# Patient Record
Sex: Male | Born: 1955 | Race: White | Hispanic: No | Marital: Married | Smoking: Never smoker
Health system: Southern US, Community
[De-identification: ages and names within clinical notes are randomized; demographics above are authoritative.]

## PROBLEM LIST (undated history)

## (undated) DIAGNOSIS — I10 Essential (primary) hypertension: Secondary | ICD-10-CM

## (undated) DIAGNOSIS — C9000 Multiple myeloma not having achieved remission: Secondary | ICD-10-CM

## (undated) DIAGNOSIS — E119 Type 2 diabetes mellitus without complications: Secondary | ICD-10-CM

## (undated) DIAGNOSIS — E7800 Pure hypercholesterolemia, unspecified: Secondary | ICD-10-CM

## (undated) DIAGNOSIS — I4891 Unspecified atrial fibrillation: Secondary | ICD-10-CM

## (undated) HISTORY — DX: Unspecified atrial fibrillation: I48.91

## (undated) HISTORY — DX: Multiple myeloma not having achieved remission: C90.00

## (undated) HISTORY — DX: Pure hypercholesterolemia, unspecified: E78.00

---

## 2004-11-07 ENCOUNTER — Ambulatory Visit: Payer: Self-pay | Admitting: Internal Medicine

## 2005-01-20 ENCOUNTER — Ambulatory Visit: Payer: Self-pay | Admitting: Internal Medicine

## 2009-10-03 ENCOUNTER — Emergency Department: Payer: Self-pay

## 2011-05-14 ENCOUNTER — Emergency Department: Payer: Self-pay | Admitting: Emergency Medicine

## 2017-10-27 ENCOUNTER — Emergency Department
Admission: EM | Admit: 2017-10-27 | Discharge: 2017-10-28 | Disposition: A | Discharge status: Home / Self Care | Payer: BLUE CROSS/BLUE SHIELD | Attending: Emergency Medicine | Admitting: Emergency Medicine

## 2017-10-27 ENCOUNTER — Emergency Department: Payer: BLUE CROSS/BLUE SHIELD

## 2017-10-27 ENCOUNTER — Other Ambulatory Visit: Payer: Self-pay

## 2017-10-27 DIAGNOSIS — J101 Influenza due to other identified influenza virus with other respiratory manifestations: Secondary | ICD-10-CM | POA: Insufficient documentation

## 2017-10-27 DIAGNOSIS — R2681 Unsteadiness on feet: Secondary | ICD-10-CM | POA: Diagnosis not present

## 2017-10-27 DIAGNOSIS — E869 Volume depletion, unspecified: Secondary | ICD-10-CM

## 2017-10-27 DIAGNOSIS — R509 Fever, unspecified: Secondary | ICD-10-CM | POA: Diagnosis not present

## 2017-10-27 DIAGNOSIS — I10 Essential (primary) hypertension: Secondary | ICD-10-CM | POA: Insufficient documentation

## 2017-10-27 DIAGNOSIS — R823 Hemoglobinuria: Secondary | ICD-10-CM | POA: Diagnosis not present

## 2017-10-27 DIAGNOSIS — E119 Type 2 diabetes mellitus without complications: Secondary | ICD-10-CM | POA: Diagnosis not present

## 2017-10-27 DIAGNOSIS — Z9181 History of falling: Secondary | ICD-10-CM | POA: Diagnosis not present

## 2017-10-27 DIAGNOSIS — R05 Cough: Secondary | ICD-10-CM | POA: Diagnosis present

## 2017-10-27 DIAGNOSIS — R531 Weakness: Secondary | ICD-10-CM

## 2017-10-27 HISTORY — DX: Essential (primary) hypertension: I10

## 2017-10-27 HISTORY — DX: Type 2 diabetes mellitus without complications: E11.9

## 2017-10-27 LAB — CBC
HEMATOCRIT: 41.7 % (ref 40.0–52.0)
Hemoglobin: 14.2 g/dL (ref 13.0–18.0)
MCH: 30.1 pg (ref 26.0–34.0)
MCHC: 34 g/dL (ref 32.0–36.0)
MCV: 88.6 fL (ref 80.0–100.0)
Platelets: 192 10*3/uL (ref 150–440)
RBC: 4.7 MIL/uL (ref 4.40–5.90)
RDW: 13.2 % (ref 11.5–14.5)
WBC: 5.1 10*3/uL (ref 3.8–10.6)

## 2017-10-27 LAB — URINALYSIS, COMPLETE (UACMP) WITH MICROSCOPIC
BACTERIA UA: NONE SEEN
BILIRUBIN URINE: NEGATIVE
Glucose, UA: NEGATIVE mg/dL
KETONES UR: 20 mg/dL — AB
Leukocytes, UA: NEGATIVE
NITRITE: NEGATIVE
PH: 5 (ref 5.0–8.0)
Protein, ur: 100 mg/dL — AB
Specific Gravity, Urine: 1.018 (ref 1.005–1.030)

## 2017-10-27 LAB — BASIC METABOLIC PANEL
ANION GAP: 12 (ref 5–15)
BUN: 28 mg/dL — ABNORMAL HIGH (ref 6–20)
CHLORIDE: 96 mmol/L — AB (ref 101–111)
CO2: 22 mmol/L (ref 22–32)
Calcium: 9.1 mg/dL (ref 8.9–10.3)
Creatinine, Ser: 1.24 mg/dL (ref 0.61–1.24)
GFR calc non Af Amer: >60 mL/min (ref >60)
Glucose, Bld: 181 mg/dL — ABNORMAL HIGH (ref 65–99)
POTASSIUM: 4.5 mmol/L (ref 3.5–5.1)
Sodium: 130 mmol/L — ABNORMAL LOW (ref 135–145)

## 2017-10-27 LAB — GLUCOSE, CAPILLARY: Glucose-Capillary: 166 mg/dL — ABNORMAL HIGH (ref 65–99)

## 2017-10-27 LAB — TROPONIN I

## 2017-10-27 NOTE — ED Triage Notes (Signed)
Pt states he fell 3 times today and that it was because his blood pressure and blood sugar were high - he states he has also had a cough with congestion that his PCP called in an ATB for that he is currently taking - the pt had unsteady gait when walking to the triage room and almost fell

## 2017-10-28 ENCOUNTER — Emergency Department: Payer: BLUE CROSS/BLUE SHIELD

## 2017-10-28 LAB — INFLUENZA PANEL BY PCR (TYPE A & B)
INFLAPCR: POSITIVE — AB
Influenza B By PCR: NEGATIVE

## 2017-10-28 MED ORDER — SODIUM CHLORIDE 0.9 % IV BOLUS (SEPSIS)
500.0000 mL | INTRAVENOUS | Status: AC
  Administered 2017-10-28: 500 mL via INTRAVENOUS

## 2017-10-28 NOTE — ED Provider Notes (Signed)
[facility] Emergency Department Provider Note  ____________________________________________   First MD Initiated Contact with Patient 10/28/17 0018     (approximate)  I have reviewed the triage vital signs and the nursing notes.   HISTORY  Chief Complaint Fall; Hypertension; and Hyperglycemia    HPI Victor Phillips is a 62 y.o. male who presents for evaluation of a variety of complaints.  He and his wife state that for the last several days he has had a cough, nasal congestion, runny nose, and general malaise.  He had a fever in triage and states that he has felt some fever and chills at home.  He has felt off balance for the last couple of days and today he states that he fell or lost his balance 3 times but he believes that is because his blood sugar and his blood pressure were high.  His primary care doctor called in a prescription for Augmentin because of the cough and he took 1 dose this afternoon.  He states that he feels better now and is lying in the bed in no acute distress with his hands behind his head, joking and interacting with me appropriately.  He denies chest pain, shortness of breath, abdominal pain, nausea, vomiting, and dysuria.  He has had hiccups since taking his first dose of Augmentin which was approximately 8 hours ago.  He has felt off balance for a couple of days but has had no focal numbness nor weakness in any of his extremities and has not had any difficulty speaking and no confusion according to him and his wife.  Past Medical History:  Diagnosis Date  . Diabetes mellitus without complication (HCC)   . Hypertension     There are no active problems to display for this patient.   History reviewed. No pertinent surgical history.  Prior to Admission medications   Not on File    Allergies Patient has no known allergies.  No family history on file.  Social History Social History   Tobacco Use  . Smoking status: Never  Smoker  . Smokeless tobacco: Never Used  Substance Use Topics  . Alcohol use: No    Frequency: Never  . Drug use: No    Review of Systems Constitutional: +fever/chills Eyes: No visual changes. ENT: No sore throat.  Nasal congestion and runny nose for several days Cardiovascular: Denies chest pain. Respiratory: Denies shortness of breath.  Frequent mild cough Gastrointestinal: No abdominal pain.  No nausea, no vomiting.  No diarrhea.  No constipation. Genitourinary: Negative for dysuria. Musculoskeletal: Negative for neck pain.  Negative for back pain. Integumentary: Negative for rash. Neurological: Patient has felt off balance for a couple of days and had some non-injurous falls.  Negative for headaches, focal weakness or numbness.   ____________________________________________   PHYSICAL EXAM:  VITAL SIGNS: ED Triage Vitals  Enc Vitals Group     BP 10/27/17 1913 (!) 157/87     Pulse Rate 10/27/17 1913 (!) 119     Resp 10/27/17 1913 (!) 22     Temp 10/27/17 1913 (!) 102.6 F (39.2 C)     Temp Source 10/27/17 1913 Oral     SpO2 10/27/17 1913 96 %     Weight 10/27/17 1914 80.7 kg (178 lb)     Height 10/27/17 1914 1.753 m (5\' 9" )     Head Circumference --      Peak Flow --      Pain Score 10/27/17 1913 5  Pain Loc --      Pain Edu? --      Excl. in GC? --     Constitutional: Alert and oriented. Well appearing and in no acute distress. Eyes: Conjunctivae are normal. PERRL. EOMI. Head: Atraumatic. Nose: +congestion/rhinnorhea. Mouth/Throat: Mucous membranes are moist.  Oropharynx non-erythematous. Neck: No stridor.  No meningeal signs.   Cardiovascular: Borderline tachycardia regular rhythm. Good peripheral circulation. Grossly normal heart sounds. Respiratory: Normal respiratory effort.  No retractions. Lungs CTAB. Gastrointestinal: Soft and nontender. No distention.  Musculoskeletal: No lower extremity tenderness nor edema. No gross deformities of  extremities. Neurologic:  Normal speech and language. No gross focal neurologic deficits are appreciated.  Skin:  Skin is warm, dry and intact. No rash noted.  He has a healing wound on his right hand that he states was a spider bite but there is no sign of active infection at this time Psychiatric: Mood and affect are normal. Speech and behavior are normal.  ____________________________________________   LABS (all labs ordered are listed, but only abnormal results are displayed)  Labs Reviewed  GLUCOSE, CAPILLARY - Abnormal; Notable for the following components:      Result Value   Glucose-Capillary 166 (*)    All other components within normal limits  BASIC METABOLIC PANEL - Abnormal; Notable for the following components:   Sodium 130 (*)    Chloride 96 (*)    Glucose, Bld 181 (*)    BUN 28 (*)    All other components within normal limits  URINALYSIS, COMPLETE (UACMP) WITH MICROSCOPIC - Abnormal; Notable for the following components:   Color, Urine YELLOW (*)    APPearance CLEAR (*)    Hgb urine dipstick MODERATE (*)    Ketones, ur 20 (*)    Protein, ur 100 (*)    Squamous Epithelial / LPF 0-5 (*)    All other components within normal limits  INFLUENZA PANEL BY PCR (TYPE A & B) - Abnormal; Notable for the following components:   Influenza A By PCR POSITIVE (*)    All other components within normal limits  CBC  TROPONIN I  CBG MONITORING, ED   ____________________________________________  EKG  ED ECG REPORT I, Loleta Roseory Assunta Pupo, the attending physician, personally viewed and interpreted this ECG.  Date: 10/27/2017 EKG Time: 19:25 Rate: 117 Rhythm: sinus tachycardia QRS Axis: normal Intervals: normal ST/T Wave abnormalities: Non-specific ST segment / T-wave changes, but no evidence of acute ischemia. Narrative Interpretation: no evidence of acute ischemia, some artifact in V3   ____________________________________________  RADIOLOGY I, Loleta Roseory Anvita Hirata, personally  viewed and evaluated these images (plain radiographs) as part of my medical decision making, as well as reviewing the written report by the radiologist.  ED MD interpretation:  No acute infiltrates  Official radiology report(s): Dg Chest 2 View  Result Date: 10/27/2017 CLINICAL DATA:  Fall. EXAM: CHEST  2 VIEW COMPARISON:  10/03/2009 FINDINGS: The lungs are clear without focal pneumonia, edema, pneumothorax or pleural effusion. The cardiopericardial silhouette is within normal limits for size. The visualized bony structures of the thorax are intact. IMPRESSION: No active cardiopulmonary disease. Electronically Signed   By: Kennith CenterEric  Mansell M.D.   On: 10/27/2017 20:24   Ct Head Wo Contrast  Result Date: 10/28/2017 CLINICAL DATA:  Larey SeatFell 3 times today. Unsteady gait. Hypertensive and hyperglycemic. Assess for stroke. History of hypertension and diabetes. EXAM: CT HEAD WITHOUT CONTRAST TECHNIQUE: Contiguous axial images were obtained from the base of the skull through the vertex without intravenous contrast.  COMPARISON:  CT HEAD October 03, 2009 FINDINGS: BRAIN: No intraparenchymal hemorrhage, mass effect nor midline shift. Borderline parenchymal brain volume loss. No hydrocephalus. Minimal supratentorial white matter hypodensities within normal range for patient's age, though non-specific are most compatible with chronic small vessel ischemic disease. No acute large vascular territory infarcts. No abnormal extra-axial fluid collections. Basal cisterns are patent. VASCULAR: Mild calcific atherosclerosis of the carotid siphons. SKULL: No skull fracture. Old nasal bone fractures. No significant scalp soft tissue swelling. SINUSES/ORBITS: The mastoid air-cells and included paranasal sinuses are well-aerated.The included ocular globes and orbital contents are non-suspicious. OTHER: None. IMPRESSION: 1. No acute intracranial process. 2. Borderline parenchymal brain volume loss. Electronically Signed   By: Awilda Metro M.D.   On: 10/28/2017 01:09    ____________________________________________   PROCEDURES  Critical Care performed: No   Procedure(s) performed:   Procedures   ____________________________________________   INITIAL IMPRESSION / ASSESSMENT AND PLAN / ED COURSE  As part of my medical decision making, I reviewed the following data within the electronic MEDICAL RECORD NUMBER Nursing notes reviewed and incorporated, Labs reviewed , EKG interpreted , Radiograph reviewed  and Notes from prior ED visits    Differential diagnosis includes, but is not limited to, CVA, acute infection such as pneumonia, UTI, or viral infection including influenza, ACS, intracranial hemorrhage.  The patient is very well-appearing at this time once his fever came down but is still borderline tachycardic.  He has no acute infiltrate on his chest x-ray and his lungs are clear.  He has the hiccups but this started after taking some Augmentin.  He has no chest pain or shortness of breath.  He could have been ataxic due to some volume depletion and viral illness/fever, but I will check a CT scan of the head; he has no focal neuro deficits at this time, and if he has had symptoms for several days due to a CVA, I would anticipate seeing some abnormality on his CT.  If after a small IV fluid bolus and influenza testing he remains ataxic when we test him, he may benefit from an MRI, but I do not think it is necessary at this time.  The patient and his wife are comfortable with the plan for outpatient follow-up given a generally reassuring workup with a metabolic panel notable for mild hyponatremia and slightly elevated BUN which I think is related to volume depletion and some ketones in his urine.  Again I think that he will benefit from fluids.  He does have some hemoglobin in his urine and I will recommend that he follow-up with urology but he has no abdominal pain or flank pain to suggest renal colic or  ureterolithiasis.  Clinical Course as of Oct 28 136  Fri Oct 28, 2017  0124 Influenza A By PCR: (!) POSITIVE [CF]  0125 No acute intracranial process CT Head Wo Contrast [CF]  0125 Strongly suspect mild volume depletion and +Flu as the cause of the patient's symptoms.  Updated patient and wife, gave usual recommendations/return precautions.  They understand and agree with the plan.  [CF]    Clinical Course User Index [CF] Loleta Rose, MD    ____________________________________________  FINAL CLINICAL IMPRESSION(S) / ED DIAGNOSES  Final diagnoses:  Fever, unspecified fever cause  Hemoglobinuria  Influenza A  Generalized weakness  Volume depletion     MEDICATIONS GIVEN DURING THIS VISIT:  Medications  sodium chloride 0.9 % bolus 500 mL (500 mLs Intravenous New Bag/Given 10/28/17 0044)  ED Discharge Orders    None       Note:  This document was prepared using Dragon voice recognition software and may include unintentional dictation errors.    Loleta Rose, MD 10/28/17 [phone]

## 2017-10-28 NOTE — Discharge Instructions (Signed)
You were diagnosed with the flu (influenza).  You will feel ill for as much as a few weeks.  Please take any prescribed medications as instructed, and you may use over-the-counter Tylenol and/or ibuprofen as needed according to label instructions (unless you have an allergy to either or have been told by your doctor not to take them).  Please make sure to drink plenty of fluids and refer to the included information about rehydration.  Follow up with your physician as instructed above, and return to the Emergency Department (ED) if you are unable to tolerate fluids due to vomiting, have worsening trouble breathing, become extremely tired or difficult to awaken, or if you develop any other symptoms that concern you. 

## 2017-10-28 NOTE — ED Notes (Addendum)
Pt states he was sent here from work because of his blood pressure and blood sugar. Pt had a temp of 102.6 on arrival. He has a treated spider bite on his right hand that is swollen and red. Pt states he was on 2 rounds of abx for that about 1 month ago. Temp now is 99.8  Pt has had hiccups since 4pm 2/14

## 2018-01-25 ENCOUNTER — Emergency Department: Payer: BLUE CROSS/BLUE SHIELD

## 2018-01-25 ENCOUNTER — Other Ambulatory Visit: Payer: Self-pay

## 2018-01-25 ENCOUNTER — Emergency Department
Admission: EM | Admit: 2018-01-25 | Discharge: 2018-01-26 | Disposition: A | Discharge status: Home / Self Care | Payer: BLUE CROSS/BLUE SHIELD | Attending: Emergency Medicine | Admitting: Emergency Medicine

## 2018-01-25 DIAGNOSIS — I10 Essential (primary) hypertension: Secondary | ICD-10-CM | POA: Diagnosis not present

## 2018-01-25 DIAGNOSIS — R1110 Vomiting, unspecified: Secondary | ICD-10-CM | POA: Diagnosis not present

## 2018-01-25 DIAGNOSIS — R51 Headache: Secondary | ICD-10-CM | POA: Insufficient documentation

## 2018-01-25 DIAGNOSIS — E119 Type 2 diabetes mellitus without complications: Secondary | ICD-10-CM | POA: Diagnosis not present

## 2018-01-25 DIAGNOSIS — R2681 Unsteadiness on feet: Secondary | ICD-10-CM | POA: Diagnosis not present

## 2018-01-25 DIAGNOSIS — R4182 Altered mental status, unspecified: Secondary | ICD-10-CM | POA: Diagnosis present

## 2018-01-25 DIAGNOSIS — R410 Disorientation, unspecified: Secondary | ICD-10-CM | POA: Insufficient documentation

## 2018-01-25 LAB — COMPREHENSIVE METABOLIC PANEL
ALBUMIN: 4.1 g/dL (ref 3.5–5.0)
ALK PHOS: 84 U/L (ref 38–126)
ALT: 26 U/L (ref 17–63)
ANION GAP: 8 (ref 5–15)
AST: 21 U/L (ref 15–41)
BILIRUBIN TOTAL: 0.4 mg/dL (ref 0.3–1.2)
BUN: 27 mg/dL — ABNORMAL HIGH (ref 6–20)
CALCIUM: 9.5 mg/dL (ref 8.9–10.3)
CO2: 27 mmol/L (ref 22–32)
Chloride: 99 mmol/L — ABNORMAL LOW (ref 101–111)
Creatinine, Ser: 1.21 mg/dL (ref 0.61–1.24)
GFR calc Af Amer: >60 mL/min (ref >60)
GFR calc non Af Amer: >60 mL/min (ref >60)
GLUCOSE: 256 mg/dL — AB (ref 65–99)
Potassium: 4.1 mmol/L (ref 3.5–5.1)
SODIUM: 134 mmol/L — AB (ref 135–145)
TOTAL PROTEIN: 8 g/dL (ref 6.5–8.1)

## 2018-01-25 LAB — CBC
HCT: 39.6 % — ABNORMAL LOW (ref 40.0–52.0)
HEMOGLOBIN: 13.9 g/dL (ref 13.0–18.0)
MCH: 30.7 pg (ref 26.0–34.0)
MCHC: 35.2 g/dL (ref 32.0–36.0)
MCV: 87.4 fL (ref 80.0–100.0)
Platelets: 224 10*3/uL (ref 150–440)
RBC: 4.53 MIL/uL (ref 4.40–5.90)
RDW: 13.5 % (ref 11.5–14.5)
WBC: 9.2 10*3/uL (ref 3.8–10.6)

## 2018-01-25 NOTE — ED Provider Notes (Signed)
[facility] Emergency Department Provider Note  ____________________________________________   First MD Initiated Contact with Patient 01/25/18 2255     (approximate)  I have reviewed the triage vital signs and the nursing notes.   HISTORY  Chief Complaint Altered Mental Status    HPI Victor Phillips is a 62 y.o. male who is generally healthy who presents for evaluation of a variety of complaints over the last 2 to 3 days.  The patient is minimizing his symptoms, but his wife states that over the last 2 to 3 days the patient has been off his baseline.  He has had multiple episodes of vomiting, mostly yesterday, and over the last 2 days he has had a severe headache although that improved over the course of the day yesterday and was gradual in onset and located mostly in the frontal right side of his head.  For about an hour yesterday afternoon the patient was not able to communicate clearly with his wife.  She said that he would be trying to say a simple sentence to her but extra words or the wrong words were inserted into his sentence.  He was speaking clearly but the words were all jumbled up and inappropriate words for the contacts were being used.  This seemed to improve also after about an hour but his wife was very concerned about the symptoms.  He has been unsteady of gait at times but that has been going on occasionally for about the last month and did not seem worse over the last 2 days.  He has had no shortness of breath, fever/chills, chest pain, abdominal pain, constipation, diarrhea, nor dysuria.  He denies any numbness nor weakness in his extremities.  The most severe, acute in onset, and worrisome symptom for his wife was the jumbled speech that occurred yesterday afternoon.  He has never had anything like it.  He does not take any blood thinners, has no history of CVA/TIA, and has no cardiac history.   Past Medical History:  Diagnosis Date  . Diabetes  mellitus without complication (HCC)   . Hypertension     There are no active problems to display for this patient.   History reviewed. No pertinent surgical history.  Prior to Admission medications   Not on File    Allergies Patient has no known allergies.  No family history on file.  Social History Social History   Tobacco Use  . Smoking status: Never Smoker  . Smokeless tobacco: Never Used  Substance Use Topics  . Alcohol use: No    Frequency: Never  . Drug use: No    Review of Systems Constitutional: No fever/chills Eyes: No visual changes. ENT: No sore throat. Cardiovascular: Denies chest pain. Respiratory: Denies shortness of breath. Gastrointestinal: No abdominal pain.  Multiple episodes of vomiting yesterday.  No diarrhea.  No constipation. Genitourinary: Negative for dysuria. Musculoskeletal: Negative for neck pain.  Negative for back pain. Integumentary: Negative for rash. Neurological: Headache, aphasia, and intermittent ataxia as described above.  No focal numbness nor weakness.   ____________________________________________   PHYSICAL EXAM:  VITAL SIGNS: ED Triage Vitals  Enc Vitals Group     BP 01/25/18 1906 (!) 158/134     Pulse Rate 01/25/18 1906 83     Resp 01/25/18 1906 18     Temp 01/25/18 1906 98.3 F (36.8 C)     Temp Source 01/25/18 1906 Oral     SpO2 01/25/18 1906 97 %  Weight 01/25/18 1907 83.5 kg (184 lb)     Height 01/25/18 1907 1.727 m ( )     Head Circumference --      Peak Flow --      Pain Score 01/25/18 1907 8     Pain Loc --      Pain Edu? --      Excl. in GC? --     Constitutional: Alert and oriented to place, month and year (but with hesitation and required significant amount of thought and consideration), and situation.  Generally well appearing and in no acute distress. Eyes: Conjunctivae are normal. PERRL. EOMI. Head: Atraumatic. Nose: No congestion/rhinnorhea. Mouth/Throat: Mucous membranes are  moist. Neck: No stridor.  No meningeal signs.   Cardiovascular: Normal rate, regular rhythm. Good peripheral circulation. Grossly normal heart sounds. Respiratory: Normal respiratory effort.  No retractions. Lungs CTAB. Gastrointestinal: Soft and nontender. No distention.  Musculoskeletal: No lower extremity tenderness nor edema. No gross deformities of extremities. Neurologic:  Normal speech and language with no word finding difficulties and no dysarthria. No gross focal neurologic deficits are appreciated; the patient has equal and normal grip strength bilaterally, normal major muscle group strength in arms and legs, no facial droop, no subjective sensory deficits, negative pronator drift, negative Romberg, no ataxia at this time. Skin:  Skin is warm, dry and intact. No rash noted. Psychiatric: Mood and affect are normal. Speech and behavior are normal.  ____________________________________________   LABS (all labs ordered are listed, but only abnormal results are displayed)  Labs Reviewed  COMPREHENSIVE METABOLIC PANEL - Abnormal; Notable for the following components:      Result Value   Sodium 134 (*)    Chloride 99 (*)    Glucose, Bld 256 (*)    BUN 27 (*)    All other components within normal limits  CBC - Abnormal; Notable for the following components:   HCT 39.6 (*)    All other components within normal limits  URINALYSIS, ROUTINE W REFLEX MICROSCOPIC - Abnormal; Notable for the following components:   Color, Urine YELLOW (*)    APPearance CLEAR (*)    Glucose, UA 50 (*)    Hgb urine dipstick SMALL (*)    All other components within normal limits  ETHANOL  PROTIME-INR  URINE DRUG SCREEN, QUALITATIVE ([facility] ONLY)  TROPONIN I  CBG MONITORING, ED   ____________________________________________  EKG  ED ECG REPORT I, Loleta Rose, the attending physician, personally viewed and interpreted this ECG.  Date: 01/25/2018 EKG Time: 23: 32 Rate: 74 Rhythm: normal sinus  rhythm QRS Axis: normal Intervals: normal ST/T Wave abnormalities: Non-specific ST segment / T-wave changes, but no evidence of acute ischemia.  Specifically there is some T wave inversions in the lateral leads as well as 3 and aVF.  These nonspecific changes do appear different from his last EKG from 3 months ago. Narrative Interpretation: no evidence of acute ischemia   ____________________________________________  RADIOLOGY   ED MD interpretation:  Normal/non-acute head CT.  Patient could not tolerate MRI.  Official radiology report(s): Ct Head Wo Contrast  Result Date: 01/25/2018 CLINICAL DATA:  Altered level of consciousness, unexplained EXAM: CT HEAD WITHOUT CONTRAST TECHNIQUE: Contiguous axial images were obtained from the base of the skull through the vertex without intravenous contrast. COMPARISON:  10/28/2017 FINDINGS: Brain: No evidence of acute infarction, hemorrhage, hydrocephalus, extra-axial collection or mass lesion/mass effect. Vascular: No hyperdense vessel or unexpected calcification. Skull: Normal. Negative for fracture or focal lesion. Sinuses/Orbits: No acute  finding. IMPRESSION: Negative head CT. Electronically Signed   By: Marnee Spring M.D.   On: 01/25/2018 19:50    ____________________________________________   PROCEDURES  Critical Care performed: No   Procedure(s) performed:   Procedures   ____________________________________________   INITIAL IMPRESSION / ASSESSMENT AND PLAN / ED COURSE  As part of my medical decision making, I reviewed the following data within the electronic MEDICAL RECORD NUMBER Nursing notes reviewed and incorporated, Labs reviewed , EKG interpreted , Old EKG reviewed, Old chart reviewed and Notes from prior ED visits    Differential diagnosis includes, but is not limited to, CVA/TIA, delirium secondary to an infectious process such as a urinary tract infection, much less likely ACS, also possible patient is having some signs of  dementia.  Vital signs are normal except for hypertension which could be in response to a subacute CVA.  CMP is generally reassuring with no acute abnormalities, CBC is normal.  I have added on ethanol, pro time-INR, and urinalysis as well as a urine drug screen.  I have ordered MR brain without contrast to evaluate for subacute CVA.  The patient does not want to be here and wants to go home but I explained to him and his wife my concerns about a CVA and they agreed to the testing.  In theory he could have had a TIA but I will reassess his clinical status once we get the results of the MRI.  If it is normal he may be appropriate for him to follow-up as an outpatient rather than coming in for stroke/TIA screen.  He does not meet criteria for a code stroke given the symptoms been going on for several days and there is no known time of onset and in spite of the report of his symptoms over the last couple of days, he currently has an NIH stroke scale of 0.  He is clearly not a candidate for TPA.  I will reassess after the results of the MRI and the rest of the testing as described above.  I have also asked the nurses to perform a stroke swallow screen.    Clinical Course as of Jan 27 247  Thu Jan 26, 2018  0032 No indication of acute infection in the UA.  Urinalysis, Routine w reflex microscopic(!) [CF]  0050 The patient adamantly refuses to get the MRI.  He was not able to tolerate due to claustrophobia.  The nurse talked to him about it and then I went back and discussed again the need for it, my strong recommendation for it, and my willingness to work with him by giving him any 1 of a number of medications to help him relax.  He has absolutely adamant that he will not have the test done under any circumstances, and given that he has the capacity to make his own decisions, I cannot make him do so.  I made it clear to him and his wife that he is refusing this AGAINST MEDICAL ADVICE and that I cannot tell for  sure if he is having a stroke which can lead to long-term health consequences including disability or even death.  They understand and agree.  Fortunately he is asymptomatic at this time and should be appropriate for outpatient follow-up.  Once his troponin and ethanol results come back I will discharge him to follow-up as an outpatient but I gave strict return precautions that he should come back if he develops any new or worsening symptoms.  He and his  wife understand and agree.   [CF]  0059 Alcohol, Ethyl (B): <10 [CF]  0059 Negative UDS  Urine Drug Screen, Qualitative [CF]  0104 All the rest of his results are normal.  Will discharge as described above with return precautions.  Also ordered full dose ASA for administration prior to discharge.  Troponin I: <0.03 [CF]    Clinical Course User Index [CF] Loleta Rose, MD    ____________________________________________  FINAL CLINICAL IMPRESSION(S) / ED DIAGNOSES  Final diagnoses:  Confusion     MEDICATIONS GIVEN DURING THIS VISIT:  Medications  aspirin chewable tablet 324 mg (324 mg Oral Given 01/26/18 0115)     ED Discharge Orders    None       Note:  This document was prepared using Dragon voice recognition software and may include unintentional dictation errors.    Loleta Rose, MD 01/26/18 [phone]

## 2018-01-25 NOTE — ED Triage Notes (Signed)
Pt arrives to ED via POV from home with c/o AMS since 130pm this afternoon. Pt's wife states the pt had a period of time where "he was talking but not saying the right words". Pt also reports 4-5 episodes of vomiting that began around the same time, and HA x2 days. Pt also reports "a month or two" of unsteady gait. Pt denies any c/o numbness or weakness in the extremities, no trouble swallowing or breathing, no facial droop noted. Pt MAEW, grips are strong and equal bilaterally.

## 2018-01-26 ENCOUNTER — Emergency Department: Payer: BLUE CROSS/BLUE SHIELD

## 2018-01-26 LAB — URINE DRUG SCREEN, QUALITATIVE (ARMC ONLY)
Amphetamines, Ur Screen: NOT DETECTED
BARBITURATES, UR SCREEN: NOT DETECTED
Benzodiazepine, Ur Scrn: NOT DETECTED
CANNABINOID 50 NG, UR ~~LOC~~: NOT DETECTED
COCAINE METABOLITE, UR ~~LOC~~: NOT DETECTED
MDMA (ECSTASY) UR SCREEN: NOT DETECTED
Methadone Scn, Ur: NOT DETECTED
OPIATE, UR SCREEN: NOT DETECTED
PHENCYCLIDINE (PCP) UR S: NOT DETECTED
TRICYCLIC, UR SCREEN: NOT DETECTED

## 2018-01-26 LAB — URINALYSIS, ROUTINE W REFLEX MICROSCOPIC
Bacteria, UA: NONE SEEN
Bilirubin Urine: NEGATIVE
Glucose, UA: 50 mg/dL — AB
Ketones, ur: NEGATIVE mg/dL
Leukocytes, UA: NEGATIVE
Nitrite: NEGATIVE
Protein, ur: NEGATIVE mg/dL
Specific Gravity, Urine: 1.019 (ref 1.005–1.030)
pH: 5 (ref 5.0–8.0)

## 2018-01-26 LAB — PROTIME-INR
INR: 0.99
Prothrombin Time: 13 seconds (ref 11.4–15.2)

## 2018-01-26 LAB — TROPONIN I

## 2018-01-26 LAB — ETHANOL

## 2018-01-26 MED ORDER — ASPIRIN 81 MG PO CHEW
324.0000 mg | CHEWABLE_TABLET | Freq: Once | ORAL | Status: AC
  Administered 2018-01-26: 324 mg via ORAL
  Filled 2018-01-26: qty 4

## 2018-01-26 NOTE — ED Notes (Signed)
EDP notified pt was not able to tolerate MRI, EDP in rm at this time.

## 2018-01-26 NOTE — ED Notes (Signed)
Patient transported to MRI 

## 2018-01-26 NOTE — ED Notes (Signed)

## 2018-01-26 NOTE — Discharge Instructions (Signed)
As we discussed, your work-up today was reassuring, but your symptoms are little bit concerning that you may have had a minor stroke even though the symptoms have completely resolved.  We ordered an MRI to check your brain for any signs of a stroke, but you were unable to tolerate going into the scanner and did not want to try again even with sedation medication.  We strongly encourage you to follow-up with your regular doctor at the next available opportunity to discuss your symptoms with him.  We provided a full dose aspirin tonight and encourage you to take a baby aspirin every day at least until you can follow-up with your doctor.  Return to the emergency department if you develop new or worsening symptoms that concern you.

## 2018-07-26 IMAGING — CT CT HEAD W/O CM
3 series · 16 of 47 positions shown, 19 images · non-contrast
Comparison: 10/28/2017

CLINICAL DATA: Altered level of consciousness, unexplained

EXAM:
CT HEAD WITHOUT CONTRAST
TECHNIQUE: Contiguous axial images were obtained from the base of the skull
through the vertex without intravenous contrast.

[Series 2: head wo · axial · 0.42mm/px · z∈[+512,+642]mm · 10 of 32 slices shown, 13 images]
[im 3/32  brain]
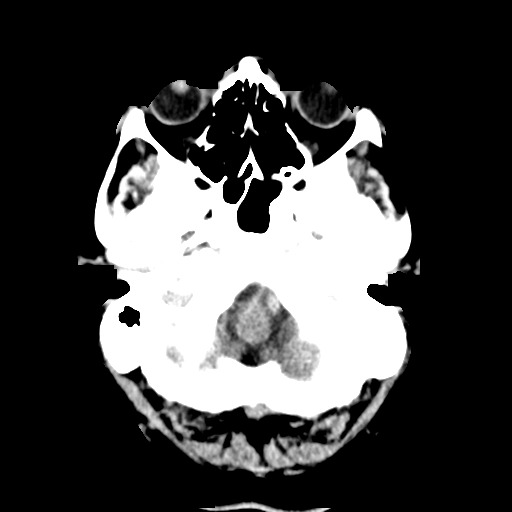
[im 3/32  bone]
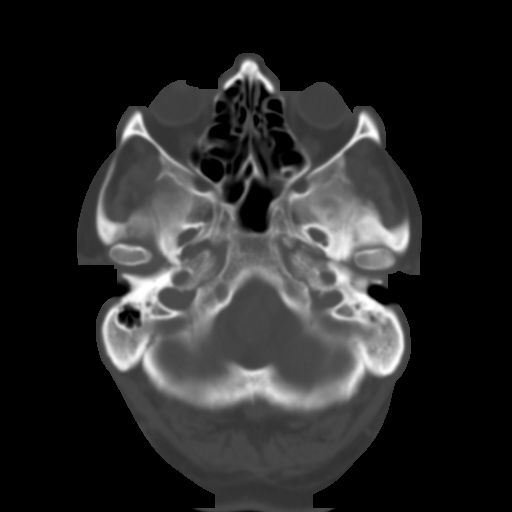
[im 6/32  brain]
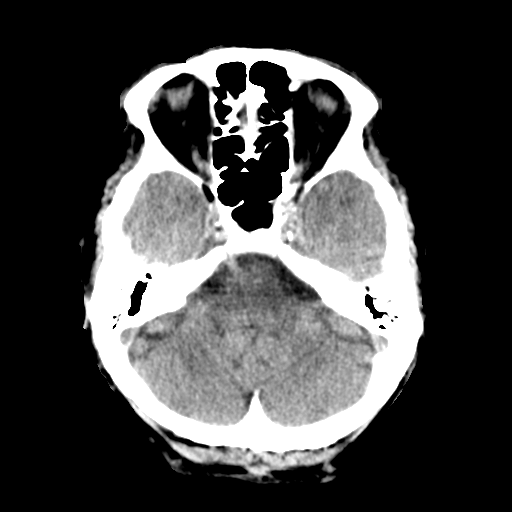
[im 9/32  brain]
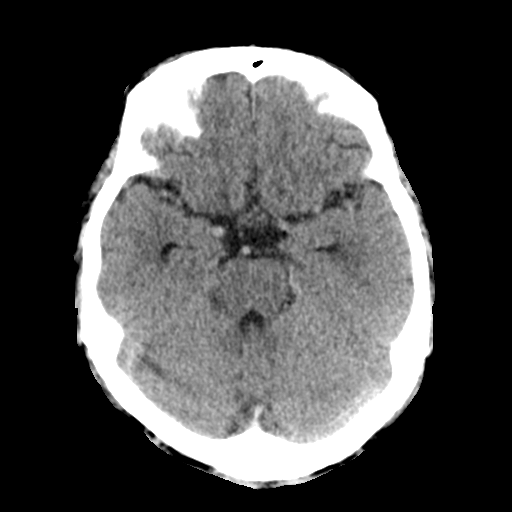
[im 11/32  brain]
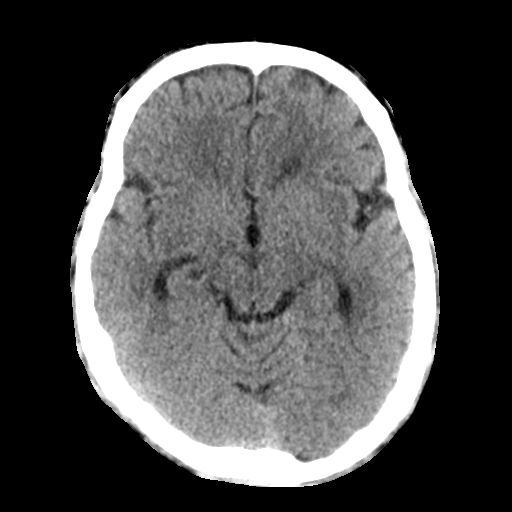
[im 14/32  brain]
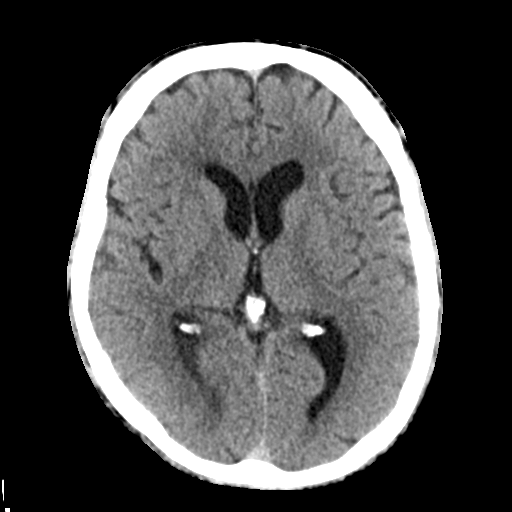
[im 14/32  bone]
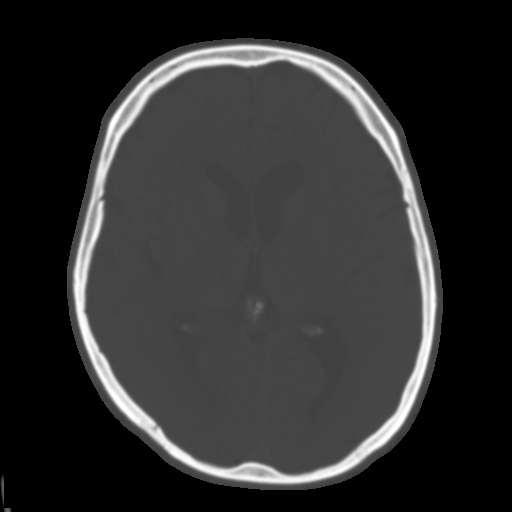
[im 18/32  brain]
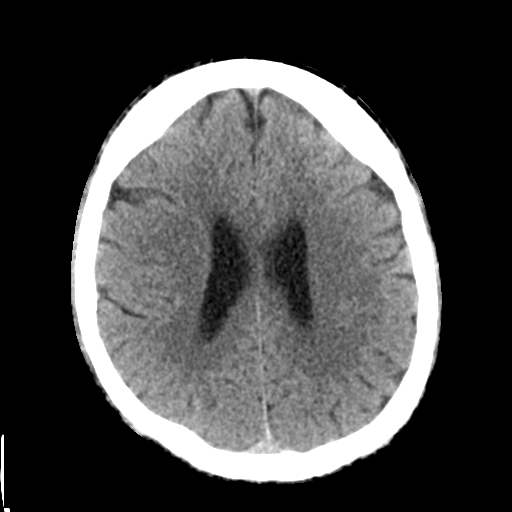
[im 21/32  brain]
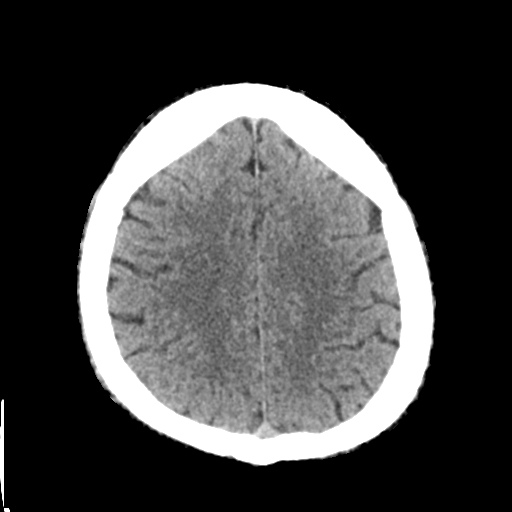
[im 24/32  brain]
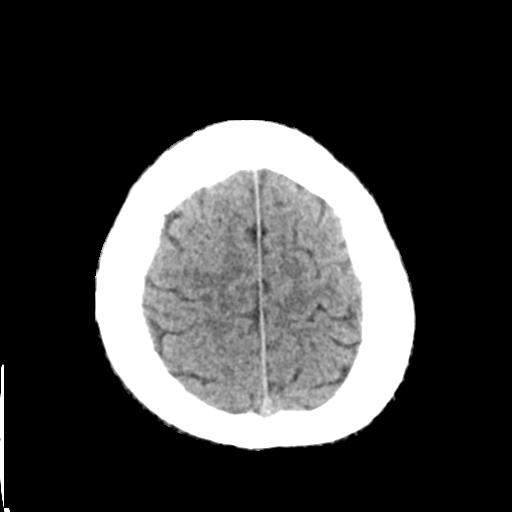
[im 26/32  brain]
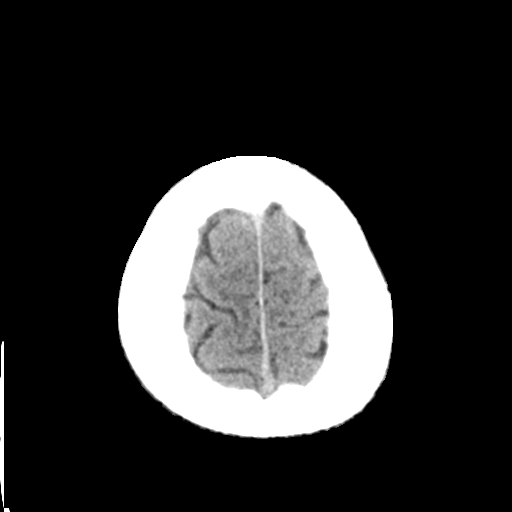
[im 26/32  bone]
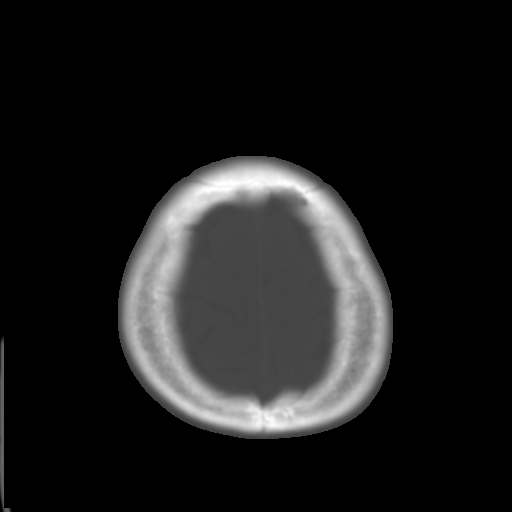
[im 29/32  brain]
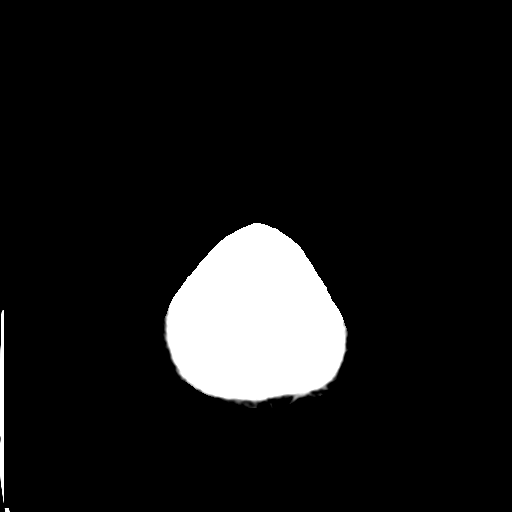

[Series 4: coronal soft tissue · coronal · 0.32mm/px · 3 of 61 slices shown]
[im 21/61  brain]
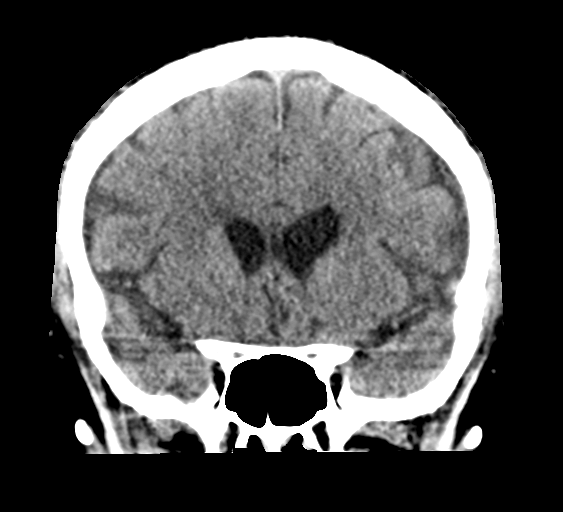
[im 27/61  brain]
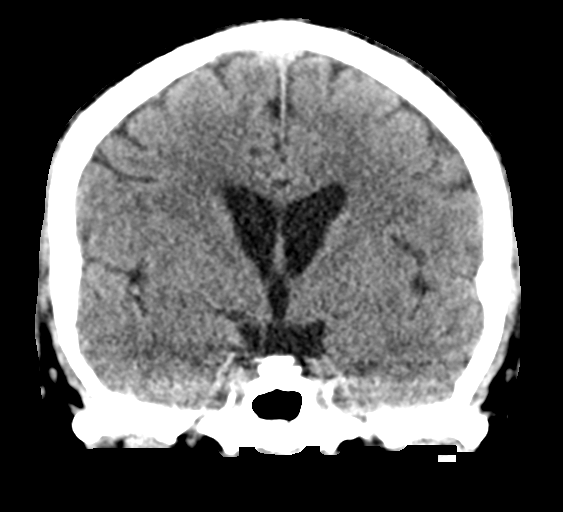
[im 34/61  brain]
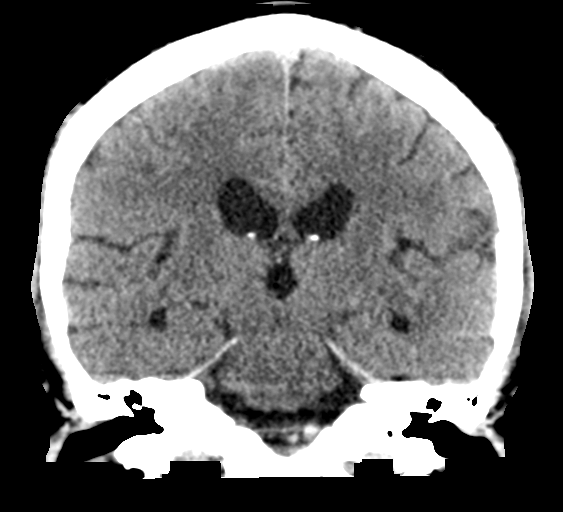

[Series 5: sagittal soft tissue · sagittal · 0.32mm/px · 3 of 53 slices shown]
[im 18/53  brain]
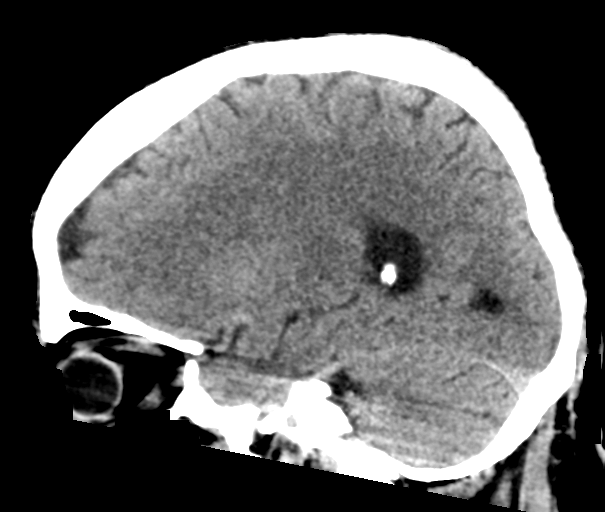
[im 27/53  brain]
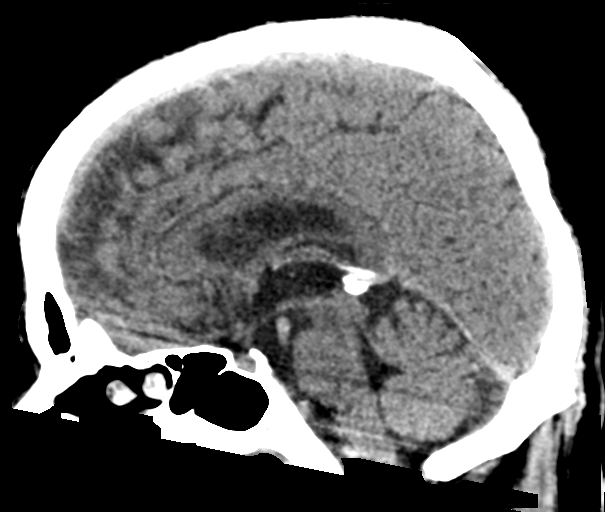
[im 35/53  brain]
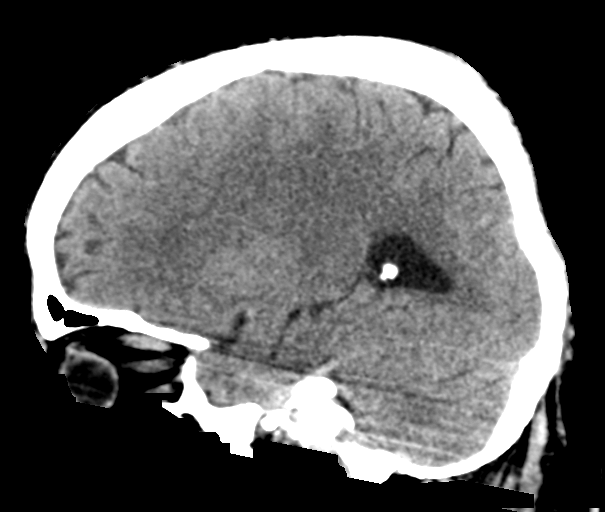

[16 of 47 positions shown; findings below may reference images not displayed]

FINDINGS: Brain: No evidence of acute infarction, hemorrhage, hydrocephalus,
extra-axial collection or mass lesion/mass effect.

Vascular: No hyperdense vessel or unexpected calcification.

Skull: Normal. Negative for fracture or focal lesion.

Sinuses/Orbits: No acute finding.
IMPRESSION: Negative head CT.

## 2023-02-09 ENCOUNTER — Other Ambulatory Visit: Payer: Self-pay

## 2023-02-09 DIAGNOSIS — R1084 Generalized abdominal pain: Secondary | ICD-10-CM

## 2023-02-09 DIAGNOSIS — D500 Iron deficiency anemia secondary to blood loss (chronic): Secondary | ICD-10-CM

## 2023-02-09 DIAGNOSIS — Z0000 Encounter for general adult medical examination without abnormal findings: Secondary | ICD-10-CM

## 2023-02-09 DIAGNOSIS — R634 Abnormal weight loss: Secondary | ICD-10-CM

## 2023-02-11 ENCOUNTER — Emergency Department: Payer: Medicare HMO

## 2023-02-11 ENCOUNTER — Inpatient Hospital Stay: Payer: Medicare HMO

## 2023-02-11 ENCOUNTER — Other Ambulatory Visit: Payer: Self-pay

## 2023-02-11 ENCOUNTER — Inpatient Hospital Stay
Admission: EM | Admit: 2023-02-11 | Discharge: 2023-02-14 | DRG: 193 | Disposition: A | Discharge status: Home / Self Care | Payer: Medicare HMO | Attending: Internal Medicine | Admitting: Internal Medicine

## 2023-02-11 DIAGNOSIS — R195 Other fecal abnormalities: Secondary | ICD-10-CM | POA: Diagnosis present

## 2023-02-11 DIAGNOSIS — I10 Essential (primary) hypertension: Secondary | ICD-10-CM | POA: Diagnosis not present

## 2023-02-11 DIAGNOSIS — D638 Anemia in other chronic diseases classified elsewhere: Secondary | ICD-10-CM | POA: Diagnosis present

## 2023-02-11 DIAGNOSIS — D631 Anemia in chronic kidney disease: Secondary | ICD-10-CM | POA: Diagnosis present

## 2023-02-11 DIAGNOSIS — I2510 Atherosclerotic heart disease of native coronary artery without angina pectoris: Secondary | ICD-10-CM | POA: Diagnosis present

## 2023-02-11 DIAGNOSIS — E1122 Type 2 diabetes mellitus with diabetic chronic kidney disease: Secondary | ICD-10-CM | POA: Diagnosis present

## 2023-02-11 DIAGNOSIS — I2489 Other forms of acute ischemic heart disease: Secondary | ICD-10-CM | POA: Diagnosis present

## 2023-02-11 DIAGNOSIS — R531 Weakness: Secondary | ICD-10-CM | POA: Diagnosis present

## 2023-02-11 DIAGNOSIS — I700 Atherosclerosis of aorta: Secondary | ICD-10-CM | POA: Diagnosis present

## 2023-02-11 DIAGNOSIS — E871 Hypo-osmolality and hyponatremia: Secondary | ICD-10-CM | POA: Diagnosis present

## 2023-02-11 DIAGNOSIS — D649 Anemia, unspecified: Secondary | ICD-10-CM

## 2023-02-11 DIAGNOSIS — Z1152 Encounter for screening for COVID-19: Secondary | ICD-10-CM

## 2023-02-11 DIAGNOSIS — D509 Iron deficiency anemia, unspecified: Secondary | ICD-10-CM | POA: Diagnosis present

## 2023-02-11 DIAGNOSIS — G4733 Obstructive sleep apnea (adult) (pediatric): Secondary | ICD-10-CM | POA: Diagnosis present

## 2023-02-11 DIAGNOSIS — K2970 Gastritis, unspecified, without bleeding: Secondary | ICD-10-CM | POA: Diagnosis present

## 2023-02-11 DIAGNOSIS — R634 Abnormal weight loss: Secondary | ICD-10-CM | POA: Diagnosis present

## 2023-02-11 DIAGNOSIS — R7989 Other specified abnormal findings of blood chemistry: Secondary | ICD-10-CM | POA: Diagnosis not present

## 2023-02-11 DIAGNOSIS — N183 Chronic kidney disease, stage 3 unspecified: Secondary | ICD-10-CM | POA: Diagnosis present

## 2023-02-11 DIAGNOSIS — J9601 Acute respiratory failure with hypoxia: Secondary | ICD-10-CM | POA: Diagnosis present

## 2023-02-11 DIAGNOSIS — I519 Heart disease, unspecified: Secondary | ICD-10-CM | POA: Diagnosis not present

## 2023-02-11 DIAGNOSIS — I129 Hypertensive chronic kidney disease with stage 1 through stage 4 chronic kidney disease, or unspecified chronic kidney disease: Secondary | ICD-10-CM | POA: Diagnosis present

## 2023-02-11 DIAGNOSIS — J189 Pneumonia, unspecified organism: Principal | ICD-10-CM

## 2023-02-11 DIAGNOSIS — Z7984 Long term (current) use of oral hypoglycemic drugs: Secondary | ICD-10-CM | POA: Diagnosis not present

## 2023-02-11 DIAGNOSIS — N179 Acute kidney failure, unspecified: Secondary | ICD-10-CM | POA: Diagnosis present

## 2023-02-11 DIAGNOSIS — E7800 Pure hypercholesterolemia, unspecified: Secondary | ICD-10-CM | POA: Diagnosis present

## 2023-02-11 DIAGNOSIS — Z01810 Encounter for preprocedural cardiovascular examination: Secondary | ICD-10-CM | POA: Diagnosis not present

## 2023-02-11 DIAGNOSIS — E119 Type 2 diabetes mellitus without complications: Secondary | ICD-10-CM | POA: Diagnosis not present

## 2023-02-11 DIAGNOSIS — Z79899 Other long term (current) drug therapy: Secondary | ICD-10-CM

## 2023-02-11 DIAGNOSIS — D7589 Other specified diseases of blood and blood-forming organs: Secondary | ICD-10-CM | POA: Diagnosis present

## 2023-02-11 DIAGNOSIS — R079 Chest pain, unspecified: Secondary | ICD-10-CM | POA: Diagnosis not present

## 2023-02-11 DIAGNOSIS — D500 Iron deficiency anemia secondary to blood loss (chronic): Secondary | ICD-10-CM | POA: Diagnosis present

## 2023-02-11 DIAGNOSIS — M1990 Unspecified osteoarthritis, unspecified site: Secondary | ICD-10-CM | POA: Diagnosis present

## 2023-02-11 DIAGNOSIS — Z7982 Long term (current) use of aspirin: Secondary | ICD-10-CM

## 2023-02-11 DIAGNOSIS — Z5309 Procedure and treatment not carried out because of other contraindication: Secondary | ICD-10-CM | POA: Diagnosis not present

## 2023-02-11 DIAGNOSIS — I5032 Chronic diastolic (congestive) heart failure: Secondary | ICD-10-CM | POA: Diagnosis not present

## 2023-02-11 LAB — COMPREHENSIVE METABOLIC PANEL
ALT: 23 U/L (ref 0–44)
AST: 22 U/L (ref 15–41)
Albumin: 2.5 g/dL — ABNORMAL LOW (ref 3.5–5.0)
Alkaline Phosphatase: 70 U/L (ref 38–126)
Anion gap: 7 (ref 5–15)
BUN: 35 mg/dL — ABNORMAL HIGH (ref 8–23)
CO2: 22 mmol/L (ref 22–32)
Calcium: 8.2 mg/dL — ABNORMAL LOW (ref 8.9–10.3)
Chloride: 104 mmol/L (ref 98–111)
Creatinine, Ser: 1.43 mg/dL — ABNORMAL HIGH (ref 0.61–1.24)
GFR, Estimated: 54 mL/min — ABNORMAL LOW (ref >60)
Glucose, Bld: 181 mg/dL — ABNORMAL HIGH (ref 70–99)
Potassium: 4.6 mmol/L (ref 3.5–5.1)
Sodium: 133 mmol/L — ABNORMAL LOW (ref 135–145)
Total Bilirubin: 0.5 mg/dL (ref 0.3–1.2)
Total Protein: 9.5 g/dL — ABNORMAL HIGH (ref 6.5–8.1)

## 2023-02-11 LAB — TYPE AND SCREEN: Unit division: 0

## 2023-02-11 LAB — CBC WITH DIFFERENTIAL/PLATELET
Abs Immature Granulocytes: 0.09 10*3/uL — ABNORMAL HIGH (ref 0.00–0.07)
Basophils Absolute: 0 10*3/uL (ref 0.0–0.1)
Basophils Relative: 0 %
Eosinophils Absolute: 0 10*3/uL (ref 0.0–0.5)
Eosinophils Relative: 1 %
HCT: 21.2 % — ABNORMAL LOW (ref 39.0–52.0)
Hemoglobin: 6.8 g/dL — ABNORMAL LOW (ref 13.0–17.0)
Immature Granulocytes: 2 %
Lymphocytes Relative: 14 %
Lymphs Abs: 0.8 10*3/uL (ref 0.7–4.0)
MCH: 34 pg (ref 26.0–34.0)
MCHC: 32.1 g/dL (ref 30.0–36.0)
MCV: 106 fL — ABNORMAL HIGH (ref 80.0–100.0)
Monocytes Absolute: 0.5 10*3/uL (ref 0.1–1.0)
Monocytes Relative: 8 %
Neutro Abs: 4.3 10*3/uL (ref 1.7–7.7)
Neutrophils Relative %: 75 %
Platelets: 165 10*3/uL (ref 150–400)
RBC: 2 MIL/uL — ABNORMAL LOW (ref 4.22–5.81)
RDW: 14.7 % (ref 11.5–15.5)
WBC: 5.8 10*3/uL (ref 4.0–10.5)
nRBC: 0 % (ref 0.0–0.2)

## 2023-02-11 LAB — IRON AND TIBC
Iron: 35 ug/dL — ABNORMAL LOW (ref 45–182)
Saturation Ratios: 16 % — ABNORMAL LOW (ref 17.9–39.5)
TIBC: 221 ug/dL — ABNORMAL LOW (ref 250–450)
UIBC: 186 ug/dL

## 2023-02-11 LAB — APTT: aPTT: 31 seconds (ref 24–36)

## 2023-02-11 LAB — LACTIC ACID, PLASMA: Lactic Acid, Venous: 1 mmol/L (ref 0.5–1.9)

## 2023-02-11 LAB — RETICULOCYTES
Immature Retic Fract: 26.3 % — ABNORMAL HIGH (ref 2.3–15.9)
RBC.: 1.83 MIL/uL — ABNORMAL LOW (ref 4.22–5.81)
Retic Count, Absolute: 73.2 10*3/uL (ref 19.0–186.0)
Retic Ct Pct: 4 % — ABNORMAL HIGH (ref 0.4–3.1)

## 2023-02-11 LAB — PROTIME-INR
INR: 1.3 — ABNORMAL HIGH (ref 0.8–1.2)
Prothrombin Time: 16.1 seconds — ABNORMAL HIGH (ref 11.4–15.2)

## 2023-02-11 LAB — RESP PANEL BY RT-PCR (RSV, FLU A&B, COVID)  RVPGX2
Influenza A by PCR: NEGATIVE
Influenza B by PCR: NEGATIVE
Resp Syncytial Virus by PCR: NEGATIVE
SARS Coronavirus 2 by RT PCR: NEGATIVE

## 2023-02-11 LAB — BRAIN NATRIURETIC PEPTIDE: B Natriuretic Peptide: 267.2 pg/mL — ABNORMAL HIGH (ref 0.0–100.0)

## 2023-02-11 LAB — ABO/RH: ABO/RH(D): B POS

## 2023-02-11 LAB — PROCALCITONIN: Procalcitonin: <0.1 ng/mL

## 2023-02-11 LAB — GLUCOSE, CAPILLARY
Glucose-Capillary: 193 mg/dL — ABNORMAL HIGH (ref 70–99)
Glucose-Capillary: 84 mg/dL (ref 70–99)

## 2023-02-11 LAB — FOLATE: Folate: 16.4 ng/mL (ref >5.9)

## 2023-02-11 LAB — CBG MONITORING, ED: Glucose-Capillary: 126 mg/dL — ABNORMAL HIGH (ref 70–99)

## 2023-02-11 LAB — TROPONIN I (HIGH SENSITIVITY)
Troponin I (High Sensitivity): 25 ng/L — ABNORMAL HIGH (ref <18)
Troponin I (High Sensitivity): 30 ng/L — ABNORMAL HIGH (ref <18)
Troponin I (High Sensitivity): 27 ng/L — ABNORMAL HIGH (ref <18)
Troponin I (High Sensitivity): 28 ng/L — ABNORMAL HIGH (ref <18)

## 2023-02-11 LAB — VITAMIN B12: Vitamin B-12: 270 pg/mL (ref 180–914)

## 2023-02-11 LAB — HIV ANTIBODY (ROUTINE TESTING W REFLEX): HIV Screen 4th Generation wRfx: NONREACTIVE

## 2023-02-11 LAB — PREPARE RBC (CROSSMATCH)

## 2023-02-11 LAB — FERRITIN: Ferritin: 107 ng/mL (ref 24–336)

## 2023-02-11 MED ORDER — ATORVASTATIN CALCIUM 10 MG PO TABS
10.0000 mg | ORAL_TABLET | Freq: Every day | ORAL | Status: DC
  Administered 2023-02-11 – 2023-02-14 (×4): 10 mg via ORAL
  Filled 2023-02-11 (×4): qty 1

## 2023-02-11 MED ORDER — SODIUM CHLORIDE 0.9 % IV SOLN
2.0000 g | INTRAVENOUS | Status: DC
  Administered 2023-02-12 – 2023-02-14 (×3): 2 g via INTRAVENOUS
  Filled 2023-02-11 (×3): qty 20

## 2023-02-11 MED ORDER — SODIUM CHLORIDE 0.9 % IV SOLN
1.0000 g | Freq: Once | INTRAVENOUS | Status: AC
  Administered 2023-02-11: 1 g via INTRAVENOUS
  Filled 2023-02-11: qty 10

## 2023-02-11 MED ORDER — OLMESARTAN MEDOXOMIL-HCTZ 20-12.5 MG PO TABS: 1.0000 | ORAL_TABLET | Freq: Every day | ORAL | Status: DC

## 2023-02-11 MED ORDER — ACETAMINOPHEN 325 MG PO TABS: 650.0000 mg | ORAL_TABLET | Freq: Four times a day (QID) | ORAL | Status: DC | PRN

## 2023-02-11 MED ORDER — IRBESARTAN 150 MG PO TABS
150.0000 mg | ORAL_TABLET | Freq: Every day | ORAL | Status: DC
  Administered 2023-02-11: 150 mg via ORAL
  Filled 2023-02-11: qty 1

## 2023-02-11 MED ORDER — SODIUM CHLORIDE 0.9 % IV SOLN
500.0000 mg | Freq: Once | INTRAVENOUS | Status: AC
  Administered 2023-02-11: 500 mg via INTRAVENOUS
  Filled 2023-02-11: qty 5

## 2023-02-11 MED ORDER — PANTOPRAZOLE 80MG IVPB - SIMPLE MED
80.0000 mg | Freq: Once | INTRAVENOUS | Status: AC
  Administered 2023-02-11: 80 mg via INTRAVENOUS
  Filled 2023-02-11: qty 100

## 2023-02-11 MED ORDER — LACTATED RINGERS IV SOLN: INTRAVENOUS | Status: DC

## 2023-02-11 MED ORDER — HYDROCHLOROTHIAZIDE 12.5 MG PO TABS
12.5000 mg | ORAL_TABLET | Freq: Every day | ORAL | Status: DC
  Administered 2023-02-11: 12.5 mg via ORAL
  Filled 2023-02-11: qty 1

## 2023-02-11 MED ORDER — INSULIN ASPART 100 UNIT/ML IJ SOLN
0.0000 [IU] | Freq: Three times a day (TID) | INTRAMUSCULAR | Status: DC
  Administered 2023-02-11: 2 [IU] via SUBCUTANEOUS
  Administered 2023-02-11: 3 [IU] via SUBCUTANEOUS
  Administered 2023-02-13: 2 [IU] via SUBCUTANEOUS
  Filled 2023-02-11 (×3): qty 1

## 2023-02-11 MED ORDER — PANTOPRAZOLE SODIUM 40 MG IV SOLR
40.0000 mg | Freq: Two times a day (BID) | INTRAVENOUS | Status: DC
  Administered 2023-02-11 – 2023-02-14 (×7): 40 mg via INTRAVENOUS
  Filled 2023-02-11 (×7): qty 10

## 2023-02-11 MED ORDER — ACETAMINOPHEN 650 MG RE SUPP: 650.0000 mg | Freq: Four times a day (QID) | RECTAL | Status: DC | PRN

## 2023-02-11 MED ORDER — SODIUM CHLORIDE 0.9 % IV SOLN
500.0000 mg | INTRAVENOUS | Status: DC
  Administered 2023-02-12 – 2023-02-14 (×3): 500 mg via INTRAVENOUS
  Filled 2023-02-11 (×3): qty 5

## 2023-02-11 MED ORDER — INSULIN ASPART 100 UNIT/ML IJ SOLN: 0.0000 [IU] | Freq: Every day | INTRAMUSCULAR | Status: DC

## 2023-02-11 MED ORDER — HYDRALAZINE HCL 25 MG PO TABS: 25.0000 mg | ORAL_TABLET | Freq: Three times a day (TID) | ORAL | Status: DC | PRN

## 2023-02-11 MED ORDER — CYANOCOBALAMIN 1000 MCG/ML IJ SOLN
1000.0000 ug | Freq: Once | INTRAMUSCULAR | Status: AC
  Administered 2023-02-11: 1000 ug via INTRAMUSCULAR
  Filled 2023-02-11: qty 1

## 2023-02-11 MED ORDER — IOHEXOL 350 MG/ML SOLN
80.0000 mL | Freq: Once | INTRAVENOUS | Status: AC | PRN
  Administered 2023-02-11: 80 mL via INTRAVENOUS

## 2023-02-11 MED ORDER — SODIUM CHLORIDE 0.9 % IV SOLN
10.0000 mL/h | Freq: Once | INTRAVENOUS | Status: AC
  Administered 2023-02-11: 10 mL/h via INTRAVENOUS

## 2023-02-11 MED ORDER — FE FUM-VIT C-VIT B12-FA 460-60-0.01-1 MG PO CAPS
1.0000 | ORAL_CAPSULE | Freq: Every day | ORAL | Status: DC
  Administered 2023-02-11 – 2023-02-14 (×4): 1 via ORAL
  Filled 2023-02-11 (×4): qty 1

## 2023-02-11 MED ORDER — POLYETHYLENE GLYCOL 3350 17 G PO PACK
17.0000 g | PACK | Freq: Every day | ORAL | Status: DC
  Administered 2023-02-11 – 2023-02-12 (×2): 17 g via ORAL
  Filled 2023-02-11 (×2): qty 1

## 2023-02-11 MED ORDER — ADULT MULTIVITAMIN W/MINERALS CH
1.0000 | ORAL_TABLET | Freq: Every day | ORAL | Status: DC
  Administered 2023-02-11 – 2023-02-14 (×4): 1 via ORAL
  Filled 2023-02-11 (×4): qty 1

## 2023-02-11 MED ORDER — POLYETHYLENE GLYCOL 3350 17 G PO PACK: 17.0000 g | PACK | Freq: Every day | ORAL | Status: DC | PRN

## 2023-02-11 NOTE — Assessment & Plan Note (Addendum)
Patient with worsening fatigue for the past few week.  Saw PCP and found to have hemoglobin of 8.2 on 5/16.  No obvious bleeding but do use ibuprofen on a daily basis.  Hemoglobin decreased to 6.8 today.  Anemia panel with some iron deficiency and anemia of chronic disease.  Recent check of B12 at PCP office on 5/16 at 236. Patient also has macrocytosis.  Folate looks normal. Also has an history of unintentional weight loss over the past few month. -1 unit of PRBC ordered. -GI was consulted-going for EGD and colonoscopy tomorrow -Avoid NSAIDs -IV protonix

## 2023-02-11 NOTE — ED Triage Notes (Addendum)
Pt presents to ED with c/o of CP/SOB for a few weeks and states "both his knees knuckles." NAD noted in triage. Pt speaking in full sentences with no issues.

## 2023-02-11 NOTE — Assessment & Plan Note (Addendum)
CT chest concerning for a large right lower lobe infiltrate.  Patient to have some cough for the past few days.  No fever or chills.  No leukocytosis.  COVID, influenza and RSV negative.  CRP and respiratory viral panel was ordered by pulmonary.  Procalcitonin negative. Pulmonary was consulted as he might need more diagnostic procedure -Started him on ceftriaxone and Zithromax -Check strep pneumo and Legionella antigen -Patient will need a repeat CT chest in 4 to 6 weeks to see the resolution of these findings.

## 2023-02-11 NOTE — Assessment & Plan Note (Signed)
Creatinine at 1.43>>1.31 with baseline appears to be around 1.2.  Mild AKI with decrease of GFR to 54.  Did receive contrast with CTA -Gentle IV fluid -Renal ultrasound-with mild bilateral renal atrophy.  No collecting system dilatation -Monitor renal function -Avoid nephrotoxins -Holding home HCTZ and irbesartan

## 2023-02-11 NOTE — Assessment & Plan Note (Signed)
Patient uses olmesartan and HCTZ at home. -Holding home antihypertensives due to AKI -As needed hydralazine

## 2023-02-11 NOTE — Assessment & Plan Note (Signed)
Patient was on metformin and Amaryl at home.  A1c of 5.7 -SSI

## 2023-02-11 NOTE — Assessment & Plan Note (Signed)
Patient was hypoxic up to 87% on room air, requiring up to 2 L of oxygen with no prior oxygen use.  No history of smoking or COPD.  Currently on room air at rest Concern of right lower lobe infiltrate and some surrounding opacities on CT chest.  CTA negative for PE, procalcitonin negative -Continue supplemental oxygen-wean as tolerated -Consulted pulmonology

## 2023-02-11 NOTE — Assessment & Plan Note (Signed)
-   Continue home statin °

## 2023-02-11 NOTE — H&P (Signed)
History and Physical    Patient: Victor Phillips:[id number] DOB: 03-12-1956 DOA: 02/11/2023 DOS: the patient was seen and examined on 02/11/2023 PCP: Danella Penton, MD  Patient coming from: Home  Chief Complaint:  Chief Complaint  Patient presents with   Chest Pain   Weakness   HPI: Victor Phillips is a 67 y.o. male with medical history significant of type 2 diabetes and hypertension came to ED with complaint of intermittent chest pain and worsening fatigue and weakness for the past few week.  Patient saw his PCP for worsening fatigue and weakness for the past few weeks, found to have hemoglobin of 8.2.  He was started on iron supplement and CT chest, abdomen and pelvis was ordered.  Labs done during that visit was mostly normal which include A1c of 5.7, normal TSH and low B12 at 286.  Due to worsening weakness and fatigue he presented to ED.  Patient was also experiencing some intermittent chest pain, there was some exertional component but he was not sure.  Having some dyspnea on exertion and excessive fatigue.  No associated shortness of breath, palpitation or diaphoresis.  No nausea, vomiting or diarrhea.  Having some cough and upper respiratory congestion for the past few days, no fever or chills.  No sick contacts.  No urinary symptoms. He also endorsed some and unintentional weight loss over the past few months.  Appetite is normal.  No obvious bleeding but uses ibuprofen on most of the days for arthritis.  Patient denies any smoking or drinking alcohol.  ED course and data reviewed.  Vital stable except mild hypoxia at 87% requiring 2 L of oxygen.  Labs pertinent for hemoglobin of 6.8, MCV 106, BUN 35, creatinine 1.43, albumin 2.5, GFR 54, BNP 267, COVID, flu and RSV negative, troponin 25>30, LA 1.0. Chest x-ray was negative for any acute abnormality. CTA chest was negative for PE but did show small right and tiny left effusions with consolidative opacity along the right  lower lobe, possible pneumonia.  Also noted to have separate areas of patchy groundglass and interstitial septal thickening which can be due to component of her edema. CT abdomen and pelvis was motion degraded but only shows large amount of colonic stool.  EKG.  Personally reviewed.  Normal sinus rhythm, right axis and T wave inversion in lead III and aVF.  No other significant abnormality.  Patient was started on Zithromax and ceftriaxone in ED.  Also ordered 1 unit of PRBC.  Review of Systems: As mentioned in the history of present illness. All other systems reviewed and are negative. Past Medical History:  Diagnosis Date   Diabetes mellitus without complication (HCC)    Hypertension    History reviewed. No pertinent surgical history.  Social History:  reports that he has never smoked. He has never used smokeless tobacco. He reports that he does not drink alcohol and does not use drugs.  No Known Allergies  History reviewed. No pertinent family history.  Prior to Admission medications   Medication Sig Start Date End Date Taking? Authorizing Provider  aspirin 325 MG tablet Take 325 mg by mouth daily.   Yes [provider]  atorvastatin (LIPITOR) 10 MG tablet Take 1 tablet by mouth daily. 11/23/18  Yes [provider]  ferrous gluconate (FERGON) 324 MG tablet Take 324 mg by mouth daily with breakfast. 02/03/23 02/03/24 Yes [provider]  glimepiride (AMARYL) 2 MG tablet Take 2 mg by mouth daily with breakfast. 02/03/23 02/03/24  Yes [provider]  ibuprofen (ADVIL) 800 MG tablet Take 800 mg by mouth. Take 1 tablet (800 mg total) by mouth daily with breakfast 11/23/18 02/03/24 Yes [provider]  metFORMIN (GLUCOPHAGE) 500 MG tablet Take 1,000 mg by mouth. Take 2 tablets (1,000 mg total) by mouth every evening 07/29/22 07/29/23 Yes [provider]  olmesartan-hydrochlorothiazide (BENICAR HCT) 20-12.5 MG tablet Take 1 tablet by mouth  daily. 02/03/23 02/03/24 Yes [provider]  pantoprazole (PROTONIX) 40 MG tablet Take 40 mg by mouth daily. 02/03/23 02/03/24 Yes [provider]    Physical Exam: Vitals:   02/11/23 1225 02/11/23 1226 02/11/23 1227 02/11/23 1231  BP:    131/79  Pulse: 80 80  83  Resp: (!) 21 20 (!) 22 20  Temp:    97.7 F (36.5 C)  TempSrc:    Oral  SpO2: 100% 99%  99%  Height:    5\' 8"  (1.727 m)    General: Vital signs reviewed.  Patient is well-developed and well-nourished, in no acute distress and cooperative with exam.  Head: Normocephalic and atraumatic. Eyes: EOMI, conjunctivae normal, no scleral icterus.  Neck: Supple, trachea midline, normal ROM, no JVD, masses, thyromegaly, or carotid bruit present.  Cardiovascular: RRR, S1 normal, S2 normal, no murmurs, gallops, or rubs. Pulmonary/Chest: Clear to auscultation bilaterally, no wheezes, rales, or rhonchi. Abdominal: Soft, non-tender, non-distended, BS positive Extremities: No lower extremity edema bilaterally,  pulses symmetric and intact bilaterally.  Neurological: A&O x3, Strength is normal and symmetric bilaterally, cranial nerve II-XII are grossly intact, no focal motor deficit, sensory intact to light touch bilaterally.  Psychiatric: Normal mood and affect.   Data Reviewed: Prior data reviewed as mentioned above in ED course.  Assessment and Plan: * Acute hypoxic respiratory failure (HCC) Patient was hypoxic up to 87% on room air, requiring up to 2 L of oxygen with no prior oxygen use.  No history of smoking or COPD Concern of right lower lobe infiltrate and some surrounding opacities on CT chest.  CTA negative for PE -Continue supplemental oxygen-wean as tolerated  CAP (community acquired pneumonia) CT chest concerning for a large right lower lobe infiltrate.  Patient to have some cough for the past few days.  No fever or chills.  No leukocytosis.  COVID, influenza and RSV negative. -Started him on ceftriaxone and  Zithromax -Check strep pneumo and Legionella antigen -Check procalcitonin -Patient will need a repeat CT chest in 4 to 6 weeks to see the resolution of these findings.  Symptomatic anemia Patient with worsening fatigue for the past few week.  Saw PCP and found to have hemoglobin of 8.2 on 5/16.  No obvious bleeding but do use ibuprofen on a daily basis.  Hemoglobin decreased to 6.8 today.  Anemia panel with some iron deficiency and anemia of chronic disease.  Recent check of B12 at PCP office on 5/16 at 236. Patient also has macrocytosis.  Folate looks normal. Also has an history of unintentional weight loss over the past few month. -1 unit of PRBC ordered. -GI was consulted-as patient need EGD and colonoscopy which can be either done during current hospitalization or soon after discharge. -Avoid NSAIDs -IV protonix  Elevated troponin Intermittent chest pain. Patient has some intermittent atypical chest pain, there is some exertional component, nonradiating and no associated factors. Troponin barely positive with a flat curve. CT chest with concerning of bilateral small pleural effusions and groundglass opacities which can be due to pulmonary edema. BNP at 267.  No peripheral edema.  No prior cardiac history -Echocardiogram -Monitor volume status  AKI (acute kidney injury) (HCC) Creatinine at 1.43 with baseline appears to be around 1.2.  Mild AKI with decrease of GFR to 54.  Did receive contrast with CTA -Gentle IV fluid -Renal ultrasound -Monitor renal function -Avoid nephrotoxins -Holding home HCTZ and irbesartan  Essential hypertension Patient uses olmesartan and HCTZ at home. -Holding home antihypertensives due to AKI -As needed hydralazine  Hypercholesteremia -Continue home statin  Type 2 diabetes mellitus (HCC) Patient was on metformin and Amaryl at home.  A1c of 5.7 -SSI    Advance Care Planning:   Code Status: Full Code discussed with patient and wife at  bedside  Consults: Gastro nephrology  Family Communication: Discussed with wife at bedside  Severity of Illness: The appropriate patient status for this patient is INPATIENT. Inpatient status is judged to be reasonable and necessary in order to provide the required intensity of service to ensure the patient's safety. The patient's presenting symptoms, physical exam findings, and initial radiographic and laboratory data in the context of their chronic comorbidities is felt to place them at high risk for further clinical deterioration. Furthermore, it is not anticipated that the patient will be medically stable for discharge from the hospital within 2 midnights of admission.   * I certify that at the point of admission it is my clinical judgment that the patient will require inpatient hospital care spanning beyond 2 midnights from the point of admission due to high intensity of service, high risk for further deterioration and high frequency of surveillance required.*  This record has been created using Conservation officer, historic buildings. Errors have been sought and corrected,but may not always be located. Such creation errors do not reflect on the standard of care.   Author: Arnetha Courser, MD 02/11/2023 1:21 PM  For on call review [email or web address].

## 2023-02-11 NOTE — ED Notes (Signed)
Patient transported to CT 

## 2023-02-11 NOTE — Consult Note (Signed)
GI Inpatient Consult Note  Reason for Consult: Symptomatic anemia    Attending Requesting Consult: Dr. Arnetha Courser, MD  History of Present Illness: Victor Phillips is a 67 y.o. male seen for evaluation of symptomatic anemia at the request of admitting hospitalist - Dr. Arnetha Courser. Patient has a PMH of HTN, HLD, OSA, OA, and T2DM presented to the [name] ED 5/31 this AM for chief complaint of generalized weakness, fatigue, and chest pain. Upon presentation to the ED, he was hypertensive (156/76), HR 94, RR 17, and O2 sats 97% on room air. Labs were significant for hemoglobin 6.8, MCV 106, sodium 133, potassium 4.6, BUN 35, serum creatinine 1.43, albumin 2.5, hs-troponin 25--30, INR 1.3, and BNP 267.2. Per ED physician, DRE showed no gross blood or melena. He was given dose of IV Protonix 80 mg. CTA chest showed pneumonia in RLL. CT abd/pelvis with contrast commented on large amount of colonic stool with no obstruction or other acute findings. He was started on broad-spectrum antibiotics. Blood cultures obtained and in process. He was hypoxic in room to 87% in ED and placed on 2L O2 via [place]. He was admitted to hospital for acute hypoxic respiratory failure 2/2 pneumonia and severe symptomatic anemia. GI consulted for further evaluation and management of anemia.   Patient seen and examined this afternoon resting comfortably in ED stretcher. No family in room. He is actively getting 1 unit pRBCs at time of our encounter. He reports he is feeling a little better since this morning. He was trying to get out of bed this morning and felt very weak and fell on the floor just beside his bed because his left leg gave out. There was no loss of consciousness. He was constipated yesterday and took some OTC laxative. He denies any gross hematochezia or melena. He denies any abdominal pain. He denies any nausea, vomiting, esophageal dysphagia, odynophagia, early satiety, hoarseness, or epigastric abdominal pain. He  has never had an endoscopy or colonoscopy. He was seen last week by his PCP for routine exam where blood work showed new onset anemia with hemoglobin 8.2 and MCV 102 compared to hemoglobin 13.0 six months ago. He was started on oral supplement and referred to GI to discuss luminal evaluation, but wasn't able to get an appointment until later in the year. He reports he has lost 5-lbs over the last 26-months and this has been unintentional. Appetite has been at baseline. He just doesn't get as hungry as he is used to. He does take ibuprofen 800 mg daily for OA/minor aches and pains 4-5 days out of the week. He denies any tobacco or EtOH use.   Past Medical History:  Past Medical History:  Diagnosis Date   Diabetes mellitus without complication (HCC)    Hypertension     Problem List: Patient Active Problem List   Diagnosis Date Noted   Acute hypoxic respiratory failure (HCC) 02/11/2023   CAP (community acquired pneumonia) 02/11/2023   Type 2 diabetes mellitus (HCC) 02/11/2023   Essential hypertension 02/11/2023   Anemia 02/11/2023   Hypercholesteremia 02/11/2023   Elevated troponin 02/11/2023   AKI (acute kidney injury) (HCC) 02/11/2023   Weakness 02/11/2023    Past Surgical History: History reviewed. No pertinent surgical history.  Allergies: No Known Allergies  Home Medications: (Not in a hospital admission)  Home medication reconciliation was completed with the patient.   Scheduled Inpatient Medications:    atorvastatin  10 mg Oral Daily   cyanocobalamin  1,000 mcg  Intramuscular Once   Fe Fum-Vit C-Vit B12-FA  1 capsule Oral QPC breakfast   insulin aspart  0-15 Units Subcutaneous TID WC   insulin aspart  0-5 Units Subcutaneous QHS   multivitamin with minerals  1 tablet Oral Daily   pantoprazole (PROTONIX) IV  40 mg Intravenous Q12H   polyethylene glycol  17 g Oral Daily    Continuous Inpatient Infusions:    [START ON 02/12/2023] azithromycin     [START ON 02/12/2023]  cefTRIAXone (ROCEPHIN)  IV     lactated ringers      PRN Inpatient Medications:  acetaminophen **OR** acetaminophen, hydrALAZINE  Family History: family history is not on file.  The patient's family history is negative for inflammatory bowel disorders, GI malignancy, or solid organ transplantation.  Social History:   reports that he has never smoked. He has never used smokeless tobacco. He reports that he does not drink alcohol and does not use drugs. The patient denies ETOH, tobacco, or drug use.   Review of Systems: Constitutional: + weight loss Eyes: No changes in vision. ENT: No oral lesions, sore throat.  GI: see HPI.  Heme/Lymph: No easy bruising.  CV: No chest pain.  GU: No hematuria.  Integumentary: No rashes.  Neuro: No headaches.  Psych: No depression/anxiety.  Endocrine: No heat/cold intolerance.  Allergic/Immunologic: No urticaria.  Resp: No cough, SOB.  Musculoskeletal: No joint swelling.    Physical Examination: BP 131/79   Pulse 83   Temp 97.7 F (36.5 C) (Oral)   Resp 20   Ht 5\' 8"  (1.727 m)   SpO2 99%   BMI 27.98 kg/m  Gen: NAD, alert and oriented x 4 HEENT: PEERLA, EOMI, Neck: supple, no JVD or thyromegaly Chest: CTA bilaterally, no wheezes, crackles, or other adventitious sounds CV: RRR, no m/g/c/r Abd: soft, ND, +BS in all four quadrants; nontender to palpation in all four quadrants, no HSM, guarding, ridigity, or rebound tenderness, abdominal asymmetry present  Ext: no edema, well perfused with 2+ pulses, Skin: no rash or lesions noted Lymph: no LAD  Data: Lab Results  Component Value Date   WBC 5.8 02/11/2023   HGB 6.8 (L) 02/11/2023   HCT 21.2 (L) 02/11/2023   MCV 106.0 (H) 02/11/2023   PLT 165 02/11/2023   Recent Labs  Lab 02/11/23 0905  HGB 6.8*   Lab Results  Component Value Date   NA 133 (L) 02/11/2023   K 4.6 02/11/2023   CL 104 02/11/2023   CO2 22 02/11/2023   BUN 35 (H) 02/11/2023   CREATININE 1.43 (H) 02/11/2023    Lab Results  Component Value Date   ALT 23 02/11/2023   AST 22 02/11/2023   ALKPHOS 70 02/11/2023   BILITOT 0.5 02/11/2023   Recent Labs  Lab 02/11/23 0906  APTT 31  INR 1.3*   CTA chest 02/11/2023: IMPRESSION: Evaluation limited by motion particularly of the abdomen and pelvis.   No segmental or larger pulmonary embolism.   Small right and tiny left effusion with consolidative opacity along the right lower lobe. Possible pneumonia. Recommend follow-up.   Separate areas of patchy ground-glass interstitial septal thickening. Please correlate for any other etiology including a component of edema.   Large amount of colonic stool. No bowel obstruction, free air or free fluid.  Assessment/Plan:  67 y/o Caucasian male with a PMH of HTN, HLD, OSA, OA, and T2DM presented to the [facility] ED 5/31 this AM for chief complaint of generalized weakness, fatigue, and chest pain c/w symptomatic  anemia and found to have RLL pneumonia.   Symptomatic anemia/IDA - hemoglobin 6.8 with macrocytic indices (MCV 106) with no signs of overt gastrointestinal bleeding. Baseline hemoglobin >13 six months ago. Hemoglobin 8.2 last week. Differential diagnosis includes chronic GI blood loss from PUD, gastritis, occult GI neoplasm, polyp, erosive esophagitis, AVMs, diverticular, colitis, Celiac disease, or non-GI causes such as nutritional deficiencies, malabsorption, or hemolysis.   Acute hypoxic respiratory failure 2/2 RLL pneumonia  Chest pain/Elevated troponin   AKI  Overuse of NSAIDs  T2DM  Recommendations:  - Transfuse 1 unit pRBCs as ordered - Continue to monitor serial H&H. Transfuse for hemoglobin <7.0.  - Continue Protonix IV BID for gastric protection  - No signs of overt gastrointestinal bleeding - Continue to monitor for signs of bleeding - Avoid NSAIDs - Continue supportive care per primary team and treatment of RLL PNA - He will benefit from EGD and colonoscopy after optimization of  respiratory status and cardiac work-up for elevated hs-troponin. Echo ordered.  - EGD and colonoscopy when clinically feasible  - GI following along with you - Heart healthy diet OK for now. Start clears tomorrow.   I reviewed the risks (including bleeding, perforation, infection, anesthesia complications, cardiac/respiratory complications), benefits and alternatives of EGD and colonoscopy. Patient consents to proceed when clinically feasible.    Thank you for the consult. Please call with questions or concerns.  Gilda Crease, PA-C [facility] [phone]

## 2023-02-11 NOTE — Assessment & Plan Note (Addendum)
Intermittent chest pain. Patient has some intermittent atypical chest pain, there is some exertional component, nonradiating and no associated factors. Troponin barely positive with a flat curve. CT chest with concerning of bilateral small pleural effusions and groundglass opacities which can be due to pulmonary edema. BNP at 267.  No peripheral edema.  No prior cardiac history -Echocardiogram -Monitor volume status

## 2023-02-11 NOTE — ED Provider Notes (Signed)
[facility] Provider Note    Event Date/Time   First MD Initiated Contact with Patient 02/11/23 [phone]     (approximate)   History   Chest Pain and Weakness   HPI  Victor Phillips is a 67 y.o. male past medical history significant for hypertension, hyperlipidemia, diabetes, presents to the emergency department with chest pain, left leg pain and generalized weakness and fatigue.  States that over the past couple of weeks he has been experiencing generalized weakness, some mild weight loss and abdominal pain.  Recently evaluated this past Thursday by primary care physician office and was told that he was anemic.  Complaints of left knee pain at that time and was told it was likely osteoarthritis.  Did note a hemoglobin drop and had a scheduled CT scan as an outpatient but states that no one called to schedule him.  Denies any blood in his stool or melena.  Denies any nausea, vomiting or diarrhea.  Not attempting to lose weight with no change in diet.  Does endorse early satiety.  No fever or chills.  States that he was told that he had a blood clot in his neck and that is why he was started on aspirin in the past.  Does not take any other anticoagulation.  Started on Protonix with primary care provider.  No history of endoscopy or colonoscopy.     Physical Exam   Triage Vital Signs: ED Triage Vitals [02/11/23 0840]  Enc Vitals Group     BP (!) 156/76     Pulse Rate 94     Resp 17     Temp 98.4 F (36.9 C)     Temp Source Oral     SpO2 97 %     Weight      Height      Head Circumference      Peak Flow      Pain Score 4     Pain Loc      Pain Edu?      Excl. in GC?     Most recent vital signs: Vitals:   02/11/23 0840  BP: (!) 156/76  Pulse: 94  Resp: 17  Temp: 98.4 F (36.9 C)  SpO2: 97%    Physical Exam Constitutional:      Appearance: He is well-developed.  HENT:     Head: Atraumatic.  Eyes:     Conjunctiva/sclera: Conjunctivae normal.   Cardiovascular:     Rate and Rhythm: Regular rhythm.     Heart sounds: Murmur heard.  Pulmonary:     Effort: No respiratory distress.  Abdominal:     Tenderness: There is abdominal tenderness (Epigastric and left upper abdominal tenderness to palpation with no rebound or guarding.).  Genitourinary:    Comments: Rectal exam with no gross blood or melena Musculoskeletal:     Cervical back: Normal range of motion.     Right lower leg: No edema.     Left lower leg: No edema.     Comments: Full range of motion to bilateral lower extremities.  Skin:    General: Skin is warm.     Capillary Refill: Capillary refill takes less than 2 seconds.  Neurological:     General: No focal deficit present.     Mental Status: He is alert. Mental status is at baseline.     IMPRESSION / MDM / ASSESSMENT AND PLAN / ED COURSE  I reviewed the triage vital signs and the nursing  notes.  Differential diagnosis including anemia, ACS, dysrhythmia, malignancy, gastritis/GERD, pulmonary embolism.  On chart review patient was recently evaluated her primary care provider office and found to be anemic with a hemoglobin that had dropped to 8.  Put in for an emergent referral to gastroenterology for endoscopy and colonoscopy.  Ordered a CT scan abdomen and pelvis.   EKG  I, Corena Herter, the attending physician, personally viewed and interpreted this ECG.   Rate: Normal  Rhythm: Normal sinus  Axis: Normal  Intervals: Normal  ST&T Change: Isolated T wave that is inverted to lead III.  Mild ST depression to the lateral leads with no ST elevation.  No tachycardic or bradycardic dysrhythmias while on cardiac telemetry.  RADIOLOGY I independently reviewed imaging, my interpretation of imaging: Chest x-ray with no focal findings consistent with pneumonia.  Read as no acute findings.  CTA concerning for pneumonia to the right lower lung.  Significant movement on CT abdomen and pelvis but no obvious signs of  malignancy.   LABS (all labs ordered are listed, but only abnormal results are displayed) Labs interpreted as -    Labs Reviewed  CBC WITH DIFFERENTIAL/PLATELET - Abnormal; Notable for the following components:      Result Value   RBC 2.00 (*)    Hemoglobin 6.8 (*)    HCT 21.2 (*)    MCV 106.0 (*)    Abs Immature Granulocytes 0.09 (*)    All other components within normal limits  COMPREHENSIVE METABOLIC PANEL - Abnormal; Notable for the following components:   Sodium 133 (*)    Glucose, Bld 181 (*)    BUN 35 (*)    Creatinine, Ser 1.43 (*)    Calcium 8.2 (*)    Total Protein 9.5 (*)    Albumin 2.5 (*)    GFR, Estimated 54 (*)    All other components within normal limits  TROPONIN I (HIGH SENSITIVITY) - Abnormal; Notable for the following components:   Troponin I (High Sensitivity) 25 (*)    All other components within normal limits  CULTURE, BLOOD (ROUTINE X 2)  CULTURE, BLOOD (ROUTINE X 2)  BRAIN NATRIURETIC PEPTIDE  PROTIME-INR  APTT  LACTIC ACID, PLASMA  LACTIC ACID, PLASMA  TYPE AND SCREEN  PREPARE RBC (CROSSMATCH)  ABO/RH  TROPONIN I (HIGH SENSITIVITY)     MDM  Lab work was significant drop of hemoglobin to 6.8.  Appears macrocytic.  Denies any alcohol use.  Rectal exam with no gross blood or melena.  Given his epigastric pain did give a dose of IV Protonix 80 mg.  Creatinine mildly elevated 1.4, BUN 35.  Mild hyponatremia.    CT scan concerning for possible pneumonia.  Patient does endorse a mild cough of the past couple of days.  Does state that he feels wheezy especially when laying down.  Added on a BNP.  Blood cultures and a lactic acid obtained.  Will start on antibiotics to cover for community-acquired pneumonia.  Added on coag studies.  Does endorse recent nosebleed.  On my evaluation in the room patient hypoxic to 87% with a good waveform, placed on 2 L nasal cannula.  Consulted hospitalist for admission for acute hypoxic respiratory failure,  pneumonia, anemia requiring blood transfusion   PROCEDURES:  Critical Care performed: yes  .Critical Care  Performed by: Corena Herter, MD Authorized by: Corena Herter, MD   Critical care provider statement:    Critical care time (minutes):  30   Critical care time was exclusive  of:  Separately billable procedures and treating other patients   Critical care was time spent personally by me on the following activities:  Development of treatment plan with patient or surrogate, discussions with consultants, evaluation of patient's response to treatment, examination of patient, ordering and review of laboratory studies, ordering and review of radiographic studies, ordering and performing treatments and interventions, pulse oximetry, re-evaluation of patient's condition and review of old charts   Patient's presentation is most consistent with acute presentation with potential threat to life or bodily function.   MEDICATIONS ORDERED IN ED: Medications  cefTRIAXone (ROCEPHIN) 1 g in sodium chloride 0.9 % 100 mL IVPB (has no administration in time range)  azithromycin (ZITHROMAX) 500 mg in sodium chloride 0.9 % 250 mL IVPB (has no administration in time range)  pantoprazole (PROTONIX) 80 mg /NS 100 mL IVPB (0 mg Intravenous Stopped 02/11/23 1049)  0.9 %  sodium chloride infusion (10 mL/hr Intravenous Other (enter comment in med admin window) 02/11/23 1004)  iohexol (OMNIPAQUE) 350 MG/ML injection 80 mL (80 mLs Intravenous Contrast Given 02/11/23 1006)    FINAL CLINICAL IMPRESSION(S) / ED DIAGNOSES   Final diagnoses:  Anemia, unspecified type  Weakness  Community acquired pneumonia of right lower lobe of lung     Rx / DC Orders   ED Discharge Orders     None        Note:  This document was prepared using Dragon voice recognition software and may include unintentional dictation errors.   Corena Herter, MD 02/11/23 1055

## 2023-02-12 DIAGNOSIS — J9601 Acute respiratory failure with hypoxia: Secondary | ICD-10-CM | POA: Diagnosis not present

## 2023-02-12 DIAGNOSIS — E119 Type 2 diabetes mellitus without complications: Secondary | ICD-10-CM

## 2023-02-12 DIAGNOSIS — D649 Anemia, unspecified: Secondary | ICD-10-CM | POA: Diagnosis not present

## 2023-02-12 DIAGNOSIS — J189 Pneumonia, unspecified organism: Secondary | ICD-10-CM | POA: Diagnosis not present

## 2023-02-12 LAB — URINALYSIS, COMPLETE (UACMP) WITH MICROSCOPIC
Bilirubin Urine: NEGATIVE
Glucose, UA: NEGATIVE mg/dL
Ketones, ur: NEGATIVE mg/dL
Leukocytes,Ua: NEGATIVE
Nitrite: NEGATIVE
Protein, ur: 100 mg/dL — AB
Specific Gravity, Urine: 1.023 (ref 1.005–1.030)
Squamous Epithelial / HPF: NONE SEEN /HPF (ref 0–5)
pH: 5 (ref 5.0–8.0)

## 2023-02-12 LAB — STREP PNEUMONIAE URINARY ANTIGEN: Strep Pneumo Urinary Antigen: NEGATIVE

## 2023-02-12 LAB — BASIC METABOLIC PANEL
Anion gap: 6 (ref 5–15)
BUN: 34 mg/dL — ABNORMAL HIGH (ref 8–23)
CO2: 25 mmol/L (ref 22–32)
Calcium: 8.6 mg/dL — ABNORMAL LOW (ref 8.9–10.3)
Chloride: 104 mmol/L (ref 98–111)
Creatinine, Ser: 1.31 mg/dL — ABNORMAL HIGH (ref 0.61–1.24)
GFR, Estimated: >60 mL/min (ref >60)
Glucose, Bld: 71 mg/dL (ref 70–99)
Potassium: 4.4 mmol/L (ref 3.5–5.1)
Sodium: 135 mmol/L (ref 135–145)

## 2023-02-12 LAB — TYPE AND SCREEN
ABO/RH(D): B POS
Antibody Screen: NEGATIVE

## 2023-02-12 LAB — BPAM RBC
Blood Product Expiration Date: 202406222359
ISSUE DATE / TIME: 202405311201
Unit Type and Rh: 7300

## 2023-02-12 LAB — GLUCOSE, CAPILLARY
Glucose-Capillary: 99 mg/dL (ref 70–99)
Glucose-Capillary: 109 mg/dL — ABNORMAL HIGH (ref 70–99)
Glucose-Capillary: 135 mg/dL — ABNORMAL HIGH (ref 70–99)
Glucose-Capillary: 162 mg/dL — ABNORMAL HIGH (ref 70–99)

## 2023-02-12 LAB — CBC
HCT: 23.2 % — ABNORMAL LOW (ref 39.0–52.0)
Hemoglobin: 7.6 g/dL — ABNORMAL LOW (ref 13.0–17.0)
MCH: 32.8 pg (ref 26.0–34.0)
MCHC: 32.8 g/dL (ref 30.0–36.0)
MCV: 100 fL (ref 80.0–100.0)
Platelets: 157 10*3/uL (ref 150–400)
RBC: 2.32 MIL/uL — ABNORMAL LOW (ref 4.22–5.81)
RDW: 17 % — ABNORMAL HIGH (ref 11.5–15.5)
WBC: 6 10*3/uL (ref 4.0–10.5)
nRBC: 0 % (ref 0.0–0.2)

## 2023-02-12 LAB — HEMOGLOBIN A1C
Hgb A1c MFr Bld: 5.9 % — ABNORMAL HIGH (ref 4.8–5.6)
Mean Plasma Glucose: 123 mg/dL

## 2023-02-12 MED ORDER — SODIUM CHLORIDE 0.9 % IV SOLN: INTRAVENOUS | Status: DC

## 2023-02-12 MED ORDER — CYANOCOBALAMIN 1000 MCG/ML IJ SOLN
1000.0000 ug | Freq: Once | INTRAMUSCULAR | Status: AC
  Administered 2023-02-12: 1000 ug via INTRAMUSCULAR
  Filled 2023-02-12: qty 1

## 2023-02-12 MED ORDER — POLYETHYLENE GLYCOL 3350 17 GM/SCOOP PO POWD
1.0000 | Freq: Once | ORAL | Status: AC
  Administered 2023-02-12: 255 g via ORAL
  Filled 2023-02-12: qty 255

## 2023-02-12 MED ORDER — FUROSEMIDE 10 MG/ML IJ SOLN
60.0000 mg | Freq: Every day | INTRAMUSCULAR | Status: DC
  Administered 2023-02-12 – 2023-02-14 (×3): 60 mg via INTRAVENOUS
  Filled 2023-02-12 (×3): qty 8

## 2023-02-12 MED ORDER — BISACODYL 5 MG PO TBEC
10.0000 mg | DELAYED_RELEASE_TABLET | Freq: Once | ORAL | Status: AC
  Administered 2023-02-12: 10 mg via ORAL
  Filled 2023-02-12: qty 2

## 2023-02-12 NOTE — Progress Notes (Signed)
[facility] Inpatient Progress Note    Subjective: Patient seen for f/u symptomatic anemia.  Patient says he feels overall slightly better with rest.  Hemoglobin 7.6, up from 6.8 yesterday.  Objective: Vital signs in last 24 hours: Temp:  [97.8 F (36.6 C)-98.5 F (36.9 C)] 98.5 F (36.9 C) (06/01 0945) Pulse Rate:  [81-86] 82 (06/01 0945) Resp:  [18-19] 18 (06/01 0945) BP: (139-151)/(74-85) 151/85 (06/01 0945) SpO2:  [92 %-99 %] 92 % (06/01 0945) Weight:  [77.6 kg] 77.6 kg (06/01 1418) Blood pressure (!) 151/85, pulse 82, temperature 98.5 F (36.9 C), resp. rate 18, height 5\' 8"  (1.727 m), weight 77.6 kg, SpO2 92 %.    Intake/Output from previous day: 05/31 0701 - 06/01 0700 In: 1225 [I.V.:1125; IV Piggyback:100] Out: -   Intake/Output this shift: No intake/output data recorded.   Gen: NAD. Appears comfortable.  HEENT: [place]/AT. PERRLA. Normal external ear exam.  Chest: CTA, no wheezes.  CV: RR nl S1, S2. No gallops.  Abd: soft, nt, nd. BS+  Ext: no edema. Pulses 2+  Neuro: Alert and oriented. Judgement appears normal. Nonfocal.   Lab Results: Results for orders placed or performed during the hospital encounter of 02/11/23 (from the past 24 hour(s))  Glucose, capillary     Status: Abnormal   Collection Time: 02/11/23  5:11 PM  Result Value Ref Range   Glucose-Capillary 193 (H) 70 - 99 mg/dL  Troponin I (High Sensitivity)     Status: Abnormal   Collection Time: 02/11/23  5:42 PM  Result Value Ref Range   Troponin I (High Sensitivity) 27 (H) <18 ng/L  Troponin I (High Sensitivity)     Status: Abnormal   Collection Time: 02/11/23  7:37 PM  Result Value Ref Range   Troponin I (High Sensitivity) 28 (H) <18 ng/L  Glucose, capillary     Status: None   Collection Time: 02/11/23  9:14 PM  Result Value Ref Range   Glucose-Capillary 84 70 - 99 mg/dL  CBC     Status: Abnormal   Collection Time: 02/12/23  3:26 AM  Result Value Ref Range   WBC  6.0 4.0 - 10.5 K/uL   RBC 2.32 (L) 4.22 - 5.81 MIL/uL   Hemoglobin 7.6 (L) 13.0 - 17.0 g/dL   HCT 40.9 (L) 81.1 - 91.4 %   MCV 100.0 80.0 - 100.0 fL   MCH 32.8 26.0 - 34.0 pg   MCHC 32.8 30.0 - 36.0 g/dL   RDW 78.2 (H) 95.6 - 21.3 %   Platelets 157 150 - 400 K/uL   nRBC 0.0 0.0 - 0.2 %  Basic metabolic panel     Status: Abnormal   Collection Time: 02/12/23  3:26 AM  Result Value Ref Range   Sodium 135 135 - 145 mmol/L   Potassium 4.4 3.5 - 5.1 mmol/L   Chloride 104 98 - 111 mmol/L   CO2 25 22 - 32 mmol/L   Glucose, Bld 71 70 - 99 mg/dL   BUN 34 (H) 8 - 23 mg/dL   Creatinine, Ser 0.86 (H) 0.61 - 1.24 mg/dL   Calcium 8.6 (L) 8.9 - 10.3 mg/dL   GFR, Estimated >57 >84 mL/min   Anion gap 6 5 - 15  Glucose, capillary     Status: None   Collection Time: 02/12/23 10:25 AM  Result Value Ref Range   Glucose-Capillary 99 70 - 99 mg/dL  Glucose, capillary     Status: Abnormal   Collection Time: 02/12/23  1:33  PM  Result Value Ref Range   Glucose-Capillary 109 (H) 70 - 99 mg/dL     Recent Labs    40/98/11 0905 02/12/23 0326  WBC 5.8 6.0  HGB 6.8* 7.6*  HCT 21.2* 23.2*  PLT 165 157   BMET Recent Labs    02/11/23 0905 02/12/23 0326  NA 133* 135  K 4.6 4.4  CL 104 104  CO2 22 25  GLUCOSE 181* 71  BUN 35* 34*  CREATININE 1.43* 1.31*  CALCIUM 8.2* 8.6*   LFT Recent Labs    02/11/23 0905  PROT 9.5*  ALBUMIN 2.5*  AST 22  ALT 23  ALKPHOS 70  BILITOT 0.5   PT/INR Recent Labs    02/11/23 0906  LABPROT 16.1*  INR 1.3*   Hepatitis Panel No results for input(s): "HEPBSAG", "HCVAB", "HEPAIGM", "HEPBIGM" in the last 72 hours. C-Diff No results for input(s): "CDIFFTOX" in the last 72 hours. No results for input(s): "CDIFFPCR" in the last 72 hours.   Studies/Results: US RENAL  Result Date: 02/11/2023 CLINICAL DATA:  Acute kidney insufficiency EXAM: RENAL / URINARY TRACT ULTRASOUND COMPLETE COMPARISON:  CT earlier 02/11/2019 FINDINGS: Right Kidney: Renal  measurements: 11.7 x 6.8 x 6.3 cm = volume: 262.4 mL. Mild parenchymal atrophy prominent renal sinus fat. No perinephric fluid. Left Kidney: Renal measurements: 12.0 x 7.1 x 5.6 cm = volume: 250 mL. Mild parenchymal atrophy with prominent renal sinus fat. No collecting system dilatation or perinephric fluid. Bladder: Distended with fluid.  Preserved contour.  Ureteral jets are seen. Other: None. IMPRESSION: Mild bilateral renal atrophy. No collecting system dilatation. Please correlate with separate contrast CT from same day Electronically Signed   By: Karen Kays M.D.   On: 02/11/2023 15:20   CT ABDOMEN PELVIS W CONTRAST  Result Date: 02/11/2023 CLINICAL DATA:  Chest pain and shortness of breath for a few weeks. Nonlocalized pain, anemia and weight loss. EXAM: CT ANGIOGRAPHY CHEST CT ABDOMEN AND PELVIS WITH CONTRAST TECHNIQUE: Multidetector CT imaging of the chest was performed using the standard protocol during bolus administration of intravenous contrast. Multiplanar CT image reconstructions and MIPs were obtained to evaluate the vascular anatomy. Multidetector CT imaging of the abdomen and pelvis was performed using the standard protocol during bolus administration of intravenous contrast. RADIATION DOSE REDUCTION: This exam was performed according to the departmental dose-optimization program which includes automated exposure control, adjustment of the mA and/or kV according to patient size and/or use of iterative reconstruction technique. CONTRAST:  80mL OMNIPAQUE IOHEXOL 350 MG/ML SOLN COMPARISON:  Chest x-ray earlier 02/11/2023 and older FINDINGS: CTA CHEST FINDINGS Cardiovascular: No segmental or larger pulmonary embolism identified. Heart is mildly enlarged. Small pericardial effusion. The thoracic aorta has a normal course and caliber with mild atherosclerotic calcified plaque. Mediastinum/Nodes: No specific abnormal lymph node enlargement identified in the axillary regions. There are several  prominent small mediastinal and hilar lymph nodes. More numerous than usually seen but predominantly less than 1 cm in size in short axis and not pathologic by size criteria. Small hiatal hernia. Preserved thyroid gland. Lungs/Pleura: Small right and tiny left pleural effusion. Diffuse patchy ground-glass opacity seen in the lungs with some areas of interstitial thickening. Please correlate for a component of edema. There is a consolidative opacity as well in the right lower lobe. Possible separate infiltrate or pneumonia. Associated areas of bronchial wall thickening bilaterally. Greatest in the right lower lobe. Musculoskeletal: Mild degenerative changes along the spine. Review of the MIP images confirms the above findings. CT  ABDOMEN and PELVIS FINDINGS Hepatobiliary: No focal liver abnormality is seen. No gallstones, gallbladder wall thickening, or biliary dilatation. Pancreas: Unremarkable. No pancreatic ductal dilatation or surrounding inflammatory changes. Spleen: Normal in size without focal abnormality. Adrenals/Urinary Tract: Adrenal glands are unremarkable. Kidneys are normal, without renal calculi, focal lesion, or hydronephrosis. Bladder is unremarkable. Stomach/Bowel: Large amount of colonic stool. The bowel is nondilated. No oral contrast. Vascular/Lymphatic: Aortic atherosclerosis. No enlarged abdominal or pelvic lymph nodes. Reproductive: Prostate is unremarkable. Other: Evaluation limited by motion throughout the examination. No free air or free fluid. Musculoskeletal: Scattered degenerative changes of the spine and pelvis. Review of the MIP images confirms the above findings. IMPRESSION: Evaluation limited by motion particularly of the abdomen and pelvis. No segmental or larger pulmonary embolism. Small right and tiny left effusion with consolidative opacity along the right lower lobe. Possible pneumonia. Recommend follow-up. Separate areas of patchy ground-glass interstitial septal thickening.  Please correlate for any other etiology including a component of edema. Large amount of colonic stool. No bowel obstruction, free air or free fluid. Electronically Signed   By: Karen Kays M.D.   On: 02/11/2023 10:39   CT Angio Chest PE W/Cm &/Or Wo Cm  Result Date: 02/11/2023 CLINICAL DATA:  Chest pain and shortness of breath for a few weeks. Nonlocalized pain, anemia and weight loss. EXAM: CT ANGIOGRAPHY CHEST CT ABDOMEN AND PELVIS WITH CONTRAST TECHNIQUE: Multidetector CT imaging of the chest was performed using the standard protocol during bolus administration of intravenous contrast. Multiplanar CT image reconstructions and MIPs were obtained to evaluate the vascular anatomy. Multidetector CT imaging of the abdomen and pelvis was performed using the standard protocol during bolus administration of intravenous contrast. RADIATION DOSE REDUCTION: This exam was performed according to the departmental dose-optimization program which includes automated exposure control, adjustment of the mA and/or kV according to patient size and/or use of iterative reconstruction technique. CONTRAST:  80mL OMNIPAQUE IOHEXOL 350 MG/ML SOLN COMPARISON:  Chest x-ray earlier 02/11/2023 and older FINDINGS: CTA CHEST FINDINGS Cardiovascular: No segmental or larger pulmonary embolism identified. Heart is mildly enlarged. Small pericardial effusion. The thoracic aorta has a normal course and caliber with mild atherosclerotic calcified plaque. Mediastinum/Nodes: No specific abnormal lymph node enlargement identified in the axillary regions. There are several prominent small mediastinal and hilar lymph nodes. More numerous than usually seen but predominantly less than 1 cm in size in short axis and not pathologic by size criteria. Small hiatal hernia. Preserved thyroid gland. Lungs/Pleura: Small right and tiny left pleural effusion. Diffuse patchy ground-glass opacity seen in the lungs with some areas of interstitial thickening. Please  correlate for a component of edema. There is a consolidative opacity as well in the right lower lobe. Possible separate infiltrate or pneumonia. Associated areas of bronchial wall thickening bilaterally. Greatest in the right lower lobe. Musculoskeletal: Mild degenerative changes along the spine. Review of the MIP images confirms the above findings. CT ABDOMEN and PELVIS FINDINGS Hepatobiliary: No focal liver abnormality is seen. No gallstones, gallbladder wall thickening, or biliary dilatation. Pancreas: Unremarkable. No pancreatic ductal dilatation or surrounding inflammatory changes. Spleen: Normal in size without focal abnormality. Adrenals/Urinary Tract: Adrenal glands are unremarkable. Kidneys are normal, without renal calculi, focal lesion, or hydronephrosis. Bladder is unremarkable. Stomach/Bowel: Large amount of colonic stool. The bowel is nondilated. No oral contrast. Vascular/Lymphatic: Aortic atherosclerosis. No enlarged abdominal or pelvic lymph nodes. Reproductive: Prostate is unremarkable. Other: Evaluation limited by motion throughout the examination. No free air or free fluid. Musculoskeletal: Scattered degenerative  changes of the spine and pelvis. Review of the MIP images confirms the above findings. IMPRESSION: Evaluation limited by motion particularly of the abdomen and pelvis. No segmental or larger pulmonary embolism. Small right and tiny left effusion with consolidative opacity along the right lower lobe. Possible pneumonia. Recommend follow-up. Separate areas of patchy ground-glass interstitial septal thickening. Please correlate for any other etiology including a component of edema. Large amount of colonic stool. No bowel obstruction, free air or free fluid. Electronically Signed   By: Karen Kays M.D.   On: 02/11/2023 10:39   DG Chest 2 View  Result Date: 02/11/2023 CLINICAL DATA:  Chest pain and shortness of breath. EXAM: CHEST - 2 VIEW COMPARISON:  10/27/2017. FINDINGS: Low lung  volumes accentuate the pulmonary vasculature and cardiomediastinal silhouette. No consolidation or pulmonary edema. No pleural effusion or pneumothorax. Visualized bones and upper abdomen are unremarkable. IMPRESSION: No evidence of acute cardiopulmonary disease. Electronically Signed   By: Orvan Falconer M.D.   On: 02/11/2023 08:57    Scheduled Inpatient Medications:    atorvastatin  10 mg Oral Daily   bisacodyl  10 mg Oral Once   Fe Fum-Vit C-Vit B12-FA  1 capsule Oral QPC breakfast   insulin aspart  0-15 Units Subcutaneous TID WC   insulin aspart  0-5 Units Subcutaneous QHS   multivitamin with minerals  1 tablet Oral Daily   pantoprazole (PROTONIX) IV  40 mg Intravenous Q12H   polyethylene glycol  17 g Oral Daily   polyethylene glycol powder  1 Container Oral Once    Continuous Inpatient Infusions:    sodium chloride     azithromycin 500 mg (02/12/23 1116)   cefTRIAXone (ROCEPHIN)  IV 2 g (02/12/23 1331)    PRN Inpatient Medications:  acetaminophen **OR** acetaminophen, hydrALAZINE  Miscellaneous: Not applicable  Assessment:  #1.  Symptomatic anemia-possible occult GI bleeding.  Given lack of GI symptoms, cannot differentiate upper versus lower GI source.  Baseline hemoglobin was 13, now 7.6. 2.  Right lower lobe pneumonia with mild hypoxia, stable. 3.  Elevated troponin. No chest pain currently. Cleared by DR. Amin of medicine who does not believe patient has an ischemic cardiac event.  Plan:  Discuss with Attending physician. Done. Tentatively plan EGD and colonoscopy for tomorrow. The patient understands the nature of the planned procedure, indications, risks, alternatives and potential complications including but not limited to bleeding, infection, perforation, damage to internal organs and possible oversedation/side effects from anesthesia. The patient agrees and gives consent to proceed.  Please refer to procedure notes for findings, recommendations and patient  disposition/instructions.  Caspar Favila K. Norma Fredrickson, M.D. 02/12/2023, 2:25 PM

## 2023-02-12 NOTE — Hospital Course (Addendum)
Victor Phillips is a 67 y.o. male with medical history significant of type 2 diabetes and hypertension came to ED with complaint of intermittent chest pain and worsening fatigue and weakness for the past few week.   Patient saw his PCP for worsening fatigue and weakness for the past few weeks, found to have hemoglobin of 8.2.  He was started on iron supplement and CT chest, abdomen and pelvis was ordered.  Labs done during that visit was mostly normal which include A1c of 5.7, normal TSH and low B12 at 286.   Due to worsening weakness and fatigue he presented to ED.  ED course and data reviewed.  Vital stable except mild hypoxia at 87% requiring 2 L of oxygen.  Labs pertinent for hemoglobin of 6.8, MCV 106, BUN 35, creatinine 1.43, albumin 2.5, GFR 54, BNP 267, COVID, flu and RSV negative, troponin 25>30, LA 1.0. Chest x-ray was negative for any acute abnormality. CTA chest was negative for PE but did show small right and tiny left effusions with consolidative opacity along the right lower lobe, possible pneumonia.  Also noted to have separate areas of patchy groundglass and interstitial septal thickening which can be due to component of her edema. CT abdomen and pelvis was motion degraded but only shows large amount of colonic stool.   EKG.  Personally reviewed.  Normal sinus rhythm, right axis and T wave inversion in lead III and aVF.  No other significant abnormality.   Patient was started on Zithromax and ceftriaxone in ED.  Also ordered 1 unit of PRBC.  6/1: Hemodynamically stable.  Procalcitonin negative.  Hemoglobin improved to 7.6 after 1 unit of PRBC.  GI planning EGD and colonoscopy tomorrow.  Pulmonary was also consulted for significant right lower lobe opacity/infiltrate.  Might need further diagnostic procedure.  6/2: Remains stable.  Initially anesthesia refused due to incomplete cardiac workup and the patient with no prior cardiac history.  Cardiology was consulted for clearance and  urgent echo was done by cardiology which was essentially normal.  Now going for EGD and colonoscopy later today.  No upper respiratory symptoms.  Respiratory viral panel negative.  Lipid panel normal with LDL of 22 and HDL of 24.  6/3: Vitals stable.  Hemoglobin at 8.  Patient had a repeat colonoscopy today due to insufficient prep yesterday.  It was negative for any significant abnormality.  EGD yesterday shows mild gastritis.  Patient was advised to stop using NSAID and he will follow-up with GI as outpatient for capsule endoscopy.  CRP which was done for the past 3 days is still pending, apparently it was sent to [facility] due to machine being down at [facility].  A1c at 5.7.  Improving cough.  Patient is being discharged on 3 more days of Augmentin and need to have a close follow-up with outpatient pulmonary for that concern of infiltrate.  If he continues to drop hemoglobin without any obvious bleeding, will need to be evaluated by a hematologist.  Patient is being discharged at his request, he will continue on current medications and need to have a close follow-up with his providers for further recommendations.

## 2023-02-12 NOTE — Assessment & Plan Note (Signed)
Most likely secondary to worsening anemia. -PT evaluation

## 2023-02-12 NOTE — Plan of Care (Signed)

## 2023-02-12 NOTE — Consult Note (Signed)
PULMONOLOGY         Date: 02/12/2023,   MRN# [id number] Victor Phillips 10/10/1955     Admission                  Current   Referring provider: Dr. Nelson Chimes   CHIEF COMPLAINT:   Acute on chronic hypoxemic respiratory failure   HISTORY OF PRESENT ILLNESS   This is a pleasant 67 year old male with a history of diabetes and essential hypertension who came in with complaints of chest pain and fatigue, his fatigue progressively gotten worse he saw his primary care doctor was noted to have blood work with significant anemia previously was around 13 and now was around 7 and actually less than 7 at 6.8 on admission.  He does have an AKI mild BNP elevation troponin was flat fluid lactate was flat, chest x-ray did show a right sided consolidated infiltrate negative for PE.  Initially was hypoxemic and 87% improved with supplemental oxygen does not have a history of COPD or smoking.  He was treated for community-acquired pneumonia with Zithromax Rocephin.  So far workup is negative, COVID, RSV and flu is negative.  Legionella and sputum cultures are in process.   PAST MEDICAL HISTORY   Past Medical History:  Diagnosis Date   Diabetes mellitus without complication (HCC)    Hypertension      SURGICAL HISTORY   History reviewed. No pertinent surgical history.   FAMILY HISTORY   History reviewed. No pertinent family history.   SOCIAL HISTORY   Social History   Tobacco Use   Smoking status: Never   Smokeless tobacco: Never  Substance Use Topics   Alcohol use: No   Drug use: No     MEDICATIONS    Home Medication:    Current Medication:  Current Facility-Administered Medications:    acetaminophen (TYLENOL) tablet 650 mg, 650 mg, Oral, Q6H PRN **OR** acetaminophen (TYLENOL) suppository 650 mg, 650 mg, Rectal, Q6H PRN, Arnetha Courser, MD   atorvastatin (LIPITOR) tablet 10 mg, 10 mg, Oral, Daily, Amin, Tilman Neat, MD, 10 mg at 02/12/23 1058   azithromycin (ZITHROMAX)  500 mg in sodium chloride 0.9 % 250 mL IVPB, 500 mg, Intravenous, Q24H, Amin, Tilman Neat, MD, Last Rate: 250 mL/hr at 02/12/23 1116, 500 mg at 02/12/23 1116   cefTRIAXone (ROCEPHIN) 2 g in sodium chloride 0.9 % 100 mL IVPB, 2 g, Intravenous, Q24H, Amin, Tilman Neat, MD   Fe Fum-Vit C-Vit B12-FA (TRIGELS-F FORTE) capsule 1 capsule, 1 capsule, Oral, QPC breakfast, Amin, Tilman Neat, MD, 1 capsule at 02/12/23 1058   hydrALAZINE (APRESOLINE) tablet 25 mg, 25 mg, Oral, Q8H PRN, Arnetha Courser, MD   insulin aspart (novoLOG) injection 0-15 Units, 0-15 Units, Subcutaneous, TID WC, Arnetha Courser, MD, 3 Units at 02/11/23 1722   insulin aspart (novoLOG) injection 0-5 Units, 0-5 Units, Subcutaneous, QHS, Amin, Tilman Neat, MD   multivitamin with minerals tablet 1 tablet, 1 tablet, Oral, Daily, Arnetha Courser, MD, 1 tablet at 02/12/23 1058   pantoprazole (PROTONIX) injection 40 mg, 40 mg, Intravenous, Q12H, Amin, Sumayya, MD, 40 mg at 02/12/23 1057   polyethylene glycol (MIRALAX / GLYCOLAX) packet 17 g, 17 g, Oral, Daily, Arnetha Courser, MD, 17 g at 02/12/23 1057    ALLERGIES   Patient has no known allergies.     REVIEW OF SYSTEMS    Review of Systems:  Gen:  Denies  fever, sweats, chills weigh loss  HEENT: Denies blurred vision, double vision, ear pain, eye pain, hearing loss,  nose bleeds, sore throat Cardiac:  No dizziness, chest pain or heaviness, chest tightness,edema Resp:   reports dyspnea chronically  Gi: Denies swallowing difficulty, stomach pain, nausea or vomiting, diarrhea, constipation, bowel incontinence Gu:  Denies bladder incontinence, burning urine Ext:   Denies Joint pain, stiffness or swelling Skin: Denies  skin rash, easy bruising or bleeding or hives Endoc:  Denies polyuria, polydipsia , polyphagia or weight change Psych:   Denies depression, insomnia or hallucinations   Other:  All other systems negative   VS: BP (!) 151/85 (BP Location: Left Arm)   Pulse 82   Temp 98.5 F (36.9 C)    Resp 18   Ht 5\' 8"  (1.727 m)   SpO2 92%   BMI 27.98 kg/m      PHYSICAL EXAM    GENERAL:NAD, no fevers, chills, no weakness no fatigue HEAD: Normocephalic, atraumatic.  EYES: Pupils equal, round, reactive to light. Extraocular muscles intact. No scleral icterus.  MOUTH: Moist mucosal membrane. Dentition intact. No abscess noted.  EAR, NOSE, THROAT: Clear without exudates. No external lesions.  NECK: Supple. No thyromegaly. No nodules. No JVD.  PULMONARY: decreased breath sounds with mild rhonchi worse at bases bilaterally.  CARDIOVASCULAR: S1 and S2. Regular rate and rhythm. No murmurs, rubs, or gallops. No edema. Pedal pulses 2+ bilaterally.  GASTROINTESTINAL: Soft, nontender, nondistended. No masses. Positive bowel sounds. No hepatosplenomegaly.  MUSCULOSKELETAL: No swelling, clubbing, or edema. Range of motion full in all extremities.  NEUROLOGIC: Cranial nerves II through XII are intact. No gross focal neurological deficits. Sensation intact. Reflexes intact.  SKIN: No ulceration, lesions, rashes, or cyanosis. Skin warm and dry. Turgor intact.  PSYCHIATRIC: Mood, affect within normal limits. The patient is awake, alert and oriented x 3. Insight, judgment intact.       IMAGING     ASSESSMENT/PLAN   Acute hypoxemic respiratory failure - present on admission  - COVID19 negative   - supplemental O2 during my evaluation 2L/min  -patient may have had alveolar hemorrhage due to reduced Hb from appx 13 to 7 unclear chronicity -will attempt trial of lasix today due to GGO bilaterally and pleural effusion on right suggestive of fluid overload in and around lungs -Respiratory viral panel -serum fungitell -legionella ab -strep pneumoniae ur AG -Histoplasma Ur Ag -sputum resp cultures -AFB sputum expectorated specimen -sputum cytology  -reviewed pertinent imaging with patient today - ESR/CRP -PT/OT for d/c planning  -please encourage patient to use incentive spirometer few  times each hour while hospitalized.                Thank you for allowing me to participate in the care of this patient.   Patient/Family are satisfied with care plan and all questions have been answered.    Provider disclosure: Patient with at least one acute or chronic illness or injury that poses a threat to life or bodily function and is being managed actively during this encounter.  All of the below services have been performed independently by signing provider:  review of prior documentation from internal and or external health records.  Review of previous and current lab results.  Interview and comprehensive assessment during patient visit today. Review of current and previous chest radiographs/CT scans. Discussion of management and test interpretation with health care team and patient/family.   This document was prepared using Dragon voice recognition software and may include unintentional dictation errors.     Vida Rigger, M.D.  Division of Pulmonary & Critical Care Medicine

## 2023-02-12 NOTE — Progress Notes (Signed)
Progress Note   Patient: Victor Phillips:[id number] DOB: 1956/06/16 DOA: 02/11/2023     1 DOS: the patient was seen and examined on 02/12/2023   Brief hospital course:  Victor Phillips is a 67 y.o. male with medical history significant of type 2 diabetes and hypertension came to ED with complaint of intermittent chest pain and worsening fatigue and weakness for the past few week.   Patient saw his PCP for worsening fatigue and weakness for the past few weeks, found to have hemoglobin of 8.2.  He was started on iron supplement and CT chest, abdomen and pelvis was ordered.  Labs done during that visit was mostly normal which include A1c of 5.7, normal TSH and low B12 at 286.   Due to worsening weakness and fatigue he presented to ED.  ED course and data reviewed.  Vital stable except mild hypoxia at 87% requiring 2 L of oxygen.  Labs pertinent for hemoglobin of 6.8, MCV 106, BUN 35, creatinine 1.43, albumin 2.5, GFR 54, BNP 267, COVID, flu and RSV negative, troponin 25>30, LA 1.0. Chest x-ray was negative for any acute abnormality. CTA chest was negative for PE but did show small right and tiny left effusions with consolidative opacity along the right lower lobe, possible pneumonia.  Also noted to have separate areas of patchy groundglass and interstitial septal thickening which can be due to component of her edema. CT abdomen and pelvis was motion degraded but only shows large amount of colonic stool.   EKG.  Personally reviewed.  Normal sinus rhythm, right axis and T wave inversion in lead III and aVF.  No other significant abnormality.   Patient was started on Zithromax and ceftriaxone in ED.  Also ordered 1 unit of PRBC.  6/1: Hemodynamically stable.  Procalcitonin negative.  Hemoglobin improved to 7.6 after 1 unit of PRBC.  GI planning EGD and colonoscopy tomorrow.  Pulmonary was also consulted for significant right lower lobe opacity/infiltrate.  Might need further diagnostic  procedure.  Assessment and Plan: * Acute hypoxic respiratory failure (HCC) Patient was hypoxic up to 87% on room air, requiring up to 2 L of oxygen with no prior oxygen use.  No history of smoking or COPD.  Currently on room air at rest Concern of right lower lobe infiltrate and some surrounding opacities on CT chest.  CTA negative for PE, procalcitonin negative -Continue supplemental oxygen-wean as tolerated -Consulted pulmonology  CAP (community acquired pneumonia) CT chest concerning for a large right lower lobe infiltrate.  Patient to have some cough for the past few days.  No fever or chills.  No leukocytosis.  COVID, influenza and RSV negative.  CRP and respiratory viral panel was ordered by pulmonary.  Procalcitonin negative. Pulmonary was consulted as he might need more diagnostic procedure -Started him on ceftriaxone and Zithromax -Check strep pneumo and Legionella antigen -Patient will need a repeat CT chest in 4 to 6 weeks to see the resolution of these findings.  Anemia Patient with worsening fatigue for the past few week.  Saw PCP and found to have hemoglobin of 8.2 on 5/16.  No obvious bleeding but do use ibuprofen on a daily basis.  Hemoglobin decreased to 6.8 today.  Anemia panel with some iron deficiency and anemia of chronic disease.  Recent check of B12 at PCP office on 5/16 at 236. Patient also has macrocytosis.  Folate looks normal. Also has an history of unintentional weight loss over the past few month. -1 unit of PRBC  ordered. -GI was consulted-going for EGD and colonoscopy tomorrow -Avoid NSAIDs -IV protonix  Elevated troponin Intermittent chest pain. Patient has some intermittent atypical chest pain, there is some exertional component, nonradiating and no associated factors. Troponin barely positive with a flat curve. CT chest with concerning of bilateral small pleural effusions and groundglass opacities which can be due to pulmonary edema. BNP at 267.  No  peripheral edema.  No prior cardiac history -Echocardiogram -Monitor volume status  AKI (acute kidney injury) (HCC) Creatinine at 1.43>>1.31 with baseline appears to be around 1.2.  Mild AKI with decrease of GFR to 54.  Did receive contrast with CTA -Gentle IV fluid -Renal ultrasound-with mild bilateral renal atrophy.  No collecting system dilatation -Monitor renal function -Avoid nephrotoxins -Holding home HCTZ and irbesartan  Essential hypertension Patient uses olmesartan and HCTZ at home. -Holding home antihypertensives due to AKI -As needed hydralazine  Weakness Most likely secondary to worsening anemia. -PT evaluation  Hypercholesteremia -Continue home statin  Type 2 diabetes mellitus (HCC) Patient was on metformin and Amaryl at home.  A1c of 5.7 -SSI   Subjective: Patient was seen and examined today.  On room air.  No new concern.  Physical Exam: Vitals:   02/11/23 1605 02/11/23 1703 02/12/23 0021 02/12/23 0945  BP: 139/83 (!) 147/74 (!) 149/84 (!) 151/85  Pulse: 82 86 81 82  Resp: 18 19 18 18   Temp: 97.8 F (36.6 C) 97.8 F (36.6 C) 97.8 F (36.6 C) 98.5 F (36.9 C)  TempSrc:      SpO2: 99% 99% 98% 92%  Height:       General.  Well-developed gentleman, in no acute distress. Pulmonary.  Lungs clear bilaterally, normal respiratory effort. CV.  Regular rate and rhythm, no JVD, rub or murmur. Abdomen.  Soft, nontender, nondistended, BS positive. CNS.  Alert and oriented .  No focal neurologic deficit. Extremities.  No edema, no cyanosis, pulses intact and symmetrical. Psychiatry.  Judgment and insight appears normal.   Data Reviewed: Prior data reviewed  Family Communication: Discussed with patient  Disposition: Status is: Inpatient Remains inpatient appropriate because: Severity of illness  Planned Discharge Destination: Home  Time spent: 45 minutes  This record has been created using Conservation officer, historic buildings. Errors have been sought and  corrected,but may not always be located. Such creation errors do not reflect on the standard of care.   Author: Arnetha Courser, MD 02/12/2023 2:09 PM  For on call review [email or web address].

## 2023-02-13 ENCOUNTER — Inpatient Hospital Stay: Payer: Medicare HMO

## 2023-02-13 ENCOUNTER — Encounter
Admission: EM | Disposition: A | Discharge status: Home / Self Care | Payer: Self-pay | Source: Home / Self Care | Attending: Internal Medicine

## 2023-02-13 ENCOUNTER — Inpatient Hospital Stay: Payer: Medicare HMO | Admitting: Anesthesiology

## 2023-02-13 ENCOUNTER — Inpatient Hospital Stay (HOSPITAL_COMMUNITY)
Admit: 2023-02-13 | Discharge: 2023-02-13 | Disposition: A | Discharge status: Home / Self Care | Payer: Medicare HMO | Attending: Medical | Admitting: Medical

## 2023-02-13 DIAGNOSIS — I700 Atherosclerosis of aorta: Secondary | ICD-10-CM | POA: Diagnosis not present

## 2023-02-13 DIAGNOSIS — D649 Anemia, unspecified: Secondary | ICD-10-CM | POA: Diagnosis not present

## 2023-02-13 DIAGNOSIS — I5032 Chronic diastolic (congestive) heart failure: Secondary | ICD-10-CM

## 2023-02-13 DIAGNOSIS — I519 Heart disease, unspecified: Secondary | ICD-10-CM

## 2023-02-13 DIAGNOSIS — Z01810 Encounter for preprocedural cardiovascular examination: Secondary | ICD-10-CM | POA: Diagnosis not present

## 2023-02-13 DIAGNOSIS — R079 Chest pain, unspecified: Secondary | ICD-10-CM

## 2023-02-13 DIAGNOSIS — I10 Essential (primary) hypertension: Secondary | ICD-10-CM | POA: Diagnosis not present

## 2023-02-13 DIAGNOSIS — J9601 Acute respiratory failure with hypoxia: Secondary | ICD-10-CM | POA: Diagnosis not present

## 2023-02-13 DIAGNOSIS — E119 Type 2 diabetes mellitus without complications: Secondary | ICD-10-CM | POA: Diagnosis not present

## 2023-02-13 DIAGNOSIS — J189 Pneumonia, unspecified organism: Secondary | ICD-10-CM | POA: Diagnosis not present

## 2023-02-13 HISTORY — PX: ESOPHAGOGASTRODUODENOSCOPY: SHX5428

## 2023-02-13 HISTORY — PX: COLONOSCOPY: SHX5424

## 2023-02-13 LAB — CBC
HCT: 23.9 % — ABNORMAL LOW (ref 39.0–52.0)
Hemoglobin: 8.1 g/dL — ABNORMAL LOW (ref 13.0–17.0)
MCH: 33.2 pg (ref 26.0–34.0)
MCHC: 33.9 g/dL (ref 30.0–36.0)
MCV: 98 fL (ref 80.0–100.0)
Platelets: 177 10*3/uL (ref 150–400)
RBC: 2.44 MIL/uL — ABNORMAL LOW (ref 4.22–5.81)
RDW: 15.6 % — ABNORMAL HIGH (ref 11.5–15.5)
WBC: 5.2 10*3/uL (ref 4.0–10.5)
nRBC: 0 % (ref 0.0–0.2)

## 2023-02-13 LAB — RESPIRATORY PANEL BY PCR

## 2023-02-13 LAB — GLUCOSE, CAPILLARY
Glucose-Capillary: 122 mg/dL — ABNORMAL HIGH (ref 70–99)
Glucose-Capillary: 94 mg/dL (ref 70–99)
Glucose-Capillary: 70 mg/dL (ref 70–99)

## 2023-02-13 LAB — LIPID PANEL
Cholesterol: 61 mg/dL (ref 0–200)
HDL: 24 mg/dL — ABNORMAL LOW (ref >40)
LDL Cholesterol: 22 mg/dL (ref 0–99)
Total CHOL/HDL Ratio: 2.5 RATIO
Triglycerides: 74 mg/dL (ref <150)
VLDL: 15 mg/dL (ref 0–40)

## 2023-02-13 LAB — BASIC METABOLIC PANEL
Anion gap: 9 (ref 5–15)
BUN: 33 mg/dL — ABNORMAL HIGH (ref 8–23)
CO2: 25 mmol/L (ref 22–32)
Calcium: 8.7 mg/dL — ABNORMAL LOW (ref 8.9–10.3)
Chloride: 100 mmol/L (ref 98–111)
Creatinine, Ser: 1.41 mg/dL — ABNORMAL HIGH (ref 0.61–1.24)
GFR, Estimated: 55 mL/min — ABNORMAL LOW (ref >60)
Glucose, Bld: 90 mg/dL (ref 70–99)
Potassium: 4.5 mmol/L (ref 3.5–5.1)
Sodium: 134 mmol/L — ABNORMAL LOW (ref 135–145)

## 2023-02-13 LAB — ECHOCARDIOGRAM COMPLETE
AR max vel: 2.64 cm2
AV Area VTI: 2.58 cm2
AV Area mean vel: 2.58 cm2
AV Mean grad: 5 mmHg
AV Peak grad: 9.6 mmHg
Ao pk vel: 1.55 m/s
Area-P 1/2: 3.85 cm2
Height: 68 in
S' Lateral: 3.4 cm
Weight: 2736 oz

## 2023-02-13 LAB — CULTURE, BLOOD (ROUTINE X 2)

## 2023-02-13 SURGERY — EGD (ESOPHAGOGASTRODUODENOSCOPY)
Anesthesia: General

## 2023-02-13 MED ORDER — PROPOFOL 10 MG/ML IV BOLUS
INTRAVENOUS | Status: DC | PRN
  Administered 2023-02-13: 60 mg via INTRAVENOUS

## 2023-02-13 MED ORDER — LIDOCAINE HCL (CARDIAC) PF 100 MG/5ML IV SOSY
PREFILLED_SYRINGE | INTRAVENOUS | Status: DC | PRN
  Administered 2023-02-13: 100 mg via INTRAVENOUS

## 2023-02-13 MED ORDER — PROPOFOL 500 MG/50ML IV EMUL
INTRAVENOUS | Status: DC | PRN
  Administered 2023-02-13: 140 ug/kg/min via INTRAVENOUS

## 2023-02-13 MED ORDER — LACTATED RINGERS IV SOLN: INTRAVENOUS | Status: AC

## 2023-02-13 MED ORDER — PEG 3350-KCL-NA BICARB-NACL 420 G PO SOLR
4000.0000 mL | Freq: Once | ORAL | Status: AC
  Administered 2023-02-13: 4000 mL via ORAL
  Filled 2023-02-13: qty 4000

## 2023-02-13 NOTE — H&P (View-Only) (Signed)
[facility] Inpatient Progress Note    Subjective: Patient seen for pre-op visit for EGD and colonoscopy today. Due to increased troponin and dyspnea, anesthesia service did not feel comfortable proceeding with sedating due to concerns of no echo being performed. Patient denies recurrent bleeding and would like to have the procedures today. Dr [place] of Cardiology was consulted and kindly saw patient and had urgent echo performed (was not routinely offered by hospital  over the weekend) which showed no significant abnormalities. Patient is clear  Objective: Vital signs in last 24 hours: Temp:  [97.3 F (36.3 C)-98.2 F (36.8 C)] 97.6 F (36.4 C) (06/02 0833) Pulse Rate:  [76-85] 85 (06/02 0833) Resp:  [15-18] 18 (06/02 0833) BP: (137-166)/(73-88) 166/88 (06/02 0833) SpO2:  [93 %-97 %] 93 % (06/02 0833) Weight:  [77.6 kg] 77.6 kg (06/01 1418) Blood pressure (!) 166/88, pulse 85, temperature 97.6 F (36.4 C), resp. rate 18, height 5' 8" (1.727 m), weight 77.6 kg, SpO2 93 %.    Intake/Output from previous day: 06/01 0701 - 06/02 0700 In: 535.8 [I.V.:185.8; IV Piggyback:350] Out: 950 [Urine:950]  Intake/Output this shift: No intake/output data recorded.   Gen: NAD. Appears comfortable.  HEENT: Victor Phillips. PERRLA. Normal external ear exam.  Chest: CTA, no wheezes.  CV: RR nl S1, S2. No gallops.  Abd: soft, nt, nd. BS+  Ext: no edema. Pulses 2+  Neuro: Alert and oriented. Judgement appears normal. Nonfocal.   Lab Results: Results for orders placed or performed during the hospital encounter of 02/11/23 (from the past 24 hour(s))  Glucose, capillary     Status: Abnormal   Collection Time: 02/12/23  1:33 PM  Result Value Ref Range   Glucose-Capillary 109 (H) 70 - 99 mg/dL  Respiratory (~20 pathogens) panel by PCR     Status: None   Collection Time: 02/12/23  4:19 PM   Specimen: Nasopharyngeal Swab; Respiratory  Result Value Ref Range   Adenovirus NOT  DETECTED NOT DETECTED   Coronavirus 229E NOT DETECTED NOT DETECTED   Coronavirus HKU1 NOT DETECTED NOT DETECTED   Coronavirus NL63 NOT DETECTED NOT DETECTED   Coronavirus OC43 NOT DETECTED NOT DETECTED   Metapneumovirus NOT DETECTED NOT DETECTED   Rhinovirus / Enterovirus NOT DETECTED NOT DETECTED   Influenza A NOT DETECTED NOT DETECTED   Influenza B NOT DETECTED NOT DETECTED   Parainfluenza Virus 1 NOT DETECTED NOT DETECTED   Parainfluenza Virus 2 NOT DETECTED NOT DETECTED   Parainfluenza Virus 3 NOT DETECTED NOT DETECTED   Parainfluenza Virus 4 NOT DETECTED NOT DETECTED   Respiratory Syncytial Virus NOT DETECTED NOT DETECTED   Bordetella pertussis NOT DETECTED NOT DETECTED   Bordetella Parapertussis NOT DETECTED NOT DETECTED   Chlamydophila pneumoniae NOT DETECTED NOT DETECTED   Mycoplasma pneumoniae NOT DETECTED NOT DETECTED  Glucose, capillary     Status: Abnormal   Collection Time: 02/12/23  4:41 PM  Result Value Ref Range   Glucose-Capillary 135 (H) 70 - 99 mg/dL  Glucose, capillary     Status: Abnormal   Collection Time: 02/12/23  9:03 PM  Result Value Ref Range   Glucose-Capillary 162 (H) 70 - 99 mg/dL  CBC     Status: Abnormal   Collection Time: 02/13/23  3:29 AM  Result Value Ref Range   WBC 5.2 4.0 - 10.5 K/uL   RBC 2.44 (L) 4.22 - 5.81 MIL/uL   Hemoglobin 8.1 (L) 13.0 - 17.0 g/dL   HCT 23.9 (L) 39.0 - 52.0 %     MCV 98.0 80.0 - 100.0 fL   MCH 33.2 26.0 - 34.0 pg   MCHC 33.9 30.0 - 36.0 g/dL   RDW 15.6 (H) 11.5 - 15.5 %   Platelets 177 150 - 400 K/uL   nRBC 0.0 0.0 - 0.2 %  Basic metabolic panel     Status: Abnormal   Collection Time: 02/13/23  3:29 AM  Result Value Ref Range   Sodium 134 (L) 135 - 145 mmol/L   Potassium 4.5 3.5 - 5.1 mmol/L   Chloride 100 98 - 111 mmol/L   CO2 25 22 - 32 mmol/L   Glucose, Bld 90 70 - 99 mg/dL   BUN 33 (H) 8 - 23 mg/dL   Creatinine, Ser 1.41 (H) 0.61 - 1.24 mg/dL   Calcium 8.7 (L) 8.9 - 10.3 mg/dL   GFR, Estimated 55 (L) >60  mL/min   Anion gap 9 5 - 15  Lipid panel     Status: Abnormal   Collection Time: 02/13/23  3:29 AM  Result Value Ref Range   Cholesterol 61 0 - 200 mg/dL   Triglycerides 74 <150 mg/dL   HDL 24 (L) >40 mg/dL   Total CHOL/HDL Ratio 2.5 RATIO   VLDL 15 0 - 40 mg/dL   LDL Cholesterol 22 0 - 99 mg/dL  Glucose, capillary     Status: Abnormal   Collection Time: 02/13/23  8:37 AM  Result Value Ref Range   Glucose-Capillary 122 (H) 70 - 99 mg/dL  Glucose, capillary     Status: None   Collection Time: 02/13/23 11:49 AM  Result Value Ref Range   Glucose-Capillary 94 70 - 99 mg/dL     Recent Labs    02/11/23 0905 02/12/23 0326 02/13/23 0329  WBC 5.8 6.0 5.2  HGB 6.8* 7.6* 8.1*  HCT 21.2* 23.2* 23.9*  PLT 165 157 177   BMET Recent Labs    02/11/23 0905 02/12/23 0326 02/13/23 0329  NA 133* 135 134*  K 4.6 4.4 4.5  CL 104 104 100  CO2 22 25 25  GLUCOSE 181* 71 90  BUN 35* 34* 33*  CREATININE 1.43* 1.31* 1.41*  CALCIUM 8.2* 8.6* 8.7*   LFT Recent Labs    02/11/23 0905  PROT 9.5*  ALBUMIN 2.5*  AST 22  ALT 23  ALKPHOS 70  BILITOT 0.5   PT/INR Recent Labs    02/11/23 0906  LABPROT 16.1*  INR 1.3*   Hepatitis Panel No results for input(s): "HEPBSAG", "HCVAB", "HEPAIGM", "HEPBIGM" in the last 72 hours. C-Diff No results for input(s): "CDIFFTOX" in the last 72 hours. No results for input(s): "CDIFFPCR" in the last 72 hours.   Studies/Results: ECHOCARDIOGRAM COMPLETE  Result Date: 02/13/2023    ECHOCARDIOGRAM REPORT   Patient Name:   Victor Phillips Date of Exam: 02/13/2023 Medical Rec #:  [id number]        Height:       68.0 in Accession #:    [id number]       Weight:       171.0 lb Date of Birth:  10/15/1955       BSA:          1.912 m Patient Age:    66 years         BP:           166/88 mmHg Patient Gender: M                HR:             86 bpm. Exam Location:  [facility] Procedure: 2D Echo, Cardiac Doppler, Color Doppler and Strain Analysis Indications:    Chest Pain  R07.9  History:        Patient has no prior history of Echocardiogram examinations.                 Signs/Symptoms:Chest Pain; Risk Factors:Hypertension, Diabetes,                 Dyslipidemia and Non-Smoker.  Sonographer:    Gina Gealow RVT RCS Referring Phys: [id number] CADENCE H FURTH IMPRESSIONS  1. Left ventricular ejection fraction, by estimation, is 60 to 65%. The left ventricle has normal function. The left ventricle has no regional wall motion abnormalities. Left ventricular diastolic parameters are consistent with Grade II diastolic dysfunction (pseudonormalization). Elevated left ventricular end-diastolic pressure. The average left ventricular global longitudinal strain is -19.2 %. The global longitudinal strain is normal.  2. Right ventricular systolic function is normal. The right ventricular size is normal. There is normal pulmonary artery systolic pressure.  3. The mitral valve is normal in structure. Trivial mitral valve regurgitation. No evidence of mitral stenosis.  4. The aortic valve is tricuspid. Aortic valve regurgitation is not visualized. No aortic stenosis is present.  5. There is dilatation of the ascending aorta, measuring 38 mm.  6. The inferior vena cava is dilated in size with >50% respiratory variability, suggesting right atrial pressure of 8 mmHg. FINDINGS  Left Ventricle: Left ventricular ejection fraction, by estimation, is 60 to 65%. The left ventricle has normal function. The left ventricle has no regional wall motion abnormalities. The average left ventricular global longitudinal strain is -19.2 %. The global longitudinal strain is normal. The left ventricular internal cavity size was normal in size. There is no left ventricular hypertrophy. Left ventricular diastolic parameters are consistent with Grade II diastolic dysfunction (pseudonormalization). Elevated left ventricular end-diastolic pressure. Right Ventricle: The right ventricular size is normal. No increase in right  ventricular wall thickness. Right ventricular systolic function is normal. There is normal pulmonary artery systolic pressure. The tricuspid regurgitant velocity is 2.23 m/s, and  with an assumed right atrial pressure of 8 mmHg, the estimated right ventricular systolic pressure is 27.9 mmHg. Left Atrium: Left atrial size was normal in size. Right Atrium: Right atrial size was normal in size. Pericardium: There is no evidence of pericardial effusion. Mitral Valve: The mitral valve is normal in structure. Trivial mitral valve regurgitation. No evidence of mitral valve stenosis. Tricuspid Valve: The tricuspid valve is normal in structure. Tricuspid valve regurgitation is trivial. No evidence of tricuspid stenosis. Aortic Valve: The aortic valve is tricuspid. Aortic valve regurgitation is not visualized. No aortic stenosis is present. Aortic valve mean gradient measures 5.0 mmHg. Aortic valve peak gradient measures 9.6 mmHg. Aortic valve area, by VTI measures 2.58 cm. Pulmonic Valve: The pulmonic valve was normal in structure. Pulmonic valve regurgitation is trivial. No evidence of pulmonic stenosis. Aorta: The aortic root is normal in size and structure. There is dilatation of the ascending aorta, measuring 38 mm. Venous: The inferior vena cava is dilated in size with greater than 50% respiratory variability, suggesting right atrial pressure of 8 mmHg. IAS/Shunts: No atrial level shunt detected by color flow Doppler.  LEFT VENTRICLE PLAX 2D LVIDd:         5.10 cm   Diastology LVIDs:         3.40 cm   LV e' medial:    6.12 cm/s LV PW:           1.10 cm   LV E/e' medial:  19.0 LV IVS:        1.00 cm   LV e' lateral:   9.28 cm/s LVOT diam:     2.10 cm   LV E/e' lateral: 12.5 LV SV:         84 LV SV Index:   44        2D Longitudinal Strain LVOT Area:     3.46 cm  2D Strain GLS (A2C):   -16.9 %                          2D Strain GLS (A3C):   -18.2 %                          2D Strain GLS (A4C):   -22.6 %                           2D Strain GLS Avg:     -19.2 % RIGHT VENTRICLE RV Basal diam:  3.30 cm RV S prime:     10.80 cm/s TAPSE (M-mode): 2.0 cm LEFT ATRIUM             Index        RIGHT ATRIUM           Index LA diam:        4.20 cm 2.20 cm/m   RA Area:     12.80 cm LA Vol (A2C):   47.8 ml 25.00 ml/m  RA Volume:   26.60 ml  13.91 ml/m LA Vol (A4C):   40.2 ml 21.05 ml/m LA Biplane Vol: 54.0 ml 28.24 ml/m  AORTIC VALVE                     PULMONIC VALVE AV Area (Vmax):    2.64 cm      PV Vmax:          1.41 m/s AV Area (Vmean):   2.58 cm      PV Peak grad:     8.0 mmHg AV Area (VTI):     2.58 cm      PR End Diast Vel: 5.11 msec AV Vmax:           155.00 cm/s AV Vmean:          107.000 cm/s AV VTI:            0.326 m AV Peak Grad:      9.6 mmHg AV Mean Grad:      5.0 mmHg LVOT Vmax:         118.00 cm/s LVOT Vmean:        79.700 cm/s LVOT VTI:          0.243 m LVOT/AV VTI ratio: 0.75  AORTA Ao Root diam: 3.20 cm Ao Asc diam:  3.80 cm MITRAL VALVE                TRICUSPID VALVE MV Area (PHT): 3.85 cm     TR Peak grad:   19.9 mmHg MV Decel Time: 197 msec     TR Vmax:        223.00 cm/s MV E velocity: 116.00 cm/s MV A velocity: 71.10 cm/s   SHUNTS MV E/A ratio:  1.63         Systemic VTI:  0.24 m                               Systemic Diam: 2.10 cm Tiffany [place] MD Electronically signed by Tiffany Duson MD Signature Date/Time: 02/13/2023/10:42:45 AM    Final    US RENAL  Result Date: 02/11/2023 CLINICAL DATA:  Acute kidney insufficiency EXAM: RENAL / URINARY TRACT ULTRASOUND COMPLETE COMPARISON:  CT earlier 02/11/2019 FINDINGS: Right Kidney: Renal measurements: 11.7 x 6.8 x 6.3 cm = volume: 262.4 mL. Mild parenchymal atrophy prominent renal sinus fat. No perinephric fluid. Left Kidney: Renal measurements: 12.0 x 7.1 x 5.6 cm = volume: 250 mL. Mild parenchymal atrophy with prominent renal sinus fat. No collecting system dilatation or perinephric fluid. Bladder: Distended with fluid.  Preserved contour.  Ureteral jets are  seen. Other: None. IMPRESSION: Mild bilateral renal atrophy. No collecting system dilatation. Please correlate with separate contrast CT from same day Electronically Signed   By: Ashok  Gupta M.D.   On: 02/11/2023 15:20    Scheduled Inpatient Medications:    atorvastatin  10 mg Oral Daily   Fe Fum-Vit C-Vit B12-FA  1 capsule Oral QPC breakfast   furosemide  60 mg Intravenous Daily   insulin aspart  0-15 Units Subcutaneous TID WC   insulin aspart  0-5 Units Subcutaneous QHS   multivitamin with minerals  1 tablet Oral Daily   pantoprazole (PROTONIX) IV  40 mg Intravenous Q12H    Continuous Inpatient Infusions:    sodium chloride 20 mL/hr at 02/12/23 1632   azithromycin Stopped (02/12/23 1216)   cefTRIAXone (ROCEPHIN)  IV 2 g (02/13/23 1125)    PRN Inpatient Medications:  acetaminophen **OR** acetaminophen, hydrALAZINE  Miscellaneous: N/A  Assessment:  Symptomatic anemia. Hemoccult positive stool. Elevated troponin, asymptomatic without chest pain. Dyspnea, mild, likely secondary to anemia, pneumonia. Sats stable. AKI - per medicine, stable.  Plan:  After a delay cardiac workup has been completed so we will re-post procedures for today. Care team notified. Appreciate Dr. [place] of cardiology and Dr. Amin of Medicine helping to expedite the care of this patient. Further recommendations per procedure documentation later today.  Cylah Fannin K. Jakyron Fabro, M.D. 02/13/2023, 11:52 AM   

## 2023-02-13 NOTE — Progress Notes (Signed)
PULMONOLOGY         Date: 02/13/2023,   MRN# [id number] Victor Phillips 1955/11/05     AdmissionWeight: 77.6 kg                 CurrentWeight: 77.6 kg  Referring provider: Dr. Nelson Chimes   CHIEF COMPLAINT:   Acute on chronic hypoxemic respiratory failure   HISTORY OF PRESENT ILLNESS   This is a pleasant 67 year old male with a history of diabetes and essential hypertension who came in with complaints of chest pain and fatigue, his fatigue progressively gotten worse he saw his primary care doctor was noted to have blood work with significant anemia previously was around 13 and now was around 7 and actually less than 7 at 6.8 on admission.  He does have an AKI mild BNP elevation troponin was flat fluid lactate was flat, chest x-ray did show a right sided consolidated infiltrate negative for PE.  Initially was hypoxemic and 87% improved with supplemental oxygen does not have a history of COPD or smoking.  He was treated for community-acquired pneumonia with Zithromax Rocephin.  So far workup is negative, COVID, RSV and flu is negative.  Legionella and sputum cultures are in process.  02/13/23- patient had improvement in hb overnight. Had lasix challenge overnight with good output, CXR repeating this am. Reports less dyspnea. S/p EGD and colonoscopy today  PAST MEDICAL HISTORY   Past Medical History:  Diagnosis Date   Diabetes mellitus without complication (HCC)    Hypertension      SURGICAL HISTORY   History reviewed. No pertinent surgical history.   FAMILY HISTORY   History reviewed. No pertinent family history.   SOCIAL HISTORY   Social History   Tobacco Use   Smoking status: Never   Smokeless tobacco: Never  Substance Use Topics   Alcohol use: No   Drug use: No     MEDICATIONS    Home Medication:    Current Medication:  Current Facility-Administered Medications:    0.9 %  sodium chloride infusion, , Intravenous, Continuous, [place], Boykin Nearing, MD,  Last Rate: 20 mL/hr at 02/12/23 1632, New Bag at 02/12/23 1632   acetaminophen (TYLENOL) tablet 650 mg, 650 mg, Oral, Q6H PRN **OR** acetaminophen (TYLENOL) suppository 650 mg, 650 mg, Rectal, Q6H PRN, Arnetha Courser, MD   atorvastatin (LIPITOR) tablet 10 mg, 10 mg, Oral, Daily, Amin, Tilman Neat, MD, 10 mg at 02/12/23 1058   azithromycin (ZITHROMAX) 500 mg in sodium chloride 0.9 % 250 mL IVPB, 500 mg, Intravenous, Q24H, Arnetha Courser, MD, Stopped at 02/12/23 1216   cefTRIAXone (ROCEPHIN) 2 g in sodium chloride 0.9 % 100 mL IVPB, 2 g, Intravenous, Q24H, Arnetha Courser, MD, Stopped at 02/12/23 1401   Fe Fum-Vit C-Vit B12-FA (TRIGELS-F FORTE) capsule 1 capsule, 1 capsule, Oral, QPC breakfast, Amin, Tilman Neat, MD, 1 capsule at 02/12/23 1058   furosemide (LASIX) injection 60 mg, 60 mg, Intravenous, Daily, Karna Christmas, Sharai Overbay, MD, 60 mg at 02/12/23 1810   hydrALAZINE (APRESOLINE) tablet 25 mg, 25 mg, Oral, Q8H PRN, Arnetha Courser, MD   insulin aspart (novoLOG) injection 0-15 Units, 0-15 Units, Subcutaneous, TID WC, Arnetha Courser, MD, 3 Units at 02/11/23 1722   insulin aspart (novoLOG) injection 0-5 Units, 0-5 Units, Subcutaneous, QHS, Amin, Tilman Neat, MD   multivitamin with minerals tablet 1 tablet, 1 tablet, Oral, Daily, Arnetha Courser, MD, 1 tablet at 02/12/23 1058   pantoprazole (PROTONIX) injection 40 mg, 40 mg, Intravenous, Q12H, Arnetha Courser, MD, 40 mg at 02/12/23  2208    ALLERGIES   Patient has no known allergies.     REVIEW OF SYSTEMS    Review of Systems:  Gen:  Denies  fever, sweats, chills weigh loss  HEENT: Denies blurred vision, double vision, ear pain, eye pain, hearing loss, nose bleeds, sore throat Cardiac:  No dizziness, chest pain or heaviness, chest tightness,edema Resp:   reports dyspnea chronically  Gi: Denies swallowing difficulty, stomach pain, nausea or vomiting, diarrhea, constipation, bowel incontinence Gu:  Denies bladder incontinence, burning urine Ext:   Denies Joint pain,  stiffness or swelling Skin: Denies  skin rash, easy bruising or bleeding or hives Endoc:  Denies polyuria, polydipsia , polyphagia or weight change Psych:   Denies depression, insomnia or hallucinations   Other:  All other systems negative   VS: BP (!) 166/88   Pulse 85   Temp 97.6 F (36.4 C)   Resp 18   Ht 5\' 8"  (1.727 m)   Wt 77.6 kg   SpO2 93%   BMI 26.00 kg/m      PHYSICAL EXAM    GENERAL:NAD, no fevers, chills, no weakness no fatigue HEAD: Normocephalic, atraumatic.  EYES: Pupils equal, round, reactive to light. Extraocular muscles intact. No scleral icterus.  MOUTH: Moist mucosal membrane. Dentition intact. No abscess noted.  EAR, NOSE, THROAT: Clear without exudates. No external lesions.  NECK: Supple. No thyromegaly. No nodules. No JVD.  PULMONARY: decreased breath sounds with mild rhonchi worse at bases bilaterally.  CARDIOVASCULAR: S1 and S2. Regular rate and rhythm. No murmurs, rubs, or gallops. No edema. Pedal pulses 2+ bilaterally.  GASTROINTESTINAL: Soft, nontender, nondistended. No masses. Positive bowel sounds. No hepatosplenomegaly.  MUSCULOSKELETAL: No swelling, clubbing, or edema. Range of motion full in all extremities.  NEUROLOGIC: Cranial nerves II through XII are intact. No gross focal neurological deficits. Sensation intact. Reflexes intact.  SKIN: No ulceration, lesions, rashes, or cyanosis. Skin warm and dry. Turgor intact.  PSYCHIATRIC: Mood, affect within normal limits. The patient is awake, alert and oriented x 3. Insight, judgment intact.       IMAGING     ASSESSMENT/PLAN   Acute hypoxemic respiratory failure - present on admission  - COVID19 negative   - supplemental O2 during my evaluation 2L/min  -patient may have had alveolar hemorrhage due to reduced Hb from appx 13 to 7 unclear chronicity -will attempt trial of lasix today due to GGO bilaterally and pleural effusion on right suggestive of fluid overload in and around  lungs -Respiratory viral panel -serum fungitell -legionella ab -strep pneumoniae ur AG -Histoplasma Ur Ag -sputum resp cultures -AFB sputum expectorated specimen -sputum cytology  -reviewed pertinent imaging with patient today - ESR/CRP -PT/OT for d/c planning  -please encourage patient to use incentive spirometer few times each hour while hospitalized.                Thank you for allowing me to participate in the care of this patient.   Patient/Family are satisfied with care plan and all questions have been answered.    Provider disclosure: Patient with at least one acute or chronic illness or injury that poses a threat to life or bodily function and is being managed actively during this encounter.  All of the below services have been performed independently by signing provider:  review of prior documentation from internal and or external health records.  Review of previous and current lab results.  Interview and comprehensive assessment during patient visit today. Review of current and previous  chest radiographs/CT scans. Discussion of management and test interpretation with health care team and patient/family.   This document was prepared using Dragon voice recognition software and may include unintentional dictation errors.     Vida Rigger, M.D.  Division of Pulmonary & Critical Care Medicine

## 2023-02-13 NOTE — Progress Notes (Signed)
Progress Note   Patient: Victor Phillips ZOX:[id number] DOB: 04/26/1956 DOA: 02/11/2023     2 DOS: the patient was seen and examined on 02/13/2023   Brief hospital course:  Victor Phillips is a 67 y.o. male with medical history significant of type 2 diabetes and hypertension came to ED with complaint of intermittent chest pain and worsening fatigue and weakness for the past few week.   Patient saw his PCP for worsening fatigue and weakness for the past few weeks, found to have hemoglobin of 8.2.  He was started on iron supplement and CT chest, abdomen and pelvis was ordered.  Labs done during that visit was mostly normal which include A1c of 5.7, normal TSH and low B12 at 286.   Due to worsening weakness and fatigue he presented to ED.  ED course and data reviewed.  Vital stable except mild hypoxia at 87% requiring 2 L of oxygen.  Labs pertinent for hemoglobin of 6.8, MCV 106, BUN 35, creatinine 1.43, albumin 2.5, GFR 54, BNP 267, COVID, flu and RSV negative, troponin 25>30, LA 1.0. Chest x-ray was negative for any acute abnormality. CTA chest was negative for PE but did show small right and tiny left effusions with consolidative opacity along the right lower lobe, possible pneumonia.  Also noted to have separate areas of patchy groundglass and interstitial septal thickening which can be due to component of her edema. CT abdomen and pelvis was motion degraded but only shows large amount of colonic stool.   EKG.  Personally reviewed.  Normal sinus rhythm, right axis and T wave inversion in lead III and aVF.  No other significant abnormality.   Patient was started on Zithromax and ceftriaxone in ED.  Also ordered 1 unit of PRBC.  6/1: Hemodynamically stable.  Procalcitonin negative.  Hemoglobin improved to 7.6 after 1 unit of PRBC.  GI planning EGD and colonoscopy tomorrow.  Pulmonary was also consulted for significant right lower lobe opacity/infiltrate.  Might need further diagnostic  procedure.  6/2: Remains stable.  Initially anesthesia refused due to incomplete cardiac workup and the patient with no prior cardiac history.  Cardiology was consulted for clearance and urgent echo was done by cardiology which was essentially normal.  Now going for EGD and colonoscopy later today.  No upper respiratory symptoms.  Respiratory viral panel negative.  Lipid panel normal with LDL of 22 and HDL of 24.  Assessment and Plan: * Acute hypoxic respiratory failure (HCC) Patient was hypoxic up to 87% on room air, requiring up to 2 L of oxygen with no prior oxygen use.  No history of smoking or COPD.  Currently on room air at rest Concern of right lower lobe infiltrate and some surrounding opacities on CT chest.  CTA negative for PE, procalcitonin negative.  RVP negative.  Pending CRP -Consulted pulmonology -Continue with supportive care  CAP (community acquired pneumonia) CT chest concerning for a large right lower lobe infiltrate.  Patient to have some cough for the past few days.  No fever or chills.  No leukocytosis.  COVID, influenza and RSV negative.  CRP and respiratory viral panel was ordered by pulmonary.  Procalcitonin negative. Pulmonary was consulted as he might need more diagnostic procedure -Continue ceftriaxone and Zithromax-will complete a 5-day course -Check strep pneumo and Legionella antigen-pending -Patient will need a repeat CT chest in 4 to 6 weeks to see the resolution of these findings.  Anemia Patient with worsening fatigue for the past few week.  Saw PCP  and found to have hemoglobin of 8.2 on 5/16.  No obvious bleeding but do use ibuprofen on a daily basis.  Hemoglobin at 8.1 today, s/p 1 unit of PRBC anemia panel with some iron deficiency and anemia of chronic disease.  Recent check of B12 at PCP office on 5/16 at 236. Patient also has macrocytosis.  Folate looks normal. Also has an history of unintentional weight loss over the past few month. -GI was  consulted-going for EGD and colonoscopy today -Avoid NSAIDs -IV protonix  Elevated troponin Intermittent chest pain. Patient has some intermittent atypical chest pain, there is some exertional component, nonradiating and no associated factors. Troponin barely positive with a flat curve. CT chest with concerning of bilateral small pleural effusions and groundglass opacities which can be due to pulmonary edema. BNP at 267.  No peripheral edema.  No prior cardiac history -Echocardiogram-done urgently by cardiology and it was negative for any significant abnormality.  Cardiology cleared him for anesthesia -Monitor volume status  AKI (acute kidney injury) (HCC) Creatinine at 1.43>>1.31>>1.41 with baseline appears to be around 1.2.  Mild AKI with decrease of GFR to 55.  Did receive contrast with CTA -Gentle IV fluid -Renal ultrasound-with mild bilateral renal atrophy.  No collecting system dilatation -Monitor renal function -Avoid nephrotoxins -Holding home HCTZ and irbesartan  Essential hypertension Blood pressure mildly elevated. Patient uses olmesartan and HCTZ at home. -Holding home antihypertensives due to AKI -As needed hydralazine  Weakness Most likely secondary to worsening anemia. -PT evaluation  Hypercholesteremia -Continue home statin  Type 2 diabetes mellitus (HCC) Patient was on metformin and Amaryl at home.  Recent A1c of 5.7 -SSI   Subjective: Patient with no new complaints.  He was initially quite upset that procedure is being postponed.  Wants to go home as soon as possible  Physical Exam: Vitals:   02/12/23 1418 02/12/23 1845 02/13/23 0105 02/13/23 0833  BP:  (!) 144/86 137/73 (!) 166/88  Pulse:  78 76 85  Resp:  15 18 18   Temp:  98.2 F (36.8 C) (!) 97.3 F (36.3 C) 97.6 F (36.4 C)  TempSrc:      SpO2:  93% 97% 93%  Weight: 77.6 kg     Height:       General.  Well-developed gentleman, in no acute distress. Pulmonary.  Lungs clear bilaterally,  normal respiratory effort. CV.  Regular rate and rhythm, no JVD, rub or murmur. Abdomen.  Soft, nontender, nondistended, BS positive. CNS.  Alert and oriented .  No focal neurologic deficit. Extremities.  No edema, no cyanosis, pulses intact and symmetrical. Psychiatry.  Judgment and insight appears normal.   Data Reviewed: Prior data reviewed  Family Communication: Discussed with wife at bedside  Disposition: Status is: Inpatient Remains inpatient appropriate because: Severity of illness  Planned Discharge Destination: Home  Time spent: 46 minutes  This record has been created using Conservation officer, historic buildings. Errors have been sought and corrected,but may not always be located. Such creation errors do not reflect on the standard of care.   Author: Arnetha Courser, MD 02/13/2023 2:31 PM  For on call review [email or web address].

## 2023-02-13 NOTE — Assessment & Plan Note (Signed)
CT chest concerning for a large right lower lobe infiltrate.  Patient to have some cough for the past few days.  No fever or chills.  No leukocytosis.  COVID, influenza and RSV negative.  CRP and respiratory viral panel was ordered by pulmonary.  Procalcitonin negative. Pulmonary was consulted as he might need more diagnostic procedure -Continue ceftriaxone and Zithromax-will complete a 5-day course -Check strep pneumo and Legionella antigen-pending -Patient will need a repeat CT chest in 4 to 6 weeks to see the resolution of these findings.

## 2023-02-13 NOTE — Progress Notes (Signed)
[facility] Gastroenterology Inpatient Progress Note    Subjective: Patient seen for pre-op visit for EGD and colonoscopy today. Due to increased troponin and dyspnea, anesthesia service did not feel comfortable proceeding with sedating due to concerns of no echo being performed. Patient denies recurrent bleeding and would like to have the procedures today. Dr Duke Salvia of Cardiology was consulted and kindly saw patient and had urgent echo performed (was not routinely offered by hospital  over the weekend) which showed no significant abnormalities. Patient is clear  Objective: Vital signs in last 24 hours: Temp:  [97.3 F (36.3 C)-98.2 F (36.8 C)] 97.6 F (36.4 C) (06/02 0833) Pulse Rate:  [76-85] 85 (06/02 0833) Resp:  [15-18] 18 (06/02 0833) BP: (137-166)/(73-88) 166/88 (06/02 0833) SpO2:  [93 %-97 %] 93 % (06/02 0833) Weight:  [77.6 kg] 77.6 kg (06/01 1418) Blood pressure (!) 166/88, pulse 85, temperature 97.6 F (36.4 C), resp. rate 18, height 5\' 8"  (1.727 m), weight 77.6 kg, SpO2 93 %.    Intake/Output from previous day: 06/01 0701 - 06/02 0700 In: 535.8 [I.V.:185.8; IV Piggyback:350] Out: 950 [Urine:950]  Intake/Output this shift: No intake/output data recorded.   Gen: NAD. Appears comfortable.  HEENT: [place]/AT. PERRLA. Normal external ear exam.  Chest: CTA, no wheezes.  CV: RR nl S1, S2. No gallops.  Abd: soft, nt, nd. BS+  Ext: no edema. Pulses 2+  Neuro: Alert and oriented. Judgement appears normal. Nonfocal.   Lab Results: Results for orders placed or performed during the hospital encounter of 02/11/23 (from the past 24 hour(s))  Glucose, capillary     Status: Abnormal   Collection Time: 02/12/23  1:33 PM  Result Value Ref Range   Glucose-Capillary 109 (H) 70 - 99 mg/dL  Respiratory (~16 pathogens) panel by PCR     Status: None   Collection Time: 02/12/23  4:19 PM   Specimen: Nasopharyngeal Swab; Respiratory  Result Value Ref Range   Adenovirus NOT  DETECTED NOT DETECTED   Coronavirus 229E NOT DETECTED NOT DETECTED   Coronavirus HKU1 NOT DETECTED NOT DETECTED   Coronavirus NL63 NOT DETECTED NOT DETECTED   Coronavirus OC43 NOT DETECTED NOT DETECTED   Metapneumovirus NOT DETECTED NOT DETECTED   Rhinovirus / Enterovirus NOT DETECTED NOT DETECTED   Influenza A NOT DETECTED NOT DETECTED   Influenza B NOT DETECTED NOT DETECTED   Parainfluenza Virus 1 NOT DETECTED NOT DETECTED   Parainfluenza Virus 2 NOT DETECTED NOT DETECTED   Parainfluenza Virus 3 NOT DETECTED NOT DETECTED   Parainfluenza Virus 4 NOT DETECTED NOT DETECTED   Respiratory Syncytial Virus NOT DETECTED NOT DETECTED   Bordetella pertussis NOT DETECTED NOT DETECTED   Bordetella Parapertussis NOT DETECTED NOT DETECTED   Chlamydophila pneumoniae NOT DETECTED NOT DETECTED   Mycoplasma pneumoniae NOT DETECTED NOT DETECTED  Glucose, capillary     Status: Abnormal   Collection Time: 02/12/23  4:41 PM  Result Value Ref Range   Glucose-Capillary 135 (H) 70 - 99 mg/dL  Glucose, capillary     Status: Abnormal   Collection Time: 02/12/23  9:03 PM  Result Value Ref Range   Glucose-Capillary 162 (H) 70 - 99 mg/dL  CBC     Status: Abnormal   Collection Time: 02/13/23  3:29 AM  Result Value Ref Range   WBC 5.2 4.0 - 10.5 K/uL   RBC 2.44 (L) 4.22 - 5.81 MIL/uL   Hemoglobin 8.1 (L) 13.0 - 17.0 g/dL   HCT 10.9 (L) 60.4 - 54.0 %  MCV 98.0 80.0 - 100.0 fL   MCH 33.2 26.0 - 34.0 pg   MCHC 33.9 30.0 - 36.0 g/dL   RDW 16.1 (H) 09.6 - 04.5 %   Platelets 177 150 - 400 K/uL   nRBC 0.0 0.0 - 0.2 %  Basic metabolic panel     Status: Abnormal   Collection Time: 02/13/23  3:29 AM  Result Value Ref Range   Sodium 134 (L) 135 - 145 mmol/L   Potassium 4.5 3.5 - 5.1 mmol/L   Chloride 100 98 - 111 mmol/L   CO2 25 22 - 32 mmol/L   Glucose, Bld 90 70 - 99 mg/dL   BUN 33 (H) 8 - 23 mg/dL   Creatinine, Ser 4.09 (H) 0.61 - 1.24 mg/dL   Calcium 8.7 (L) 8.9 - 10.3 mg/dL   GFR, Estimated 55 (L) >60  mL/min   Anion gap 9 5 - 15  Lipid panel     Status: Abnormal   Collection Time: 02/13/23  3:29 AM  Result Value Ref Range   Cholesterol 61 0 - 200 mg/dL   Triglycerides 74 <811 mg/dL   HDL 24 (L) >91 mg/dL   Total CHOL/HDL Ratio 2.5 RATIO   VLDL 15 0 - 40 mg/dL   LDL Cholesterol 22 0 - 99 mg/dL  Glucose, capillary     Status: Abnormal   Collection Time: 02/13/23  8:37 AM  Result Value Ref Range   Glucose-Capillary 122 (H) 70 - 99 mg/dL  Glucose, capillary     Status: None   Collection Time: 02/13/23 11:49 AM  Result Value Ref Range   Glucose-Capillary 94 70 - 99 mg/dL     Recent Labs    47/82/95 0905 02/12/23 0326 02/13/23 0329  WBC 5.8 6.0 5.2  HGB 6.8* 7.6* 8.1*  HCT 21.2* 23.2* 23.9*  PLT 165 157 177   BMET Recent Labs    02/11/23 0905 02/12/23 0326 02/13/23 0329  NA 133* 135 134*  K 4.6 4.4 4.5  CL 104 104 100  CO2 22 25 25   GLUCOSE 181* 71 90  BUN 35* 34* 33*  CREATININE 1.43* 1.31* 1.41*  CALCIUM 8.2* 8.6* 8.7*   LFT Recent Labs    02/11/23 0905  PROT 9.5*  ALBUMIN 2.5*  AST 22  ALT 23  ALKPHOS 70  BILITOT 0.5   PT/INR Recent Labs    02/11/23 0906  LABPROT 16.1*  INR 1.3*   Hepatitis Panel No results for input(s): "HEPBSAG", "HCVAB", "HEPAIGM", "HEPBIGM" in the last 72 hours. C-Diff No results for input(s): "CDIFFTOX" in the last 72 hours. No results for input(s): "CDIFFPCR" in the last 72 hours.   Studies/Results: ECHOCARDIOGRAM COMPLETE  Result Date: 02/13/2023    ECHOCARDIOGRAM REPORT   Patient Name:   Victor Phillips Boman Date of Exam: 02/13/2023 Medical Rec #:  [id number]        Height:       68.0 in Accession #:    [id number]       Weight:       171.0 lb Date of Birth:  November 10, 1955       BSA:          1.912 m Patient Age:    66 years         BP:           166/88 mmHg Patient Gender: M                HR:  86 bpm. Exam Location:  [facility] Procedure: 2D Echo, Cardiac Doppler, Color Doppler and Strain Analysis Indications:    Chest Pain  R07.9  History:        Patient has no prior history of Echocardiogram examinations.                 Signs/Symptoms:Chest Pain; Risk Factors:Hypertension, Diabetes,                 Dyslipidemia and Non-Smoker.  Sonographer:    Dondra Prader RVT RCS Referring Phys: [id number] CADENCE H FURTH IMPRESSIONS  1. Left ventricular ejection fraction, by estimation, is 60 to 65%. The left ventricle has normal function. The left ventricle has no regional wall motion abnormalities. Left ventricular diastolic parameters are consistent with Grade II diastolic dysfunction (pseudonormalization). Elevated left ventricular end-diastolic pressure. The average left ventricular global longitudinal strain is -19.2 %. The global longitudinal strain is normal.  2. Right ventricular systolic function is normal. The right ventricular size is normal. There is normal pulmonary artery systolic pressure.  3. The mitral valve is normal in structure. Trivial mitral valve regurgitation. No evidence of mitral stenosis.  4. The aortic valve is tricuspid. Aortic valve regurgitation is not visualized. No aortic stenosis is present.  5. There is dilatation of the ascending aorta, measuring 38 mm.  6. The inferior vena cava is dilated in size with >50% respiratory variability, suggesting right atrial pressure of 8 mmHg. FINDINGS  Left Ventricle: Left ventricular ejection fraction, by estimation, is 60 to 65%. The left ventricle has normal function. The left ventricle has no regional wall motion abnormalities. The average left ventricular global longitudinal strain is -19.2 %. The global longitudinal strain is normal. The left ventricular internal cavity size was normal in size. There is no left ventricular hypertrophy. Left ventricular diastolic parameters are consistent with Grade II diastolic dysfunction (pseudonormalization). Elevated left ventricular end-diastolic pressure. Right Ventricle: The right ventricular size is normal. No increase in right  ventricular wall thickness. Right ventricular systolic function is normal. There is normal pulmonary artery systolic pressure. The tricuspid regurgitant velocity is 2.23 m/s, and  with an assumed right atrial pressure of 8 mmHg, the estimated right ventricular systolic pressure is 27.9 mmHg. Left Atrium: Left atrial size was normal in size. Right Atrium: Right atrial size was normal in size. Pericardium: There is no evidence of pericardial effusion. Mitral Valve: The mitral valve is normal in structure. Trivial mitral valve regurgitation. No evidence of mitral valve stenosis. Tricuspid Valve: The tricuspid valve is normal in structure. Tricuspid valve regurgitation is trivial. No evidence of tricuspid stenosis. Aortic Valve: The aortic valve is tricuspid. Aortic valve regurgitation is not visualized. No aortic stenosis is present. Aortic valve mean gradient measures 5.0 mmHg. Aortic valve peak gradient measures 9.6 mmHg. Aortic valve area, by VTI measures 2.58 cm. Pulmonic Valve: The pulmonic valve was normal in structure. Pulmonic valve regurgitation is trivial. No evidence of pulmonic stenosis. Aorta: The aortic root is normal in size and structure. There is dilatation of the ascending aorta, measuring 38 mm. Venous: The inferior vena cava is dilated in size with greater than 50% respiratory variability, suggesting right atrial pressure of 8 mmHg. IAS/Shunts: No atrial level shunt detected by color flow Doppler.  LEFT VENTRICLE PLAX 2D LVIDd:         5.10 cm   Diastology LVIDs:         3.40 cm   LV e' medial:    6.12 cm/s LV PW:  1.10 cm   LV E/e' medial:  19.0 LV IVS:        1.00 cm   LV e' lateral:   9.28 cm/s LVOT diam:     2.10 cm   LV E/e' lateral: 12.5 LV SV:         84 LV SV Index:   44        2D Longitudinal Strain LVOT Area:     3.46 cm  2D Strain GLS (A2C):   -16.9 %                          2D Strain GLS (A3C):   -18.2 %                          2D Strain GLS (A4C):   -22.6 %                           2D Strain GLS Avg:     -19.2 % RIGHT VENTRICLE RV Basal diam:  3.30 cm RV S prime:     10.80 cm/s TAPSE (M-mode): 2.0 cm LEFT ATRIUM             Index        RIGHT ATRIUM           Index LA diam:        4.20 cm 2.20 cm/m   RA Area:     12.80 cm LA Vol (A2C):   47.8 ml 25.00 ml/m  RA Volume:   26.60 ml  13.91 ml/m LA Vol (A4C):   40.2 ml 21.05 ml/m LA Biplane Vol: 54.0 ml 28.24 ml/m  AORTIC VALVE                     PULMONIC VALVE AV Area (Vmax):    2.64 cm      PV Vmax:          1.41 m/s AV Area (Vmean):   2.58 cm      PV Peak grad:     8.0 mmHg AV Area (VTI):     2.58 cm      PR End Diast Vel: 5.11 msec AV Vmax:           155.00 cm/s AV Vmean:          107.000 cm/s AV VTI:            0.326 m AV Peak Grad:      9.6 mmHg AV Mean Grad:      5.0 mmHg LVOT Vmax:         118.00 cm/s LVOT Vmean:        79.700 cm/s LVOT VTI:          0.243 m LVOT/AV VTI ratio: 0.75  AORTA Ao Root diam: 3.20 cm Ao Asc diam:  3.80 cm MITRAL VALVE                TRICUSPID VALVE MV Area (PHT): 3.85 cm     TR Peak grad:   19.9 mmHg MV Decel Time: 197 msec     TR Vmax:        223.00 cm/s MV E velocity: 116.00 cm/s MV A velocity: 71.10 cm/s   SHUNTS MV E/A ratio:  1.63         Systemic VTI:  0.24 m  Systemic Diam: 2.10 cm Chilton Si MD Electronically signed by Chilton Si MD Signature Date/Time: 02/13/2023/10:42:45 AM    Final    US RENAL  Result Date: 02/11/2023 CLINICAL DATA:  Acute kidney insufficiency EXAM: RENAL / URINARY TRACT ULTRASOUND COMPLETE COMPARISON:  CT earlier 02/11/2019 FINDINGS: Right Kidney: Renal measurements: 11.7 x 6.8 x 6.3 cm = volume: 262.4 mL. Mild parenchymal atrophy prominent renal sinus fat. No perinephric fluid. Left Kidney: Renal measurements: 12.0 x 7.1 x 5.6 cm = volume: 250 mL. Mild parenchymal atrophy with prominent renal sinus fat. No collecting system dilatation or perinephric fluid. Bladder: Distended with fluid.  Preserved contour.  Ureteral jets are  seen. Other: None. IMPRESSION: Mild bilateral renal atrophy. No collecting system dilatation. Please correlate with separate contrast CT from same day Electronically Signed   By: Karen Kays M.D.   On: 02/11/2023 15:20    Scheduled Inpatient Medications:    atorvastatin  10 mg Oral Daily   Fe Fum-Vit C-Vit B12-FA  1 capsule Oral QPC breakfast   furosemide  60 mg Intravenous Daily   insulin aspart  0-15 Units Subcutaneous TID WC   insulin aspart  0-5 Units Subcutaneous QHS   multivitamin with minerals  1 tablet Oral Daily   pantoprazole (PROTONIX) IV  40 mg Intravenous Q12H    Continuous Inpatient Infusions:    sodium chloride 20 mL/hr at 02/12/23 1632   azithromycin Stopped (02/12/23 1216)   cefTRIAXone (ROCEPHIN)  IV 2 g (02/13/23 1125)    PRN Inpatient Medications:  acetaminophen **OR** acetaminophen, hydrALAZINE  Miscellaneous: N/A  Assessment:  Symptomatic anemia. Hemoccult positive stool. Elevated troponin, asymptomatic without chest pain. Dyspnea, mild, likely secondary to anemia, pneumonia. Sats stable. AKI - per medicine, stable.  Plan:  After a delay cardiac workup has been completed so we will re-post procedures for today. Care team notified. Appreciate Dr. Duke Salvia of cardiology and Dr. Nelson Chimes of Medicine helping to expedite the care of this patient. Further recommendations per procedure documentation later today.  Brandi Tomlinson K. Norma Fredrickson, M.D. 02/13/2023, 11:52 AM

## 2023-02-13 NOTE — Assessment & Plan Note (Signed)
Patient with worsening fatigue for the past few week.  Saw PCP and found to have hemoglobin of 8.2 on 5/16.  No obvious bleeding but do use ibuprofen on a daily basis.  Hemoglobin at 8.1 today, s/p 1 unit of PRBC anemia panel with some iron deficiency and anemia of chronic disease.  Recent check of B12 at PCP office on 5/16 at 236. Patient also has macrocytosis.  Folate looks normal. Also has an history of unintentional weight loss over the past few month. -GI was consulted-going for EGD and colonoscopy today -Avoid NSAIDs -IV protonix

## 2023-02-13 NOTE — Anesthesia Preprocedure Evaluation (Signed)
Anesthesia Evaluation  Patient identified by MRN, date of birth, ID band Patient awake    Reviewed: Allergy & Precautions, NPO status , Patient's Chart, lab work & pertinent test results  History of Anesthesia Complications Negative for: history of anesthetic complications  Airway Mallampati: III  TM Distance: >3 FB Neck ROM: full    Dental no notable dental hx.    Pulmonary pneumonia   Pulmonary exam normal        Cardiovascular hypertension, On Medications negative cardio ROS Normal cardiovascular exam  IMPRESSIONS     1. Left ventricular ejection fraction, by estimation, is 60 to 65%. The  left ventricle has normal function. The left ventricle has no regional  wall motion abnormalities. Left ventricular diastolic parameters are  consistent with Grade II diastolic  dysfunction (pseudonormalization). Elevated left ventricular end-diastolic  pressure. The average left ventricular global longitudinal strain is -19.2  %. The global longitudinal strain is normal.   2. Right ventricular systolic function is normal. The right ventricular  size is normal. There is normal pulmonary artery systolic pressure.   3. The mitral valve is normal in structure. Trivial mitral valve  regurgitation. No evidence of mitral stenosis.   4. The aortic valve is tricuspid. Aortic valve regurgitation is not  visualized. No aortic stenosis is present.   5. There is dilatation of the ascending aorta, measuring 38 mm.   6. The inferior vena cava is dilated in size with >50% respiratory  variability, suggesting right atrial pressure of 8 mmHg.     Neuro/Psych negative neurological ROS  negative psych ROS   GI/Hepatic negative GI ROS, Neg liver ROS,,,  Endo/Other  negative endocrine ROSdiabetes    Renal/GU Renal disease (AKI)  negative genitourinary   Musculoskeletal   Abdominal   Peds  Hematology  (+) Blood dyscrasia, anemia    Anesthesia Other Findings Past Medical History: No date: Diabetes mellitus without complication (HCC) No date: Hypertension  History reviewed. No pertinent surgical history.  BMI    Body Mass Index: 26.00 kg/m      Reproductive/Obstetrics negative OB ROS                              Anesthesia Physical Anesthesia Plan  ASA: 3  Anesthesia Plan: General   Post-op Pain Management:    Induction: Intravenous  PONV Risk Score and Plan: Propofol infusion and TIVA  Airway Management Planned: Natural Airway and Nasal Cannula  Additional Equipment:   Intra-op Plan:   Post-operative Plan:   Informed Consent: I have reviewed the patients History and Physical, chart, labs and discussed the procedure including the risks, benefits and alternatives for the proposed anesthesia with the patient or authorized representative who has indicated his/her understanding and acceptance.     Dental Advisory Given  Plan Discussed with: Anesthesiologist, CRNA and Surgeon  Anesthesia Plan Comments: (Patient consented for risks of anesthesia including but not limited to:  - adverse reactions to medications - risk of airway placement if required - damage to eyes, teeth, lips or other oral mucosa - nerve damage due to positioning  - sore throat or hoarseness - Damage to heart, brain, nerves, lungs, other parts of body or loss of life  Patient voiced understanding.)         Anesthesia Quick Evaluation

## 2023-02-13 NOTE — Assessment & Plan Note (Signed)
Intermittent chest pain. Patient has some intermittent atypical chest pain, there is some exertional component, nonradiating and no associated factors. Troponin barely positive with a flat curve. CT chest with concerning of bilateral small pleural effusions and groundglass opacities which can be due to pulmonary edema. BNP at 267.  No peripheral edema.  No prior cardiac history -Echocardiogram-done urgently by cardiology and it was negative for any significant abnormality.  Cardiology cleared him for anesthesia -Monitor volume status

## 2023-02-13 NOTE — Assessment & Plan Note (Signed)
Patient was hypoxic up to 87% on room air, requiring up to 2 L of oxygen with no prior oxygen use.  No history of smoking or COPD.  Currently on room air at rest Concern of right lower lobe infiltrate and some surrounding opacities on CT chest.  CTA negative for PE, procalcitonin negative.  RVP negative.  Pending CRP -Consulted pulmonology -Continue with supportive care

## 2023-02-13 NOTE — Op Note (Signed)
[name] Gastroenterology Patient Name: Victor Phillips Procedure Date: 02/13/2023 2:42 PM MRN: [id number] Account #: [id number] Date of Birth: Apr 25, 1956 Admit Type: Outpatient Age: 67 Room: [facility] ENDO ROOM 4 Gender: Male Note Status: Finalized Instrument Name: Upper Endoscope [id number] Procedure:             Upper GI endoscopy Indications:           Iron deficiency anemia secondary to chronic blood                         loss, Heme positive stool Providers:             Boykin Nearing. Ladasia Sircy MD, MD Medicines:             Propofol per Anesthesia Complications:         No immediate complications. Procedure:             Pre-Anesthesia Assessment:                        - The risks and benefits of the procedure and the                         sedation options and risks were discussed with the                         patient. All questions were answered and informed                         consent was obtained.                        - Patient identification and proposed procedure were                         verified prior to the procedure by the nurse. The                         procedure was verified in the procedure room.                        - ASA Grade Assessment: III - A patient with severe                         systemic disease.                        - After reviewing the risks and benefits, the patient                         was deemed in satisfactory condition to undergo the                         procedure.                        After obtaining informed consent, the endoscope was                         passed under direct vision. Throughout the procedure,  the patient's blood pressure, pulse, and oxygen                         saturations were monitored continuously. The Endoscope                         was introduced through the mouth, and advanced to the                         third part of duodenum. The upper GI endoscopy  was                         accomplished without difficulty. The patient tolerated                         the procedure well. Findings:      The esophagus was normal.      Scattered mild inflammation characterized by congestion (edema) and       erythema was found in the gastric body and in the gastric antrum.      The examined duodenum was normal.      The exam was otherwise without abnormality. Impression:            - Normal esophagus.                        - Gastritis.                        - Normal examined duodenum.                        - The examination was otherwise normal.                        - No specimens collected. Recommendation:        - Proceed with colonoscopy Procedure Code(s):     --- Professional ---                        [phone], Esophagogastroduodenoscopy, flexible,                         transoral; diagnostic, including collection of                         specimen(s) by brushing or washing, when performed                         (separate procedure) Diagnosis Code(s):     --- Professional ---                        R19.5, Other fecal abnormalities                        D50.0, Iron deficiency anemia secondary to blood loss                         (chronic)                        K29.70, Gastritis, unspecified, without bleeding CPT  copyright 2022 American Medical Association. All rights reserved. The codes documented in this report are preliminary and upon coder review may  be revised to meet current compliance requirements. Stanton Kidney MD, MD 02/13/2023 3:34:12 PM This report has been signed electronically. Number of Addenda: 0 Note Initiated On: 02/13/2023 2:42 PM Estimated Blood Loss:  Estimated blood loss: none.      [facility]

## 2023-02-13 NOTE — Anesthesia Postprocedure Evaluation (Signed)
Anesthesia Post Note  Patient: Victor Phillips  Procedure(s) Performed: ESOPHAGOGASTRODUODENOSCOPY (EGD) COLONOSCOPY  Patient location during evaluation: PACU Anesthesia Type: General Level of consciousness: awake and alert Pain management: pain level controlled Vital Signs Assessment: post-procedure vital signs reviewed and stable Respiratory status: spontaneous breathing, nonlabored ventilation, respiratory function stable and patient connected to nasal cannula oxygen Cardiovascular status: blood pressure returned to baseline and stable Postop Assessment: no apparent nausea or vomiting Anesthetic complications: no   No notable events documented.   Last Vitals:  Vitals:   02/13/23 1615 02/13/23 1641  BP: 124/77 136/82  Pulse: 67 75  Resp: 20 18  Temp:  36.4 C  SpO2: 96% 97%    Last Pain:  Vitals:   02/13/23 1615  TempSrc:   PainSc: 0-No pain                 Louie Boston

## 2023-02-13 NOTE — TOC CM/SW Note (Signed)
PT recommends HH. Patient currently off the floor. TOC will follow up later.  Alfonso Ramus, [place] [phone]

## 2023-02-13 NOTE — Assessment & Plan Note (Signed)
Patient was on metformin and Amaryl at home.  Recent A1c of 5.7 -SSI

## 2023-02-13 NOTE — Evaluation (Signed)
Physical Therapy Evaluation Patient Details Name: Victor Phillips MRN: [id number] DOB: 31-Jan-1956 Today's Date: 02/13/2023  History of Present Illness  Victor Phillips is a 66yoM who comes [facility] 02/[facility]23 c CP and fatigue. Pt admitted with CAP, also found to be anemic. PMH: DM2, HTN.  Clinical Impression  Pt in bed on entry, wife at bedside. Pt reports feel much improved since admission, however he is still NPO awaiting procedures. He moves to EOB then up to standing with good power production, but immediately his balance is off which is new and concerning to him. He reports giddiness upon standing that only mildly improves while up for 2-3 minutes. Pt eventually willing to attempt some marching in place, but his balance is not great. We ultimately decide to defer any additional mobility to another visit once he has resumed a diet. Pt is happy to sit at EOB at end of session. Will continue to follow.      Recommendations for follow up therapy are one component of a multi-disciplinary discharge planning process, led by the attending physician.  Recommendations may be updated based on patient status, additional functional criteria and insurance authorization.  Follow Up Recommendations       Assistance Recommended at Discharge Set up Supervision/Assistance  Patient can return home with the following  A little help with walking and/or transfers;A little help with bathing/dressing/bathroom;Assist for transportation    Equipment Recommendations None recommended by PT  Recommendations for Other Services       Functional Status Assessment Patient has had a recent decline in their functional status and demonstrates the ability to make significant improvements in function in a reasonable and predictable amount of time.     Precautions / Restrictions Precautions Precautions: Fall Restrictions Weight Bearing Restrictions: No      Mobility  Bed Mobility Overal bed mobility: Modified  Independent                  Transfers Overall transfer level: Modified independent Equipment used: None               General transfer comment: immediately off balance, giddy, does not improve much over several minutes; able to attempt some cautious marching in place.    Ambulation/Gait                  Stairs            Wheelchair Mobility    Modified Rankin (Stroke Patients Only)       Balance                                             Pertinent Vitals/Pain Pain Assessment Pain Assessment: No/denies pain    Home Living Family/patient expects to be discharged to:: Private residence Living Arrangements: Spouse/significant other Available Help at Discharge: Family Type of Home: House           Home Equipment: Agricultural consultant (2 wheels)      Prior Function                       Hand Dominance        Extremity/Trunk Assessment                Communication      Cognition Arousal/Alertness: Awake/alert Behavior During Therapy: WFL for tasks assessed/performed Overall Cognitive Status:  Within Functional Limits for tasks assessed                                          General Comments      Exercises     Assessment/Plan    PT Assessment Patient needs continued PT services  PT Problem List Decreased strength;Decreased activity tolerance;Decreased mobility       PT Treatment Interventions DME instruction;Gait training;Functional mobility training;Therapeutic activities;Therapeutic exercise;Balance training;Patient/family education    PT Goals (Current goals can be found in the Care Plan section)  Acute Rehab PT Goals Patient Stated Goal: not be dizzy while up PT Goal Formulation: With patient Time For Goal Achievement: 02/27/23 Potential to Achieve Goals: Good    Frequency Min 3X/week     Co-evaluation               AM-PAC PT "6 Clicks" Mobility  Outcome  Measure Help needed turning from your back to your side while in a flat bed without using bedrails?: None Help needed moving from lying on your back to sitting on the side of a flat bed without using bedrails?: None Help needed moving to and from a bed to a chair (including a wheelchair)?: A Little   Help needed to walk in hospital room?: A Lot Help needed climbing 3-5 steps with a railing? : A Lot 6 Click Score: 15    End of Session Equipment Utilized During Treatment: Gait belt Activity Tolerance: Treatment limited secondary to medical complications (Comment) Patient left: in bed;with family/visitor present;with call bell/phone within reach Nurse Communication: Mobility status PT Visit Diagnosis: Difficulty in walking, not elsewhere classified (R26.2);Unsteadiness on feet (R26.81);Other abnormalities of gait and mobility (R26.89);Dizziness and giddiness (R42)    Time: 1610-9604 PT Time Calculation (min) (ACUTE ONLY): 13 min   Charges:   PT Evaluation $PT Eval Moderate Complexity: 1 Mod         11:13 AM, 02/13/23 Rosamaria Lints, PT, DPT Physical Therapist - [facility]  [phone] (ASCOM)    Waino Mounsey C 02/13/2023, 11:08 AM

## 2023-02-13 NOTE — Progress Notes (Signed)
SLP Cancellation Note  Patient Details Name: Victor Phillips MRN: [id number] DOB: 03-11-1956   Cancelled treatment:       Reason Eval/Treat Not Completed: Medical issues which prohibited therapy (Pt NPO for possible EGD. Will continue efforts as appropriate.)  Clyde Canterbury, M.S., CCC-SLP Speech-Language Pathologist [facility] [phone] [name])  [name] 02/13/2023, 8:03 AM

## 2023-02-13 NOTE — Assessment & Plan Note (Signed)
Creatinine at 1.43>>1.31>>1.41 with baseline appears to be around 1.2.  Mild AKI with decrease of GFR to 55.  Did receive contrast with CTA -Gentle IV fluid -Renal ultrasound-with mild bilateral renal atrophy.  No collecting system dilatation -Monitor renal function -Avoid nephrotoxins -Holding home HCTZ and irbesartan

## 2023-02-13 NOTE — Transfer of Care (Signed)
Immediate Anesthesia Transfer of Care Note  Patient: Victor Phillips  Procedure(s) Performed: ESOPHAGOGASTRODUODENOSCOPY (EGD) COLONOSCOPY  Patient Location: PACU  Anesthesia Type:General  Level of Consciousness: awake, alert , and oriented  Airway & Oxygen Therapy: Patient Spontanous Breathing  Post-op Assessment: Report given to RN and Post -op Vital signs reviewed and stable  Post vital signs: Reviewed and stable  Last Vitals:  Vitals Value Taken Time  BP 95/66 1543 02/13/2023  Temp    Pulse 66   Resp 12   SpO2 98     Last Pain:  Vitals:   02/13/23 1433  TempSrc: Temporal  PainSc:          Complications: No notable events documented.

## 2023-02-13 NOTE — Op Note (Signed)
[facility] Gastroenterology Patient Name: Victor Phillips Procedure Date: 02/13/2023 2:42 PM MRN: [id number] Account #: [id number] Date of Birth: 1955/10/24 Admit Type: Outpatient Age: 67 Room: [facility] ENDO ROOM 4 Gender: Male Note Status: Finalized Instrument Name: Colonoscope [id number] Procedure:             Colonoscopy Indications:           Heme positive stool, Iron deficiency anemia secondary                         to chronic blood loss Providers:             Boykin Nearing. Karion Cudd MD, MD Medicines:             Propofol per Anesthesia Complications:         No immediate complications. Procedure:             Pre-Anesthesia Assessment:                        - The risks and benefits of the procedure and the                         sedation options and risks were discussed with the                         patient. All questions were answered and informed                         consent was obtained.                        - Patient identification and proposed procedure were                         verified prior to the procedure by the nurse. The                         procedure was verified in the procedure room.                        - ASA Grade Assessment: III - A patient with severe                         systemic disease.                        - After reviewing the risks and benefits, the patient                         was deemed in satisfactory condition to undergo the                         procedure.                        After obtaining informed consent, the colonoscope was                         passed under direct vision. Throughout the procedure,  the patient's blood pressure, pulse, and oxygen                         saturations were monitored continuously. The                         Colonoscope was introduced through the anus with the                         intention of advancing to the cecum. The scope was                          advanced to the sigmoid colon before the procedure was                         aborted. Medications were given. The colonoscopy was                         aborted due to inadequate bowel prep. The colonoscopy                         was unusually difficult due to poor bowel prep. The                         patient tolerated the procedure well. The quality of                         the bowel preparation was poor. The colonoscopy was                         aborted. Findings:      The perianal and digital rectal examinations were normal. Pertinent       negatives include normal sphincter tone.      Copious quantities of semi-solid stool was found in the rectum and in       the distal sigmoid colon, precluding visualization. Due to *** , the       procedure was aborted at this point and the scope was withdrawn.       Estimated blood loss: none.      No evidence of blood was noted, only stool. I could not rule out polyps       or mass lesions in the colon. Impression:            - The procedure was aborted due to inadequate bowel                         prep.                        - Preparation of the colon was poor.                        - The procedure was aborted.                        - Stool in the rectum and in the distal sigmoid colon.                        -  No specimens collected. Recommendation:        - Repeat colonoscopy in 1 day because the bowel                         preparation was suboptimal. Procedure Code(s):     --- Professional ---                        [phone], 53, Colonoscopy, flexible; diagnostic,                         including collection of specimen(s) by brushing or                         washing, when performed (separate procedure) Diagnosis Code(s):     --- Professional ---                        D50.0, Iron deficiency anemia secondary to blood loss                         (chronic)                        R19.5, Other fecal abnormalities CPT  copyright 2022 American Medical Association. All rights reserved. The codes documented in this report are preliminary and upon coder review may  be revised to meet current compliance requirements. Stanton Kidney MD, MD 02/13/2023 3:48:45 PM This report has been signed electronically. Number of Addenda: 0 Note Initiated On: 02/13/2023 2:42 PM Total Procedure Duration: 0 hours 1 minute 28 seconds  Estimated Blood Loss:  Estimated blood loss: none.      [facility]

## 2023-02-13 NOTE — Consult Note (Signed)
Cardiology Consultation   Patient ID: Victor Phillips MRN: [id number]; DOB: [birth year]  Admit date: 02/11/2023 Date of Consult: 02/13/2023  PCP:  Danella Penton, MD   [facility] Providers Cardiologist:  New   Patient Profile:   Victor Phillips is a 68 y.o. male with a hx of hypertension, hyperlipidemia, diabetes who is being seen 02/13/2023 for the evaluation of preoperative cardiac evaluation at the request of Dr. Nelson Chimes.  History of Present Illness:   Victor Phillips has not been seen by cardiology in the past. No tobacco or drug history. Remote alcohol use. He works full-time but does not formal activity. NO history of chest pain, SOB, LLE, orthopnea or pnd.   The patient ended to the ER on 02/11/2023 with weakness and chest pain.  Patient reported generalized weakness, mild weight loss and abdominal pain over the last few weeks.  He was seen by his PCP, who told him he was anemic.  Hemoglobin was noted to drop t\o 8.2 and CT was scheduled as an outpatient, but no one called to schedule him.  Patient denied any blood in his stools.  No nausea, vomiting, diarrhea.  Fever or chills.  In the ER blood pressure 156/76, pulse rate 94, respiratory rate 17, afebrile, 97% O2.  Labs showed hemoglobin of 6.8.  Other labs showed sodium 133, serum creatinine 1.43, BUN 35, albumin 2.5.  High-sensitivity troponin at 45.  Chest x-ray nonacute.  CT of the chest, abdomen and pelvis showed no PE, possible pneumonia, possible edema.  Patient was given 1 unit PRBC, started on antibiotics and admitted for further workup.  Care was consulted, who plans for EGD colonoscopy tomorrow.  Echo was ordered, however this was not done over the weekend.  Cardiology was asked to see.  PTA Aspirin 325mg  daily, Lipitor 10mg  daily and Olmesartan 20-12.5mg  daily.   Past Medical History:  Diagnosis Date   Diabetes mellitus without complication (HCC)    Hypertension     History reviewed. No pertinent surgical  history.   Home Medications:  Prior to Admission medications   Medication Sig Start Date End Date Taking? Authorizing Provider  aspirin 325 MG tablet Take 325 mg by mouth daily.   Yes [provider]  atorvastatin (LIPITOR) 10 MG tablet Take 1 tablet by mouth daily. 11/23/18  Yes [provider]  ferrous gluconate (FERGON) 324 MG tablet Take 324 mg by mouth daily with breakfast. 02/03/23 02/03/24 Yes [provider]  glimepiride (AMARYL) 2 MG tablet Take 2 mg by mouth daily with breakfast. 02/03/23 02/03/24 Yes [provider]  ibuprofen (ADVIL) 800 MG tablet Take 800 mg by mouth. Take 1 tablet (800 mg total) by mouth daily with breakfast 11/23/18 02/03/24 Yes [provider]  metFORMIN (GLUCOPHAGE) 500 MG tablet Take 1,000 mg by mouth. Take 2 tablets (1,000 mg total) by mouth every evening 07/29/22 07/29/23 Yes [provider]  olmesartan-hydrochlorothiazide (BENICAR HCT) 20-12.5 MG tablet Take 1 tablet by mouth daily. 02/03/23 02/03/24 Yes [provider]  pantoprazole (PROTONIX) 40 MG tablet Take 40 mg by mouth daily. 02/03/23 02/03/24 Yes [provider]    Inpatient Medications: Scheduled Meds:  atorvastatin  10 mg Oral Daily   Fe Fum-Vit C-Vit B12-FA  1 capsule Oral QPC breakfast   furosemide  60 mg Intravenous Daily   insulin aspart  0-15 Units Subcutaneous TID WC   insulin aspart  0-5 Units Subcutaneous QHS   multivitamin with minerals  1 tablet Oral Daily  pantoprazole (PROTONIX) IV  40 mg Intravenous Q12H   Continuous Infusions:  sodium chloride 20 mL/hr at 02/12/23 1632   azithromycin Stopped (02/12/23 1216)   cefTRIAXone (ROCEPHIN)  IV Stopped (02/12/23 1401)   PRN Meds: acetaminophen **OR** acetaminophen, hydrALAZINE  Allergies:   No Known Allergies  Social History:   Social History   Socioeconomic History   Marital status: Married    Spouse name: Not on file   Number of children: Not on file   Years  of education: Not on file   Highest education level: Not on file  Occupational History   Not on file  Tobacco Use   Smoking status: Never   Smokeless tobacco: Never  Substance and Sexual Activity   Alcohol use: No   Drug use: No   Sexual activity: Not on file  Other Topics Concern   Not on file  Social History Narrative   Not on file   Social Determinants of Health   Financial Resource Strain: Not on file  Food Insecurity: No Food Insecurity (02/11/2023)   Hunger Vital Sign    Worried About Running Out of Food in the Last Year: Never true    Ran Out of Food in the Last Year: Never true  Transportation Needs: No Transportation Needs (02/11/2023)   PRAPARE - Administrator, Civil Service (Medical): No    Lack of Transportation (Non-Medical): No  Physical Activity: Not on file  Stress: Not on file  Social Connections: Not on file  Intimate Partner Violence: Not At Risk (02/11/2023)   Humiliation, Afraid, Rape, and Kick questionnaire    Fear of Current or Ex-Partner: No    Emotionally Abused: No    Physically Abused: No    Sexually Abused: No    Family History:   History reviewed. No pertinent family history.   ROS:  Please see the history of present illness.   All other ROS reviewed and negative.     Physical Exam/Data:   Vitals:   02/12/23 1418 02/12/23 1845 02/13/23 0105 02/13/23 0833  BP:  (!) 144/86 137/73 (!) 166/88  Pulse:  78 76 85  Resp:  15 18 18   Temp:  98.2 F (36.8 C) (!) 97.3 F (36.3 C) 97.6 F (36.4 C)  TempSrc:      SpO2:  93% 97% 93%  Weight: 77.6 kg     Height:        Intake/Output Summary (Last 24 hours) at 02/13/2023 0924 Last data filed at 02/13/2023 0336 Gross per 24 hour  Intake 535.8 ml  Output 950 ml  Net -414.2 ml      02/12/2023    2:18 PM 01/25/2018    7:07 PM 10/27/2017    7:14 PM  Last 3 Weights  Weight (lbs) 171 lb 184 lb 178 lb  Weight (kg) 77.565 kg 83.462 kg 80.74 kg     Body mass index is 26 kg/m.   General:  Well nourished, well developed, in no acute distress HEENT: normal Neck: no JVD Vascular: No carotid bruits; Distal pulses 2+ bilaterally Cardiac:  normal S1, S2; RRR; no murmur  Lungs:  clear to auscultation bilaterally, no wheezing, rhonchi or rales  Abd: soft, nontender, no hepatomegaly  Ext: no edema Musculoskeletal:  No deformities, BUE and BLE strength normal and equal Skin: warm and dry  Neuro:  CNs 2-12 intact, no focal abnormalities noted Psych:  Normal affect   EKG:  The EKG was personally reviewed and demonstrates: Normal sinus rhythm,  92 bpm, RAD, T wave inversion lead III, minimal ST depression V3 to V6, old changes Telemetry:  Telemetry was personally reviewed and demonstrates:  N/a  Relevant CV Studies:  Echo ordered  Laboratory Data:  High Sensitivity Troponin:   Recent Labs  Lab 2023-03-08 0905 03-08-23 1102 2023-03-08 1742 Mar 08, 2023 1937  TROPONINIHS 25* 30* 27* 28*     Chemistry Recent Labs  Lab Mar 08, 2023 0905 02/12/23 0326 02/13/23 0329  NA 133* 135 134*  K 4.6 4.4 4.5  CL 104 104 100  CO2 22 25 25   GLUCOSE 181* 71 90  BUN 35* 34* 33*  CREATININE 1.43* 1.31* 1.41*  CALCIUM 8.2* 8.6* 8.7*  GFRNONAA 54* >60 55*  ANIONGAP 7 6 9     Recent Labs  Lab 03/08/23 0905  PROT 9.5*  ALBUMIN 2.5*  AST 22  ALT 23  ALKPHOS 70  BILITOT 0.5   Lipids No results for input(s): "CHOL", "TRIG", "HDL", "LABVLDL", "LDLCALC", "CHOLHDL" in the last 168 hours.  Hematology Recent Labs  Lab Mar 08, 2023 0905 03-08-2023 1102 02/12/23 0326 02/13/23 0329  WBC 5.8  --  6.0 5.2  RBC 2.00* 1.83* 2.32* 2.44*  HGB 6.8*  --  7.6* 8.1*  HCT 21.2*  --  23.2* 23.9*  MCV 106.0*  --  100.0 98.0  MCH 34.0  --  32.8 33.2  MCHC 32.1  --  32.8 33.9  RDW 14.7  --  17.0* 15.6*  PLT 165  --  157 177   Thyroid No results for input(s): "TSH", "FREET4" in the last 168 hours.  BNP Recent Labs  Lab 03-08-2023 0905  BNP 267.2*    DDimer No results for input(s): "DDIMER" in  the last 168 hours.   Radiology/Studies:  US RENAL  Result Date: March 08, 2023 CLINICAL DATA:  Acute kidney insufficiency EXAM: RENAL / URINARY TRACT ULTRASOUND COMPLETE COMPARISON:  CT earlier 03/08/19 FINDINGS: Right Kidney: Renal measurements: 11.7 x 6.8 x 6.3 cm = volume: 262.4 mL. Mild parenchymal atrophy prominent renal sinus fat. No perinephric fluid. Left Kidney: Renal measurements: 12.0 x 7.1 x 5.6 cm = volume: 250 mL. Mild parenchymal atrophy with prominent renal sinus fat. No collecting system dilatation or perinephric fluid. Bladder: Distended with fluid.  Preserved contour.  Ureteral jets are seen. Other: None. IMPRESSION: Mild bilateral renal atrophy. No collecting system dilatation. Please correlate with separate contrast CT from same day Electronically Signed   By: Karen Kays M.D.   On: 03-08-23 15:20   CT ABDOMEN PELVIS W CONTRAST  Result Date: March 08, 2023 CLINICAL DATA:  Chest pain and shortness of breath for a few weeks. Nonlocalized pain, anemia and weight loss. EXAM: CT ANGIOGRAPHY CHEST CT ABDOMEN AND PELVIS WITH CONTRAST TECHNIQUE: Multidetector CT imaging of the chest was performed using the standard protocol during bolus administration of intravenous contrast. Multiplanar CT image reconstructions and MIPs were obtained to evaluate the vascular anatomy. Multidetector CT imaging of the abdomen and pelvis was performed using the standard protocol during bolus administration of intravenous contrast. RADIATION DOSE REDUCTION: This exam was performed according to the departmental dose-optimization program which includes automated exposure control, adjustment of the mA and/or kV according to patient size and/or use of iterative reconstruction technique. CONTRAST:  80mL OMNIPAQUE IOHEXOL 350 MG/ML SOLN COMPARISON:  Chest x-ray earlier Mar 08, 2023 and older FINDINGS: CTA CHEST FINDINGS Cardiovascular: No segmental or larger pulmonary embolism identified. Heart is mildly enlarged. Small  pericardial effusion. The thoracic aorta has a normal course and caliber with mild atherosclerotic calcified plaque. Mediastinum/Nodes: No  specific abnormal lymph node enlargement identified in the axillary regions. There are several prominent small mediastinal and hilar lymph nodes. More numerous than usually seen but predominantly less than 1 cm in size in short axis and not pathologic by size criteria. Small hiatal hernia. Preserved thyroid gland. Lungs/Pleura: Small right and tiny left pleural effusion. Diffuse patchy ground-glass opacity seen in the lungs with some areas of interstitial thickening. Please correlate for a component of edema. There is a consolidative opacity as well in the right lower lobe. Possible separate infiltrate or pneumonia. Associated areas of bronchial wall thickening bilaterally. Greatest in the right lower lobe. Musculoskeletal: Mild degenerative changes along the spine. Review of the MIP images confirms the above findings. CT ABDOMEN and PELVIS FINDINGS Hepatobiliary: No focal liver abnormality is seen. No gallstones, gallbladder wall thickening, or biliary dilatation. Pancreas: Unremarkable. No pancreatic ductal dilatation or surrounding inflammatory changes. Spleen: Normal in size without focal abnormality. Adrenals/Urinary Tract: Adrenal glands are unremarkable. Kidneys are normal, without renal calculi, focal lesion, or hydronephrosis. Bladder is unremarkable. Stomach/Bowel: Large amount of colonic stool. The bowel is nondilated. No oral contrast. Vascular/Lymphatic: Aortic atherosclerosis. No enlarged abdominal or pelvic lymph nodes. Reproductive: Prostate is unremarkable. Other: Evaluation limited by motion throughout the examination. No free air or free fluid. Musculoskeletal: Scattered degenerative changes of the spine and pelvis. Review of the MIP images confirms the above findings. IMPRESSION: Evaluation limited by motion particularly of the abdomen and pelvis. No segmental  or larger pulmonary embolism. Small right and tiny left effusion with consolidative opacity along the right lower lobe. Possible pneumonia. Recommend follow-up. Separate areas of patchy ground-glass interstitial septal thickening. Please correlate for any other etiology including a component of edema. Large amount of colonic stool. No bowel obstruction, free air or free fluid. Electronically Signed   By: Karen Kays M.D.   On: 02/11/2023 10:39   CT Angio Chest PE W/Cm &/Or Wo Cm  Result Date: 02/11/2023 CLINICAL DATA:  Chest pain and shortness of breath for a few weeks. Nonlocalized pain, anemia and weight loss. EXAM: CT ANGIOGRAPHY CHEST CT ABDOMEN AND PELVIS WITH CONTRAST TECHNIQUE: Multidetector CT imaging of the chest was performed using the standard protocol during bolus administration of intravenous contrast. Multiplanar CT image reconstructions and MIPs were obtained to evaluate the vascular anatomy. Multidetector CT imaging of the abdomen and pelvis was performed using the standard protocol during bolus administration of intravenous contrast. RADIATION DOSE REDUCTION: This exam was performed according to the departmental dose-optimization program which includes automated exposure control, adjustment of the mA and/or kV according to patient size and/or use of iterative reconstruction technique. CONTRAST:  80mL OMNIPAQUE IOHEXOL 350 MG/ML SOLN COMPARISON:  Chest x-ray earlier 02/11/2023 and older FINDINGS: CTA CHEST FINDINGS Cardiovascular: No segmental or larger pulmonary embolism identified. Heart is mildly enlarged. Small pericardial effusion. The thoracic aorta has a normal course and caliber with mild atherosclerotic calcified plaque. Mediastinum/Nodes: No specific abnormal lymph node enlargement identified in the axillary regions. There are several prominent small mediastinal and hilar lymph nodes. More numerous than usually seen but predominantly less than 1 cm in size in short axis and not  pathologic by size criteria. Small hiatal hernia. Preserved thyroid gland. Lungs/Pleura: Small right and tiny left pleural effusion. Diffuse patchy ground-glass opacity seen in the lungs with some areas of interstitial thickening. Please correlate for a component of edema. There is a consolidative opacity as well in the right lower lobe. Possible separate infiltrate or pneumonia. Associated areas of bronchial wall  thickening bilaterally. Greatest in the right lower lobe. Musculoskeletal: Mild degenerative changes along the spine. Review of the MIP images confirms the above findings. CT ABDOMEN and PELVIS FINDINGS Hepatobiliary: No focal liver abnormality is seen. No gallstones, gallbladder wall thickening, or biliary dilatation. Pancreas: Unremarkable. No pancreatic ductal dilatation or surrounding inflammatory changes. Spleen: Normal in size without focal abnormality. Adrenals/Urinary Tract: Adrenal glands are unremarkable. Kidneys are normal, without renal calculi, focal lesion, or hydronephrosis. Bladder is unremarkable. Stomach/Bowel: Large amount of colonic stool. The bowel is nondilated. No oral contrast. Vascular/Lymphatic: Aortic atherosclerosis. No enlarged abdominal or pelvic lymph nodes. Reproductive: Prostate is unremarkable. Other: Evaluation limited by motion throughout the examination. No free air or free fluid. Musculoskeletal: Scattered degenerative changes of the spine and pelvis. Review of the MIP images confirms the above findings. IMPRESSION: Evaluation limited by motion particularly of the abdomen and pelvis. No segmental or larger pulmonary embolism. Small right and tiny left effusion with consolidative opacity along the right lower lobe. Possible pneumonia. Recommend follow-up. Separate areas of patchy ground-glass interstitial septal thickening. Please correlate for any other etiology including a component of edema. Large amount of colonic stool. No bowel obstruction, free air or free fluid.  Electronically Signed   By: Karen Kays M.D.   On: 02/11/2023 10:39   DG Chest 2 View  Result Date: 02/11/2023 CLINICAL DATA:  Chest pain and shortness of breath. EXAM: CHEST - 2 VIEW COMPARISON:  10/27/2017. FINDINGS: Low lung volumes accentuate the pulmonary vasculature and cardiomediastinal silhouette. No consolidation or pulmonary edema. No pleural effusion or pneumothorax. Visualized bones and upper abdomen are unremarkable. IMPRESSION: No evidence of acute cardiopulmonary disease. Electronically Signed   By: Orvan Falconer M.D.   On: 02/11/2023 08:57     Assessment and Plan:   Preoperative cardiac evaluation for EGD and colonoscopy Acute anemia Elevated troponin Elevated BNP Chest pain Patient found to have acute anemia with a hemoglobin of 6.8 s/p 1 unit PRBC.  GI planning colonoscopy and endoscopy tomorrow.  BNP was mildly elevated to 262.  High-sensitivity troponin was minimally elevated at 5, 30, 27, 28.  He reports left sided dull chest pain in the setting of acute anemia and possible PNA. He has no prior cardiac history. RF for CAD include HTN, HLD and DM2.  No h/o exertional chest pain. Echo has been ordered, result is pending   No IV heparin or aspirin given acute anemia.  Elevated trop may be supply demand mismatch. Recent LDL 17 and A1c 5.7. Patient has no active chest pain. He appears euvolemic on exam. EKG shows normal sinus rhythm with with T wave inversion in lead III, minimal ST depression V3 to V6, old changes.  Patient may ultimately benefit from further workup after EGD and colonoscopy however can be as an outpatient. METS>4. According to the RCRI patient is class II risk, 6% of 30-day risk of death, MI, or cardiac arrest. If echo is normal, OK to pursue procedure. MD to see.  For questions or updates, please contact [facility] Please consult [email or web address] for contact info under    Signed, Graycen Degan David Stall, PA-C  02/13/2023 9:24 AM

## 2023-02-13 NOTE — Plan of Care (Signed)

## 2023-02-13 NOTE — Assessment & Plan Note (Signed)
Blood pressure mildly elevated. Patient uses olmesartan and HCTZ at home. -Holding home antihypertensives due to AKI -As needed hydralazine

## 2023-02-14 ENCOUNTER — Inpatient Hospital Stay: Payer: Medicare HMO | Admitting: Anesthesiology

## 2023-02-14 ENCOUNTER — Encounter: Payer: Self-pay | Admitting: Internal Medicine

## 2023-02-14 ENCOUNTER — Encounter
Admission: EM | Disposition: A | Discharge status: Home / Self Care | Payer: Self-pay | Source: Home / Self Care | Attending: Internal Medicine

## 2023-02-14 DIAGNOSIS — I2489 Other forms of acute ischemic heart disease: Secondary | ICD-10-CM | POA: Diagnosis not present

## 2023-02-14 DIAGNOSIS — R531 Weakness: Secondary | ICD-10-CM | POA: Diagnosis not present

## 2023-02-14 DIAGNOSIS — D649 Anemia, unspecified: Secondary | ICD-10-CM | POA: Diagnosis not present

## 2023-02-14 DIAGNOSIS — J189 Pneumonia, unspecified organism: Secondary | ICD-10-CM | POA: Diagnosis not present

## 2023-02-14 DIAGNOSIS — J9601 Acute respiratory failure with hypoxia: Secondary | ICD-10-CM | POA: Diagnosis not present

## 2023-02-14 HISTORY — PX: COLONOSCOPY: SHX5424

## 2023-02-14 LAB — CBC
HCT: 23.4 % — ABNORMAL LOW (ref 39.0–52.0)
Hemoglobin: 8 g/dL — ABNORMAL LOW (ref 13.0–17.0)
MCH: 33.8 pg (ref 26.0–34.0)
MCHC: 34.2 g/dL (ref 30.0–36.0)
MCV: 98.7 fL (ref 80.0–100.0)
Platelets: 172 10*3/uL (ref 150–400)
RBC: 2.37 MIL/uL — ABNORMAL LOW (ref 4.22–5.81)
RDW: 14.7 % (ref 11.5–15.5)
WBC: 4.3 10*3/uL (ref 4.0–10.5)
nRBC: 0 % (ref 0.0–0.2)

## 2023-02-14 LAB — HEMOGLOBIN A1C
Hgb A1c MFr Bld: 5.7 % — ABNORMAL HIGH (ref 4.8–5.6)
Mean Plasma Glucose: 117 mg/dL

## 2023-02-14 LAB — GLUCOSE, CAPILLARY
Glucose-Capillary: 74 mg/dL (ref 70–99)
Glucose-Capillary: 46 mg/dL — ABNORMAL LOW (ref 70–99)
Glucose-Capillary: 97 mg/dL (ref 70–99)
Glucose-Capillary: 69 mg/dL — ABNORMAL LOW (ref 70–99)
Glucose-Capillary: 62 mg/dL — ABNORMAL LOW (ref 70–99)

## 2023-02-14 LAB — C-REACTIVE PROTEIN: CRP: 0.6 mg/dL (ref <1.0)

## 2023-02-14 LAB — CULTURE, BLOOD (ROUTINE X 2): Culture: NO GROWTH

## 2023-02-14 LAB — LEGIONELLA PNEUMOPHILA SEROGP 1 UR AG: L. pneumophila Serogp 1 Ur Ag: NEGATIVE

## 2023-02-14 SURGERY — COLONOSCOPY
Anesthesia: General

## 2023-02-14 MED ORDER — AZITHROMYCIN 250 MG PO TABS: ORAL_TABLET | ORAL | 0 refills | Status: DC

## 2023-02-14 MED ORDER — ACETAMINOPHEN 325 MG PO TABS: 650.0000 mg | ORAL_TABLET | Freq: Four times a day (QID) | ORAL | 1 refills | Status: AC | PRN

## 2023-02-14 MED ORDER — LIDOCAINE HCL (CARDIAC) PF 100 MG/5ML IV SOSY
PREFILLED_SYRINGE | INTRAVENOUS | Status: DC | PRN
  Administered 2023-02-14: 100 mg via INTRAVENOUS

## 2023-02-14 MED ORDER — PROPOFOL 500 MG/50ML IV EMUL
INTRAVENOUS | Status: DC | PRN
  Administered 2023-02-14: 140 ug/kg/min via INTRAVENOUS

## 2023-02-14 MED ORDER — AMOXICILLIN-POT CLAVULANATE 875-125 MG PO TABS: 1.0000 | ORAL_TABLET | Freq: Two times a day (BID) | ORAL | 0 refills | Status: AC

## 2023-02-14 MED ORDER — DEXTROSE 50 % IV SOLN
INTRAVENOUS | Status: AC
  Administered 2023-02-14: 50 mL
  Filled 2023-02-14: qty 50

## 2023-02-14 MED ORDER — FE FUM-VIT C-VIT B12-FA 460-60-0.01-1 MG PO CAPS: 1.0000 | ORAL_CAPSULE | Freq: Two times a day (BID) | ORAL | 3 refills | Status: DC

## 2023-02-14 MED ORDER — PHENYLEPHRINE 80 MCG/ML (10ML) SYRINGE FOR IV PUSH (FOR BLOOD PRESSURE SUPPORT)
PREFILLED_SYRINGE | INTRAVENOUS | Status: AC
  Filled 2023-02-14: qty 10

## 2023-02-14 MED ORDER — LIDOCAINE HCL (PF) 2 % IJ SOLN
INTRAMUSCULAR | Status: AC
  Filled 2023-02-14: qty 40

## 2023-02-14 MED ORDER — ADULT MULTIVITAMIN W/MINERALS CH: 1.0000 | ORAL_TABLET | Freq: Every day | ORAL | 1 refills | Status: AC

## 2023-02-14 MED ORDER — PHENYLEPHRINE 80 MCG/ML (10ML) SYRINGE FOR IV PUSH (FOR BLOOD PRESSURE SUPPORT)
PREFILLED_SYRINGE | INTRAVENOUS | Status: DC | PRN
  Administered 2023-02-14: 80 ug via INTRAVENOUS

## 2023-02-14 MED ORDER — DEXTROSE 50 % IV SOLN
25.0000 mL | Freq: Once | INTRAVENOUS | Status: AC
  Administered 2023-02-14: 25 mL via INTRAVENOUS

## 2023-02-14 MED ORDER — STERILE WATER FOR IRRIGATION IR SOLN
Status: DC | PRN
  Administered 2023-02-14: 50 mL

## 2023-02-14 MED ORDER — PROPOFOL 10 MG/ML IV BOLUS
INTRAVENOUS | Status: DC | PRN
  Administered 2023-02-14: 60 mg via INTRAVENOUS

## 2023-02-14 MED ORDER — SODIUM CHLORIDE 0.9 % IV SOLN: INTRAVENOUS | Status: DC

## 2023-02-14 NOTE — Plan of Care (Signed)

## 2023-02-14 NOTE — Progress Notes (Signed)
Cardiology Progress Note   Patient Name: Victor Phillips Date of Encounter: 02/14/2023  Primary Cardiologist: None - new  Subjective   No chest pain or sob this AM.  Awaiting EGD.  Denies melena/brbpr.  Inpatient Medications    Scheduled Meds:  atorvastatin  10 mg Oral Daily   Fe Fum-Vit C-Vit B12-FA  1 capsule Oral QPC breakfast   furosemide  60 mg Intravenous Daily   insulin aspart  0-15 Units Subcutaneous TID WC   insulin aspart  0-5 Units Subcutaneous QHS   multivitamin with minerals  1 tablet Oral Daily   pantoprazole (PROTONIX) IV  40 mg Intravenous Q12H   Continuous Infusions:  azithromycin 500 mg (02/14/23 1232)   cefTRIAXone (ROCEPHIN)  IV 2 g (02/14/23 1227)   PRN Meds: acetaminophen **OR** acetaminophen, hydrALAZINE   Vital Signs    Vitals:   02/13/23 1615 02/13/23 1641 02/13/23 2128 02/14/23 0744  BP: 124/77 136/82 (!) 157/79 (!) 159/77  Pulse: 67 75 85 76  Resp: 20 18 20 14   Temp:  97.6 F (36.4 C) 98.1 F (36.7 C) 97.9 F (36.6 C)  TempSrc:      SpO2: 96% 97%  97%  Weight:      Height:        Intake/Output Summary (Last 24 hours) at 02/14/2023 1249 Last data filed at 02/14/2023 0351 Gross per 24 hour  Intake 1090.12 ml  Output --  Net 1090.12 ml   Filed Weights   02/12/23 1418  Weight: 77.6 kg    Physical Exam   GEN: Well nourished, well developed, in no acute distress.  HEENT: Grossly normal.  Neck: Supple, no JVD, carotid bruits, or masses. Cardiac: RRR, no murmurs, rubs, or gallops. No clubbing, cyanosis, edema.  Radials 2+, DP/PT 2+ and equal bilaterally.  Respiratory:  Respirations regular and unlabored, clear to auscultation bilaterally. GI: Soft, nontender, nondistended, BS + x 4. MS: no deformity or atrophy. Skin: warm and dry, no rash. Neuro:  Strength and sensation are intact. Psych: AAOx3.  Normal affect.  Labs    Chemistry Recent Labs  Lab 02/11/23 0905 02/12/23 0326 02/13/23 0329  NA 133* 135 134*  K 4.6 4.4 4.5   CL 104 104 100  CO2 22 25 25   GLUCOSE 181* 71 90  BUN 35* 34* 33*  CREATININE 1.43* 1.31* 1.41*  CALCIUM 8.2* 8.6* 8.7*  PROT 9.5*  --   --   ALBUMIN 2.5*  --   --   AST 22  --   --   ALT 23  --   --   ALKPHOS 70  --   --   BILITOT 0.5  --   --   GFRNONAA 54* >60 55*  ANIONGAP 7 6 9      Hematology Recent Labs  Lab 02/12/23 0326 02/13/23 0329 02/14/23 0359  WBC 6.0 5.2 4.3  RBC 2.32* 2.44* 2.37*  HGB 7.6* 8.1* 8.0*  HCT 23.2* 23.9* 23.4*  MCV 100.0 98.0 98.7  MCH 32.8 33.2 33.8  MCHC 32.8 33.9 34.2  RDW 17.0* 15.6* 14.7  PLT 157 177 172    Cardiac Enzymes  Recent Labs  Lab 02/11/23 0905 02/11/23 1102 02/11/23 1742 02/11/23 1937  TROPONINIHS 25* 30* 27* 28*      BNP    Component Value Date/Time   BNP 267.2 (H) 02/11/2023 0905    Lipids  Lab Results  Component Value Date   CHOL 61 02/13/2023   HDL 24 (L) 02/13/2023   LDLCALC  22 02/13/2023   TRIG 74 02/13/2023   CHOLHDL 2.5 02/13/2023    HbA1c  Lab Results  Component Value Date   HGBA1C 5.7 (H) 02/13/2023    Radiology    DG Chest Port 1 View  Result Date: 02/13/2023 CLINICAL DATA:  Interstitial edema. EXAM: PORTABLE CHEST 1 VIEW COMPARISON:  Feb 11, 2023 FINDINGS: The cardiomediastinal silhouette is stable. No pneumothorax. Increasing bilateral diffuse pulmonary opacities, largely interstitial, suggesting pulmonary edema. No other acute abnormalities. IMPRESSION: Pulmonary edema. Electronically Signed   By: Gerome Sam III M.D.   On: 02/13/2023 13:07   US RENAL  Result Date: 02/11/2023 CLINICAL DATA:  Acute kidney insufficiency EXAM: RENAL / URINARY TRACT ULTRASOUND COMPLETE COMPARISON:  CT earlier 02/11/2019 FINDINGS: Right Kidney: Renal measurements: 11.7 x 6.8 x 6.3 cm = volume: 262.4 mL. Mild parenchymal atrophy prominent renal sinus fat. No perinephric fluid. Left Kidney: Renal measurements: 12.0 x 7.1 x 5.6 cm = volume: 250 mL. Mild parenchymal atrophy with prominent renal sinus fat. No  collecting system dilatation or perinephric fluid. Bladder: Distended with fluid.  Preserved contour.  Ureteral jets are seen. Other: None. IMPRESSION: Mild bilateral renal atrophy. No collecting system dilatation. Please correlate with separate contrast CT from same day Electronically Signed   By: Karen Kays M.D.   On: 02/11/2023 15:20   CT ABDOMEN PELVIS W CONTRAST  Result Date: 02/11/2023 CLINICAL DATA:  Chest pain and shortness of breath for a few weeks. Nonlocalized pain, anemia and weight loss. EXAM: CT ANGIOGRAPHY CHEST CT ABDOMEN AND PELVIS WITH CONTRAST TECHNIQUE: Multidetector CT imaging of the chest was performed using the standard protocol during bolus administration of intravenous contrast. Multiplanar CT image reconstructions and MIPs were obtained to evaluate the vascular anatomy. Multidetector CT imaging of the abdomen and pelvis was performed using the standard protocol during bolus administration of intravenous contrast. RADIATION DOSE REDUCTION: This exam was performed according to the departmental dose-optimization program which includes automated exposure control, adjustment of the mA and/or kV according to patient size and/or use of iterative reconstruction technique. CONTRAST:  80mL OMNIPAQUE IOHEXOL 350 MG/ML SOLN COMPARISON:  Chest x-ray earlier 02/11/2023 and older FINDINGS: CTA CHEST FINDINGS Cardiovascular: No segmental or larger pulmonary embolism identified. Heart is mildly enlarged. Small pericardial effusion. The thoracic aorta has a normal course and caliber with mild atherosclerotic calcified plaque. Mediastinum/Nodes: No specific abnormal lymph node enlargement identified in the axillary regions. There are several prominent small mediastinal and hilar lymph nodes. More numerous than usually seen but predominantly less than 1 cm in size in short axis and not pathologic by size criteria. Small hiatal hernia. Preserved thyroid gland. Lungs/Pleura: Small right and tiny left  pleural effusion. Diffuse patchy ground-glass opacity seen in the lungs with some areas of interstitial thickening. Please correlate for a component of edema. There is a consolidative opacity as well in the right lower lobe. Possible separate infiltrate or pneumonia. Associated areas of bronchial wall thickening bilaterally. Greatest in the right lower lobe. Musculoskeletal: Mild degenerative changes along the spine. Review of the MIP images confirms the above findings. CT ABDOMEN and PELVIS FINDINGS Hepatobiliary: No focal liver abnormality is seen. No gallstones, gallbladder wall thickening, or biliary dilatation. Pancreas: Unremarkable. No pancreatic ductal dilatation or surrounding inflammatory changes. Spleen: Normal in size without focal abnormality. Adrenals/Urinary Tract: Adrenal glands are unremarkable. Kidneys are normal, without renal calculi, focal lesion, or hydronephrosis. Bladder is unremarkable. Stomach/Bowel: Large amount of colonic stool. The bowel is nondilated. No oral contrast. Vascular/Lymphatic: Aortic atherosclerosis.  No enlarged abdominal or pelvic lymph nodes. Reproductive: Prostate is unremarkable. Other: Evaluation limited by motion throughout the examination. No free air or free fluid. Musculoskeletal: Scattered degenerative changes of the spine and pelvis. Review of the MIP images confirms the above findings. IMPRESSION: Evaluation limited by motion particularly of the abdomen and pelvis. No segmental or larger pulmonary embolism. Small right and tiny left effusion with consolidative opacity along the right lower lobe. Possible pneumonia. Recommend follow-up. Separate areas of patchy ground-glass interstitial septal thickening. Please correlate for any other etiology including a component of edema. Large amount of colonic stool. No bowel obstruction, free air or free fluid. Electronically Signed   By: Karen Kays M.D.   On: 02/11/2023 10:39   CT Angio Chest PE W/Cm &/Or Wo  Cm  Result Date: 02/11/2023 CLINICAL DATA:  Chest pain and shortness of breath for a few weeks. Nonlocalized pain, anemia and weight loss. EXAM: CT ANGIOGRAPHY CHEST CT ABDOMEN AND PELVIS WITH CONTRAST TECHNIQUE: Multidetector CT imaging of the chest was performed using the standard protocol during bolus administration of intravenous contrast. Multiplanar CT image reconstructions and MIPs were obtained to evaluate the vascular anatomy. Multidetector CT imaging of the abdomen and pelvis was performed using the standard protocol during bolus administration of intravenous contrast. RADIATION DOSE REDUCTION: This exam was performed according to the departmental dose-optimization program which includes automated exposure control, adjustment of the mA and/or kV according to patient size and/or use of iterative reconstruction technique. CONTRAST:  80mL OMNIPAQUE IOHEXOL 350 MG/ML SOLN COMPARISON:  Chest x-ray earlier 02/11/2023 and older FINDINGS: CTA CHEST FINDINGS Cardiovascular: No segmental or larger pulmonary embolism identified. Heart is mildly enlarged. Small pericardial effusion. The thoracic aorta has a normal course and caliber with mild atherosclerotic calcified plaque. Mediastinum/Nodes: No specific abnormal lymph node enlargement identified in the axillary regions. There are several prominent small mediastinal and hilar lymph nodes. More numerous than usually seen but predominantly less than 1 cm in size in short axis and not pathologic by size criteria. Small hiatal hernia. Preserved thyroid gland. Lungs/Pleura: Small right and tiny left pleural effusion. Diffuse patchy ground-glass opacity seen in the lungs with some areas of interstitial thickening. Please correlate for a component of edema. There is a consolidative opacity as well in the right lower lobe. Possible separate infiltrate or pneumonia. Associated areas of bronchial wall thickening bilaterally. Greatest in the right lower lobe.  Musculoskeletal: Mild degenerative changes along the spine. Review of the MIP images confirms the above findings. CT ABDOMEN and PELVIS FINDINGS Hepatobiliary: No focal liver abnormality is seen. No gallstones, gallbladder wall thickening, or biliary dilatation. Pancreas: Unremarkable. No pancreatic ductal dilatation or surrounding inflammatory changes. Spleen: Normal in size without focal abnormality. Adrenals/Urinary Tract: Adrenal glands are unremarkable. Kidneys are normal, without renal calculi, focal lesion, or hydronephrosis. Bladder is unremarkable. Stomach/Bowel: Large amount of colonic stool. The bowel is nondilated. No oral contrast. Vascular/Lymphatic: Aortic atherosclerosis. No enlarged abdominal or pelvic lymph nodes. Reproductive: Prostate is unremarkable. Other: Evaluation limited by motion throughout the examination. No free air or free fluid. Musculoskeletal: Scattered degenerative changes of the spine and pelvis. Review of the MIP images confirms the above findings. IMPRESSION: Evaluation limited by motion particularly of the abdomen and pelvis. No segmental or larger pulmonary embolism. Small right and tiny left effusion with consolidative opacity along the right lower lobe. Possible pneumonia. Recommend follow-up. Separate areas of patchy ground-glass interstitial septal thickening. Please correlate for any other etiology including a component of edema. Large amount  of colonic stool. No bowel obstruction, free air or free fluid. Electronically Signed   By: Karen Kays M.D.   On: 02/11/2023 10:39   DG Chest 2 View  Result Date: 02/11/2023 CLINICAL DATA:  Chest pain and shortness of breath. EXAM: CHEST - 2 VIEW COMPARISON:  10/27/2017. FINDINGS: Low lung volumes accentuate the pulmonary vasculature and cardiomediastinal silhouette. No consolidation or pulmonary edema. No pleural effusion or pneumothorax. Visualized bones and upper abdomen are unremarkable. IMPRESSION: No evidence of acute  cardiopulmonary disease. Electronically Signed   By: Orvan Falconer M.D.   On: 02/11/2023 08:57    Telemetry    Non-tele   Cardiac Studies   2D Echocardiogram 6.2.2024  1. Left ventricular ejection fraction, by estimation, is 60 to 65%. The  left ventricle has normal function. The left ventricle has no regional  wall motion abnormalities. Left ventricular diastolic parameters are  consistent with Grade II diastolic  dysfunction (pseudonormalization). Elevated left ventricular end-diastolic  pressure. The average left ventricular global longitudinal strain is -19.2  %. The global longitudinal strain is normal.   2. Right ventricular systolic function is normal. The right ventricular  size is normal. There is normal pulmonary artery systolic pressure.   3. The mitral valve is normal in structure. Trivial mitral valve  regurgitation. No evidence of mitral stenosis.   4. The aortic valve is tricuspid. Aortic valve regurgitation is not  visualized. No aortic stenosis is present.   5. There is dilatation of the ascending aorta, measuring 38 mm.   6. The inferior vena cava is dilated in size with >50% respiratory  variability, suggesting right atrial pressure of 8 mmHg.   Patient Profile     67 y.o. male with a hx of hypertension, hyperlipidemia, CKD III, and diabetes, who was admitted 5/31 w/ weakness, chest pain, and demand ischemia in the setting of anemia (H/H 6.8/21.5).  Assessment & Plan    1.  Demand Ischemia:  pt presented 5/31 w/ weakness, chest discomfort, and normocytic anemia (6.8/21.5).  Pending colonoscopy today.  No further c/p.  Echo w/ EF 60-65%, GrII DD, nl RV fxn, triv MR.  Suspect demand ischemia.  No plan for inpatient cardiac eval but will consider outpt eval pending anemia w/u and recovery.  Cont statin rx.  Plan to resume antiplatelet rx when cleared by GI.  2.  Normocytic anemia:  stable this AM.  colonoscopy today.  3.  HTN:  Home ARB/diuretic on hold in  setting of presumed AKI, though I note that creat trending 1.3-1.5 as outpt over past several years (care everywhere).  BP higher this AM.  Look to resume ARB therapy.  4.  Acute hypoxic resp failure/PNA:  Seen by pulm.  On abx.  Denies dyspnea, lying flat.  5.  CKD III:  review of outpt labs shows that admission values in-line/stable.  Plan to resume ARB when felt to be appropriate.  6.  HL:  Cont statin rx.  7. DMII: per IM.  Signed, Nicolasa Ducking, NP  02/14/2023, 12:49 PM    For questions or updates, please contact   Please consult [email or web address] for contact info under Cardiology/STEMI.

## 2023-02-14 NOTE — Progress Notes (Signed)
Discharge instructions reviewed with patient. He verbalized understanding of instructions. Pt instructed to call to make his appointments.

## 2023-02-14 NOTE — Op Note (Signed)
[facility] Gastroenterology Patient Name: Victor Phillips Procedure Date: 02/14/2023 2:34 PM MRN: [id number] Account #: [id number] Date of Birth: 03-22-1956 Admit Type: Inpatient Age: 67 Room: [facility] ENDO ROOM 2 Gender: Male Note Status: Finalized Instrument Name: Prentice Docker [id number] Procedure:             Colonoscopy Indications:           Heme positive stool, Iron deficiency anemia secondary                         to chronic blood loss Providers:             Boykin Nearing. Norma Fredrickson MD, MD Referring MD:          Danella Penton, MD (Referring MD) Medicines:             Propofol per Anesthesia Complications:         No immediate complications. Procedure:             Pre-Anesthesia Assessment:                        - The risks and benefits of the procedure and the                         sedation options and risks were discussed with the                         patient. All questions were answered and informed                         consent was obtained.                        - Patient identification and proposed procedure were                         verified prior to the procedure by the nurse. The                         procedure was verified in the procedure room.                        - ASA Grade Assessment: III - A patient with severe                         systemic disease.                        - After reviewing the risks and benefits, the patient                         was deemed in satisfactory condition to undergo the                         procedure.                        After obtaining informed consent, the colonoscope was                         passed  under direct vision. Throughout the procedure,                         the patient's blood pressure, pulse, and oxygen                         saturations were monitored continuously. The                         Colonoscope was introduced through the anus and                         advanced to the  the cecum, identified by appendiceal                         orifice and ileocecal valve. The colonoscopy was                         performed without difficulty. The patient tolerated                         the procedure well. The quality of the bowel                         preparation was fair. The ileocecal valve, appendiceal                         orifice, and rectum were photographed. Findings:      The perianal and digital rectal examinations were normal. Pertinent       negatives include normal sphincter tone and no palpable rectal lesions.      A moderate amount of stool was found in the entire colon, making       visualization difficult. Lavage of the area was performed using a       moderate amount, resulting in clearance with fair visualization.       Estimated blood loss: none.      The exam was otherwise without abnormality on direct and retroflexion       views. Impression:            - Preparation of the colon was fair.                        - Stool in the entire examined colon.                        - The examination was otherwise normal on direct and                         retroflexion views.                        - No specimens collected. Recommendation:        - Return patient to hospital ward for possible                         discharge same day.                        - To visualize the small bowel, perform  video capsule                         endoscopy at appointment to be scheduled.                        - Return to my office in 3 weeks.                        - The findings and recommendations were discussed with                         the patient. Procedure Code(s):     --- Professional ---                        [phone], Colonoscopy, flexible; diagnostic, including                         collection of specimen(s) by brushing or washing, when                         performed (separate procedure) Diagnosis Code(s):     --- Professional ---                         D50.0, Iron deficiency anemia secondary to blood loss                         (chronic)                        R19.5, Other fecal abnormalities CPT copyright 2022 [employer]. All rights reserved. The codes documented in this report are preliminary and upon coder review may  be revised to meet current compliance requirements. Stanton Kidney MD, MD 02/14/2023 4:11:51 PM This report has been signed electronically. Number of Addenda: 0 Note Initiated On: 02/14/2023 2:34 PM Scope Withdrawal Time: 0 hours 7 minutes 8 seconds  Total Procedure Duration: 0 hours 13 minutes 51 seconds  Estimated Blood Loss:  Estimated blood loss: none.      [facility]

## 2023-02-14 NOTE — Plan of Care (Signed)

## 2023-02-14 NOTE — Care Management Important Message (Signed)
Important Message  Patient Details  Name: KYRO THOROUGHMAN MRN: [id number] Date of Birth: [birth year]   Medicare Important Message Given:  N/A - LOS <3 / Initial given by admissions     Olegario Messier A Carlie Solorzano 02/14/2023, 11:11 AM

## 2023-02-14 NOTE — Discharge Summary (Signed)
Physician Discharge Summary   Patient: Victor Phillips MRN: [id number] DOB: April 10, 1956  Admit date:     02/11/2023  Discharge date: 02/14/23  Discharge Physician: Arnetha Courser   PCP: Danella Penton, MD   Recommendations at discharge:  Please obtain CBC and BMP in 1 week Patient will need a repeat CT chest in 3 to 4 weeks to see the resolution of infiltrate. Follow-up with gastroenterology in 2 to 3 weeks for capsule endoscopy Patient might need a referral to see an hematologist if anemia persist. Follow-up with primary care provider within a week  Discharge Diagnoses: Principal Problem:   Acute hypoxic respiratory failure (HCC) Active Problems:   CAP (community acquired pneumonia)   Anemia   Elevated troponin   AKI (acute kidney injury) (HCC)   Essential hypertension   Type 2 diabetes mellitus (HCC)   Hypercholesteremia   Weakness   Hospital Course:  Victor Phillips is a 67 y.o. male with medical history significant of type 2 diabetes and hypertension came to ED with complaint of intermittent chest pain and worsening fatigue and weakness for the past few week.   Patient saw his PCP for worsening fatigue and weakness for the past few weeks, found to have hemoglobin of 8.2.  He was started on iron supplement and CT chest, abdomen and pelvis was ordered.  Labs done during that visit was mostly normal which include A1c of 5.7, normal TSH and low B12 at 286.   Due to worsening weakness and fatigue he presented to ED.  ED course and data reviewed.  Vital stable except mild hypoxia at 87% requiring 2 L of oxygen.  Labs pertinent for hemoglobin of 6.8, MCV 106, BUN 35, creatinine 1.43, albumin 2.5, GFR 54, BNP 267, COVID, flu and RSV negative, troponin 25>30, LA 1.0. Chest x-ray was negative for any acute abnormality. CTA chest was negative for PE but did show small right and tiny left effusions with consolidative opacity along the right lower lobe, possible pneumonia.  Also noted to  have separate areas of patchy groundglass and interstitial septal thickening which can be due to component of her edema. CT abdomen and pelvis was motion degraded but only shows large amount of colonic stool.   EKG.  Personally reviewed.  Normal sinus rhythm, right axis and T wave inversion in lead III and aVF.  No other significant abnormality.   Patient was started on Zithromax and ceftriaxone in ED.  Also ordered 1 unit of PRBC.  6/1: Hemodynamically stable.  Procalcitonin negative.  Hemoglobin improved to 7.6 after 1 unit of PRBC.  GI planning EGD and colonoscopy tomorrow.  Pulmonary was also consulted for significant right lower lobe opacity/infiltrate.  Might need further diagnostic procedure.  6/2: Remains stable.  Initially anesthesia refused due to incomplete cardiac workup and the patient with no prior cardiac history.  Cardiology was consulted for clearance and urgent echo was done by cardiology which was essentially normal.  Now going for EGD and colonoscopy later today.  No upper respiratory symptoms.  Respiratory viral panel negative.  Lipid panel normal with LDL of 22 and HDL of 24.  6/3: Vitals stable.  Hemoglobin at 8.  Patient had a repeat colonoscopy today due to insufficient prep yesterday.  It was negative for any significant abnormality.  EGD yesterday shows mild gastritis.  Patient was advised to stop using NSAID and he will follow-up with GI as outpatient for capsule endoscopy.  CRP which was done for the past 3 days is  still pending, apparently it was sent to [facility] due to machine being down at [facility].  A1c at 5.7.  Improving cough.  Patient is being discharged on 3 more days of Augmentin and need to have a close follow-up with outpatient pulmonary for that concern of infiltrate.  If he continues to drop hemoglobin without any obvious bleeding, will need to be evaluated by a hematologist.  Patient is being discharged at his request, he will continue on current  medications and need to have a close follow-up with his providers for further recommendations.  Assessment and Plan: * Acute hypoxic respiratory failure (HCC) Patient was hypoxic up to 87% on room air, requiring up to 2 L of oxygen with no prior oxygen use.  No history of smoking or COPD.  Currently on room air at rest Concern of right lower lobe infiltrate and some surrounding opacities on CT chest.  CTA negative for PE, procalcitonin negative.  RVP negative.  Pending CRP -Consulted pulmonology -Continue with supportive care  CAP (community acquired pneumonia) CT chest concerning for a large right lower lobe infiltrate.  Patient to have some cough for the past few days.  No fever or chills.  No leukocytosis.  COVID, influenza and RSV negative.  CRP and respiratory viral panel was ordered by pulmonary.  Procalcitonin negative. Pulmonary was consulted as he might need more diagnostic procedure -Continue ceftriaxone and Zithromax-will complete a 5-day course -Check strep pneumo and Legionella antigen-pending -Patient will need a repeat CT chest in 4 to 6 weeks to see the resolution of these findings.  Anemia Patient with worsening fatigue for the past few week.  Saw PCP and found to have hemoglobin of 8.2 on 5/16.  No obvious bleeding but do use ibuprofen on a daily basis.  Hemoglobin at 8.1 today, s/p 1 unit of PRBC anemia panel with some iron deficiency and anemia of chronic disease.  Recent check of B12 at PCP office on 5/16 at 236. Patient also has macrocytosis.  Folate looks normal. Also has an history of unintentional weight loss over the past few month. -GI was consulted-going for EGD and colonoscopy today -Avoid NSAIDs -IV protonix  Elevated troponin Intermittent chest pain. Patient has some intermittent atypical chest pain, there is some exertional component, nonradiating and no associated factors. Troponin barely positive with a flat curve. CT chest with concerning of bilateral  small pleural effusions and groundglass opacities which can be due to pulmonary edema. BNP at 267.  No peripheral edema.  No prior cardiac history -Echocardiogram-done urgently by cardiology and it was negative for any significant abnormality.  Cardiology cleared him for anesthesia -Monitor volume status  AKI (acute kidney injury) (HCC) Creatinine at 1.43>>1.31>>1.41 with baseline appears to be around 1.2.  Mild AKI with decrease of GFR to 55.  Did receive contrast with CTA -Gentle IV fluid -Renal ultrasound-with mild bilateral renal atrophy.  No collecting system dilatation -Monitor renal function -Avoid nephrotoxins -Holding home HCTZ and irbesartan  Essential hypertension Blood pressure mildly elevated. Patient uses olmesartan and HCTZ at home. -Holding home antihypertensives due to AKI -As needed hydralazine  Weakness Most likely secondary to worsening anemia. -PT evaluation  Hypercholesteremia -Continue home statin  Type 2 diabetes mellitus (HCC) Patient was on metformin and Amaryl at home.  Recent A1c of 5.7 -SSI   Consultants: Pulmonology.  Gastroenterology Procedures performed: EGD and colonoscopy Disposition: Home Diet recommendation:  Discharge Diet Orders (From admission, onward)     Start     Ordered  02/14/23 0000  Diet - low sodium heart healthy        02/14/23 1634           Cardiac and Carb modified diet DISCHARGE MEDICATION: Allergies as of 02/14/2023   No Known Allergies      Medication List     STOP taking these medications    aspirin 325 MG tablet   ferrous gluconate 324 MG tablet Commonly known as: FERGON   ibuprofen 800 MG tablet Commonly known as: ADVIL       TAKE these medications    acetaminophen 325 MG tablet Commonly known as: TYLENOL Take 2 tablets (650 mg total) by mouth every 6 (six) hours as needed for mild pain (or Fever >/= 101).   amoxicillin-clavulanate 875-125 MG tablet Commonly known as: AUGMENTIN Take 1  tablet by mouth 2 (two) times daily for 3 days.   atorvastatin 10 MG tablet Commonly known as: LIPITOR Take 1 tablet by mouth daily.   azithromycin 250 MG tablet Commonly known as: ZITHROMAX Take 1 tablet daily starting from tomorrow for next 3 days   Fe Fum-Vit C-Vit B12-FA Caps capsule Commonly known as: TRIGELS-F FORTE Take 1 capsule by mouth 2 (two) times daily.   glimepiride 2 MG tablet Commonly known as: AMARYL Take 2 mg by mouth daily with breakfast.   metFORMIN 500 MG tablet Commonly known as: GLUCOPHAGE Take 1,000 mg by mouth. Take 2 tablets (1,000 mg total) by mouth every evening   multivitamin with minerals Tabs tablet Take 1 tablet by mouth daily. Start taking on: February 15, 2023   olmesartan-hydrochlorothiazide 20-12.5 MG tablet Commonly known as: BENICAR HCT Take 1 tablet by mouth daily.   pantoprazole 40 MG tablet Commonly known as: PROTONIX Take 40 mg by mouth daily.        Follow-up Information     Danella Penton, MD. Schedule an appointment as soon as possible for a visit in 1 week(s).   Specialty: Internal Medicine Contact information: [phone] [facility] [address] [facility] [place] [address] [phone]         Stanton Kidney, MD. Schedule an appointment as soon as possible for a visit in 3 week(s).   Specialty: Gastroenterology Contact information: [address] [place] [address] [phone]         Vida Rigger, MD. Schedule an appointment as soon as possible for a visit in 2 week(s).   Specialty: Pulmonary Disease Contact information: [address] [place] [address] [phone]                Discharge Exam: [name] Weights   02/12/23 1418  Weight: 77.6 kg   General.  Well-developed gentleman, in no acute distress. Pulmonary.  Lungs clear bilaterally, normal respiratory effort. CV.  Regular rate and rhythm, no JVD, rub or murmur. Abdomen.  Soft, nontender,  nondistended, BS positive. CNS.  Alert and oriented .  No focal neurologic deficit. Extremities.  No edema, no cyanosis, pulses intact and symmetrical. Psychiatry.  Judgment and insight appears normal.   Condition at discharge: stable  The results of significant diagnostics from this hospitalization (including imaging, microbiology, ancillary and laboratory) are listed below for reference.   Imaging Studies: DG Chest Port 1 View  Result Date: 02/13/2023 CLINICAL DATA:  Interstitial edema. EXAM: PORTABLE CHEST 1 VIEW COMPARISON:  Feb 11, 2023 FINDINGS: The cardiomediastinal silhouette is stable. No pneumothorax. Increasing bilateral diffuse pulmonary opacities, largely interstitial, suggesting pulmonary edema. No other acute abnormalities.  IMPRESSION: Pulmonary edema. Electronically Signed   By: Gerome Sam III M.D.   On: 02/13/2023 13:07   ECHOCARDIOGRAM COMPLETE  Result Date: 02/13/2023    ECHOCARDIOGRAM REPORT   Patient Name:   Victor Phillips Date of Exam: 02/13/2023 Medical Rec #:  [id number]        Height:       68.0 in Accession #:    [id number]       Weight:       171.0 lb Date of Birth:  18-Nov-1955       BSA:          1.912 m Patient Age:    66 years         BP:           166/88 mmHg Patient Gender: M                HR:           86 bpm. Exam Location:  [facility] Procedure: 2D Echo, Cardiac Doppler, Color Doppler and Strain Analysis Indications:    Chest Pain R07.9  History:        Patient has no prior history of Echocardiogram examinations.                 Signs/Symptoms:Chest Pain; Risk Factors:Hypertension, Diabetes,                 Dyslipidemia and Non-Smoker.  Sonographer:    Dondra Prader RVT RCS Referring Phys: [id number] CADENCE H FURTH IMPRESSIONS  1. Left ventricular ejection fraction, by estimation, is 60 to 65%. The left ventricle has normal function. The left ventricle has no regional wall motion abnormalities. Left ventricular diastolic parameters are consistent with Grade II diastolic  dysfunction (pseudonormalization). Elevated left ventricular end-diastolic pressure. The average left ventricular global longitudinal strain is -19.2 %. The global longitudinal strain is normal.  2. Right ventricular systolic function is normal. The right ventricular size is normal. There is normal pulmonary artery systolic pressure.  3. The mitral valve is normal in structure. Trivial mitral valve regurgitation. No evidence of mitral stenosis.  4. The aortic valve is tricuspid. Aortic valve regurgitation is not visualized. No aortic stenosis is present.  5. There is dilatation of the ascending aorta, measuring 38 mm.  6. The inferior vena cava is dilated in size with >50% respiratory variability, suggesting right atrial pressure of 8 mmHg. FINDINGS  Left Ventricle: Left ventricular ejection fraction, by estimation, is 60 to 65%. The left ventricle has normal function. The left ventricle has no regional wall motion abnormalities. The average left ventricular global longitudinal strain is -19.2 %. The global longitudinal strain is normal. The left ventricular internal cavity size was normal in size. There is no left ventricular hypertrophy. Left ventricular diastolic parameters are consistent with Grade II diastolic dysfunction (pseudonormalization). Elevated left ventricular end-diastolic pressure. Right Ventricle: The right ventricular size is normal. No increase in right ventricular wall thickness. Right ventricular systolic function is normal. There is normal pulmonary artery systolic pressure. The tricuspid regurgitant velocity is 2.23 m/s, and  with an assumed right atrial pressure of 8 mmHg, the estimated right ventricular systolic pressure is 27.9 mmHg. Left Atrium: Left atrial size was normal in size. Right Atrium: Right atrial size was normal in size. Pericardium: There is no evidence of pericardial effusion. Mitral Valve: The mitral valve is normal in structure. Trivial mitral valve regurgitation. No  evidence of mitral valve stenosis. Tricuspid Valve: The tricuspid valve is normal in  structure. Tricuspid valve regurgitation is trivial. No evidence of tricuspid stenosis. Aortic Valve: The aortic valve is tricuspid. Aortic valve regurgitation is not visualized. No aortic stenosis is present. Aortic valve mean gradient measures 5.0 mmHg. Aortic valve peak gradient measures 9.6 mmHg. Aortic valve area, by VTI measures 2.58 cm. Pulmonic Valve: The pulmonic valve was normal in structure. Pulmonic valve regurgitation is trivial. No evidence of pulmonic stenosis. Aorta: The aortic root is normal in size and structure. There is dilatation of the ascending aorta, measuring 38 mm. Venous: The inferior vena cava is dilated in size with greater than 50% respiratory variability, suggesting right atrial pressure of 8 mmHg. IAS/Shunts: No atrial level shunt detected by color flow Doppler.  LEFT VENTRICLE PLAX 2D LVIDd:         5.10 cm   Diastology LVIDs:         3.40 cm   LV e' medial:    6.12 cm/s LV PW:         1.10 cm   LV E/e' medial:  19.0 LV IVS:        1.00 cm   LV e' lateral:   9.28 cm/s LVOT diam:     2.10 cm   LV E/e' lateral: 12.5 LV SV:         84 LV SV Index:   44        2D Longitudinal Strain LVOT Area:     3.46 cm  2D Strain GLS (A2C):   -16.9 %                          2D Strain GLS (A3C):   -18.2 %                          2D Strain GLS (A4C):   -22.6 %                          2D Strain GLS Avg:     -19.2 % RIGHT VENTRICLE RV Basal diam:  3.30 cm RV S prime:     10.80 cm/s TAPSE (M-mode): 2.0 cm LEFT ATRIUM             Index        RIGHT ATRIUM           Index LA diam:        4.20 cm 2.20 cm/m   RA Area:     12.80 cm LA Vol (A2C):   47.8 ml 25.00 ml/m  RA Volume:   26.60 ml  13.91 ml/m LA Vol (A4C):   40.2 ml 21.05 ml/m LA Biplane Vol: 54.0 ml 28.24 ml/m  AORTIC VALVE                     PULMONIC VALVE AV Area (Vmax):    2.64 cm      PV Vmax:          1.41 m/s AV Area (Vmean):   2.58 cm      PV Peak  grad:     8.0 mmHg AV Area (VTI):     2.58 cm      PR End Diast Vel: 5.11 msec AV Vmax:           155.00 cm/s AV Vmean:          107.000 cm/s AV VTI:  0.326 m AV Peak Grad:      9.6 mmHg AV Mean Grad:      5.0 mmHg LVOT Vmax:         118.00 cm/s LVOT Vmean:        79.700 cm/s LVOT VTI:          0.243 m LVOT/AV VTI ratio: 0.75  AORTA Ao Root diam: 3.20 cm Ao Asc diam:  3.80 cm MITRAL VALVE                TRICUSPID VALVE MV Area (PHT): 3.85 cm     TR Peak grad:   19.9 mmHg MV Decel Time: 197 msec     TR Vmax:        223.00 cm/s MV E velocity: 116.00 cm/s MV A velocity: 71.10 cm/s   SHUNTS MV E/A ratio:  1.63         Systemic VTI:  0.24 m                             Systemic Diam: 2.10 cm Chilton Si MD Electronically signed by Chilton Si MD Signature Date/Time: 02/13/2023/10:42:45 AM    Final    US RENAL  Result Date: 02/11/2023 CLINICAL DATA:  Acute kidney insufficiency EXAM: RENAL / URINARY TRACT ULTRASOUND COMPLETE COMPARISON:  CT earlier 02/11/2019 FINDINGS: Right Kidney: Renal measurements: 11.7 x 6.8 x 6.3 cm = volume: 262.4 mL. Mild parenchymal atrophy prominent renal sinus fat. No perinephric fluid. Left Kidney: Renal measurements: 12.0 x 7.1 x 5.6 cm = volume: 250 mL. Mild parenchymal atrophy with prominent renal sinus fat. No collecting system dilatation or perinephric fluid. Bladder: Distended with fluid.  Preserved contour.  Ureteral jets are seen. Other: None. IMPRESSION: Mild bilateral renal atrophy. No collecting system dilatation. Please correlate with separate contrast CT from same day Electronically Signed   By: Karen Kays M.D.   On: 02/11/2023 15:20   CT ABDOMEN PELVIS W CONTRAST  Result Date: 02/11/2023 CLINICAL DATA:  Chest pain and shortness of breath for a few weeks. Nonlocalized pain, anemia and weight loss. EXAM: CT ANGIOGRAPHY CHEST CT ABDOMEN AND PELVIS WITH CONTRAST TECHNIQUE: Multidetector CT imaging of the chest was performed using the standard protocol  during bolus administration of intravenous contrast. Multiplanar CT image reconstructions and MIPs were obtained to evaluate the vascular anatomy. Multidetector CT imaging of the abdomen and pelvis was performed using the standard protocol during bolus administration of intravenous contrast. RADIATION DOSE REDUCTION: This exam was performed according to the departmental dose-optimization program which includes automated exposure control, adjustment of the mA and/or kV according to patient size and/or use of iterative reconstruction technique. CONTRAST:  80mL OMNIPAQUE IOHEXOL 350 MG/ML SOLN COMPARISON:  Chest x-ray earlier 02/11/2023 and older FINDINGS: CTA CHEST FINDINGS Cardiovascular: No segmental or larger pulmonary embolism identified. Heart is mildly enlarged. Small pericardial effusion. The thoracic aorta has a normal course and caliber with mild atherosclerotic calcified plaque. Mediastinum/Nodes: No specific abnormal lymph node enlargement identified in the axillary regions. There are several prominent small mediastinal and hilar lymph nodes. More numerous than usually seen but predominantly less than 1 cm in size in short axis and not pathologic by size criteria. Small hiatal hernia. Preserved thyroid gland. Lungs/Pleura: Small right and tiny left pleural effusion. Diffuse patchy ground-glass opacity seen in the lungs with some areas of interstitial thickening. Please correlate for a component of edema. There is a consolidative opacity as well in  the right lower lobe. Possible separate infiltrate or pneumonia. Associated areas of bronchial wall thickening bilaterally. Greatest in the right lower lobe. Musculoskeletal: Mild degenerative changes along the spine. Review of the MIP images confirms the above findings. CT ABDOMEN and PELVIS FINDINGS Hepatobiliary: No focal liver abnormality is seen. No gallstones, gallbladder wall thickening, or biliary dilatation. Pancreas: Unremarkable. No pancreatic ductal  dilatation or surrounding inflammatory changes. Spleen: Normal in size without focal abnormality. Adrenals/Urinary Tract: Adrenal glands are unremarkable. Kidneys are normal, without renal calculi, focal lesion, or hydronephrosis. Bladder is unremarkable. Stomach/Bowel: Large amount of colonic stool. The bowel is nondilated. No oral contrast. Vascular/Lymphatic: Aortic atherosclerosis. No enlarged abdominal or pelvic lymph nodes. Reproductive: Prostate is unremarkable. Other: Evaluation limited by motion throughout the examination. No free air or free fluid. Musculoskeletal: Scattered degenerative changes of the spine and pelvis. Review of the MIP images confirms the above findings. IMPRESSION: Evaluation limited by motion particularly of the abdomen and pelvis. No segmental or larger pulmonary embolism. Small right and tiny left effusion with consolidative opacity along the right lower lobe. Possible pneumonia. Recommend follow-up. Separate areas of patchy ground-glass interstitial septal thickening. Please correlate for any other etiology including a component of edema. Large amount of colonic stool. No bowel obstruction, free air or free fluid. Electronically Signed   By: Karen Kays M.D.   On: 02/11/2023 10:39   CT Angio Chest PE W/Cm &/Or Wo Cm  Result Date: 02/11/2023 CLINICAL DATA:  Chest pain and shortness of breath for a few weeks. Nonlocalized pain, anemia and weight loss. EXAM: CT ANGIOGRAPHY CHEST CT ABDOMEN AND PELVIS WITH CONTRAST TECHNIQUE: Multidetector CT imaging of the chest was performed using the standard protocol during bolus administration of intravenous contrast. Multiplanar CT image reconstructions and MIPs were obtained to evaluate the vascular anatomy. Multidetector CT imaging of the abdomen and pelvis was performed using the standard protocol during bolus administration of intravenous contrast. RADIATION DOSE REDUCTION: This exam was performed according to the departmental  dose-optimization program which includes automated exposure control, adjustment of the mA and/or kV according to patient size and/or use of iterative reconstruction technique. CONTRAST:  80mL OMNIPAQUE IOHEXOL 350 MG/ML SOLN COMPARISON:  Chest x-ray earlier 02/11/2023 and older FINDINGS: CTA CHEST FINDINGS Cardiovascular: No segmental or larger pulmonary embolism identified. Heart is mildly enlarged. Small pericardial effusion. The thoracic aorta has a normal course and caliber with mild atherosclerotic calcified plaque. Mediastinum/Nodes: No specific abnormal lymph node enlargement identified in the axillary regions. There are several prominent small mediastinal and hilar lymph nodes. More numerous than usually seen but predominantly less than 1 cm in size in short axis and not pathologic by size criteria. Small hiatal hernia. Preserved thyroid gland. Lungs/Pleura: Small right and tiny left pleural effusion. Diffuse patchy ground-glass opacity seen in the lungs with some areas of interstitial thickening. Please correlate for a component of edema. There is a consolidative opacity as well in the right lower lobe. Possible separate infiltrate or pneumonia. Associated areas of bronchial wall thickening bilaterally. Greatest in the right lower lobe. Musculoskeletal: Mild degenerative changes along the spine. Review of the MIP images confirms the above findings. CT ABDOMEN and PELVIS FINDINGS Hepatobiliary: No focal liver abnormality is seen. No gallstones, gallbladder wall thickening, or biliary dilatation. Pancreas: Unremarkable. No pancreatic ductal dilatation or surrounding inflammatory changes. Spleen: Normal in size without focal abnormality. Adrenals/Urinary Tract: Adrenal glands are unremarkable. Kidneys are normal, without renal calculi, focal lesion, or hydronephrosis. Bladder is unremarkable. Stomach/Bowel: Large amount of colonic  stool. The bowel is nondilated. No oral contrast. Vascular/Lymphatic: Aortic  atherosclerosis. No enlarged abdominal or pelvic lymph nodes. Reproductive: Prostate is unremarkable. Other: Evaluation limited by motion throughout the examination. No free air or free fluid. Musculoskeletal: Scattered degenerative changes of the spine and pelvis. Review of the MIP images confirms the above findings. IMPRESSION: Evaluation limited by motion particularly of the abdomen and pelvis. No segmental or larger pulmonary embolism. Small right and tiny left effusion with consolidative opacity along the right lower lobe. Possible pneumonia. Recommend follow-up. Separate areas of patchy ground-glass interstitial septal thickening. Please correlate for any other etiology including a component of edema. Large amount of colonic stool. No bowel obstruction, free air or free fluid. Electronically Signed   By: Karen Kays M.D.   On: 02/11/2023 10:39   DG Chest 2 View  Result Date: 02/11/2023 CLINICAL DATA:  Chest pain and shortness of breath. EXAM: CHEST - 2 VIEW COMPARISON:  10/27/2017. FINDINGS: Low lung volumes accentuate the pulmonary vasculature and cardiomediastinal silhouette. No consolidation or pulmonary edema. No pleural effusion or pneumothorax. Visualized bones and upper abdomen are unremarkable. IMPRESSION: No evidence of acute cardiopulmonary disease. Electronically Signed   By: Orvan Falconer M.D.   On: 02/11/2023 08:57    Microbiology: Results for orders placed or performed during the hospital encounter of 02/11/23  Blood culture (routine x 2)     Status: None (Preliminary result)   Collection Time: 02/11/23 10:53 AM   Specimen: BLOOD  Result Value Ref Range Status   Specimen Description BLOOD RIGHT FOREARM  Final   Special Requests   Final    BOTTLES DRAWN AEROBIC AND ANAEROBIC Blood Culture results may not be optimal due to an excessive volume of blood received in culture bottles   Culture   Final    NO GROWTH 3 DAYS Performed at [facility], [address],  [place], [place] [address]    Report Status PENDING  Incomplete  Blood culture (routine x 2)     Status: None (Preliminary result)   Collection Time: 02/11/23 11:03 AM   Specimen: BLOOD  Result Value Ref Range Status   Specimen Description BLOOD RIGHT ARM  Final   Special Requests   Final    BOTTLES DRAWN AEROBIC AND ANAEROBIC Blood Culture results may not be optimal due to an excessive volume of blood received in culture bottles   Culture   Final    NO GROWTH 3 DAYS Performed at [facility], [address] [place], [place] [address]    Report Status PENDING  Incomplete  Resp panel by RT-PCR (RSV, Flu A&B, Covid) Anterior Nasal Swab     Status: None   Collection Time: 02/11/23 11:54 AM   Specimen: Anterior Nasal Swab  Result Value Ref Range Status   SARS Coronavirus 2 by RT PCR NEGATIVE NEGATIVE Final    Comment: (NOTE) SARS-CoV-2 target nucleic acids are NOT DETECTED.  The SARS-CoV-2 RNA is generally detectable in upper respiratory specimens during the acute phase of infection. The lowest concentration of SARS-CoV-2 viral copies this assay can detect is 138 copies/mL. A negative result does not preclude SARS-Cov-2 infection and should not be used as the sole basis for treatment or other patient management decisions. A negative result may occur with  improper specimen collection/handling, submission of specimen other than nasopharyngeal swab, presence of viral mutation(s) within the areas targeted by this assay, and inadequate number of viral copies(<138 copies/mL). A negative result must be combined with clinical observations,  patient history, and epidemiological information. The expected result is Negative.  Fact Sheet for Patients:  BloggerCourse.com  Fact Sheet for Healthcare Providers:  SeriousBroker.it  This test is no t yet approved or cleared by the [place] FDA and  has been authorized for detection and/or  diagnosis of SARS-CoV-2 by FDA under an Emergency Use Authorization (EUA). This EUA will remain  in effect (meaning this test can be used) for the duration of the COVID-19 declaration under Section 564(b)(1) of the Act, 21 U.S.C.section 360bbb-3(b)(1), unless the authorization is terminated  or revoked sooner.       Influenza A by PCR NEGATIVE NEGATIVE Final   Influenza B by PCR NEGATIVE NEGATIVE Final    Comment: (NOTE) The Xpert Xpress SARS-CoV-2/FLU/RSV plus assay is intended as an aid in the diagnosis of influenza from Nasopharyngeal swab specimens and should not be used as a sole basis for treatment. Nasal washings and aspirates are unacceptable for Xpert Xpress SARS-CoV-2/FLU/RSV testing.  Fact Sheet for Patients: BloggerCourse.com  Fact Sheet for Healthcare Providers: SeriousBroker.it  This test is not yet approved or cleared by the [place] FDA and has been authorized for detection and/or diagnosis of SARS-CoV-2 by FDA under an Emergency Use Authorization (EUA). This EUA will remain in effect (meaning this test can be used) for the duration of the COVID-19 declaration under Section 564(b)(1) of the Act, 21 U.S.C. section 360bbb-3(b)(1), unless the authorization is terminated or revoked.     Resp Syncytial Virus by PCR NEGATIVE NEGATIVE Final    Comment: (NOTE) Fact Sheet for Patients: BloggerCourse.com  Fact Sheet for Healthcare Providers: SeriousBroker.it  This test is not yet approved or cleared by the [place] FDA and has been authorized for detection and/or diagnosis of SARS-CoV-2 by FDA under an Emergency Use Authorization (EUA). This EUA will remain in effect (meaning this test can be used) for the duration of the COVID-19 declaration under Section 564(b)(1) of the Act, 21 U.S.C. section 360bbb-3(b)(1), unless the authorization is terminated  or revoked.  Performed at [facility], [address] [place], [place] [address]   Respiratory (~20 pathogens) panel by PCR     Status: None   Collection Time: 02/12/23  4:19 PM   Specimen: Nasopharyngeal Swab; Respiratory  Result Value Ref Range Status   Adenovirus NOT DETECTED NOT DETECTED Final   Coronavirus 229E NOT DETECTED NOT DETECTED Final    Comment: (NOTE) The Coronavirus on the Respiratory Panel, DOES NOT test for the novel  Coronavirus (2019 nCoV)    Coronavirus HKU1 NOT DETECTED NOT DETECTED Final   Coronavirus NL63 NOT DETECTED NOT DETECTED Final   Coronavirus OC43 NOT DETECTED NOT DETECTED Final   Metapneumovirus NOT DETECTED NOT DETECTED Final   Rhinovirus / Enterovirus NOT DETECTED NOT DETECTED Final   Influenza A NOT DETECTED NOT DETECTED Final   Influenza B NOT DETECTED NOT DETECTED Final   Parainfluenza Virus 1 NOT DETECTED NOT DETECTED Final   Parainfluenza Virus 2 NOT DETECTED NOT DETECTED Final   Parainfluenza Virus 3 NOT DETECTED NOT DETECTED Final   Parainfluenza Virus 4 NOT DETECTED NOT DETECTED Final   Respiratory Syncytial Virus NOT DETECTED NOT DETECTED Final   Bordetella pertussis NOT DETECTED NOT DETECTED Final   Bordetella Parapertussis NOT DETECTED NOT DETECTED Final   Chlamydophila pneumoniae NOT DETECTED NOT DETECTED Final   Mycoplasma pneumoniae NOT DETECTED NOT DETECTED Final    Comment: Performed at [facility], [address], [place], [place] [address]  Labs: CBC: Recent Labs  Lab 02/11/23 0905 02/12/23 0326 02/13/23 0329 02/14/23 0359  WBC 5.8 6.0 5.2 4.3  NEUTROABS 4.3  --   --   --   HGB 6.8* 7.6* 8.1* 8.0*  HCT 21.2* 23.2* 23.9* 23.4*  MCV 106.0* 100.0 98.0 98.7  PLT 165 157 177 172   Basic Metabolic Panel: Recent Labs  Lab 02/11/23 0905 02/12/23 0326 02/13/23 0329  NA 133* 135 134*  K 4.6 4.4 4.5  CL 104 104 100  CO2 22 25 25   GLUCOSE 181* 71 90  BUN 35* 34* 33*  CREATININE 1.43* 1.31*  1.41*  CALCIUM 8.2* 8.6* 8.7*   Liver Function Tests: Recent Labs  Lab 02/11/23 0905  AST 22  ALT 23  ALKPHOS 70  BILITOT 0.5  PROT 9.5*  ALBUMIN 2.5*   CBG: Recent Labs  Lab 02/13/23 2201 02/14/23 0737 02/14/23 1202 02/14/23 1238 02/14/23 1456  GLUCAP 70 74 46* 97 69*    Discharge time spent: greater than 30 minutes.  This record has been created using Conservation officer, historic buildings. Errors have been sought and corrected,but may not always be located. Such creation errors do not reflect on the standard of care.   Signed: Arnetha Courser, MD [facility] 02/14/2023

## 2023-02-14 NOTE — Transfer of Care (Signed)
Immediate Anesthesia Transfer of Care Note  Patient: Victor Phillips  Procedure(s) Performed: COLONOSCOPY  Patient Location: Endoscopy Unit  Anesthesia Type:General  Level of Consciousness: drowsy  Airway & Oxygen Therapy: Patient Spontanous Breathing  Post-op Assessment: Report given to RN and Post -op Vital signs reviewed and stable  Post vital signs: Reviewed and stable  Last Vitals:  Vitals Value Taken Time  BP 111/61 02/14/23 1610  Temp    Pulse 70 02/14/23 1610  Resp 15 02/14/23 1609  SpO2 91 % 02/14/23 1610  Vitals shown include unvalidated device data.  Last Pain:  Vitals:   02/14/23 1501  TempSrc: Temporal  PainSc:          Complications: No notable events documented.

## 2023-02-14 NOTE — Progress Notes (Signed)
PULMONOLOGY         Date: 02/14/2023,   MRN# [id number] MORGAN MCLANE 1956-01-20     AdmissionWeight: 77.6 kg                 CurrentWeight: 77.6 kg  Referring provider: Dr. Nelson Chimes   CHIEF COMPLAINT:   Acute on chronic hypoxemic respiratory failure   HISTORY OF PRESENT ILLNESS   This is a pleasant 67 year old male with a history of diabetes and essential hypertension who came in with complaints of chest pain and fatigue, his fatigue progressively gotten worse he saw his primary care doctor was noted to have blood work with significant anemia previously was around 13 and now was around 7 and actually less than 7 at 6.8 on admission.  He does have an AKI mild BNP elevation troponin was flat fluid lactate was flat, chest x-ray did show a right sided consolidated infiltrate negative for PE.  Initially was hypoxemic and 87% improved with supplemental oxygen does not have a history of COPD or smoking.  He was treated for community-acquired pneumonia with Zithromax Rocephin.  So far workup is negative, COVID, RSV and flu is negative.  Legionella and sputum cultures are in process.  02/13/23- patient had improvement in hb overnight. Had lasix challenge overnight with good output, CXR repeating this am. Reports less dyspnea. S/p EGD and colonoscopy today  02/14/23- patient now on room air.  PCCM will sign off at this time and am available if needed on outpatient.   PAST MEDICAL HISTORY   Past Medical History:  Diagnosis Date   Diabetes mellitus without complication (HCC)    Hypertension      SURGICAL HISTORY   Past Surgical History:  Procedure Laterality Date   COLONOSCOPY N/A 02/13/2023   Procedure: COLONOSCOPY;  Surgeon: Toledo, Boykin Nearing, MD;  Location: [facility] ENDOSCOPY;  Service: Gastroenterology;  Laterality: N/A;   ESOPHAGOGASTRODUODENOSCOPY N/A 02/13/2023   Procedure: ESOPHAGOGASTRODUODENOSCOPY (EGD);  Surgeon: Toledo, Boykin Nearing, MD;  Location: [facility] ENDOSCOPY;  Service:  Gastroenterology;  Laterality: N/A;     FAMILY HISTORY   History reviewed. No pertinent family history.   SOCIAL HISTORY   Social History   Tobacco Use   Smoking status: Never   Smokeless tobacco: Never  Substance Use Topics   Alcohol use: No   Drug use: No     MEDICATIONS    Home Medication:    Current Medication:  Current Facility-Administered Medications:    acetaminophen (TYLENOL) tablet 650 mg, 650 mg, Oral, Q6H PRN **OR** acetaminophen (TYLENOL) suppository 650 mg, 650 mg, Rectal, Q6H PRN, Arnetha Courser, MD   atorvastatin (LIPITOR) tablet 10 mg, 10 mg, Oral, Daily, Amin, Tilman Neat, MD, 10 mg at 02/14/23 0925   azithromycin (ZITHROMAX) 500 mg in sodium chloride 0.9 % 250 mL IVPB, 500 mg, Intravenous, Q24H, Amin, Sumayya, MD, Last Rate: 250 mL/hr at 02/13/23 1200, 500 mg at 02/13/23 1200   cefTRIAXone (ROCEPHIN) 2 g in sodium chloride 0.9 % 100 mL IVPB, 2 g, Intravenous, Q24H, Amin, Sumayya, MD, Last Rate: 200 mL/hr at 02/13/23 1125, 2 g at 02/13/23 1125   Fe Fum-Vit C-Vit B12-FA (TRIGELS-F FORTE) capsule 1 capsule, 1 capsule, Oral, QPC breakfast, Arnetha Courser, MD, 1 capsule at 02/14/23 0925   furosemide (LASIX) injection 60 mg, 60 mg, Intravenous, Daily, Karna Christmas, Zyere Jiminez, MD, 60 mg at 02/14/23 0925   hydrALAZINE (APRESOLINE) tablet 25 mg, 25 mg, Oral, Q8H PRN, Arnetha Courser, MD   insulin aspart (novoLOG) injection 0-15 Units,  0-15 Units, Subcutaneous, TID WC, Arnetha Courser, MD, 2 Units at 02/13/23 1102   insulin aspart (novoLOG) injection 0-5 Units, 0-5 Units, Subcutaneous, QHS, Arnetha Courser, MD   lactated ringers infusion, , Intravenous, Continuous, Amin, Tilman Neat, MD, Last Rate: 100 mL/hr at 02/14/23 0615, New Bag at 02/14/23 0615   multivitamin with minerals tablet 1 tablet, 1 tablet, Oral, Daily, Arnetha Courser, MD, 1 tablet at 02/14/23 0925   pantoprazole (PROTONIX) injection 40 mg, 40 mg, Intravenous, Q12H, Arnetha Courser, MD, 40 mg at 02/14/23 1610    ALLERGIES    Patient has no known allergies.     REVIEW OF SYSTEMS    Review of Systems:  Gen:  Denies  fever, sweats, chills weigh loss  HEENT: Denies blurred vision, double vision, ear pain, eye pain, hearing loss, nose bleeds, sore throat Cardiac:  No dizziness, chest pain or heaviness, chest tightness,edema Resp:   reports dyspnea chronically  Gi: Denies swallowing difficulty, stomach pain, nausea or vomiting, diarrhea, constipation, bowel incontinence Gu:  Denies bladder incontinence, burning urine Ext:   Denies Joint pain, stiffness or swelling Skin: Denies  skin rash, easy bruising or bleeding or hives Endoc:  Denies polyuria, polydipsia , polyphagia or weight change Psych:   Denies depression, insomnia or hallucinations   Other:  All other systems negative   VS: BP (!) 159/77   Pulse 76   Temp 97.9 F (36.6 C)   Resp 14   Ht 5\' 8"  (1.727 m)   Wt 77.6 kg   SpO2 97%   BMI 26.00 kg/m      PHYSICAL EXAM    GENERAL:NAD, no fevers, chills, no weakness no fatigue HEAD: Normocephalic, atraumatic.  EYES: Pupils equal, round, reactive to light. Extraocular muscles intact. No scleral icterus.  MOUTH: Moist mucosal membrane. Dentition intact. No abscess noted.  EAR, NOSE, THROAT: Clear without exudates. No external lesions.  NECK: Supple. No thyromegaly. No nodules. No JVD.  PULMONARY: decreased breath sounds with mild rhonchi worse at bases bilaterally.  CARDIOVASCULAR: S1 and S2. Regular rate and rhythm. No murmurs, rubs, or gallops. No edema. Pedal pulses 2+ bilaterally.  GASTROINTESTINAL: Soft, nontender, nondistended. No masses. Positive bowel sounds. No hepatosplenomegaly.  MUSCULOSKELETAL: No swelling, clubbing, or edema. Range of motion full in all extremities.  NEUROLOGIC: Cranial nerves II through XII are intact. No gross focal neurological deficits. Sensation intact. Reflexes intact.  SKIN: No ulceration, lesions, rashes, or cyanosis. Skin warm and dry. Turgor intact.   PSYCHIATRIC: Mood, affect within normal limits. The patient is awake, alert and oriented x 3. Insight, judgment intact.       IMAGING     ASSESSMENT/PLAN   Acute hypoxemic respiratory failure - present on admission  - COVID19 negative   - supplemental O2 during my evaluation 2L/min  -patient may have had alveolar hemorrhage due to reduced Hb from appx 13 to 7 unclear chronicity -will attempt trial of lasix today due to GGO bilaterally and pleural effusion on right suggestive of fluid overload in and around lungs -Respiratory viral panel -serum fungitell -legionella ab -strep pneumoniae ur AG -Histoplasma Ur Ag -sputum resp cultures -AFB sputum expectorated specimen -sputum cytology  -reviewed pertinent imaging with patient today - ESR/CRP -PT/OT for d/c planning  -please encourage patient to use incentive spirometer few times each hour while hospitalized.                Thank you for allowing me to participate in the care of this patient.   Patient/Family are satisfied  with care plan and all questions have been answered.    Provider disclosure: Patient with at least one acute or chronic illness or injury that poses a threat to life or bodily function and is being managed actively during this encounter.  All of the below services have been performed independently by signing provider:  review of prior documentation from internal and or external health records.  Review of previous and current lab results.  Interview and comprehensive assessment during patient visit today. Review of current and previous chest radiographs/CT scans. Discussion of management and test interpretation with health care team and patient/family.   This document was prepared using Dragon voice recognition software and may include unintentional dictation errors.     Vida Rigger, M.D.  Division of Pulmonary & Critical Care Medicine

## 2023-02-14 NOTE — TOC Progression Note (Signed)
Transition of Care [facility]) - Progression Note    Patient Details  Name: Victor Phillips MRN: [id number] Date of Birth: Sep 01, 1956  Transition of Care [facility]) CM/SW Contact  Victor Reddish, RN Phone Number: 02/14/2023, 11:30 AM  Clinical Narrative:   Chart reviewed.  I have spoken with [name].  He reports that prior to admission he lived at home with his wife.  He reports that he did not have to use any assistive devices prior to admission.  He reports that his PCP is Victor Phillips at the [facility]  [name] reports that medications are affordable.  He use [facility] for prescription medications.  Patient reports that he has 2 wheeled rolling walker.    I have spoken with [name] about the need for Home Health PT.  He reports that he is walking ok and does not feel like he needs home health PT.  He is planning on returning back to work by the end of the week if he is able to.    Pt. Denies any other TOC needs.  TOC will continue to follow.           Expected Discharge Plan and Services                                               Social Determinants of Health (SDOH) Interventions SDOH Screenings   Food Insecurity: No Food Insecurity (02/11/2023)  Housing: Low Risk  (02/11/2023)  Transportation Needs: No Transportation Needs (02/11/2023)  Utilities: Not At Risk (02/11/2023)  Tobacco Use: Low Risk  (02/14/2023)    Readmission Risk Interventions     No data to display

## 2023-02-14 NOTE — Progress Notes (Signed)
SLP Cancellation Note  Patient Details Name: Victor Phillips MRN: [id number] DOB: [birth year]   Cancelled treatment:       Reason Eval/Treat Not Completed: Medical issues which prohibited therapy. Pt NPO for colonoscopy. Will continue efforts as appropriate.  Victor Phillips, M.S., CCC-SLP Speech-Language Pathologist [facility] [phone] [name])  [name] 02/14/2023, 8:50 AM

## 2023-02-14 NOTE — Progress Notes (Signed)
  Hypoglycemic Event  CBG: 46  Treatment: D50 25 mL (12.5 gm)  Symptoms: None  Follow-up CBG: Time 1238 CBG Result:97  Possible Reasons for Event: Inadequate meal intake  Comments/MD notified:Dr Amin   no new orders    [employer]

## 2023-02-14 NOTE — Anesthesia Preprocedure Evaluation (Signed)
Anesthesia Evaluation  Patient identified by MRN, date of birth, ID band Patient awake    Reviewed: Allergy & Precautions, NPO status , Patient's Chart, lab work & pertinent test results  History of Anesthesia Complications Negative for: history of anesthetic complications  Airway Mallampati: III  TM Distance: >3 FB Neck ROM: full    Dental  (+) Dental Advidsory Given, Poor Dentition, Chipped   Pulmonary neg shortness of breath, pneumonia, neg COPD, neg recent URI   Pulmonary exam normal        Cardiovascular hypertension, On Medications (-) angina (-) CAD, (-) Past MI and (-) Cardiac Stents negative cardio ROS Normal cardiovascular exam(-) dysrhythmias (-) Valvular Problems/Murmurs  IMPRESSIONS     1. Left ventricular ejection fraction, by estimation, is 60 to 65%. The  left ventricle has normal function. The left ventricle has no regional  wall motion abnormalities. Left ventricular diastolic parameters are  consistent with Grade II diastolic  dysfunction (pseudonormalization). Elevated left ventricular end-diastolic  pressure. The average left ventricular global longitudinal strain is -19.2  %. The global longitudinal strain is normal.   2. Right ventricular systolic function is normal. The right ventricular  size is normal. There is normal pulmonary artery systolic pressure.   3. The mitral valve is normal in structure. Trivial mitral valve  regurgitation. No evidence of mitral stenosis.   4. The aortic valve is tricuspid. Aortic valve regurgitation is not  visualized. No aortic stenosis is present.   5. There is dilatation of the ascending aorta, measuring 38 mm.   6. The inferior vena cava is dilated in size with >50% respiratory  variability, suggesting right atrial pressure of 8 mmHg.     Neuro/Psych negative neurological ROS  negative psych ROS   GI/Hepatic negative GI ROS, Neg liver ROS,,,  Endo/Other   diabetes    Renal/GU Renal disease (AKI)  negative genitourinary   Musculoskeletal   Abdominal   Peds  Hematology  (+) Blood dyscrasia, anemia   Anesthesia Other Findings Past Medical History: No date: Diabetes mellitus without complication (HCC) No date: Hypertension  History reviewed. No pertinent surgical history.  BMI    Body Mass Index: 26.00 kg/m      Reproductive/Obstetrics negative OB ROS                             Anesthesia Physical Anesthesia Plan  ASA: 3  Anesthesia Plan: General   Post-op Pain Management:    Induction: Intravenous  PONV Risk Score and Plan: Propofol infusion and TIVA  Airway Management Planned: Natural Airway and Nasal Cannula  Additional Equipment:   Intra-op Plan:   Post-operative Plan:   Informed Consent: I have reviewed the patients History and Physical, chart, labs and discussed the procedure including the risks, benefits and alternatives for the proposed anesthesia with the patient or authorized representative who has indicated his/her understanding and acceptance.     Dental Advisory Given  Plan Discussed with: Anesthesiologist, CRNA and Surgeon  Anesthesia Plan Comments: (Patient consented for risks of anesthesia including but not limited to:  - adverse reactions to medications - risk of airway placement if required - damage to eyes, teeth, lips or other oral mucosa - nerve damage due to positioning  - sore throat or hoarseness - Damage to heart, brain, nerves, lungs, other parts of body or loss of life  Patient voiced understanding.)        Anesthesia Quick Evaluation

## 2023-02-14 NOTE — Interval H&P Note (Signed)
History and Physical Interval Note:  02/14/2023 3:44 PM  Victor Phillips  has presented today for surgery, with the diagnosis of anemia secondary to blood loss, hemoccult positive stool.  The various methods of treatment have been discussed with the patient and family. After consideration of risks, benefits and other options for treatment, the patient has consented to  Procedure(s): COLONOSCOPY (N/A) as a surgical intervention.  The patient's history has been reviewed, patient examined, no change in status, stable for surgery.  I have reviewed the patient's chart and labs.  Questions were answered to the patient's satisfaction.     [place], [place]

## 2023-02-15 ENCOUNTER — Encounter: Payer: Self-pay | Admitting: Internal Medicine

## 2023-02-15 LAB — CULTURE, BLOOD (ROUTINE X 2)

## 2023-02-15 LAB — C-REACTIVE PROTEIN

## 2023-02-16 LAB — CULTURE, BLOOD (ROUTINE X 2): Culture: NO GROWTH

## 2023-03-07 NOTE — Anesthesia Postprocedure Evaluation (Signed)
Anesthesia Post Note  Patient: Victor Phillips  Procedure(s) Performed: COLONOSCOPY  Patient location during evaluation: Endoscopy Anesthesia Type: General Level of consciousness: awake and alert Pain management: pain level controlled Vital Signs Assessment: post-procedure vital signs reviewed and stable Respiratory status: spontaneous breathing, nonlabored ventilation, respiratory function stable and patient connected to nasal cannula oxygen Cardiovascular status: blood pressure returned to baseline and stable Postop Assessment: no apparent nausea or vomiting Anesthetic complications: no   No notable events documented.   Last Vitals:  Vitals:   02/14/23 1628 02/14/23 1634  BP: (!) 98/52 (!) 113/59  Pulse: 72 73  Resp: 16 17  Temp:    SpO2: 92% 97%    Last Pain:  Vitals:   02/14/23 1634  TempSrc:   PainSc: 0-No pain                 Lenard Simmer

## 2023-03-10 ENCOUNTER — Other Ambulatory Visit: Payer: Self-pay | Admitting: Internal Medicine

## 2023-03-10 DIAGNOSIS — G4452 New daily persistent headache (NDPH): Secondary | ICD-10-CM

## 2023-03-10 DIAGNOSIS — D500 Iron deficiency anemia secondary to blood loss (chronic): Secondary | ICD-10-CM

## 2023-03-11 ENCOUNTER — Ambulatory Visit
Admission: RE | Admit: 2023-03-11 | Discharge: 2023-03-11 | Disposition: A | Discharge status: Home / Self Care | Payer: Medicare HMO | Source: Ambulatory Visit | Attending: Internal Medicine | Admitting: Internal Medicine

## 2023-03-11 DIAGNOSIS — G4452 New daily persistent headache (NDPH): Secondary | ICD-10-CM | POA: Diagnosis present

## 2023-03-11 DIAGNOSIS — D500 Iron deficiency anemia secondary to blood loss (chronic): Secondary | ICD-10-CM | POA: Insufficient documentation

## 2023-03-14 ENCOUNTER — Inpatient Hospital Stay: Payer: Medicare HMO | Attending: Oncology | Admitting: Oncology

## 2023-03-14 ENCOUNTER — Inpatient Hospital Stay: Payer: Medicare HMO

## 2023-03-14 ENCOUNTER — Encounter: Payer: Self-pay | Admitting: Oncology

## 2023-03-14 VITALS — BP 157/75 | HR 75 | Temp 97.2°F | Resp 18 | Wt 168.6 lb

## 2023-03-14 DIAGNOSIS — Z8701 Personal history of pneumonia (recurrent): Secondary | ICD-10-CM | POA: Insufficient documentation

## 2023-03-14 DIAGNOSIS — D539 Nutritional anemia, unspecified: Secondary | ICD-10-CM | POA: Diagnosis present

## 2023-03-14 DIAGNOSIS — T451X5A Adverse effect of antineoplastic and immunosuppressive drugs, initial encounter: Secondary | ICD-10-CM | POA: Insufficient documentation

## 2023-03-14 DIAGNOSIS — K2970 Gastritis, unspecified, without bleeding: Secondary | ICD-10-CM | POA: Insufficient documentation

## 2023-03-14 DIAGNOSIS — D649 Anemia, unspecified: Secondary | ICD-10-CM | POA: Insufficient documentation

## 2023-03-14 DIAGNOSIS — D6481 Anemia due to antineoplastic chemotherapy: Secondary | ICD-10-CM | POA: Insufficient documentation

## 2023-03-14 LAB — RETIC PANEL
Immature Retic Fract: 19 % — ABNORMAL HIGH (ref 2.3–15.9)
RBC.: 2.16 MIL/uL — ABNORMAL LOW (ref 4.22–5.81)
Retic Count, Absolute: 112.8 10*3/uL (ref 19.0–186.0)
Retic Ct Pct: 5.2 % — ABNORMAL HIGH (ref 0.4–3.1)
Reticulocyte Hemoglobin: 36.3 pg (ref >27.9)

## 2023-03-14 LAB — CBC WITH DIFFERENTIAL/PLATELET
Abs Immature Granulocytes: 0.11 10*3/uL — ABNORMAL HIGH (ref 0.00–0.07)
Basophils Absolute: 0 10*3/uL (ref 0.0–0.1)
Basophils Relative: 0 %
Eosinophils Absolute: 0.1 10*3/uL (ref 0.0–0.5)
Eosinophils Relative: 2 %
HCT: 22.9 % — ABNORMAL LOW (ref 39.0–52.0)
Hemoglobin: 7.4 g/dL — ABNORMAL LOW (ref 13.0–17.0)
Immature Granulocytes: 2 %
Lymphocytes Relative: 23 %
Lymphs Abs: 1.3 10*3/uL (ref 0.7–4.0)
MCH: 33.9 pg (ref 26.0–34.0)
MCHC: 32.3 g/dL (ref 30.0–36.0)
MCV: 105 fL — ABNORMAL HIGH (ref 80.0–100.0)
Monocytes Absolute: 0.6 10*3/uL (ref 0.1–1.0)
Monocytes Relative: 11 %
Neutro Abs: 3.4 10*3/uL (ref 1.7–7.7)
Neutrophils Relative %: 62 %
Platelets: 172 10*3/uL (ref 150–400)
RBC: 2.18 MIL/uL — ABNORMAL LOW (ref 4.22–5.81)
RDW: 16.6 % — ABNORMAL HIGH (ref 11.5–15.5)
WBC: 5.5 10*3/uL (ref 4.0–10.5)
nRBC: 0 % (ref 0.0–0.2)

## 2023-03-14 LAB — LACTATE DEHYDROGENASE: LDH: 112 U/L (ref 98–192)

## 2023-03-14 LAB — IRON AND TIBC
Iron: 100 ug/dL (ref 45–182)
Saturation Ratios: 37 % (ref 17.9–39.5)
TIBC: 272 ug/dL (ref 250–450)
UIBC: 172 ug/dL

## 2023-03-14 LAB — FOLATE: Folate: >40 ng/mL (ref >5.9)

## 2023-03-14 LAB — FERRITIN: Ferritin: 249 ng/mL (ref 24–336)

## 2023-03-14 NOTE — Progress Notes (Signed)
Hematology/Oncology Consult note [phone] [phone]      Patient Care Team: Danella Penton, MD as PCP - General (Internal Medicine)   REFERRING PROVIDER: Danella Penton, MD  CHIEF COMPLAINTS/REASON FOR VISIT:  Anemia  ASSESSMENT & PLAN:   Macrocytic anemia Previous labs were reviewed and discussed with patient. He has had iron panel done during his recent admission which is not typical for iron deficiency. I recommend to check CBC, smear, B12, folate, reticulocyte panel, multiple myeloma panel, light chain ratio, LDH, haptoglobin.  If above workup is negative, recommend bone marrow biopsy.  Orders Placed This Encounter  Procedures   Ferritin    Standing Status:   Future    Number of Occurrences:   1    Standing Expiration Date:   09/14/2023   Iron and TIBC    Standing Status:   Future    Number of Occurrences:   1    Standing Expiration Date:   03/13/2024   Vitamin B12    Standing Status:   Future    Number of Occurrences:   1    Standing Expiration Date:   03/13/2024   Folate    Standing Status:   Future    Number of Occurrences:   1    Standing Expiration Date:   03/13/2024   CBC with Differential/Platelet    Standing Status:   Future    Number of Occurrences:   1    Standing Expiration Date:   03/13/2024   Retic Panel    Standing Status:   Future    Number of Occurrences:   1    Standing Expiration Date:   03/13/2024   Multiple Myeloma Panel (SPEP&IFE w/QIG)    Standing Status:   Future    Number of Occurrences:   1    Standing Expiration Date:   03/13/2024   Kappa/lambda light chains    Standing Status:   Future    Number of Occurrences:   1    Standing Expiration Date:   03/13/2024   Lactate dehydrogenase    Standing Status:   Future    Number of Occurrences:   1    Standing Expiration Date:   03/13/2024   Haptoglobin    Standing Status:   Future    Number of Occurrences:   1    Standing Expiration Date:   03/13/2024   Follow-up to be  determined. All questions were answered. The patient knows to call the clinic with any problems, questions or concerns.  [name], MD, PhD [facility] Hematology Oncology 03/14/2023     HISTORY OF PRESENTING ILLNESS:  Victor Phillips is a  67 y.o.  male with PMH listed below who was referred to me for anemia  02/11/2023 - 02/14/2023 recent hospitalization due to pneumonia, respiratory failure.  He was found to have a hemoglobin decreased at 6.8, status post PRBC transfusion during admission.  EGD showed gastritis.  Colonoscopy was not remarkable. 02/11/2023 TIBC 221 ferritin 107, iron saturation 16. Patient is currently taking fusion plus Vitamin B12 level in the 300s. His echo showed grade 2 diastolic CHF.  He denies recent chest pain on exertion, shortness of breath on minimal exertion, pre-syncopal episodes, or palpitations He had not noticed any recent bleeding such as epistaxis, hematuria or hematochezia.  He denies over the counter NSAID ingestion. He is not  on antiplatelets agents. He denies any pica and eats a variety of diet.   MEDICAL HISTORY:  Past Medical History:  Diagnosis Date   Diabetes mellitus without complication (HCC)    Hypertension     SURGICAL HISTORY: Past Surgical History:  Procedure Laterality Date   COLONOSCOPY N/A 02/13/2023   Procedure: COLONOSCOPY;  Surgeon: Toledo, Boykin Nearing, MD;  Location: [facility] ENDOSCOPY;  Service: Gastroenterology;  Laterality: N/A;   COLONOSCOPY N/A 02/14/2023   Procedure: COLONOSCOPY;  Surgeon: Toledo, Boykin Nearing, MD;  Location: [facility] ENDOSCOPY;  Service: Gastroenterology;  Laterality: N/A;   ESOPHAGOGASTRODUODENOSCOPY N/A 02/13/2023   Procedure: ESOPHAGOGASTRODUODENOSCOPY (EGD);  Surgeon: Toledo, Boykin Nearing, MD;  Location: [facility] ENDOSCOPY;  Service: Gastroenterology;  Laterality: N/A;    SOCIAL HISTORY: Social History   Socioeconomic History   Marital status: Married    Spouse name: Not on file   Number of children: Not on file    Years of education: Not on file   Highest education level: Not on file  Occupational History   Not on file  Tobacco Use   Smoking status: Never   Smokeless tobacco: Never  Vaping Use   Vaping Use: Never used  Substance and Sexual Activity   Alcohol use: Not Currently    Comment: quit approx 2001   Drug use: No   Sexual activity: Not on file  Other Topics Concern   Not on file  Social History Narrative   Not on file   Social Determinants of Health   Financial Resource Strain: Not on file  Food Insecurity: No Food Insecurity (03/14/2023)   Hunger Vital Sign    Worried About Running Out of Food in the Last Year: Never true    Ran Out of Food in the Last Year: Never true  Transportation Needs: No Transportation Needs (03/14/2023)   PRAPARE - Administrator, Civil Service (Medical): No    Lack of Transportation (Non-Medical): No  Physical Activity: Not on file  Stress: Not on file  Social Connections: Not on file  Intimate Partner Violence: Not At Risk (03/14/2023)   Humiliation, Afraid, Rape, and Kick questionnaire    Fear of Current or Ex-Partner: No    Emotionally Abused: No    Physically Abused: No    Sexually Abused: No    FAMILY HISTORY: Family History  Problem Relation Age of Onset   Diabetes Mother    Heart attack Father     ALLERGIES:  has No Known Allergies.  MEDICATIONS:  Current Outpatient Medications  Medication Sig Dispense Refill   acetaminophen (TYLENOL) 325 MG tablet Take 2 tablets (650 mg total) by mouth every 6 (six) hours as needed for mild pain (or Fever >/= 101). 90 tablet 1   atorvastatin (LIPITOR) 10 MG tablet Take 1 tablet by mouth daily.     Fe Fum-Vit C-Vit B12-FA (TRIGELS-F FORTE) CAPS capsule Take 1 capsule by mouth 2 (two) times daily. 60 capsule 3   Multiple Vitamin (MULTIVITAMIN WITH MINERALS) TABS tablet Take 1 tablet by mouth daily. 90 tablet 1   olmesartan-hydrochlorothiazide (BENICAR HCT) 20-12.5 MG tablet Take 1 tablet  by mouth daily.     No current facility-administered medications for this visit.    Review of Systems - Oncology  PHYSICAL EXAMINATION: Vitals:   03/14/23 1456  BP: (!) 157/75  Pulse: 75  Resp: 18  Temp: (!) 97.2 F (36.2 C)   Filed Weights   03/14/23 1456  Weight: 168 lb 9.6 oz (76.5 kg)    Physical Exam   LABORATORY DATA:  I have reviewed the data as listed  Latest Ref Rng & Units 03/14/2023    3:57 PM 02/14/2023    3:59 AM 02/13/2023    3:29 AM  CBC  WBC 4.0 - 10.5 K/uL 5.5  4.3  5.2   Hemoglobin 13.0 - 17.0 g/dL 7.4  8.0  8.1   Hematocrit 39.0 - 52.0 % 22.9  23.4  23.9   Platelets 150 - 400 K/uL 172  172  177       Latest Ref Rng & Units 02/13/2023    3:29 AM 02/12/2023    3:26 AM 02/11/2023    9:05 AM  CMP  Glucose 70 - 99 mg/dL 90  71  409   BUN 8 - 23 mg/dL 33  34  35   Creatinine 0.61 - 1.24 mg/dL 8.11  9.14  7.82   Sodium 135 - 145 mmol/L 134  135  133   Potassium 3.5 - 5.1 mmol/L 4.5  4.4  4.6   Chloride 98 - 111 mmol/L 100  104  104   CO2 22 - 32 mmol/L 25  25  22    Calcium 8.9 - 10.3 mg/dL 8.7  8.6  8.2   Total Protein 6.5 - 8.1 g/dL   9.5   Total Bilirubin 0.3 - 1.2 mg/dL   0.5   Alkaline Phos 38 - 126 U/L   70   AST 15 - 41 U/L   22   ALT 0 - 44 U/L   23    Lab Results  Component Value Date   IRON 100 03/14/2023   TIBC 272 03/14/2023   IRONPCTSAT 37 03/14/2023   FERRITIN 249 03/14/2023     RADIOGRAPHIC STUDIES: I have personally reviewed the radiological images as listed and agreed with the findings in the report. CT HEAD WO CONTRAST ( )  Result Date: 03/11/2023 CLINICAL DATA:  Headache EXAM: CT HEAD WITHOUT CONTRAST TECHNIQUE: Contiguous axial images were obtained from the base of the skull through the vertex without intravenous contrast. RADIATION DOSE REDUCTION: This exam was performed according to the departmental dose-optimization program which includes automated exposure control, adjustment of the mA and/or kV according to patient size  and/or use of iterative reconstruction technique. COMPARISON:  CT Head 01/25/18 FINDINGS: Brain: No evidence of acute infarction, hemorrhage, hydrocephalus, extra-axial collection or mass lesion/mass effect. Vascular: No hyperdense vessel or unexpected calcification. Skull: Normal. Negative for fracture or focal lesion. Sinuses/Orbits: No middle ear or mastoid effusion. Paranasal sinuses clear. Orbits are. Other: None. IMPRESSION: No acute intracranial abnormality. No specific etiology headaches identified. Electronically Signed   By: Lorenza Cambridge M.D.   On: 03/11/2023 15:24   DG Chest Port 1 View  Result Date: 02/13/2023 CLINICAL DATA:  Interstitial edema. EXAM: PORTABLE CHEST 1 VIEW COMPARISON:  Feb 11, 2023 FINDINGS: The cardiomediastinal silhouette is stable. No pneumothorax. Increasing bilateral diffuse pulmonary opacities, largely interstitial, suggesting pulmonary edema. No other acute abnormalities. IMPRESSION: Pulmonary edema. Electronically Signed   By: Gerome Sam III M.D.   On: 02/13/2023 13:07   ECHOCARDIOGRAM COMPLETE  Result Date: 02/13/2023    ECHOCARDIOGRAM REPORT   Patient Name:   JEMAR BUFFORD Strahm Date of Exam: 02/13/2023 Medical Rec #:  [id number]        Height:       68.0 in Accession #:    [id number]       Weight:       171.0 lb Date of Birth:  1956-04-09       BSA:  1.912 m Patient Age:    66 years         BP:           166/88 mmHg Patient Gender: M                HR:           86 bpm. Exam Location:  [facility] Procedure: 2D Echo, Cardiac Doppler, Color Doppler and Strain Analysis Indications:    Chest Pain R07.9  History:        Patient has no prior history of Echocardiogram examinations.                 Signs/Symptoms:Chest Pain; Risk Factors:Hypertension, Diabetes,                 Dyslipidemia and Non-Smoker.  Sonographer:    Dondra Prader RVT RCS Referring Phys: [id number] CADENCE H FURTH IMPRESSIONS  1. Left ventricular ejection fraction, by estimation, is 60 to 65%. The left  ventricle has normal function. The left ventricle has no regional wall motion abnormalities. Left ventricular diastolic parameters are consistent with Grade II diastolic dysfunction (pseudonormalization). Elevated left ventricular end-diastolic pressure. The average left ventricular global longitudinal strain is -19.2 %. The global longitudinal strain is normal.  2. Right ventricular systolic function is normal. The right ventricular size is normal. There is normal pulmonary artery systolic pressure.  3. The mitral valve is normal in structure. Trivial mitral valve regurgitation. No evidence of mitral stenosis.  4. The aortic valve is tricuspid. Aortic valve regurgitation is not visualized. No aortic stenosis is present.  5. There is dilatation of the ascending aorta, measuring 38 mm.  6. The inferior vena cava is dilated in size with >50% respiratory variability, suggesting right atrial pressure of 8 mmHg. FINDINGS  Left Ventricle: Left ventricular ejection fraction, by estimation, is 60 to 65%. The left ventricle has normal function. The left ventricle has no regional wall motion abnormalities. The average left ventricular global longitudinal strain is -19.2 %. The global longitudinal strain is normal. The left ventricular internal cavity size was normal in size. There is no left ventricular hypertrophy. Left ventricular diastolic parameters are consistent with Grade II diastolic dysfunction (pseudonormalization). Elevated left ventricular end-diastolic pressure. Right Ventricle: The right ventricular size is normal. No increase in right ventricular wall thickness. Right ventricular systolic function is normal. There is normal pulmonary artery systolic pressure. The tricuspid regurgitant velocity is 2.23 m/s, and  with an assumed right atrial pressure of 8 mmHg, the estimated right ventricular systolic pressure is 27.9 mmHg. Left Atrium: Left atrial size was normal in size. Right Atrium: Right atrial size was  normal in size. Pericardium: There is no evidence of pericardial effusion. Mitral Valve: The mitral valve is normal in structure. Trivial mitral valve regurgitation. No evidence of mitral valve stenosis. Tricuspid Valve: The tricuspid valve is normal in structure. Tricuspid valve regurgitation is trivial. No evidence of tricuspid stenosis. Aortic Valve: The aortic valve is tricuspid. Aortic valve regurgitation is not visualized. No aortic stenosis is present. Aortic valve mean gradient measures 5.0 mmHg. Aortic valve peak gradient measures 9.6 mmHg. Aortic valve area, by VTI measures 2.58 cm. Pulmonic Valve: The pulmonic valve was normal in structure. Pulmonic valve regurgitation is trivial. No evidence of pulmonic stenosis. Aorta: The aortic root is normal in size and structure. There is dilatation of the ascending aorta, measuring 38 mm. Venous: The inferior vena cava is dilated in size with greater than 50% respiratory variability, suggesting right  atrial pressure of 8 mmHg. IAS/Shunts: No atrial level shunt detected by color flow Doppler.  LEFT VENTRICLE PLAX 2D LVIDd:         5.10 cm   Diastology LVIDs:         3.40 cm   LV e' medial:    6.12 cm/s LV PW:         1.10 cm   LV E/e' medial:  19.0 LV IVS:        1.00 cm   LV e' lateral:   9.28 cm/s LVOT diam:     2.10 cm   LV E/e' lateral: 12.5 LV SV:         84 LV SV Index:   44        2D Longitudinal Strain LVOT Area:     3.46 cm  2D Strain GLS (A2C):   -16.9 %                          2D Strain GLS (A3C):   -18.2 %                          2D Strain GLS (A4C):   -22.6 %                          2D Strain GLS Avg:     -19.2 % RIGHT VENTRICLE RV Basal diam:  3.30 cm RV S prime:     10.80 cm/s TAPSE (M-mode): 2.0 cm LEFT ATRIUM             Index        RIGHT ATRIUM           Index LA diam:        4.20 cm 2.20 cm/m   RA Area:     12.80 cm LA Vol (A2C):   47.8 ml 25.00 ml/m  RA Volume:   26.60 ml  13.91 ml/m LA Vol (A4C):   40.2 ml 21.05 ml/m LA Biplane Vol:  54.0 ml 28.24 ml/m  AORTIC VALVE                     PULMONIC VALVE AV Area (Vmax):    2.64 cm      PV Vmax:          1.41 m/s AV Area (Vmean):   2.58 cm      PV Peak grad:     8.0 mmHg AV Area (VTI):     2.58 cm      PR End Diast Vel: 5.11 msec AV Vmax:           155.00 cm/s AV Vmean:          107.000 cm/s AV VTI:            0.326 m AV Peak Grad:      9.6 mmHg AV Mean Grad:      5.0 mmHg LVOT Vmax:         118.00 cm/s LVOT Vmean:        79.700 cm/s LVOT VTI:          0.243 m LVOT/AV VTI ratio: 0.75  AORTA Ao Root diam: 3.20 cm Ao Asc diam:  3.80 cm MITRAL VALVE                TRICUSPID VALVE MV Area (PHT): 3.85 cm  TR Peak grad:   19.9 mmHg MV Decel Time: 197 msec     TR Vmax:        223.00 cm/s MV E velocity: 116.00 cm/s MV A velocity: 71.10 cm/s   SHUNTS MV E/A ratio:  1.63         Systemic VTI:  0.24 m                             Systemic Diam: 2.10 cm Chilton Si MD Electronically signed by Chilton Si MD Signature Date/Time: 02/13/2023/10:42:45 AM    Final

## 2023-03-14 NOTE — Assessment & Plan Note (Signed)
Previous labs were reviewed and discussed with patient. He has had iron panel done during his recent admission which is not typical for iron deficiency. I recommend to check CBC, smear, B12, folate, reticulocyte panel, multiple myeloma panel, light chain ratio, LDH, haptoglobin.  If above workup is negative, recommend bone marrow biopsy.

## 2023-03-14 NOTE — Progress Notes (Signed)
Pt here to establish care.

## 2023-03-15 LAB — VITAMIN B12: Vitamin B-12: 449 pg/mL (ref 180–914)

## 2023-03-15 LAB — KAPPA/LAMBDA LIGHT CHAINS
Kappa free light chain: 9.5 mg/L (ref 3.3–19.4)
Kappa, lambda light chain ratio: 0.07 — ABNORMAL LOW (ref 0.26–1.65)
Lambda free light chains: 142.7 mg/L — ABNORMAL HIGH (ref 5.7–26.3)

## 2023-03-15 LAB — HAPTOGLOBIN: Haptoglobin: 61 mg/dL (ref 32–363)

## 2023-03-19 LAB — MULTIPLE MYELOMA PANEL, SERUM
Albumin SerPl Elph-Mcnc: 3.7 g/dL (ref 2.9–4.4)
Albumin/Glob SerPl: 0.6 — ABNORMAL LOW (ref 0.7–1.7)
Alpha 1: 0.2 g/dL (ref 0.0–0.4)
Alpha2 Glob SerPl Elph-Mcnc: 0.6 g/dL (ref 0.4–1.0)
B-Globulin SerPl Elph-Mcnc: 6.1 g/dL — ABNORMAL HIGH (ref 0.7–1.3)
Gamma Glob SerPl Elph-Mcnc: 0.2 g/dL — ABNORMAL LOW (ref 0.4–1.8)
Globulin, Total: 7.1 g/dL — ABNORMAL HIGH (ref 2.2–3.9)
IgA: >6400 mg/dL — ABNORMAL HIGH (ref 61–437)
IgG (Immunoglobin G), Serum: 276 mg/dL — ABNORMAL LOW (ref 603–1613)
IgM (Immunoglobulin M), Srm: 7 mg/dL — ABNORMAL LOW (ref 20–172)
M Protein SerPl Elph-Mcnc: 4.3 g/dL — ABNORMAL HIGH
Total Protein ELP: 10.8 g/dL — ABNORMAL HIGH (ref 6.0–8.5)

## 2023-03-24 ENCOUNTER — Other Ambulatory Visit: Payer: Self-pay

## 2023-03-24 ENCOUNTER — Telehealth: Payer: Self-pay

## 2023-03-24 ENCOUNTER — Other Ambulatory Visit: Payer: Self-pay | Admitting: Oncology

## 2023-03-24 ENCOUNTER — Inpatient Hospital Stay: Payer: Medicare HMO

## 2023-03-24 DIAGNOSIS — D649 Anemia, unspecified: Secondary | ICD-10-CM

## 2023-03-24 DIAGNOSIS — D539 Nutritional anemia, unspecified: Secondary | ICD-10-CM

## 2023-03-24 LAB — PREPARE RBC (CROSSMATCH)

## 2023-03-24 LAB — CBC WITH DIFFERENTIAL (CANCER CENTER ONLY)
Abs Immature Granulocytes: 0.08 10*3/uL — ABNORMAL HIGH (ref 0.00–0.07)
Basophils Absolute: 0 10*3/uL (ref 0.0–0.1)
Basophils Relative: 0 %
Eosinophils Absolute: 0.1 10*3/uL (ref 0.0–0.5)
Eosinophils Relative: 1 %
HCT: 22.8 % — ABNORMAL LOW (ref 39.0–52.0)
Hemoglobin: 7.5 g/dL — ABNORMAL LOW (ref 13.0–17.0)
Immature Granulocytes: 1 %
Lymphocytes Relative: 17 %
Lymphs Abs: 1.3 10*3/uL (ref 0.7–4.0)
MCH: 35.9 pg — ABNORMAL HIGH (ref 26.0–34.0)
MCHC: 32.9 g/dL (ref 30.0–36.0)
MCV: 109.1 fL — ABNORMAL HIGH (ref 80.0–100.0)
Monocytes Absolute: 0.4 10*3/uL (ref 0.1–1.0)
Monocytes Relative: 5 %
Neutro Abs: 5.9 10*3/uL (ref 1.7–7.7)
Neutrophils Relative %: 76 %
Platelet Count: 204 10*3/uL (ref 150–400)
RBC: 2.09 MIL/uL — ABNORMAL LOW (ref 4.22–5.81)
RDW: 17 % — ABNORMAL HIGH (ref 11.5–15.5)
WBC Count: 7.8 10*3/uL (ref 4.0–10.5)
nRBC: 0 % (ref 0.0–0.2)

## 2023-03-24 LAB — SAMPLE TO BLOOD BANK

## 2023-03-24 NOTE — Telephone Encounter (Signed)
Pt aware of all appts.... °

## 2023-03-24 NOTE — Telephone Encounter (Signed)
Called pt to follow up on symptoms, pt states that he's been having headaches and feeling a little weak, but denies any shortness of breath. Informed him of MD recommendation for blood transfusion, but he will  need to come in for cbc and hold tube. Pt declined coming in for labs/ blood transfusion as he was just here for labs at [facility] clinic.   Also informed him of skeletal survey, which is a walk in, and Bone marrow biopsy. He is agreeable to both.   Request for STAT Bmbx faxed to IR

## 2023-03-24 NOTE — Telephone Encounter (Signed)
-----   Message from [name] sent at 03/23/2023 10:00 PM EDT ----- His hemoglobin is low, please check if he is symptomatic, if yes please arrange him to get 1 unit of PRBC transfusion.  His work up showed possible plasma cell disorders. I recommend bone marrow biopsy urgent/ASAP, a skeletal survey, please arrange him to see me 1 week after BM biopsy.

## 2023-03-24 NOTE — Telephone Encounter (Signed)
Called pt back to let him know about biospy and he has decided to come in for labs this afternoon. He has been scheduled for blood tomorrow.   Pt scheduled for biopsy on 7/23 @ 9:30a and arrive 8:30a. He is aware of appt.   Morrie Sheldon, Please schedule pt on 8/1 for results lab (10:30)/ MD(11), poss blood on 8/2. Pt will pick up AVS when he is here for bloodwork.

## 2023-03-25 ENCOUNTER — Inpatient Hospital Stay: Payer: Medicare HMO

## 2023-03-25 DIAGNOSIS — D539 Nutritional anemia, unspecified: Secondary | ICD-10-CM | POA: Diagnosis not present

## 2023-03-25 DIAGNOSIS — D649 Anemia, unspecified: Secondary | ICD-10-CM

## 2023-03-25 LAB — BPAM RBC: Unit Type and Rh: 1700

## 2023-03-25 LAB — TYPE AND SCREEN
Antibody Screen: NEGATIVE
Unit division: 0

## 2023-03-25 MED ORDER — DIPHENHYDRAMINE HCL 25 MG PO CAPS
25.0000 mg | ORAL_CAPSULE | Freq: Once | ORAL | Status: AC
  Administered 2023-03-25: 25 mg via ORAL
  Filled 2023-03-25: qty 1

## 2023-03-25 MED ORDER — ACETAMINOPHEN 325 MG PO TABS
650.0000 mg | ORAL_TABLET | Freq: Once | ORAL | Status: AC
  Administered 2023-03-25: 650 mg via ORAL
  Filled 2023-03-25: qty 2

## 2023-03-25 MED ORDER — SODIUM CHLORIDE 0.9% IV SOLUTION
250.0000 mL | Freq: Once | INTRAVENOUS | Status: AC
  Administered 2023-03-25: 250 mL via INTRAVENOUS
  Filled 2023-03-25: qty 250

## 2023-03-26 LAB — TYPE AND SCREEN: ABO/RH(D): B POS

## 2023-03-26 LAB — BPAM RBC: ISSUE DATE / TIME: 202407120840

## 2023-03-27 LAB — BPAM RBC: Blood Product Expiration Date: 202408182359

## 2023-03-27 LAB — TYPE AND SCREEN

## 2023-03-31 ENCOUNTER — Other Ambulatory Visit: Payer: Self-pay | Admitting: Specialist

## 2023-03-31 DIAGNOSIS — R918 Other nonspecific abnormal finding of lung field: Secondary | ICD-10-CM

## 2023-03-31 DIAGNOSIS — R0602 Shortness of breath: Secondary | ICD-10-CM

## 2023-04-04 ENCOUNTER — Other Ambulatory Visit: Payer: Self-pay | Admitting: Radiology

## 2023-04-04 DIAGNOSIS — Z01812 Encounter for preprocedural laboratory examination: Secondary | ICD-10-CM

## 2023-04-05 ENCOUNTER — Other Ambulatory Visit: Payer: Self-pay

## 2023-04-05 ENCOUNTER — Ambulatory Visit
Admission: RE | Admit: 2023-04-05 | Discharge: 2023-04-05 | Disposition: A | Discharge status: Home / Self Care | Payer: Medicare HMO | Source: Ambulatory Visit | Attending: Oncology | Admitting: Oncology

## 2023-04-05 DIAGNOSIS — C9000 Multiple myeloma not having achieved remission: Secondary | ICD-10-CM | POA: Insufficient documentation

## 2023-04-05 DIAGNOSIS — D539 Nutritional anemia, unspecified: Secondary | ICD-10-CM | POA: Insufficient documentation

## 2023-04-05 DIAGNOSIS — Z01812 Encounter for preprocedural laboratory examination: Secondary | ICD-10-CM

## 2023-04-05 DIAGNOSIS — Z1379 Encounter for other screening for genetic and chromosomal anomalies: Secondary | ICD-10-CM | POA: Diagnosis not present

## 2023-04-05 DIAGNOSIS — D649 Anemia, unspecified: Secondary | ICD-10-CM

## 2023-04-05 LAB — CBC WITH DIFFERENTIAL/PLATELET
Abs Immature Granulocytes: 0.12 10*3/uL — ABNORMAL HIGH (ref 0.00–0.07)
Basophils Absolute: 0 10*3/uL (ref 0.0–0.1)
Basophils Relative: 0 %
Eosinophils Absolute: 0.1 10*3/uL (ref 0.0–0.5)
Eosinophils Relative: 3 %
HCT: 26.5 % — ABNORMAL LOW (ref 39.0–52.0)
Hemoglobin: 8.9 g/dL — ABNORMAL LOW (ref 13.0–17.0)
Immature Granulocytes: 2 %
Lymphocytes Relative: 23 %
Lymphs Abs: 1.3 10*3/uL (ref 0.7–4.0)
MCH: 33.6 pg (ref 26.0–34.0)
MCHC: 33.6 g/dL (ref 30.0–36.0)
MCV: 100 fL (ref 80.0–100.0)
Monocytes Absolute: 0.4 10*3/uL (ref 0.1–1.0)
Monocytes Relative: 8 %
Neutro Abs: 3.7 10*3/uL (ref 1.7–7.7)
Neutrophils Relative %: 64 %
Platelets: 161 10*3/uL (ref 150–400)
RBC: 2.65 MIL/uL — ABNORMAL LOW (ref 4.22–5.81)
RDW: 17.2 % — ABNORMAL HIGH (ref 11.5–15.5)
WBC: 5.7 10*3/uL (ref 4.0–10.5)
nRBC: 0 % (ref 0.0–0.2)

## 2023-04-05 LAB — GLUCOSE, CAPILLARY: Glucose-Capillary: 146 mg/dL — ABNORMAL HIGH (ref 70–99)

## 2023-04-05 MED ORDER — HEPARIN SOD (PORK) LOCK FLUSH 100 UNIT/ML IV SOLN: 500.0000 [IU] | Freq: Every day | INTRAVENOUS | Status: DC | PRN

## 2023-04-05 MED ORDER — HEPARIN SOD (PORK) LOCK FLUSH 100 UNIT/ML IV SOLN
250.0000 [IU] | INTRAVENOUS | Status: DC | PRN
  Filled 2023-04-05: qty 3

## 2023-04-05 MED ORDER — LIDOCAINE 1 % OPTIME INJ - NO CHARGE
10.0000 mL | Freq: Once | INTRAMUSCULAR | Status: AC
  Administered 2023-04-05: 10 mL
  Filled 2023-04-05: qty 10

## 2023-04-05 MED ORDER — HEPARIN SOD (PORK) LOCK FLUSH 100 UNIT/ML IV SOLN
INTRAVENOUS | Status: AC
  Filled 2023-04-05: qty 5

## 2023-04-05 MED ORDER — FENTANYL CITRATE (PF) 100 MCG/2ML IJ SOLN
INTRAMUSCULAR | Status: AC | PRN
  Administered 2023-04-05: 50 ug via INTRAVENOUS

## 2023-04-05 MED ORDER — SODIUM CHLORIDE 0.9% FLUSH: 10.0000 mL | INTRAVENOUS | Status: DC | PRN

## 2023-04-05 MED ORDER — SODIUM CHLORIDE 0.9% FLUSH: 3.0000 mL | INTRAVENOUS | Status: DC | PRN

## 2023-04-05 MED ORDER — FENTANYL CITRATE (PF) 100 MCG/2ML IJ SOLN
INTRAMUSCULAR | Status: AC
  Filled 2023-04-05: qty 2

## 2023-04-05 MED ORDER — SODIUM CHLORIDE 0.9 % IV SOLN
INTRAVENOUS | Status: DC
  Administered 2023-04-05: 10 mL/h via INTRAVENOUS

## 2023-04-05 MED ORDER — MIDAZOLAM HCL 2 MG/2ML IJ SOLN
INTRAMUSCULAR | Status: AC
  Filled 2023-04-05: qty 4

## 2023-04-05 MED ORDER — MIDAZOLAM HCL 2 MG/2ML IJ SOLN
INTRAMUSCULAR | Status: AC | PRN
  Administered 2023-04-05: 1 mg via INTRAVENOUS

## 2023-04-05 NOTE — Procedures (Signed)
Interventional Radiology Procedure Note  Procedure: CT guided bone marrow aspiration and biopsy  Complications: None  EBL: < 10 mL  Findings: Aspirate and core biopsy performed of bone marrow in right iliac bone.  Plan: Bedrest supine x 1 hrs  Glenn T. Yamagata, M.D Pager:  [phone]   

## 2023-04-05 NOTE — H&P (Signed)
Chief Complaint: Patient was seen in consultation today for bone marrow biopsy at the request of Yu,Zhou  Referring Physician(s): Yu,Zhou  Patient Status: [facility] - Out-pt  History of Present Illness: Victor Phillips is a 67 y.o. male with a history of macrocytic anemia and elevated lambda free light chains requiring bone marrow biopsy for further hematologic work up. He has chronic fatigue. Denies pain, dyspnea.  Past Medical History:  Diagnosis Date   Diabetes mellitus without complication (HCC)    Hypertension     Past Surgical History:  Procedure Laterality Date   COLONOSCOPY N/A 02/13/2023   Procedure: COLONOSCOPY;  Surgeon: Toledo, Boykin Nearing, MD;  Location: [facility] ENDOSCOPY;  Service: Gastroenterology;  Laterality: N/A;   COLONOSCOPY N/A 02/14/2023   Procedure: COLONOSCOPY;  Surgeon: Toledo, Boykin Nearing, MD;  Location: [facility] ENDOSCOPY;  Service: Gastroenterology;  Laterality: N/A;   ESOPHAGOGASTRODUODENOSCOPY N/A 02/13/2023   Procedure: ESOPHAGOGASTRODUODENOSCOPY (EGD);  Surgeon: Toledo, Boykin Nearing, MD;  Location: [facility] ENDOSCOPY;  Service: Gastroenterology;  Laterality: N/A;    Allergies: Patient has no known allergies.  Medications: Prior to Admission medications   Medication Sig Start Date End Date Taking? Authorizing Provider  acetaminophen (TYLENOL) 325 MG tablet Take 2 tablets (650 mg total) by mouth every 6 (six) hours as needed for mild pain (or Fever >/= 101). 02/14/23  Yes Arnetha Courser, MD  atorvastatin (LIPITOR) 10 MG tablet Take 1 tablet by mouth daily. 11/23/18  Yes [provider]  Multiple Vitamin (MULTIVITAMIN WITH MINERALS) TABS tablet Take 1 tablet by mouth daily. 02/15/23  Yes Arnetha Courser, MD  olmesartan-hydrochlorothiazide (BENICAR HCT) 20-12.5 MG tablet Take 1 tablet by mouth daily. 02/03/23 02/03/24 Yes [provider]  Fe Fum-Vit C-Vit B12-FA (TRIGELS-F FORTE) CAPS capsule Take 1 capsule by mouth 2 (two) times daily. Patient not taking:  Reported on 04/05/2023 02/14/23   Arnetha Courser, MD     Family History  Problem Relation Age of Onset   Diabetes Mother    Heart attack Father     Social History   Socioeconomic History   Marital status: Married    Spouse name: Not on file   Number of children: Not on file   Years of education: Not on file   Highest education level: Not on file  Occupational History   Not on file  Tobacco Use   Smoking status: Never   Smokeless tobacco: Never  Vaping Use   Vaping status: Never Used  Substance and Sexual Activity   Alcohol use: Not Currently    Comment: quit approx 2001   Drug use: No   Sexual activity: Not on file  Other Topics Concern   Not on file  Social History Narrative   Not on file   Social Determinants of Health   Financial Resource Strain: Low Risk  (02/03/2023)   Received from [facility], [facility]   Overall Financial Resource Strain (CARDIA)    Difficulty of Paying Living Expenses: Not very hard  Food Insecurity: No Food Insecurity (03/14/2023)   Hunger Vital Sign    Worried About Running Out of Food in the Last Year: Never true    Ran Out of Food in the Last Year: Never true  Transportation Needs: No Transportation Needs (03/14/2023)   PRAPARE - Administrator, Civil Service (Medical): No    Lack of Transportation (Non-Medical): No  Physical Activity: Sufficiently Active (09/24/2020)   Received from [facility], [facility]  Exercise Vital Sign    Days of Exercise per Week: 6 days    Minutes of Exercise per Session: 30 min  Stress: No Stress Concern Present (09/24/2020)   Received from [facility], [facility]   [employer] of Occupational Health - Occupational Stress Questionnaire    Feeling of Stress : Not at all  Social Connections: Socially Integrated (09/24/2020)   Received from [facility], [facility]   Social Connection and Isolation Panel [NHANES]    Frequency of Communication with Friends and Family: More than three times a week    Frequency of Social Gatherings with Friends and Family: Once a week    Attends Religious Services: More than 4 times per year    Active Member of Clubs or Organizations: Yes    Attends Engineer, structural: More than 4 times per year    Marital Status: Married    ECOG Status: 1 - Symptomatic but completely ambulatory  Review of Systems: A 12 point ROS discussed and pertinent positives are indicated in the HPI above.  All other systems are negative.  Review of Systems  Constitutional:  Positive for fatigue. Negative for chills and fever.  Respiratory: Negative.    Cardiovascular: Negative.   Gastrointestinal: Negative.   Genitourinary: Negative.   Musculoskeletal: Negative.   Neurological: Negative.     Vital Signs: BP (!) 190/92   Pulse 66   Temp 97.9 F (36.6 C) (Oral)   Resp 20   Ht 5\' 7"  (1.702 m)   Wt 74.8 kg   SpO2 99%   BMI 25.84 kg/m    Physical Exam Vitals reviewed.  Constitutional:      General: He is not in acute distress.    Appearance: He is not toxic-appearing.  Cardiovascular:     Rate and Rhythm: Normal rate and regular rhythm.     Heart sounds: Normal heart sounds. No murmur heard.    No friction rub. No gallop.  Pulmonary:     Effort: Pulmonary effort is normal. No respiratory distress.     Breath sounds: Normal breath sounds. No stridor. No wheezing, rhonchi or rales.  Abdominal:     General: Bowel sounds are normal. There is no distension.     Palpations: Abdomen is soft. There is no mass.     Tenderness: There is no abdominal tenderness. There is no guarding or rebound.  Musculoskeletal:        General: No swelling.     Cervical back: Neck supple. No tenderness.  Lymphadenopathy:     Cervical: No cervical adenopathy.  Skin:    General: Skin is warm and dry.  Neurological:      General: No focal deficit present.     Mental Status: He is alert and oriented to person, place, and time.     Imaging: CT HEAD WO CONTRAST ( )  Result Date: 03/11/2023 CLINICAL DATA:  Headache EXAM: CT HEAD WITHOUT CONTRAST TECHNIQUE: Contiguous axial images were obtained from the base of the skull through the vertex without intravenous contrast. RADIATION DOSE REDUCTION: This exam was performed according to the departmental dose-optimization program which includes automated exposure control, adjustment of the mA and/or kV according to patient size and/or use of iterative reconstruction technique. COMPARISON:  CT Head 01/25/18 FINDINGS: Brain: No evidence of acute infarction, hemorrhage, hydrocephalus, extra-axial collection or mass lesion/mass effect. Vascular: No hyperdense vessel or unexpected calcification. Skull: Normal. Negative for fracture or focal lesion. Sinuses/Orbits:  No middle ear or mastoid effusion. Paranasal sinuses clear. Orbits are. Other: None. IMPRESSION: No acute intracranial abnormality. No specific etiology headaches identified. Electronically Signed   By: Lorenza Cambridge M.D.   On: 03/11/2023 15:24    Labs:  CBC: Recent Labs    02/14/23 0359 03/14/23 1557 03/24/23 1435 04/05/23 0833  WBC 4.3 5.5 7.8 5.7  HGB 8.0* 7.4* 7.5* 8.9*  HCT 23.4* 22.9* 22.8* 26.5*  PLT 172 172 204 161    COAGS: Recent Labs    02/11/23 0906  INR 1.3*  APTT 31    BMP: Recent Labs    02/11/23 0905 02/12/23 0326 02/13/23 0329  NA 133* 135 134*  K 4.6 4.4 4.5  CL 104 104 100  CO2 22 25 25   GLUCOSE 181* 71 90  BUN 35* 34* 33*  CALCIUM 8.2* 8.6* 8.7*  CREATININE 1.43* 1.31* 1.41*  GFRNONAA 54* >60 55*    LIVER FUNCTION TESTS: Recent Labs    02/11/23 0905  BILITOT 0.5  AST 22  ALT 23  ALKPHOS 70  PROT 9.5*  ALBUMIN 2.5*    Assessment and Plan:  For CT guided bone marrow aspirate and core biopsy today. Consent obtained.  Thank you for this interesting consult.   I greatly enjoyed meeting Victor Phillips and look forward to participating in their care.  A copy of this report was sent to the requesting provider on this date.  Electronically Signed: Reola Calkins, MD 04/05/2023, 9:42 AM    I spent a total of  15 Minutes  in face to face in clinical consultation, greater than 50% of which was counseling/coordinating care for bone marrow biopsy.

## 2023-04-07 LAB — SURGICAL PATHOLOGY

## 2023-04-12 ENCOUNTER — Encounter (HOSPITAL_COMMUNITY): Payer: Self-pay | Admitting: Oncology

## 2023-04-13 ENCOUNTER — Emergency Department
Admission: EM | Admit: 2023-04-13 | Discharge: 2023-04-13 | Discharge status: Against Medical Advice | Payer: Medicare HMO | Attending: Emergency Medicine | Admitting: Emergency Medicine

## 2023-04-13 ENCOUNTER — Other Ambulatory Visit: Payer: Self-pay

## 2023-04-13 DIAGNOSIS — Z5321 Procedure and treatment not carried out due to patient leaving prior to being seen by health care provider: Secondary | ICD-10-CM | POA: Insufficient documentation

## 2023-04-13 DIAGNOSIS — R109 Unspecified abdominal pain: Secondary | ICD-10-CM | POA: Diagnosis present

## 2023-04-13 LAB — COMPREHENSIVE METABOLIC PANEL
ALT: 58 U/L — ABNORMAL HIGH (ref 0–44)
AST: 38 U/L (ref 15–41)
Albumin: 2.6 g/dL — ABNORMAL LOW (ref 3.5–5.0)
Alkaline Phosphatase: 94 U/L (ref 38–126)
Anion gap: 5 (ref 5–15)
BUN: 43 mg/dL — ABNORMAL HIGH (ref 8–23)
CO2: 26 mmol/L (ref 22–32)
Calcium: 8.8 mg/dL — ABNORMAL LOW (ref 8.9–10.3)
Chloride: 100 mmol/L (ref 98–111)
Creatinine, Ser: 1.75 mg/dL — ABNORMAL HIGH (ref 0.61–1.24)
GFR, Estimated: 43 mL/min — ABNORMAL LOW (ref >60)
Glucose, Bld: 313 mg/dL — ABNORMAL HIGH (ref 70–99)
Potassium: 5.2 mmol/L — ABNORMAL HIGH (ref 3.5–5.1)
Sodium: 131 mmol/L — ABNORMAL LOW (ref 135–145)
Total Bilirubin: 0.4 mg/dL (ref 0.3–1.2)
Total Protein: 11.7 g/dL — ABNORMAL HIGH (ref 6.5–8.1)

## 2023-04-13 LAB — CBC
HCT: 24.6 % — ABNORMAL LOW (ref 39.0–52.0)
Hemoglobin: 8.1 g/dL — ABNORMAL LOW (ref 13.0–17.0)
MCH: 34 pg (ref 26.0–34.0)
MCHC: 32.9 g/dL (ref 30.0–36.0)
MCV: 103.4 fL — ABNORMAL HIGH (ref 80.0–100.0)
Platelets: 151 10*3/uL (ref 150–400)
RBC: 2.38 MIL/uL — ABNORMAL LOW (ref 4.22–5.81)
RDW: 17.5 % — ABNORMAL HIGH (ref 11.5–15.5)
WBC: 5.9 10*3/uL (ref 4.0–10.5)
nRBC: 0 % (ref 0.0–0.2)

## 2023-04-13 LAB — LIPASE, BLOOD: Lipase: 53 U/L — ABNORMAL HIGH (ref 11–51)

## 2023-04-13 NOTE — ED Notes (Signed)
Pt presents to first nurse desk stating he had just had a BM and "is no longer impacted so I am leaving". Pt encouraged to wait for eval by MD but states "my problem is finished I just had a huge BM so I am going to leave". Pt ambulatory with steady gait on D/C.

## 2023-04-13 NOTE — ED Triage Notes (Signed)
Pt states that he hasn't been able to have a bm for 4 days, states that he has tried, miralax, milk of magnesia, ex-lax without success, states that he is having left sided abd pain that radiates to his left flank

## 2023-04-14 ENCOUNTER — Other Ambulatory Visit: Payer: Self-pay

## 2023-04-14 ENCOUNTER — Ambulatory Visit
Admission: RE | Admit: 2023-04-14 | Discharge: 2023-04-14 | Disposition: A | Discharge status: Home / Self Care | Payer: Medicare HMO | Attending: Oncology | Admitting: Oncology

## 2023-04-14 ENCOUNTER — Encounter: Payer: Self-pay | Admitting: Oncology

## 2023-04-14 ENCOUNTER — Other Ambulatory Visit: Payer: Self-pay | Admitting: Oncology

## 2023-04-14 ENCOUNTER — Encounter (HOSPITAL_COMMUNITY): Payer: Self-pay | Admitting: Oncology

## 2023-04-14 ENCOUNTER — Inpatient Hospital Stay (HOSPITAL_BASED_OUTPATIENT_CLINIC_OR_DEPARTMENT_OTHER): Payer: Medicare HMO | Admitting: Oncology

## 2023-04-14 ENCOUNTER — Ambulatory Visit
Admission: RE | Admit: 2023-04-14 | Discharge: 2023-04-14 | Disposition: A | Discharge status: Home / Self Care | Payer: Medicare HMO | Source: Ambulatory Visit | Attending: Oncology | Admitting: Oncology

## 2023-04-14 ENCOUNTER — Inpatient Hospital Stay: Payer: Medicare HMO | Attending: Oncology

## 2023-04-14 ENCOUNTER — Telehealth: Payer: Self-pay

## 2023-04-14 VITALS — BP 165/96 | HR 83 | Temp 97.5°F | Resp 18 | Wt 170.9 lb

## 2023-04-14 DIAGNOSIS — D649 Anemia, unspecified: Secondary | ICD-10-CM

## 2023-04-14 DIAGNOSIS — C9000 Multiple myeloma not having achieved remission: Secondary | ICD-10-CM | POA: Diagnosis present

## 2023-04-14 DIAGNOSIS — Z7189 Other specified counseling: Secondary | ICD-10-CM | POA: Insufficient documentation

## 2023-04-14 DIAGNOSIS — D539 Nutritional anemia, unspecified: Secondary | ICD-10-CM | POA: Insufficient documentation

## 2023-04-14 DIAGNOSIS — N183 Chronic kidney disease, stage 3 unspecified: Secondary | ICD-10-CM | POA: Diagnosis not present

## 2023-04-14 DIAGNOSIS — N1832 Chronic kidney disease, stage 3b: Secondary | ICD-10-CM

## 2023-04-14 DIAGNOSIS — Z5112 Encounter for antineoplastic immunotherapy: Secondary | ICD-10-CM | POA: Diagnosis present

## 2023-04-14 DIAGNOSIS — E1122 Type 2 diabetes mellitus with diabetic chronic kidney disease: Secondary | ICD-10-CM | POA: Diagnosis not present

## 2023-04-14 LAB — CBC WITH DIFFERENTIAL (CANCER CENTER ONLY)
Abs Immature Granulocytes: 0.29 10*3/uL — ABNORMAL HIGH (ref 0.00–0.07)
Basophils Absolute: 0 10*3/uL (ref 0.0–0.1)
Basophils Relative: 1 %
Eosinophils Absolute: 0.1 10*3/uL (ref 0.0–0.5)
Eosinophils Relative: 1 %
HCT: 23.3 % — ABNORMAL LOW (ref 39.0–52.0)
Hemoglobin: 7.6 g/dL — ABNORMAL LOW (ref 13.0–17.0)
Immature Granulocytes: 5 %
Lymphocytes Relative: 19 %
Lymphs Abs: 1.2 10*3/uL (ref 0.7–4.0)
MCH: 33.6 pg (ref 26.0–34.0)
MCHC: 32.6 g/dL (ref 30.0–36.0)
MCV: 103.1 fL — ABNORMAL HIGH (ref 80.0–100.0)
Monocytes Absolute: 0.5 10*3/uL (ref 0.1–1.0)
Monocytes Relative: 9 %
Neutro Abs: 4 10*3/uL (ref 1.7–7.7)
Neutrophils Relative %: 65 %
Platelet Count: 145 10*3/uL — ABNORMAL LOW (ref 150–400)
RBC: 2.26 MIL/uL — ABNORMAL LOW (ref 4.22–5.81)
RDW: 17.4 % — ABNORMAL HIGH (ref 11.5–15.5)
WBC Count: 6.1 10*3/uL (ref 4.0–10.5)
nRBC: 0 % (ref 0.0–0.2)

## 2023-04-14 LAB — LACTATE DEHYDROGENASE: LDH: 110 U/L (ref 98–192)

## 2023-04-14 LAB — TYPE AND SCREEN
ABO/RH(D): B POS
Antibody Screen: NEGATIVE
Unit division: 0

## 2023-04-14 LAB — BPAM RBC
Blood Product Expiration Date: 202408312359
Unit Type and Rh: 7300

## 2023-04-14 LAB — SAMPLE TO BLOOD BANK

## 2023-04-14 LAB — PREPARE RBC (CROSSMATCH)

## 2023-04-14 MED ORDER — PROCHLORPERAZINE MALEATE 10 MG PO TABS: 10.0000 mg | ORAL_TABLET | Freq: Four times a day (QID) | ORAL | 1 refills | Status: DC | PRN

## 2023-04-14 MED ORDER — DEXAMETHASONE 4 MG PO TABS
ORAL_TABLET | ORAL | 5 refills | Status: DC
Start: 2023-04-14 — End: 2023-04-22

## 2023-04-14 MED ORDER — ACYCLOVIR 400 MG PO TABS
400.0000 mg | ORAL_TABLET | Freq: Two times a day (BID) | ORAL | 5 refills | Status: DC
Start: 2023-04-14 — End: 2024-01-09

## 2023-04-14 MED ORDER — ONDANSETRON HCL 8 MG PO TABS: 8.0000 mg | ORAL_TABLET | Freq: Three times a day (TID) | ORAL | 1 refills | Status: DC | PRN

## 2023-04-14 MED ORDER — LENALIDOMIDE 10 MG PO CAPS: 10.0000 mg | ORAL_CAPSULE | Freq: Every day | ORAL | 0 refills | Status: DC

## 2023-04-14 MED ORDER — ASPIRIN 81 MG PO TBEC
81.0000 mg | DELAYED_RELEASE_TABLET | Freq: Every day | ORAL | 3 refills | Status: DC
Start: 2023-04-14 — End: 2023-06-03

## 2023-04-14 MED ORDER — ASPIRIN 325 MG PO TABS
325.0000 mg | ORAL_TABLET | Freq: Every day | ORAL | 3 refills | Status: DC
Start: 2023-04-14 — End: 2023-04-14

## 2023-04-14 NOTE — Assessment & Plan Note (Addendum)
Bone marrow biopsy was reviewed. 65% plasma cell involvement, IgA lamda, M protein 4.3,  Diagnosis of multiple myeloma was discussed with patient.  Check beta 2 microglobulin, LDH.  Check skeletal survey, PET scan.  Chemotherapy education was provided.  We had discussed the composition of chemotherapy regimen, length of chemo cycle, duration of treatment and the time to assess response to treatment.  I explained to the patient the risks and benefits of chemotherapy Dara RVd including all but not limited to allergic reaction, blood clot, hair loss, mouth sore, nausea, vomiting, diarrhea, low blood counts, bleeding, neuropathy and risk of life threatening infection and even death, secondary malignancy etc.  Patient voices understanding and willing to proceed chemotherapy.   He has renal insufficiency, will start with dose reduced Revlimid 10mg  D1-14  Could consider one dose of Dexamethasone 20mg  x 1 to bridge him to start treatment- he has DM, recently come off metformin, will discuss with pcp for DM regimen.  # Chemotherapy education; Antiemetics-Zofran and Compazine; Acyclovir and Aspirin Rx  sent to pharmacy  Supportive care measures are necessary for patient well-being and will be provided as necessary. We spent sufficient time to discuss many aspect of care, questions were answered to patient's satisfaction.

## 2023-04-14 NOTE — Assessment & Plan Note (Signed)
Possibly due to myeloma.  Encourage oral hydration and avoid nephrotoxins.

## 2023-04-14 NOTE — Assessment & Plan Note (Signed)
Recommend 1 unit of PRBC transfusion.

## 2023-04-14 NOTE — Assessment & Plan Note (Signed)
He is off metformin.  I have sent message to his PCP, he will need to be started on DM regimen. Myeloma treatment involves steroid use, he will benefit from tightening glycemic control.

## 2023-04-14 NOTE — Telephone Encounter (Signed)
Spoke to [place] at [facility] bone marrow path to addon congo Red staining to bm Bx specimen.

## 2023-04-14 NOTE — Assessment & Plan Note (Signed)
Discussed with patient. He understands that his condition is not curable, but treatment.

## 2023-04-14 NOTE — Progress Notes (Signed)
Pt here for biopsy results

## 2023-04-14 NOTE — Patient Instructions (Signed)
Skeletal survey/ bone survey- this scan is a walk-in and no appointment is needed. You may go to the medical mall at your own convenience to have this scan done.

## 2023-04-14 NOTE — Progress Notes (Signed)
START ON PATHWAY REGIMEN - Multiple Myeloma and Other Plasma Cell Dyscrasias   DaraVRd (Daratumumab SUBQ + Bortezomib SUBQ D1,4,8,11 + Lenalidomide PO D1-14 + Dexamethasone 20 mg IV/PO D1,2,8,9,15,16) q21 Days (Induction Schema):   A cycle is every 21 days:     Lenalidomide      Dexamethasone      Bortezomib      Daratumumab and hyaluronidase-fihj    DaraVRd (Daratumumab SUBQ + Bortezomib SUBQ D1,4,8,11 + Lenalidomide PO D1-14 + Dexamethasone 20 mg IV/PO D1,2,8,9,15,16) q21 Days (Consolidation Schema):   A cycle is every 21 days:     Lenalidomide      Dexamethasone      Bortezomib      Daratumumab and hyaluronidase-fihj   **Always confirm dose/schedule in your pharmacy ordering system**  Patient Characteristics: Multiple Myeloma, Newly Diagnosed, Transplant Eligible, Unknown or Awaiting Test Results Disease Classification: Multiple Myeloma Therapeutic Status: Newly Diagnosed R2-ISS Staging: Awaiting Test Results Is Patient Eligible for Transplant<= Transplant Eligible Risk Status: Awaiting Test Results Intent of Therapy: Non-Curative / Palliative Intent, Discussed with Patient

## 2023-04-14 NOTE — Progress Notes (Signed)
Hematology/Oncology Progress note [phone] [phone]         Patient Care Team: Danella Penton, MD as PCP - General (Internal Medicine) [name], MD as Consulting Physician (Oncology)   REFERRING PROVIDER: Danella Penton, MD  CHIEF COMPLAINTS/REASON FOR VISIT:  Anemia  ASSESSMENT & PLAN:   Multiple myeloma (HCC) Bone marrow biopsy was reviewed. 65% plasma cell involvement, IgA lamda, M protein 4.3,  Diagnosis of multiple myeloma was discussed with patient.  Check beta 2 microglobulin, LDH.  Check skeletal survey, PET scan.  Chemotherapy education was provided.  We had discussed the composition of chemotherapy regimen, length of chemo cycle, duration of treatment and the time to assess response to treatment.  I explained to the patient the risks and benefits of chemotherapy Dara RVd including all but not limited to allergic reaction, blood clot, hair loss, mouth sore, nausea, vomiting, diarrhea, low blood counts, bleeding, neuropathy and risk of life threatening infection and even death, secondary malignancy etc.  Patient voices understanding and willing to proceed chemotherapy.   He has renal insufficiency, will start with dose reduced Revlimid 66mg  D1-14  Could consider one dose of Dexamethasone 20mg  x 1 to bridge him to start treatment- he has DM, recently come off metformin, will discuss with pcp for DM regimen.  # Chemotherapy education; Antiemetics-Zofran and Compazine; Acyclovir and Aspirin Rx  sent to pharmacy  Supportive care measures are necessary for patient well-being and will be provided as necessary. We spent sufficient time to discuss many aspect of care, questions were answered to patient's satisfaction.    Symptomatic anemia Recommend 1 unit of PRBC transfusion.   Goals of care, counseling/discussion Discussed with patient. He understands that his condition is not curable, but treatment.   CKD (chronic kidney disease) stage 3, GFR  30-59 ml/min (HCC) Possibly due to myeloma.  Encourage oral hydration and avoid nephrotoxins.    Type 2 diabetes mellitus (HCC) He is off metformin.  I have sent message to his PCP, he will need to be started on DM regimen. Myeloma treatment involves steroid use, he will benefit from tightening glycemic control.   Orders Placed This Encounter  Procedures   Consent Attestation for Oncology Treatment    Order Specific Question:   The patient is informed of risks, benefits, side-effects of the prescribed oncology treatment. Potential short term and long term side effects and response rates discussed. After a long discussion, the patient made informed decision to proceed.    Answer:   Yes   DG Bone Survey Met    Standing Status:   Future    Standing Expiration Date:   66/09/2023    Order Specific Question:   Reason for Exam (SYMPTOM  OR DIAGNOSIS REQUIRED)    Answer:   multiple myeloma    Order Specific Question:   Preferred imaging location?    Answer:   [facility]   NM PET Image Initial (PI) Whole Body    Standing Status:   Future    Standing Expiration Date:   66/09/2023    Order Specific Question:   If indicated for the ordered procedure, I authorize the administration of a radiopharmaceutical per Radiology protocol    Answer:   Yes    Order Specific Question:   Preferred imaging location?    Answer:   [facility]   Lactate dehydrogenase    Standing Status:   Future    Number of Occurrences:   1    Standing Expiration Date:  04/13/2024   IFE+PROTEIN ELECTRO, 24-HR UR    Standing Status:   Future    Standing Expiration Date:   66/09/2023   Beta 2 microglobulin, serum    Standing Status:   Future    Number of Occurrences:   1    Standing Expiration Date:   66/09/2023   Pretreatment RBC phenotype    Obtain prior to daratumumab treatment.    Standing Status:   Standing    Number of Occurrences:   1    Standing Expiration Date:   66/09/2023    Order Specific Question:    Medication to be given:    Answer:   Daratumumab   CBC with Differential (Cancer Center Only)    Standing Status:   Future    Standing Expiration Date:   04/21/2024   CMP (Cancer Center only)    Standing Status:   Future    Standing Expiration Date:   04/21/2024   CBC with Differential (Cancer Center Only)    Standing Status:   Future    Standing Expiration Date:   04/28/2024   CMP (Cancer Center only)    Standing Status:   Future    Standing Expiration Date:   04/28/2024   CBC with Differential (Cancer Center Only)    Standing Status:   Future    Standing Expiration Date:   05/05/2024   PHYSICIAN COMMUNICATION ORDER    Dexamethasone 20 mg PO to be given in infusion prior to Darzalex Faspro. Remainder is given at home on the day after Darzalex Faspro. Dexamethasone could be reduced to 20 mg per week, given prior to daratumumab, after induction cycle 4.  In this trial, all patients received 4 cycles of induction with DaraVRd prior to stem cell harvest, 2 cycles of consolidation, given 60-100 days post-autologous stem cell transplant, and up to two years of maintenance therapy with Daratumumab + Lenalidomide. Maintenance began within 14 days after a post-consolidation disease evaluation. The pathways committee acknowledges that additional cycles of therapy may be needed prior to transplant in clinical practice based on response and transplant logistics. If continuing beyond four cycles prior to transplant, the committee recommends following the GRIFFIN consolidation daratumumab dosing schema. See Pathway [id number] for SQ maintenance.   ONCBCN PHYSICIAN COMMUNICATION 8    Order aspirin 81-325mg  OR warfarin for thromboembolic prophylaxis. Target warfarin INR = 2-3.   Type and screen    Obtain prior to daratumumab treatment.    Standing Status:   Standing    Number of Occurrences:   1    Standing Expiration Date:   66/09/2023   Follow-up to be determined. All questions were answered. The patient knows  to call the clinic with any problems, questions or concerns.  [name], MD, PhD [facility] Hematology Oncology 04/14/2023     HISTORY OF PRESENTING ILLNESS:  Victor Phillips is a  67 y.o.  male with PMH listed below who was referred to me for anemia  02/11/2023 - 02/14/2023 recent hospitalization due to pneumonia, respiratory failure.  He was found to have a hemoglobin decreased at 6.8, status post PRBC transfusion during admission.  EGD showed gastritis.  Colonoscopy was not remarkable. 02/11/2023 TIBC 221 ferritin 107, iron saturation 16. Patient is currently taking fusion plus Vitamin B12 level in the 300s. His echo showed grade 2 diastolic CHF.  He denies recent chest pain on exertion, shortness of breath on minimal exertion, pre-syncopal episodes, or palpitations He had not noticed any recent bleeding such as  epistaxis, hematuria or hematochezia.  He denies over the counter NSAID ingestion. He is not  on antiplatelets agents. He denies any pica and eats a variety of diet.  Oncology History  Multiple myeloma (HCC)  04/14/2023 Initial Diagnosis   Multiple myeloma   03/13/2020 multiple myeloma panel showed M protein 4.3, IgA lambda Free lamda Level 142,-light chain ratio 0.07 04/05/2023 bone marrow biopsy showed hypercellular bone marrow with plasma cell neoplasm.The bone marrow is hypercellular for age with prominent increase in plasma cells representing 65% of all cells in the aspirate associated with interstitial infiltrates and numerous variably sized aggregates in  the clot and biopsy sections.  The plasma cells display lambda light chain restriction consistent with plasma cell neoplasm.  Normal cytogenetics.  FISH studies pending.    04/14/2023 Cancer Staging   Staging form: Plasma Cell Myeloma and Plasma Cell Disorders, AJCC 8th Edition - Clinical: Albumin (g/dL): 2.6, ISS: Stage II, High-risk cytogenetics: Unknown, LDH: Normal - Signed by [name], MD on 04/14/2023 Stage prefix:  Initial diagnosis Albumin range (g/dL): Less than 3.5 Cytogenetics: Unknown   04/22/2023 -  Chemotherapy   Patient is on Treatment Plan : MYELOMA NEWLY DIAGNOSED TRANSPLANT CANDIDATE DaraVRd (Daratumumab SQ) q21d x 6 Cycles (Induction/Consolidation)        MEDICAL HISTORY:  Past Medical History:  Diagnosis Date   Diabetes mellitus without complication (HCC)    Hypertension     SURGICAL HISTORY: Past Surgical History:  Procedure Laterality Date   COLONOSCOPY N/A 02/13/2023   Procedure: COLONOSCOPY;  Surgeon: Toledo, Boykin Nearing, MD;  Location: [facility] ENDOSCOPY;  Service: Gastroenterology;  Laterality: N/A;   COLONOSCOPY N/A 02/14/2023   Procedure: COLONOSCOPY;  Surgeon: Toledo, Boykin Nearing, MD;  Location: [facility] ENDOSCOPY;  Service: Gastroenterology;  Laterality: N/A;   ESOPHAGOGASTRODUODENOSCOPY N/A 02/13/2023   Procedure: ESOPHAGOGASTRODUODENOSCOPY (EGD);  Surgeon: Toledo, Boykin Nearing, MD;  Location: [facility] ENDOSCOPY;  Service: Gastroenterology;  Laterality: N/A;    SOCIAL HISTORY: Social History   Socioeconomic History   Marital status: Married    Spouse name: Not on file   Number of children: Not on file   Years of education: Not on file   Highest education level: Not on file  Occupational History   Not on file  Tobacco Use   Smoking status: Never   Smokeless tobacco: Never  Vaping Use   Vaping status: Never Used  Substance and Sexual Activity   Alcohol use: Not Currently    Comment: quit approx 2001   Drug use: No   Sexual activity: Not on file  Other Topics Concern   Not on file  Social History Narrative   Not on file   Social Determinants of Health   Financial Resource Strain: Low Risk  (02/03/2023)   Received from [facility], [facility]   Overall Financial Resource Strain (CARDIA)    Difficulty of Paying Living Expenses: Not very hard  Food Insecurity: No Food Insecurity (03/14/2023)   Hunger Vital Sign    Worried About Running Out  of Food in the Last Year: Never true    Ran Out of Food in the Last Year: Never true  Transportation Needs: No Transportation Needs (03/14/2023)   PRAPARE - Administrator, Civil Service (Medical): No    Lack of Transportation (Non-Medical): No  Physical Activity: Sufficiently Active (09/24/2020)   Received from [facility], [facility]   Exercise Vital Sign    Days of Exercise per Week:  6 days    Minutes of Exercise per Session: 30 min  Stress: No Stress Concern Present (09/24/2020)   Received from [facility], [facility]   [employer] of Occupational Health - Occupational Stress Questionnaire    Feeling of Stress : Not at all  Social Connections: Socially Integrated (09/24/2020)   Received from [facility], [facility]   Social Connection and Isolation Panel [NHANES]    Frequency of Communication with Friends and Family: More than three times a week    Frequency of Social Gatherings with Friends and Family: Once a week    Attends Religious Services: More than 4 times per year    Active Member of [employer] or Organizations: Yes    Attends Engineer, structural: More than 4 times per year    Marital Status: Married  Catering manager Violence: Not At Risk (03/14/2023)   Humiliation, Afraid, Rape, and Kick questionnaire    Fear of Current or Ex-Partner: No    Emotionally Abused: No    Physically Abused: No    Sexually Abused: No    FAMILY HISTORY: Family History  Problem Relation Age of Onset   Diabetes Mother    Heart attack Father     ALLERGIES:  has No Known Allergies.  MEDICATIONS:  Current Outpatient Medications  Medication Sig Dispense Refill   acetaminophen (TYLENOL) 325 MG tablet Take 2 tablets (650 mg total) by mouth every 6 (six) hours as needed for mild pain (or Fever >/= 101). 90 tablet 1   atorvastatin (LIPITOR) 10 MG tablet Take 1 tablet  by mouth daily.     Multiple Vitamin (MULTIVITAMIN WITH MINERALS) TABS tablet Take 1 tablet by mouth daily. 90 tablet 1   olmesartan-hydrochlorothiazide (BENICAR HCT) 20-12.5 MG tablet Take 1 tablet by mouth daily.     acyclovir (ZOVIRAX) 400 MG tablet Take 1 tablet (400 mg total) by mouth 2 (two) times daily. 60 tablet 5   aspirin EC 81 MG tablet Take 1 tablet (81 mg total) by mouth daily. 100 tablet 3   dexamethasone (DECADRON) 4 MG tablet Take 5 tabs (20 mg) weekly the day after daratumumab for 12 weeks. Take with breakfast. 20 tablet 5   Fe Fum-Vit C-Vit B12-FA (TRIGELS-F FORTE) CAPS capsule Take 1 capsule by mouth 2 (two) times daily. (Patient not taking: Reported on 04/05/2023) 60 capsule 3   lenalidomide (REVLIMID) 10 MG capsule Take 1 capsule (10 mg total) by mouth daily. Take for 14 days on, 7 days off, repeat every 21 days x 6. [employer] Auth #    Date Obtained 14 capsule 0   ondansetron (ZOFRAN) 8 MG tablet Take 1 tablet (8 mg total) by mouth every 8 (eight) hours as needed for nausea or vomiting. 30 tablet 1   prochlorperazine (COMPAZINE) 10 MG tablet Take 1 tablet (10 mg total) by mouth every 6 (six) hours as needed for nausea or vomiting. 30 tablet 1   No current facility-administered medications for this visit.    Review of Systems - Oncology  PHYSICAL EXAMINATION: Vitals:   04/14/23 1044  BP: (!) 165/96  Pulse: 83  Resp: 18  Temp: (!) 97.5 F (36.4 C)  SpO2: (!) 83%   Filed Weights   04/14/23 1044  Weight: 170 lb 14.4 oz (77.5 kg)    Physical Exam   LABORATORY DATA:  I have reviewed the data as listed    Latest Ref Rng & Units 04/14/2023  10:28 AM 04/13/2023   12:34 PM 04/05/2023    8:33 AM  CBC  WBC 4.0 - 10.5 K/uL 6.1  5.9  5.7   Hemoglobin 13.0 - 17.0 g/dL 7.6  8.1  8.9   Hematocrit 39.0 - 52.0 % 23.3  24.6  26.5   Platelets 150 - 400 K/uL 145  151  161       Latest Ref Rng & Units 04/13/2023   12:34 PM 02/13/2023    3:29 AM 02/12/2023    3:26 AM  CMP   Glucose 70 - 99 mg/dL 098  90  71   BUN 8 - 23 mg/dL 43  33  34   Creatinine 0.61 - 1.24 mg/dL 1.19  1.47  8.29   Sodium 135 - 145 mmol/L 131  134  135   Potassium 3.5 - 5.1 mmol/L 5.2  4.5  4.4   Chloride 98 - 111 mmol/L 100  100  104   CO2 22 - 32 mmol/L 26  25  25    Calcium 8.9 - 10.3 mg/dL 8.8  8.7  8.6   Total Protein 6.5 - 8.1 g/dL 56.2     Total Bilirubin 0.3 - 1.2 mg/dL 0.4     Alkaline Phos 38 - 126 U/L 94     AST 15 - 41 U/L 38     ALT 0 - 44 U/L 58      Lab Results  Component Value Date   IRON 100 03/14/2023   TIBC 272 03/14/2023   IRONPCTSAT 37 03/14/2023   FERRITIN 249 03/14/2023     RADIOGRAPHIC STUDIES: I have personally reviewed the radiological images as listed and agreed with the findings in the report. CT BONE MARROW BIOPSY & ASPIRATION  Result Date: 04/05/2023 CLINICAL DATA:  Macrocytic anemia and elevated lambda free light chains. Bone marrow biopsy required for further workup. EXAM: CT GUIDED BONE MARROW ASPIRATION AND BIOPSY ANESTHESIA/SEDATION: Moderate (conscious) sedation was employed during this procedure. A total of Versed 1.0 mg and Fentanyl 50 mcg was administered intravenously by radiology nursing. Moderate Sedation Time: 18 minutes. The patient's level of consciousness and vital signs were monitored continuously by radiology nursing throughout the procedure under my direct supervision. PROCEDURE: The procedure risks, benefits, and alternatives were explained to the patient. Questions regarding the procedure were encouraged and answered. The patient understands and consents to the procedure. A time out was performed prior to initiating the procedure. The right gluteal region was prepped with chlorhexidine. Sterile gown and sterile gloves were used for the procedure. Local anesthesia was provided with 1% Lidocaine. Under CT guidance, an 11 gauge On Control bone cutting needle was advanced from a posterior approach into the right iliac bone. Needle positioning  was confirmed with CT. Initial non heparinized and heparinized aspirate samples were obtained of bone marrow. Core biopsy was performed via the On Control drill needle. COMPLICATIONS: None FINDINGS: Inspection of initial aspirate did reveal visible particles. Intact core biopsy sample was obtained. IMPRESSION: CT guided bone marrow biopsy of right posterior iliac bone with both aspirate and core samples obtained. Electronically Signed   By: Irish Lack M.D.   On: 04/05/2023 11:31

## 2023-04-15 ENCOUNTER — Inpatient Hospital Stay: Payer: Medicare HMO

## 2023-04-15 ENCOUNTER — Encounter: Payer: Self-pay | Admitting: Oncology

## 2023-04-15 DIAGNOSIS — D649 Anemia, unspecified: Secondary | ICD-10-CM

## 2023-04-15 DIAGNOSIS — Z5112 Encounter for antineoplastic immunotherapy: Secondary | ICD-10-CM | POA: Diagnosis not present

## 2023-04-15 MED ORDER — DIPHENHYDRAMINE HCL 25 MG PO CAPS
25.0000 mg | ORAL_CAPSULE | Freq: Once | ORAL | Status: AC
  Administered 2023-04-15: 25 mg via ORAL
  Filled 2023-04-15: qty 1

## 2023-04-15 MED ORDER — ACETAMINOPHEN 325 MG PO TABS
650.0000 mg | ORAL_TABLET | Freq: Once | ORAL | Status: AC
  Administered 2023-04-15: 650 mg via ORAL
  Filled 2023-04-15: qty 2

## 2023-04-15 MED ORDER — SODIUM CHLORIDE 0.9% IV SOLUTION
250.0000 mL | Freq: Once | INTRAVENOUS | Status: AC
  Administered 2023-04-15: 250 mL via INTRAVENOUS
  Filled 2023-04-15: qty 250

## 2023-04-15 NOTE — Progress Notes (Signed)
Pharmacist Chemotherapy Monitoring - Initial Assessment    Anticipated start date: 04/22/23   The following has been reviewed per standard work regarding the patient's treatment regimen: The patient's diagnosis, treatment plan and drug doses, and organ/hematologic function Lab orders and baseline tests specific to treatment regimen  The treatment plan start date, drug sequencing, and pre-medications Prior authorization status  Patient's documented medication list, including drug-drug interaction screen and prescriptions for anti-emetics and supportive care specific to the treatment regimen The drug concentrations, fluid compatibility, administration routes, and timing of the medications to be used The patient's access for treatment and lifetime cumulative dose history, if applicable  The patient's medication allergies and previous infusion related reactions, if applicable   Changes made to treatment plan:  N/A  Follow up needed:  F/U pre-tx RBC & T&S drawn prior to starting Daratumumab.   Ebony Hail, Pharm.D., CPP 04/15/2023@4 :45 PM

## 2023-04-15 NOTE — Patient Instructions (Signed)

## 2023-04-16 ENCOUNTER — Other Ambulatory Visit: Payer: Self-pay

## 2023-04-19 ENCOUNTER — Encounter: Payer: Self-pay | Admitting: Oncology

## 2023-04-21 ENCOUNTER — Inpatient Hospital Stay: Payer: Medicare HMO

## 2023-04-22 ENCOUNTER — Other Ambulatory Visit (HOSPITAL_COMMUNITY): Payer: Self-pay

## 2023-04-22 ENCOUNTER — Ambulatory Visit: Payer: Medicare HMO

## 2023-04-22 ENCOUNTER — Encounter: Payer: Self-pay | Admitting: Oncology

## 2023-04-22 ENCOUNTER — Telehealth: Payer: Self-pay

## 2023-04-22 ENCOUNTER — Inpatient Hospital Stay: Payer: Medicare HMO

## 2023-04-22 ENCOUNTER — Inpatient Hospital Stay: Payer: Medicare HMO | Admitting: Oncology

## 2023-04-22 ENCOUNTER — Other Ambulatory Visit: Payer: Self-pay | Admitting: Oncology

## 2023-04-22 ENCOUNTER — Telehealth: Payer: Self-pay | Admitting: Pharmacist

## 2023-04-22 ENCOUNTER — Telehealth: Payer: Self-pay | Admitting: *Deleted

## 2023-04-22 ENCOUNTER — Other Ambulatory Visit: Payer: Self-pay

## 2023-04-22 VITALS — BP 150/90 | HR 79 | Temp 96.9°F

## 2023-04-22 VITALS — BP 174/87 | HR 63 | Temp 96.1°F | Resp 18 | Wt 173.1 lb

## 2023-04-22 DIAGNOSIS — C9000 Multiple myeloma not having achieved remission: Secondary | ICD-10-CM

## 2023-04-22 DIAGNOSIS — E119 Type 2 diabetes mellitus without complications: Secondary | ICD-10-CM

## 2023-04-22 DIAGNOSIS — D649 Anemia, unspecified: Secondary | ICD-10-CM

## 2023-04-22 DIAGNOSIS — R630 Anorexia: Secondary | ICD-10-CM

## 2023-04-22 DIAGNOSIS — Z5112 Encounter for antineoplastic immunotherapy: Secondary | ICD-10-CM | POA: Diagnosis not present

## 2023-04-22 DIAGNOSIS — Z5111 Encounter for antineoplastic chemotherapy: Secondary | ICD-10-CM

## 2023-04-22 DIAGNOSIS — N1832 Chronic kidney disease, stage 3b: Secondary | ICD-10-CM | POA: Diagnosis not present

## 2023-04-22 DIAGNOSIS — E875 Hyperkalemia: Secondary | ICD-10-CM

## 2023-04-22 LAB — TYPE AND SCREEN
ABO/RH(D): B POS
Antibody Screen: POSITIVE
Unit division: 0

## 2023-04-22 LAB — BPAM RBC
Blood Product Expiration Date: 202409082359
Unit Type and Rh: 7300

## 2023-04-22 LAB — CMP (CANCER CENTER ONLY)
ALT: 62 U/L — ABNORMAL HIGH (ref 0–44)
AST: 35 U/L (ref 15–41)
Albumin: 2.6 g/dL — ABNORMAL LOW (ref 3.5–5.0)
Alkaline Phosphatase: 91 U/L (ref 38–126)
Anion gap: 7 (ref 5–15)
BUN: 48 mg/dL — ABNORMAL HIGH (ref 8–23)
CO2: 20 mmol/L — ABNORMAL LOW (ref 22–32)
Calcium: 8.9 mg/dL (ref 8.9–10.3)
Chloride: 106 mmol/L (ref 98–111)
Creatinine: 1.66 mg/dL — ABNORMAL HIGH (ref 0.61–1.24)
GFR, Estimated: 45 mL/min — ABNORMAL LOW (ref >60)
Glucose, Bld: 195 mg/dL — ABNORMAL HIGH (ref 70–99)
Potassium: 5 mmol/L (ref 3.5–5.1)
Sodium: 133 mmol/L — ABNORMAL LOW (ref 135–145)
Total Bilirubin: 0.4 mg/dL (ref 0.3–1.2)
Total Protein: 11 g/dL — ABNORMAL HIGH (ref 6.5–8.1)

## 2023-04-22 LAB — CBC WITH DIFFERENTIAL (CANCER CENTER ONLY)
Abs Immature Granulocytes: 0.07 10*3/uL (ref 0.00–0.07)
Basophils Absolute: 0 10*3/uL (ref 0.0–0.1)
Basophils Relative: 1 %
Eosinophils Absolute: 0.1 10*3/uL (ref 0.0–0.5)
Eosinophils Relative: 2 %
HCT: 24.4 % — ABNORMAL LOW (ref 39.0–52.0)
Hemoglobin: 7.8 g/dL — ABNORMAL LOW (ref 13.0–17.0)
Immature Granulocytes: 2 %
Lymphocytes Relative: 21 %
Lymphs Abs: 1 10*3/uL (ref 0.7–4.0)
MCH: 33.5 pg (ref 26.0–34.0)
MCHC: 32 g/dL (ref 30.0–36.0)
MCV: 104.7 fL — ABNORMAL HIGH (ref 80.0–100.0)
Monocytes Absolute: 0.4 10*3/uL (ref 0.1–1.0)
Monocytes Relative: 10 %
Neutro Abs: 2.9 10*3/uL (ref 1.7–7.7)
Neutrophils Relative %: 64 %
Platelet Count: 133 10*3/uL — ABNORMAL LOW (ref 150–400)
RBC: 2.33 MIL/uL — ABNORMAL LOW (ref 4.22–5.81)
RDW: 17.4 % — ABNORMAL HIGH (ref 11.5–15.5)
WBC Count: 4.5 10*3/uL (ref 4.0–10.5)
nRBC: 0 % (ref 0.0–0.2)

## 2023-04-22 LAB — PREPARE RBC (CROSSMATCH)

## 2023-04-22 LAB — SAMPLE TO BLOOD BANK

## 2023-04-22 LAB — PRETREATMENT RBC PHENOTYPE

## 2023-04-22 MED ORDER — LENALIDOMIDE 10 MG PO CAPS: 10.0000 mg | ORAL_CAPSULE | Freq: Every day | ORAL | 0 refills | Status: DC

## 2023-04-22 MED ORDER — MONTELUKAST SODIUM 10 MG PO TABS
10.0000 mg | ORAL_TABLET | Freq: Once | ORAL | Status: AC
  Administered 2023-04-22: 10 mg via ORAL
  Filled 2023-04-22: qty 1

## 2023-04-22 MED ORDER — DEXAMETHASONE 4 MG PO TABS
20.0000 mg | ORAL_TABLET | Freq: Once | ORAL | Status: AC
  Administered 2023-04-22: 20 mg via ORAL
  Filled 2023-04-22: qty 5

## 2023-04-22 MED ORDER — DARATUMUMAB-HYALURONIDASE-FIHJ 1800-30000 MG-UT/15ML ~~LOC~~ SOLN
1800.0000 mg | Freq: Once | SUBCUTANEOUS | Status: AC
  Administered 2023-04-22: 1800 mg via SUBCUTANEOUS
  Filled 2023-04-22: qty 15

## 2023-04-22 MED ORDER — ACETAMINOPHEN 325 MG PO TABS
650.0000 mg | ORAL_TABLET | Freq: Once | ORAL | Status: AC
  Administered 2023-04-22: 650 mg via ORAL
  Filled 2023-04-22: qty 2

## 2023-04-22 MED ORDER — DIPHENHYDRAMINE HCL 25 MG PO CAPS
50.0000 mg | ORAL_CAPSULE | Freq: Once | ORAL | Status: AC
  Administered 2023-04-22: 50 mg via ORAL
  Filled 2023-04-22: qty 2

## 2023-04-22 MED ORDER — BORTEZOMIB CHEMO SQ INJECTION 3.5 MG (2.5MG/ML)
1.3000 mg/m2 | Freq: Once | INTRAMUSCULAR | Status: AC
  Administered 2023-04-22: 2.5 mg via SUBCUTANEOUS
  Filled 2023-04-22: qty 1

## 2023-04-22 NOTE — Assessment & Plan Note (Signed)
Recommend 1 unit of PRBC transfusion next week.

## 2023-04-22 NOTE — Telephone Encounter (Signed)
[facility] is unable to fill patient prescription for Revlimid. There is note from [facility] that script was fill today with [facility]

## 2023-04-22 NOTE — Assessment & Plan Note (Addendum)
Chemotherapy plan as listed above.   Discussed about antiemetics instruction.

## 2023-04-22 NOTE — Telephone Encounter (Signed)
Clinical Pharmacist Practitioner Encounter   Received new prescription for Revlimid (lenalidomide) for the treatment of IgA lambda multiple myeloma in conjunction with daratumumab, bortezomib, and dexamethasone, planned duration until disease control or unacceptable drug toxicity.  CMP from 04/22/23 assessed, SCr elevated at 1.66 and CrCl ~42 ml/min. Prescription dose and frequency assessed. Dose has been adjusted for renal impairment  Current medication list in Epic reviewed, no DDIs with lenalidomide identified.  Evaluated chart and no patient barriers to medication adherence identified.   Prescription has been e-scribed to the [facility] for benefits analysis and approval.  [facility] will continue to follow for insurance authorization, copayment issues, initial counseling and start date.   Remi Haggard, PharmD, BCPS, BCOP, CPP Hematology/Oncology Clinical Pharmacist Practitioner [facility] [phone]  04/22/2023 9:30 AM

## 2023-04-22 NOTE — Progress Notes (Signed)
Hematology/Oncology Progress note [phone] [phone]         Patient Care Team: Danella Penton, MD as PCP - General (Internal Medicine) [name], MD as Consulting Physician (Oncology)   REFERRING PROVIDER: [name], MD  CHIEF COMPLAINTS/REASON FOR VISIT:  Anemia  ASSESSMENT & PLAN:   Cancer Staging  Multiple myeloma [facility]) Staging form: Plasma Cell Myeloma and Plasma Cell Disorders, AJCC 8th Edition - Clinical stage from 04/14/2023: RISS Stage II (Beta-2-microglobulin (mg/L): 8.4, Albumin (g/dL): 2.6, ISS: Stage III, High-risk cytogenetics: Absent, LDH: Normal) - Signed by [name], MD on 04/22/2023   Multiple myeloma (HCC) Bone marrow biopsy was reviewed. 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 Proceed with C1 D1 Daratumumab and Velcade today. He gets Dexamethasone 20mg  weekly prior to Daratumumab treatments.  We discussed about Acyclovir, Aspirin 81mg  recommendation. He has renal insufficiency, will start with dose reduced Revlimid 10mg  till Day 14 when he gets supply.   Type 2 diabetes mellitus (HCC) He is off metformin. Started on glipizide  Recommend diabetic diet.    CKD (chronic kidney disease) stage 3, GFR 30-59 ml/min (HCC) Possibly due to myeloma.  Encourage oral hydration and avoid nephrotoxins.    Hyperkalemia Improved.  Recommend patient to avoid potassium rich food.  Patient would like to have dietary advise  Refer to nutritionist   Symptomatic anemia Recommend 1 unit of PRBC transfusion next week.    Encounter for antineoplastic chemotherapy Chemotherapy plan as listed above.   Discussed about antiemetics instruction.   Orders Placed This Encounter  Procedures   CMP (Cancer Center only)    Standing Status:   Future    Standing Expiration Date:   05/05/2024   Ambulatory Referral to [facility] Nutrition    Referral  Priority:   Routine    Referral Type:   Consultation    Referral Reason:   Specialty Services Required    Number of Visits Requested:   1   Sample to Blood Bank    Standing Status:   Future    Standing Expiration Date:   04/21/2024   Follow-up  per LOS  We spent sufficient time to discuss many aspect of care, questions were answered to patient's satisfaction. A total of 40 minutes was spent on this visit.  With 5 minutes spent reviewing image findings, pathology reports, 30 minutes counseling the patient on the diagnosis, goal of care, chemotherapy treatments, medication instructions, side effects of the treatment, management of symptoms.  Additional 5 minutes was spent on answering patient's questions.  All questions were answered. The patient knows to call the clinic with any problems, questions or concerns.  [name], MD, PhD [facility] Hematology Oncology 04/22/2023     HISTORY OF PRESENTING ILLNESS:  Victor Phillips is a  67 y.o.  male with PMH listed below who was referred to me for anemia  02/11/2023 - 02/14/2023 recent hospitalization due to pneumonia, respiratory failure.  He was found to have a hemoglobin decreased at 6.8, status post PRBC transfusion during admission.  EGD showed gastritis.  Colonoscopy was not remarkable. 02/11/2023 TIBC 221 ferritin 107, iron saturation 16. Patient is currently taking fusion plus Vitamin B12 level in the 300s. His echo showed grade 2 diastolic CHF.  He denies recent chest pain on exertion, shortness of breath on minimal exertion, pre-syncopal episodes, or palpitations He had not noticed any recent bleeding such as  epistaxis, hematuria or hematochezia.  He denies over the counter NSAID ingestion.  Oncology History  Multiple myeloma (HCC)  04/14/2023 Initial Diagnosis   Multiple myeloma   03/13/2020 multiple myeloma panel showed M protein 4.3, IgA lambda Free lamda Level 142,-light chain ratio 0.07 04/05/2023 bone marrow biopsy showed  hypercellular bone marrow with plasma cell neoplasm.The bone marrow is hypercellular for age with prominent increase in plasma cells representing 65% of all cells in the aspirate associated with interstitial infiltrates and numerous variably sized aggregates in  the clot and biopsy sections.  The plasma cells display lambda light chain restriction consistent with plasma cell neoplasm.  Normal cytogenetics.  FISH studies pending.    04/14/2023 Cancer Staging   Staging form: Plasma Cell Myeloma and Plasma Cell Disorders, AJCC 8th Edition - Clinical stage from 04/14/2023: RISS Stage II (Beta-2-microglobulin (mg/L): 8.4, Albumin (g/dL): 2.6, ISS: Stage III, High-risk cytogenetics: Absent, LDH: Normal) - Signed by [name], MD on 04/22/2023 Stage prefix: Initial diagnosis Beta 2 microglobulin range (mg/L): Greater than or equal to 5.5 Albumin range (g/dL): Less than 3.5 Cytogenetics: 1q addition, Other mutation   04/22/2023 -  Chemotherapy   Patient is on Treatment Plan : MYELOMA NEWLY DIAGNOSED TRANSPLANT CANDIDATE DaraVRd (Daratumumab SQ) q21d x 6 Cycles (Induction/Consolidation)      He has not picked up antiemetics, Aspirin, Acyclovir Rx. Started on Glipizide for DM.  No new complaints. + fatigue.  S/p PRBC transfusion.   MEDICAL HISTORY:  Past Medical History:  Diagnosis Date   Diabetes mellitus without complication (HCC)    Hypertension     SURGICAL HISTORY: Past Surgical History:  Procedure Laterality Date   COLONOSCOPY N/A 02/13/2023   Procedure: COLONOSCOPY;  Surgeon: Toledo, Boykin Nearing, MD;  Location: [facility] ENDOSCOPY;  Service: Gastroenterology;  Laterality: N/A;   COLONOSCOPY N/A 02/14/2023   Procedure: COLONOSCOPY;  Surgeon: Toledo, Boykin Nearing, MD;  Location: [facility] ENDOSCOPY;  Service: Gastroenterology;  Laterality: N/A;   ESOPHAGOGASTRODUODENOSCOPY N/A 02/13/2023   Procedure: ESOPHAGOGASTRODUODENOSCOPY (EGD);  Surgeon: Toledo, Boykin Nearing, MD;  Location: [facility] ENDOSCOPY;  Service:  Gastroenterology;  Laterality: N/A;    SOCIAL HISTORY: Social History   Socioeconomic History   Marital status: Married    Spouse name: Not on file   Number of children: Not on file   Years of education: Not on file   Highest education level: Not on file  Occupational History   Not on file  Tobacco Use   Smoking status: Never   Smokeless tobacco: Never  Vaping Use   Vaping status: Never Used  Substance and Sexual Activity   Alcohol use: Not Currently    Comment: quit approx 2001   Drug use: No   Sexual activity: Not on file  Other Topics Concern   Not on file  Social History Narrative   Not on file   Social Determinants of Health   Financial Resource Strain: Low Risk  (02/03/2023)   Received from [facility], [facility]   Overall Financial Resource Strain (CARDIA)    Difficulty of Paying Living Expenses: Not very hard  Food Insecurity: No Food Insecurity (03/14/2023)   Hunger Vital Sign    Worried About Running Out of Food in the Last Year: Never true    Ran Out of Food in the Last Year: Never true  Transportation Needs: No Transportation Needs (03/14/2023)   PRAPARE - Administrator, Civil Service (Medical): No    Lack of Transportation (Non-Medical): No  Physical Activity: Sufficiently Active (09/24/2020)   Received [facility], [facility]   Exercise Vital Sign    Days of Exercise per Week: 6 days    Minutes of Exercise per Session: 30 min  Stress: No Stress Concern Present (09/24/2020)   Received from [facility], [facility]   [employer] of Occupational Health - Occupational Stress Questionnaire    Feeling of Stress : Not at all  Social Connections: Socially Integrated (09/24/2020)   Received from [facility], [facility]   Social Connection and Isolation Panel [NHANES]    Frequency of Communication  with Friends and Family: More than three times a week    Frequency of Social Gatherings with Friends and Family: Once a week    Attends Religious Services: More than 4 times per year    Active Member of Clubs or Organizations: Yes    Attends Engineer, structural: More than 4 times per year    Marital Status: Married  Catering manager Violence: Not At Risk (03/14/2023)   Humiliation, Afraid, Rape, and Kick questionnaire    Fear of Current or Ex-Partner: No    Emotionally Abused: No    Physically Abused: No    Sexually Abused: No    FAMILY HISTORY: Family History  Problem Relation Age of Onset   Diabetes Mother    Heart attack Father     ALLERGIES:  has No Known Allergies.  MEDICATIONS:  Current Outpatient Medications  Medication Sig Dispense Refill   acetaminophen (TYLENOL) 325 MG tablet Take 2 tablets (650 mg total) by mouth every 6 (six) hours as needed for mild pain (or Fever >/= 101). 90 tablet 1   acyclovir (ZOVIRAX) 400 MG tablet Take 1 tablet (400 mg total) by mouth 2 (two) times daily. 60 tablet 5   aspirin EC 81 MG tablet Take 1 tablet (81 mg total) by mouth daily. 100 tablet 3   atorvastatin (LIPITOR) 10 MG tablet Take 1 tablet by mouth daily.     glipiZIDE (GLUCOTROL XL) 2.5 MG 24 hr tablet Take by mouth.     lenalidomide (REVLIMID) 10 MG capsule Take 1 capsule (10 mg total) by mouth daily. Take for 14 days on, 7 days off, repeat every 21 days x 6. [employer] Auth #    Date Obtained 14 capsule 0   Multiple Vitamin (MULTIVITAMIN WITH MINERALS) TABS tablet Take 1 tablet by mouth daily. 90 tablet 1   olmesartan-hydrochlorothiazide (BENICAR HCT) 20-12.5 MG tablet Take 1 tablet by mouth daily.     ondansetron (ZOFRAN) 8 MG tablet Take 1 tablet (8 mg total) by mouth every 8 (eight) hours as needed for nausea or vomiting. 30 tablet 1   prochlorperazine (COMPAZINE) 10 MG tablet Take 1 tablet (10 mg total) by mouth every 6 (six) hours as needed for nausea or vomiting. 30  tablet 1   Fe Fum-Vit C-Vit B12-FA (TRIGELS-F FORTE) CAPS capsule Take 1 capsule by mouth 2 (two) times daily. (Patient not taking: Reported on 04/05/2023) 60 capsule 3   No current facility-administered medications for this visit.    Review of Systems  Constitutional:  Positive for fatigue. Negative for appetite change, chills, fever and unexpected weight change.  HENT:   Negative for hearing loss and voice change.   Eyes:  Negative for eye problems and icterus.  Respiratory:  Negative for chest tightness, cough and shortness of breath.   Cardiovascular:  Negative for  chest pain and leg swelling.  Gastrointestinal:  Negative for abdominal distention and abdominal pain.  Endocrine: Negative for hot flashes.  Genitourinary:  Negative for difficulty urinating, dysuria and frequency.   Musculoskeletal:  Negative for arthralgias.  Skin:  Negative for itching and rash.  Neurological:  Negative for light-headedness and numbness.  Hematological:  Negative for adenopathy. Does not bruise/bleed easily.  Psychiatric/Behavioral:  Negative for confusion.     PHYSICAL EXAMINATION: Vitals:   04/22/23 0850  BP: (!) 174/87  Pulse: 63  Resp: 18  Temp: (!) 96.1 F (35.6 C)  SpO2: 100%   Filed Weights   04/22/23 0850  Weight: 173 lb 1.6 oz (78.5 kg)    Physical Exam Constitutional:      General: He is not in acute distress. HENT:     Head: Normocephalic and atraumatic.  Eyes:     General: No scleral icterus. Cardiovascular:     Rate and Rhythm: Normal rate and regular rhythm.     Heart sounds: Normal heart sounds.  Pulmonary:     Effort: Pulmonary effort is normal. No respiratory distress.     Breath sounds: No wheezing.  Abdominal:     General: Bowel sounds are normal. There is no distension.     Palpations: Abdomen is soft.  Musculoskeletal:        General: No deformity. Normal range of motion.     Cervical back: Normal range of motion and neck supple.  Skin:    General: Skin  is warm and dry.     Findings: No erythema or rash.  Neurological:     Mental Status: He is alert and oriented to person, place, and time. Mental status is at baseline.     Cranial Nerves: No cranial nerve deficit.     Coordination: Coordination normal.  Psychiatric:        Mood and Affect: Mood normal.      LABORATORY DATA:  I have reviewed the data as listed    Latest Ref Rng & Units 04/22/2023    8:35 AM 04/14/2023   10:28 AM 04/13/2023   12:34 PM  CBC  WBC 4.0 - 10.5 K/uL 4.5  6.1  5.9   Hemoglobin 13.0 - 17.0 g/dL 7.8  7.6  8.1   Hematocrit 39.0 - 52.0 % 24.4  23.3  24.6   Platelets 150 - 400 K/uL 133  145  151       Latest Ref Rng & Units 04/22/2023    8:35 AM 04/13/2023   12:34 PM 02/13/2023    3:29 AM  CMP  Glucose 70 - 99 mg/dL 161  096  90   BUN 8 - 23 mg/dL 48  43  33   Creatinine 0.61 - 1.24 mg/dL 0.45  4.09  8.11   Sodium 135 - 145 mmol/L 133  131  134   Potassium 3.5 - 5.1 mmol/L 5.0  5.2  4.5   Chloride 98 - 111 mmol/L 106  100  100   CO2 22 - 32 mmol/L 20  26  25    Calcium 8.9 - 10.3 mg/dL 8.9  8.8  8.7   Total Protein 6.5 - 8.1 g/dL 91.4  78.2    Total Bilirubin 0.3 - 1.2 mg/dL 0.4  0.4    Alkaline Phos 38 - 126 U/L 91  94    AST 15 - 41 U/L 35  38    ALT 0 - 44 U/L 62  58     Lab  Results  Component Value Date   IRON 100 03/14/2023   TIBC 272 03/14/2023   IRONPCTSAT 37 03/14/2023   FERRITIN 249 03/14/2023     RADIOGRAPHIC STUDIES: I have personally reviewed the radiological images as listed and agreed with the findings in the report. DG Bone Survey Met  Result Date: 04/21/2023 CLINICAL DATA:  Macrocytic anemia. EXAM: METASTATIC BONE SURVEY COMPARISON:  Chest radiograph dated 02/13/2023. Chest CTA and abdomen and pelvis CT dated 02/11/2023. FINDINGS: Borderline enlarged cardiac silhouette with an interval decrease in size. Cervical, thoracic and lumbar spine degenerative changes. Right greater than left neck calcifications. Old, healed left clavicle  fracture. Moderate right and mild left acromioclavicular joint degenerative changes. Diffuse atheromatous arterial calcifications. No lytic lesions or other intrinsic bony abnormality. IMPRESSION: 1. No lytic lesions or other intrinsic bony abnormality. 2. Borderline cardiomegaly with an interval decrease in size. 3. Diffuse atheromatous arterial calcifications including bilateral carotid artery calcifications, right greater than left. Electronically Signed   By: Beckie Salts M.D.   On: 04/21/2023 11:49   CT BONE MARROW BIOPSY & ASPIRATION  Result Date: 04/05/2023 CLINICAL DATA:  Macrocytic anemia and elevated lambda free light chains. Bone marrow biopsy required for further workup. EXAM: CT GUIDED BONE MARROW ASPIRATION AND BIOPSY ANESTHESIA/SEDATION: Moderate (conscious) sedation was employed during this procedure. A total of Versed 1.0 mg and Fentanyl 50 mcg was administered intravenously by radiology nursing. Moderate Sedation Time: 18 minutes. The patient's level of consciousness and vital signs were monitored continuously by radiology nursing throughout the procedure under my direct supervision. PROCEDURE: The procedure risks, benefits, and alternatives were explained to the patient. Questions regarding the procedure were encouraged and answered. The patient understands and consents to the procedure. A time out was performed prior to initiating the procedure. The right gluteal region was prepped with chlorhexidine. Sterile gown and sterile gloves were used for the procedure. Local anesthesia was provided with 1% Lidocaine. Under CT guidance, an 11 gauge On Control bone cutting needle was advanced from a posterior approach into the right iliac bone. Needle positioning was confirmed with CT. Initial non heparinized and heparinized aspirate samples were obtained of bone marrow. Core biopsy was performed via the On Control drill needle. COMPLICATIONS: None FINDINGS: Inspection of initial aspirate did reveal  visible particles. Intact core biopsy sample was obtained. IMPRESSION: CT guided bone marrow biopsy of right posterior iliac bone with both aspirate and core samples obtained. Electronically Signed   By: Irish Lack M.D.   On: 04/05/2023 11:31

## 2023-04-22 NOTE — Patient Instructions (Signed)
[place] [facility]  Discharge Instructions: Thank you for choosing [facility] to provide your oncology and hematology care.  If you have a lab appointment with the Cancer Center, please go directly to the Cancer Center and check in at the registration area.  Wear comfortable clothing and clothing appropriate for easy access to any Portacath or PICC line.   We strive to give you quality time with your provider. You may need to reschedule your appointment if you arrive late (15 or more minutes).  Arriving late affects you and other patients whose appointments are after yours.  Also, if you miss three or more appointments without notifying the office, you may be dismissed from the clinic at the provider's discretion.      For prescription refill requests, have your pharmacy contact our office and allow 72 hours for refills to be completed.    Today you received the following chemotherapy and/or immunotherapy agents- Velcade, Daratumumab      To help prevent nausea and vomiting after your treatment, we encourage you to take your nausea medication as directed.  BELOW ARE SYMPTOMS THAT SHOULD BE REPORTED IMMEDIATELY: *FEVER GREATER THAN 100.4 F (38 C) OR HIGHER *CHILLS OR SWEATING *NAUSEA AND VOMITING THAT IS NOT CONTROLLED WITH YOUR NAUSEA MEDICATION *UNUSUAL SHORTNESS OF BREATH *UNUSUAL BRUISING OR BLEEDING *URINARY PROBLEMS (pain or burning when urinating, or frequent urination) *BOWEL PROBLEMS (unusual diarrhea, constipation, pain near the anus) TENDERNESS IN MOUTH AND THROAT WITH OR WITHOUT PRESENCE OF ULCERS (sore throat, sores in mouth, or a toothache) UNUSUAL RASH, SWELLING OR PAIN  UNUSUAL VAGINAL DISCHARGE OR ITCHING   Items with * indicate a potential emergency and should be followed up as soon as possible or go to the Emergency Department if any problems should occur.  Please show the CHEMOTHERAPY ALERT CARD or IMMUNOTHERAPY ALERT CARD at  check-in to the Emergency Department and triage nurse.  Should you have questions after your visit or need to cancel or reschedule your appointment, please contact [place] [facility]  [phone] and follow the prompts.  Office hours are 8:00 a.m. to 4:30 p.m. Monday - Friday. Please note that voicemails left after 4:00 p.m. may not be returned until the following business day.  We are closed weekends and major holidays. You have access to a nurse at all times for urgent questions. Please call the main number to the clinic [phone] and follow the prompts.  For any non-urgent questions, you may also contact your provider using MyChart. We now offer e-Visits for anyone 54 and older to request care online for non-urgent symptoms. For details visit [email or web address].   Also download the MyChart app! Go to the app store, search "MyChart", open the app, select [place], and log in with your MyChart username and password.  Bortezomib Injection What is this medication? BORTEZOMIB (bor TEZ oh mib) treats lymphoma. It may also be used to treat multiple myeloma, a type of bone marrow cancer. It works by blocking a protein that causes cancer cells to grow and multiply. This helps to slow or stop the spread of cancer cells. This medicine may be used for other purposes; ask your health care provider or pharmacist if you have questions. COMMON BRAND NAME(S): Velcade What should I tell my care team before I take this medication? They need to know if you have any of these conditions: Dehydration Diabetes Heart disease Liver disease Tingling of the fingers or toes or other  nerve disorder An unusual or allergic reaction to bortezomib, other medications, foods, dyes, or preservatives If you or your partner are pregnant or trying to get pregnant Breastfeeding How should I use this medication? This medication is injected into a vein or under the skin. It is given by your  care team in a hospital or clinic setting. Talk to your care team about the use of this medication in children. Special care may be needed. Overdosage: If you think you have taken too much of this medicine contact a poison control center or emergency room at once. NOTE: This medicine is only for you. Do not share this medicine with others. What if I miss a dose? Keep appointments for follow-up doses. It is important not to miss your dose. Call your care team if you are unable to keep an appointment. What may interact with this medication? Ketoconazole Rifampin This list may not describe all possible interactions. Give your health care provider a list of all the medicines, herbs, non-prescription drugs, or dietary supplements you use. Also tell them if you smoke, drink alcohol, or use illegal drugs. Some items may interact with your medicine. What should I watch for while using this medication? Your condition will be monitored carefully while you are receiving this medication. You may need blood work while taking this medication. This medication may affect your coordination, reaction time, or judgment. Do not drive or operate machinery until you know how this medication affects you. Sit up or stand slowly to reduce the risk of dizzy or fainting spells. Drinking alcohol with this medication can increase the risk of these side effects. This medication may increase your risk of getting an infection. Call your care team for advice if you get a fever, chills, sore throat, or other symptoms of a cold or flu. Do not treat yourself. Try to avoid being around people who are sick. Check with your care team if you have severe diarrhea, nausea, and vomiting, or if you sweat a lot. The loss of too much body fluid may make it dangerous for you to take this medication. Talk to your care team if you may be pregnant. Serious birth defects can occur if you take this medication during pregnancy and for 7 months after the  last dose. You will need a negative pregnancy test before starting this medication. Contraception is recommended while taking this medication and for 7 months after the last dose. Your care team can help you find the option that works for you. If your partner can get pregnant, use a condom during sex while taking this medication and for 4 months after the last dose. Do not breastfeed while taking this medication and for 2 months after the last dose. This medication may cause infertility. Talk to your care team if you are concerned about your fertility. What side effects may I notice from receiving this medication? Side effects that you should report to your care team as soon as possible: Allergic reactions--skin rash, itching, hives, swelling of the face, lips, tongue, or throat Bleeding--bloody or black, tar-like stools, vomiting blood or brown material that looks like coffee grounds, red or dark brown urine, small red or purple spots on skin, unusual bruising or bleeding Bleeding in the brain--severe headache, stiff neck, confusion, dizziness, change in vision, numbness or weakness of the face, arm, or leg, trouble speaking, trouble walking, vomiting Bowel blockage--stomach cramping, unable to have a bowel movement or pass gas, loss of appetite, vomiting Heart failure--shortness of breath, swelling  of the ankles, feet, or hands, sudden weight gain, unusual weakness or fatigue Infection--fever, chills, cough, sore throat, wounds that don't heal, pain or trouble when passing urine, general feeling of discomfort or being unwell Liver injury--right upper belly pain, loss of appetite, nausea, light-colored stool, dark yellow or brown urine, yellowing skin or eyes, unusual weakness or fatigue Low blood pressure--dizziness, feeling faint or lightheaded, blurry vision Lung injury--shortness of breath or trouble breathing, cough, spitting up blood, chest pain, fever Pain, tingling, or numbness in the hands  or feet Severe or prolonged diarrhea Stomach pain, bloody diarrhea, pale skin, unusual weakness or fatigue, decrease in the amount of urine, which may be signs of hemolytic uremic syndrome Sudden and severe headache, confusion, change in vision, seizures, which may be signs of posterior reversible encephalopathy syndrome (PRES) TTP--purple spots on the skin or inside the mouth, pale skin, yellowing skin or eyes, unusual weakness or fatigue, fever, fast or irregular heartbeat, confusion, change in vision, trouble speaking, trouble walking Tumor lysis syndrome (TLS)--nausea, vomiting, diarrhea, decrease in the amount of urine, dark urine, unusual weakness or fatigue, confusion, muscle pain or cramps, fast or irregular heartbeat, joint pain Side effects that usually do not require medical attention (report to your care team if they continue or are bothersome): Constipation Diarrhea Fatigue Loss of appetite Nausea This list may not describe all possible side effects. Call your doctor for medical advice about side effects. You may report side effects to FDA at 1-800-FDA-1088. Where should I keep my medication? This medication is given in a hospital or clinic. It will not be stored at home. NOTE: This sheet is a summary. It may not cover all possible information. If you have questions about this medicine, talk to your doctor, pharmacist, or health care provider.  2024 Elsevier/Gold Standard (2022-02-02 00:00:00)  Daratumumab; Hyaluronidase Injection What is this medication? DARATUMUMAB; HYALURONIDASE (dar a toom ue mab; hye al ur ON i dase) treats multiple myeloma, a type of bone marrow cancer. Daratumumab works by blocking a protein that causes cancer cells to grow and multiply. This helps to slow or stop the spread of cancer cells. Hyaluronidase works by increasing the absorption of other medications in the body to help them work better. This medication may also be used treat amyloidosis, a  condition that causes the buildup of a protein (amyloid) in your body. It works by reducing the buildup of this protein, which decreases symptoms. It is a combination medication that contains a monoclonal antibody. This medicine may be used for other purposes; ask your health care provider or pharmacist if you have questions. COMMON BRAND NAME(S): DARZALEX FASPRO What should I tell my care team before I take this medication? They need to know if you have any of these conditions: Heart disease Infection, such as chickenpox, cold sores, herpes, hepatitis B Lung or breathing disease An unusual or allergic reaction to daratumumab, hyaluronidase, other medications, foods, dyes, or preservatives Pregnant or trying to get pregnant Breast-feeding How should I use this medication? This medication is injected under the skin. It is given by your care team in a hospital or clinic setting. Talk to your care team about the use of this medication in children. Special care may be needed. Overdosage: If you think you have taken too much of this medicine contact a poison control center or emergency room at once. NOTE: This medicine is only for you. Do not share this medicine with others. What if I miss a dose? Keep appointments for follow-up  doses. It is important not to miss your dose. Call your care team if you are unable to keep an appointment. What may interact with this medication? Interactions have not been studied. This list may not describe all possible interactions. Give your health care provider a list of all the medicines, herbs, non-prescription drugs, or dietary supplements you use. Also tell them if you smoke, drink alcohol, or use illegal drugs. Some items may interact with your medicine. What should I watch for while using this medication? Your condition will be monitored carefully while you are receiving this medication. This medication can cause serious allergic reactions. To reduce your risk,  your care team may give you other medication to take before receiving this one. Be sure to follow the directions from your care team. This medication can affect the results of blood tests to match your blood type. These changes can last for up to 6 months after the final dose. Your care team will do blood tests to match your blood type before you start treatment. Tell all of your care team that you are being treated with this medication before receiving a blood transfusion. This medication can affect the results of some tests used to determine treatment response; extra tests may be needed to evaluate response. Talk to your care team if you wish to become pregnant or think you are pregnant. This medication can cause serious birth defects if taken during pregnancy and for 3 months after the last dose. A reliable form of contraception is recommended while taking this medication and for 3 months after the last dose. Talk to your care team about effective forms of contraception. Do not breast-feed while taking this medication. What side effects may I notice from receiving this medication? Side effects that you should report to your care team as soon as possible: Allergic reactions--skin rash, itching, hives, swelling of the face, lips, tongue, or throat Heart rhythm changes--fast or irregular heartbeat, dizziness, feeling faint or lightheaded, chest pain, trouble breathing Infection--fever, chills, cough, sore throat, wounds that don't heal, pain or trouble when passing urine, general feeling of discomfort or being unwell Infusion reactions--chest pain, shortness of breath or trouble breathing, feeling faint or lightheaded Sudden eye pain or change in vision such as blurry vision, seeing halos around lights, vision loss Unusual bruising or bleeding Side effects that usually do not require medical attention (report to your care team if they continue or are  bothersome): Constipation Diarrhea Fatigue Nausea Pain, tingling, or numbness in the hands or feet Swelling of the ankles, hands, or feet This list may not describe all possible side effects. Call your doctor for medical advice about side effects. You may report side effects to FDA at 1-800-FDA-1088. Where should I keep my medication? This medication is given in a hospital or clinic. It will not be stored at home. NOTE: This sheet is a summary. It may not cover all possible information. If you have questions about this medicine, talk to your doctor, pharmacist, or health care provider.  2024 Elsevier/Gold Standard (2022-01-05 00:00:00)

## 2023-04-22 NOTE — Assessment & Plan Note (Signed)
Improved.  Recommend patient to avoid potassium rich food.  Patient would like to have dietary advise  Refer to nutritionist

## 2023-04-22 NOTE — Telephone Encounter (Signed)
Oral Oncology Patient Advocate Encounter  Was successful in securing patient a $12,000.00 grant from [facility] to provide copayment coverage for Lenalidomide.  This will keep the out of pocket expense at $0.     [employer] ID: [id number]   The billing information is as follows and has been shared with [facility].    RxBin: [id number] PCN: [id number] Member ID: [id number] Group ID: [id number] Dates of Eligibility: 03/23/23 through 03/21/24  Fund:  Multiple Myeloma - Medicare Access   Ardeen Fillers, CPhT Oncology Pharmacy Patient Advocate  [facility]  [phone] (phone) [phone] (fax) 04/22/2023 9:59 AM

## 2023-04-22 NOTE — Assessment & Plan Note (Addendum)
He is off metformin. Started on glipizide  Recommend diabetic diet.

## 2023-04-22 NOTE — Assessment & Plan Note (Addendum)
Bone marrow biopsy was reviewed. 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 Proceed with C1 D1 Daratumumab and Velcade today. He gets Dexamethasone 20mg  weekly prior to Daratumumab treatments.  We discussed about Acyclovir, Aspirin 81mg  recommendation. He has renal insufficiency, will start with dose reduced Revlimid 10mg  till Day 14 when he gets supply.

## 2023-04-22 NOTE — Telephone Encounter (Addendum)
Oral Oncology Patient Advocate Encounter  Prior Authorization for Lenalidomide has been approved.    PA# [id number]  Effective dates: 11/12/22 through 09/13/23  Patients co-pay is $3,664.40.   Obtained [employer] to make co-pay $0.00.   Script and all supporting documents sent to [facility] for processing and fulfillment.    Ardeen Fillers, CPhT Oncology Pharmacy Patient Advocate  [facility]  [phone] (phone) [phone] (fax) 04/22/2023 9:48 AM

## 2023-04-22 NOTE — Assessment & Plan Note (Signed)
 Possibly due to myeloma.  Encourage oral hydration and avoid nephrotoxins.

## 2023-04-22 NOTE — Telephone Encounter (Signed)
Oral Oncology Patient Advocate Encounter  New authorization   Received notification that prior authorization for Lenalidomide is required.   PA submitted on 04/22/23  Key [id number]  Status is pending     Ardeen Fillers, CPhT Oncology Pharmacy Patient Advocate  [facility]  [phone] (phone) [phone] (fax) 04/22/2023 9:47 AM

## 2023-04-23 ENCOUNTER — Other Ambulatory Visit: Payer: Self-pay

## 2023-04-25 ENCOUNTER — Encounter: Payer: Self-pay | Admitting: Oncology

## 2023-04-25 ENCOUNTER — Inpatient Hospital Stay: Payer: Medicare HMO | Admitting: Pharmacist

## 2023-04-25 ENCOUNTER — Inpatient Hospital Stay: Payer: Medicare HMO

## 2023-04-25 ENCOUNTER — Ambulatory Visit
Admission: RE | Admit: 2023-04-25 | Discharge: 2023-04-25 | Disposition: A | Discharge status: Home / Self Care | Payer: Medicare HMO | Source: Ambulatory Visit | Attending: Oncology | Admitting: Oncology

## 2023-04-25 VITALS — BP 116/79 | HR 87 | Temp 96.0°F | Resp 18

## 2023-04-25 DIAGNOSIS — C9000 Multiple myeloma not having achieved remission: Secondary | ICD-10-CM | POA: Insufficient documentation

## 2023-04-25 DIAGNOSIS — I700 Atherosclerosis of aorta: Secondary | ICD-10-CM | POA: Insufficient documentation

## 2023-04-25 DIAGNOSIS — Z5112 Encounter for antineoplastic immunotherapy: Secondary | ICD-10-CM | POA: Diagnosis not present

## 2023-04-25 DIAGNOSIS — D649 Anemia, unspecified: Secondary | ICD-10-CM

## 2023-04-25 LAB — GLUCOSE, CAPILLARY: Glucose-Capillary: 101 mg/dL — ABNORMAL HIGH (ref 70–99)

## 2023-04-25 MED ORDER — PROCHLORPERAZINE MALEATE 10 MG PO TABS
10.0000 mg | ORAL_TABLET | Freq: Once | ORAL | Status: AC
  Administered 2023-04-25: 10 mg via ORAL
  Filled 2023-04-25: qty 1

## 2023-04-25 MED ORDER — SODIUM CHLORIDE 0.9% IV SOLUTION
250.0000 mL | Freq: Once | INTRAVENOUS | Status: AC
  Administered 2023-04-25: 250 mL via INTRAVENOUS
  Filled 2023-04-25: qty 250

## 2023-04-25 MED ORDER — FLUDEOXYGLUCOSE F - 18 (FDG) INJECTION
9.7800 | Freq: Once | INTRAVENOUS | Status: AC | PRN
  Administered 2023-04-25: 9.78 via INTRAVENOUS

## 2023-04-25 MED ORDER — BORTEZOMIB CHEMO SQ INJECTION 3.5 MG (2.5MG/ML)
1.3000 mg/m2 | Freq: Once | INTRAMUSCULAR | Status: AC
  Administered 2023-04-25: 2.5 mg via SUBCUTANEOUS
  Filled 2023-04-25: qty 1

## 2023-04-25 MED ORDER — DIPHENHYDRAMINE HCL 25 MG PO CAPS
25.0000 mg | ORAL_CAPSULE | Freq: Once | ORAL | Status: AC
  Administered 2023-04-25: 25 mg via ORAL
  Filled 2023-04-25: qty 1

## 2023-04-25 MED ORDER — ACETAMINOPHEN 325 MG PO TABS
650.0000 mg | ORAL_TABLET | Freq: Once | ORAL | Status: AC
  Administered 2023-04-25: 650 mg via ORAL
  Filled 2023-04-25: qty 2

## 2023-04-25 NOTE — Patient Instructions (Signed)
[place] [facility] [place] [facility]  Discharge Instructions: Thank you for choosing [facility] to provide your oncology and hematology care.  If you have a lab appointment with the Cancer Center, please go directly to the Cancer Center and check in at the registration area.  Wear comfortable clothing and clothing appropriate for easy access to any Portacath or PICC line.   We strive to give you quality time with your provider. You may need to reschedule your appointment if you arrive late (15 or more minutes).  Arriving late affects you and other patients whose appointments are after yours.  Also, if you miss three or more appointments without notifying the office, you may be dismissed from the clinic at the provider's discretion.      For prescription refill requests, have your pharmacy contact our office and allow 72 hours for refills to be completed.    Today you received the following chemotherapy and/or immunotherapy agents Velcade.      To help prevent nausea and vomiting after your treatment, we encourage you to take your nausea medication as directed.  BELOW ARE SYMPTOMS THAT SHOULD BE REPORTED IMMEDIATELY: *FEVER GREATER THAN 100.4 F (38 C) OR HIGHER *CHILLS OR SWEATING *NAUSEA AND VOMITING THAT IS NOT CONTROLLED WITH YOUR NAUSEA MEDICATION *UNUSUAL SHORTNESS OF BREATH *UNUSUAL BRUISING OR BLEEDING *URINARY PROBLEMS (pain or burning when urinating, or frequent urination) *BOWEL PROBLEMS (unusual diarrhea, constipation, pain near the anus) TENDERNESS IN MOUTH AND THROAT WITH OR WITHOUT PRESENCE OF ULCERS (sore throat, sores in mouth, or a toothache) UNUSUAL RASH, SWELLING OR PAIN  UNUSUAL VAGINAL DISCHARGE OR ITCHING   Items with * indicate a potential emergency and should be followed up as soon as possible or go to the Emergency Department if any problems should occur.  Please show the CHEMOTHERAPY ALERT CARD or IMMUNOTHERAPY ALERT CARD at check-in to  the Emergency Department and triage nurse.  Should you have questions after your visit or need to cancel or reschedule your appointment, please contact  [facility] [place] [facility]  [phone] and follow the prompts.  Office hours are 8:00 a.m. to 4:30 p.m. Monday - Friday. Please note that voicemails left after 4:00 p.m. may not be returned until the following business day.  We are closed weekends and major holidays. You have access to a nurse at all times for urgent questions. Please call the main number to the clinic [phone] and follow the prompts.  For any non-urgent questions, you may also contact your provider using MyChart. We now offer e-Visits for anyone 18 and older to request care online for non-urgent symptoms. For details visit [email or web address].   Also download the MyChart app! Go to the app store, search "MyChart", open the app, select , and log in with your MyChart username and password.    

## 2023-04-25 NOTE — Progress Notes (Signed)
Clinical Pharmacist Practitioner Clinic [facility]  [phone] [phone]  Patient Care Team: Danella Penton, MD as PCP - General (Internal Medicine) [name], MD as Consulting Physician (Oncology)   Name of the patient: Victor Phillips  [id number]  03/12/1956   Date of visit: 04/25/23  HPI: Patient is a 67 y.o. male with IgA lambda multiple myeloma. Plan treatment with lenalidomide, daratumumab, bortezomib, and dexamethasone. Patient started C1 of daratumumab, bortezomib, and dexamethasone on 04/22/23. Lenalidomide will be added on to C1 on 04/29/23.   Reason for Consult: Lenalidomide oral chemotherapy education.   PAST MEDICAL HISTORY: Past Medical History:  Diagnosis Date   Diabetes mellitus without complication (HCC)    Hypertension     HEMATOLOGY/ONCOLOGY HISTORY:  Oncology History  Multiple myeloma (HCC)  04/14/2023 Initial Diagnosis   Multiple myeloma   03/13/2020 multiple myeloma panel showed M protein 4.3, IgA lambda Free lamda Level 142,-light chain ratio 0.07 04/05/2023 bone marrow biopsy showed hypercellular bone marrow with plasma cell neoplasm.The bone marrow is hypercellular for age with prominent increase in plasma cells representing 65% of all cells in the aspirate associated with interstitial infiltrates and numerous variably sized aggregates in  the clot and biopsy sections.  The plasma cells display lambda light chain restriction consistent with plasma cell neoplasm.  Normal cytogenetics.  FISH studies pending.    04/14/2023 Cancer Staging   Staging form: Plasma Cell Myeloma and Plasma Cell Disorders, AJCC 8th Edition - Clinical stage from 04/14/2023: RISS Stage II (Beta-2-microglobulin (mg/L): 8.4, Albumin (g/dL): 2.6, ISS: Stage III, High-risk cytogenetics: Absent, LDH: Normal) - Signed by [name], MD on 04/22/2023 Stage prefix: Initial diagnosis Beta 2 microglobulin range (mg/L): Greater than or equal to 5.5 Albumin range  (g/dL): Less than 3.5 Cytogenetics: 1q addition, Other mutation   04/14/2023 Imaging   Skeletal survey 1. No lytic lesions or other intrinsic bony abnormality. 2. Borderline cardiomegaly with an interval decrease in size. 3. Diffuse atheromatous arterial calcifications including bilateral carotid artery calcifications, right greater than left   04/22/2023 -  Chemotherapy   Patient is on Treatment Plan : MYELOMA NEWLY DIAGNOSED TRANSPLANT CANDIDATE DaraVRd (Daratumumab SQ) q21d x 6 Cycles (Induction/Consolidation)       ALLERGIES:  has No Known Allergies.  MEDICATIONS:  Current Outpatient Medications  Medication Sig Dispense Refill   acetaminophen (TYLENOL) 325 MG tablet Take 2 tablets (650 mg total) by mouth every 6 (six) hours as needed for mild pain (or Fever >/= 101). 90 tablet 1   acyclovir (ZOVIRAX) 400 MG tablet Take 1 tablet (400 mg total) by mouth 2 (two) times daily. 60 tablet 5   aspirin EC 81 MG tablet Take 1 tablet (81 mg total) by mouth daily. 100 tablet 3   atorvastatin (LIPITOR) 10 MG tablet Take 1 tablet by mouth daily.     Fe Fum-Vit C-Vit B12-FA (TRIGELS-F FORTE) CAPS capsule Take 1 capsule by mouth 2 (two) times daily. (Patient not taking: Reported on 04/05/2023) 60 capsule 3   glipiZIDE (GLUCOTROL XL) 2.5 MG 24 hr tablet Take by mouth.     lenalidomide (REVLIMID) 10 MG capsule Take 1 capsule (10 mg total) by mouth daily. Take for 7 days, then hold for 7 days off. 7 capsule 0   Multiple Vitamin (MULTIVITAMIN WITH MINERALS) TABS tablet Take 1 tablet by mouth daily. 90 tablet 1   olmesartan-hydrochlorothiazide (BENICAR HCT) 20-12.5 MG tablet Take 1 tablet by mouth daily.     ondansetron (ZOFRAN) 8 MG tablet Take  1 tablet (8 mg total) by mouth every 8 (eight) hours as needed for nausea or vomiting. 30 tablet 1   prochlorperazine (COMPAZINE) 10 MG tablet Take 1 tablet (10 mg total) by mouth every 6 (six) hours as needed for nausea or vomiting. 30 tablet 1   No current  facility-administered medications for this visit.    VITAL SIGNS: There were no vitals taken for this visit. There were no vitals filed for this visit.  Estimated body mass index is 27.11 kg/m as calculated from the following:   Height as of 04/13/23: 5\' 7"  (1.702 m).   Weight as of 04/22/23: 78.5 kg (173 lb 1.6 oz).  LABS: CBC:    Component Value Date/Time   WBC 4.5 04/22/2023 0835   WBC 5.9 04/13/2023 1234   HGB 7.8 (L) 04/22/2023 0835   HCT 24.4 (L) 04/22/2023 0835   PLT 133 (L) 04/22/2023 0835   MCV 104.7 (H) 04/22/2023 0835   NEUTROABS 2.9 04/22/2023 0835   LYMPHSABS 1.0 04/22/2023 0835   MONOABS 0.4 04/22/2023 0835   EOSABS 0.1 04/22/2023 0835   BASOSABS 0.0 04/22/2023 0835   Comprehensive Metabolic Panel:    Component Value Date/Time   NA 133 (L) 04/22/2023 0835   K 5.0 04/22/2023 0835   CL 106 04/22/2023 0835   CO2 20 (L) 04/22/2023 0835   BUN 48 (H) 04/22/2023 0835   CREATININE 1.66 (H) 04/22/2023 0835   GLUCOSE 195 (H) 04/22/2023 0835   CALCIUM 8.9 04/22/2023 0835   AST 35 04/22/2023 0835   ALT 62 (H) 04/22/2023 0835   ALKPHOS 91 04/22/2023 0835   BILITOT 0.4 04/22/2023 0835   PROT 11.0 (H) 04/22/2023 0835   ALBUMIN 2.6 (L) 04/22/2023 0835     Present during today's visit: Patient and his wife, seen in infusion   Start plan: pending medication delivery patient will start lenalidomide on 04/29/23.    Patient Education I spoke with patient for overview of new oral chemotherapy medication: lenalidomide    Administration: Counseled patient on administration, dosing, side effects, monitoring, drug-food interactions, safe handling, storage, and disposal. Patient will take 1 capsule (10 mg total) by mouth daily. Take for 14 days, then hold for 7 days off.   Patient will also take aspirin 81 mg daily.   Side Effects: Side effects include but not limited to: rash/itchy skin, N/V, fatigue, decreased wbc/hgb/plt, constipation or diarrhea.  Rash: Patient knows  to contact the office if he develops a rash.    Drug-drug Interactions (DDI): No current DDIs with lenalidomide.   Adherence: After discussion with patient no patient barriers to medication adherence identified.  Reviewed with patient importance of keeping a medication schedule and plan for any missed doses.  The [facility] voiced understanding and appreciation. All questions answered. Medication handout provided.  Provided patient with Oral Chemotherapy Navigation Clinic phone number. Patient knows to call the office with questions or concerns. Oral Chemotherapy Navigation Clinic will continue to follow.  Patient expressed understanding and was in agreement with this plan. He also understands that He can call clinic at any time with any questions, concerns, or complaints.   Medication Access Issues: Pending delivery from [facility]  Follow-up plan: RTC as scheduled   Thank you for allowing me to participate in the care of this patient.   Time Total: 20 minutes   Visit consisted of counseling and education on dealing with issues of symptom management in the setting of serious and potentially life-threatening illness.Greater than 50%  of  this time was spent counseling and coordinating care related to the above assessment and plan.  Signed by: Remi Haggard, PharmD, BCPS, Nolon Bussing, CPP Hematology/Oncology Clinical Pharmacist Practitioner [facility] [phone]  04/25/2023 9:05 AM

## 2023-04-27 NOTE — Telephone Encounter (Signed)
Per [facility] portal, lenalidomide dispensed by [facility] on 04/27/23

## 2023-04-29 ENCOUNTER — Inpatient Hospital Stay (HOSPITAL_BASED_OUTPATIENT_CLINIC_OR_DEPARTMENT_OTHER): Payer: Medicare HMO | Admitting: Oncology

## 2023-04-29 ENCOUNTER — Other Ambulatory Visit: Payer: Self-pay | Admitting: Oncology

## 2023-04-29 ENCOUNTER — Other Ambulatory Visit: Payer: Self-pay

## 2023-04-29 ENCOUNTER — Inpatient Hospital Stay: Payer: Medicare HMO

## 2023-04-29 ENCOUNTER — Encounter: Payer: Self-pay | Admitting: Oncology

## 2023-04-29 VITALS — BP 149/87 | HR 93 | Temp 97.3°F | Resp 18 | Wt 175.4 lb

## 2023-04-29 DIAGNOSIS — Z5111 Encounter for antineoplastic chemotherapy: Secondary | ICD-10-CM

## 2023-04-29 DIAGNOSIS — C9000 Multiple myeloma not having achieved remission: Secondary | ICD-10-CM

## 2023-04-29 DIAGNOSIS — D649 Anemia, unspecified: Secondary | ICD-10-CM

## 2023-04-29 DIAGNOSIS — N1832 Chronic kidney disease, stage 3b: Secondary | ICD-10-CM | POA: Diagnosis not present

## 2023-04-29 DIAGNOSIS — Z5112 Encounter for antineoplastic immunotherapy: Secondary | ICD-10-CM | POA: Diagnosis not present

## 2023-04-29 DIAGNOSIS — E119 Type 2 diabetes mellitus without complications: Secondary | ICD-10-CM

## 2023-04-29 LAB — CBC WITH DIFFERENTIAL (CANCER CENTER ONLY)
Abs Immature Granulocytes: 0.03 10*3/uL (ref 0.00–0.07)
Basophils Absolute: 0 10*3/uL (ref 0.0–0.1)
Basophils Relative: 0 %
Eosinophils Absolute: 0.1 10*3/uL (ref 0.0–0.5)
Eosinophils Relative: 2 %
HCT: 26 % — ABNORMAL LOW (ref 39.0–52.0)
Hemoglobin: 8.3 g/dL — ABNORMAL LOW (ref 13.0–17.0)
Immature Granulocytes: 1 %
Lymphocytes Relative: 17 %
Lymphs Abs: 0.9 10*3/uL (ref 0.7–4.0)
MCH: 32.9 pg (ref 26.0–34.0)
MCHC: 31.9 g/dL (ref 30.0–36.0)
MCV: 103.2 fL — ABNORMAL HIGH (ref 80.0–100.0)
Monocytes Absolute: 0.4 10*3/uL (ref 0.1–1.0)
Monocytes Relative: 8 %
Neutro Abs: 3.7 10*3/uL (ref 1.7–7.7)
Neutrophils Relative %: 72 %
Platelet Count: 126 10*3/uL — ABNORMAL LOW (ref 150–400)
RBC: 2.52 MIL/uL — ABNORMAL LOW (ref 4.22–5.81)
RDW: 17.2 % — ABNORMAL HIGH (ref 11.5–15.5)
WBC Count: 5.1 10*3/uL (ref 4.0–10.5)
nRBC: 0 % (ref 0.0–0.2)

## 2023-04-29 LAB — CMP (CANCER CENTER ONLY)
ALT: 70 U/L — ABNORMAL HIGH (ref 0–44)
AST: 33 U/L (ref 15–41)
Albumin: 2.6 g/dL — ABNORMAL LOW (ref 3.5–5.0)
Alkaline Phosphatase: 98 U/L (ref 38–126)
Anion gap: 7 (ref 5–15)
BUN: 39 mg/dL — ABNORMAL HIGH (ref 8–23)
CO2: 20 mmol/L — ABNORMAL LOW (ref 22–32)
Calcium: 8.9 mg/dL (ref 8.9–10.3)
Chloride: 107 mmol/L (ref 98–111)
Creatinine: 1.44 mg/dL — ABNORMAL HIGH (ref 0.61–1.24)
GFR, Estimated: 54 mL/min — ABNORMAL LOW (ref >60)
Glucose, Bld: 144 mg/dL — ABNORMAL HIGH (ref 70–99)
Potassium: 4.7 mmol/L (ref 3.5–5.1)
Sodium: 134 mmol/L — ABNORMAL LOW (ref 135–145)
Total Bilirubin: 0.4 mg/dL (ref 0.3–1.2)
Total Protein: 10.3 g/dL — ABNORMAL HIGH (ref 6.5–8.1)

## 2023-04-29 LAB — PREPARE RBC (CROSSMATCH)

## 2023-04-29 LAB — SAMPLE TO BLOOD BANK

## 2023-04-29 MED ORDER — BORTEZOMIB CHEMO SQ INJECTION 3.5 MG (2.5MG/ML)
1.3000 mg/m2 | Freq: Once | INTRAMUSCULAR | Status: AC
  Administered 2023-04-29: 2.5 mg via SUBCUTANEOUS
  Filled 2023-04-29: qty 1

## 2023-04-29 MED ORDER — ACETAMINOPHEN 325 MG PO TABS
650.0000 mg | ORAL_TABLET | Freq: Once | ORAL | Status: AC
  Administered 2023-04-29: 650 mg via ORAL
  Filled 2023-04-29: qty 2

## 2023-04-29 MED ORDER — DEXAMETHASONE 4 MG PO TABS
20.0000 mg | ORAL_TABLET | Freq: Once | ORAL | Status: AC
  Administered 2023-04-29: 20 mg via ORAL
  Filled 2023-04-29: qty 5

## 2023-04-29 MED ORDER — DIPHENHYDRAMINE HCL 25 MG PO CAPS
50.0000 mg | ORAL_CAPSULE | Freq: Once | ORAL | Status: AC
  Administered 2023-04-29: 50 mg via ORAL
  Filled 2023-04-29: qty 2

## 2023-04-29 MED ORDER — SENNA 8.6 MG PO TABS: 2.0000 | ORAL_TABLET | Freq: Every day | ORAL | 0 refills | Status: DC

## 2023-04-29 MED ORDER — MONTELUKAST SODIUM 10 MG PO TABS
10.0000 mg | ORAL_TABLET | Freq: Once | ORAL | Status: AC
  Administered 2023-04-29: 10 mg via ORAL
  Filled 2023-04-29: qty 1

## 2023-04-29 MED ORDER — DARATUMUMAB-HYALURONIDASE-FIHJ 1800-30000 MG-UT/15ML ~~LOC~~ SOLN
1800.0000 mg | Freq: Once | SUBCUTANEOUS | Status: AC
  Administered 2023-04-29: 1800 mg via SUBCUTANEOUS
  Filled 2023-04-29: qty 15

## 2023-04-29 NOTE — Addendum Note (Signed)
Addended by: [name] on: 04/29/2023 11:27 AM   Modules accepted: Orders

## 2023-04-29 NOTE — Assessment & Plan Note (Signed)
Anticipate that his hemoglobin will further decrease with the start of Revlimid.  Plan 1 unit of PRBC transfusion next week.

## 2023-04-29 NOTE — Assessment & Plan Note (Signed)
 Possibly due to myeloma.  Encourage oral hydration and avoid nephrotoxins.

## 2023-04-29 NOTE — Patient Instructions (Signed)

## 2023-04-29 NOTE — Assessment & Plan Note (Addendum)
Bone marrow biopsy was reviewed. 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 Proceed with C1 D8 Daratumumab and Velcade today. He gets Dexamethasone 20mg  weekly prior to Daratumumab treatments.  Continue Acyclovir, Aspirin 81mg  recommendation. He has renal insufficiency, Revlimid 10mg  till Day 42

## 2023-04-29 NOTE — Progress Notes (Signed)
Hematology/Oncology Progress note [phone] [phone]         Patient Care Team: Danella Penton, MD as PCP - General (Internal Medicine) [name], MD as Consulting Physician (Oncology)   REFERRING PROVIDER: [name], MD  CHIEF COMPLAINTS/REASON FOR VISIT:  Anemia  ASSESSMENT & PLAN:   Cancer Staging  Multiple myeloma [facility]) Staging form: Plasma Cell Myeloma and Plasma Cell Disorders, AJCC 8th Edition - Clinical stage from 04/14/2023: RISS Stage II (Beta-2-microglobulin (mg/L): 8.4, Albumin (g/dL): 2.6, ISS: Stage III, High-risk cytogenetics: Absent, LDH: Normal) - Signed by [name], MD on 04/22/2023   Multiple myeloma (HCC) Bone marrow biopsy was reviewed. 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 Proceed with C1 D8 Daratumumab and Velcade today. He gets Dexamethasone 20mg  weekly prior to Daratumumab treatments.  Continue Acyclovir, Aspirin 81mg  recommendation. He has renal insufficiency, Revlimid 10mg  till Day 14   Type 2 diabetes mellitus (HCC) He is off metformin. Started on glipizide  Recommend diabetic diet.    CKD (chronic kidney disease) stage 3, GFR 30-59 ml/min (HCC) Possibly due to myeloma.  Encourage oral hydration and avoid nephrotoxins.    Encounter for antineoplastic chemotherapy Chemotherapy plan as listed above.   Discussed about antiemetics instruction.  Symptomatic anemia Anticipate that his hemoglobin will further decrease with the start of Revlimid.  Plan 1 unit of PRBC transfusion next week.    Orders Placed This Encounter  Procedures   Sample to Blood Bank    Standing Status:   Future    Standing Expiration Date:   04/28/2024   Type and screen    Standing Status:   Future    Number of Occurrences:   1    Standing Expiration Date:   04/28/2024   Follow-up  per LOS  We spent sufficient time to discuss many  aspect of care, questions were answered to patient's satisfaction.    [name], MD, PhD [facility] Hematology Oncology 04/29/2023     HISTORY OF PRESENTING ILLNESS:  Victor Phillips is a  67 y.o.  male with PMH listed below who was referred to me for anemia  02/11/2023 - 02/14/2023 recent hospitalization due to pneumonia, respiratory failure.  He was found to have a hemoglobin decreased at 6.8, status post PRBC transfusion during admission.  EGD showed gastritis.  Colonoscopy was not remarkable. 02/11/2023 TIBC 221 ferritin 107, iron saturation 16. Patient is currently taking fusion plus Vitamin B12 level in the 300s. His echo showed grade 2 diastolic CHF.  He denies recent chest pain on exertion, shortness of breath on minimal exertion, pre-syncopal episodes, or palpitations He had not noticed any recent bleeding such as epistaxis, hematuria or hematochezia.  He denies over the counter NSAID ingestion.  Oncology History  Multiple myeloma (HCC)  04/14/2023 Initial Diagnosis   Multiple myeloma   03/13/2020 multiple myeloma panel showed M protein 4.3, IgA lambda Free lamda Level 142,-light chain ratio 0.07 04/05/2023 bone marrow biopsy showed hypercellular bone marrow with plasma cell neoplasm.The bone marrow is hypercellular for age with prominent increase in plasma cells representing 65% of all cells in the aspirate associated with interstitial infiltrates and numerous variably sized aggregates in  the clot and biopsy sections.  The plasma cells display lambda light chain restriction consistent with plasma cell neoplasm.  Normal cytogenetics.  FISH studies pending.    04/14/2023 Cancer Staging   Staging form: Plasma Cell  Myeloma and Plasma Cell Disorders, AJCC 8th Edition - Clinical stage from 04/14/2023: RISS Stage II (Beta-2-microglobulin (mg/L): 8.4, Albumin (g/dL): 2.6, ISS: Stage III, High-risk cytogenetics: Absent, LDH: Normal) - Signed by [name], MD on 04/22/2023 Stage prefix: Initial  diagnosis Beta 2 microglobulin range (mg/L): Greater than or equal to 5.5 Albumin range (g/dL): Less than 3.5 Cytogenetics: 1q addition, Other mutation   04/14/2023 Imaging   Skeletal survey 1. No lytic lesions or other intrinsic bony abnormality. 2. Borderline cardiomegaly with an interval decrease in size. 3. Diffuse atheromatous arterial calcifications including bilateral carotid artery calcifications, right greater than left   04/22/2023 -  Chemotherapy   Patient is on Treatment Plan : MYELOMA NEWLY DIAGNOSED TRANSPLANT CANDIDATE DaraVRd (Daratumumab SQ) q21d x 6 Cycles (Induction/Consolidation)      He has not picked up antiemetics, on  Aspirin, Acyclovir  He reports to be complaint with his medication. Glipizide for DM.  No new complaints. + fatigue.    MEDICAL HISTORY:  Past Medical History:  Diagnosis Date   Diabetes mellitus without complication (HCC)    Hypertension     SURGICAL HISTORY: Past Surgical History:  Procedure Laterality Date   COLONOSCOPY N/A 02/13/2023   Procedure: COLONOSCOPY;  Surgeon: Toledo, Boykin Nearing, MD;  Location: [facility] ENDOSCOPY;  Service: Gastroenterology;  Laterality: N/A;   COLONOSCOPY N/A 02/14/2023   Procedure: COLONOSCOPY;  Surgeon: Toledo, Boykin Nearing, MD;  Location: [facility] ENDOSCOPY;  Service: Gastroenterology;  Laterality: N/A;   ESOPHAGOGASTRODUODENOSCOPY N/A 02/13/2023   Procedure: ESOPHAGOGASTRODUODENOSCOPY (EGD);  Surgeon: Toledo, Boykin Nearing, MD;  Location: [facility] ENDOSCOPY;  Service: Gastroenterology;  Laterality: N/A;    SOCIAL HISTORY: Social History   Socioeconomic History   Marital status: Married    Spouse name: Not on file   Number of children: Not on file   Years of education: Not on file   Highest education level: Not on file  Occupational History   Not on file  Tobacco Use   Smoking status: Never   Smokeless tobacco: Never  Vaping Use   Vaping status: Never Used  Substance and Sexual Activity   Alcohol use: Not Currently     Comment: quit approx 2001   Drug use: No   Sexual activity: Not on file  Other Topics Concern   Not on file  Social History Narrative   Not on file   Social Determinants of Health   Financial Resource Strain: Low Risk  (02/03/2023)   Received from [facility], [facility]   Overall Financial Resource Strain (CARDIA)    Difficulty of Paying Living Expenses: Not very hard  Food Insecurity: No Food Insecurity (03/14/2023)   Hunger Vital Sign    Worried About Running Out of Food in the Last Year: Never true    Ran Out of Food in the Last Year: Never true  Transportation Needs: No Transportation Needs (03/14/2023)   PRAPARE - Administrator, Civil Service (Medical): No    Lack of Transportation (Non-Medical): No  Physical Activity: Sufficiently Active (09/24/2020)   Received from [facility], [facility]   Exercise Vital Sign    Days of Exercise per Week: 6 days    Minutes of Exercise per Session: 30 min  Stress: No Stress Concern Present (09/24/2020)   Received from [facility], [facility]   [employer] of Occupational Health - Occupational Stress Questionnaire    Feeling of Stress : Not at all  Social Connections: Socially Integrated (09/24/2020)   Received from [facility], [facility]   Social Connection and Isolation Panel [NHANES]    Frequency of Communication with Friends and Family: More than three times a week    Frequency of Social Gatherings with Friends and Family: Once a week    Attends Religious Services: More than 4 times per year    Active Member of [employer] or Organizations: Yes    Attends Engineer, structural: More than 4 times per year    Marital Status: Married  Catering manager Violence: Not At Risk (03/14/2023)   Humiliation, Afraid, Rape, and Kick questionnaire    Fear of Current or Ex-Partner: No     Emotionally Abused: No    Physically Abused: No    Sexually Abused: No    FAMILY HISTORY: Family History  Problem Relation Age of Onset   Diabetes Mother    Heart attack Father     ALLERGIES:  has No Known Allergies.  MEDICATIONS:  Current Outpatient Medications  Medication Sig Dispense Refill   acetaminophen (TYLENOL) 325 MG tablet Take 2 tablets (650 mg total) by mouth every 6 (six) hours as needed for mild pain (or Fever >/= 101). 90 tablet 1   acyclovir (ZOVIRAX) 400 MG tablet Take 1 tablet (400 mg total) by mouth 2 (two) times daily. 60 tablet 5   aspirin EC 81 MG tablet Take 1 tablet (81 mg total) by mouth daily. 100 tablet 3   atorvastatin (LIPITOR) 10 MG tablet Take 1 tablet by mouth daily.     glipiZIDE (GLUCOTROL XL) 2.5 MG 24 hr tablet Take by mouth.     lenalidomide (REVLIMID) 10 MG capsule Take 1 capsule (10 mg total) by mouth daily. Take for 7 days, then hold for 7 days off. 7 capsule 0   Multiple Vitamin (MULTIVITAMIN WITH MINERALS) TABS tablet Take 1 tablet by mouth daily. 90 tablet 1   olmesartan-hydrochlorothiazide (BENICAR HCT) 20-12.5 MG tablet Take 1 tablet by mouth daily.     ondansetron (ZOFRAN) 8 MG tablet Take 1 tablet (8 mg total) by mouth every 8 (eight) hours as needed for nausea or vomiting. 30 tablet 1   prochlorperazine (COMPAZINE) 10 MG tablet Take 1 tablet (10 mg total) by mouth every 6 (six) hours as needed for nausea or vomiting. 30 tablet 1   Fe Fum-Vit C-Vit B12-FA (TRIGELS-F FORTE) CAPS capsule Take 1 capsule by mouth 2 (two) times daily. (Patient not taking: Reported on 04/05/2023) 60 capsule 3   No current facility-administered medications for this visit.   Facility-Administered Medications Ordered in Other Visits  Medication Dose Route Frequency Provider Last Rate Last Admin   acetaminophen (TYLENOL) tablet 650 mg  650 mg Oral Once [name], MD       bortezomib SQ (VELCADE) chemo injection (2.5mg /mL concentration) 2.5 mg  1.3 mg/m2 (Treatment  Plan Recorded) Subcutaneous Once [name], MD       daratumumab-hyaluronidase-fihj [facility] FASPRO) 1800-30000 MG-UT/15ML chemo SQ injection 1,800 mg  1,800 mg Subcutaneous Once [name], MD       dexamethasone (DECADRON) tablet 20 mg  20 mg Oral Once [name], MD       diphenhydrAMINE (BENADRYL) capsule 50 mg  50 mg Oral Once [name], MD       montelukast (SINGULAIR) tablet 10 mg  10 mg Oral Once [name], MD        Review of Systems  Constitutional:  Positive for fatigue.  Negative for appetite change, chills, fever and unexpected weight change.  HENT:   Negative for hearing loss and voice change.   Eyes:  Negative for eye problems and icterus.  Respiratory:  Negative for chest tightness, cough and shortness of breath.   Cardiovascular:  Negative for chest pain and leg swelling.  Gastrointestinal:  Negative for abdominal distention and abdominal pain.  Endocrine: Negative for hot flashes.  Genitourinary:  Negative for difficulty urinating, dysuria and frequency.   Musculoskeletal:  Negative for arthralgias.  Skin:  Negative for itching and rash.  Neurological:  Negative for light-headedness and numbness.  Hematological:  Negative for adenopathy. Does not bruise/bleed easily.  Psychiatric/Behavioral:  Negative for confusion.     PHYSICAL EXAMINATION: Vitals:   04/29/23 0854  BP: (!) 149/87  Pulse: 93  Resp: 18  Temp: (!) 97.3 F (36.3 C)   Filed Weights   04/29/23 0854  Weight: 175 lb 6.4 oz (79.6 kg)    Physical Exam Constitutional:      General: He is not in acute distress. HENT:     Head: Normocephalic and atraumatic.  Eyes:     General: No scleral icterus. Cardiovascular:     Rate and Rhythm: Normal rate and regular rhythm.     Heart sounds: Normal heart sounds.  Pulmonary:     Effort: Pulmonary effort is normal. No respiratory distress.     Breath sounds: No wheezing.  Abdominal:     General: Bowel sounds are normal. There is no distension.     Palpations:  Abdomen is soft.  Musculoskeletal:        General: No deformity. Normal range of motion.     Cervical back: Normal range of motion and neck supple.  Skin:    General: Skin is warm and dry.     Findings: No erythema or rash.  Neurological:     Mental Status: He is alert and oriented to person, place, and time. Mental status is at baseline.     Cranial Nerves: No cranial nerve deficit.     Coordination: Coordination normal.  Psychiatric:        Mood and Affect: Mood normal.      LABORATORY DATA:  I have reviewed the data as listed    Latest Ref Rng & Units 04/29/2023    8:26 AM 04/22/2023    8:35 AM 04/14/2023   10:28 AM  CBC  WBC 4.0 - 10.5 K/uL 5.1  4.5  6.1   Hemoglobin 13.0 - 17.0 g/dL 8.3  7.8  7.6   Hematocrit 39.0 - 52.0 % 26.0  24.4  23.3   Platelets 150 - 400 K/uL 126  133  145       Latest Ref Rng & Units 04/29/2023    8:26 AM 04/22/2023    8:35 AM 04/13/2023   12:34 PM  CMP  Glucose 70 - 99 mg/dL 119  147  829   BUN 8 - 23 mg/dL 39  48  43   Creatinine 0.61 - 1.24 mg/dL 5.62  1.30  8.65   Sodium 135 - 145 mmol/L 134  133  131   Potassium 3.5 - 5.1 mmol/L 4.7  5.0  5.2   Chloride 98 - 111 mmol/L 107  106  100   CO2 22 - 32 mmol/L 20  20  26    Calcium 8.9 - 10.3 mg/dL 8.9  8.9  8.8   Total Protein 6.5 - 8.1 g/dL 78.4  69.6  29.5  Total Bilirubin 0.3 - 1.2 mg/dL 0.4  0.4  0.4   Alkaline Phos 38 - 126 U/L 98  91  94   AST 15 - 41 U/L 33  35  38   ALT 0 - 44 U/L 70  62  58    Lab Results  Component Value Date   IRON 100 03/14/2023   TIBC 272 03/14/2023   IRONPCTSAT 37 03/14/2023   FERRITIN 249 03/14/2023     RADIOGRAPHIC STUDIES: I have personally reviewed the radiological images as listed and agreed with the findings in the report. DG Bone Survey Met  Result Date: 04/21/2023 CLINICAL DATA:  Macrocytic anemia. EXAM: METASTATIC BONE SURVEY COMPARISON:  Chest radiograph dated 02/13/2023. Chest CTA and abdomen and pelvis CT dated 02/11/2023. FINDINGS: Borderline  enlarged cardiac silhouette with an interval decrease in size. Cervical, thoracic and lumbar spine degenerative changes. Right greater than left neck calcifications. Old, healed left clavicle fracture. Moderate right and mild left acromioclavicular joint degenerative changes. Diffuse atheromatous arterial calcifications. No lytic lesions or other intrinsic bony abnormality. IMPRESSION: 1. No lytic lesions or other intrinsic bony abnormality. 2. Borderline cardiomegaly with an interval decrease in size. 3. Diffuse atheromatous arterial calcifications including bilateral carotid artery calcifications, right greater than left. Electronically Signed   By: Beckie Salts M.D.   On: 04/21/2023 11:49   CT BONE MARROW BIOPSY & ASPIRATION  Result Date: 04/05/2023 CLINICAL DATA:  Macrocytic anemia and elevated lambda free light chains. Bone marrow biopsy required for further workup. EXAM: CT GUIDED BONE MARROW ASPIRATION AND BIOPSY ANESTHESIA/SEDATION: Moderate (conscious) sedation was employed during this procedure. A total of Versed 1.0 mg and Fentanyl 50 mcg was administered intravenously by radiology nursing. Moderate Sedation Time: 18 minutes. The patient's level of consciousness and vital signs were monitored continuously by radiology nursing throughout the procedure under my direct supervision. PROCEDURE: The procedure risks, benefits, and alternatives were explained to the patient. Questions regarding the procedure were encouraged and answered. The patient understands and consents to the procedure. A time out was performed prior to initiating the procedure. The right gluteal region was prepped with chlorhexidine. Sterile gown and sterile gloves were used for the procedure. Local anesthesia was provided with 1% Lidocaine. Under CT guidance, an 11 gauge On Control bone cutting needle was advanced from a posterior approach into the right iliac bone. Needle positioning was confirmed with CT. Initial non heparinized and  heparinized aspirate samples were obtained of bone marrow. Core biopsy was performed via the On Control drill needle. COMPLICATIONS: None FINDINGS: Inspection of initial aspirate did reveal visible particles. Intact core biopsy sample was obtained. IMPRESSION: CT guided bone marrow biopsy of right posterior iliac bone with both aspirate and core samples obtained. Electronically Signed   By: Irish Lack M.D.   On: 04/05/2023 11:31

## 2023-04-29 NOTE — Assessment & Plan Note (Signed)
 He is off metformin. Started on glipizide  Recommend diabetic diet.

## 2023-04-29 NOTE — Assessment & Plan Note (Signed)
 Chemotherapy plan as listed above.   Discussed about antiemetics instruction.

## 2023-05-02 ENCOUNTER — Inpatient Hospital Stay: Payer: Medicare HMO

## 2023-05-02 ENCOUNTER — Ambulatory Visit: Payer: Medicare HMO

## 2023-05-02 ENCOUNTER — Encounter: Payer: Self-pay | Admitting: Oncology

## 2023-05-02 VITALS — BP 158/85 | HR 80 | Temp 96.8°F | Resp 18 | Wt 178.1 lb

## 2023-05-02 DIAGNOSIS — C9000 Multiple myeloma not having achieved remission: Secondary | ICD-10-CM

## 2023-05-02 DIAGNOSIS — Z5112 Encounter for antineoplastic immunotherapy: Secondary | ICD-10-CM | POA: Diagnosis not present

## 2023-05-02 DIAGNOSIS — D649 Anemia, unspecified: Secondary | ICD-10-CM

## 2023-05-02 MED ORDER — PROCHLORPERAZINE MALEATE 10 MG PO TABS
10.0000 mg | ORAL_TABLET | Freq: Once | ORAL | Status: AC
  Administered 2023-05-02: 10 mg via ORAL
  Filled 2023-05-02: qty 1

## 2023-05-02 MED ORDER — DIPHENHYDRAMINE HCL 25 MG PO CAPS
25.0000 mg | ORAL_CAPSULE | Freq: Once | ORAL | Status: AC
  Administered 2023-05-02: 25 mg via ORAL
  Filled 2023-05-02: qty 1

## 2023-05-02 MED ORDER — BORTEZOMIB CHEMO SQ INJECTION 3.5 MG (2.5MG/ML)
1.3000 mg/m2 | Freq: Once | INTRAMUSCULAR | Status: AC
  Administered 2023-05-02: 2.5 mg via SUBCUTANEOUS
  Filled 2023-05-02: qty 1

## 2023-05-02 MED ORDER — SODIUM CHLORIDE 0.9% IV SOLUTION
250.0000 mL | Freq: Once | INTRAVENOUS | Status: AC
  Administered 2023-05-02: 250 mL via INTRAVENOUS
  Filled 2023-05-02: qty 250

## 2023-05-02 MED ORDER — ACETAMINOPHEN 325 MG PO TABS
650.0000 mg | ORAL_TABLET | Freq: Once | ORAL | Status: AC
  Administered 2023-05-02: 650 mg via ORAL
  Filled 2023-05-02: qty 2

## 2023-05-02 NOTE — Patient Instructions (Signed)
[facility] [facility]  Discharge Instructions: Thank you for choosing [facility] to provide your oncology and hematology care.  If you have a lab appointment with the Cancer Center, please go directly to the Cancer Center and check in at the registration area.  Wear comfortable clothing and clothing appropriate for easy access to any Portacath or PICC line.   We strive to give you quality time with your provider. You may need to reschedule your appointment if you arrive late (15 or more minutes).  Arriving late affects you and other patients whose appointments are after yours.  Also, if you miss three or more appointments without notifying the office, you may be dismissed from the clinic at the provider's discretion.      For prescription refill requests, have your pharmacy contact our office and allow 72 hours for refills to be completed.    Today you received the following chemotherapy and/or immunotherapy agents velcade      To help prevent nausea and vomiting after your treatment, we encourage you to take your nausea medication as directed.  BELOW ARE SYMPTOMS THAT SHOULD BE REPORTED IMMEDIATELY: *FEVER GREATER THAN 100.4 F (38 C) OR HIGHER *CHILLS OR SWEATING *NAUSEA AND VOMITING THAT IS NOT CONTROLLED WITH YOUR NAUSEA MEDICATION *UNUSUAL SHORTNESS OF BREATH *UNUSUAL BRUISING OR BLEEDING *URINARY PROBLEMS (pain or burning when urinating, or frequent urination) *BOWEL PROBLEMS (unusual diarrhea, constipation, pain near the anus) TENDERNESS IN MOUTH AND THROAT WITH OR WITHOUT PRESENCE OF ULCERS (sore throat, sores in mouth, or a toothache) UNUSUAL RASH, SWELLING OR PAIN  UNUSUAL VAGINAL DISCHARGE OR ITCHING   Items with * indicate a potential emergency and should be followed up as soon as possible or go to the Emergency Department if any problems should occur.  Please show the CHEMOTHERAPY ALERT CARD or IMMUNOTHERAPY ALERT CARD at check-in to  the Emergency Department and triage nurse.  Should you have questions after your visit or need to cancel or reschedule your appointment, please contact [facility] [place] [facility]  [phone] and follow the prompts.  Office hours are 8:00 a.m. to 4:30 p.m. Monday - Friday. Please note that voicemails left after 4:00 p.m. may not be returned until the following business day.  We are closed weekends and major holidays. You have access to a nurse at all times for urgent questions. Please call the main number to the clinic [phone] and follow the prompts.  For any non-urgent questions, you may also contact your provider using MyChart. We now offer e-Visits for anyone 18 and older to request care online for non-urgent symptoms. For details visit [email or web address].   Also download the MyChart app! Go to the app store, search "MyChart", open the app, select [facility], and log in with your MyChart username and password.    

## 2023-05-03 LAB — TYPE AND SCREEN
ABO/RH(D): B POS
Antibody Screen: POSITIVE
Donor AG Type: NEGATIVE
Unit division: 0

## 2023-05-03 LAB — BPAM RBC
Blood Product Expiration Date: 202409092359
ISSUE DATE / TIME: 202408190911
Unit Type and Rh: 5100

## 2023-05-06 ENCOUNTER — Inpatient Hospital Stay: Payer: Medicare HMO

## 2023-05-06 ENCOUNTER — Encounter: Payer: Self-pay | Admitting: Oncology

## 2023-05-06 ENCOUNTER — Telehealth: Payer: Self-pay | Admitting: Oncology

## 2023-05-06 ENCOUNTER — Inpatient Hospital Stay: Payer: Medicare HMO | Admitting: Oncology

## 2023-05-06 ENCOUNTER — Other Ambulatory Visit: Payer: Self-pay | Admitting: Pharmacist

## 2023-05-06 VITALS — BP 135/85 | HR 83

## 2023-05-06 VITALS — BP 160/91 | HR 86 | Temp 96.0°F | Resp 18 | Wt 183.8 lb

## 2023-05-06 DIAGNOSIS — N1832 Chronic kidney disease, stage 3b: Secondary | ICD-10-CM

## 2023-05-06 DIAGNOSIS — Z5112 Encounter for antineoplastic immunotherapy: Secondary | ICD-10-CM | POA: Diagnosis not present

## 2023-05-06 DIAGNOSIS — K5900 Constipation, unspecified: Secondary | ICD-10-CM | POA: Insufficient documentation

## 2023-05-06 DIAGNOSIS — Z5111 Encounter for antineoplastic chemotherapy: Secondary | ICD-10-CM | POA: Diagnosis not present

## 2023-05-06 DIAGNOSIS — C9000 Multiple myeloma not having achieved remission: Secondary | ICD-10-CM

## 2023-05-06 DIAGNOSIS — D649 Anemia, unspecified: Secondary | ICD-10-CM

## 2023-05-06 DIAGNOSIS — E119 Type 2 diabetes mellitus without complications: Secondary | ICD-10-CM

## 2023-05-06 LAB — CBC WITH DIFFERENTIAL (CANCER CENTER ONLY)
Abs Immature Granulocytes: 0.01 10*3/uL (ref 0.00–0.07)
Basophils Absolute: 0 10*3/uL (ref 0.0–0.1)
Basophils Relative: 0 %
Eosinophils Absolute: 0.1 10*3/uL (ref 0.0–0.5)
Eosinophils Relative: 3 %
HCT: 28.1 % — ABNORMAL LOW (ref 39.0–52.0)
Hemoglobin: 9.1 g/dL — ABNORMAL LOW (ref 13.0–17.0)
Immature Granulocytes: 0 %
Lymphocytes Relative: 11 %
Lymphs Abs: 0.4 10*3/uL — ABNORMAL LOW (ref 0.7–4.0)
MCH: 32.6 pg (ref 26.0–34.0)
MCHC: 32.4 g/dL (ref 30.0–36.0)
MCV: 100.7 fL — ABNORMAL HIGH (ref 80.0–100.0)
Monocytes Absolute: 0.2 10*3/uL (ref 0.1–1.0)
Monocytes Relative: 5 %
Neutro Abs: 3.2 10*3/uL (ref 1.7–7.7)
Neutrophils Relative %: 81 %
Platelet Count: 85 10*3/uL — ABNORMAL LOW (ref 150–400)
RBC: 2.79 MIL/uL — ABNORMAL LOW (ref 4.22–5.81)
RDW: 17.8 % — ABNORMAL HIGH (ref 11.5–15.5)
WBC Count: 3.9 10*3/uL — ABNORMAL LOW (ref 4.0–10.5)
nRBC: 0 % (ref 0.0–0.2)

## 2023-05-06 LAB — CMP (CANCER CENTER ONLY)
ALT: 71 U/L — ABNORMAL HIGH (ref 0–44)
AST: 27 U/L (ref 15–41)
Albumin: 2.5 g/dL — ABNORMAL LOW (ref 3.5–5.0)
Alkaline Phosphatase: 120 U/L (ref 38–126)
Anion gap: 7 (ref 5–15)
BUN: 35 mg/dL — ABNORMAL HIGH (ref 8–23)
CO2: 21 mmol/L — ABNORMAL LOW (ref 22–32)
Calcium: 8.7 mg/dL — ABNORMAL LOW (ref 8.9–10.3)
Chloride: 107 mmol/L (ref 98–111)
Creatinine: 1.33 mg/dL — ABNORMAL HIGH (ref 0.61–1.24)
GFR, Estimated: 59 mL/min — ABNORMAL LOW (ref >60)
Glucose, Bld: 190 mg/dL — ABNORMAL HIGH (ref 70–99)
Potassium: 4.3 mmol/L (ref 3.5–5.1)
Sodium: 135 mmol/L (ref 135–145)
Total Bilirubin: 0.3 mg/dL (ref 0.3–1.2)
Total Protein: 8.9 g/dL — ABNORMAL HIGH (ref 6.5–8.1)

## 2023-05-06 MED ORDER — MONTELUKAST SODIUM 10 MG PO TABS
10.0000 mg | ORAL_TABLET | Freq: Once | ORAL | Status: AC
  Administered 2023-05-06: 10 mg via ORAL
  Filled 2023-05-06: qty 1

## 2023-05-06 MED ORDER — DIPHENHYDRAMINE HCL 25 MG PO CAPS
50.0000 mg | ORAL_CAPSULE | Freq: Once | ORAL | Status: AC
  Administered 2023-05-06: 50 mg via ORAL
  Filled 2023-05-06: qty 2

## 2023-05-06 MED ORDER — DARATUMUMAB-HYALURONIDASE-FIHJ 1800-30000 MG-UT/15ML ~~LOC~~ SOLN
1800.0000 mg | Freq: Once | SUBCUTANEOUS | Status: AC
  Administered 2023-05-06: 1800 mg via SUBCUTANEOUS
  Filled 2023-05-06: qty 15

## 2023-05-06 MED ORDER — DEXAMETHASONE 4 MG PO TABS
20.0000 mg | ORAL_TABLET | Freq: Once | ORAL | Status: AC
  Administered 2023-05-06: 20 mg via ORAL
  Filled 2023-05-06: qty 5

## 2023-05-06 MED ORDER — ACETAMINOPHEN 325 MG PO TABS
650.0000 mg | ORAL_TABLET | Freq: Once | ORAL | Status: AC
  Administered 2023-05-06: 650 mg via ORAL
  Filled 2023-05-06: qty 2

## 2023-05-06 MED ORDER — LENALIDOMIDE 10 MG PO CAPS: 10.0000 mg | ORAL_CAPSULE | Freq: Every day | ORAL | 0 refills | Status: DC

## 2023-05-06 NOTE — Assessment & Plan Note (Signed)
 Possibly due to myeloma.  Encourage oral hydration and avoid nephrotoxins.

## 2023-05-06 NOTE — Progress Notes (Signed)
Hematology/Oncology Progress note [phone] [phone]         Patient Care Team: Danella Penton, MD as PCP - General (Internal Medicine) [name], MD as Consulting Physician (Oncology)   REFERRING PROVIDER: [name], MD  CHIEF COMPLAINTS/REASON FOR VISIT:  Multiple myeloma   ASSESSMENT & PLAN:   Cancer Staging  Multiple myeloma [facility]) Staging form: Plasma Cell Myeloma and Plasma Cell Disorders, AJCC 8th Edition - Clinical stage from 04/14/2023: RISS Stage II (Beta-2-microglobulin (mg/L): 8.4, Albumin (g/dL): 2.6, ISS: Stage III, High-risk cytogenetics: Absent, LDH: Normal) - Signed by [name], MD on 04/22/2023   Multiple myeloma (HCC) Bone marrow biopsy was reviewed. 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 Proceed with C1 D15 Daratumumab  today. He gets Dexamethasone 20mg  weekly prior to Daratumumab treatments.  Continue Acyclovir, Aspirin 81mg    Type 2 diabetes mellitus (HCC) He is off metformin. Started on glipizide  Recommend diabetic diet.    CKD (chronic kidney disease) stage 3, GFR 30-59 ml/min (HCC) Possibly due to myeloma.  Encourage oral hydration and avoid nephrotoxins.    Encounter for antineoplastic chemotherapy Chemotherapy plan as listed above.   Discussed about antiemetics instruction.  Constipation Recommend senokot daily and Miralax daily PRN   Orders Placed This Encounter  Procedures   CBC with Differential (Cancer Center Only)    Standing Status:   Future    Standing Expiration Date:   05/12/2024   CMP (Cancer Center only)    Standing Status:   Future    Standing Expiration Date:   05/12/2024   CBC with Differential (Cancer Center Only)    Standing Status:   Future    Standing Expiration Date:   05/19/2024   CMP (Cancer Center only)    Standing Status:   Future    Standing Expiration Date:   05/19/2024   CMP  (Cancer Center only)    Standing Status:   Future    Standing Expiration Date:   05/26/2024   CBC with Differential (Cancer Center Only)    Standing Status:   Future    Standing Expiration Date:   05/26/2024   CBC with Differential (Cancer Center Only)    Standing Status:   Future    Standing Expiration Date:   06/02/2024   CMP (Cancer Center only)    Standing Status:   Future    Standing Expiration Date:   06/02/2024   CBC with Differential (Cancer Center Only)    Standing Status:   Future    Standing Expiration Date:   06/09/2024   CMP (Cancer Center only)    Standing Status:   Future    Standing Expiration Date:   06/09/2024   CMP (Cancer Center only)    Standing Status:   Future    Standing Expiration Date:   06/16/2024   CBC with Differential (Cancer Center Only)    Standing Status:   Future    Standing Expiration Date:   06/16/2024   Follow-up  1 weeks  We spent sufficient time to discuss many aspect of care, questions were answered to patient's satisfaction.    [name], MD, PhD [facility] Hematology Oncology 05/06/2023     HISTORY OF PRESENTING ILLNESS:  Victor Phillips is a  67 y.o.  male with PMH listed below who was referred to me for anemia  02/11/2023 - 02/14/2023 recent hospitalization due to pneumonia,  respiratory failure.  He was found to have a hemoglobin decreased at 6.8, status post PRBC transfusion during admission.  EGD showed gastritis.  Colonoscopy was not remarkable. 02/11/2023 TIBC 221 ferritin 107, iron saturation 16. Patient is currently taking fusion plus Vitamin B12 level in the 300s. His echo showed grade 2 diastolic CHF.  He denies recent chest pain on exertion, shortness of breath on minimal exertion, pre-syncopal episodes, or palpitations He had not noticed any recent bleeding such as epistaxis, hematuria or hematochezia.  He denies over the counter NSAID ingestion.  Oncology History  Multiple myeloma (HCC)  04/14/2023 Initial Diagnosis   Multiple  myeloma   03/13/2020 multiple myeloma panel showed M protein 4.3, IgA lambda Free lamda Level 142,-light chain ratio 0.07 04/05/2023 bone marrow biopsy showed hypercellular bone marrow with plasma cell neoplasm.The bone marrow is hypercellular for age with prominent increase in plasma cells representing 65% of all cells in the aspirate associated with interstitial infiltrates and numerous variably sized aggregates in  the clot and biopsy sections.  The plasma cells display lambda light chain restriction consistent with plasma cell neoplasm.  Normal cytogenetics.  FISH studies pending.    04/14/2023 Cancer Staging   Staging form: Plasma Cell Myeloma and Plasma Cell Disorders, AJCC 8th Edition - Clinical stage from 04/14/2023: RISS Stage II (Beta-2-microglobulin (mg/L): 8.4, Albumin (g/dL): 2.6, ISS: Stage III, High-risk cytogenetics: Absent, LDH: Normal) - Signed by [name], MD on 04/22/2023 Stage prefix: Initial diagnosis Beta 2 microglobulin range (mg/L): Greater than or equal to 5.5 Albumin range (g/dL): Less than 3.5 Cytogenetics: 1q addition, Other mutation   04/14/2023 Imaging   Skeletal survey 1. No lytic lesions or other intrinsic bony abnormality. 2. Borderline cardiomegaly with an interval decrease in size. 3. Diffuse atheromatous arterial calcifications including bilateral carotid artery calcifications, right greater than left   04/22/2023 -  Chemotherapy   Patient is on Treatment Plan : MYELOMA NEWLY DIAGNOSED TRANSPLANT CANDIDATE DaraVRd (Daratumumab SQ) q21d x 6 Cycles (Induction/Consolidation)      He has not picked up antiemetics, on  Aspirin, Acyclovir  He reports to be complaint with his medication. Glipizide for DM.  No new complaints. Gained weight.  + constipation.  No nausea vomiting diarrhea. He has gained the weight   MEDICAL HISTORY:  Past Medical History:  Diagnosis Date   Diabetes mellitus without complication (HCC)    Hypertension     SURGICAL HISTORY: Past  Surgical History:  Procedure Laterality Date   COLONOSCOPY N/A 02/13/2023   Procedure: COLONOSCOPY;  Surgeon: Toledo, Boykin Nearing, MD;  Location: [facility] ENDOSCOPY;  Service: Gastroenterology;  Laterality: N/A;   COLONOSCOPY N/A 02/14/2023   Procedure: COLONOSCOPY;  Surgeon: Toledo, Boykin Nearing, MD;  Location: [facility] ENDOSCOPY;  Service: Gastroenterology;  Laterality: N/A;   ESOPHAGOGASTRODUODENOSCOPY N/A 02/13/2023   Procedure: ESOPHAGOGASTRODUODENOSCOPY (EGD);  Surgeon: Toledo, Boykin Nearing, MD;  Location: [facility] ENDOSCOPY;  Service: Gastroenterology;  Laterality: N/A;    SOCIAL HISTORY: Social History   Socioeconomic History   Marital status: Married    Spouse name: Not on file   Number of children: Not on file   Years of education: Not on file   Highest education level: Not on file  Occupational History   Not on file  Tobacco Use   Smoking status: Never   Smokeless tobacco: Never  Vaping Use   Vaping status: Never Used  Substance and Sexual Activity   Alcohol use: Not Currently    Comment: quit approx 2001   Drug use: No  Sexual activity: Not on file  Other Topics Concern   Not on file  Social History Narrative   Not on file   Social Determinants of Health   Financial Resource Strain: Low Risk  (05/05/2023)   Received from [facility]   Overall Financial Resource Strain (CARDIA)    Difficulty of Paying Living Expenses: Not hard at all  Food Insecurity: No Food Insecurity (05/05/2023)   Received from [facility]   Hunger Vital Sign    Worried About Running Out of Food in the Last Year: Never true    Ran Out of Food in the Last Year: Never true  Transportation Needs: No Transportation Needs (05/05/2023)   Received from [facility] - Transportation    In the past 12 months, has lack of transportation kept you from medical appointments or from getting medications?: No    Lack of Transportation (Non-Medical): No  Physical  Activity: Sufficiently Active (09/24/2020)   Received from [facility], [facility]   Exercise Vital Sign    Days of Exercise per Week: 6 days    Minutes of Exercise per Session: 30 min  Stress: No Stress Concern Present (09/24/2020)   Received from [facility], [facility]   [employer] of Occupational Health - Occupational Stress Questionnaire    Feeling of Stress : Not at all  Social Connections: Socially Integrated (09/24/2020)   Received from [facility], [facility]   Social Connection and Isolation Panel [NHANES]    Frequency of Communication with Friends and Family: More than three times a week    Frequency of Social Gatherings with Friends and Family: Once a week    Attends Religious Services: More than 4 times per year    Active Member of Clubs or Organizations: Yes    Attends Engineer, structural: More than 4 times per year    Marital Status: Married  Catering manager Violence: Not At Risk (03/14/2023)   Humiliation, Afraid, Rape, and Kick questionnaire    Fear of Current or Ex-Partner: No    Emotionally Abused: No    Physically Abused: No    Sexually Abused: No    FAMILY HISTORY: Family History  Problem Relation Age of Onset   Diabetes Mother    Heart attack Father     ALLERGIES:  has No Known Allergies.  MEDICATIONS:  Current Outpatient Medications  Medication Sig Dispense Refill   acetaminophen (TYLENOL) 325 MG tablet Take 2 tablets (650 mg total) by mouth every 6 (six) hours as needed for mild pain (or Fever >/= 101). 90 tablet 1   acyclovir (ZOVIRAX) 400 MG tablet Take 1 tablet (400 mg total) by mouth 2 (two) times daily. 60 tablet 5   aspirin EC 81 MG tablet Take 1 tablet (81 mg total) by mouth daily. 100 tablet 3   atorvastatin (LIPITOR) 10 MG tablet Take 1 tablet by mouth daily.     glipiZIDE (GLUCOTROL XL) 2.5 MG 24 hr tablet Take by  mouth.     Multiple Vitamin (MULTIVITAMIN WITH MINERALS) TABS tablet Take 1 tablet by mouth daily. 90 tablet 1   olmesartan-hydrochlorothiazide (BENICAR HCT) 20-12.5 MG tablet Take 1 tablet by mouth daily.     ondansetron (ZOFRAN) 8 MG tablet Take 1 tablet (8 mg total) by mouth every 8 (eight) hours as needed for nausea or vomiting. 30 tablet 1   prochlorperazine (  COMPAZINE) 10 MG tablet Take 1 tablet (10 mg total) by mouth every 6 (six) hours as needed for nausea or vomiting. 30 tablet 1   senna (SENOKOT) 8.6 MG TABS tablet Take 2 tablets (17.2 mg total) by mouth daily. 120 tablet 0   temazepam (RESTORIL) 15 MG capsule Take by mouth.     [START ON 05/13/2023] lenalidomide (REVLIMID) 10 MG capsule Take 1 capsule (10 mg total) by mouth daily. Take for 14 days, then hold for 7 days off. Repeat every 21 days. 14 capsule 0   No current facility-administered medications for this visit.    Review of Systems  Constitutional:  Positive for fatigue. Negative for appetite change, chills, fever and unexpected weight change.  HENT:   Negative for hearing loss and voice change.   Eyes:  Negative for eye problems and icterus.  Respiratory:  Negative for chest tightness, cough and shortness of breath.   Cardiovascular:  Negative for chest pain and leg swelling.  Gastrointestinal:  Negative for abdominal distention and abdominal pain.  Endocrine: Negative for hot flashes.  Genitourinary:  Negative for difficulty urinating, dysuria and frequency.   Musculoskeletal:  Negative for arthralgias.  Skin:  Negative for itching and rash.  Neurological:  Negative for light-headedness and numbness.  Hematological:  Negative for adenopathy. Does not bruise/bleed easily.  Psychiatric/Behavioral:  Negative for confusion.     PHYSICAL EXAMINATION: Vitals:   05/06/23 0857  BP: (!) 160/91  Pulse: 86  Resp: 18  Temp: (!) 96 F (35.6 C)  SpO2: 100%   Filed Weights   05/06/23 0857  Weight: 183 lb 12.8 oz (83.4  kg)    Physical Exam Constitutional:      General: He is not in acute distress. HENT:     Head: Normocephalic and atraumatic.  Eyes:     General: No scleral icterus. Cardiovascular:     Rate and Rhythm: Normal rate and regular rhythm.     Heart sounds: Normal heart sounds.  Pulmonary:     Effort: Pulmonary effort is normal. No respiratory distress.     Breath sounds: No wheezing.  Abdominal:     General: Bowel sounds are normal. There is no distension.     Palpations: Abdomen is soft.  Musculoskeletal:        General: No deformity. Normal range of motion.     Cervical back: Normal range of motion and neck supple.  Skin:    General: Skin is warm and dry.     Findings: No erythema or rash.  Neurological:     Mental Status: He is alert and oriented to person, place, and time. Mental status is at baseline.     Cranial Nerves: No cranial nerve deficit.     Coordination: Coordination normal.  Psychiatric:        Mood and Affect: Mood normal.      LABORATORY DATA:  I have reviewed the data as listed    Latest Ref Rng & Units 05/06/2023    8:38 AM 04/29/2023    8:26 AM 04/22/2023    8:35 AM  CBC  WBC 4.0 - 10.5 K/uL 3.9  5.1  4.5   Hemoglobin 13.0 - 17.0 g/dL 9.1  8.3  7.8   Hematocrit 39.0 - 52.0 % 28.1  26.0  24.4   Platelets 150 - 400 K/uL 85  126  133       Latest Ref Rng & Units 05/06/2023    8:38 AM 04/29/2023    8:26  AM 04/22/2023    8:35 AM  CMP  Glucose 70 - 99 mg/dL 478  295  621   BUN 8 - 23 mg/dL 35  39  48   Creatinine 0.61 - 1.24 mg/dL 3.08  6.57  8.46   Sodium 135 - 145 mmol/L 135  134  133   Potassium 3.5 - 5.1 mmol/L 4.3  4.7  5.0   Chloride 98 - 111 mmol/L 107  107  106   CO2 22 - 32 mmol/L 21  20  20    Calcium 8.9 - 10.3 mg/dL 8.7  8.9  8.9   Total Protein 6.5 - 8.1 g/dL 8.9  96.2  95.2   Total Bilirubin 0.3 - 1.2 mg/dL 0.3  0.4  0.4   Alkaline Phos 38 - 126 U/L 120  98  91   AST 15 - 41 U/L 27  33  35   ALT 0 - 44 U/L 71  70  62    Lab Results   Component Value Date   IRON 100 03/14/2023   TIBC 272 03/14/2023   IRONPCTSAT 37 03/14/2023   FERRITIN 249 03/14/2023     RADIOGRAPHIC STUDIES: I have personally reviewed the radiological images as listed and agreed with the findings in the report. NM PET Image Initial (PI) Whole Body  Result Date: 04/29/2023 CLINICAL DATA:  Initial treatment strategy for multiple myeloma. EXAM: NUCLEAR MEDICINE PET WHOLE BODY TECHNIQUE: 9.8 mCi F-18 FDG was injected intravenously. Full-ring PET imaging was performed from the head to foot after the radiotracer. CT data was obtained and used for attenuation correction and anatomic localization. Fasting blood glucose: 101 mg/dl COMPARISON:  Bone survey 04/14/2023, CT chest abdomen pelvis 02/11/2023. FINDINGS: Mediastinal blood pool activity: SUV max 1.8 HEAD/NECK: No abnormal hypermetabolism. Incidental CT findings: None. CHEST: No abnormal hypermetabolism. Incidental CT findings: Atherosclerotic calcification of the aorta, aortic valve and coronary arteries. Heart is enlarged. No pericardial or pleural effusion. ABDOMEN/PELVIS: No abnormal hypermetabolism. Incidental CT findings: Liver, gallbladder, adrenal glands, kidneys, spleen, pancreas, stomach and bowel are grossly unremarkable. SKELETON: Tiny hypermetabolic lytic lesions in the posteromedial aspects of the left eighth and ninth ribs (4/86 and 92), SUV max 4.0. No additional abnormal hypermetabolism. Incidental CT findings: Degenerative changes in the spine. EXTREMITIES: No abnormal hypermetabolism. Incidental CT findings: None. IMPRESSION: 1. Two tiny hypermetabolic lucent lesions in the left posteromedial eighth and ninth ribs. No additional evidence of multiple myeloma. 2. Aortic atherosclerosis (ICD10-I70.0). Coronary artery calcification. Electronically Signed   By: Leanna Battles M.D.   On: 04/29/2023 12:05   DG Bone Survey Met  Result Date: 04/21/2023 CLINICAL DATA:  Macrocytic anemia. EXAM: METASTATIC  BONE SURVEY COMPARISON:  Chest radiograph dated 02/13/2023. Chest CTA and abdomen and pelvis CT dated 02/11/2023. FINDINGS: Borderline enlarged cardiac silhouette with an interval decrease in size. Cervical, thoracic and lumbar spine degenerative changes. Right greater than left neck calcifications. Old, healed left clavicle fracture. Moderate right and mild left acromioclavicular joint degenerative changes. Diffuse atheromatous arterial calcifications. No lytic lesions or other intrinsic bony abnormality. IMPRESSION: 1. No lytic lesions or other intrinsic bony abnormality. 2. Borderline cardiomegaly with an interval decrease in size. 3. Diffuse atheromatous arterial calcifications including bilateral carotid artery calcifications, right greater than left. Electronically Signed   By: Beckie Salts M.D.   On: 04/21/2023 11:49

## 2023-05-06 NOTE — Assessment & Plan Note (Signed)
Recommend senokot daily and Miralax daily PRN

## 2023-05-06 NOTE — Assessment & Plan Note (Signed)
 Chemotherapy plan as listed above.   Discussed about antiemetics instruction.

## 2023-05-06 NOTE — Assessment & Plan Note (Signed)
 He is off metformin. Started on glipizide  Recommend diabetic diet.

## 2023-05-06 NOTE — Assessment & Plan Note (Addendum)
Bone marrow biopsy was reviewed. 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 Proceed with C1 D15 Daratumumab  today. He gets Dexamethasone 20mg  weekly prior to Daratumumab treatments.  Continue Acyclovir, Aspirin 81mg 

## 2023-05-06 NOTE — Patient Instructions (Signed)
[facility]  Discharge Instructions: Thank you for choosing [facility] to provide your oncology and hematology care.  If you have a lab appointment with the Cancer Center, please go directly to the Cancer Center and check in at the registration area.  Wear comfortable clothing and clothing appropriate for easy access to any Portacath or PICC line.   We strive to give you quality time with your provider. You may need to reschedule your appointment if you arrive late (15 or more minutes).  Arriving late affects you and other patients whose appointments are after yours.  Also, if you miss three or more appointments without notifying the office, you may be dismissed from the clinic at the provider's discretion.      For prescription refill requests, have your pharmacy contact our office and allow 72 hours for refills to be completed.    Today you received the following chemotherapy and/or immunotherapy agents- daratumumab      To help prevent nausea and vomiting after your treatment, we encourage you to take your nausea medication as directed.  BELOW ARE SYMPTOMS THAT SHOULD BE REPORTED IMMEDIATELY: *FEVER GREATER THAN 100.4 F (38 C) OR HIGHER *CHILLS OR SWEATING *NAUSEA AND VOMITING THAT IS NOT CONTROLLED WITH YOUR NAUSEA MEDICATION *UNUSUAL SHORTNESS OF BREATH *UNUSUAL BRUISING OR BLEEDING *URINARY PROBLEMS (pain or burning when urinating, or frequent urination) *BOWEL PROBLEMS (unusual diarrhea, constipation, pain near the anus) TENDERNESS IN MOUTH AND THROAT WITH OR WITHOUT PRESENCE OF ULCERS (sore throat, sores in mouth, or a toothache) UNUSUAL RASH, SWELLING OR PAIN  UNUSUAL VAGINAL DISCHARGE OR ITCHING   Items with * indicate a potential emergency and should be followed up as soon as possible or go to the Emergency Department if any problems should occur.  Please show the CHEMOTHERAPY ALERT CARD or IMMUNOTHERAPY ALERT CARD at check-in  to the Emergency Department and triage nurse.  Should you have questions after your visit or need to cancel or reschedule your appointment, please contact [facility] [place] [facility]  [phone] and follow the prompts.  Office hours are 8:00 a.m. to 4:30 p.m. Monday - Friday. Please note that voicemails left after 4:00 p.m. may not be returned until the following business day.  We are closed weekends and major holidays. You have access to a nurse at all times for urgent questions. Please call the main number to the clinic [phone] and follow the prompts.  For any non-urgent questions, you may also contact your provider using MyChart. We now offer e-Visits for anyone 18 and older to request care online for non-urgent symptoms. For details visit [email or web address].   Also download the MyChart app! Go to the app store, search "MyChart", open the app, select [facility], and log in with your MyChart username and password.    

## 2023-05-06 NOTE — Telephone Encounter (Signed)
Victor Phillips-Patient scheduled for possible blood on Monday, 8/26. Hgb: 9.1 today. Please advise.   [name], MD- no blood thanks   Per secure chat.  Monday appt canceled and I spoke with pt to let him know.

## 2023-05-07 LAB — SAMPLE TO BLOOD BANK

## 2023-05-08 ENCOUNTER — Other Ambulatory Visit: Payer: Self-pay

## 2023-05-09 ENCOUNTER — Inpatient Hospital Stay: Payer: Medicare HMO

## 2023-05-10 ENCOUNTER — Inpatient Hospital Stay: Payer: Medicare HMO

## 2023-05-10 ENCOUNTER — Encounter: Payer: Self-pay | Admitting: Oncology

## 2023-05-10 NOTE — Progress Notes (Signed)
Nutrition Assessment:  Referral for low potassium diet instruction  67 year old male with multiple myeloma.  Past medical history of DM, CKD.  Patient receiving dartumumab, bortezomib, dexamethasone, lenalidomide.    Met with patient and wife.  Reports that appetite is good. Breakfast is usually bacon, egg, sandwich or eggs, grits and breakfast meat.  Lunch is cheese nabs and dinner is sandwich.  Reports constipation relieved with enema yesterday.  Taking stool softner.      Medications: compazine, zofran, glipzide (restarted), MVI, senna  Labs: K WNL (5.2 on 7/31), glucose 190, BUN 35, creatinine 1.33, calcium 8.7  Anthropometrics:   Height: 67 inches Weight: 184 lb today Weight has been increasing recently BMI: 28   NUTRITION DIAGNOSIS: Food and nutrition related knowledge deficit related to elevated potassium concerns as evidenced by MD referral   INTERVENTION:  Discussed foods high and low in potassium.  Handout provided.  Current K level is normal. Encouraged well balanced diet including protein rich foods.  Contact information provided    MONITORING, EVALUATION, GOAL: weight trends, intake   NEXT VISIT: to be determined with treatment  Kaveri Perras B. Freida Busman, RD, LDN Registered Dietitian [phone]

## 2023-05-12 ENCOUNTER — Other Ambulatory Visit: Payer: Self-pay

## 2023-05-12 DIAGNOSIS — C9000 Multiple myeloma not having achieved remission: Secondary | ICD-10-CM

## 2023-05-13 ENCOUNTER — Inpatient Hospital Stay (HOSPITAL_BASED_OUTPATIENT_CLINIC_OR_DEPARTMENT_OTHER): Payer: Medicare HMO | Admitting: Oncology

## 2023-05-13 ENCOUNTER — Inpatient Hospital Stay: Payer: Medicare HMO

## 2023-05-13 ENCOUNTER — Encounter: Payer: Self-pay | Admitting: Oncology

## 2023-05-13 VITALS — BP 150/90 | HR 69 | Resp 18

## 2023-05-13 VITALS — BP 166/96 | HR 100 | Temp 98.6°F | Resp 19 | Wt 182.6 lb

## 2023-05-13 DIAGNOSIS — Z5111 Encounter for antineoplastic chemotherapy: Secondary | ICD-10-CM

## 2023-05-13 DIAGNOSIS — C9000 Multiple myeloma not having achieved remission: Secondary | ICD-10-CM | POA: Diagnosis not present

## 2023-05-13 DIAGNOSIS — N1832 Chronic kidney disease, stage 3b: Secondary | ICD-10-CM

## 2023-05-13 DIAGNOSIS — E119 Type 2 diabetes mellitus without complications: Secondary | ICD-10-CM | POA: Diagnosis not present

## 2023-05-13 DIAGNOSIS — Z5112 Encounter for antineoplastic immunotherapy: Secondary | ICD-10-CM | POA: Diagnosis not present

## 2023-05-13 LAB — CMP (CANCER CENTER ONLY)
ALT: 56 U/L — ABNORMAL HIGH (ref 0–44)
AST: 21 U/L (ref 15–41)
Albumin: 2.8 g/dL — ABNORMAL LOW (ref 3.5–5.0)
Alkaline Phosphatase: 113 U/L (ref 38–126)
Anion gap: 6 (ref 5–15)
BUN: 29 mg/dL — ABNORMAL HIGH (ref 8–23)
CO2: 22 mmol/L (ref 22–32)
Calcium: 8.5 mg/dL — ABNORMAL LOW (ref 8.9–10.3)
Chloride: 104 mmol/L (ref 98–111)
Creatinine: 1.2 mg/dL (ref 0.61–1.24)
GFR, Estimated: >60 mL/min (ref >60)
Glucose, Bld: 212 mg/dL — ABNORMAL HIGH (ref 70–99)
Potassium: 4.7 mmol/L (ref 3.5–5.1)
Sodium: 132 mmol/L — ABNORMAL LOW (ref 135–145)
Total Bilirubin: 0.4 mg/dL (ref 0.3–1.2)
Total Protein: 8.8 g/dL — ABNORMAL HIGH (ref 6.5–8.1)

## 2023-05-13 LAB — CBC WITH DIFFERENTIAL (CANCER CENTER ONLY)
Abs Immature Granulocytes: 0.01 10*3/uL (ref 0.00–0.07)
Basophils Absolute: 0 10*3/uL (ref 0.0–0.1)
Basophils Relative: 0 %
Eosinophils Absolute: 0.1 10*3/uL (ref 0.0–0.5)
Eosinophils Relative: 1 %
HCT: 28.4 % — ABNORMAL LOW (ref 39.0–52.0)
Hemoglobin: 9.3 g/dL — ABNORMAL LOW (ref 13.0–17.0)
Immature Granulocytes: 0 %
Lymphocytes Relative: 10 %
Lymphs Abs: 0.5 10*3/uL — ABNORMAL LOW (ref 0.7–4.0)
MCH: 32.9 pg (ref 26.0–34.0)
MCHC: 32.7 g/dL (ref 30.0–36.0)
MCV: 100.4 fL — ABNORMAL HIGH (ref 80.0–100.0)
Monocytes Absolute: 0.6 10*3/uL (ref 0.1–1.0)
Monocytes Relative: 12 %
Neutro Abs: 3.9 10*3/uL (ref 1.7–7.7)
Neutrophils Relative %: 77 %
Platelet Count: 116 10*3/uL — ABNORMAL LOW (ref 150–400)
RBC: 2.83 MIL/uL — ABNORMAL LOW (ref 4.22–5.81)
RDW: 17.3 % — ABNORMAL HIGH (ref 11.5–15.5)
WBC Count: 5.1 10*3/uL (ref 4.0–10.5)
nRBC: 0 % (ref 0.0–0.2)

## 2023-05-13 LAB — SAMPLE TO BLOOD BANK

## 2023-05-13 MED ORDER — DARATUMUMAB-HYALURONIDASE-FIHJ 1800-30000 MG-UT/15ML ~~LOC~~ SOLN
1800.0000 mg | Freq: Once | SUBCUTANEOUS | Status: AC
  Administered 2023-05-13: 1800 mg via SUBCUTANEOUS
  Filled 2023-05-13: qty 15

## 2023-05-13 MED ORDER — ACETAMINOPHEN 325 MG PO TABS
650.0000 mg | ORAL_TABLET | Freq: Once | ORAL | Status: AC
  Administered 2023-05-13: 650 mg via ORAL
  Filled 2023-05-13: qty 2

## 2023-05-13 MED ORDER — BORTEZOMIB CHEMO SQ INJECTION 3.5 MG (2.5MG/ML)
1.3000 mg/m2 | Freq: Once | INTRAMUSCULAR | Status: AC
  Administered 2023-05-13: 2.5 mg via SUBCUTANEOUS
  Filled 2023-05-13: qty 1

## 2023-05-13 MED ORDER — DIPHENHYDRAMINE HCL 25 MG PO CAPS
50.0000 mg | ORAL_CAPSULE | Freq: Once | ORAL | Status: AC
  Administered 2023-05-13: 50 mg via ORAL
  Filled 2023-05-13: qty 2

## 2023-05-13 MED ORDER — DEXAMETHASONE 4 MG PO TABS
20.0000 mg | ORAL_TABLET | Freq: Once | ORAL | Status: AC
  Administered 2023-05-13: 20 mg via ORAL
  Filled 2023-05-13: qty 5

## 2023-05-13 NOTE — Progress Notes (Signed)
Hematology/Oncology Progress note [phone] [phone]         Patient Care Team: Danella Penton, MD as PCP - General (Internal Medicine) [name], MD as Consulting Physician (Oncology)   REFERRING PROVIDER: [name], MD  CHIEF COMPLAINTS/REASON FOR VISIT:  Multiple myeloma   ASSESSMENT & PLAN:   Cancer Staging  Multiple myeloma [facility]) Staging form: Plasma Cell Myeloma and Plasma Cell Disorders, AJCC 8th Edition - Clinical stage from 04/14/2023: RISS Stage II (Beta-2-microglobulin (mg/L): 8.4, Albumin (g/dL): 2.6, ISS: Stage III, High-risk cytogenetics: Absent, LDH: Normal) - Signed by [name], MD on 04/22/2023   Multiple myeloma (HCC) Bone marrow biopsy was reviewed. 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 Proceed with Daratumumab and Velcade  today. He gets Dexamethasone 20mg  weekly prior to Daratumumab treatments.  Continue Acyclovir, Aspirin 81mg    Type 2 diabetes mellitus (HCC) He is off metformin. Started on glipizide  Recommend diabetic diet.    CKD (chronic kidney disease) stage 3, GFR 30-59 ml/min (HCC) Cr is improving  Encourage oral hydration and avoid nephrotoxins.    Encounter for antineoplastic chemotherapy Chemotherapy plan as listed above.  Discussed about antiemetics instruction.   Orders Placed This Encounter  Procedures   Multiple Myeloma Panel (SPEP&IFE w/QIG)    Standing Status:   Future    Standing Expiration Date:   05/19/2024   Kappa/lambda light chains    Standing Status:   Future    Standing Expiration Date:   05/19/2024   Follow-up  1 weeks  We spent sufficient time to discuss many aspect of care, questions were answered to patient's satisfaction.    [name], MD, PhD [facility] Hematology Oncology 05/13/2023     HISTORY OF PRESENTING ILLNESS:  Victor Phillips  is a  67 y.o.  male with PMH listed below who was referred to me for anemia  02/11/2023 - 02/14/2023 recent hospitalization due to pneumonia, respiratory failure.  He was found to have a hemoglobin decreased at 6.8, status post PRBC transfusion during admission.  EGD showed gastritis.  Colonoscopy was not remarkable. 02/11/2023 TIBC 221 ferritin 107, iron saturation 16. Patient is currently taking fusion plus Vitamin B12 level in the 300s. His echo showed grade 2 diastolic CHF.  He denies recent chest pain on exertion, shortness of breath on minimal exertion, pre-syncopal episodes, or palpitations He had not noticed any recent bleeding such as epistaxis, hematuria or hematochezia.  He denies over the counter NSAID ingestion.  Oncology History  Multiple myeloma (HCC)  04/14/2023 Initial Diagnosis   Multiple myeloma   03/13/2020 multiple myeloma panel showed M protein 4.3, IgA lambda Free lamda Level 142,-light chain ratio 0.07 04/05/2023 bone marrow biopsy showed hypercellular bone marrow with plasma cell neoplasm.The bone marrow is hypercellular for age with prominent increase in plasma cells representing 65% of all cells in the aspirate associated with interstitial infiltrates and numerous variably sized aggregates in  the clot and biopsy sections.  The plasma cells display lambda light chain restriction consistent with plasma cell neoplasm.  Normal cytogenetics.  FISH studies pending.    04/14/2023 Cancer Staging   Staging form: Plasma Cell Myeloma and Plasma Cell Disorders, AJCC 8th Edition - Clinical stage from 04/14/2023: RISS Stage II (Beta-2-microglobulin (mg/L): 8.4, Albumin (g/dL): 2.6, ISS: Stage III, High-risk cytogenetics: Absent, LDH: Normal) - Signed by [name],  MD on 04/22/2023 Stage prefix: Initial diagnosis Beta 2 microglobulin range (mg/L): Greater than or equal to 5.5 Albumin range (g/dL): Less than 3.5 Cytogenetics: 1q addition, Other mutation   04/14/2023 Imaging   Skeletal  survey 1. No lytic lesions or other intrinsic bony abnormality. 2. Borderline cardiomegaly with an interval decrease in size. 3. Diffuse atheromatous arterial calcifications including bilateral carotid artery calcifications, right greater than left   04/22/2023 -  Chemotherapy   Patient is on Treatment Plan : MYELOMA NEWLY DIAGNOSED TRANSPLANT CANDIDATE DaraVRd (Daratumumab SQ) q21d x 6 Cycles (Induction/Consolidation)      He has not picked up antiemetics, on  Aspirin, Acyclovir  He reports to be complaint with his medication. Glipizide for DM.  No new complaints. Gained weight.  + constipation.  No nausea vomiting diarrhea. He has gained the weight   MEDICAL HISTORY:  Past Medical History:  Diagnosis Date   Diabetes mellitus without complication (HCC)    Hypertension     SURGICAL HISTORY: Past Surgical History:  Procedure Laterality Date   COLONOSCOPY N/A 02/13/2023   Procedure: COLONOSCOPY;  Surgeon: Toledo, Boykin Nearing, MD;  Location: [facility] ENDOSCOPY;  Service: Gastroenterology;  Laterality: N/A;   COLONOSCOPY N/A 02/14/2023   Procedure: COLONOSCOPY;  Surgeon: Toledo, Boykin Nearing, MD;  Location: [facility] ENDOSCOPY;  Service: Gastroenterology;  Laterality: N/A;   ESOPHAGOGASTRODUODENOSCOPY N/A 02/13/2023   Procedure: ESOPHAGOGASTRODUODENOSCOPY (EGD);  Surgeon: Toledo, Boykin Nearing, MD;  Location: [facility] ENDOSCOPY;  Service: Gastroenterology;  Laterality: N/A;    SOCIAL HISTORY: Social History   Socioeconomic History   Marital status: Married    Spouse name: Not on file   Number of children: Not on file   Years of education: Not on file   Highest education level: Not on file  Occupational History   Not on file  Tobacco Use   Smoking status: Never   Smokeless tobacco: Never  Vaping Use   Vaping status: Never Used  Substance and Sexual Activity   Alcohol use: Not Currently    Comment: quit approx 2001   Drug use: No   Sexual activity: Not on file  Other Topics Concern   Not on file   Social History Narrative   Not on file   Social Determinants of Health   Financial Resource Strain: Low Risk  (05/05/2023)   Received from [facility]   Overall Financial Resource Strain (CARDIA)    Difficulty of Paying Living Expenses: Not hard at all  Food Insecurity: No Food Insecurity (05/05/2023)   Received from [facility]   Hunger Vital Sign    Worried About Running Out of Food in the Last Year: Never true    Ran Out of Food in the Last Year: Never true  Transportation Needs: No Transportation Needs (05/05/2023)   Received from [facility] - Transportation    In the past 12 months, has lack of transportation kept you from medical appointments or from getting medications?: No    Lack of Transportation (Non-Medical): No  Physical Activity: Sufficiently Active (09/24/2020)   Received from [facility], [facility]   Exercise Vital Sign    Days of Exercise per Week: 6 days    Minutes of Exercise per Session: 30 min  Stress: No Stress Concern Present (09/24/2020)   Received from [facility], [facility]   [employer] of Occupational Health - Occupational Stress Questionnaire    Feeling of Stress :  Not at all  Social Connections: Socially Integrated (09/24/2020)   Received from [facility], [facility]   Social Connection and Isolation Panel [NHANES]    Frequency of Communication with Friends and Family: More than three times a week    Frequency of Social Gatherings with Friends and Family: Once a week    Attends Religious Services: More than 4 times per year    Active Member of [employer] or Organizations: Yes    Attends Engineer, structural: More than 4 times per year    Marital Status: Married  Catering manager Violence: Not At Risk (03/14/2023)   Humiliation, Afraid, Rape, and Kick questionnaire     Fear of Current or Ex-Partner: No    Emotionally Abused: No    Physically Abused: No    Sexually Abused: No    FAMILY HISTORY: Family History  Problem Relation Age of Onset   Diabetes Mother    Heart attack Father     ALLERGIES:  has No Known Allergies.  MEDICATIONS:  Current Outpatient Medications  Medication Sig Dispense Refill   acetaminophen (TYLENOL) 325 MG tablet Take 2 tablets (650 mg total) by mouth every 6 (six) hours as needed for mild pain (or Fever >/= 101). 90 tablet 1   acyclovir (ZOVIRAX) 400 MG tablet Take 1 tablet (400 mg total) by mouth 2 (two) times daily. 60 tablet 5   aspirin EC 81 MG tablet Take 1 tablet (81 mg total) by mouth daily. 100 tablet 3   atorvastatin (LIPITOR) 10 MG tablet Take 1 tablet by mouth daily.     glipiZIDE (GLUCOTROL XL) 2.5 MG 24 hr tablet Take by mouth.     lenalidomide (REVLIMID) 10 MG capsule Take 1 capsule (10 mg total) by mouth daily. Take for 14 days, then hold for 7 days off. Repeat every 21 days. 14 capsule 0   Multiple Vitamin (MULTIVITAMIN WITH MINERALS) TABS tablet Take 1 tablet by mouth daily. 90 tablet 1   olmesartan-hydrochlorothiazide (BENICAR HCT) 20-12.5 MG tablet Take 1 tablet by mouth daily.     ondansetron (ZOFRAN) 8 MG tablet Take 1 tablet (8 mg total) by mouth every 8 (eight) hours as needed for nausea or vomiting. 30 tablet 1   prochlorperazine (COMPAZINE) 10 MG tablet Take 1 tablet (10 mg total) by mouth every 6 (six) hours as needed for nausea or vomiting. 30 tablet 1   senna (SENOKOT) 8.6 MG TABS tablet Take 2 tablets (17.2 mg total) by mouth daily. 120 tablet 0   temazepam (RESTORIL) 15 MG capsule Take by mouth.     No current facility-administered medications for this visit.   Facility-Administered Medications Ordered in Other Visits  Medication Dose Route Frequency Provider Last Rate Last Admin   bortezomib SQ (VELCADE) chemo injection (2.5mg /mL concentration) 2.5 mg  1.3 mg/m2 (Treatment Plan Recorded)  Subcutaneous Once [name], MD       daratumumab-hyaluronidase-fihj [facility] FASPRO) 1800-30000 MG-UT/15ML chemo SQ injection 1,800 mg  1,800 mg Subcutaneous Once [name], MD        Review of Systems  Constitutional:  Positive for fatigue. Negative for appetite change, chills, fever and unexpected weight change.  HENT:   Negative for hearing loss and voice change.   Eyes:  Negative for eye problems and icterus.  Respiratory:  Negative for chest tightness, cough and shortness of breath.   Cardiovascular:  Negative for chest pain and leg swelling.  Gastrointestinal:  Negative for abdominal distention and abdominal pain.  Endocrine: Negative for hot flashes.  Genitourinary:  Negative for difficulty urinating, dysuria and frequency.   Musculoskeletal:  Negative for arthralgias.  Skin:  Negative for itching and rash.  Neurological:  Negative for light-headedness and numbness.  Hematological:  Negative for adenopathy. Does not bruise/bleed easily.  Psychiatric/Behavioral:  Negative for confusion.     PHYSICAL EXAMINATION: Vitals:   05/13/23 0834  BP: (!) 166/96  Pulse: 100  Resp: 19  Temp: 98.6 F (37 C)  SpO2: 99%   Filed Weights   05/13/23 0834  Weight: 182 lb 9.6 oz (82.8 kg)    Physical Exam Constitutional:      General: He is not in acute distress. HENT:     Head: Normocephalic and atraumatic.  Eyes:     General: No scleral icterus. Cardiovascular:     Rate and Rhythm: Normal rate and regular rhythm.     Heart sounds: Normal heart sounds.  Pulmonary:     Effort: Pulmonary effort is normal. No respiratory distress.     Breath sounds: No wheezing.  Abdominal:     General: Bowel sounds are normal. There is no distension.     Palpations: Abdomen is soft.  Musculoskeletal:        General: No deformity. Normal range of motion.     Cervical back: Normal range of motion and neck supple.  Skin:    General: Skin is warm and dry.     Findings: No erythema or rash.   Neurological:     Mental Status: He is alert and oriented to person, place, and time. Mental status is at baseline.     Cranial Nerves: No cranial nerve deficit.     Coordination: Coordination normal.  Psychiatric:        Mood and Affect: Mood normal.      LABORATORY DATA:  I have reviewed the data as listed    Latest Ref Rng & Units 05/13/2023    8:01 AM 05/06/2023    8:38 AM 04/29/2023    8:26 AM  CBC  WBC 4.0 - 10.5 K/uL 5.1  3.9  5.1   Hemoglobin 13.0 - 17.0 g/dL 9.3  9.1  8.3   Hematocrit 39.0 - 52.0 % 28.4  28.1  26.0   Platelets 150 - 400 K/uL 116  85  126       Latest Ref Rng & Units 05/13/2023    8:01 AM 05/06/2023    8:38 AM 04/29/2023    8:26 AM  CMP  Glucose 70 - 99 mg/dL 098  119  147   BUN 8 - 23 mg/dL 29  35  39   Creatinine 0.61 - 1.24 mg/dL 8.29  5.62  1.30   Sodium 135 - 145 mmol/L 132  135  134   Potassium 3.5 - 5.1 mmol/L 4.7  4.3  4.7   Chloride 98 - 111 mmol/L 104  107  107   CO2 22 - 32 mmol/L 22  21  20    Calcium 8.9 - 10.3 mg/dL 8.5  8.7  8.9   Total Protein 6.5 - 8.1 g/dL 8.8  8.9  86.5   Total Bilirubin 0.3 - 1.2 mg/dL 0.4  0.3  0.4   Alkaline Phos 38 - 126 U/L 113  120  98   AST 15 - 41 U/L 21  27  33   ALT 0 - 44 U/L 56  71  70    Lab Results  Component Value Date   IRON 100  03/14/2023   TIBC 272 03/14/2023   IRONPCTSAT 37 03/14/2023   FERRITIN 249 03/14/2023     RADIOGRAPHIC STUDIES: I have personally reviewed the radiological images as listed and agreed with the findings in the report. NM PET Image Initial (PI) Whole Body  Result Date: 04/29/2023 CLINICAL DATA:  Initial treatment strategy for multiple myeloma. EXAM: NUCLEAR MEDICINE PET WHOLE BODY TECHNIQUE: 9.8 mCi F-18 FDG was injected intravenously. Full-ring PET imaging was performed from the head to foot after the radiotracer. CT data was obtained and used for attenuation correction and anatomic localization. Fasting blood glucose: 101 mg/dl COMPARISON:  Bone survey 04/14/2023, CT  chest abdomen pelvis 02/11/2023. FINDINGS: Mediastinal blood pool activity: SUV max 1.8 HEAD/NECK: No abnormal hypermetabolism. Incidental CT findings: None. CHEST: No abnormal hypermetabolism. Incidental CT findings: Atherosclerotic calcification of the aorta, aortic valve and coronary arteries. Heart is enlarged. No pericardial or pleural effusion. ABDOMEN/PELVIS: No abnormal hypermetabolism. Incidental CT findings: Liver, gallbladder, adrenal glands, kidneys, spleen, pancreas, stomach and bowel are grossly unremarkable. SKELETON: Tiny hypermetabolic lytic lesions in the posteromedial aspects of the left eighth and ninth ribs (4/86 and 92), SUV max 4.0. No additional abnormal hypermetabolism. Incidental CT findings: Degenerative changes in the spine. EXTREMITIES: No abnormal hypermetabolism. Incidental CT findings: None. IMPRESSION: 1. Two tiny hypermetabolic lucent lesions in the left posteromedial eighth and ninth ribs. No additional evidence of multiple myeloma. 2. Aortic atherosclerosis (ICD10-I70.0). Coronary artery calcification. Electronically Signed   By: Leanna Battles M.D.   On: 04/29/2023 12:05   DG Bone Survey Met  Result Date: 04/21/2023 CLINICAL DATA:  Macrocytic anemia. EXAM: METASTATIC BONE SURVEY COMPARISON:  Chest radiograph dated 02/13/2023. Chest CTA and abdomen and pelvis CT dated 02/11/2023. FINDINGS: Borderline enlarged cardiac silhouette with an interval decrease in size. Cervical, thoracic and lumbar spine degenerative changes. Right greater than left neck calcifications. Old, healed left clavicle fracture. Moderate right and mild left acromioclavicular joint degenerative changes. Diffuse atheromatous arterial calcifications. No lytic lesions or other intrinsic bony abnormality. IMPRESSION: 1. No lytic lesions or other intrinsic bony abnormality. 2. Borderline cardiomegaly with an interval decrease in size. 3. Diffuse atheromatous arterial calcifications including bilateral carotid  artery calcifications, right greater than left. Electronically Signed   By: Beckie Salts M.D.   On: 04/21/2023 11:49

## 2023-05-13 NOTE — Patient Instructions (Signed)
[place] [facility]  Discharge Instructions: Thank you for choosing [facility] to provide your oncology and hematology care.  If you have a lab appointment with the Cancer Center, please go directly to the Cancer Center and check in at the registration area.  Wear comfortable clothing and clothing appropriate for easy access to any Portacath or PICC line.   We strive to give you quality time with your provider. You may need to reschedule your appointment if you arrive late (15 or more minutes).  Arriving late affects you and other patients whose appointments are after yours.  Also, if you miss three or more appointments without notifying the office, you may be dismissed from the clinic at the provider's discretion.      For prescription refill requests, have your pharmacy contact our office and allow 72 hours for refills to be completed.    Today you received the following chemotherapy and/or immunotherapy agents Velcade and Darzalex injection.      To help prevent nausea and vomiting after your treatment, we encourage you to take your nausea medication as directed.  BELOW ARE SYMPTOMS THAT SHOULD BE REPORTED IMMEDIATELY: *FEVER GREATER THAN 100.4 F (38 C) OR HIGHER *CHILLS OR SWEATING *NAUSEA AND VOMITING THAT IS NOT CONTROLLED WITH YOUR NAUSEA MEDICATION *UNUSUAL SHORTNESS OF BREATH *UNUSUAL BRUISING OR BLEEDING *URINARY PROBLEMS (pain or burning when urinating, or frequent urination) *BOWEL PROBLEMS (unusual diarrhea, constipation, pain near the anus) TENDERNESS IN MOUTH AND THROAT WITH OR WITHOUT PRESENCE OF ULCERS (sore throat, sores in mouth, or a toothache) UNUSUAL RASH, SWELLING OR PAIN  UNUSUAL VAGINAL DISCHARGE OR ITCHING   Items with * indicate a potential emergency and should be followed up as soon as possible or go to the Emergency Department if any problems should occur.  Please show the CHEMOTHERAPY ALERT CARD or IMMUNOTHERAPY  ALERT CARD at check-in to the Emergency Department and triage nurse.  Should you have questions after your visit or need to cancel or reschedule your appointment, please contact [place] [facility]  [phone] and follow the prompts.  Office hours are 8:00 a.m. to 4:30 p.m. Monday - Friday. Please note that voicemails left after 4:00 p.m. may not be returned until the following business day.  We are closed weekends and major holidays. You have access to a nurse at all times for urgent questions. Please call the main number to the clinic [phone] and follow the prompts.  For any non-urgent questions, you may also contact your provider using MyChart. We now offer e-Visits for anyone 35 and older to request care online for non-urgent symptoms. For details visit [email or web address].   Also download the MyChart app! Go to the app store, search "MyChart", open the app, select [place], and log in with your MyChart username and password.

## 2023-05-13 NOTE — Assessment & Plan Note (Signed)
Cr is improving  Encourage oral hydration and avoid nephrotoxins.

## 2023-05-13 NOTE — Assessment & Plan Note (Signed)
 He is off metformin. Started on glipizide  Recommend diabetic diet.

## 2023-05-13 NOTE — Assessment & Plan Note (Addendum)
Bone marrow biopsy was reviewed. 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 Proceed with Daratumumab and Velcade  today. He gets Dexamethasone 20mg  weekly prior to Daratumumab treatments.  Continue Acyclovir, Aspirin 81mg 

## 2023-05-13 NOTE — Assessment & Plan Note (Signed)
 Chemotherapy plan as listed above.   Discussed about antiemetics instruction.

## 2023-05-14 ENCOUNTER — Other Ambulatory Visit: Payer: Self-pay

## 2023-05-17 ENCOUNTER — Inpatient Hospital Stay: Payer: Medicare HMO | Attending: Oncology

## 2023-05-17 VITALS — BP 123/87 | HR 98 | Temp 98.1°F | Resp 18 | Wt 176.5 lb

## 2023-05-17 DIAGNOSIS — Z79899 Other long term (current) drug therapy: Secondary | ICD-10-CM | POA: Diagnosis not present

## 2023-05-17 DIAGNOSIS — Z23 Encounter for immunization: Secondary | ICD-10-CM | POA: Insufficient documentation

## 2023-05-17 DIAGNOSIS — C9000 Multiple myeloma not having achieved remission: Secondary | ICD-10-CM | POA: Diagnosis present

## 2023-05-17 DIAGNOSIS — Z5112 Encounter for antineoplastic immunotherapy: Secondary | ICD-10-CM | POA: Diagnosis present

## 2023-05-17 MED ORDER — BORTEZOMIB CHEMO SQ INJECTION 3.5 MG (2.5MG/ML)
1.3000 mg/m2 | Freq: Once | INTRAMUSCULAR | Status: AC
  Administered 2023-05-17: 2.5 mg via SUBCUTANEOUS
  Filled 2023-05-17: qty 1

## 2023-05-17 MED ORDER — PROCHLORPERAZINE MALEATE 10 MG PO TABS
10.0000 mg | ORAL_TABLET | Freq: Once | ORAL | Status: AC
  Administered 2023-05-17: 10 mg via ORAL
  Filled 2023-05-17: qty 1

## 2023-05-17 NOTE — Patient Instructions (Signed)
[place] [facility] AT [facility]ION[facility]  Discharge Instructions: Thank you for choosing [facility] to provide your oncology and hematology care.  If you have a lab appointment with the Cancer Center, please go directly to the Cancer Center and check in at the registration area.  Wear comfortable clothing and clothing appropriate for easy access to any Portacath or PICC line.   We strive to give you quality time with your provider. You may need to reschedule your appointment if you arrive late (15 or more minutes).  Arriving late affects you and other patients whose appointments are after yours.  Also, if you miss three or more appointments without notifying the office, you may be dismissed from the clinic at the provider's discretion.      For prescription refill requests, have your pharmacy contact our office and allow 72 hours for refills to be completed.    Today you received the following chemotherapy and/or immunotherapy agents:bortezomib SQ    To help prevent nausea and vomiting after your treatment, we encourage you to take your nausea medication as directed.  BELOW ARE SYMPTOMS THAT SHOULD BE REPORTED IMMEDIATELY: *FEVER GREATER THAN 100.4 F (38 C) OR HIGHER *CHILLS OR SWEATING *NAUSEA AND VOMITING THAT IS NOT CONTROLLED WITH YOUR NAUSEA MEDICATION *UNUSUAL SHORTNESS OF BREATH *UNUSUAL BRUISING OR BLEEDING *URINARY PROBLEMS (pain or burning when urinating, or frequent urination) *BOWEL PROBLEMS (unusual diarrhea, constipation, pain near the anus) TENDERNESS IN MOUTH AND THROAT WITH OR WITHOUT PRESENCE OF ULCERS (sore throat, sores in mouth, or a toothache) UNUSUAL RASH, SWELLING OR PAIN  UNUSUAL VAGINAL DISCHARGE OR ITCHING   Items with * indicate a potential emergency and should be followed up as soon as possible or go to the Emergency Department if any problems should occur.  Please show the CHEMOTHERAPY ALERT CARD or IMMUNOTHERAPY ALERT CARD at check-in  to the Emergency Department and triage nurse.  Should you have questions after your visit or need to cancel or reschedule your appointment, please contact [place] [facility]  [phone] and follow the prompts.  Office hours are 8:00 a.m. to 4:30 p.m. Monday - Friday. Please note that voicemails left after 4:00 p.m. may not be returned until the following business day.  We are closed weekends and major holidays. You have access to a nurse at all times for urgent questions. Please call the main number to the clinic [phone] and follow the prompts.  For any non-urgent questions, you may also contact your provider using MyChart. We now offer e-Visits for anyone 56 and older to request care online for non-urgent symptoms. For details visit [email or web address].   Also download the MyChart app! Go to the app store, search "MyChart", open the app, select [place], and log in with your MyChart username and password.

## 2023-05-19 ENCOUNTER — Encounter: Payer: Self-pay | Admitting: Oncology

## 2023-05-20 ENCOUNTER — Inpatient Hospital Stay: Payer: Medicare HMO

## 2023-05-20 ENCOUNTER — Encounter: Payer: Self-pay | Admitting: Oncology

## 2023-05-20 ENCOUNTER — Inpatient Hospital Stay (HOSPITAL_BASED_OUTPATIENT_CLINIC_OR_DEPARTMENT_OTHER): Payer: Medicare HMO | Admitting: Oncology

## 2023-05-20 ENCOUNTER — Inpatient Hospital Stay: Payer: Medicare HMO | Attending: Oncology

## 2023-05-20 VITALS — BP 152/89 | HR 97 | Temp 97.4°F | Resp 18 | Wt 177.2 lb

## 2023-05-20 DIAGNOSIS — Z5111 Encounter for antineoplastic chemotherapy: Secondary | ICD-10-CM | POA: Diagnosis not present

## 2023-05-20 DIAGNOSIS — C9000 Multiple myeloma not having achieved remission: Secondary | ICD-10-CM

## 2023-05-20 DIAGNOSIS — N1832 Chronic kidney disease, stage 3b: Secondary | ICD-10-CM | POA: Diagnosis not present

## 2023-05-20 DIAGNOSIS — Z5112 Encounter for antineoplastic immunotherapy: Secondary | ICD-10-CM | POA: Diagnosis not present

## 2023-05-20 LAB — CMP (CANCER CENTER ONLY)
ALT: 43 U/L (ref 0–44)
AST: 18 U/L (ref 15–41)
Albumin: 3.2 g/dL — ABNORMAL LOW (ref 3.5–5.0)
Alkaline Phosphatase: 169 U/L — ABNORMAL HIGH (ref 38–126)
Anion gap: 5 (ref 5–15)
BUN: 32 mg/dL — ABNORMAL HIGH (ref 8–23)
CO2: 26 mmol/L (ref 22–32)
Calcium: 9 mg/dL (ref 8.9–10.3)
Chloride: 104 mmol/L (ref 98–111)
Creatinine: 1.47 mg/dL — ABNORMAL HIGH (ref 0.61–1.24)
GFR, Estimated: 53 mL/min — ABNORMAL LOW (ref >60)
Glucose, Bld: 207 mg/dL — ABNORMAL HIGH (ref 70–99)
Potassium: 3.9 mmol/L (ref 3.5–5.1)
Sodium: 135 mmol/L (ref 135–145)
Total Bilirubin: 0.4 mg/dL (ref 0.3–1.2)
Total Protein: 9 g/dL — ABNORMAL HIGH (ref 6.5–8.1)

## 2023-05-20 LAB — CBC WITH DIFFERENTIAL (CANCER CENTER ONLY)
Abs Immature Granulocytes: 0.02 10*3/uL (ref 0.00–0.07)
Basophils Absolute: 0 10*3/uL (ref 0.0–0.1)
Basophils Relative: 1 %
Eosinophils Absolute: 0.1 10*3/uL (ref 0.0–0.5)
Eosinophils Relative: 3 %
HCT: 30.6 % — ABNORMAL LOW (ref 39.0–52.0)
Hemoglobin: 10 g/dL — ABNORMAL LOW (ref 13.0–17.0)
Immature Granulocytes: 1 %
Lymphocytes Relative: 14 %
Lymphs Abs: 0.4 10*3/uL — ABNORMAL LOW (ref 0.7–4.0)
MCH: 32.8 pg (ref 26.0–34.0)
MCHC: 32.7 g/dL (ref 30.0–36.0)
MCV: 100.3 fL — ABNORMAL HIGH (ref 80.0–100.0)
Monocytes Absolute: 0.2 10*3/uL (ref 0.1–1.0)
Monocytes Relative: 7 %
Neutro Abs: 2.2 10*3/uL (ref 1.7–7.7)
Neutrophils Relative %: 74 %
Platelet Count: 121 10*3/uL — ABNORMAL LOW (ref 150–400)
RBC: 3.05 MIL/uL — ABNORMAL LOW (ref 4.22–5.81)
RDW: 17.1 % — ABNORMAL HIGH (ref 11.5–15.5)
WBC Count: 3 10*3/uL — ABNORMAL LOW (ref 4.0–10.5)
nRBC: 0 % (ref 0.0–0.2)

## 2023-05-20 MED ORDER — DARATUMUMAB-HYALURONIDASE-FIHJ 1800-30000 MG-UT/15ML ~~LOC~~ SOLN
1800.0000 mg | Freq: Once | SUBCUTANEOUS | Status: AC
  Administered 2023-05-20: 1800 mg via SUBCUTANEOUS
  Filled 2023-05-20: qty 15

## 2023-05-20 MED ORDER — ACETAMINOPHEN 325 MG PO TABS
650.0000 mg | ORAL_TABLET | Freq: Once | ORAL | Status: AC
  Administered 2023-05-20: 650 mg via ORAL
  Filled 2023-05-20: qty 2

## 2023-05-20 MED ORDER — DIPHENHYDRAMINE HCL 25 MG PO CAPS
50.0000 mg | ORAL_CAPSULE | Freq: Once | ORAL | Status: AC
  Administered 2023-05-20: 50 mg via ORAL
  Filled 2023-05-20: qty 2

## 2023-05-20 MED ORDER — VITAMIN D 25 MCG (1000 UNIT) PO TABS: 1000.0000 [IU] | ORAL_TABLET | Freq: Every day | ORAL | Status: AC

## 2023-05-20 MED ORDER — DEXAMETHASONE 4 MG PO TABS
20.0000 mg | ORAL_TABLET | Freq: Once | ORAL | Status: AC
  Administered 2023-05-20: 20 mg via ORAL
  Filled 2023-05-20: qty 5

## 2023-05-20 MED ORDER — DENOSUMAB 120 MG/1.7ML ~~LOC~~ SOLN
120.0000 mg | Freq: Once | SUBCUTANEOUS | Status: AC
  Administered 2023-05-20: 120 mg via SUBCUTANEOUS
  Filled 2023-05-20: qty 1.7

## 2023-05-20 MED ORDER — CALCIUM CARBONATE 600 MG PO TABS: 1200.0000 mg | ORAL_TABLET | Freq: Every day | ORAL | Status: AC

## 2023-05-20 MED ORDER — BORTEZOMIB CHEMO SQ INJECTION 3.5 MG (2.5MG/ML)
1.3000 mg/m2 | Freq: Once | INTRAMUSCULAR | Status: AC
  Administered 2023-05-20: 2.5 mg via SUBCUTANEOUS
  Filled 2023-05-20: qty 1

## 2023-05-20 MED ORDER — INFLUENZA VAC A&B SURF ANT ADJ 0.5 ML IM SUSY
0.5000 mL | PREFILLED_SYRINGE | Freq: Once | INTRAMUSCULAR | Status: AC
  Administered 2023-05-20: 0.5 mL via INTRAMUSCULAR
  Filled 2023-05-20: qty 0.5

## 2023-05-20 NOTE — Assessment & Plan Note (Signed)
Cr is improving  Encourage oral hydration and avoid nephrotoxins.

## 2023-05-20 NOTE — Assessment & Plan Note (Addendum)
Bone marrow biopsy was reviewed. 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 Proceed with Daratumumab and Velcade  today. He gets Dexamethasone 20mg  weekly prior to Daratumumab treatments.  Daratumumab on 9/13/  Continue Revlimid till 9/13. Continue Acyclovir, Aspirin 81mg   Proceed with Xegeva today. Rationale and side effects were reviewed with patient and he agrees.  Recommend calcium and vitamin D supplementation.

## 2023-05-20 NOTE — Progress Notes (Signed)
Hematology/Oncology Progress note [phone] [phone]         Patient Care Team: Danella Penton, MD as PCP - General (Internal Medicine) [name], MD as Consulting Physician (Oncology)   REFERRING PROVIDER: [name], MD  CHIEF COMPLAINTS/REASON FOR VISIT:  Multiple myeloma   ASSESSMENT & PLAN:   Cancer Staging  Multiple myeloma [facility]) Staging form: Plasma Cell Myeloma and Plasma Cell Disorders, AJCC 8th Edition - Clinical stage from 04/14/2023: RISS Stage II (Beta-2-microglobulin (mg/L): 8.4, Albumin (g/dL): 2.6, ISS: Stage III, High-risk cytogenetics: Absent, LDH: Normal) - Signed by [name], MD on 04/22/2023   Multiple myeloma (HCC) Bone marrow biopsy was reviewed. 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 Proceed with Daratumumab and Velcade  today. He gets Dexamethasone 20mg  weekly prior to Daratumumab treatments.  Daratumumab on 9/13/  Continue Revlimid till 9/13. Continue Acyclovir, Aspirin 81mg   Proceed with Xegeva today. Rationale and side effects were reviewed with patient and he agrees.  Recommend calcium and vitamin D supplementation.   CKD (chronic kidney disease) stage 3, GFR 30-59 ml/min (HCC) Cr is improving  Encourage oral hydration and avoid nephrotoxins.    Encounter for antineoplastic chemotherapy Chemotherapy plan as listed above.  Discussed about antiemetics instruction.   No orders of the defined types were placed in this encounter.  Follow-up  1 weeks  We spent sufficient time to discuss many aspect of care, questions were answered to patient's satisfaction.    [name], MD, PhD [facility] Hematology Oncology 05/20/2023     HISTORY OF PRESENTING ILLNESS:  Victor Phillips is a  67 y.o.  male with PMH listed below who was referred to me for anemia  02/11/2023 - 02/14/2023  recent hospitalization due to pneumonia, respiratory failure.  He was found to have a hemoglobin decreased at 6.8, status post PRBC transfusion during admission.  EGD showed gastritis.  Colonoscopy was not remarkable. 02/11/2023 TIBC 221 ferritin 107, iron saturation 16. Patient is currently taking fusion plus Vitamin B12 level in the 300s. His echo showed grade 2 diastolic CHF.  He denies recent chest pain on exertion, shortness of breath on minimal exertion, pre-syncopal episodes, or palpitations He had not noticed any recent bleeding such as epistaxis, hematuria or hematochezia.  He denies over the counter NSAID ingestion.  Oncology History  Multiple myeloma (HCC)  04/14/2023 Initial Diagnosis   Multiple myeloma   03/13/2020 multiple myeloma panel showed M protein 4.3, IgA lambda Free lamda Level 142,-light chain ratio 0.07 04/05/2023 bone marrow biopsy showed hypercellular bone marrow with plasma cell neoplasm.The bone marrow is hypercellular for age with prominent increase in plasma cells representing 65% of all cells in the aspirate associated with interstitial infiltrates and numerous variably sized aggregates in  the clot and biopsy sections.  The plasma cells display lambda light chain restriction consistent with plasma cell neoplasm.  Normal cytogenetics.  FISH studies pending.    04/14/2023 Cancer Staging   Staging form: Plasma Cell Myeloma and Plasma Cell Disorders, AJCC 8th Edition - Clinical stage from 04/14/2023: RISS Stage II (Beta-2-microglobulin (mg/L): 8.4, Albumin (g/dL): 2.6, ISS: Stage III, High-risk cytogenetics: Absent, LDH: Normal) - Signed by [name], MD on 04/22/2023 Stage prefix: Initial diagnosis Beta 2 microglobulin range (mg/L): Greater than or equal to 5.5 Albumin range (g/dL): Less than 3.5 Cytogenetics: 1q addition, Other mutation  04/14/2023 Imaging   Skeletal survey 1. No lytic lesions or other intrinsic bony abnormality. 2. Borderline cardiomegaly with an  interval decrease in size. 3. Diffuse atheromatous arterial calcifications including bilateral carotid artery calcifications, right greater than left   04/22/2023 -  Chemotherapy   Patient is on Treatment Plan : MYELOMA NEWLY DIAGNOSED TRANSPLANT CANDIDATE DaraVRd (Daratumumab SQ) q21d x 6 Cycles (Induction/Consolidation)      He takes Aspirin and Acyclovir  He reports to be complaint with his medication. Glipizide for DM.  No new complaints. Gained weight.  No nausea vomiting diarrhea.  He feels well.   MEDICAL HISTORY:  Past Medical History:  Diagnosis Date   Diabetes mellitus without complication (HCC)    Hypertension     SURGICAL HISTORY: Past Surgical History:  Procedure Laterality Date   COLONOSCOPY N/A 02/13/2023   Procedure: COLONOSCOPY;  Surgeon: Toledo, Boykin Nearing, MD;  Location: [facility] ENDOSCOPY;  Service: Gastroenterology;  Laterality: N/A;   COLONOSCOPY N/A 02/14/2023   Procedure: COLONOSCOPY;  Surgeon: Toledo, Boykin Nearing, MD;  Location: [facility] ENDOSCOPY;  Service: Gastroenterology;  Laterality: N/A;   ESOPHAGOGASTRODUODENOSCOPY N/A 02/13/2023   Procedure: ESOPHAGOGASTRODUODENOSCOPY (EGD);  Surgeon: Toledo, Boykin Nearing, MD;  Location: [facility] ENDOSCOPY;  Service: Gastroenterology;  Laterality: N/A;    SOCIAL HISTORY: Social History   Socioeconomic History   Marital status: Married    Spouse name: Not on file   Number of children: Not on file   Years of education: Not on file   Highest education level: Not on file  Occupational History   Not on file  Tobacco Use   Smoking status: Never   Smokeless tobacco: Never  Vaping Use   Vaping status: Never Used  Substance and Sexual Activity   Alcohol use: Not Currently    Comment: quit approx 2001   Drug use: No   Sexual activity: Not on file  Other Topics Concern   Not on file  Social History Narrative   Not on file   Social Determinants of Health   Financial Resource Strain: Low Risk  (05/05/2023)   Received from [facility]   Overall Financial Resource Strain (CARDIA)    Difficulty of Paying Living Expenses: Not hard at all  Food Insecurity: No Food Insecurity (05/05/2023)   Received from [facility]   Hunger Vital Sign    Worried About Running Out of Food in the Last Year: Never true    Ran Out of Food in the Last Year: Never true  Transportation Needs: No Transportation Needs (05/05/2023)   Received from [facility] - Transportation    In the past 12 months, has lack of transportation kept you from medical appointments or from getting medications?: No    Lack of Transportation (Non-Medical): No  Physical Activity: Sufficiently Active (09/24/2020)   Received from [facility], [facility]   Exercise Vital Sign    Days of Exercise per Week: 6 days    Minutes of Exercise per Session: 30 min  Stress: No Stress Concern Present (09/24/2020)   Received from [facility], [facility]   [employer] of Occupational Health - Occupational Stress Questionnaire    Feeling of Stress : Not at all  Social Connections: Socially Integrated (09/24/2020)   Received from [facility], [facility]   Social Connection and Isolation Panel [NHANES]    Frequency of Communication with Friends and Family:  More than three times a week    Frequency of Social Gatherings with Friends and Family: Once a week    Attends Religious Services: More than 4 times per year    Active Member of [employer] or Organizations: Yes    Attends Engineer, structural: More than 4 times per year    Marital Status: Married  Catering manager Violence: Not At Risk (03/14/2023)   Humiliation, Afraid, Rape, and Kick questionnaire    Fear of Current or Ex-Partner: No    Emotionally Abused: No    Physically Abused: No    Sexually Abused: No    FAMILY HISTORY: Family History   Problem Relation Age of Onset   Diabetes Mother    Heart attack Father     ALLERGIES:  has No Known Allergies.  MEDICATIONS:  Current Outpatient Medications  Medication Sig Dispense Refill   acetaminophen (TYLENOL) 325 MG tablet Take 2 tablets (650 mg total) by mouth every 6 (six) hours as needed for mild pain (or Fever >/= 101). 90 tablet 1   acyclovir (ZOVIRAX) 400 MG tablet Take 1 tablet (400 mg total) by mouth 2 (two) times daily. 60 tablet 5   aspirin EC 81 MG tablet Take 1 tablet (81 mg total) by mouth daily. 100 tablet 3   atorvastatin (LIPITOR) 10 MG tablet Take 1 tablet by mouth daily.     calcium carbonate (OS-CAL) 600 MG TABS tablet Take 2 tablets (1,200 mg total) by mouth daily.     cholecalciferol (VITAMIN D3) 25 MCG (1000 UNIT) tablet Take 1 tablet (1,000 Units total) by mouth daily.     glipiZIDE (GLUCOTROL XL) 2.5 MG 24 hr tablet Take by mouth.     lenalidomide (REVLIMID) 10 MG capsule Take 1 capsule (10 mg total) by mouth daily. Take for 14 days, then hold for 7 days off. Repeat every 21 days. 14 capsule 0   Multiple Vitamin (MULTIVITAMIN WITH MINERALS) TABS tablet Take 1 tablet by mouth daily. 90 tablet 1   olmesartan-hydrochlorothiazide (BENICAR HCT) 20-12.5 MG tablet Take 1 tablet by mouth daily.     ondansetron (ZOFRAN) 8 MG tablet Take 1 tablet (8 mg total) by mouth every 8 (eight) hours as needed for nausea or vomiting. 30 tablet 1   prochlorperazine (COMPAZINE) 10 MG tablet Take 1 tablet (10 mg total) by mouth every 6 (six) hours as needed for nausea or vomiting. 30 tablet 1   senna (SENOKOT) 8.6 MG TABS tablet Take 2 tablets (17.2 mg total) by mouth daily. 120 tablet 0   temazepam (RESTORIL) 15 MG capsule Take by mouth.     No current facility-administered medications for this visit.    Review of Systems  Constitutional:  Positive for fatigue. Negative for appetite change, chills, fever and unexpected weight change.  HENT:   Negative for hearing loss and voice  change.   Eyes:  Negative for eye problems and icterus.  Respiratory:  Negative for chest tightness, cough and shortness of breath.   Cardiovascular:  Negative for chest pain and leg swelling.  Gastrointestinal:  Negative for abdominal distention and abdominal pain.  Endocrine: Negative for hot flashes.  Genitourinary:  Negative for difficulty urinating, dysuria and frequency.   Musculoskeletal:  Negative for arthralgias.  Skin:  Negative for itching and rash.  Neurological:  Negative for light-headedness and numbness.  Hematological:  Negative for adenopathy. Does not bruise/bleed easily.  Psychiatric/Behavioral:  Negative for confusion.     PHYSICAL EXAMINATION: Vitals:   05/20/23 0981  BP: (!) 152/89  Pulse: 97  Resp: 18  Temp: (!) 97.4 F (36.3 C)   Filed Weights   05/20/23 0843  Weight: 177 lb 3.2 oz (80.4 kg)    Physical Exam Constitutional:      General: He is not in acute distress. HENT:     Head: Normocephalic and atraumatic.  Eyes:     General: No scleral icterus. Cardiovascular:     Rate and Rhythm: Normal rate and regular rhythm.     Heart sounds: Normal heart sounds.  Pulmonary:     Effort: Pulmonary effort is normal. No respiratory distress.     Breath sounds: No wheezing.  Abdominal:     General: Bowel sounds are normal. There is no distension.     Palpations: Abdomen is soft.  Musculoskeletal:        General: No deformity. Normal range of motion.     Cervical back: Normal range of motion and neck supple.  Skin:    General: Skin is warm and dry.     Findings: No erythema or rash.  Neurological:     Mental Status: He is alert and oriented to person, place, and time. Mental status is at baseline.     Cranial Nerves: No cranial nerve deficit.     Coordination: Coordination normal.  Psychiatric:        Mood and Affect: Mood normal.      LABORATORY DATA:  I have reviewed the data as listed    Latest Ref Rng & Units 05/20/2023    8:19 AM  05/13/2023    8:01 AM 05/06/2023    8:38 AM  CBC  WBC 4.0 - 10.5 K/uL 3.0  5.1  3.9   Hemoglobin 13.0 - 17.0 g/dL 09.8  9.3  9.1   Hematocrit 39.0 - 52.0 % 30.6  28.4  28.1   Platelets 150 - 400 K/uL 121  116  85       Latest Ref Rng & Units 05/20/2023    8:19 AM 05/13/2023    8:01 AM 05/06/2023    8:38 AM  CMP  Glucose 70 - 99 mg/dL 119  147  829   BUN 8 - 23 mg/dL 32  29  35   Creatinine 0.61 - 1.24 mg/dL 5.62  1.30  8.65   Sodium 135 - 145 mmol/L 135  132  135   Potassium 3.5 - 5.1 mmol/L 3.9  4.7  4.3   Chloride 98 - 111 mmol/L 104  104  107   CO2 22 - 32 mmol/L 26  22  21    Calcium 8.9 - 10.3 mg/dL 9.0  8.5  8.7   Total Protein 6.5 - 8.1 g/dL 9.0  8.8  8.9   Total Bilirubin 0.3 - 1.2 mg/dL 0.4  0.4  0.3   Alkaline Phos 38 - 126 U/L 169  113  120   AST 15 - 41 U/L 18  21  27    ALT 0 - 44 U/L 43  56  71    Lab Results  Component Value Date   IRON 100 03/14/2023   TIBC 272 03/14/2023   IRONPCTSAT 37 03/14/2023   FERRITIN 249 03/14/2023     RADIOGRAPHIC STUDIES: I have personally reviewed the radiological images as listed and agreed with the findings in the report. NM PET Image Initial (PI) Whole Body  Result Date: 04/29/2023 CLINICAL DATA:  Initial treatment strategy for multiple myeloma. EXAM: NUCLEAR MEDICINE PET WHOLE BODY TECHNIQUE: 9.8 mCi  F-18 FDG was injected intravenously. Full-ring PET imaging was performed from the head to foot after the radiotracer. CT data was obtained and used for attenuation correction and anatomic localization. Fasting blood glucose: 101 mg/dl COMPARISON:  Bone survey 04/14/2023, CT chest abdomen pelvis 02/11/2023. FINDINGS: Mediastinal blood pool activity: SUV max 1.8 HEAD/NECK: No abnormal hypermetabolism. Incidental CT findings: None. CHEST: No abnormal hypermetabolism. Incidental CT findings: Atherosclerotic calcification of the aorta, aortic valve and coronary arteries. Heart is enlarged. No pericardial or pleural effusion. ABDOMEN/PELVIS: No  abnormal hypermetabolism. Incidental CT findings: Liver, gallbladder, adrenal glands, kidneys, spleen, pancreas, stomach and bowel are grossly unremarkable. SKELETON: Tiny hypermetabolic lytic lesions in the posteromedial aspects of the left eighth and ninth ribs (4/86 and 92), SUV max 4.0. No additional abnormal hypermetabolism. Incidental CT findings: Degenerative changes in the spine. EXTREMITIES: No abnormal hypermetabolism. Incidental CT findings: None. IMPRESSION: 1. Two tiny hypermetabolic lucent lesions in the left posteromedial eighth and ninth ribs. No additional evidence of multiple myeloma. 2. Aortic atherosclerosis (ICD10-I70.0). Coronary artery calcification. Electronically Signed   By: Leanna Battles M.D.   On: 04/29/2023 12:05

## 2023-05-20 NOTE — Patient Instructions (Signed)
[facility]  Discharge Instructions: Thank you for choosing [facility] to provide your oncology and hematology care.  If you have a lab appointment with the Cancer Center, please go directly to the Cancer Center and check in at the registration area.  Wear comfortable clothing and clothing appropriate for easy access to any Portacath or PICC line.   We strive to give you quality time with your provider. You may need to reschedule your appointment if you arrive late (15 or more minutes).  Arriving late affects you and other patients whose appointments are after yours.  Also, if you miss three or more appointments without notifying the office, you may be dismissed from the clinic at the provider's discretion.      For prescription refill requests, have your pharmacy contact our office and allow 72 hours for refills to be completed.    Today you received the following chemotherapy and/or immunotherapy agents DARZALEX, VELCADE      To help prevent nausea and vomiting after your treatment, we encourage you to take your nausea medication as directed.  BELOW ARE SYMPTOMS THAT SHOULD BE REPORTED IMMEDIATELY: *FEVER GREATER THAN 100.4 F (38 C) OR HIGHER *CHILLS OR SWEATING *NAUSEA AND VOMITING THAT IS NOT CONTROLLED WITH YOUR NAUSEA MEDICATION *UNUSUAL SHORTNESS OF BREATH *UNUSUAL BRUISING OR BLEEDING *URINARY PROBLEMS (pain or burning when urinating, or frequent urination) *BOWEL PROBLEMS (unusual diarrhea, constipation, pain near the anus) TENDERNESS IN MOUTH AND THROAT WITH OR WITHOUT PRESENCE OF ULCERS (sore throat, sores in mouth, or a toothache) UNUSUAL RASH, SWELLING OR PAIN  UNUSUAL VAGINAL DISCHARGE OR ITCHING   Items with * indicate a potential emergency and should be followed up as soon as possible or go to the Emergency Department if any problems should occur.  Please show the CHEMOTHERAPY ALERT CARD or IMMUNOTHERAPY ALERT CARD at  check-in to the Emergency Department and triage nurse.  Should you have questions after your visit or need to cancel or reschedule your appointment, please contact [facility]  [phone] and follow the prompts.  Office hours are 8:00 a.m. to 4:30 p.m. Monday - Friday. Please note that voicemails left after 4:00 p.m. may not be returned until the following business day.  We are closed weekends and major holidays. You have access to a nurse at all times for urgent questions. Please call the main number to the clinic [phone] and follow the prompts.  For any non-urgent questions, you may also contact your provider using MyChart. We now offer e-Visits for anyone 92 and older to request care online for non-urgent symptoms. For details visit [email or web address].   Also download the MyChart app! Go to the app store, search "MyChart", open the app, select [facility], and log in with your MyChart username and password.  Daratumumab; Hyaluronidase Injection What is this medication? DARATUMUMAB; HYALURONIDASE (dar a toom ue mab; hye al ur ON i dase) treats multiple myeloma, a type of bone marrow cancer. Daratumumab works by blocking a protein that causes cancer cells to grow and multiply. This helps to slow or stop the spread of cancer cells. Hyaluronidase works by increasing the absorption of other medications in the body to help them work better. This medication may also be used treat amyloidosis, a condition that causes the buildup of a protein (amyloid) in your body. It works by reducing the buildup of this protein, which decreases symptoms. It is a combination medication that contains a monoclonal  antibody. This medicine may be used for other purposes; ask your health care provider or pharmacist if you have questions. COMMON BRAND NAME(S): DARZALEX FASPRO What should I tell my care team before I take this medication? They need to know if you have any of these  conditions: Heart disease Infection, such as chickenpox, cold sores, herpes, hepatitis B Lung or breathing disease An unusual or allergic reaction to daratumumab, hyaluronidase, other medications, foods, dyes, or preservatives Pregnant or trying to get pregnant Breast-feeding How should I use this medication? This medication is injected under the skin. It is given by your care team in a hospital or clinic setting. Talk to your care team about the use of this medication in children. Special care may be needed. Overdosage: If you think you have taken too much of this medicine contact a poison control center or emergency room at once. NOTE: This medicine is only for you. Do not share this medicine with others. What if I miss a dose? Keep appointments for follow-up doses. It is important not to miss your dose. Call your care team if you are unable to keep an appointment. What may interact with this medication? Interactions have not been studied. This list may not describe all possible interactions. Give your health care provider a list of all the medicines, herbs, non-prescription drugs, or dietary supplements you use. Also tell them if you smoke, drink alcohol, or use illegal drugs. Some items may interact with your medicine. What should I watch for while using this medication? Your condition will be monitored carefully while you are receiving this medication. This medication can cause serious allergic reactions. To reduce your risk, your care team may give you other medication to take before receiving this one. Be sure to follow the directions from your care team. This medication can affect the results of blood tests to match your blood type. These changes can last for up to 6 months after the final dose. Your care team will do blood tests to match your blood type before you start treatment. Tell all of your care team that you are being treated with this medication before receiving a blood  transfusion. This medication can affect the results of some tests used to determine treatment response; extra tests may be needed to evaluate response. Talk to your care team if you wish to become pregnant or think you are pregnant. This medication can cause serious birth defects if taken during pregnancy and for 3 months after the last dose. A reliable form of contraception is recommended while taking this medication and for 3 months after the last dose. Talk to your care team about effective forms of contraception. Do not breast-feed while taking this medication. What side effects may I notice from receiving this medication? Side effects that you should report to your care team as soon as possible: Allergic reactions--skin rash, itching, hives, swelling of the face, lips, tongue, or throat Heart rhythm changes--fast or irregular heartbeat, dizziness, feeling faint or lightheaded, chest pain, trouble breathing Infection--fever, chills, cough, sore throat, wounds that don't heal, pain or trouble when passing urine, general feeling of discomfort or being unwell Infusion reactions--chest pain, shortness of breath or trouble breathing, feeling faint or lightheaded Sudden eye pain or change in vision such as blurry vision, seeing halos around lights, vision loss Unusual bruising or bleeding Side effects that usually do not require medical attention (report to your care team if they continue or are bothersome): Constipation Diarrhea Fatigue Nausea Pain, tingling, or  numbness in the hands or feet Swelling of the ankles, hands, or feet This list may not describe all possible side effects. Call your doctor for medical advice about side effects. You may report side effects to FDA at 1-800-FDA-1088. Where should I keep my medication? This medication is given in a hospital or clinic. It will not be stored at home. NOTE: This sheet is a summary. It may not cover all possible information. If you have  questions about this medicine, talk to your doctor, pharmacist, or health care provider.  2024 Elsevier/Gold Standard (2022-01-05 00:00:00)  Bortezomib Injection What is this medication? BORTEZOMIB (bor TEZ oh mib) treats lymphoma. It may also be used to treat multiple myeloma, a type of bone marrow cancer. It works by blocking a protein that causes cancer cells to grow and multiply. This helps to slow or stop the spread of cancer cells. This medicine may be used for other purposes; ask your health care provider or pharmacist if you have questions. COMMON BRAND NAME(S): Velcade What should I tell my care team before I take this medication? They need to know if you have any of these conditions: Dehydration Diabetes Heart disease Liver disease Tingling of the fingers or toes or other nerve disorder An unusual or allergic reaction to bortezomib, other medications, foods, dyes, or preservatives If you or your partner are pregnant or trying to get pregnant Breastfeeding How should I use this medication? This medication is injected into a vein or under the skin. It is given by your care team in a hospital or clinic setting. Talk to your care team about the use of this medication in children. Special care may be needed. Overdosage: If you think you have taken too much of this medicine contact a poison control center or emergency room at once. NOTE: This medicine is only for you. Do not share this medicine with others. What if I miss a dose? Keep appointments for follow-up doses. It is important not to miss your dose. Call your care team if you are unable to keep an appointment. What may interact with this medication? Ketoconazole Rifampin This list may not describe all possible interactions. Give your health care provider a list of all the medicines, herbs, non-prescription drugs, or dietary supplements you use. Also tell them if you smoke, drink alcohol, or use illegal drugs. Some items may  interact with your medicine. What should I watch for while using this medication? Your condition will be monitored carefully while you are receiving this medication. You may need blood work while taking this medication. This medication may affect your coordination, reaction time, or judgment. Do not drive or operate machinery until you know how this medication affects you. Sit up or stand slowly to reduce the risk of dizzy or fainting spells. Drinking alcohol with this medication can increase the risk of these side effects. This medication may increase your risk of getting an infection. Call your care team for advice if you get a fever, chills, sore throat, or other symptoms of a cold or flu. Do not treat yourself. Try to avoid being around people who are sick. Check with your care team if you have severe diarrhea, nausea, and vomiting, or if you sweat a lot. The loss of too much body fluid may make it dangerous for you to take this medication. Talk to your care team if you may be pregnant. Serious birth defects can occur if you take this medication during pregnancy and for 7 months after the  last dose. You will need a negative pregnancy test before starting this medication. Contraception is recommended while taking this medication and for 7 months after the last dose. Your care team can help you find the option that works for you. If your partner can get pregnant, use a condom during sex while taking this medication and for 4 months after the last dose. Do not breastfeed while taking this medication and for 2 months after the last dose. This medication may cause infertility. Talk to your care team if you are concerned about your fertility. What side effects may I notice from receiving this medication? Side effects that you should report to your care team as soon as possible: Allergic reactions--skin rash, itching, hives, swelling of the face, lips, tongue, or throat Bleeding--bloody or black, tar-like  stools, vomiting blood or brown material that looks like coffee grounds, red or dark brown urine, small red or purple spots on skin, unusual bruising or bleeding Bleeding in the brain--severe headache, stiff neck, confusion, dizziness, change in vision, numbness or weakness of the face, arm, or leg, trouble speaking, trouble walking, vomiting Bowel blockage--stomach cramping, unable to have a bowel movement or pass gas, loss of appetite, vomiting Heart failure--shortness of breath, swelling of the ankles, feet, or hands, sudden weight gain, unusual weakness or fatigue Infection--fever, chills, cough, sore throat, wounds that don't heal, pain or trouble when passing urine, general feeling of discomfort or being unwell Liver injury--right upper belly pain, loss of appetite, nausea, light-colored stool, dark yellow or brown urine, yellowing skin or eyes, unusual weakness or fatigue Low blood pressure--dizziness, feeling faint or lightheaded, blurry vision Lung injury--shortness of breath or trouble breathing, cough, spitting up blood, chest pain, fever Pain, tingling, or numbness in the hands or feet Severe or prolonged diarrhea Stomach pain, bloody diarrhea, pale skin, unusual weakness or fatigue, decrease in the amount of urine, which may be signs of hemolytic uremic syndrome Sudden and severe headache, confusion, change in vision, seizures, which may be signs of posterior reversible encephalopathy syndrome (PRES) TTP--purple spots on the skin or inside the mouth, pale skin, yellowing skin or eyes, unusual weakness or fatigue, fever, fast or irregular heartbeat, confusion, change in vision, trouble speaking, trouble walking Tumor lysis syndrome (TLS)--nausea, vomiting, diarrhea, decrease in the amount of urine, dark urine, unusual weakness or fatigue, confusion, muscle pain or cramps, fast or irregular heartbeat, joint pain Side effects that usually do not require medical attention (report to your care  team if they continue or are bothersome): Constipation Diarrhea Fatigue Loss of appetite Nausea This list may not describe all possible side effects. Call your doctor for medical advice about side effects. You may report side effects to FDA at 1-800-FDA-1088. Where should I keep my medication? This medication is given in a hospital or clinic. It will not be stored at home. NOTE: This sheet is a summary. It may not cover all possible information. If you have questions about this medicine, talk to your doctor, pharmacist, or health care provider.  2024 Elsevier/Gold Standard (2022-02-02 00:00:00)

## 2023-05-20 NOTE — Assessment & Plan Note (Signed)
 Chemotherapy plan as listed above.   Discussed about antiemetics instruction.

## 2023-05-21 ENCOUNTER — Other Ambulatory Visit: Payer: Self-pay

## 2023-05-23 ENCOUNTER — Encounter: Payer: Self-pay | Admitting: Oncology

## 2023-05-23 ENCOUNTER — Inpatient Hospital Stay: Payer: Medicare HMO

## 2023-05-23 VITALS — BP 159/98 | HR 62 | Temp 96.5°F | Resp 18

## 2023-05-23 DIAGNOSIS — Z5112 Encounter for antineoplastic immunotherapy: Secondary | ICD-10-CM | POA: Diagnosis not present

## 2023-05-23 DIAGNOSIS — C9000 Multiple myeloma not having achieved remission: Secondary | ICD-10-CM

## 2023-05-23 LAB — KAPPA/LAMBDA LIGHT CHAINS
Kappa free light chain: 11.1 mg/L (ref 3.3–19.4)
Kappa, lambda light chain ratio: 0.27 (ref 0.26–1.65)
Lambda free light chains: 41.7 mg/L — ABNORMAL HIGH (ref 5.7–26.3)

## 2023-05-23 MED ORDER — BORTEZOMIB CHEMO SQ INJECTION 3.5 MG (2.5MG/ML)
1.3000 mg/m2 | Freq: Once | INTRAMUSCULAR | Status: AC
  Administered 2023-05-23: 2.5 mg via SUBCUTANEOUS
  Filled 2023-05-23: qty 1

## 2023-05-23 MED ORDER — PROCHLORPERAZINE MALEATE 10 MG PO TABS
10.0000 mg | ORAL_TABLET | Freq: Once | ORAL | Status: AC
  Administered 2023-05-23: 10 mg via ORAL
  Filled 2023-05-23: qty 1

## 2023-05-23 NOTE — Patient Instructions (Signed)
[facility]  Discharge Instructions: Thank you for choosing [facility] to provide your oncology and hematology care.  If you have a lab appointment with the Cancer Center, please go directly to the Cancer Center and check in at the registration area.  Wear comfortable clothing and clothing appropriate for easy access to any Portacath or PICC line.   We strive to give you quality time with your provider. You may need to reschedule your appointment if you arrive late (15 or more minutes).  Arriving late affects you and other patients whose appointments are after yours.  Also, if you miss three or more appointments without notifying the office, you may be dismissed from the clinic at the provider's discretion.      For prescription refill requests, have your pharmacy contact our office and allow 72 hours for refills to be completed.    Today you received the following chemotherapy and/or immunotherapy agents VELCADE      To help prevent nausea and vomiting after your treatment, we encourage you to take your nausea medication as directed.  BELOW ARE SYMPTOMS THAT SHOULD BE REPORTED IMMEDIATELY: *FEVER GREATER THAN 100.4 F (38 C) OR HIGHER *CHILLS OR SWEATING *NAUSEA AND VOMITING THAT IS NOT CONTROLLED WITH YOUR NAUSEA MEDICATION *UNUSUAL SHORTNESS OF BREATH *UNUSUAL BRUISING OR BLEEDING *URINARY PROBLEMS (pain or burning when urinating, or frequent urination) *BOWEL PROBLEMS (unusual diarrhea, constipation, pain near the anus) TENDERNESS IN MOUTH AND THROAT WITH OR WITHOUT PRESENCE OF ULCERS (sore throat, sores in mouth, or a toothache) UNUSUAL RASH, SWELLING OR PAIN  UNUSUAL VAGINAL DISCHARGE OR ITCHING   Items with * indicate a potential emergency and should be followed up as soon as possible or go to the Emergency Department if any problems should occur.  Please show the CHEMOTHERAPY ALERT CARD or IMMUNOTHERAPY ALERT CARD at check-in to  the Emergency Department and triage nurse.  Should you have questions after your visit or need to cancel or reschedule your appointment, please contact [facility]  [phone] and follow the prompts.  Office hours are 8:00 a.m. to 4:30 p.m. Monday - Friday. Please note that voicemails left after 4:00 p.m. may not be returned until the following business day.  We are closed weekends and major holidays. You have access to a nurse at all times for urgent questions. Please call the main number to the clinic [phone] and follow the prompts.  For any non-urgent questions, you may also contact your provider using MyChart. We now offer e-Visits for anyone 8 and older to request care online for non-urgent symptoms. For details visit [email or web address].   Also download the MyChart app! Go to the app store, search "MyChart", open the app, select [facility], and log in with your MyChart username and password.  Bortezomib Injection What is this medication? BORTEZOMIB (bor TEZ oh mib) treats lymphoma. It may also be used to treat multiple myeloma, a type of bone marrow cancer. It works by blocking a protein that causes cancer cells to grow and multiply. This helps to slow or stop the spread of cancer cells. This medicine may be used for other purposes; ask your health care provider or pharmacist if you have questions. COMMON BRAND NAME(S): Velcade What should I tell my care team before I take this medication? They need to know if you have any of these conditions: Dehydration Diabetes Heart disease Liver disease Tingling of the fingers or toes or other nerve  disorder An unusual or allergic reaction to bortezomib, other medications, foods, dyes, or preservatives If you or your partner are pregnant or trying to get pregnant Breastfeeding How should I use this medication? This medication is injected into a vein or under the skin. It is given by your care team in a  hospital or clinic setting. Talk to your care team about the use of this medication in children. Special care may be needed. Overdosage: If you think you have taken too much of this medicine contact a poison control center or emergency room at once. NOTE: This medicine is only for you. Do not share this medicine with others. What if I miss a dose? Keep appointments for follow-up doses. It is important not to miss your dose. Call your care team if you are unable to keep an appointment. What may interact with this medication? Ketoconazole Rifampin This list may not describe all possible interactions. Give your health care provider a list of all the medicines, herbs, non-prescription drugs, or dietary supplements you use. Also tell them if you smoke, drink alcohol, or use illegal drugs. Some items may interact with your medicine. What should I watch for while using this medication? Your condition will be monitored carefully while you are receiving this medication. You may need blood work while taking this medication. This medication may affect your coordination, reaction time, or judgment. Do not drive or operate machinery until you know how this medication affects you. Sit up or stand slowly to reduce the risk of dizzy or fainting spells. Drinking alcohol with this medication can increase the risk of these side effects. This medication may increase your risk of getting an infection. Call your care team for advice if you get a fever, chills, sore throat, or other symptoms of a cold or flu. Do not treat yourself. Try to avoid being around people who are sick. Check with your care team if you have severe diarrhea, nausea, and vomiting, or if you sweat a lot. The loss of too much body fluid may make it dangerous for you to take this medication. Talk to your care team if you may be pregnant. Serious birth defects can occur if you take this medication during pregnancy and for 7 months after the last dose. You  will need a negative pregnancy test before starting this medication. Contraception is recommended while taking this medication and for 7 months after the last dose. Your care team can help you find the option that works for you. If your partner can get pregnant, use a condom during sex while taking this medication and for 4 months after the last dose. Do not breastfeed while taking this medication and for 2 months after the last dose. This medication may cause infertility. Talk to your care team if you are concerned about your fertility. What side effects may I notice from receiving this medication? Side effects that you should report to your care team as soon as possible: Allergic reactions--skin rash, itching, hives, swelling of the face, lips, tongue, or throat Bleeding--bloody or black, tar-like stools, vomiting blood or brown material that looks like coffee grounds, red or dark brown urine, small red or purple spots on skin, unusual bruising or bleeding Bleeding in the brain--severe headache, stiff neck, confusion, dizziness, change in vision, numbness or weakness of the face, arm, or leg, trouble speaking, trouble walking, vomiting Bowel blockage--stomach cramping, unable to have a bowel movement or pass gas, loss of appetite, vomiting Heart failure--shortness of breath, swelling of  the ankles, feet, or hands, sudden weight gain, unusual weakness or fatigue Infection--fever, chills, cough, sore throat, wounds that don't heal, pain or trouble when passing urine, general feeling of discomfort or being unwell Liver injury--right upper belly pain, loss of appetite, nausea, light-colored stool, dark yellow or brown urine, yellowing skin or eyes, unusual weakness or fatigue Low blood pressure--dizziness, feeling faint or lightheaded, blurry vision Lung injury--shortness of breath or trouble breathing, cough, spitting up blood, chest pain, fever Pain, tingling, or numbness in the hands or feet Severe  or prolonged diarrhea Stomach pain, bloody diarrhea, pale skin, unusual weakness or fatigue, decrease in the amount of urine, which may be signs of hemolytic uremic syndrome Sudden and severe headache, confusion, change in vision, seizures, which may be signs of posterior reversible encephalopathy syndrome (PRES) TTP--purple spots on the skin or inside the mouth, pale skin, yellowing skin or eyes, unusual weakness or fatigue, fever, fast or irregular heartbeat, confusion, change in vision, trouble speaking, trouble walking Tumor lysis syndrome (TLS)--nausea, vomiting, diarrhea, decrease in the amount of urine, dark urine, unusual weakness or fatigue, confusion, muscle pain or cramps, fast or irregular heartbeat, joint pain Side effects that usually do not require medical attention (report to your care team if they continue or are bothersome): Constipation Diarrhea Fatigue Loss of appetite Nausea This list may not describe all possible side effects. Call your doctor for medical advice about side effects. You may report side effects to FDA at 1-800-FDA-1088. Where should I keep my medication? This medication is given in a hospital or clinic. It will not be stored at home. NOTE: This sheet is a summary. It may not cover all possible information. If you have questions about this medicine, talk to your doctor, pharmacist, or health care provider.  2024 Elsevier/Gold Standard (2022-02-02 00:00:00)

## 2023-05-25 ENCOUNTER — Other Ambulatory Visit: Payer: Self-pay

## 2023-05-25 ENCOUNTER — Encounter: Payer: Self-pay | Admitting: Oncology

## 2023-05-25 LAB — MULTIPLE MYELOMA PANEL, SERUM
Albumin SerPl Elph-Mcnc: 3.4 g/dL (ref 2.9–4.4)
Albumin/Glob SerPl: 0.7 (ref 0.7–1.7)
Alpha 1: 0.2 g/dL (ref 0.0–0.4)
Alpha2 Glob SerPl Elph-Mcnc: 0.7 g/dL (ref 0.4–1.0)
B-Globulin SerPl Elph-Mcnc: 1.2 g/dL (ref 0.7–1.3)
Gamma Glob SerPl Elph-Mcnc: 3.1 g/dL — ABNORMAL HIGH (ref 0.4–1.8)
Globulin, Total: 5.3 g/dL — ABNORMAL HIGH (ref 2.2–3.9)
IgA: 4341 mg/dL — ABNORMAL HIGH (ref 61–437)
IgG (Immunoglobin G), Serum: 265 mg/dL — ABNORMAL LOW (ref 603–1613)
IgM (Immunoglobulin M), Srm: 6 mg/dL — ABNORMAL LOW (ref 20–172)
M Protein SerPl Elph-Mcnc: 2.7 g/dL — ABNORMAL HIGH
Total Protein ELP: 8.7 g/dL — ABNORMAL HIGH (ref 6.0–8.5)

## 2023-05-26 ENCOUNTER — Other Ambulatory Visit: Payer: Self-pay

## 2023-05-26 DIAGNOSIS — C9000 Multiple myeloma not having achieved remission: Secondary | ICD-10-CM

## 2023-05-26 MED ORDER — LENALIDOMIDE 10 MG PO CAPS: 10.0000 mg | ORAL_CAPSULE | Freq: Every day | ORAL | 0 refills | Status: DC

## 2023-05-27 ENCOUNTER — Ambulatory Visit: Payer: Medicare HMO | Admitting: Oncology

## 2023-05-27 ENCOUNTER — Encounter: Payer: Self-pay | Admitting: Oncology

## 2023-05-27 ENCOUNTER — Inpatient Hospital Stay: Payer: Medicare HMO

## 2023-05-27 ENCOUNTER — Other Ambulatory Visit: Payer: Self-pay | Admitting: Oncology

## 2023-05-27 VITALS — BP 123/68 | HR 91 | Temp 96.3°F | Resp 18 | Ht 68.0 in | Wt 188.5 lb

## 2023-05-27 DIAGNOSIS — C9000 Multiple myeloma not having achieved remission: Secondary | ICD-10-CM

## 2023-05-27 DIAGNOSIS — Z5112 Encounter for antineoplastic immunotherapy: Secondary | ICD-10-CM | POA: Diagnosis not present

## 2023-05-27 LAB — CBC WITH DIFFERENTIAL (CANCER CENTER ONLY)
Abs Immature Granulocytes: 0.02 10*3/uL (ref 0.00–0.07)
Basophils Absolute: 0 10*3/uL (ref 0.0–0.1)
Basophils Relative: 0 %
Eosinophils Absolute: 0.1 10*3/uL (ref 0.0–0.5)
Eosinophils Relative: 4 %
HCT: 28 % — ABNORMAL LOW (ref 39.0–52.0)
Hemoglobin: 9.1 g/dL — ABNORMAL LOW (ref 13.0–17.0)
Immature Granulocytes: 1 %
Lymphocytes Relative: 13 %
Lymphs Abs: 0.4 10*3/uL — ABNORMAL LOW (ref 0.7–4.0)
MCH: 32.7 pg (ref 26.0–34.0)
MCHC: 32.5 g/dL (ref 30.0–36.0)
MCV: 100.7 fL — ABNORMAL HIGH (ref 80.0–100.0)
Monocytes Absolute: 0.5 10*3/uL (ref 0.1–1.0)
Monocytes Relative: 15 %
Neutro Abs: 2.2 10*3/uL (ref 1.7–7.7)
Neutrophils Relative %: 67 %
Platelet Count: 50 10*3/uL — ABNORMAL LOW (ref 150–400)
RBC: 2.78 MIL/uL — ABNORMAL LOW (ref 4.22–5.81)
RDW: 17.2 % — ABNORMAL HIGH (ref 11.5–15.5)
WBC Count: 3.2 10*3/uL — ABNORMAL LOW (ref 4.0–10.5)
nRBC: 0.6 % — ABNORMAL HIGH (ref 0.0–0.2)

## 2023-05-27 LAB — CMP (CANCER CENTER ONLY)
ALT: 54 U/L — ABNORMAL HIGH (ref 0–44)
AST: 19 U/L (ref 15–41)
Albumin: 3 g/dL — ABNORMAL LOW (ref 3.5–5.0)
Alkaline Phosphatase: 131 U/L — ABNORMAL HIGH (ref 38–126)
Anion gap: 5 (ref 5–15)
BUN: 37 mg/dL — ABNORMAL HIGH (ref 8–23)
CO2: 23 mmol/L (ref 22–32)
Calcium: 6.8 mg/dL — ABNORMAL LOW (ref 8.9–10.3)
Chloride: 107 mmol/L (ref 98–111)
Creatinine: 1.25 mg/dL — ABNORMAL HIGH (ref 0.61–1.24)
GFR, Estimated: >60 mL/min (ref >60)
Glucose, Bld: 176 mg/dL — ABNORMAL HIGH (ref 70–99)
Potassium: 4.4 mmol/L (ref 3.5–5.1)
Sodium: 135 mmol/L (ref 135–145)
Total Bilirubin: 0.5 mg/dL (ref 0.3–1.2)
Total Protein: 7.5 g/dL (ref 6.5–8.1)

## 2023-05-27 MED ORDER — DEXAMETHASONE 4 MG PO TABS: 20.0000 mg | ORAL_TABLET | Freq: Once | ORAL | Status: DC

## 2023-05-27 MED ORDER — DARATUMUMAB-HYALURONIDASE-FIHJ 1800-30000 MG-UT/15ML ~~LOC~~ SOLN: 1800.0000 mg | Freq: Once | SUBCUTANEOUS | Status: DC

## 2023-05-27 MED ORDER — DIPHENHYDRAMINE HCL 25 MG PO CAPS: 50.0000 mg | ORAL_CAPSULE | Freq: Once | ORAL | Status: DC

## 2023-05-27 MED ORDER — ACETAMINOPHEN 325 MG PO TABS: 650.0000 mg | ORAL_TABLET | Freq: Once | ORAL | Status: DC

## 2023-05-28 ENCOUNTER — Emergency Department: Payer: Medicare HMO

## 2023-05-28 ENCOUNTER — Other Ambulatory Visit: Payer: Self-pay

## 2023-05-28 ENCOUNTER — Emergency Department
Admission: EM | Admit: 2023-05-28 | Discharge: 2023-05-28 | Disposition: A | Discharge status: Home / Self Care | Payer: Medicare HMO | Attending: Emergency Medicine | Admitting: Emergency Medicine

## 2023-05-28 DIAGNOSIS — E1122 Type 2 diabetes mellitus with diabetic chronic kidney disease: Secondary | ICD-10-CM | POA: Diagnosis not present

## 2023-05-28 DIAGNOSIS — R600 Localized edema: Secondary | ICD-10-CM | POA: Insufficient documentation

## 2023-05-28 DIAGNOSIS — N189 Chronic kidney disease, unspecified: Secondary | ICD-10-CM | POA: Insufficient documentation

## 2023-05-28 DIAGNOSIS — I129 Hypertensive chronic kidney disease with stage 1 through stage 4 chronic kidney disease, or unspecified chronic kidney disease: Secondary | ICD-10-CM | POA: Insufficient documentation

## 2023-05-28 DIAGNOSIS — I4891 Unspecified atrial fibrillation: Secondary | ICD-10-CM | POA: Diagnosis not present

## 2023-05-28 LAB — BASIC METABOLIC PANEL
Anion gap: 9 (ref 5–15)
BUN: 34 mg/dL — ABNORMAL HIGH (ref 8–23)
CO2: 22 mmol/L (ref 22–32)
Calcium: 6.9 mg/dL — ABNORMAL LOW (ref 8.9–10.3)
Chloride: 105 mmol/L (ref 98–111)
Creatinine, Ser: 1.3 mg/dL — ABNORMAL HIGH (ref 0.61–1.24)
GFR, Estimated: >60 mL/min (ref >60)
Glucose, Bld: 104 mg/dL — ABNORMAL HIGH (ref 70–99)
Potassium: 4 mmol/L (ref 3.5–5.1)
Sodium: 136 mmol/L (ref 135–145)

## 2023-05-28 LAB — CBC
HCT: 28 % — ABNORMAL LOW (ref 39.0–52.0)
Hemoglobin: 9.2 g/dL — ABNORMAL LOW (ref 13.0–17.0)
MCH: 32.9 pg (ref 26.0–34.0)
MCHC: 32.9 g/dL (ref 30.0–36.0)
MCV: 100 fL (ref 80.0–100.0)
Platelets: 56 10*3/uL — ABNORMAL LOW (ref 150–400)
RBC: 2.8 MIL/uL — ABNORMAL LOW (ref 4.22–5.81)
RDW: 17.2 % — ABNORMAL HIGH (ref 11.5–15.5)
WBC: 3.4 10*3/uL — ABNORMAL LOW (ref 4.0–10.5)
nRBC: 0 % (ref 0.0–0.2)

## 2023-05-28 LAB — TROPONIN I (HIGH SENSITIVITY): Troponin I (High Sensitivity): 10 ng/L (ref <18)

## 2023-05-28 MED ORDER — DILTIAZEM HCL 60 MG PO TABS
30.0000 mg | ORAL_TABLET | Freq: Once | ORAL | Status: AC
  Administered 2023-05-28: 30 mg via ORAL
  Filled 2023-05-28: qty 1

## 2023-05-28 MED ORDER — DILTIAZEM HCL ER COATED BEADS 120 MG PO CP24: 120.0000 mg | ORAL_CAPSULE | Freq: Every day | ORAL | 0 refills | Status: DC

## 2023-05-28 NOTE — Discharge Instructions (Signed)
You were seen in the emergency department today for your leg swelling.  Your ultrasounds were fortunately negative.  We did notice on your EKG that you did have an abnormal rhythm called atrial fibrillation.  I sent a prescription for a medicine called diltiazem to your pharmacy that can help control your heart rate.  I placed a referral to cardiology for further evaluation of this.  Please keep your scheduled follow-up with your oncologist.  Return to the ER for new or worsening symptoms.

## 2023-05-28 NOTE — ED Provider Notes (Signed)
[facility] Provider Note    Event Date/Time   First MD Initiated Contact with Patient 05/28/23 1736     (approximate)   History   Leg Swelling and Shortness of Breath   HPI  Victor Phillips is a 67 year old male with history of T2DM, HTN, multiple myeloma, CKD presenting to the emergency department for evaluation of leg swelling.  Patient currently undergoing treatment for his multiple myeloma.  Last night he noted swelling in his left leg and this morning noticed swelling in his right leg.  He thinks this could be related to a recent medication started for his cancer.  However, he spoke with his oncology team who was concerned about a possible blood clot in his legs and directed him to the ER.  He reports that since prior to starting treatment for his multiple myeloma, estimates around May he had some shortness of breath with exertion.  He does report this is ongoing, but is adamant that he has not had any change in the degree of his shortness of breath or new breathing problems.  He denies any chest pain.     Physical Exam   Triage Vital Signs: ED Triage Vitals  Encounter Vitals Group     BP 05/28/23 1700 (!) 162/93     Systolic BP Percentile --      Diastolic BP Percentile --      Pulse Rate 05/28/23 1657 100     Resp 05/28/23 1700 20     Temp 05/28/23 1700 (!) 97.5 F (36.4 C)     Temp Source 05/28/23 1700 Oral     SpO2 05/28/23 1700 100 %     Weight --      Height --      Head Circumference --      Peak Flow --      Pain Score 05/28/23 1700 3     Pain Loc --      Pain Education --      Exclude from Growth Chart --     Most recent vital signs: Vitals:   05/28/23 1811 05/28/23 1830  BP: (!) 147/94 110/80  Pulse: (!) 107 (!) 59  Resp: 18 (!) 25  Temp: 97.6 F (36.4 C)   SpO2: 97% 98%     General: Awake, interactive  CV:  Regular rate, good peripheral perfusion.  Resp:  Lungs clear, unlabored respirations.  Abd:  Soft,  nondistended.  Neuro:  Symmetric facial movement, fluid speech MSK:  2+ DP pulses bilaterally, bilateral pitting edema of the lower extremities, worse on the left, sensation intact throughout the extremity   ED Results / Procedures / Treatments   Labs (all labs ordered are listed, but only abnormal results are displayed) Labs Reviewed  BASIC METABOLIC PANEL - Abnormal; Notable for the following components:      Result Value   Glucose, Bld 104 (*)    BUN 34 (*)    Creatinine, Ser 1.30 (*)    Calcium 6.9 (*)    All other components within normal limits  CBC - Abnormal; Notable for the following components:   WBC 3.4 (*)    RBC 2.80 (*)    Hemoglobin 9.2 (*)    HCT 28.0 (*)    RDW 17.2 (*)    Platelets 56 (*)    All other components within normal limits  TROPONIN I (HIGH SENSITIVITY)     EKG EKG independently reviewed interpreted by myself (ER attending) demonstrates:  EKG  demonstrates A-fib at a rate of 107, QRS 88, QTc 467, nonspecific ST abnormality  RADIOLOGY Imaging independently reviewed and interpreted by myself demonstrates:  Lower extremity ultrasound without evidence of DVT Chest x-Atonya Templer without focal consolidation  PROCEDURES:  Critical Care performed: No  Procedures   MEDICATIONS ORDERED IN ED: Medications  diltiazem (CARDIZEM) tablet 30 mg (30 mg Oral Given 05/28/23 1822)     IMPRESSION / MDM / ASSESSMENT AND PLAN / ED COURSE  I reviewed the triage vital signs and the nursing notes.  Differential diagnosis includes, but is not limited to, DVT, medication adverse effect, renal dysfunction, arrhythmia, lower suspicion CHF in the absence of new cardiorespiratory symptoms  Patient's presentation is most consistent with acute presentation with potential threat to life or bodily function.  67 year old male presenting with new lower extremity swelling.  DVT ultrasound ordered from triage without evidence of DVT.  Labs from triage with stable anemia,  thrombocytopenia, renal dysfunction.  Normal potassium at 4, normal troponin.  EKG is notable for A-fib with mildly elevated heart rates.  Patient without known history of A-fib and I do not see any documentation of this in his chart.  He has no cardiorespiratory symptoms.  I did discuss potentially scanning his chest for PE given his new arrhythmia, but patient reports that he has had many CT scans recently and was cautioned against CT scans whenever possible by his oncologist.  I do see that he has had multiple CT scans recently.  In the absence of any new cardiopulmonary symptoms as well as a negative DVT ultrasound, do think it is reasonable to hold off on CTA currently.  Patient was given an oral dose of diltiazem here with heart rates in the upper 90s on reevaluation.  Patient is eager to be discharged home.  Given improvement in his heart rate and his otherwise reassuring workup, do think this is reasonable.  Will plan to DC with a prescription for diltiazem.  Did discuss that in the setting of his thrombocytopenia I would not recommend starting anticoagulation from the ER, but this can be discussed further with his outpatient doctors.  Will place referral to cardiology.  Strict return precautions provided.  Patient discharged in stable condition.      FINAL CLINICAL IMPRESSION(S) / ED DIAGNOSES   Final diagnoses:  Bilateral lower extremity edema  Atrial fibrillation, unspecified type (HCC)     Rx / DC Orders   ED Discharge Orders          Ordered    Ambulatory referral to Cardiology       Comments: If you have not heard from the Cardiology office within the next 72 hours please call [phone].   05/28/23 1941    diltiazem (CARDIZEM CD) 120 MG 24 hr capsule  Daily        05/28/23 1944             Note:  This document was prepared using Dragon voice recognition software and may include unintentional dictation errors.   Trinna Post, MD 05/28/23 [phone]

## 2023-05-28 NOTE — ED Triage Notes (Signed)
Pt to Ed via POV from home. Pt reports leg swelling that started last night in the left leg and reports right leg started swelling this morning. Pt reports SOB with exertion. Pt has chemo card and on chemo pill and shot for MLL. Pt's platelets were low on Friday and could not receive chemo shot. Pt denies hx of afib - EKG showing afib HR 107.

## 2023-05-28 NOTE — ED Notes (Signed)
Pt coming after [place] is complete.

## 2023-05-30 ENCOUNTER — Telehealth: Payer: Self-pay | Admitting: *Deleted

## 2023-05-30 ENCOUNTER — Other Ambulatory Visit: Payer: Self-pay

## 2023-05-30 DIAGNOSIS — M7989 Other specified soft tissue disorders: Secondary | ICD-10-CM

## 2023-05-30 NOTE — Telephone Encounter (Signed)
Spoke to someone in cardiology office who said that appt in November was the soonest, but that he will be put in a cancellation list.

## 2023-05-30 NOTE — Telephone Encounter (Signed)
Called pt and informed him of plan. Called cardiology to see if appt can be moved up sooner but no answer. Will try again later.

## 2023-05-30 NOTE — Telephone Encounter (Signed)
Patient called reporting that he has left leg swelling  for 4 days, he went to ER Saturday and does not have a clot. He states ot goes down over night but comes back when up. He also reports that he has shortness of breath when he walks across the room that has been going on for the past week. And now for the past 2 nights he is having urinary incontinence at night when he goes to sleep ; he denies any foul odor, discoloration of urine, frequency or burning with urination . Please advise.  IMPRESSION / MDM / ASSESSMENT AND PLAN / ED COURSE  I reviewed the triage vital signs and the nursing notes.   Differential diagnosis includes, but is not limited to, DVT, medication adverse effect, renal dysfunction, arrhythmia, lower suspicion CHF in the absence of new cardiorespiratory symptoms   Patient's presentation is most consistent with acute presentation with potential threat to life or bodily function.   67 year old male presenting with new lower extremity swelling.  DVT ultrasound ordered from triage without evidence of DVT.  Labs from triage with stable anemia, thrombocytopenia, renal dysfunction.  Normal potassium at 4, normal troponin.  EKG is notable for A-fib with mildly elevated heart rates.  Patient without known history of A-fib and I do not see any documentation of this in his chart.  He has no cardiorespiratory symptoms.  I did discuss potentially scanning his chest for PE given his new arrhythmia, but patient reports that he has had many CT scans recently and was cautioned against CT scans whenever possible by his oncologist.  I do see that he has had multiple CT scans recently.  In the absence of any new cardiopulmonary symptoms as well as a negative DVT ultrasound, do think it is reasonable to hold off on CTA currently.  Patient was given an oral dose of diltiazem here with heart rates in the upper 90s on reevaluation.  Patient is eager to be discharged home.  Given improvement in his heart rate  and his otherwise reassuring workup, do think this is reasonable.  Will plan to DC with a prescription for diltiazem.  Did discuss that in the setting of his thrombocytopenia I would not recommend starting anticoagulation from the ER, but this can be discussed further with his outpatient doctors.  Will place referral to cardiology.  Strict return precautions provided.  Patient discharged in stable condition.       FINAL CLINICAL IMPRESSION(S) / ED DIAGNOSES    Final diagnoses:  Bilateral lower extremity edema  Atrial fibrillation, unspecified type (HCC)        Rx / DC Orders    ED Discharge Orders             Ordered      Ambulatory referral to Cardiology       Comments: If you have not heard from the Cardiology office within the next 72 hours please call [phone].   05/28/23 1941      diltiazem (CARDIZEM CD) 120 MG 24 hr capsule  Daily        05/28/23 1944

## 2023-06-03 ENCOUNTER — Inpatient Hospital Stay: Payer: Medicare HMO

## 2023-06-03 ENCOUNTER — Inpatient Hospital Stay (HOSPITAL_BASED_OUTPATIENT_CLINIC_OR_DEPARTMENT_OTHER): Payer: Medicare HMO | Admitting: Oncology

## 2023-06-03 ENCOUNTER — Other Ambulatory Visit: Payer: Medicare HMO

## 2023-06-03 ENCOUNTER — Encounter: Payer: Self-pay | Admitting: Oncology

## 2023-06-03 ENCOUNTER — Ambulatory Visit: Payer: Medicare HMO

## 2023-06-03 VITALS — BP 126/87 | HR 61 | Temp 96.2°F | Resp 18 | Wt 190.0 lb

## 2023-06-03 DIAGNOSIS — Z5111 Encounter for antineoplastic chemotherapy: Secondary | ICD-10-CM

## 2023-06-03 DIAGNOSIS — N1832 Chronic kidney disease, stage 3b: Secondary | ICD-10-CM

## 2023-06-03 DIAGNOSIS — R600 Localized edema: Secondary | ICD-10-CM

## 2023-06-03 DIAGNOSIS — I4891 Unspecified atrial fibrillation: Secondary | ICD-10-CM | POA: Diagnosis not present

## 2023-06-03 DIAGNOSIS — C9000 Multiple myeloma not having achieved remission: Secondary | ICD-10-CM | POA: Diagnosis not present

## 2023-06-03 DIAGNOSIS — D696 Thrombocytopenia, unspecified: Secondary | ICD-10-CM

## 2023-06-03 DIAGNOSIS — Z5112 Encounter for antineoplastic immunotherapy: Secondary | ICD-10-CM | POA: Diagnosis not present

## 2023-06-03 LAB — CBC WITH DIFFERENTIAL (CANCER CENTER ONLY)
Abs Immature Granulocytes: 0.01 10*3/uL (ref 0.00–0.07)
Basophils Absolute: 0 10*3/uL (ref 0.0–0.1)
Basophils Relative: 0 %
Eosinophils Absolute: 0.1 10*3/uL (ref 0.0–0.5)
Eosinophils Relative: 4 %
HCT: 28 % — ABNORMAL LOW (ref 39.0–52.0)
Hemoglobin: 9.2 g/dL — ABNORMAL LOW (ref 13.0–17.0)
Immature Granulocytes: 0 %
Lymphocytes Relative: 15 %
Lymphs Abs: 0.4 10*3/uL — ABNORMAL LOW (ref 0.7–4.0)
MCH: 33.2 pg (ref 26.0–34.0)
MCHC: 32.9 g/dL (ref 30.0–36.0)
MCV: 101.1 fL — ABNORMAL HIGH (ref 80.0–100.0)
Monocytes Absolute: 0.3 10*3/uL (ref 0.1–1.0)
Monocytes Relative: 11 %
Neutro Abs: 2 10*3/uL (ref 1.7–7.7)
Neutrophils Relative %: 70 %
Platelet Count: 140 10*3/uL — ABNORMAL LOW (ref 150–400)
RBC: 2.77 MIL/uL — ABNORMAL LOW (ref 4.22–5.81)
RDW: 17.2 % — ABNORMAL HIGH (ref 11.5–15.5)
WBC Count: 2.8 10*3/uL — ABNORMAL LOW (ref 4.0–10.5)
nRBC: 0 % (ref 0.0–0.2)

## 2023-06-03 LAB — CMP (CANCER CENTER ONLY)
ALT: 40 U/L (ref 0–44)
AST: 20 U/L (ref 15–41)
Albumin: 3.2 g/dL — ABNORMAL LOW (ref 3.5–5.0)
Alkaline Phosphatase: 109 U/L (ref 38–126)
Anion gap: 6 (ref 5–15)
BUN: 33 mg/dL — ABNORMAL HIGH (ref 8–23)
CO2: 22 mmol/L (ref 22–32)
Calcium: 7.4 mg/dL — ABNORMAL LOW (ref 8.9–10.3)
Chloride: 107 mmol/L (ref 98–111)
Creatinine: 1.09 mg/dL (ref 0.61–1.24)
GFR, Estimated: >60 mL/min (ref >60)
Glucose, Bld: 197 mg/dL — ABNORMAL HIGH (ref 70–99)
Potassium: 4.7 mmol/L (ref 3.5–5.1)
Sodium: 135 mmol/L (ref 135–145)
Total Bilirubin: 0.7 mg/dL (ref 0.3–1.2)
Total Protein: 7.5 g/dL (ref 6.5–8.1)

## 2023-06-03 MED ORDER — DIPHENHYDRAMINE HCL 25 MG PO CAPS
50.0000 mg | ORAL_CAPSULE | Freq: Once | ORAL | Status: AC
  Administered 2023-06-03: 50 mg via ORAL
  Filled 2023-06-03: qty 2

## 2023-06-03 MED ORDER — FUROSEMIDE 20 MG PO TABS: 20.0000 mg | ORAL_TABLET | Freq: Every day | ORAL | 0 refills | Status: DC | PRN

## 2023-06-03 MED ORDER — BORTEZOMIB CHEMO SQ INJECTION 3.5 MG (2.5MG/ML)
1.3000 mg/m2 | Freq: Once | INTRAMUSCULAR | Status: AC
  Administered 2023-06-03: 2.5 mg via SUBCUTANEOUS
  Filled 2023-06-03: qty 1

## 2023-06-03 MED ORDER — DARATUMUMAB-HYALURONIDASE-FIHJ 1800-30000 MG-UT/15ML ~~LOC~~ SOLN
1800.0000 mg | Freq: Once | SUBCUTANEOUS | Status: AC
  Administered 2023-06-03: 1800 mg via SUBCUTANEOUS
  Filled 2023-06-03: qty 15

## 2023-06-03 MED ORDER — DEXAMETHASONE 4 MG PO TABS
20.0000 mg | ORAL_TABLET | Freq: Once | ORAL | Status: AC
  Administered 2023-06-03: 20 mg via ORAL
  Filled 2023-06-03: qty 5

## 2023-06-03 MED ORDER — APIXABAN 2.5 MG PO TABS: 2.5000 mg | ORAL_TABLET | Freq: Two times a day (BID) | ORAL | 1 refills | Status: DC

## 2023-06-03 MED ORDER — ACETAMINOPHEN 325 MG PO TABS
650.0000 mg | ORAL_TABLET | Freq: Once | ORAL | Status: AC
  Administered 2023-06-03: 650 mg via ORAL
  Filled 2023-06-03: qty 2

## 2023-06-03 NOTE — Assessment & Plan Note (Signed)
Chemotherapy plan as listed above.  Discussed about antiemetics instruction.

## 2023-06-03 NOTE — Assessment & Plan Note (Signed)
No DVT on [place] Check Echo Recommend Lasix 20mg  daily.

## 2023-06-03 NOTE — Assessment & Plan Note (Signed)
Cr is improving  Encourage oral hydration and avoid nephrotoxins.

## 2023-06-03 NOTE — Assessment & Plan Note (Signed)
Chemotherapy induced. Close monitor

## 2023-06-03 NOTE — Progress Notes (Signed)
Hematology/Oncology Progress note [phone] [phone]         Patient Care Team: Danella Penton, MD as PCP - General (Internal Medicine) [name], MD as Consulting Physician (Oncology)   REFERRING PROVIDER: Danella Penton, MD  CHIEF COMPLAINTS/REASON FOR VISIT:  Multiple myeloma   ASSESSMENT & PLAN:   Cancer Staging  Multiple myeloma [facility]) Staging form: Plasma Cell Myeloma and Plasma Cell Disorders, AJCC 8th Edition - Clinical stage from 04/14/2023: RISS Stage II (Beta-2-microglobulin (mg/L): 8.4, Albumin (g/dL): 2.6, ISS: Stage III, High-risk cytogenetics: Absent, LDH: Normal) - Signed by [name], MD on 04/22/2023   Multiple myeloma (HCC) Bone marrow biopsy was reviewed. 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 Proceed with cycle 3 Daratumumab and Velcade with revlimid 10mg  2 weeks on 1 week off He gets Dexamethasone 20mg  weekly prior to Daratumumab treatments.   Continue Acyclovir, At high risk of thrombosis, recommend him to stop Aspirin, switch to Eliquis 2.5mg  BID. Rx sent Proceed with Xegeva today. Rationale and side effects were reviewed with patient and he agrees.  Recommend calcium and vitamin D supplementation.   CKD (chronic kidney disease) stage 3, GFR 30-59 ml/min (HCC) Cr is improving  Encourage oral hydration and avoid nephrotoxins.    Encounter for antineoplastic chemotherapy Chemotherapy plan as listed above.  Discussed about antiemetics instruction.  Atrial fibrillation (HCC) Check Echo -scheduled on Echo 06/14/23  Refer to cardiology for evaluation.  Start on Eliquis 2.5mg  BID.   Thrombocytopenia (HCC) Chemotherapy induced. Close monitor  Bilateral lower extremity edema No DVT on [place] Check Echo Recommend Lasix 20mg  daily.   No orders of the defined types were placed in this  encounter.  Follow-up  1 weeks  We spent sufficient time to discuss many aspect of care, questions were answered to patient's satisfaction.    [name], MD, PhD [facility] Hematology Oncology 06/03/2023     HISTORY OF PRESENTING ILLNESS:  Victor Phillips is a  67 y.o.  male with PMH listed below who was referred to me for anemia  02/11/2023 - 02/14/2023 recent hospitalization due to pneumonia, respiratory failure.  He was found to have a hemoglobin decreased at 6.8, status post PRBC transfusion during admission.  EGD showed gastritis.  Colonoscopy was not remarkable. 02/11/2023 TIBC 221 ferritin 107, iron saturation 16. Patient is currently taking fusion plus Vitamin B12 level in the 300s. His echo showed grade 2 diastolic CHF.  He denies recent chest pain on exertion, shortness of breath on minimal exertion, pre-syncopal episodes, or palpitations He had not noticed any recent bleeding such as epistaxis, hematuria or hematochezia.  He denies over the counter NSAID ingestion.  Oncology History  Multiple myeloma (HCC)  04/14/2023 Initial Diagnosis   Multiple myeloma   03/13/2020 multiple myeloma panel showed M protein 4.3, IgA lambda Free lamda Level 142,-light chain ratio 0.07 04/05/2023 bone marrow biopsy showed hypercellular bone marrow with plasma cell neoplasm.The bone marrow is hypercellular for age with prominent increase in plasma cells representing 65% of all cells in the aspirate associated with interstitial infiltrates and numerous variably sized aggregates in  the clot and biopsy sections.  The plasma cells display lambda light chain restriction consistent with plasma cell neoplasm.  Normal cytogenetics.  FISH studies pending.    04/14/2023 Cancer Staging   Staging form: Plasma Cell Myeloma and Plasma  Cell Disorders, AJCC 8th Edition - Clinical stage from 04/14/2023: RISS Stage II (Beta-2-microglobulin (mg/L): 8.4, Albumin (g/dL): 2.6, ISS: Stage III, High-risk cytogenetics:  Absent, LDH: Normal) - Signed by [name], MD on 04/22/2023 Stage prefix: Initial diagnosis Beta 2 microglobulin range (mg/L): Greater than or equal to 5.5 Albumin range (g/dL): Less than 3.5 Cytogenetics: 1q addition, Other mutation   04/14/2023 Imaging   Skeletal survey 1. No lytic lesions or other intrinsic bony abnormality. 2. Borderline cardiomegaly with an interval decrease in size. 3. Diffuse atheromatous arterial calcifications including bilateral carotid artery calcifications, right greater than left   04/22/2023 -  Chemotherapy   Patient is on Treatment Plan : MYELOMA NEWLY DIAGNOSED TRANSPLANT CANDIDATE DaraVRd (Daratumumab SQ) q21d x 6 Cycles (Induction/Consolidation)      He takes Aspirin and Acyclovir  Recent visit to ER, was found to have A Fib. Lowe extremity edema bilaterally, and SOB with exertion.  Denies chest pain nausea, vomiting, diarrhea.   MEDICAL HISTORY:  Past Medical History:  Diagnosis Date   Diabetes mellitus without complication (HCC)    Hypertension     SURGICAL HISTORY: Past Surgical History:  Procedure Laterality Date   COLONOSCOPY N/A 02/13/2023   Procedure: COLONOSCOPY;  Surgeon: Toledo, Boykin Nearing, MD;  Location: [facility] ENDOSCOPY;  Service: Gastroenterology;  Laterality: N/A;   COLONOSCOPY N/A 02/14/2023   Procedure: COLONOSCOPY;  Surgeon: Toledo, Boykin Nearing, MD;  Location: [facility] ENDOSCOPY;  Service: Gastroenterology;  Laterality: N/A;   ESOPHAGOGASTRODUODENOSCOPY N/A 02/13/2023   Procedure: ESOPHAGOGASTRODUODENOSCOPY (EGD);  Surgeon: Toledo, Boykin Nearing, MD;  Location: [facility] ENDOSCOPY;  Service: Gastroenterology;  Laterality: N/A;    SOCIAL HISTORY: Social History   Socioeconomic History   Marital status: Married    Spouse name: Not on file   Number of children: Not on file   Years of education: Not on file   Highest education level: Not on file  Occupational History   Not on file  Tobacco Use   Smoking status: Never   Smokeless tobacco: Never   Vaping Use   Vaping status: Never Used  Substance and Sexual Activity   Alcohol use: Not Currently    Comment: quit approx 2001   Drug use: No   Sexual activity: Not on file  Other Topics Concern   Not on file  Social History Narrative   Not on file   Social Determinants of Health   Financial Resource Strain: Low Risk  (05/05/2023)   Received from [facility]   Overall Financial Resource Strain (CARDIA)    Difficulty of Paying Living Expenses: Not hard at all  Food Insecurity: No Food Insecurity (05/05/2023)   Received from [facility]   Hunger Vital Sign    Worried About Running Out of Food in the Last Year: Never true    Ran Out of Food in the Last Year: Never true  Transportation Needs: No Transportation Needs (05/05/2023)   Received from [facility] - Transportation    In the past 12 months, has lack of transportation kept you from medical appointments or from getting medications?: No    Lack of Transportation (Non-Medical): No  Physical Activity: Sufficiently Active (09/24/2020)   Received from [facility], [facility]   Exercise Vital Sign    Days of Exercise per Week: 6 days    Minutes of Exercise per Session: 30 min  Stress: No Stress Concern Present (09/24/2020)   Received from [facility], [facility]   [employer] of Occupational Health - Occupational Stress Questionnaire    Feeling of Stress : Not at all  Social Connections: Socially Integrated (09/24/2020)   Received from [facility], [facility]   Social Connection and Isolation Panel [NHANES]    Frequency of Communication with Friends and Family: More than three times a week    Frequency of Social Gatherings with Friends and Family: Once a week    Attends Religious Services: More than 4 times per year    Active Member of [employer] or  Organizations: Yes    Attends Engineer, structural: More than 4 times per year    Marital Status: Married  Catering manager Violence: Not At Risk (03/14/2023)   Humiliation, Afraid, Rape, and Kick questionnaire    Fear of Current or Ex-Partner: No    Emotionally Abused: No    Physically Abused: No    Sexually Abused: No    FAMILY HISTORY: Family History  Problem Relation Age of Onset   Diabetes Mother    Heart attack Father     ALLERGIES:  has No Known Allergies.  MEDICATIONS:  Current Outpatient Medications  Medication Sig Dispense Refill   acetaminophen (TYLENOL) 325 MG tablet Take 2 tablets (650 mg total) by mouth every 6 (six) hours as needed for mild pain (or Fever >/= 101). 90 tablet 1   acyclovir (ZOVIRAX) 400 MG tablet Take 1 tablet (400 mg total) by mouth 2 (two) times daily. 60 tablet 5   apixaban (ELIQUIS) 2.5 MG TABS tablet Take 1 tablet (2.5 mg total) by mouth 2 (two) times daily. 60 tablet 1   atorvastatin (LIPITOR) 10 MG tablet Take 1 tablet by mouth daily.     calcium carbonate (OS-CAL) 600 MG TABS tablet Take 2 tablets (1,200 mg total) by mouth daily.     cholecalciferol (VITAMIN D3) 25 MCG (1000 UNIT) tablet Take 1 tablet (1,000 Units total) by mouth daily.     diltiazem (CARDIZEM CD) 120 MG 24 hr capsule Take 1 capsule (120 mg total) by mouth daily. 30 capsule 0   furosemide (LASIX) 20 MG tablet Take 1 tablet (20 mg total) by mouth daily as needed for edema or fluid. 30 tablet 0   glipiZIDE (GLUCOTROL XL) 2.5 MG 24 hr tablet Take by mouth.     lenalidomide (REVLIMID) 10 MG capsule Take 1 capsule (10 mg total) by mouth daily. Take for 14 days, then hold for 7 days off. Repeat every 21 days. 14 capsule 0   Multiple Vitamin (MULTIVITAMIN WITH MINERALS) TABS tablet Take 1 tablet by mouth daily. 90 tablet 1   olmesartan-hydrochlorothiazide (BENICAR HCT) 20-12.5 MG tablet Take 1 tablet by mouth daily.     ondansetron (ZOFRAN) 8 MG tablet Take 1 tablet (8 mg  total) by mouth every 8 (eight) hours as needed for nausea or vomiting. 30 tablet 1   prochlorperazine (COMPAZINE) 10 MG tablet Take 1 tablet (10 mg total) by mouth every 6 (six) hours as needed for nausea or vomiting. 30 tablet 1   senna (SENOKOT) 8.6 MG TABS tablet Take 2 tablets (17.2 mg total) by mouth daily. 120 tablet 0   temazepam (RESTORIL) 15 MG capsule Take by mouth.     No current facility-administered medications for this visit.    Review of Systems  Constitutional:  Positive for fatigue. Negative for appetite change, chills, fever and unexpected weight change.  HENT:   Negative for hearing  loss and voice change.   Eyes:  Negative for eye problems and icterus.  Respiratory:  Negative for chest tightness, cough and shortness of breath.   Cardiovascular:  Positive for leg swelling. Negative for chest pain.  Gastrointestinal:  Negative for abdominal distention and abdominal pain.  Endocrine: Negative for hot flashes.  Genitourinary:  Negative for difficulty urinating, dysuria and frequency.   Musculoskeletal:  Negative for arthralgias.  Skin:  Negative for itching and rash.  Neurological:  Negative for light-headedness and numbness.  Hematological:  Negative for adenopathy. Does not bruise/bleed easily.  Psychiatric/Behavioral:  Negative for confusion.     PHYSICAL EXAMINATION: Vitals:   06/03/23 0835 06/03/23 0922  BP: (!) 148/92 126/87  Pulse: 61   Resp: 18   Temp: (!) 96.2 F (35.7 C)   SpO2: 97%    Filed Weights   06/03/23 0835  Weight: 190 lb (86.2 kg)    Physical Exam Constitutional:      General: He is not in acute distress. HENT:     Head: Normocephalic and atraumatic.  Eyes:     General: No scleral icterus. Cardiovascular:     Rate and Rhythm: Normal rate and regular rhythm.     Heart sounds: Normal heart sounds.  Pulmonary:     Effort: Pulmonary effort is normal. No respiratory distress.     Comments: Crackles at the base of lung  Abdominal:      General: Bowel sounds are normal. There is no distension.     Palpations: Abdomen is soft.  Musculoskeletal:        General: No deformity. Normal range of motion.     Cervical back: Normal range of motion and neck supple.     Right lower leg: Edema present.     Left lower leg: Edema present.  Skin:    General: Skin is warm and dry.     Findings: No erythema or rash.  Neurological:     Mental Status: He is alert and oriented to person, place, and time. Mental status is at baseline.     Cranial Nerves: No cranial nerve deficit.     Coordination: Coordination normal.  Psychiatric:        Mood and Affect: Mood normal.      LABORATORY DATA:  I have reviewed the data as listed    Latest Ref Rng & Units 06/03/2023    8:06 AM 05/28/2023    5:08 PM 05/27/2023    8:19 AM  CBC  WBC 4.0 - 10.5 K/uL 2.8  3.4  3.2   Hemoglobin 13.0 - 17.0 g/dL 9.2  9.2  9.1   Hematocrit 39.0 - 52.0 % 28.0  28.0  28.0   Platelets 150 - 400 K/uL 140  56  50       Latest Ref Rng & Units 06/03/2023    8:05 AM 05/28/2023    5:08 PM 05/27/2023    8:19 AM  CMP  Glucose 70 - 99 mg/dL 295  621  308   BUN 8 - 23 mg/dL 33  34  37   Creatinine 0.61 - 1.24 mg/dL 6.57  8.46  9.62   Sodium 135 - 145 mmol/L 135  136  135   Potassium 3.5 - 5.1 mmol/L 4.7  4.0  4.4   Chloride 98 - 111 mmol/L 107  105  107   CO2 22 - 32 mmol/L 22  22  23    Calcium 8.9 - 10.3 mg/dL 7.4  6.9  6.8  Total Protein 6.5 - 8.1 g/dL 7.5   7.5   Total Bilirubin 0.3 - 1.2 mg/dL 0.7   0.5   Alkaline Phos 38 - 126 U/L 109   131   AST 15 - 41 U/L 20   19   ALT 0 - 44 U/L 40   54    Lab Results  Component Value Date   IRON 100 03/14/2023   TIBC 272 03/14/2023   IRONPCTSAT 37 03/14/2023   FERRITIN 249 03/14/2023     RADIOGRAPHIC STUDIES: I have personally reviewed the radiological images as listed and agreed with the findings in the report. US Venous Img Lower Bilateral  Result Date: 05/28/2023 CLINICAL DATA:  Swelling. EXAM: BILATERAL  LOWER EXTREMITY VENOUS DOPPLER ULTRASOUND TECHNIQUE: Gray-scale sonography with graded compression, as well as color Doppler and duplex ultrasound were performed to evaluate the lower extremity deep venous systems from the level of the common femoral vein and including the common femoral, femoral, profunda femoral, popliteal and calf veins including the posterior tibial, peroneal and gastrocnemius veins when visible. The superficial great saphenous vein was also interrogated. Spectral Doppler was utilized to evaluate flow at rest and with distal augmentation maneuvers in the common femoral, femoral and popliteal veins. COMPARISON:  None Available. FINDINGS: RIGHT LOWER EXTREMITY Common Femoral Vein: No evidence of thrombus. Normal compressibility, respiratory phasicity and response to augmentation. Saphenofemoral Junction: No evidence of thrombus. Normal compressibility and flow on color Doppler imaging. Profunda Femoral Vein: No evidence of thrombus. Normal compressibility and flow on color Doppler imaging. Femoral Vein: No evidence of thrombus. Normal compressibility, respiratory phasicity and response to augmentation. Popliteal Vein: No evidence of thrombus. Normal compressibility, respiratory phasicity and response to augmentation. Calf Veins: No evidence of thrombus. Normal compressibility and flow on color Doppler imaging. Superficial Great Saphenous Vein: No evidence of thrombus. Normal compressibility. Venous Reflux:  None. Other Findings:  None. LEFT LOWER EXTREMITY Common Femoral Vein: No evidence of thrombus. Normal compressibility, respiratory phasicity and response to augmentation. Saphenofemoral Junction: No evidence of thrombus. Normal compressibility and flow on color Doppler imaging. Profunda Femoral Vein: No evidence of thrombus. Normal compressibility and flow on color Doppler imaging. Femoral Vein: No evidence of thrombus. Normal compressibility, respiratory phasicity and response to  augmentation. Popliteal Vein: No evidence of thrombus. Normal compressibility, respiratory phasicity and response to augmentation. Calf Veins: No evidence of thrombus. Normal compressibility and flow on color Doppler imaging. Superficial Great Saphenous Vein: No evidence of thrombus. Normal compressibility. Venous Reflux:  None. Other Findings:  None. IMPRESSION: No evidence of deep venous thrombosis in either lower extremity. Electronically Signed   By: Layla Maw M.D.   On: 05/28/2023 18:02   DG Chest 2 View  Result Date: 05/28/2023 CLINICAL DATA:  Shortness breath EXAM: CHEST - 2 VIEW COMPARISON:  02/2023 FINDINGS: Lungs are clear.  No pleural effusion or pneumothorax. The heart is top normal in size. Visualized osseous structures are within normal limits. IMPRESSION: Normal chest radiographs. Electronically Signed   By: Charline Bills M.D.   On: 05/28/2023 17:21

## 2023-06-03 NOTE — Assessment & Plan Note (Signed)
Check Echo -scheduled on Echo 06/14/23  Refer to cardiology for evaluation.  Start on Eliquis 2.5mg  BID.

## 2023-06-03 NOTE — Assessment & Plan Note (Addendum)
Bone marrow biopsy was reviewed. 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 Proceed with cycle 3 Daratumumab and Velcade with revlimid 10mg  2 weeks on 1 week off He gets Dexamethasone 20mg  weekly prior to Daratumumab treatments.   Continue Acyclovir, At high risk of thrombosis, recommend him to stop Aspirin, switch to Eliquis 2.5mg  BID. Rx sent Proceed with Xegeva today. Rationale and side effects were reviewed with patient and he agrees.  Recommend calcium and vitamin D supplementation.

## 2023-06-03 NOTE — Progress Notes (Signed)
Nutrition Follow-up:  Patient with multiple myeloma.  Patient receiving dartumumab, bortezomib, dexamethasone, lanalidomide.    Met with patient and wife during infusion.  Reports that appetite is good.  Eating 3 meals a day.  Denies any nutrition impact symptoms.      Medications: lasix added today  Labs: reviewed, K WNL  Anthropometrics:   Weight 190 lb (fluid lower extremities) 184 lb on 8/27    NUTRITION DIAGNOSIS: none at this time    INTERVENTION:  Encouraged low sodium diet with increased swelling.       MONITORING, EVALUATION, GOAL: weight trends, intake   NEXT VISIT: Friday, Oct 25 during infusion  Shoshanna Mcquitty B. Freida Busman, RD, LDN Registered Dietitian [phone]

## 2023-06-06 ENCOUNTER — Inpatient Hospital Stay: Payer: Medicare HMO

## 2023-06-06 ENCOUNTER — Encounter: Payer: Self-pay | Admitting: Oncology

## 2023-06-06 ENCOUNTER — Other Ambulatory Visit: Payer: Self-pay

## 2023-06-06 VITALS — BP 159/83 | HR 103 | Resp 16

## 2023-06-06 DIAGNOSIS — Z5112 Encounter for antineoplastic immunotherapy: Secondary | ICD-10-CM | POA: Diagnosis not present

## 2023-06-06 DIAGNOSIS — C9000 Multiple myeloma not having achieved remission: Secondary | ICD-10-CM

## 2023-06-06 MED ORDER — BORTEZOMIB CHEMO SQ INJECTION 3.5 MG (2.5MG/ML)
1.3000 mg/m2 | Freq: Once | INTRAMUSCULAR | Status: AC
  Administered 2023-06-06: 2.5 mg via SUBCUTANEOUS
  Filled 2023-06-06: qty 1

## 2023-06-06 MED ORDER — PROCHLORPERAZINE MALEATE 10 MG PO TABS
10.0000 mg | ORAL_TABLET | Freq: Once | ORAL | Status: AC
  Administered 2023-06-06: 10 mg via ORAL
  Filled 2023-06-06: qty 1

## 2023-06-08 ENCOUNTER — Encounter: Payer: Self-pay | Admitting: Oncology

## 2023-06-10 ENCOUNTER — Encounter: Payer: Self-pay | Admitting: Oncology

## 2023-06-10 ENCOUNTER — Telehealth: Payer: Self-pay

## 2023-06-10 ENCOUNTER — Inpatient Hospital Stay: Payer: Medicare HMO

## 2023-06-10 ENCOUNTER — Inpatient Hospital Stay (HOSPITAL_BASED_OUTPATIENT_CLINIC_OR_DEPARTMENT_OTHER): Payer: Medicare HMO | Admitting: Oncology

## 2023-06-10 VITALS — BP 140/80 | HR 96 | Temp 97.0°F | Resp 18 | Wt 191.1 lb

## 2023-06-10 DIAGNOSIS — I4891 Unspecified atrial fibrillation: Secondary | ICD-10-CM

## 2023-06-10 DIAGNOSIS — N1832 Chronic kidney disease, stage 3b: Secondary | ICD-10-CM

## 2023-06-10 DIAGNOSIS — R600 Localized edema: Secondary | ICD-10-CM

## 2023-06-10 DIAGNOSIS — C9000 Multiple myeloma not having achieved remission: Secondary | ICD-10-CM

## 2023-06-10 DIAGNOSIS — Z5112 Encounter for antineoplastic immunotherapy: Secondary | ICD-10-CM | POA: Diagnosis not present

## 2023-06-10 DIAGNOSIS — D696 Thrombocytopenia, unspecified: Secondary | ICD-10-CM

## 2023-06-10 DIAGNOSIS — Z5111 Encounter for antineoplastic chemotherapy: Secondary | ICD-10-CM | POA: Diagnosis not present

## 2023-06-10 LAB — CBC WITH DIFFERENTIAL (CANCER CENTER ONLY)
Abs Immature Granulocytes: 0.02 10*3/uL (ref 0.00–0.07)
Basophils Absolute: 0 10*3/uL (ref 0.0–0.1)
Basophils Relative: 0 %
Eosinophils Absolute: 0.2 10*3/uL (ref 0.0–0.5)
Eosinophils Relative: 7 %
HCT: 28.4 % — ABNORMAL LOW (ref 39.0–52.0)
Hemoglobin: 9.3 g/dL — ABNORMAL LOW (ref 13.0–17.0)
Immature Granulocytes: 1 %
Lymphocytes Relative: 15 %
Lymphs Abs: 0.5 10*3/uL — ABNORMAL LOW (ref 0.7–4.0)
MCH: 33.1 pg (ref 26.0–34.0)
MCHC: 32.7 g/dL (ref 30.0–36.0)
MCV: 101.1 fL — ABNORMAL HIGH (ref 80.0–100.0)
Monocytes Absolute: 0.3 10*3/uL (ref 0.1–1.0)
Monocytes Relative: 9 %
Neutro Abs: 2.1 10*3/uL (ref 1.7–7.7)
Neutrophils Relative %: 68 %
Platelet Count: 149 10*3/uL — ABNORMAL LOW (ref 150–400)
RBC: 2.81 MIL/uL — ABNORMAL LOW (ref 4.22–5.81)
RDW: 16.6 % — ABNORMAL HIGH (ref 11.5–15.5)
WBC Count: 3.1 10*3/uL — ABNORMAL LOW (ref 4.0–10.5)
nRBC: 0 % (ref 0.0–0.2)

## 2023-06-10 LAB — CMP (CANCER CENTER ONLY)
ALT: 28 U/L (ref 0–44)
AST: 14 U/L — ABNORMAL LOW (ref 15–41)
Albumin: 3.4 g/dL — ABNORMAL LOW (ref 3.5–5.0)
Alkaline Phosphatase: 117 U/L (ref 38–126)
Anion gap: 7 (ref 5–15)
BUN: 25 mg/dL — ABNORMAL HIGH (ref 8–23)
CO2: 22 mmol/L (ref 22–32)
Calcium: 8.2 mg/dL — ABNORMAL LOW (ref 8.9–10.3)
Chloride: 104 mmol/L (ref 98–111)
Creatinine: 1.08 mg/dL (ref 0.61–1.24)
GFR, Estimated: >60 mL/min (ref >60)
Glucose, Bld: 193 mg/dL — ABNORMAL HIGH (ref 70–99)
Potassium: 5 mmol/L (ref 3.5–5.1)
Sodium: 133 mmol/L — ABNORMAL LOW (ref 135–145)
Total Bilirubin: 0.8 mg/dL (ref 0.3–1.2)
Total Protein: 7.1 g/dL (ref 6.5–8.1)

## 2023-06-10 MED ORDER — ACETAMINOPHEN 325 MG PO TABS
650.0000 mg | ORAL_TABLET | Freq: Once | ORAL | Status: AC
  Administered 2023-06-10: 650 mg via ORAL
  Filled 2023-06-10: qty 2

## 2023-06-10 MED ORDER — DEXAMETHASONE 4 MG PO TABS
20.0000 mg | ORAL_TABLET | Freq: Once | ORAL | Status: AC
  Administered 2023-06-10: 20 mg via ORAL
  Filled 2023-06-10: qty 5

## 2023-06-10 MED ORDER — DARATUMUMAB-HYALURONIDASE-FIHJ 1800-30000 MG-UT/15ML ~~LOC~~ SOLN
1800.0000 mg | Freq: Once | SUBCUTANEOUS | Status: AC
  Administered 2023-06-10: 1800 mg via SUBCUTANEOUS
  Filled 2023-06-10: qty 15

## 2023-06-10 MED ORDER — BORTEZOMIB CHEMO SQ INJECTION 3.5 MG (2.5MG/ML)
1.3000 mg/m2 | Freq: Once | INTRAMUSCULAR | Status: AC
  Administered 2023-06-10: 2.5 mg via SUBCUTANEOUS
  Filled 2023-06-10: qty 1

## 2023-06-10 MED ORDER — DIPHENHYDRAMINE HCL 25 MG PO CAPS
50.0000 mg | ORAL_CAPSULE | Freq: Once | ORAL | Status: AC
  Administered 2023-06-10: 50 mg via ORAL
  Filled 2023-06-10: qty 2

## 2023-06-10 NOTE — Progress Notes (Signed)
Pt here for follow up. Reports increased shortness of breath. Pt states that he has not picked up blood thinner due to cost.

## 2023-06-10 NOTE — Assessment & Plan Note (Signed)
 Chemotherapy plan as listed above.   Discussed about antiemetics instruction.

## 2023-06-10 NOTE — Progress Notes (Signed)
Hematology/Oncology Progress note [phone] [phone]         Patient Care Team: Danella Penton, MD as PCP - General (Internal Medicine) [name], MD as Consulting Physician (Oncology)   REFERRING PROVIDER: [name], MD  CHIEF COMPLAINTS/REASON FOR VISIT:  Multiple myeloma   ASSESSMENT & PLAN:   Cancer Staging  Multiple myeloma [facility]) Staging form: Plasma Cell Myeloma and Plasma Cell Disorders, AJCC 8th Edition - Clinical stage from 04/14/2023: RISS Stage II (Beta-2-microglobulin (mg/L): 8.4, Albumin (g/dL): 2.6, ISS: Stage III, High-risk cytogenetics: Absent, LDH: Normal) - Signed by [name], MD on 04/22/2023   Bilateral lower extremity edema No DVT on [place] Check Echo Recommend Lasix 20mg  daily.  Multiple myeloma (HCC) Bone marrow biopsy was reviewed. 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 Proceed with cycle 3 Daratumumab and Velcade with revlimid 10mg  2 weeks on 1 week off He gets Dexamethasone 20mg  weekly prior to Daratumumab treatments.   Continue Acyclovir, At high risk of thrombosis, recommend him to stop Aspirin, switch to Eliquis 2.5mg  BID. Rx sent He can not afford, will check if he may receive drug assistance.   Xegeva monthly.   Recommend calcium and vitamin D supplementation.   CKD (chronic kidney disease) stage 3, GFR 30-59 ml/min (HCC) Encourage oral hydration and avoid nephrotoxins.    Encounter for antineoplastic chemotherapy Chemotherapy plan as listed above.  Discussed about antiemetics instruction.  Atrial fibrillation (HCC) Check Echo -scheduled on Echo 06/14/23  Refer to cardiology for evaluation.  He is not able to afford copay for Eliquis 2.5mg  BID  Thrombocytopenia (HCC) Chemotherapy induced. Close monitor   No orders of the defined types were placed in this  encounter.  Follow-up per LOS   We spent sufficient time to discuss many aspect of care, questions were answered to patient's satisfaction.    [name], MD, PhD [facility] Hematology Oncology 06/10/2023     HISTORY OF PRESENTING ILLNESS:  Victor Phillips is a  67 y.o.  male with PMH listed below who was referred to me for anemia  02/11/2023 - 02/14/2023 recent hospitalization due to pneumonia, respiratory failure.  He was found to have a hemoglobin decreased at 6.8, status post PRBC transfusion during admission.  EGD showed gastritis.  Colonoscopy was not remarkable. 02/11/2023 TIBC 221 ferritin 107, iron saturation 16. Patient is currently taking fusion plus Vitamin B12 level in the 300s. His echo showed grade 2 diastolic CHF.  He denies recent chest pain on exertion, shortness of breath on minimal exertion, pre-syncopal episodes, or palpitations He had not noticed any recent bleeding such as epistaxis, hematuria or hematochezia.  He denies over the counter NSAID ingestion.  Oncology History  Multiple myeloma (HCC)  04/14/2023 Initial Diagnosis   Multiple myeloma   03/13/2020 multiple myeloma panel showed M protein 4.3, IgA lambda Free lamda Level 142,-light chain ratio 0.07 04/05/2023 bone marrow biopsy showed hypercellular bone marrow with plasma cell neoplasm.The bone marrow is hypercellular for age with prominent increase in plasma cells representing 65% of all cells in the aspirate associated with interstitial infiltrates and numerous variably sized aggregates in  the clot and biopsy sections.  The plasma cells display lambda light chain restriction consistent with plasma cell neoplasm.  Normal cytogenetics.  FISH studies pending.    04/14/2023 Cancer Staging   Staging form: Plasma Cell Myeloma  and Plasma Cell Disorders, AJCC 8th Edition - Clinical stage from 04/14/2023: RISS Stage II (Beta-2-microglobulin (mg/L): 8.4, Albumin (g/dL): 2.6, ISS: Stage III, High-risk cytogenetics:  Absent, LDH: Normal) - Signed by [name], MD on 04/22/2023 Stage prefix: Initial diagnosis Beta 2 microglobulin range (mg/L): Greater than or equal to 5.5 Albumin range (g/dL): Less than 3.5 Cytogenetics: 1q addition, Other mutation   04/14/2023 Imaging   Skeletal survey 1. No lytic lesions or other intrinsic bony abnormality. 2. Borderline cardiomegaly with an interval decrease in size. 3. Diffuse atheromatous arterial calcifications including bilateral carotid artery calcifications, right greater than left   04/22/2023 -  Chemotherapy   Patient is on Treatment Plan : MYELOMA NEWLY DIAGNOSED TRANSPLANT CANDIDATE DaraVRd (Daratumumab SQ) q21d x 6 Cycles (Induction/Consolidation)      He takes Aspirin and Acyclovir  A Fib. Lowe extremity edema bilaterally, and SOB with exertion.  Pt states that he has not picked up blood thinner due to cost.  Echo is scheduled next week.  Denies chest pain nausea, vomiting, diarrhea.  He took lasix daily PRN, leg swelling has improved.    MEDICAL HISTORY:  Past Medical History:  Diagnosis Date   Diabetes mellitus without complication (HCC)    Hypertension     SURGICAL HISTORY: Past Surgical History:  Procedure Laterality Date   COLONOSCOPY N/A 02/13/2023   Procedure: COLONOSCOPY;  Surgeon: Toledo, Boykin Nearing, MD;  Location: [facility] END[facility]COPY;  Service: Gastroenterology;  Laterality: N/A;   COLONOSCOPY N/A 02/14/2023   Procedure: COLONOSCOPY;  Surgeon: Toledo, Boykin Nearing, MD;  Location: [facility] ENDOSCOPY;  Service: Gastroenterology;  Laterality: N/A;   ESOPHAGOGASTRODUODENOSCOPY N/A 02/13/2023   Procedure: ESOPHAGOGASTRODUODENOSCOPY (EGD);  Surgeon: Toledo, Boykin Nearing, MD;  Location: [facility] ENDOSCOPY;  Service: Gastroenterology;  Laterality: N/A;    SOCIAL HISTORY: Social History   Socioeconomic History   Marital status: Married    Spouse name: Not on file   Number of children: Not on file   Years of education: Not on file   Highest education level: Not on  file  Occupational History   Not on file  Tobacco Use   Smoking status: Never   Smokeless tobacco: Never  Vaping Use   Vaping status: Never Used  Substance and Sexual Activity   Alcohol use: Not Currently    Comment: quit approx 2001   Drug use: No   Sexual activity: Not on file  Other Topics Concern   Not on file  Social History Narrative   Not on file   Social Determinants of Health   Financial Resource Strain: Low Risk  (05/05/2023)   Received from [facility]   Overall Financial Resource Strain (CARDIA)    Difficulty of Paying Living Expenses: Not hard at all  Food Insecurity: No Food Insecurity (05/05/2023)   Received from [facility]   Hunger Vital Sign    Worried About Running Out of Food in the Last Year: Never true    Ran Out of Food in the Last Year: Never true  Transportation Needs: No Transportation Needs (05/05/2023)   Received from [facility] - Transportation    In the past 12 months, has lack of transportation kept you from medical appointments or from getting medications?: No    Lack of Transportation (Non-Medical): No  Physical Activity: Sufficiently Active (09/24/2020)   Received from [facility], [facility]   Exercise Vital Sign    Days of Exercise per Week: 6  days    Minutes of Exercise per Session: 30 min  Stress: No Stress Concern Present (09/24/2020)   Received from [facility], [facility]   [employer] of Occupational Health - Occupational Stress Questionnaire    Feeling of Stress : Not at all  Social Connections: Socially Integrated (09/24/2020)   Received from [facility], [facility]   Social Connection and Isolation Panel [NHANES]    Frequency of Communication with Friends and Family: More than three times a week    Frequency of Social Gatherings with Friends and Family:  Once a week    Attends Religious Services: More than 4 times per year    Active Member of [employer] or Organizations: Yes    Attends Engineer, structural: More than 4 times per year    Marital Status: Married  Catering manager Violence: Not At Risk (03/14/2023)   Humiliation, Afraid, Rape, and Kick questionnaire    Fear of Current or Ex-Partner: No    Emotionally Abused: No    Physically Abused: No    Sexually Abused: No    FAMILY HISTORY: Family History  Problem Relation Age of Onset   Diabetes Mother    Heart attack Father     ALLERGIES:  has No Known Allergies.  MEDICATIONS:  Current Outpatient Medications  Medication Sig Dispense Refill   acetaminophen (TYLENOL) 325 MG tablet Take 2 tablets (650 mg total) by mouth every 6 (six) hours as needed for mild pain (or Fever >/= 101). 90 tablet 1   acyclovir (ZOVIRAX) 400 MG tablet Take 1 tablet (400 mg total) by mouth 2 (two) times daily. 60 tablet 5   aspirin EC 81 MG tablet Take 81 mg by mouth daily. Swallow whole.     atorvastatin (LIPITOR) 10 MG tablet Take 1 tablet by mouth daily.     calcium carbonate (OS-CAL) 600 MG TABS tablet Take 2 tablets (1,200 mg total) by mouth daily.     cholecalciferol (VITAMIN D3) 25 MCG (1000 UNIT) tablet Take 1 tablet (1,000 Units total) by mouth daily.     diltiazem (CARDIZEM CD) 120 MG 24 hr capsule Take 1 capsule (120 mg total) by mouth daily. 30 capsule 0   furosemide (LASIX) 20 MG tablet Take 1 tablet (20 mg total) by mouth daily as needed for edema or fluid. 30 tablet 0   glipiZIDE (GLUCOTROL XL) 2.5 MG 24 hr tablet Take by mouth.     lenalidomide (REVLIMID) 10 MG capsule Take 1 capsule (10 mg total) by mouth daily. Take for 14 days, then hold for 7 days off. Repeat every 21 days. 14 capsule 0   Multiple Vitamin (MULTIVITAMIN WITH MINERALS) TABS tablet Take 1 tablet by mouth daily. 90 tablet 1   olmesartan-hydrochlorothiazide (BENICAR HCT) 20-12.5 MG tablet Take 1 tablet by mouth daily.      ondansetron (ZOFRAN) 8 MG tablet Take 1 tablet (8 mg total) by mouth every 8 (eight) hours as needed for nausea or vomiting. 30 tablet 1   prochlorperazine (COMPAZINE) 10 MG tablet Take 1 tablet (10 mg total) by mouth every 6 (six) hours as needed for nausea or vomiting. 30 tablet 1   senna (SENOKOT) 8.6 MG TABS tablet Take 2 tablets (17.2 mg total) by mouth daily. 120 tablet 0   temazepam (RESTORIL) 15 MG capsule Take by mouth.     apixaban (ELIQUIS) 2.5 MG TABS tablet Take 1 tablet (2.5 mg total) by mouth 2 (two) times  daily. (Patient not taking: Reported on 06/10/2023) 60 tablet 1   No current facility-administered medications for this visit.    Review of Systems  Constitutional:  Positive for fatigue. Negative for appetite change, chills, fever and unexpected weight change.  HENT:   Negative for hearing loss and voice change.   Eyes:  Negative for eye problems and icterus.  Respiratory:  Negative for chest tightness, cough and shortness of breath.   Cardiovascular:  Positive for leg swelling. Negative for chest pain.  Gastrointestinal:  Negative for abdominal distention and abdominal pain.  Endocrine: Negative for hot flashes.  Genitourinary:  Negative for difficulty urinating, dysuria and frequency.   Musculoskeletal:  Negative for arthralgias.  Skin:  Negative for itching and rash.  Neurological:  Negative for light-headedness and numbness.  Hematological:  Negative for adenopathy. Does not bruise/bleed easily.  Psychiatric/Behavioral:  Negative for confusion.     PHYSICAL EXAMINATION: Vitals:   06/10/23 0849  BP: (!) 140/80  Pulse: 96  Resp: 18  Temp: (!) 97 F (36.1 C)  SpO2: 98%   Filed Weights   06/10/23 0849  Weight: 191 lb 1.6 oz (86.7 kg)    Physical Exam Constitutional:      General: He is not in acute distress. HENT:     Head: Normocephalic and atraumatic.  Eyes:     General: No scleral icterus. Cardiovascular:     Rate and Rhythm: Normal rate and  regular rhythm.     Heart sounds: Normal heart sounds.  Pulmonary:     Effort: Pulmonary effort is normal. No respiratory distress.     Comments: Crackles at the base of lung  Abdominal:     General: Bowel sounds are normal. There is no distension.     Palpations: Abdomen is soft.  Musculoskeletal:        General: No deformity. Normal range of motion.     Cervical back: Normal range of motion and neck supple.     Right lower leg: Edema present.     Left lower leg: Edema present.  Skin:    General: Skin is warm and dry.     Findings: No erythema or rash.  Neurological:     Mental Status: He is alert and oriented to person, place, and time. Mental status is at baseline.     Cranial Nerves: No cranial nerve deficit.     Coordination: Coordination normal.  Psychiatric:        Mood and Affect: Mood normal.      LABORATORY DATA:  I have reviewed the data as listed    Latest Ref Rng & Units 06/10/2023    8:15 AM 06/03/2023    8:06 AM 05/28/2023    5:08 PM  CBC  WBC 4.0 - 10.5 K/uL 3.1  2.8  3.4   Hemoglobin 13.0 - 17.0 g/dL 9.3  9.2  9.2   Hematocrit 39.0 - 52.0 % 28.4  28.0  28.0   Platelets 150 - 400 K/uL 149  140  56       Latest Ref Rng & Units 06/10/2023    8:14 AM 06/03/2023    8:05 AM 05/28/2023    5:08 PM  CMP  Glucose 70 - 99 mg/dL 161  096  045   BUN 8 - 23 mg/dL 25  33  34   Creatinine 0.61 - 1.24 mg/dL 4.09  8.11  9.14   Sodium 135 - 145 mmol/L 133  135  136   Potassium 3.5 -  5.1 mmol/L 5.0  4.7  4.0   Chloride 98 - 111 mmol/L 104  107  105   CO2 22 - 32 mmol/L 22  22  22    Calcium 8.9 - 10.3 mg/dL 8.2  7.4  6.9   Total Protein 6.5 - 8.1 g/dL 7.1  7.5    Total Bilirubin 0.3 - 1.2 mg/dL 0.8  0.7    Alkaline Phos 38 - 126 U/L 117  109    AST 15 - 41 U/L 14  20    ALT 0 - 44 U/L 28  40     Lab Results  Component Value Date   IRON 100 03/14/2023   TIBC 272 03/14/2023   IRONPCTSAT 37 03/14/2023   FERRITIN 249 03/14/2023     RADIOGRAPHIC STUDIES: I have  personally reviewed the radiological images as listed and agreed with the findings in the report. US Venous Img Lower Bilateral  Result Date: 05/28/2023 CLINICAL DATA:  Swelling. EXAM: BILATERAL LOWER EXTREMITY VENOUS DOPPLER ULTRASOUND TECHNIQUE: Gray-scale sonography with graded compression, as well as color Doppler and duplex ultrasound were performed to evaluate the lower extremity deep venous systems from the level of the common femoral vein and including the common femoral, femoral, profunda femoral, popliteal and calf veins including the posterior tibial, peroneal and gastrocnemius veins when visible. The superficial great saphenous vein was also interrogated. Spectral Doppler was utilized to evaluate flow at rest and with distal augmentation maneuvers in the common femoral, femoral and popliteal veins. COMPARISON:  None Available. FINDINGS: RIGHT LOWER EXTREMITY Common Femoral Vein: No evidence of thrombus. Normal compressibility, respiratory phasicity and response to augmentation. Saphenofemoral Junction: No evidence of thrombus. Normal compressibility and flow on color Doppler imaging. Profunda Femoral Vein: No evidence of thrombus. Normal compressibility and flow on color Doppler imaging. Femoral Vein: No evidence of thrombus. Normal compressibility, respiratory phasicity and response to augmentation. Popliteal Vein: No evidence of thrombus. Normal compressibility, respiratory phasicity and response to augmentation. Calf Veins: No evidence of thrombus. Normal compressibility and flow on color Doppler imaging. Superficial Great Saphenous Vein: No evidence of thrombus. Normal compressibility. Venous Reflux:  None. Other Findings:  None. LEFT LOWER EXTREMITY Common Femoral Vein: No evidence of thrombus. Normal compressibility, respiratory phasicity and response to augmentation. Saphenofemoral Junction: No evidence of thrombus. Normal compressibility and flow on color Doppler imaging. Profunda Femoral  Vein: No evidence of thrombus. Normal compressibility and flow on color Doppler imaging. Femoral Vein: No evidence of thrombus. Normal compressibility, respiratory phasicity and response to augmentation. Popliteal Vein: No evidence of thrombus. Normal compressibility, respiratory phasicity and response to augmentation. Calf Veins: No evidence of thrombus. Normal compressibility and flow on color Doppler imaging. Superficial Great Saphenous Vein: No evidence of thrombus. Normal compressibility. Venous Reflux:  None. Other Findings:  None. IMPRESSION: No evidence of deep venous thrombosis in either lower extremity. Electronically Signed   By: Layla Maw M.D.   On: 05/28/2023 18:02   DG Chest 2 View  Result Date: 05/28/2023 CLINICAL DATA:  Shortness breath EXAM: CHEST - 2 VIEW COMPARISON:  02/2023 FINDINGS: Lungs are clear.  No pleural effusion or pneumothorax. The heart is top normal in size. Visualized osseous structures are within normal limits. IMPRESSION: Normal chest radiographs. Electronically Signed   By: Charline Bills M.D.   On: 05/28/2023 17:21

## 2023-06-10 NOTE — Assessment & Plan Note (Signed)
No DVT on [place] Check Echo Recommend Lasix 20mg  daily.

## 2023-06-10 NOTE — Patient Instructions (Signed)
[place] [facility]  Discharge Instructions: Thank you for choosing [facility] to provide your oncology and hematology care.  If you have a lab appointment with the Cancer Center, please go directly to the Cancer Center and check in at the registration area.  Wear comfortable clothing and clothing appropriate for easy access to any Portacath or PICC line.   We strive to give you quality time with your provider. You may need to reschedule your appointment if you arrive late (15 or more minutes).  Arriving late affects you and other patients whose appointments are after yours.  Also, if you miss three or more appointments without notifying the office, you may be dismissed from the clinic at the provider's discretion.      For prescription refill requests, have your pharmacy contact our office and allow 72 hours for refills to be completed.    Today you received the following chemotherapy and/or immunotherapy agents Daratumumab, Bortezomib.      To help prevent nausea and vomiting after your treatment, we encourage you to take your nausea medication as directed.  BELOW ARE SYMPTOMS THAT SHOULD BE REPORTED IMMEDIATELY: *FEVER GREATER THAN 100.4 F (38 C) OR HIGHER *CHILLS OR SWEATING *NAUSEA AND VOMITING THAT IS NOT CONTROLLED WITH YOUR NAUSEA MEDICATION *UNUSUAL SHORTNESS OF BREATH *UNUSUAL BRUISING OR BLEEDING *URINARY PROBLEMS (pain or burning when urinating, or frequent urination) *BOWEL PROBLEMS (unusual diarrhea, constipation, pain near the anus) TENDERNESS IN MOUTH AND THROAT WITH OR WITHOUT PRESENCE OF ULCERS (sore throat, sores in mouth, or a toothache) UNUSUAL RASH, SWELLING OR PAIN  UNUSUAL VAGINAL DISCHARGE OR ITCHING   Items with * indicate a potential emergency and should be followed up as soon as possible or go to the Emergency Department if any problems should occur.  Please show the CHEMOTHERAPY ALERT CARD or IMMUNOTHERAPY ALERT CARD  at check-in to the Emergency Department and triage nurse.  Should you have questions after your visit or need to cancel or reschedule your appointment, please contact [place] [facility]  [phone] and follow the prompts.  Office hours are 8:00 a.m. to 4:30 p.m. Monday - Friday. Please note that voicemails left after 4:00 p.m. may not be returned until the following business day.  We are closed weekends and major holidays. You have access to a nurse at all times for urgent questions. Please call the main number to the clinic [phone] and follow the prompts.  For any non-urgent questions, you may also contact your provider using MyChart. We now offer e-Visits for anyone 35 and older to request care online for non-urgent symptoms. For details visit [email or web address].   Also download the MyChart app! Go to the app store, search "MyChart", open the app, select [place], and log in with your MyChart username and password.

## 2023-06-10 NOTE — Assessment & Plan Note (Signed)
Encourage oral hydration and avoid nephrotoxins.   

## 2023-06-10 NOTE — Assessment & Plan Note (Signed)
Check Echo -scheduled on Echo 06/14/23  Refer to cardiology for evaluation.  He is not able to afford copay for Eliquis 2.5mg  BID

## 2023-06-10 NOTE — Assessment & Plan Note (Signed)
Bone marrow biopsy was reviewed. 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 Proceed with cycle 3 Daratumumab and Velcade with revlimid 10mg  2 weeks on 1 week off He gets Dexamethasone 20mg  weekly prior to Daratumumab treatments.   Continue Acyclovir, At high risk of thrombosis, recommend him to stop Aspirin, switch to Eliquis 2.5mg  BID. Rx sent He can not afford, will check if he may receive drug assistance.   Xegeva monthly.   Recommend calcium and vitamin D supplementation.

## 2023-06-10 NOTE — Telephone Encounter (Signed)
Free 30-day tiral offer card given to pt for Eliquis. Called pharmacy to provide card information and pharmacist said it was approved and pt can pick up medication today. Pt and wife informed of this.   Reached out to [facility], for further Eliquis pt assisance set up. Pt and wife aware that they will be contacted for further infor.

## 2023-06-10 NOTE — Assessment & Plan Note (Signed)
Chemotherapy induced. Close monitor 

## 2023-06-13 ENCOUNTER — Inpatient Hospital Stay: Payer: Medicare HMO

## 2023-06-13 VITALS — BP 130/76 | HR 87 | Temp 97.9°F

## 2023-06-13 DIAGNOSIS — C9000 Multiple myeloma not having achieved remission: Secondary | ICD-10-CM

## 2023-06-13 DIAGNOSIS — Z5112 Encounter for antineoplastic immunotherapy: Secondary | ICD-10-CM | POA: Diagnosis not present

## 2023-06-13 MED ORDER — BORTEZOMIB CHEMO SQ INJECTION 3.5 MG (2.5MG/ML)
1.3000 mg/m2 | Freq: Once | INTRAMUSCULAR | Status: AC
  Administered 2023-06-13: 2.5 mg via SUBCUTANEOUS
  Filled 2023-06-13: qty 1

## 2023-06-13 MED ORDER — PROCHLORPERAZINE MALEATE 10 MG PO TABS
10.0000 mg | ORAL_TABLET | Freq: Once | ORAL | Status: AC
  Administered 2023-06-13: 10 mg via ORAL
  Filled 2023-06-13: qty 1

## 2023-06-14 ENCOUNTER — Telehealth: Payer: Self-pay

## 2023-06-14 ENCOUNTER — Ambulatory Visit
Admission: RE | Admit: 2023-06-14 | Discharge: 2023-06-14 | Disposition: A | Discharge status: Home / Self Care | Payer: Medicare HMO | Source: Ambulatory Visit | Attending: Oncology | Admitting: Oncology

## 2023-06-14 DIAGNOSIS — R600 Localized edema: Secondary | ICD-10-CM | POA: Diagnosis not present

## 2023-06-14 DIAGNOSIS — M7989 Other specified soft tissue disorders: Secondary | ICD-10-CM | POA: Diagnosis present

## 2023-06-14 DIAGNOSIS — I517 Cardiomegaly: Secondary | ICD-10-CM | POA: Insufficient documentation

## 2023-06-14 LAB — ECHOCARDIOGRAM COMPLETE
AR max vel: 2.05 cm2
AV Area VTI: 2.21 cm2
AV Area mean vel: 2 cm2
AV Mean grad: 6 mm[Hg]
AV Peak grad: 9.4 mm[Hg]
Ao pk vel: 1.53 m/s
Area-P 1/2: 4.99 cm2
Calc EF: 46.7 %
S' Lateral: 3.1 cm
Single Plane A2C EF: 52.2 %
Single Plane A4C EF: 38.2 %

## 2023-06-14 NOTE — Progress Notes (Signed)
*  PRELIMINARY RESULTS* Echocardiogram 2D Echocardiogram has been performed.  Victor Phillips 06/14/2023, 10:32 AM

## 2023-06-14 NOTE — Telephone Encounter (Signed)
Referral to Dr. Pandora Leiter ([facility]) has been faxed to [phone]. Fax confirmation received.  Ph: [phone]

## 2023-06-16 ENCOUNTER — Other Ambulatory Visit: Payer: Self-pay | Admitting: *Deleted

## 2023-06-16 DIAGNOSIS — C9000 Multiple myeloma not having achieved remission: Secondary | ICD-10-CM

## 2023-06-17 ENCOUNTER — Ambulatory Visit: Payer: Medicare HMO

## 2023-06-17 ENCOUNTER — Ambulatory Visit: Payer: Medicare HMO | Admitting: Oncology

## 2023-06-17 ENCOUNTER — Encounter: Payer: Self-pay | Admitting: Oncology

## 2023-06-17 ENCOUNTER — Inpatient Hospital Stay: Payer: Medicare HMO

## 2023-06-17 ENCOUNTER — Inpatient Hospital Stay: Payer: Medicare HMO | Attending: Oncology

## 2023-06-17 ENCOUNTER — Other Ambulatory Visit: Payer: Self-pay | Admitting: Oncology

## 2023-06-17 DIAGNOSIS — C9000 Multiple myeloma not having achieved remission: Secondary | ICD-10-CM

## 2023-06-17 DIAGNOSIS — Z5112 Encounter for antineoplastic immunotherapy: Secondary | ICD-10-CM | POA: Insufficient documentation

## 2023-06-17 DIAGNOSIS — Z79899 Other long term (current) drug therapy: Secondary | ICD-10-CM | POA: Insufficient documentation

## 2023-06-17 LAB — CBC WITH DIFFERENTIAL (CANCER CENTER ONLY)
Abs Immature Granulocytes: 0.03 10*3/uL (ref 0.00–0.07)
Basophils Absolute: 0 10*3/uL (ref 0.0–0.1)
Basophils Relative: 0 %
Eosinophils Absolute: 0.3 10*3/uL (ref 0.0–0.5)
Eosinophils Relative: 7 %
HCT: 28.8 % — ABNORMAL LOW (ref 39.0–52.0)
Hemoglobin: 9.4 g/dL — ABNORMAL LOW (ref 13.0–17.0)
Immature Granulocytes: 1 %
Lymphocytes Relative: 9 %
Lymphs Abs: 0.4 10*3/uL — ABNORMAL LOW (ref 0.7–4.0)
MCH: 33 pg (ref 26.0–34.0)
MCHC: 32.6 g/dL (ref 30.0–36.0)
MCV: 101.1 fL — ABNORMAL HIGH (ref 80.0–100.0)
Monocytes Absolute: 0.6 10*3/uL (ref 0.1–1.0)
Monocytes Relative: 13 %
Neutro Abs: 3 10*3/uL (ref 1.7–7.7)
Neutrophils Relative %: 70 %
Platelet Count: 72 10*3/uL — ABNORMAL LOW (ref 150–400)
RBC: 2.85 MIL/uL — ABNORMAL LOW (ref 4.22–5.81)
RDW: 16.3 % — ABNORMAL HIGH (ref 11.5–15.5)
WBC Count: 4.3 10*3/uL (ref 4.0–10.5)
nRBC: 0 % (ref 0.0–0.2)

## 2023-06-17 LAB — CMP (CANCER CENTER ONLY)
ALT: 33 U/L (ref 0–44)
AST: 15 U/L (ref 15–41)
Albumin: 3.5 g/dL (ref 3.5–5.0)
Alkaline Phosphatase: 97 U/L (ref 38–126)
Anion gap: 6 (ref 5–15)
BUN: 31 mg/dL — ABNORMAL HIGH (ref 8–23)
CO2: 23 mmol/L (ref 22–32)
Calcium: 7.3 mg/dL — ABNORMAL LOW (ref 8.9–10.3)
Chloride: 107 mmol/L (ref 98–111)
Creatinine: 1.16 mg/dL (ref 0.61–1.24)
GFR, Estimated: >60 mL/min (ref >60)
Glucose, Bld: 143 mg/dL — ABNORMAL HIGH (ref 70–99)
Potassium: 4.8 mmol/L (ref 3.5–5.1)
Sodium: 136 mmol/L (ref 135–145)
Total Bilirubin: 0.8 mg/dL (ref 0.3–1.2)
Total Protein: 6.7 g/dL (ref 6.5–8.1)

## 2023-06-17 MED ORDER — LENALIDOMIDE 10 MG PO CAPS
10.0000 mg | ORAL_CAPSULE | Freq: Every day | ORAL | 0 refills | Status: DC
Start: 2023-06-17 — End: 2023-07-18

## 2023-06-22 NOTE — Telephone Encounter (Signed)
Pt is schedule at [facility] on 07/04/23

## 2023-06-24 ENCOUNTER — Ambulatory Visit: Payer: Medicare HMO

## 2023-06-24 ENCOUNTER — Other Ambulatory Visit: Payer: Medicare HMO

## 2023-06-24 ENCOUNTER — Ambulatory Visit: Payer: Medicare HMO | Admitting: Oncology

## 2023-06-27 ENCOUNTER — Ambulatory Visit: Payer: Medicare HMO

## 2023-06-28 ENCOUNTER — Telehealth: Payer: Self-pay | Admitting: Oncology

## 2023-06-28 NOTE — Telephone Encounter (Signed)
Patient can not come on 10/21 for treatment- he has appointment at [facility]. He is available 10/22. Please advise.

## 2023-06-30 ENCOUNTER — Encounter: Payer: Self-pay | Admitting: Oncology

## 2023-07-01 ENCOUNTER — Ambulatory Visit: Payer: Medicare HMO | Admitting: Oncology

## 2023-07-01 ENCOUNTER — Inpatient Hospital Stay: Payer: Medicare HMO

## 2023-07-01 ENCOUNTER — Ambulatory Visit: Payer: Medicare HMO

## 2023-07-01 ENCOUNTER — Other Ambulatory Visit: Payer: Medicare HMO

## 2023-07-01 ENCOUNTER — Encounter: Payer: Self-pay | Admitting: Oncology

## 2023-07-01 ENCOUNTER — Inpatient Hospital Stay (HOSPITAL_BASED_OUTPATIENT_CLINIC_OR_DEPARTMENT_OTHER): Payer: Medicare HMO | Admitting: Oncology

## 2023-07-01 VITALS — BP 153/94 | HR 95 | Temp 97.2°F | Resp 19 | Wt 176.2 lb

## 2023-07-01 VITALS — BP 141/90 | HR 74 | Temp 97.0°F | Resp 18

## 2023-07-01 DIAGNOSIS — I4891 Unspecified atrial fibrillation: Secondary | ICD-10-CM

## 2023-07-01 DIAGNOSIS — C9000 Multiple myeloma not having achieved remission: Secondary | ICD-10-CM | POA: Diagnosis not present

## 2023-07-01 DIAGNOSIS — Z5111 Encounter for antineoplastic chemotherapy: Secondary | ICD-10-CM

## 2023-07-01 DIAGNOSIS — N1832 Chronic kidney disease, stage 3b: Secondary | ICD-10-CM

## 2023-07-01 DIAGNOSIS — R600 Localized edema: Secondary | ICD-10-CM

## 2023-07-01 DIAGNOSIS — E119 Type 2 diabetes mellitus without complications: Secondary | ICD-10-CM

## 2023-07-01 DIAGNOSIS — Z5112 Encounter for antineoplastic immunotherapy: Secondary | ICD-10-CM | POA: Diagnosis not present

## 2023-07-01 DIAGNOSIS — D696 Thrombocytopenia, unspecified: Secondary | ICD-10-CM

## 2023-07-01 LAB — CMP (CANCER CENTER ONLY)
ALT: 16 U/L (ref 0–44)
AST: 13 U/L — ABNORMAL LOW (ref 15–41)
Albumin: 3.7 g/dL (ref 3.5–5.0)
Alkaline Phosphatase: 112 U/L (ref 38–126)
Anion gap: 6 (ref 5–15)
BUN: 24 mg/dL — ABNORMAL HIGH (ref 8–23)
CO2: 24 mmol/L (ref 22–32)
Calcium: 9 mg/dL (ref 8.9–10.3)
Chloride: 104 mmol/L (ref 98–111)
Creatinine: 1.14 mg/dL (ref 0.61–1.24)
GFR, Estimated: >60 mL/min (ref >60)
Glucose, Bld: 294 mg/dL — ABNORMAL HIGH (ref 70–99)
Potassium: 4.4 mmol/L (ref 3.5–5.1)
Sodium: 134 mmol/L — ABNORMAL LOW (ref 135–145)
Total Bilirubin: 0.6 mg/dL (ref 0.3–1.2)
Total Protein: 7.5 g/dL (ref 6.5–8.1)

## 2023-07-01 LAB — CBC WITH DIFFERENTIAL (CANCER CENTER ONLY)
Abs Immature Granulocytes: 0.02 10*3/uL (ref 0.00–0.07)
Basophils Absolute: 0 10*3/uL (ref 0.0–0.1)
Basophils Relative: 1 %
Eosinophils Absolute: 0.1 10*3/uL (ref 0.0–0.5)
Eosinophils Relative: 2 %
HCT: 30.4 % — ABNORMAL LOW (ref 39.0–52.0)
Hemoglobin: 10.4 g/dL — ABNORMAL LOW (ref 13.0–17.0)
Immature Granulocytes: 1 %
Lymphocytes Relative: 13 %
Lymphs Abs: 0.5 10*3/uL — ABNORMAL LOW (ref 0.7–4.0)
MCH: 33.1 pg (ref 26.0–34.0)
MCHC: 34.2 g/dL (ref 30.0–36.0)
MCV: 96.8 fL (ref 80.0–100.0)
Monocytes Absolute: 0.5 10*3/uL (ref 0.1–1.0)
Monocytes Relative: 11 %
Neutro Abs: 2.9 10*3/uL (ref 1.7–7.7)
Neutrophils Relative %: 72 %
Platelet Count: 223 10*3/uL (ref 150–400)
RBC: 3.14 MIL/uL — ABNORMAL LOW (ref 4.22–5.81)
RDW: 15.4 % (ref 11.5–15.5)
WBC Count: 4 10*3/uL (ref 4.0–10.5)
nRBC: 0 % (ref 0.0–0.2)

## 2023-07-01 MED ORDER — DEXAMETHASONE 4 MG PO TABS
20.0000 mg | ORAL_TABLET | Freq: Once | ORAL | Status: AC
  Administered 2023-07-01: 20 mg via ORAL
  Filled 2023-07-01: qty 5

## 2023-07-01 MED ORDER — BORTEZOMIB CHEMO SQ INJECTION 3.5 MG (2.5MG/ML)
1.3000 mg/m2 | Freq: Once | INTRAMUSCULAR | Status: AC
  Administered 2023-07-01: 2.5 mg via SUBCUTANEOUS
  Filled 2023-07-01: qty 1

## 2023-07-01 MED ORDER — ACETAMINOPHEN 325 MG PO TABS
650.0000 mg | ORAL_TABLET | Freq: Once | ORAL | Status: AC
  Administered 2023-07-01: 650 mg via ORAL
  Filled 2023-07-01: qty 2

## 2023-07-01 MED ORDER — DENOSUMAB 120 MG/1.7ML ~~LOC~~ SOLN
120.0000 mg | Freq: Once | SUBCUTANEOUS | Status: AC
  Administered 2023-07-01: 120 mg via SUBCUTANEOUS
  Filled 2023-07-01: qty 1.7

## 2023-07-01 MED ORDER — DIPHENHYDRAMINE HCL 25 MG PO CAPS
50.0000 mg | ORAL_CAPSULE | Freq: Once | ORAL | Status: AC
  Administered 2023-07-01: 50 mg via ORAL
  Filled 2023-07-01: qty 2

## 2023-07-01 MED ORDER — DARATUMUMAB-HYALURONIDASE-FIHJ 1800-30000 MG-UT/15ML ~~LOC~~ SOLN
1800.0000 mg | Freq: Once | SUBCUTANEOUS | Status: AC
  Administered 2023-07-01: 1800 mg via SUBCUTANEOUS
  Filled 2023-07-01: qty 15

## 2023-07-01 NOTE — Assessment & Plan Note (Addendum)
No DVT on US Echo LVEF 50% -55%. He has cardiology appt  Recommend Lasix 20mg  daily PRN for edema

## 2023-07-01 NOTE — Assessment & Plan Note (Addendum)
Bone marrow biopsy was reviewed. 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 Cycle 3 D15 Daratumumab was held due to cytopenia and being on anticoagulation.  Proceed with cycle 4 Daratumumab and Velcade with revlimid 10mg  2 weeks on 1 week off - Revlimid start will be delayed next week as patient declined drug delivery this week due to misunderstanding/miscommunication.  He gets Dexamethasone 20mg  weekly prior to Daratumumab treatments.   Continue Acyclovir, At high risk of thrombosis, recommend  Eliquis 2.5mg  BID. Rx sent He got 1 month free trial, pending additional approval for drug assistance.  Xegeva monthly.   Recommend calcium and vitamin D supplementation.   [facility] evaluation for BM transplant next week.

## 2023-07-01 NOTE — Assessment & Plan Note (Signed)
Check Echo -scheduled on Echo 06/14/23  Refer to cardiology for evaluation.  He is not able to afford copay for Eliquis 2.5mg  BID

## 2023-07-01 NOTE — Progress Notes (Signed)
Hematology/Oncology Progress note [phone] [phone]         Patient Care Team: Danella Penton, MD as PCP - General (Internal Medicine) [name], MD as Consulting Physician (Oncology)   REFERRING PROVIDER: Danella Penton, MD  CHIEF COMPLAINTS/REASON FOR VISIT:  Multiple myeloma   ASSESSMENT & PLAN:   Cancer Staging  Multiple myeloma [facility]) Staging form: Plasma Cell Myeloma and Plasma Cell Disorders, AJCC 8th Edition - Clinical stage from 04/14/2023: RISS Stage II (Beta-2-microglobulin (mg/L): 8.4, Albumin (g/dL): 2.6, ISS: Stage III, High-risk cytogenetics: Absent, LDH: Normal) - Signed by [name], MD on 04/22/2023   Multiple myeloma (HCC) Bone marrow biopsy was reviewed. 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 Cycle 3 D15 Daratumumab was held due to cytopenia and being on anticoagulation.  Proceed with cycle 4 Daratumumab and Velcade with revlimid 10mg  2 weeks on 1 week off - Revlimid start will be delayed next week as patient declined drug delivery this week due to misunderstanding/miscommunication.  He gets Dexamethasone 20mg  weekly prior to Daratumumab treatments.   Continue Acyclovir, At high risk of thrombosis, recommend  Eliquis 2.5mg  BID. Rx sent He got 1 month free trial, pending additional approval for drug assistance.  Xegeva monthly.   Recommend calcium and vitamin D supplementation.   [facility] evaluation for BM transplant next week.   CKD (chronic kidney disease) stage 3, GFR 30-59 ml/min (HCC) Encourage oral hydration and avoid nephrotoxins.    Encounter for antineoplastic chemotherapy Chemotherapy plan as listed above.  Discussed about antiemetics instruction.  Atrial fibrillation (HCC) Check Echo -scheduled on Echo 06/14/23  Refer to cardiology for evaluation.  He is not able to  afford copay for Eliquis 2.5mg  BID  Thrombocytopenia (HCC) Chemotherapy induced. Close monitor  Bilateral lower extremity edema No DVT on US Echo LVEF 50% -55%. He has cardiology appt  Recommend Lasix 20mg  daily PRN for edema  Type 2 diabetes mellitus (HCC) He is off metformin. On glipizide  Recommend diabetic diet-  Today's blood sugar was 294, he appears to be non complaint with diabetic diet. During our discussion, he took a candy.  I urge patient to further discuss his diabetic regimen with PCP. May consider resuming metformin as his renal function is better now.     No orders of the defined types were placed in this encounter.  Follow-up per LOS  We spent sufficient time to discuss many aspect of care, questions were answered to patient's satisfaction.    [name], MD, PhD [facility] Hematology Oncology 07/01/2023     HISTORY OF PRESENTING ILLNESS:  Victor Phillips is a  67 y.o.  male with PMH listed below who was referred to me for anemia  02/11/2023 - 02/14/2023 recent hospitalization due to pneumonia, respiratory failure.  He was found to have a hemoglobin decreased at 6.8, status post PRBC transfusion during admission.  EGD showed gastritis.  Colonoscopy was not remarkable. 02/11/2023 TIBC 221 ferritin 107, iron saturation 16. Patient is currently taking fusion plus Vitamin B12 level in the 300s. His echo showed grade 2 diastolic CHF.  He denies recent chest pain on exertion, shortness of breath on minimal exertion, pre-syncopal episodes, or palpitations He had not noticed any recent bleeding such as epistaxis, hematuria or hematochezia.  He denies over the counter NSAID ingestion.  Oncology History  Multiple myeloma (  HCC)  04/14/2023 Initial Diagnosis   Multiple myeloma   03/13/2020 multiple myeloma panel showed M protein 4.3, IgA lambda Free lamda Level 142,-light chain ratio 0.07 04/05/2023 bone marrow biopsy showed hypercellular bone marrow with plasma cell  neoplasm.The bone marrow is hypercellular for age with prominent increase in plasma cells representing 65% of all cells in the aspirate associated with interstitial infiltrates and numerous variably sized aggregates in  the clot and biopsy sections.  The plasma cells display lambda light chain restriction consistent with plasma cell neoplasm.  Normal cytogenetics.  FISH studies pending.    04/14/2023 Cancer Staging   Staging form: Plasma Cell Myeloma and Plasma Cell Disorders, AJCC 8th Edition - Clinical stage from 04/14/2023: RISS Stage II (Beta-2-microglobulin (mg/L): 8.4, Albumin (g/dL): 2.6, ISS: Stage III, High-risk cytogenetics: Absent, LDH: Normal) - Signed by [name], MD on 04/22/2023 Stage prefix: Initial diagnosis Beta 2 microglobulin range (mg/L): Greater than or equal to 5.5 Albumin range (g/dL): Less than 3.5 Cytogenetics: 1q addition, Other mutation   04/14/2023 Imaging   Skeletal survey 1. No lytic lesions or other intrinsic bony abnormality. 2. Borderline cardiomegaly with an interval decrease in size. 3. Diffuse atheromatous arterial calcifications including bilateral carotid artery calcifications, right greater than left   04/22/2023 -  Chemotherapy   Patient is on Treatment Plan : MYELOMA NEWLY DIAGNOSED TRANSPLANT CANDIDATE DaraVRd (Daratumumab SQ) q21d x 6 Cycles (Induction/Consolidation)      He takes Acyclovir  A Fib. Lowe extremity edema bilaterally, and SOB with exertion.  Now on Eliquis 2.5mg  BID.  Denies chest pain nausea, vomiting, diarrhea.  He took lasix daily PRN, leg swelling has improved.    MEDICAL HISTORY:  Past Medical History:  Diagnosis Date   Diabetes mellitus without complication (HCC)    Hypertension     SURGICAL HISTORY: Past Surgical History:  Procedure Laterality Date   COLONOSCOPY N/A 02/13/2023   Procedure: COLONOSCOPY;  Surgeon: Toledo, Boykin Nearing, MD;  Location: [facility] ENDOSCOPY;  Service: Gastroenterology;  Laterality: N/A;   COLONOSCOPY  N/A 02/14/2023   Procedure: COLONOSCOPY;  Surgeon: Toledo, Boykin Nearing, MD;  Location: [facility] ENDOSCOPY;  Service: Gastroenterology;  Laterality: N/A;   ESOPHAGOGASTRODUODENOSCOPY N/A 02/13/2023   Procedure: ESOPHAGOGASTRODUODENOSCOPY (EGD);  Surgeon: Toledo, Boykin Nearing, MD;  Location: [facility] ENDOSCOPY;  Service: Gastroenterology;  Laterality: N/A;    SOCIAL HISTORY: Social History   Socioeconomic History   Marital status: Married    Spouse name: Not on file   Number of children: Not on file   Years of education: Not on file   Highest education level: Not on file  Occupational History   Not on file  Tobacco Use   Smoking status: Never   Smokeless tobacco: Never  Vaping Use   Vaping status: Never Used  Substance and Sexual Activity   Alcohol use: Not Currently    Comment: quit approx 2001   Drug use: No   Sexual activity: Not on file  Other Topics Concern   Not on file  Social History Narrative   Not on file   Social Determinants of Health   Financial Resource Strain: Low Risk  (05/05/2023)   Received from [facility]   Overall Financial Resource Strain (CARDIA)    Difficulty of Paying Living Expenses: Not hard at all  Food Insecurity: No Food Insecurity (05/05/2023)   Received from [facility]   Hunger Vital Sign    Worried About Running Out of Food in the Last Year: Never true  Ran Out of Food in the Last Year: Never true  Transportation Needs: No Transportation Needs (05/05/2023)   Received from [facility] - Transportation    In the past 12 months, has lack of transportation kept you from medical appointments or from getting medications?: No    Lack of Transportation (Non-Medical): No  Physical Activity: Sufficiently Active (09/24/2020)   Received from [facility], [facility]   Exercise Vital Sign    Days of Exercise per Week: 6 days    Minutes of Exercise per Session: 30 min   Stress: No Stress Concern Present (09/24/2020)   Received from [facility], [facility]   [employer] of Occupational Health - Occupational Stress Questionnaire    Feeling of Stress : Not at all  Social Connections: Socially Integrated (09/24/2020)   Received from [facility], [facility]   Social Connection and Isolation Panel [NHANES]    Frequency of Communication with Friends and Family: More than three times a week    Frequency of Social Gatherings with Friends and Family: Once a week    Attends Religious Services: More than 4 times per year    Active Member of Clubs or Organizations: Yes    Attends Engineer, structural: More than 4 times per year    Marital Status: Married  Catering manager Violence: Not At Risk (03/14/2023)   Humiliation, Afraid, Rape, and Kick questionnaire    Fear of Current or Ex-Partner: No    Emotionally Abused: No    Physically Abused: No    Sexually Abused: No    FAMILY HISTORY: Family History  Problem Relation Age of Onset   Diabetes Mother    Heart attack Father     ALLERGIES:  has No Known Allergies.  MEDICATIONS:  Current Outpatient Medications  Medication Sig Dispense Refill   acetaminophen (TYLENOL) 325 MG tablet Take 2 tablets (650 mg total) by mouth every 6 (six) hours as needed for mild pain (or Fever >/= 101). 90 tablet 1   acyclovir (ZOVIRAX) 400 MG tablet Take 1 tablet (400 mg total) by mouth 2 (two) times daily. 60 tablet 5   apixaban (ELIQUIS) 2.5 MG TABS tablet Take 1 tablet (2.5 mg total) by mouth 2 (two) times daily. 60 tablet 1   atorvastatin (LIPITOR) 10 MG tablet Take 1 tablet by mouth daily.     calcium carbonate (OS-CAL) 600 MG TABS tablet Take 2 tablets (1,200 mg total) by mouth daily.     cholecalciferol (VITAMIN D3) 25 MCG (1000 UNIT) tablet Take 1 tablet (1,000 Units total) by mouth daily.     diltiazem (CARDIZEM CD) 120 MG 24 hr capsule  Take 1 capsule (120 mg total) by mouth daily. 30 capsule 0   furosemide (LASIX) 20 MG tablet Take 1 tablet (20 mg total) by mouth daily as needed for edema or fluid. 30 tablet 0   glipiZIDE (GLUCOTROL XL) 2.5 MG 24 hr tablet Take by mouth.     lenalidomide (REVLIMID) 10 MG capsule Take 1 capsule (10 mg total) by mouth daily. Take for 14 days, then hold for 7 days off. Repeat every 21 days. 14 capsule 0   Multiple Vitamin (MULTIVITAMIN WITH MINERALS) TABS tablet Take 1 tablet by mouth daily. 90 tablet 1   olmesartan-hydrochlorothiazide (BENICAR HCT) 20-12.5 MG tablet Take 1 tablet by mouth daily.     ondansetron (ZOFRAN) 8 MG tablet Take 1  tablet (8 mg total) by mouth every 8 (eight) hours as needed for nausea or vomiting. 30 tablet 1   prochlorperazine (COMPAZINE) 10 MG tablet Take 1 tablet (10 mg total) by mouth every 6 (six) hours as needed for nausea or vomiting. 30 tablet 1   senna (SENOKOT) 8.6 MG TABS tablet Take 2 tablets (17.2 mg total) by mouth daily. 120 tablet 0   temazepam (RESTORIL) 15 MG capsule Take by mouth.     No current facility-administered medications for this visit.   Facility-Administered Medications Ordered in Other Visits  Medication Dose Route Frequency Provider Last Rate Last Admin   bortezomib SQ (VELCADE) chemo injection (2.5mg /mL concentration) 2.5 mg  1.3 mg/m2 (Treatment Plan Recorded) Subcutaneous Once [name], MD       daratumumab-hyaluronidase-fihj [facility] FASPRO) 1800-30000 MG-UT/15ML chemo SQ injection 1,800 mg  1,800 mg Subcutaneous Once [name], MD        Review of Systems  Constitutional:  Positive for fatigue. Negative for appetite change, chills, fever and unexpected weight change.  HENT:   Negative for hearing loss and voice change.   Eyes:  Negative for eye problems and icterus.  Respiratory:  Negative for chest tightness, cough and shortness of breath.   Cardiovascular:  Positive for leg swelling. Negative for chest pain.  Gastrointestinal:   Negative for abdominal distention and abdominal pain.  Endocrine: Negative for hot flashes.  Genitourinary:  Negative for difficulty urinating, dysuria and frequency.   Musculoskeletal:  Negative for arthralgias.  Skin:  Negative for itching and rash.  Neurological:  Negative for light-headedness and numbness.  Hematological:  Negative for adenopathy. Does not bruise/bleed easily.  Psychiatric/Behavioral:  Negative for confusion.     PHYSICAL EXAMINATION: Vitals:   07/01/23 0837  BP: (!) 153/94  Pulse: 95  Resp: 19  Temp: (!) 97.2 F (36.2 C)  SpO2: 98%   Filed Weights   07/01/23 0837  Weight: 176 lb 3.2 oz (79.9 kg)    Physical Exam Constitutional:      General: He is not in acute distress. HENT:     Head: Normocephalic and atraumatic.  Eyes:     General: No scleral icterus. Cardiovascular:     Rate and Rhythm: Normal rate and regular rhythm.     Heart sounds: Normal heart sounds.  Pulmonary:     Effort: Pulmonary effort is normal. No respiratory distress.     Comments: Crackles at the base of lung  Abdominal:     General: Bowel sounds are normal. There is no distension.     Palpations: Abdomen is soft.  Musculoskeletal:        General: No deformity. Normal range of motion.     Cervical back: Normal range of motion and neck supple.     Right lower leg: Edema present.     Left lower leg: Edema present.  Skin:    General: Skin is warm and dry.     Findings: No erythema or rash.  Neurological:     Mental Status: He is alert and oriented to person, place, and time. Mental status is at baseline.     Cranial Nerves: No cranial nerve deficit.     Coordination: Coordination normal.  Psychiatric:        Mood and Affect: Mood normal.      LABORATORY DATA:  I have reviewed the data as listed    Latest Ref Rng & Units 07/01/2023    8:05 AM 06/17/2023    8:29 AM 06/10/2023  8:15 AM  CBC  WBC 4.0 - 10.5 K/uL 4.0  4.3  3.1   Hemoglobin 13.0 - 17.0 g/dL 47.4  9.4   9.3   Hematocrit 39.0 - 52.0 % 30.4  28.8  28.4   Platelets 150 - 400 K/uL 223  72  149       Latest Ref Rng & Units 07/01/2023    8:05 AM 06/17/2023    8:29 AM 06/10/2023    8:14 AM  CMP  Glucose 70 - 99 mg/dL 259  563  875   BUN 8 - 23 mg/dL 24  31  25    Creatinine 0.61 - 1.24 mg/dL 6.43  3.29  5.18   Sodium 135 - 145 mmol/L 134  136  133   Potassium 3.5 - 5.1 mmol/L 4.4  4.8  5.0   Chloride 98 - 111 mmol/L 104  107  104   CO2 22 - 32 mmol/L 24  23  22    Calcium 8.9 - 10.3 mg/dL 9.0  7.3  8.2   Total Protein 6.5 - 8.1 g/dL 7.5  6.7  7.1   Total Bilirubin 0.3 - 1.2 mg/dL 0.6  0.8  0.8   Alkaline Phos 38 - 126 U/L 112  97  117   AST 15 - 41 U/L 13  15  14    ALT 0 - 44 U/L 16  33  28    Lab Results  Component Value Date   IRON 100 03/14/2023   TIBC 272 03/14/2023   IRONPCTSAT 37 03/14/2023   FERRITIN 249 03/14/2023     RADIOGRAPHIC STUDIES: I have personally reviewed the radiological images as listed and agreed with the findings in the report. ECHOCARDIOGRAM COMPLETE  Result Date: 06/14/2023    ECHOCARDIOGRAM REPORT   Patient Name:   Lorain L Weckerly Date of Exam: 06/14/2023 Medical Rec #:  [id number]        Height:       68.0 in Accession #:    [id number]       Weight:       191.1 lb Date of Birth:  [birth year]       BSA:          2.005 m Patient Age:    66 years         BP:           130/76 mmHg Patient Gender: M                HR:           87 bpm. Exam Location:  [facility] Procedure: 2D Echo, Cardiac Doppler and Color Doppler Indications:     Leg swelling M79.89  History:         Patient has prior history of Echocardiogram examinations, most                  recent 02/13/2023.  Sonographer:     Cristela Blue Referring Phys:  [id number] Cerys Winget Diagnosing Phys: Yvonne Kendall MD IMPRESSIONS  1. Left ventricular ejection fraction, by estimation, is 50 to 55%. The left ventricle has low normal function. The left ventricle has no regional wall motion abnormalities. There is mild left ventricular  hypertrophy. Left ventricular diastolic function  could not be evaluated.  2. Right ventricular systolic function is normal. The right ventricular size is normal. There is moderately elevated pulmonary artery systolic pressure.  3. Right atrial size was mildly dilated.  4. The mitral valve is normal in  structure. Mild mitral valve regurgitation.  5. The aortic valve is tricuspid. Aortic valve regurgitation is not visualized. No aortic stenosis is present.  6. The inferior vena cava is normal in size with <50% respiratory variability, suggesting right atrial pressure of 8 mmHg. FINDINGS  Left Ventricle: Left ventricular ejection fraction, by estimation, is 50 to 55%. The left ventricle has low normal function. The left ventricle has no regional wall motion abnormalities. The left ventricular internal cavity size was normal in size. There is mild left ventricular hypertrophy. Left ventricular diastolic function could not be evaluated due to atrial fibrillation. Left ventricular diastolic function could not be evaluated. Right Ventricle: The right ventricular size is normal. No increase in right ventricular wall thickness. Right ventricular systolic function is normal. There is moderately elevated pulmonary artery systolic pressure. The tricuspid regurgitant velocity is 3.03 m/s, and with an assumed right atrial pressure of 8 mmHg, the estimated right ventricular systolic pressure is 44.7 mmHg. Left Atrium: Left atrial size was normal in size. Right Atrium: Right atrial size was mildly dilated. Pericardium: There is no evidence of pericardial effusion. Mitral Valve: The mitral valve is normal in structure. Mild mitral valve regurgitation. Tricuspid Valve: The tricuspid valve is normal in structure. Tricuspid valve regurgitation is trivial. Aortic Valve: The aortic valve is tricuspid. There is mild aortic valve annular calcification. Aortic valve regurgitation is not visualized. No aortic stenosis is present. Aortic  valve mean gradient measures 6.0 mmHg. Aortic valve peak gradient measures 9.4 mmHg. Aortic valve area, by VTI measures 2.21 cm. Pulmonic Valve: The pulmonic valve was normal in structure. Pulmonic valve regurgitation is trivial. No evidence of pulmonic stenosis. Aorta: The aortic root is normal in size and structure. Pulmonary Artery: The pulmonary artery is of normal size. Venous: The inferior vena cava is normal in size with less than 50% respiratory variability, suggesting right atrial pressure of 8 mmHg. IAS/Shunts: No atrial level shunt detected by color flow Doppler.  LEFT VENTRICLE PLAX 2D LVIDd:         4.40 cm LVIDs:         3.10 cm LV PW:         1.30 cm LV IVS:        1.20 cm LVOT diam:     2.00 cm LV SV:         59 LV SV Index:   29 LVOT Area:     3.14 cm  LV Volumes (MOD) LV vol d, MOD A2C: 121.0 ml LV vol d, MOD A4C: 86.7 ml LV vol s, MOD A2C: 57.8 ml LV vol s, MOD A4C: 53.6 ml LV SV MOD A2C:     63.1 ml LV SV MOD A4C:     86.7 ml LV SV MOD BP:      50.7 ml RIGHT VENTRICLE RV Basal diam:  3.50 cm RV Mid diam:    3.80 cm RV S prime:     11.20 cm/s TAPSE (M-mode): 1.8 cm LEFT ATRIUM             Index        RIGHT ATRIUM           Index LA diam:        4.70 cm 2.34 cm/m   RA Area:     19.50 cm LA Vol (A2C):   43.8 ml 21.85 ml/m  RA Volume:   51.60 ml  25.74 ml/m LA Vol (A4C):   26.6 ml 13.27 ml/m LA Biplane Vol: 36.1  ml 18.01 ml/m  AORTIC VALVE AV Area (Vmax):    2.05 cm AV Area (Vmean):   2.00 cm AV Area (VTI):     2.21 cm AV Vmax:           153.00 cm/s AV Vmean:          108.000 cm/s AV VTI:            0.267 m AV Peak Grad:      9.4 mmHg AV Mean Grad:      6.0 mmHg LVOT Vmax:         99.80 cm/s LVOT Vmean:        68.800 cm/s LVOT VTI:          0.188 m LVOT/AV VTI ratio: 0.70  AORTA Ao Root diam: 3.60 cm MITRAL VALVE                TRICUSPID VALVE MV Area (PHT): 4.99 cm     TR Peak grad:   36.7 mmHg MV Decel Time: 152 msec     TR Vmax:        303.00 cm/s MV E velocity: 122.00 cm/s                              SHUNTS                             Systemic VTI:  0.19 m                             Systemic Diam: 2.00 cm Yvonne Kendall MD Electronically signed by Yvonne Kendall MD Signature Date/Time: 06/14/2023/2:06:05 PM    Final

## 2023-07-01 NOTE — Patient Instructions (Signed)
[place] [facility]  Discharge Instructions: Thank you for choosing [facility] to provide your oncology and hematology care.  If you have a lab appointment with the Cancer Center, please go directly to the Cancer Center and check in at the registration area.  Wear comfortable clothing and clothing appropriate for easy access to any Portacath or PICC line.   We strive to give you quality time with your provider. You may need to reschedule your appointment if you arrive late (15 or more minutes).  Arriving late affects you and other patients whose appointments are after yours.  Also, if you miss three or more appointments without notifying the office, you may be dismissed from the clinic at the provider's discretion.      For prescription refill requests, have your pharmacy contact our office and allow 72 hours for refills to be completed.    Today you received the following chemotherapy and/or immunotherapy agents Darzalex, Velcade      To help prevent nausea and vomiting after your treatment, we encourage you to take your nausea medication as directed.  BELOW ARE SYMPTOMS THAT SHOULD BE REPORTED IMMEDIATELY: *FEVER GREATER THAN 100.4 F (38 C) OR HIGHER *CHILLS OR SWEATING *NAUSEA AND VOMITING THAT IS NOT CONTROLLED WITH YOUR NAUSEA MEDICATION *UNUSUAL SHORTNESS OF BREATH *UNUSUAL BRUISING OR BLEEDING *URINARY PROBLEMS (pain or burning when urinating, or frequent urination) *BOWEL PROBLEMS (unusual diarrhea, constipation, pain near the anus) TENDERNESS IN MOUTH AND THROAT WITH OR WITHOUT PRESENCE OF ULCERS (sore throat, sores in mouth, or a toothache) UNUSUAL RASH, SWELLING OR PAIN  UNUSUAL VAGINAL DISCHARGE OR ITCHING   Items with * indicate a potential emergency and should be followed up as soon as possible or go to the Emergency Department if any problems should occur.  Please show the CHEMOTHERAPY ALERT CARD or IMMUNOTHERAPY ALERT CARD at  check-in to the Emergency Department and triage nurse.  Should you have questions after your visit or need to cancel or reschedule your appointment, please contact [place] [facility]  [phone] and follow the prompts.  Office hours are 8:00 a.m. to 4:30 p.m. Monday - Friday. Please note that voicemails left after 4:00 p.m. may not be returned until the following business day.  We are closed weekends and major holidays. You have access to a nurse at all times for urgent questions. Please call the main number to the clinic [phone] and follow the prompts.  For any non-urgent questions, you may also contact your provider using MyChart. We now offer e-Visits for anyone 49 and older to request care online for non-urgent symptoms. For details visit [email or web address].   Also download the MyChart app! Go to the app store, search "MyChart", open the app, select [place], and log in with your MyChart username and password.

## 2023-07-01 NOTE — Progress Notes (Signed)
Patient has no concerns today. 

## 2023-07-01 NOTE — Assessment & Plan Note (Signed)
Chemotherapy induced. Close monitor 

## 2023-07-01 NOTE — Progress Notes (Signed)
Nutrition Follow-up:  Patient with multiple myeloma.  Patient receiving dartumumab, bortezomib, dexamethasone, lanalidomide.    Met with patient during infusion. Reports continued good/normal appetite.  No nutrition impact symptoms reported   Medications: calcium and vit d  Labs: reviewed  Anthropometrics:   Weight 176 lb (fluid weight loss) 190 lb on 9/20 (excess fluid)  184 lb on 8/27   NUTRITION DIAGNOSIS: None at this time   INTERVENTION:  Reviewed low sodium diet to help with fluid.  Patient does not have salt in the house per wife. Reviewed foods with hidden sodium    MONITORING, EVALUATION, GOAL: weight trends, intake   NEXT VISIT: as needed  Rabon Scholle B. Freida Busman, RD, LDN Registered Dietitian [phone]

## 2023-07-01 NOTE — Assessment & Plan Note (Signed)
 Chemotherapy plan as listed above.   Discussed about antiemetics instruction.

## 2023-07-01 NOTE — Addendum Note (Signed)
Addended by: [name] on: 07/01/2023 11:25 AM   Modules accepted: Orders

## 2023-07-01 NOTE — Assessment & Plan Note (Signed)
Encourage oral hydration and avoid nephrotoxins.   

## 2023-07-01 NOTE — Assessment & Plan Note (Signed)
He is off metformin. On glipizide  Recommend diabetic diet-  Today's blood sugar was 294, he appears to be non complaint with diabetic diet. During our discussion, he took a candy.  I urge patient to further discuss his diabetic regimen with PCP. May consider resuming metformin as his renal function is better now.

## 2023-07-04 ENCOUNTER — Encounter: Payer: Self-pay | Admitting: Oncology

## 2023-07-04 ENCOUNTER — Ambulatory Visit: Payer: Medicare HMO

## 2023-07-04 LAB — KAPPA/LAMBDA LIGHT CHAINS
Kappa free light chain: 9.5 mg/L (ref 3.3–19.4)
Kappa, lambda light chain ratio: 0.12 — ABNORMAL LOW (ref 0.26–1.65)
Lambda free light chains: 80.5 mg/L — ABNORMAL HIGH (ref 5.7–26.3)

## 2023-07-05 ENCOUNTER — Inpatient Hospital Stay: Payer: Medicare HMO

## 2023-07-05 VITALS — BP 128/82 | HR 72 | Temp 97.1°F | Resp 16

## 2023-07-05 DIAGNOSIS — Z5112 Encounter for antineoplastic immunotherapy: Secondary | ICD-10-CM | POA: Diagnosis not present

## 2023-07-05 DIAGNOSIS — C9000 Multiple myeloma not having achieved remission: Secondary | ICD-10-CM

## 2023-07-05 MED ORDER — PROCHLORPERAZINE MALEATE 10 MG PO TABS
10.0000 mg | ORAL_TABLET | Freq: Once | ORAL | Status: AC
  Administered 2023-07-05: 10 mg via ORAL
  Filled 2023-07-05: qty 1

## 2023-07-05 MED ORDER — BORTEZOMIB CHEMO SQ INJECTION 3.5 MG (2.5MG/ML)
1.3000 mg/m2 | Freq: Once | INTRAMUSCULAR | Status: AC
Start: 2023-07-05 — End: 2023-07-05
  Administered 2023-07-05: 2.5 mg via SUBCUTANEOUS
  Filled 2023-07-05: qty 1

## 2023-07-05 NOTE — Patient Instructions (Signed)
[place] [facility] [place] [facility]  Discharge Instructions: Thank you for choosing [facility] to provide your oncology and hematology care.  If you have a lab appointment with the Cancer Center, please go directly to the Cancer Center and check in at the registration area.  Wear comfortable clothing and clothing appropriate for easy access to any Portacath or PICC line.   We strive to give you quality time with your provider. You may need to reschedule your appointment if you arrive late (15 or more minutes).  Arriving late affects you and other patients whose appointments are after yours.  Also, if you miss three or more appointments without notifying the office, you may be dismissed from the clinic at the provider's discretion.      For prescription refill requests, have your pharmacy contact our office and allow 72 hours for refills to be completed.    Today you received the following chemotherapy and/or immunotherapy agents Velcade.      To help prevent nausea and vomiting after your treatment, we encourage you to take your nausea medication as directed.  BELOW ARE SYMPTOMS THAT SHOULD BE REPORTED IMMEDIATELY: *FEVER GREATER THAN 100.4 F (38 C) OR HIGHER *CHILLS OR SWEATING *NAUSEA AND VOMITING THAT IS NOT CONTROLLED WITH YOUR NAUSEA MEDICATION *UNUSUAL SHORTNESS OF BREATH *UNUSUAL BRUISING OR BLEEDING *URINARY PROBLEMS (pain or burning when urinating, or frequent urination) *BOWEL PROBLEMS (unusual diarrhea, constipation, pain near the anus) TENDERNESS IN MOUTH AND THROAT WITH OR WITHOUT PRESENCE OF ULCERS (sore throat, sores in mouth, or a toothache) UNUSUAL RASH, SWELLING OR PAIN  UNUSUAL VAGINAL DISCHARGE OR ITCHING   Items with * indicate a potential emergency and should be followed up as soon as possible or go to the Emergency Department if any problems should occur.  Please show the CHEMOTHERAPY ALERT CARD or IMMUNOTHERAPY ALERT CARD at check-in to  the Emergency Department and triage nurse.  Should you have questions after your visit or need to cancel or reschedule your appointment, please contact  [facility] [place] [facility]  [phone] and follow the prompts.  Office hours are 8:00 a.m. to 4:30 p.m. Monday - Friday. Please note that voicemails left after 4:00 p.m. may not be returned until the following business day.  We are closed weekends and major holidays. You have access to a nurse at all times for urgent questions. Please call the main number to the clinic [phone] and follow the prompts.  For any non-urgent questions, you may also contact your provider using MyChart. We now offer e-Visits for anyone 18 and older to request care online for non-urgent symptoms. For details visit [email or web address].   Also download the MyChart app! Go to the app store, search "MyChart", open the app, select , and log in with your MyChart username and password.    

## 2023-07-06 LAB — MULTIPLE MYELOMA PANEL, SERUM
Albumin SerPl Elph-Mcnc: 3.8 g/dL (ref 2.9–4.4)
Albumin/Glob SerPl: 1.2 (ref 0.7–1.7)
Alpha 1: 0.2 g/dL (ref 0.0–0.4)
Alpha2 Glob SerPl Elph-Mcnc: 0.8 g/dL (ref 0.4–1.0)
B-Globulin SerPl Elph-Mcnc: 0.8 g/dL (ref 0.7–1.3)
Gamma Glob SerPl Elph-Mcnc: 1.5 g/dL (ref 0.4–1.8)
Globulin, Total: 3.2 g/dL (ref 2.2–3.9)
IgA: 1853 mg/dL — ABNORMAL HIGH (ref 61–437)
IgG (Immunoglobin G), Serum: 281 mg/dL — ABNORMAL LOW (ref 603–1613)
IgM (Immunoglobulin M), Srm: 7 mg/dL — ABNORMAL LOW (ref 20–172)
M Protein SerPl Elph-Mcnc: 1.2 g/dL — ABNORMAL HIGH
Total Protein ELP: 7 g/dL (ref 6.0–8.5)

## 2023-07-08 ENCOUNTER — Ambulatory Visit: Payer: Medicare HMO | Admitting: Oncology

## 2023-07-08 ENCOUNTER — Ambulatory Visit: Payer: Medicare HMO

## 2023-07-08 ENCOUNTER — Other Ambulatory Visit: Payer: Medicare HMO

## 2023-07-08 ENCOUNTER — Inpatient Hospital Stay: Payer: Medicare HMO

## 2023-07-08 VITALS — BP 131/81 | HR 98 | Temp 97.0°F | Resp 18 | Wt 176.0 lb

## 2023-07-08 DIAGNOSIS — Z5112 Encounter for antineoplastic immunotherapy: Secondary | ICD-10-CM | POA: Diagnosis not present

## 2023-07-08 DIAGNOSIS — C9000 Multiple myeloma not having achieved remission: Secondary | ICD-10-CM

## 2023-07-08 LAB — CBC WITH DIFFERENTIAL (CANCER CENTER ONLY)
Abs Immature Granulocytes: 0.05 10*3/uL (ref 0.00–0.07)
Basophils Absolute: 0 10*3/uL (ref 0.0–0.1)
Basophils Relative: 1 %
Eosinophils Absolute: 0.4 10*3/uL (ref 0.0–0.5)
Eosinophils Relative: 5 %
HCT: 34.5 % — ABNORMAL LOW (ref 39.0–52.0)
Hemoglobin: 11.5 g/dL — ABNORMAL LOW (ref 13.0–17.0)
Immature Granulocytes: 1 %
Lymphocytes Relative: 5 %
Lymphs Abs: 0.4 10*3/uL — ABNORMAL LOW (ref 0.7–4.0)
MCH: 32.2 pg (ref 26.0–34.0)
MCHC: 33.3 g/dL (ref 30.0–36.0)
MCV: 96.6 fL (ref 80.0–100.0)
Monocytes Absolute: 0.5 10*3/uL (ref 0.1–1.0)
Monocytes Relative: 7 %
Neutro Abs: 6.3 10*3/uL (ref 1.7–7.7)
Neutrophils Relative %: 81 %
Platelet Count: 151 10*3/uL (ref 150–400)
RBC: 3.57 MIL/uL — ABNORMAL LOW (ref 4.22–5.81)
RDW: 15 % (ref 11.5–15.5)
WBC Count: 7.7 10*3/uL (ref 4.0–10.5)
nRBC: 0 % (ref 0.0–0.2)

## 2023-07-08 LAB — CMP (CANCER CENTER ONLY)
ALT: 21 U/L (ref 0–44)
AST: 13 U/L — ABNORMAL LOW (ref 15–41)
Albumin: 3.7 g/dL (ref 3.5–5.0)
Alkaline Phosphatase: 135 U/L — ABNORMAL HIGH (ref 38–126)
Anion gap: 8 (ref 5–15)
BUN: 21 mg/dL (ref 8–23)
CO2: 26 mmol/L (ref 22–32)
Calcium: 8.4 mg/dL — ABNORMAL LOW (ref 8.9–10.3)
Chloride: 100 mmol/L (ref 98–111)
Creatinine: 1.65 mg/dL — ABNORMAL HIGH (ref 0.61–1.24)
GFR, Estimated: 46 mL/min — ABNORMAL LOW (ref >60)
Glucose, Bld: 280 mg/dL — ABNORMAL HIGH (ref 70–99)
Potassium: 4.5 mmol/L (ref 3.5–5.1)
Sodium: 134 mmol/L — ABNORMAL LOW (ref 135–145)
Total Bilirubin: 0.8 mg/dL (ref 0.3–1.2)
Total Protein: 7.1 g/dL (ref 6.5–8.1)

## 2023-07-08 MED ORDER — DIPHENHYDRAMINE HCL 25 MG PO CAPS
50.0000 mg | ORAL_CAPSULE | Freq: Once | ORAL | Status: AC
  Administered 2023-07-08: 50 mg via ORAL
  Filled 2023-07-08: qty 2

## 2023-07-08 MED ORDER — BORTEZOMIB CHEMO SQ INJECTION 3.5 MG (2.5MG/ML)
1.3000 mg/m2 | Freq: Once | INTRAMUSCULAR | Status: AC
  Administered 2023-07-08: 2.5 mg via SUBCUTANEOUS
  Filled 2023-07-08: qty 1

## 2023-07-08 MED ORDER — DARATUMUMAB-HYALURONIDASE-FIHJ 1800-30000 MG-UT/15ML ~~LOC~~ SOLN
1800.0000 mg | Freq: Once | SUBCUTANEOUS | Status: AC
  Administered 2023-07-08: 1800 mg via SUBCUTANEOUS
  Filled 2023-07-08: qty 15

## 2023-07-08 MED ORDER — DEXAMETHASONE 4 MG PO TABS
20.0000 mg | ORAL_TABLET | Freq: Once | ORAL | Status: AC
  Administered 2023-07-08: 20 mg via ORAL
  Filled 2023-07-08: qty 5

## 2023-07-08 MED ORDER — ACETAMINOPHEN 325 MG PO TABS
650.0000 mg | ORAL_TABLET | Freq: Once | ORAL | Status: AC
  Administered 2023-07-08: 650 mg via ORAL
  Filled 2023-07-08: qty 2

## 2023-07-08 NOTE — Patient Instructions (Signed)
[place] [facility]  Discharge Instructions: Thank you for choosing [facility] to provide your oncology and hematology care.  If you have a lab appointment with the Cancer Center, please go directly to the Cancer Center and check in at the registration area.  Wear comfortable clothing and clothing appropriate for easy access to any Portacath or PICC line.   We strive to give you quality time with your provider. You may need to reschedule your appointment if you arrive late (15 or more minutes).  Arriving late affects you and other patients whose appointments are after yours.  Also, if you miss three or more appointments without notifying the office, you may be dismissed from the clinic at the provider's discretion.      For prescription refill requests, have your pharmacy contact our office and allow 72 hours for refills to be completed.    Today you received the following chemotherapy and/or immunotherapy agents : bortezomib / dara   To help prevent nausea and vomiting after your treatment, we encourage you to take your nausea medication as directed.  BELOW ARE SYMPTOMS THAT SHOULD BE REPORTED IMMEDIATELY: *FEVER GREATER THAN 100.4 F (38 C) OR HIGHER *CHILLS OR SWEATING *NAUSEA AND VOMITING THAT IS NOT CONTROLLED WITH YOUR NAUSEA MEDICATION *UNUSUAL SHORTNESS OF BREATH *UNUSUAL BRUISING OR BLEEDING *URINARY PROBLEMS (pain or burning when urinating, or frequent urination) *BOWEL PROBLEMS (unusual diarrhea, constipation, pain near the anus) TENDERNESS IN MOUTH AND THROAT WITH OR WITHOUT PRESENCE OF ULCERS (sore throat, sores in mouth, or a toothache) UNUSUAL RASH, SWELLING OR PAIN  UNUSUAL VAGINAL DISCHARGE OR ITCHING   Items with * indicate a potential emergency and should be followed up as soon as possible or go to the Emergency Department if any problems should occur.  Please show the CHEMOTHERAPY ALERT CARD or IMMUNOTHERAPY ALERT CARD at  check-in to the Emergency Department and triage nurse.  Should you have questions after your visit or need to cancel or reschedule your appointment, please contact [place] [facility]  [phone] and follow the prompts.  Office hours are 8:00 a.m. to 4:30 p.m. Monday - Friday. Please note that voicemails left after 4:00 p.m. may not be returned until the following business day.  We are closed weekends and major holidays. You have access to a nurse at all times for urgent questions. Please call the main number to the clinic [phone] and follow the prompts.  For any non-urgent questions, you may also contact your provider using MyChart. We now offer e-Visits for anyone 18 and older to request care online for non-urgent symptoms. For details visit [email or web address].   Also download the MyChart app! Go to the app store, search "MyChart", open the app, select [facility], and log in with your MyChart username and password.

## 2023-07-11 ENCOUNTER — Inpatient Hospital Stay: Payer: Medicare HMO

## 2023-07-11 ENCOUNTER — Encounter: Payer: Self-pay | Admitting: Oncology

## 2023-07-11 VITALS — BP 124/72 | HR 83 | Temp 96.5°F | Resp 18

## 2023-07-11 DIAGNOSIS — Z5112 Encounter for antineoplastic immunotherapy: Secondary | ICD-10-CM | POA: Diagnosis not present

## 2023-07-11 DIAGNOSIS — C9000 Multiple myeloma not having achieved remission: Secondary | ICD-10-CM

## 2023-07-11 MED ORDER — PROCHLORPERAZINE MALEATE 10 MG PO TABS
10.0000 mg | ORAL_TABLET | Freq: Once | ORAL | Status: AC
  Administered 2023-07-11: 10 mg via ORAL
  Filled 2023-07-11: qty 1

## 2023-07-11 MED ORDER — BORTEZOMIB CHEMO SQ INJECTION 3.5 MG (2.5MG/ML)
1.3000 mg/m2 | Freq: Once | INTRAMUSCULAR | Status: AC
  Administered 2023-07-11: 2.5 mg via SUBCUTANEOUS
  Filled 2023-07-11: qty 1

## 2023-07-11 NOTE — Patient Instructions (Signed)
[facility]  Discharge Instructions: Thank you for choosing [facility] to provide your oncology and hematology care.  If you have a lab appointment with the Cancer Center, please go directly to the Cancer Center and check in at the registration area.  Wear comfortable clothing and clothing appropriate for easy access to any Portacath or PICC line.   We strive to give you quality time with your provider. You may need to reschedule your appointment if you arrive late (15 or more minutes).  Arriving late affects you and other patients whose appointments are after yours.  Also, if you miss three or more appointments without notifying the office, you may be dismissed from the clinic at the provider's discretion.      For prescription refill requests, have your pharmacy contact our office and allow 72 hours for refills to be completed.    Today you received the following chemotherapy and/or immunotherapy agents VELCADE      To help prevent nausea and vomiting after your treatment, we encourage you to take your nausea medication as directed.  BELOW ARE SYMPTOMS THAT SHOULD BE REPORTED IMMEDIATELY: *FEVER GREATER THAN 100.4 F (38 C) OR HIGHER *CHILLS OR SWEATING *NAUSEA AND VOMITING THAT IS NOT CONTROLLED WITH YOUR NAUSEA MEDICATION *UNUSUAL SHORTNESS OF BREATH *UNUSUAL BRUISING OR BLEEDING *URINARY PROBLEMS (pain or burning when urinating, or frequent urination) *BOWEL PROBLEMS (unusual diarrhea, constipation, pain near the anus) TENDERNESS IN MOUTH AND THROAT WITH OR WITHOUT PRESENCE OF ULCERS (sore throat, sores in mouth, or a toothache) UNUSUAL RASH, SWELLING OR PAIN  UNUSUAL VAGINAL DISCHARGE OR ITCHING   Items with * indicate a potential emergency and should be followed up as soon as possible or go to the Emergency Department if any problems should occur.  Please show the CHEMOTHERAPY ALERT CARD or IMMUNOTHERAPY ALERT CARD at check-in to  the Emergency Department and triage nurse.  Should you have questions after your visit or need to cancel or reschedule your appointment, please contact [facility]  [phone] and follow the prompts.  Office hours are 8:00 a.m. to 4:30 p.m. Monday - Friday. Please note that voicemails left after 4:00 p.m. may not be returned until the following business day.  We are closed weekends and major holidays. You have access to a nurse at all times for urgent questions. Please call the main number to the clinic [phone] and follow the prompts.  For any non-urgent questions, you may also contact your provider using MyChart. We now offer e-Visits for anyone 8 and older to request care online for non-urgent symptoms. For details visit [email or web address].   Also download the MyChart app! Go to the app store, search "MyChart", open the app, select [facility], and log in with your MyChart username and password.  Bortezomib Injection What is this medication? BORTEZOMIB (bor TEZ oh mib) treats lymphoma. It may also be used to treat multiple myeloma, a type of bone marrow cancer. It works by blocking a protein that causes cancer cells to grow and multiply. This helps to slow or stop the spread of cancer cells. This medicine may be used for other purposes; ask your health care provider or pharmacist if you have questions. COMMON BRAND NAME(S): Velcade What should I tell my care team before I take this medication? They need to know if you have any of these conditions: Dehydration Diabetes Heart disease Liver disease Tingling of the fingers or toes or other nerve  disorder An unusual or allergic reaction to bortezomib, other medications, foods, dyes, or preservatives If you or your partner are pregnant or trying to get pregnant Breastfeeding How should I use this medication? This medication is injected into a vein or under the skin. It is given by your care team in a  hospital or clinic setting. Talk to your care team about the use of this medication in children. Special care may be needed. Overdosage: If you think you have taken too much of this medicine contact a poison control center or emergency room at once. NOTE: This medicine is only for you. Do not share this medicine with others. What if I miss a dose? Keep appointments for follow-up doses. It is important not to miss your dose. Call your care team if you are unable to keep an appointment. What may interact with this medication? Ketoconazole Rifampin This list may not describe all possible interactions. Give your health care provider a list of all the medicines, herbs, non-prescription drugs, or dietary supplements you use. Also tell them if you smoke, drink alcohol, or use illegal drugs. Some items may interact with your medicine. What should I watch for while using this medication? Your condition will be monitored carefully while you are receiving this medication. You may need blood work while taking this medication. This medication may affect your coordination, reaction time, or judgment. Do not drive or operate machinery until you know how this medication affects you. Sit up or stand slowly to reduce the risk of dizzy or fainting spells. Drinking alcohol with this medication can increase the risk of these side effects. This medication may increase your risk of getting an infection. Call your care team for advice if you get a fever, chills, sore throat, or other symptoms of a cold or flu. Do not treat yourself. Try to avoid being around people who are sick. Check with your care team if you have severe diarrhea, nausea, and vomiting, or if you sweat a lot. The loss of too much body fluid may make it dangerous for you to take this medication. Talk to your care team if you may be pregnant. Serious birth defects can occur if you take this medication during pregnancy and for 7 months after the last dose. You  will need a negative pregnancy test before starting this medication. Contraception is recommended while taking this medication and for 7 months after the last dose. Your care team can help you find the option that works for you. If your partner can get pregnant, use a condom during sex while taking this medication and for 4 months after the last dose. Do not breastfeed while taking this medication and for 2 months after the last dose. This medication may cause infertility. Talk to your care team if you are concerned about your fertility. What side effects may I notice from receiving this medication? Side effects that you should report to your care team as soon as possible: Allergic reactions--skin rash, itching, hives, swelling of the face, lips, tongue, or throat Bleeding--bloody or black, tar-like stools, vomiting blood or brown material that looks like coffee grounds, red or dark brown urine, small red or purple spots on skin, unusual bruising or bleeding Bleeding in the brain--severe headache, stiff neck, confusion, dizziness, change in vision, numbness or weakness of the face, arm, or leg, trouble speaking, trouble walking, vomiting Bowel blockage--stomach cramping, unable to have a bowel movement or pass gas, loss of appetite, vomiting Heart failure--shortness of breath, swelling of  the ankles, feet, or hands, sudden weight gain, unusual weakness or fatigue Infection--fever, chills, cough, sore throat, wounds that don't heal, pain or trouble when passing urine, general feeling of discomfort or being unwell Liver injury--right upper belly pain, loss of appetite, nausea, light-colored stool, dark yellow or brown urine, yellowing skin or eyes, unusual weakness or fatigue Low blood pressure--dizziness, feeling faint or lightheaded, blurry vision Lung injury--shortness of breath or trouble breathing, cough, spitting up blood, chest pain, fever Pain, tingling, or numbness in the hands or feet Severe  or prolonged diarrhea Stomach pain, bloody diarrhea, pale skin, unusual weakness or fatigue, decrease in the amount of urine, which may be signs of hemolytic uremic syndrome Sudden and severe headache, confusion, change in vision, seizures, which may be signs of posterior reversible encephalopathy syndrome (PRES) TTP--purple spots on the skin or inside the mouth, pale skin, yellowing skin or eyes, unusual weakness or fatigue, fever, fast or irregular heartbeat, confusion, change in vision, trouble speaking, trouble walking Tumor lysis syndrome (TLS)--nausea, vomiting, diarrhea, decrease in the amount of urine, dark urine, unusual weakness or fatigue, confusion, muscle pain or cramps, fast or irregular heartbeat, joint pain Side effects that usually do not require medical attention (report to your care team if they continue or are bothersome): Constipation Diarrhea Fatigue Loss of appetite Nausea This list may not describe all possible side effects. Call your doctor for medical advice about side effects. You may report side effects to FDA at 1-800-FDA-1088. Where should I keep my medication? This medication is given in a hospital or clinic. It will not be stored at home. NOTE: This sheet is a summary. It may not cover all possible information. If you have questions about this medicine, talk to your doctor, pharmacist, or health care provider.  2024 Elsevier/Gold Standard (2022-02-02 00:00:00)

## 2023-07-12 ENCOUNTER — Telehealth: Payer: Self-pay | Admitting: *Deleted

## 2023-07-12 NOTE — Telephone Encounter (Signed)
Call from [place] with Dr Carleene Mains office at [facility] stating that patient needs a cardiac MRI done before his transplant and [facility] is scheduled out until after January so asking if we can get one done here to evaluate for Grade 2 Diastolic dysfunction and r/o Cardiac Amyloidosis. She said she will be sending their notes to [place] soon

## 2023-07-13 ENCOUNTER — Telehealth: Payer: Self-pay

## 2023-07-13 ENCOUNTER — Encounter: Payer: Self-pay | Admitting: Oncology

## 2023-07-13 ENCOUNTER — Other Ambulatory Visit: Payer: Self-pay

## 2023-07-13 DIAGNOSIS — C9000 Multiple myeloma not having achieved remission: Secondary | ICD-10-CM

## 2023-07-13 NOTE — Telephone Encounter (Signed)
I was added to a secure chart by MD    [name], MD Dr. Azucena Cecil, hope you are well. I referred this pt to you for diastolic dysfunction and he has had an appt with you on 11/22. he has multiple myeloma and has been seen by [facility] oncology for possible BM transplant in the future. [facility] would like to get cardiac MRI to rule out amyloidosis, however MRI schedule is fully booked until Jan. [facility] would like [place] to order one. would you please guide me what order should I place?   Tue 4:03 PM BA Debbe Odea, MD hi Dr. Cathie Hoops. Thanks for the referral. my office will place cardiac MRI order. Thank you   4 mins You were added by Debbe Odea, MD.  Debbe Odea, MD [place], please place CMR order for above patient. Dx: multiple myeloma, evaluate for infiltrative disease/amyloidosis. Thanks  3 mins Coralee Rud, RN was added by [name], MD.  [name], MD Thank you    Orders been placed.

## 2023-07-13 NOTE — Telephone Encounter (Signed)
Dr. Cathie Hoops has communicated with pt's cardiologist Dr. Azucena Cecil and he will place order for cardiac MRI. Pt informed that he should expect a call from cardiology office with this appt.

## 2023-07-14 ENCOUNTER — Other Ambulatory Visit: Payer: Self-pay

## 2023-07-14 ENCOUNTER — Emergency Department: Payer: Medicare HMO

## 2023-07-14 ENCOUNTER — Encounter: Payer: Self-pay | Admitting: Radiology

## 2023-07-14 ENCOUNTER — Inpatient Hospital Stay
Admission: EM | Admit: 2023-07-14 | Discharge: 2023-07-19 | DRG: 389 | Disposition: A | Discharge status: Home / Self Care | Payer: Medicare HMO | Attending: Internal Medicine | Admitting: Internal Medicine

## 2023-07-14 ENCOUNTER — Inpatient Hospital Stay: Payer: Medicare HMO

## 2023-07-14 ENCOUNTER — Telehealth: Payer: Self-pay | Admitting: *Deleted

## 2023-07-14 DIAGNOSIS — E871 Hypo-osmolality and hyponatremia: Secondary | ICD-10-CM | POA: Diagnosis present

## 2023-07-14 DIAGNOSIS — E7800 Pure hypercholesterolemia, unspecified: Secondary | ICD-10-CM | POA: Diagnosis present

## 2023-07-14 DIAGNOSIS — I129 Hypertensive chronic kidney disease with stage 1 through stage 4 chronic kidney disease, or unspecified chronic kidney disease: Secondary | ICD-10-CM | POA: Diagnosis present

## 2023-07-14 DIAGNOSIS — I480 Paroxysmal atrial fibrillation: Secondary | ICD-10-CM | POA: Diagnosis present

## 2023-07-14 DIAGNOSIS — Z833 Family history of diabetes mellitus: Secondary | ICD-10-CM | POA: Diagnosis not present

## 2023-07-14 DIAGNOSIS — K560 Paralytic ileus: Principal | ICD-10-CM | POA: Diagnosis present

## 2023-07-14 DIAGNOSIS — E1122 Type 2 diabetes mellitus with diabetic chronic kidney disease: Secondary | ICD-10-CM | POA: Diagnosis present

## 2023-07-14 DIAGNOSIS — Z8249 Family history of ischemic heart disease and other diseases of the circulatory system: Secondary | ICD-10-CM | POA: Diagnosis not present

## 2023-07-14 DIAGNOSIS — N182 Chronic kidney disease, stage 2 (mild): Secondary | ICD-10-CM | POA: Diagnosis present

## 2023-07-14 DIAGNOSIS — N179 Acute kidney failure, unspecified: Secondary | ICD-10-CM | POA: Diagnosis present

## 2023-07-14 DIAGNOSIS — E8809 Other disorders of plasma-protein metabolism, not elsewhere classified: Secondary | ICD-10-CM | POA: Diagnosis present

## 2023-07-14 DIAGNOSIS — Z79899 Other long term (current) drug therapy: Secondary | ICD-10-CM | POA: Diagnosis not present

## 2023-07-14 DIAGNOSIS — Z7984 Long term (current) use of oral hypoglycemic drugs: Secondary | ICD-10-CM

## 2023-07-14 DIAGNOSIS — D696 Thrombocytopenia, unspecified: Secondary | ICD-10-CM | POA: Diagnosis present

## 2023-07-14 DIAGNOSIS — I4891 Unspecified atrial fibrillation: Secondary | ICD-10-CM | POA: Diagnosis present

## 2023-07-14 DIAGNOSIS — C9000 Multiple myeloma not having achieved remission: Secondary | ICD-10-CM | POA: Diagnosis present

## 2023-07-14 DIAGNOSIS — E119 Type 2 diabetes mellitus without complications: Secondary | ICD-10-CM

## 2023-07-14 DIAGNOSIS — Z5986 Financial insecurity: Secondary | ICD-10-CM

## 2023-07-14 DIAGNOSIS — K567 Ileus, unspecified: Secondary | ICD-10-CM

## 2023-07-14 DIAGNOSIS — Z5941 Food insecurity: Secondary | ICD-10-CM | POA: Diagnosis not present

## 2023-07-14 DIAGNOSIS — Z7901 Long term (current) use of anticoagulants: Secondary | ICD-10-CM

## 2023-07-14 LAB — CBC WITH DIFFERENTIAL/PLATELET
Abs Immature Granulocytes: 0.06 10*3/uL (ref 0.00–0.07)
Basophils Absolute: 0 10*3/uL (ref 0.0–0.1)
Basophils Relative: 0 %
Eosinophils Absolute: 0.1 10*3/uL (ref 0.0–0.5)
Eosinophils Relative: 2 %
HCT: 32.7 % — ABNORMAL LOW (ref 39.0–52.0)
Hemoglobin: 11.3 g/dL — ABNORMAL LOW (ref 13.0–17.0)
Immature Granulocytes: 1 %
Lymphocytes Relative: 0 %
Lymphs Abs: 0 10*3/uL — ABNORMAL LOW (ref 0.7–4.0)
MCH: 32.8 pg (ref 26.0–34.0)
MCHC: 34.6 g/dL (ref 30.0–36.0)
MCV: 95.1 fL (ref 80.0–100.0)
Monocytes Absolute: 0.4 10*3/uL (ref 0.1–1.0)
Monocytes Relative: 4 %
Neutro Abs: 8.8 10*3/uL — ABNORMAL HIGH (ref 1.7–7.7)
Neutrophils Relative %: 93 %
Platelets: 78 10*3/uL — ABNORMAL LOW (ref 150–400)
RBC: 3.44 MIL/uL — ABNORMAL LOW (ref 4.22–5.81)
RDW: 14.7 % (ref 11.5–15.5)
WBC: 9.4 10*3/uL (ref 4.0–10.5)
nRBC: 0.3 % — ABNORMAL HIGH (ref 0.0–0.2)

## 2023-07-14 LAB — COMPREHENSIVE METABOLIC PANEL
ALT: 17 U/L (ref 0–44)
AST: 20 U/L (ref 15–41)
Albumin: 1.9 g/dL — ABNORMAL LOW (ref 3.5–5.0)
Alkaline Phosphatase: 41 U/L (ref 38–126)
Anion gap: 15 (ref 5–15)
BUN: 29 mg/dL — ABNORMAL HIGH (ref 8–23)
CO2: 14 mmol/L — ABNORMAL LOW (ref 22–32)
Calcium: 6.4 mg/dL — CL (ref 8.9–10.3)
Chloride: 103 mmol/L (ref 98–111)
Creatinine, Ser: 0.89 mg/dL (ref 0.61–1.24)
GFR, Estimated: >60 mL/min (ref >60)
Glucose, Bld: 138 mg/dL — ABNORMAL HIGH (ref 70–99)
Potassium: 3.6 mmol/L (ref 3.5–5.1)
Sodium: 132 mmol/L — ABNORMAL LOW (ref 135–145)
Total Bilirubin: 0.6 mg/dL (ref 0.3–1.2)
Total Protein: 3.7 g/dL — ABNORMAL LOW (ref 6.5–8.1)

## 2023-07-14 LAB — GLUCOSE, CAPILLARY
Glucose-Capillary: 219 mg/dL — ABNORMAL HIGH (ref 70–99)
Glucose-Capillary: 109 mg/dL — ABNORMAL HIGH (ref 70–99)

## 2023-07-14 LAB — MAGNESIUM: Magnesium: 1.3 mg/dL — ABNORMAL LOW (ref 1.7–2.4)

## 2023-07-14 LAB — LACTIC ACID, PLASMA
Lactic Acid, Venous: 4.1 mmol/L (ref 0.5–1.9)
Lactic Acid, Venous: 6.7 mmol/L (ref 0.5–1.9)
Lactic Acid, Venous: 1.3 mmol/L (ref 0.5–1.9)
Lactic Acid, Venous: 1.5 mmol/L (ref 0.5–1.9)

## 2023-07-14 LAB — PROTIME-INR
INR: 1.3 — ABNORMAL HIGH (ref 0.8–1.2)
Prothrombin Time: 16 s — ABNORMAL HIGH (ref 11.4–15.2)

## 2023-07-14 LAB — APTT: aPTT: 26 s (ref 24–36)

## 2023-07-14 LAB — LIPASE, BLOOD: Lipase: 21 U/L (ref 11–51)

## 2023-07-14 MED ORDER — MAGNESIUM SULFATE 2 GM/50ML IV SOLN
2.0000 g | INTRAVENOUS | Status: AC
  Administered 2023-07-14: 2 g via INTRAVENOUS
  Filled 2023-07-14: qty 50

## 2023-07-14 MED ORDER — SODIUM CHLORIDE 0.9 % IV SOLN: 12.5000 mg | Freq: Three times a day (TID) | INTRAVENOUS | Status: AC | PRN

## 2023-07-14 MED ORDER — INSULIN ASPART 100 UNIT/ML IJ SOLN
0.0000 [IU] | Freq: Every day | INTRAMUSCULAR | Status: DC
Start: 2023-07-14 — End: 2023-07-19

## 2023-07-14 MED ORDER — ONDANSETRON HCL 4 MG/2ML IJ SOLN
4.0000 mg | Freq: Four times a day (QID) | INTRAMUSCULAR | Status: DC | PRN
  Administered 2023-07-14: 4 mg via INTRAVENOUS
  Filled 2023-07-14: qty 2

## 2023-07-14 MED ORDER — MORPHINE SULFATE (PF) 2 MG/ML IV SOLN: 2.0000 mg | INTRAVENOUS | Status: AC | PRN

## 2023-07-14 MED ORDER — CALCIUM GLUCONATE-NACL 2-0.675 GM/100ML-% IV SOLN
2.0000 g | Freq: Once | INTRAVENOUS | Status: AC
  Administered 2023-07-14: 2000 mg via INTRAVENOUS
  Filled 2023-07-14: qty 100

## 2023-07-14 MED ORDER — IOHEXOL 300 MG/ML  SOLN
100.0000 mL | Freq: Once | INTRAMUSCULAR | Status: AC | PRN
  Administered 2023-07-14: 100 mL via INTRAVENOUS

## 2023-07-14 MED ORDER — LACTATED RINGERS IV SOLN: INTRAVENOUS | Status: DC

## 2023-07-14 MED ORDER — ONDANSETRON HCL 4 MG PO TABS: 4.0000 mg | ORAL_TABLET | Freq: Four times a day (QID) | ORAL | Status: DC | PRN

## 2023-07-14 MED ORDER — SODIUM CHLORIDE 0.9 % IV BOLUS
1000.0000 mL | Freq: Once | INTRAVENOUS | Status: AC
  Administered 2023-07-14: 1000 mL via INTRAVENOUS

## 2023-07-14 MED ORDER — INSULIN ASPART 100 UNIT/ML IJ SOLN
0.0000 [IU] | Freq: Three times a day (TID) | INTRAMUSCULAR | Status: DC
  Administered 2023-07-14: 5 [IU] via SUBCUTANEOUS
  Administered 2023-07-18 – 2023-07-19 (×2): 2 [IU] via SUBCUTANEOUS
  Filled 2023-07-14 (×4): qty 1

## 2023-07-14 MED ORDER — ACETAMINOPHEN 650 MG RE SUPP: 650.0000 mg | Freq: Four times a day (QID) | RECTAL | Status: DC | PRN

## 2023-07-14 MED ORDER — OXYMETAZOLINE HCL 0.05 % NA SOLN
1.0000 | Freq: Once | NASAL | Status: DC
  Filled 2023-07-14: qty 30

## 2023-07-14 MED ORDER — LIDOCAINE HCL URETHRAL/MUCOSAL 2 % EX GEL
1.0000 | Freq: Once | CUTANEOUS | Status: DC
  Filled 2023-07-14: qty 10

## 2023-07-14 MED ORDER — ACETAMINOPHEN 325 MG PO TABS: 650.0000 mg | ORAL_TABLET | Freq: Four times a day (QID) | ORAL | Status: DC | PRN

## 2023-07-14 MED ORDER — LACTATED RINGERS IV BOLUS
2000.0000 mL | Freq: Once | INTRAVENOUS | Status: AC
Start: 2023-07-14 — End: 2023-07-14
  Administered 2023-07-14: 2000 mL via INTRAVENOUS

## 2023-07-14 NOTE — H&P (Signed)
History and Physical   WOODFORD SAULTER AOZ:[id number] DOB: November 11, 1955 DOA: 07/14/2023  PCP: Danella Penton, MD  Patient coming from: Intractable nausea and vomiting  I have personally briefly reviewed patient's old medical records in [facility] EMR.  Chief Concern: Nausea and vomiting  HPI: Mr. Victor Phillips is a 67 year old male with non-insulin-dependent diabetes mellitus, hypertension, hyperlipidemia, history of multiple myeloma, atrial fibrillation on Eliquis, who presents to the emergency department for chief concerns of intractable nausea and vomiting.  Vitals in the ED showed temperature of 98, respiration rate 25, heart rate of 116, blood pressure 112/74, SpO2 of 97% on 2 L nasal cannula.  Serum sodium is 132, potassium 3.6, chloride 103, bicarb 14, BUN of 29, serum creatinine 0.89, EGFR greater than 60, nonfasting blood glucose 138, WBC 9.4, hemoglobin 11.3, platelets of 78.  Lactic acid was initially 4.1 and on repeat was 6.7.  ED treatment: magnesium 2 g IV one-time dose, sodium chloride 1 L bolus, LR 2 L bolus, calcium gluconate 2 g IV.  EDP discussed with general surgery, who reviewed CT imaging and recommends conservative management at this time.  Can place NG tube if the patient has recurrent intractable nausea and vomiting. ------------------------ At bedside, patient was able to tell me his name, age, location, current calendar year.  He reports generalized abdominal pain, discomfort, with nausea and vomiting that started about 2 to 3 days ago.  He reports he is never felt this way before.  He reports he has vomited so much to count.  He denies any blood in his vomitus.  He last had a good bowel movements yesterday and he reports that the stool was brown and negative for blood or black.  He denies history of intra-abdominal surgery.  He reports he had a colonoscopy last year and was told it was normal.  Social history: He lives at home with his wife.  He denies EtOH,  and recreational drug use.  He dips tobacco.  He is retired.  ROS: Constitutional: no weight change, no fever ENT/Mouth: no sore throat, no rhinorrhea Eyes: no eye pain, no vision changes Cardiovascular: no chest pain, no dyspnea,  no edema, no palpitations Respiratory: no cough, no sputum, no wheezing Gastrointestinal: + nausea, + vomiting, no diarrhea, no constipation Genitourinary: no urinary incontinence, no dysuria, no hematuria Musculoskeletal: no arthralgias, no myalgias Skin: no skin lesions, no pruritus, Neuro: + weakness, no loss of consciousness, no syncope Psych: no anxiety, no depression, + decrease appetite Heme/Lymph: no bruising, no bleeding  ED Course: Discussed with emergency medicine provider, patient requiring hospitalization for chief concerns of abdominal ileus.  Assessment/Plan  Principal Problem:   Ileus (HCC) Active Problems:   Type 2 diabetes mellitus (HCC)   Hypercholesteremia   Multiple myeloma (HCC)   Atrial fibrillation (HCC)   Thrombocytopenia (HCC)   Hypomagnesemia   Hypocalcemia   Assessment and Plan:  * Ileus (HCC) Conservative management: As needed ondansetron IV every 6 hours.  For nausea, vomiting; Phenergan 12.5 mg IV every 8 hours as needed for refractory nausea and vomiting, 1 day ordered Morphine 2 mg IV every 4 hours as needed for moderate and severe pain, 20 hours of coverage ordered Symptomatic support: Status post sodium chloride 1 L bolus, LR 2 L bolus.  Continue with LR infusion at 150 mL/h  Hypocalcemia In setting of hypoalbuminemia Corrected serum calcium is 8.1 Status post calcium gluconate 2 g IV one-time dose per EDP Check CMP in a.m.  Hypomagnesemia Suspect secondary to  intractable nausea and vomiting Status post magnesium 2 g IV one-time dose ordered by EDP Recheck magnesium level in the a.m.  Thrombocytopenia (HCC) Platelets 78 on admission; Check CBC in the a.m.  Atrial fibrillation (HCC) Home Eliquis not  resumed on admission as patient has ileus/small bowel obstruction AM team to resume when the benefits outweigh the risk  Multiple myeloma (HCC) Continue output patient follow-up with hematology/oncology  Hypercholesteremia Home atorvastatin not resumed on admission as patient has ileus, continue bowel rest  Type 2 diabetes mellitus (HCC) Home glipizide not resumed on admission Insulin SSI with at bedtime coverage ordered  Chart reviewed.   DVT prophylaxis: AM team to resume when the benefits outweigh the risk; Eliquis on hold due to ileus and heparin not initiated as patient has thrombocytopenia Code Status: Full code Diet: N.p.o.; with orders to advance as tolerated to clear liquid Family Communication: No Disposition Plan: Pending clinical course Consults called: General Surgery per EDP Admission status: Telemetry medical, inpatient  Past Medical History:  Diagnosis Date   Diabetes mellitus without complication (HCC)    Hypertension    Past Surgical History:  Procedure Laterality Date   COLONOSCOPY N/A 02/13/2023   Procedure: COLONOSCOPY;  Surgeon: Toledo, Boykin Nearing, MD;  Location: [facility] ENDOSCOPY;  Service: Gastroenterology;  Laterality: N/A;   COLONOSCOPY N/A 02/14/2023   Procedure: COLONOSCOPY;  Surgeon: Toledo, Boykin Nearing, MD;  Location: [facility] ENDOSCOPY;  Service: Gastroenterology;  Laterality: N/A;   ESOPHAGOGASTRODUODENOSCOPY N/A 02/13/2023   Procedure: ESOPHAGOGASTRODUODENOSCOPY (EGD);  Surgeon: Toledo, Boykin Nearing, MD;  Location: [facility] ENDOSCOPY;  Service: Gastroenterology;  Laterality: N/A;   Social History:  reports that he has never smoked. He has never used smokeless tobacco. He reports that he does not currently use alcohol. He reports that he does not use drugs.  No Known Allergies Family History  Problem Relation Age of Onset   Diabetes Mother    Heart attack Father    Family history: Family history reviewed and not pertinent.  Prior to Admission medications    Medication Sig Start Date End Date Taking? Authorizing Provider  acetaminophen (TYLENOL) 325 MG tablet Take 2 tablets (650 mg total) by mouth every 6 (six) hours as needed for mild pain (or Fever >/= 101). 02/14/23   Arnetha Courser, MD  acyclovir (ZOVIRAX) 400 MG tablet Take 1 tablet (400 mg total) by mouth 2 (two) times daily. 04/14/23   [name], MD  apixaban (ELIQUIS) 2.5 MG TABS tablet Take 1 tablet (2.5 mg total) by mouth 2 (two) times daily. 06/03/23   [name], MD  atorvastatin (LIPITOR) 10 MG tablet Take 1 tablet by mouth daily. 11/23/18   [provider]  calcium carbonate (OS-CAL) 600 MG TABS tablet Take 2 tablets (1,200 mg total) by mouth daily. 05/20/23   [name], MD  cholecalciferol (VITAMIN D3) 25 MCG (1000 UNIT) tablet Take 1 tablet (1,000 Units total) by mouth daily. 05/20/23   [name], MD  diltiazem (CARDIZEM CD) 120 MG 24 hr capsule Take 1 capsule (120 mg total) by mouth daily. 05/28/23   Trinna Post, MD  furosemide (LASIX) 20 MG tablet Take 1 tablet (20 mg total) by mouth daily as needed for edema or fluid. 06/03/23   [name], MD  glipiZIDE (GLUCOTROL XL) 2.5 MG 24 hr tablet Take by mouth. 04/14/23 04/13/24  [provider]  lenalidomide (REVLIMID) 10 MG capsule Take 1 capsule (10 mg total) by mouth daily. Take for 14 days, then hold for 7 days off. Repeat every  21 days. 06/17/23   [name], MD  Multiple Vitamin (MULTIVITAMIN WITH MINERALS) TABS tablet Take 1 tablet by mouth daily. 02/15/23   Arnetha Courser, MD  olmesartan-hydrochlorothiazide (BENICAR HCT) 20-12.5 MG tablet Take 1 tablet by mouth daily. 02/03/23 02/03/24  [provider]  ondansetron (ZOFRAN) 8 MG tablet Take 1 tablet (8 mg total) by mouth every 8 (eight) hours as needed for nausea or vomiting. 04/14/23   [name], MD  prochlorperazine (COMPAZINE) 10 MG tablet Take 1 tablet (10 mg total) by mouth every 6 (six) hours as needed for nausea or vomiting. 04/14/23   [name], MD  senna (SENOKOT) 8.6 MG TABS tablet Take  2 tablets (17.2 mg total) by mouth daily. 04/29/23   [name], MD  temazepam (RESTORIL) 15 MG capsule Take by mouth. 05/05/23 11/01/23  [provider]   Physical Exam: Vitals:   07/14/23 1430 07/14/23 1445 07/14/23 1500 07/14/23 1545  BP: 126/84   (!) 145/76  Pulse: (!) 105 (!) 105  (!) 108  Resp: 18 20 (!) 23 19  Temp:    97.8 F (36.6 C)  TempSrc:    Oral  SpO2: 100% 100% 100% 100%  Weight:      Height:       Constitutional: appears age-appropriate, NAD, calm Eyes: PERRL, lids and conjunctivae normal ENMT: Mucous membranes are moist. Posterior pharynx clear of any exudate or lesions. Age-appropriate dentition. Hearing appropriate Neck: normal, supple, no masses, no thyromegaly Respiratory: clear to auscultation bilaterally, no wheezing, no crackles. Normal respiratory effort. No accessory muscle use.  Cardiovascular: Regular rate and rhythm, no murmurs / rubs / gallops. No extremity edema. 2+ pedal pulses. No carotid bruits.  Abdomen: Distended abdomen,+ diffuse tenderness, no masses palpated, no hepatosplenomegaly. Bowel sounds positive.  Musculoskeletal: no clubbing / cyanosis. No joint deformity upper and lower extremities. Good ROM, no contractures, no atrophy. Normal muscle tone.  Skin: no rashes, lesions, ulcers. No induration Neurologic: Sensation intact. Strength 5/5 in all 4.  Psychiatric: Normal judgment and insight. Alert and oriented x 3. Normal mood.   EKG: Ordered pending completion  Chest x-ray on Admission: I personally reviewed and I agree with radiologist reading as below.  CT ABDOMEN PELVIS W CONTRAST  Result Date: 07/14/2023 CLINICAL DATA:  Bowel obstruction suspected. Nausea and vomiting. Right leg pain. EXAM: CT ABDOMEN AND PELVIS WITH CONTRAST TECHNIQUE: Multidetector CT imaging of the abdomen and pelvis was performed using the standard protocol following bolus administration of intravenous contrast. RADIATION DOSE REDUCTION: This exam was performed  according to the departmental dose-optimization program which includes automated exposure control, adjustment of the mA and/or kV according to patient size and/or use of iterative reconstruction technique. CONTRAST:  OMNIPAQUE IOHEXOL 300 MG/ML  SOLN COMPARISON:  CT scan abdomen and pelvis from 02/11/2023. FINDINGS: Lower chest: The lung bases are clear. No pleural effusion. The heart is normal in size. No pericardial effusion. Hepatobiliary: The liver is normal in size. Non-cirrhotic configuration. No suspicious mass. These is mild diffuse hepatic steatosis. No intrahepatic or extrahepatic bile duct dilation. No calcified gallstones. Normal gallbladder wall thickness. No pericholecystic inflammatory changes. Pancreas: Unremarkable. No pancreatic ductal dilatation or surrounding inflammatory changes. Spleen: Within normal limits. No focal lesion. Adrenals/Urinary Tract: Adrenal glands are unremarkable. No suspicious renal mass. There are at least 2 subcentimeter sized simple cysts in the right kidney. No hydronephrosis. No renal or ureteric calculi. Unremarkable urinary bladder. Stomach/Bowel: There are multiple loops of small bowel which are dilated measuring up to  3.4 cm in diameter. The gradually taper in the right lower quadrant (series 6, image 33). No point of transition seen. Several distal ileal loops are nondilated. The colon contains moderate amount of stool burden without abnormal dilation. A normal appendix is not seen and may be surgically absent. No inflammatory changes in the right lower quadrant. No abnormal bowel wall thickening or surrounding fat stranding. Vascular/Lymphatic: No ascites or pneumoperitoneum. No abdominal or pelvic lymphadenopathy, by size criteria. No aneurysmal dilation of the major abdominal arteries. There are moderate peripheral atherosclerotic vascular calcifications of the aorta and its major branches. Reproductive: Normal size prostate. Symmetric seminal vesicles.  Other: There are bilateral small fat containing inguinal hernias. The soft tissues and abdominal wall are otherwise unremarkable. Musculoskeletal: No suspicious osseous lesions. There are mild multilevel degenerative changes in the visualized spine. IMPRESSION: *There are disproportionately dilated multiple small bowel loops which gradually tapers in the right lower quadrant without discrete point of transition. Findings favor adynamic ileus. Correlate clinically. Consider continued radiological follow-up to exclude developing small bowel obstruction. No suspicious mass or inflammatory changes noted. No pneumoperitoneum, ascites, pneumatosis or portal venous gas. *Multiple other nonacute observations, as described above. Electronically Signed   By: Jules Schick M.D.   On: 07/14/2023 13:38   CT Head Wo Contrast  Result Date: 07/14/2023 CLINICAL DATA:  Provided history: Transient ischemic attack (TIA). Nausea. Vomiting. EXAM: CT HEAD WITHOUT CONTRAST TECHNIQUE: Contiguous axial images were obtained from the base of the skull through the vertex without intravenous contrast. RADIATION DOSE REDUCTION: This exam was performed according to the departmental dose-optimization program which includes automated exposure control, adjustment of the mA and/or kV according to patient size and/or use of iterative reconstruction technique. COMPARISON:  Head CT 03/11/2023. FINDINGS: Brain: No age advanced or lobar predominant parenchymal atrophy. There is no acute intracranial hemorrhage. No demarcated cortical infarct. No extra-axial fluid collection. No evidence of an intracranial mass. No midline shift. Vascular: No hyperdense vessel.  Atherosclerotic calcifications. Skull: No calvarial fracture or aggressive osseous lesion. Sinuses/Orbits: No mass or acute finding within the imaged orbits. Minimal mucosal thickening within the bilateral ethmoid sinuses. Minimal mucosal thickening, and tiny mucous retention cyst, within the  left sphenoid sinus. IMPRESSION: 1. No evidence of an acute intracranial abnormality. 2. Minor paranasal sinus disease as described. Electronically Signed   By: Jackey Loge D.O.   On: 07/14/2023 12:35   DG Chest Port 1 View  Result Date: 07/14/2023 CLINICAL DATA:  Sepsis.  Nausea and vomiting.  Atrial fibrillation. EXAM: PORTABLE CHEST 1 VIEW COMPARISON:  05/28/2023 FINDINGS: Very low lung volumes limit evaluation. Both lungs are grossly clear. Heart size is within normal limits. Old left clavicle fracture deformity again noted. IMPRESSION: Very low lung volumes limit evaluation.  No acute findings. Electronically Signed   By: Danae Orleans M.D.   On: 07/14/2023 08:40    Labs on Admission: I have personally reviewed following labs CBC: Recent Labs  Lab 07/08/23 0834 07/14/23 0740  WBC 7.7 9.4  NEUTROABS 6.3 8.8*  HGB 11.5* 11.3*  HCT 34.5* 32.7*  MCV 96.6 95.1  PLT 151 78*   Basic Metabolic Panel: Recent Labs  Lab 07/08/23 0834 07/14/23 0956  NA 134* 132*  K 4.5 3.6  CL 100 103  CO2 26 14*  GLUCOSE 280* 138*  BUN 21 29*  CREATININE 1.65* 0.89  CALCIUM 8.4* 6.4*  MG  --  1.3*   GFR: Estimated Creatinine Clearance: 83.8 mL/min (by C-G formula based on SCr of  0.89 mg/dL). Liver Function Tests: Recent Labs  Lab 07/08/23 0834 07/14/23 0956  AST 13* 20  ALT 21 17  ALKPHOS 135* 41  BILITOT 0.8 0.6  PROT 7.1 3.7*  ALBUMIN 3.7 1.9*   Recent Labs  Lab 07/14/23 0956  LIPASE 21   Coagulation Profile: Recent Labs  Lab 07/14/23 0740  INR 1.3*   Urine analysis:    Component Value Date/Time   COLORURINE YELLOW (A) 02/11/2023 0600   APPEARANCEUR CLEAR (A) 02/11/2023 0600   LABSPEC 1.023 02/11/2023 0600   PHURINE 5.0 02/11/2023 0600   GLUCOSEU NEGATIVE 02/11/2023 0600   HGBUR SMALL (A) 02/11/2023 0600   BILIRUBINUR NEGATIVE 02/11/2023 0600   KETONESUR NEGATIVE 02/11/2023 0600   PROTEINUR 100 (A) 02/11/2023 0600   NITRITE NEGATIVE 02/11/2023 0600   LEUKOCYTESUR  NEGATIVE 02/11/2023 0600   This document was prepared using Dragon Voice Recognition software and may include unintentional dictation errors.  Dr. Sedalia Muta [facility]  If 7PM-7AM, please contact overnight-coverage provider If 7AM-7PM, please contact day attending provider [email or web address]  07/14/2023, 7:14 PM

## 2023-07-14 NOTE — Assessment & Plan Note (Signed)
Conservative management: As needed ondansetron IV every 6 hours.  For nausea, vomiting; Phenergan 12.5 mg IV every 8 hours as needed for refractory nausea and vomiting, 1 day ordered Morphine 2 mg IV every 4 hours as needed for moderate and severe pain, 20 hours of coverage ordered Symptomatic support: Status post sodium chloride 1 L bolus, LR 2 L bolus.  Continue with LR infusion at 150 mL/h

## 2023-07-14 NOTE — ED Triage Notes (Signed)
Pt arrived via EMS from home. Complains of nausea and vomiting. No blood noticed per EMS. Pt has a history of A-fib. Denies any chest pain. Pt on Eliquis. Pt also complain of R leg pain. Pt is currently on 2L of O2.

## 2023-07-14 NOTE — Assessment & Plan Note (Signed)
In setting of hypoalbuminemia Corrected serum calcium is 8.1 Status post calcium gluconate 2 g IV one-time dose per EDP Check CMP in a.m.

## 2023-07-14 NOTE — Assessment & Plan Note (Signed)
Home glipizide not resumed on admission Insulin SSI with at bedtime coverage ordered

## 2023-07-14 NOTE — ED Notes (Signed)
Zach GI PA at [facility]. Hold NGT for now. V.O.V. No changes. Al;ert, NAD, calm, interactive. Denies pain or nausea at this time.

## 2023-07-14 NOTE — Assessment & Plan Note (Signed)
Home atorvastatin not resumed on admission as patient has ileus, continue bowel rest

## 2023-07-14 NOTE — ED Provider Notes (Signed)
[facility] Provider Note    Event Date/Time   First MD Initiated Contact with Patient 07/14/23 [id number]     (approximate)   History   Chief Complaint: Emesis   HPI  Victor Phillips is a 67 y.o. male with a history of diabetes, CKD, multiple myeloma, atrial fibrillation, hypertension who comes ED complaining of nausea vomiting and abdominal distention, worsening over the last several days.  Was able to eat up until yesterday, no oral intake this morning.  He is on Eliquis.  No black or bloody stool or hematemesis.     Physical Exam   Triage Vital Signs: ED Triage Vitals  Encounter Vitals Group     BP 07/14/23 0738 128/74     Systolic BP Percentile --      Diastolic BP Percentile --      Pulse Rate 07/14/23 0738 (!) 116     Resp 07/14/23 0738 (!) 33     Temp 07/14/23 0738 98 F (36.7 C)     Temp Source 07/14/23 0738 Oral     SpO2 07/14/23 0738 96 %     Weight 07/14/23 0739 176 lb (79.8 kg)     Height 07/14/23 0739 5\' 7"  (1.702 m)     Head Circumference --      Peak Flow --      Pain Score 07/14/23 0739 0     Pain Loc --      Pain Education --      Exclude from Growth Chart --     Most recent vital signs: Vitals:   07/14/23 1130 07/14/23 1145  BP: 119/75 105/72  Pulse:    Resp: 17 16  Temp:    SpO2: 100% 100%    General: Awake, no distress.  CV:  Good peripheral perfusion.  Irregular rhythm, heart rate 120 Resp:  Normal effort.  Clear to auscultation bilaterally Abd:  Moderately distended with tympany to percussion.  No significant tenderness Other:  Dry oral mucosa.  No lower extremity edema   ED Results / Procedures / Treatments   Labs (all labs ordered are listed, but only abnormal results are displayed) Labs Reviewed  LACTIC ACID, PLASMA - Abnormal; Notable for the following components:      Result Value   Lactic Acid, Venous 4.1 (*)    All other components within normal limits  LACTIC ACID, PLASMA - Abnormal; Notable for  the following components:   Lactic Acid, Venous 6.7 (*)    All other components within normal limits  CBC WITH DIFFERENTIAL/PLATELET - Abnormal; Notable for the following components:   RBC 3.44 (*)    Hemoglobin 11.3 (*)    HCT 32.7 (*)    Platelets 78 (*)    nRBC 0.3 (*)    Neutro Abs 8.8 (*)    Lymphs Abs 0.0 (*)    All other components within normal limits  PROTIME-INR - Abnormal; Notable for the following components:   Prothrombin Time 16.0 (*)    INR 1.3 (*)    All other components within normal limits  COMPREHENSIVE METABOLIC PANEL - Abnormal; Notable for the following components:   Sodium 132 (*)    CO2 14 (*)    Glucose, Bld 138 (*)    BUN 29 (*)    Calcium 6.4 (*)    Total Protein 3.7 (*)    Albumin 1.9 (*)    All other components within normal limits  MAGNESIUM - Abnormal; Notable for the following components:  Magnesium 1.3 (*)    All other components within normal limits  CULTURE, BLOOD (ROUTINE X 2)  CULTURE, BLOOD (ROUTINE X 2)  APTT  LIPASE, BLOOD  URINALYSIS, W/ REFLEX TO CULTURE (INFECTION SUSPECTED)  LACTIC ACID, PLASMA     EKG    RADIOLOGY Chest x-ray interpreted by me, unremarkable.  Radiology report reviewed   PROCEDURES:  Procedures   MEDICATIONS ORDERED IN ED: Medications  lidocaine (XYLOCAINE) 2 % jelly 1 Application (0 Applications Other Hold 07/14/23 1155)  oxymetazoline (AFRIN) 0.05 % nasal spray 1 spray (0 sprays Each Nare Hold 07/14/23 1157)  sodium chloride 0.9 % bolus 1,000 mL (0 mLs Intravenous Stopped 07/14/23 0822)  lactated ringers bolus 2,000 mL (0 mLs Intravenous Stopped 07/14/23 1035)  calcium gluconate 2 g/ 100 mL sodium chloride IVPB (0 mg Intravenous Stopped 07/14/23 1210)  iohexol (OMNIPAQUE) 300 MG/ML solution 100 mL (100 mLs Intravenous Contrast Given 07/14/23 1054)  magnesium sulfate IVPB 2 g 50 mL (0 g Intravenous Stopped 07/14/23 1238)     IMPRESSION / MDM / ASSESSMENT AND PLAN / ED COURSE  I reviewed the  triage vital signs and the nursing notes.  DDx: Bowel obstruction, bowel perforation, pancreatitis, gastritis, dehydration, electrolyte abnormality, AKI, anemia  Patient's presentation is most consistent with acute presentation with potential threat to life or bodily function.  Patient presents with nausea vomiting, abdominal distention, exam concerning for small bowel obstruction.  He does have A-fib with RVR/tachycardia, tachypnea, so sepsis workup initiated with lactate and blood culture although there is no identifiable source at present time.  ----------------------------------------- 9:50 AM on 07/14/2023 ----------------------------------------- Lactate elevated at 4.1.  Additional fluids ordered for minimum 30 mL/kg IV fluid bolus.   Clinical Course as of 07/14/23 1320  Thu Jul 14, 2023  1127 CT a/p interpreted by me, shows SBO with apparent transition point at the distal small bowel in RLQ. No pneumatosis. Will f/u radiology report [PS]  1156 Seen by general surgery who advises NGT can be deferred for now unless pt has more vomiting. Gradual transition in terminal ileum on CT. Bowel rest, IVF, e- correction.  [PS]    Clinical Course User Index [PS] Sharman Cheek, MD   ----------------------------------------- 1:20 PM on 07/14/2023 ----------------------------------------- D/w hospitalist   FINAL CLINICAL IMPRESSION(S) / ED DIAGNOSES   Final diagnoses:  Ileus (HCC)  Hypocalcemia  Hypomagnesemia     Rx / DC Orders   ED Discharge Orders     None        Note:  This document was prepared using Dragon voice recognition software and may include unintentional dictation errors.   Sharman Cheek, MD 07/14/23 1320

## 2023-07-14 NOTE — Consult Note (Signed)
[facility] SURGICAL CONSULTATION NOTE (initial) - cpt: 16109   HISTORY OF PRESENT ILLNESS (HPI):  67 y.o. male presented to [facility] ED today for evaluation of emesis. Patient reports around a 24 hours history of nausea and emesis. This has been non-bloody and non-bilious. He reports last episode was this morning. No further emesis or nausea. He denied any fever ,chills, cough, CP, SOB, bowel changes, or urinary changes. His last BM was yesterday. Typically goes every few days at his baseline. Of note, he has a history of multiple myeloma plan for treated with Revlimid and daratumunab. Also seen by [facility] with possible bone marrow transplant. He is awaiting cardiac MRI to rule out amyloidosis. Also on Eliquis for PAF.  PSHx negative for intra-abdominal procedures. Work up in the ED revealed a normal WBC at 9.4K, Hgb to 11.3 (stable), sCr - 0.89, hyponatremia to 132, K+ 3.6, hypocalcemia to 6.4 (corrected for hypoalbuminemia to 8.1), albumin 1.9, lactic acid trend in ED 4.1 --> 6.7. He did undergo CT Abdomen/Pelvis which is awaiting read but has dilated loops of small bowel with gradual transition at ileum. Plan for admission to medicine for hypocalcemia.   Surgery is consulted by emergency medicine physician Dr. Sharman Cheek, MD in this context for evaluation and management of small bowel dilation concerning for possible SBO vs ileus.  PAST MEDICAL HISTORY (PMH):  Past Medical History:  Diagnosis Date   Diabetes mellitus without complication (HCC)    Hypertension      PAST SURGICAL HISTORY (PSH):  Past Surgical History:  Procedure Laterality Date   COLONOSCOPY N/A 02/13/2023   Procedure: COLONOSCOPY;  Surgeon: Toledo, Boykin Nearing, MD;  Location: [facility] ENDOSCOPY;  Service: Gastroenterology;  Laterality: N/A;   COLONOSCOPY N/A 02/14/2023   Procedure: COLONOSCOPY;  Surgeon: Toledo, Boykin Nearing, MD;  Location: [facility] ENDOSCOPY;  Service: Gastroenterology;  Laterality: N/A;    ESOPHAGOGASTRODUODENOSCOPY N/A 02/13/2023   Procedure: ESOPHAGOGASTRODUODENOSCOPY (EGD);  Surgeon: Toledo, Boykin Nearing, MD;  Location: [facility] ENDOSCOPY;  Service: Gastroenterology;  Laterality: N/A;     MEDICATIONS:  Prior to Admission medications   Medication Sig Start Date End Date Taking? Authorizing Provider  acetaminophen (TYLENOL) 325 MG tablet Take 2 tablets (650 mg total) by mouth every 6 (six) hours as needed for mild pain (or Fever >/= 101). 02/14/23   Arnetha Courser, MD  acyclovir (ZOVIRAX) 400 MG tablet Take 1 tablet (400 mg total) by mouth 2 (two) times daily. 04/14/23   [name], MD  apixaban (ELIQUIS) 2.5 MG TABS tablet Take 1 tablet (2.5 mg total) by mouth 2 (two) times daily. 06/03/23   [name], MD  atorvastatin (LIPITOR) 10 MG tablet Take 1 tablet by mouth daily. 11/23/18   [provider]  calcium carbonate (OS-CAL) 600 MG TABS tablet Take 2 tablets (1,200 mg total) by mouth daily. 05/20/23   [name], MD  cholecalciferol (VITAMIN D3) 25 MCG (1000 UNIT) tablet Take 1 tablet (1,000 Units total) by mouth daily. 05/20/23   [name], MD  diltiazem (CARDIZEM CD) 120 MG 24 hr capsule Take 1 capsule (120 mg total) by mouth daily. 05/28/23   Trinna Post, MD  furosemide (LASIX) 20 MG tablet Take 1 tablet (20 mg total) by mouth daily as needed for edema or fluid. 06/03/23   [name], MD  glipiZIDE (GLUCOTROL XL) 2.5 MG 24 hr tablet Take by mouth. 04/14/23 04/13/24  [provider]  lenalidomide (REVLIMID) 10 MG capsule Take 1 capsule (10 mg total) by mouth daily. Take  for 14 days, then hold for 7 days off. Repeat every 21 days. 06/17/23   [name], MD  Multiple Vitamin (MULTIVITAMIN WITH MINERALS) TABS tablet Take 1 tablet by mouth daily. 02/15/23   Arnetha Courser, MD  olmesartan-hydrochlorothiazide (BENICAR HCT) 20-12.5 MG tablet Take 1 tablet by mouth daily. 02/03/23 02/03/24  [provider]  ondansetron (ZOFRAN) 8 MG tablet Take 1 tablet (8 mg total) by mouth every 8 (eight) hours as  needed for nausea or vomiting. 04/14/23   [name], MD  prochlorperazine (COMPAZINE) 10 MG tablet Take 1 tablet (10 mg total) by mouth every 6 (six) hours as needed for nausea or vomiting. 04/14/23   [name], MD  senna (SENOKOT) 8.6 MG TABS tablet Take 2 tablets (17.2 mg total) by mouth daily. 04/29/23   [name], MD  temazepam (RESTORIL) 15 MG capsule Take by mouth. 05/05/23 11/01/23  [provider]     ALLERGIES:  No Known Allergies   SOCIAL HISTORY:  Social History   Socioeconomic History   Marital status: Married    Spouse name: Not on file   Number of children: Not on file   Years of education: Not on file   Highest education level: Not on file  Occupational History   Not on file  Tobacco Use   Smoking status: Never   Smokeless tobacco: Never  Vaping Use   Vaping status: Never Used  Substance and Sexual Activity   Alcohol use: Not Currently    Comment: quit approx 2001   Drug use: No   Sexual activity: Not on file  Other Topics Concern   Not on file  Social History Narrative   Not on file   Social Determinants of Health   Financial Resource Strain: High Risk (07/05/2023)   Received from [facility]   Overall Financial Resource Strain (CARDIA)    Difficulty of Paying Living Expenses: Hard  Food Insecurity: Food Insecurity Present (07/05/2023)   Received from [facility]   Hunger Vital Sign    Worried About Running Out of Food in the Last Year: Sometimes true    Ran Out of Food in the Last Year: Sometimes true  Transportation Needs: No Transportation Needs (05/05/2023)   Received from [facility]   PRAPARE - Transportation    In the past 12 months, has lack of transportation kept you from medical appointments or from getting medications?: No    Lack of Transportation (Non-Medical): No  Physical Activity: Sufficiently Active (09/24/2020)   Received from [facility], [facility]   Exercise Vital Sign    Days of Exercise per Week: 6 days    Minutes of Exercise per Session: 30 min  Stress: No Stress Concern Present (09/24/2020)   Received from [facility], [facility]   [employer] of Occupational Health - Occupational Stress Questionnaire    Feeling of Stress : Not at all  Social Connections: Socially Integrated (09/24/2020)   Received from [facility], [facility]   Social Connection and Isolation Panel [NHANES]    Frequency of Communication with Friends and Family: More than three times a week    Frequency of Social Gatherings with Friends and Family: Once a week    Attends Religious Services: More than 4 times per year    Active Member of Clubs or Organizations: Yes    Attends Banker Meetings: More than 4  times per year    Marital Status: Married  Catering manager Violence: Not At Risk (03/14/2023)   Humiliation, Afraid, Rape, and Kick questionnaire    Fear of Current or Ex-Partner: No    Emotionally Abused: No    Physically Abused: No    Sexually Abused: No     FAMILY HISTORY:  Family History  Problem Relation Age of Onset   Diabetes Mother    Heart attack Father       REVIEW OF SYSTEMS:  Review of Systems  Constitutional:  Negative for chills and fever.  Respiratory:  Negative for cough and shortness of breath.   Cardiovascular:  Negative for chest pain and palpitations.  Gastrointestinal:  Positive for nausea and vomiting. Negative for abdominal pain, constipation and diarrhea.  Genitourinary:  Negative for dysuria and urgency.  All other systems reviewed and are negative.   VITAL SIGNS:  Temp:  [98 F (36.7 C)] 98 F (36.7 C) (10/31 0738) Pulse Rate:  [102-116] 102 (10/31 1015) Resp:  [17-33] 18 (10/31 1030) BP: (90-129)/(63-82) 107/82 (10/31 1030) SpO2:  [96 %-100 %] 100 % (10/31 1030) Weight:  [79.8 kg] 79.8 kg (10/31 0739)     Height: 5'  7" (170.2 cm) Weight: 79.8 kg BMI (Calculated): 27.56   INTAKE/OUTPUT:  No intake/output data recorded.  PHYSICAL EXAM:  Physical Exam Vitals and nursing note reviewed. Exam conducted with a chaperone present.  Constitutional:      General: He is not in acute distress.    Appearance: Normal appearance. He is obese. He is not ill-appearing.     Comments: Resting in bed; NAD  HENT:     Head: Normocephalic and atraumatic.  Eyes:     General: No scleral icterus.    Conjunctiva/sclera: Conjunctivae normal.  Cardiovascular:     Rate and Rhythm: Tachycardia present. Rhythm irregularly irregular.  Pulmonary:     Effort: Pulmonary effort is normal. No respiratory distress.  Abdominal:     General: Abdomen is protuberant. There is distension.     Palpations: Abdomen is soft.     Tenderness: There is no abdominal tenderness. There is no guarding or rebound.     Hernia: No hernia is present.     Comments: Abdomen is soft, he is distended but feels this is close to his baseline, tympanic, non-tender, no rebound/guarding. He is without peritonitis  Genitourinary:    Comments: Deferred Skin:    General: Skin is warm and dry.     Findings: No erythema.  Neurological:     General: No focal deficit present.     Mental Status: He is alert and oriented to person, place, and time.  Psychiatric:        Mood and Affect: Mood normal.        Behavior: Behavior normal.      Labs:     Latest Ref Rng & Units 07/14/2023    7:40 AM 07/08/2023    8:34 AM 07/01/2023    8:05 AM  CBC  WBC 4.0 - 10.5 K/uL 9.4  7.7  4.0   Hemoglobin 13.0 - 17.0 g/dL 09.8  11.9  14.7   Hematocrit 39.0 - 52.0 % 32.7  34.5  30.4   Platelets 150 - 400 K/uL 78  151  223       Latest Ref Rng & Units 07/14/2023    9:56 AM 07/08/2023    8:34 AM 07/01/2023    8:05 AM  CMP  Glucose 70 - 99 mg/dL 829  280  294   BUN 8 - 23 mg/dL 29  21  24    Creatinine 0.61 - 1.24 mg/dL 4.78  2.95  6.21   Sodium 135 - 145 mmol/L 132   134  134   Potassium 3.5 - 5.1 mmol/L 3.6  4.5  4.4   Chloride 98 - 111 mmol/L 103  100  104   CO2 22 - 32 mmol/L 14  26  24    Calcium 8.9 - 10.3 mg/dL 6.4  8.4  9.0   Total Protein 6.5 - 8.1 g/dL 3.7  7.1  7.5   Total Bilirubin 0.3 - 1.2 mg/dL 0.6  0.8  0.6   Alkaline Phos 38 - 126 U/L 41  135  112   AST 15 - 41 U/L 20  13  13    ALT 0 - 44 U/L 17  21  16       Imaging studies:   CT Abdomen/Pelvis (07/14/2023) personally reviewed with dilated loops of small bowel with gradual tapering in the ileum, TI decompressed, stomach is not markedly distended, no free air, no free fluid, no pneumatosis.   Radiologist read pending...   Assessment/Plan: (ICD-10's: K57.7) 67 y.o. male presenting with nausea/emesis found to have dilated loops of small bowel with gradual taper concerning for possible ileus vs SBO, complicated by hypocalcemia, Hx of multiple myeloma, PAF on Eliquis    - On review of CT, there is certainly dilated loops of small bowel however this appears to gradually taper at level of the ileum. Stomach is not markedly distended. He has a virgin abdomen. Additionally, symptoms of N/V have resolved at time of my evaluation. This certainly favors against true SBO, but remains a possibility. I do favor ileus in the setting of hypocalcemia. Regardless, would recommend similar management.    - I do not think he needs any emergent surgical intervention; He is certainly without peritonitis - He certainly needs to be NPO with IVF support.  - He does need electrolyte correction  - Discussed role for NGT placement in this setting. Given that his symptoms have resolved and he feels better, I think we can hold off for now. Should N/V recur, I do think he will need this. He is in agreement with this.  - Monitor abdominal examination; on-going bowel function - Consider serial KUBs - Mobilize - Appreciate medicine admission; further recommendation per their service; we will follow along    All of  the above findings and recommendations were discussed with the patient, and all of patient's questions were answered to his expressed satisfaction.  Thank you for the opportunity to participate in this patient's care.   -- Lynden Oxford, PA-C [facility] 07/14/2023, 11:40 AM M-F: 7am - 4pm

## 2023-07-14 NOTE — ED Notes (Signed)
ED TO INPATIENT HANDOFF REPORT  ED Nurse Name and Phone #: Al Corpus, RN [phone]  S Name/Age/Gender Victor Phillips 67 y.o. male Room/Bed: ED09A/ED09A  Code Status   Code Status: Full Code  Home/SNF/Other Home Patient oriented to: self, place, time, and situation Is this baseline? Yes   Triage Complete: Triage complete  Chief Complaint [facility]) [K56.7]  Triage Note Pt arrived via EMS from home. Complains of nausea and vomiting. No blood noticed per EMS. Pt has a history of A-fib. Denies any chest pain. Pt on Eliquis. Pt also complain of R leg pain. Pt is currently on 2L of O2.    Allergies No Known Allergies  Level of Care/Admitting Diagnosis ED Disposition     ED Disposition  Admit   Condition  --   Comment  Hospital Area: [facility] [[id number]]  Level of Care: Telemetry Medical [104]  Covid Evaluation: Asymptomatic - no recent exposure (last 10 days) testing not required  Diagnosis: [facility]) [[id number]]  Admitting Physician: Lovenia Kim [[id number]]  Attending Physician: [facility], AMY N [id number]  Certification:: I certify this patient will need inpatient services for at least 2 midnights  Expected Medical Readiness: 07/17/2023          B Medical/Surgery History Past Medical History:  Diagnosis Date   Diabetes mellitus without complication (HCC)    Hypertension    Past Surgical History:  Procedure Laterality Date   COLONOSCOPY N/A 02/13/2023   Procedure: COLONOSCOPY;  Surgeon: Toledo, Boykin Nearing, MD;  Location: [facility] ENDOSCOPY;  Service: Gastroenterology;  Laterality: N/A;   COLONOSCOPY N/A 02/14/2023   Procedure: COLONOSCOPY;  Surgeon: Toledo, Boykin Nearing, MD;  Location: [facility] ENDOSCOPY;  Service: Gastroenterology;  Laterality: N/A;   ESOPHAGOGASTRODUODENOSCOPY N/A 02/13/2023   Procedure: ESOPHAGOGASTRODUODENOSCOPY (EGD);  Surgeon: Toledo, Boykin Nearing, MD;  Location: [facility] ENDOSCOPY;  Service: Gastroenterology;  Laterality: N/A;     A IV  Location/Drains/Wounds Patient Lines/Drains/Airways Status     Active Line/Drains/Airways     Name Placement date Placement time Site Days   Peripheral IV 07/14/23 18 G Anterior;Left Forearm 07/14/23  0751  Forearm  less than 1   Peripheral IV 07/14/23 20 G 1" Left;Lateral;Proximal Forearm 07/14/23  0835  Forearm  less than 1            Intake/Output Last 24 hours  Intake/Output Summary (Last 24 hours) at 07/14/2023 1449 Last data filed at 07/14/2023 1238 Gross per 24 hour  Intake 3150 ml  Output --  Net 3150 ml    Labs/Imaging Results for orders placed or performed during the hospital encounter of 07/14/23 (from the past 48 hour(s))  Lactic acid, plasma     Status: Abnormal   Collection Time: 07/14/23  7:40 AM  Result Value Ref Range   Lactic Acid, Venous 4.1 (HH) 0.5 - 1.9 mmol/L    Comment: CRITICAL RESULT CALLED TO, READ BACK BY AND VERIFIED WITH ZOE [facility] 07/14/23 0835 KLW Performed at [facility], [address] [place], [place] [address]   CBC with Differential     Status: Abnormal   Collection Time: 07/14/23  7:40 AM  Result Value Ref Range   WBC 9.4 4.0 - 10.5 K/uL   RBC 3.44 (L) 4.22 - 5.81 MIL/uL   Hemoglobin 11.3 (L) 13.0 - 17.0 g/dL   HCT 58.5 (L) 27.7 - 82.4 %   MCV 95.1 80.0 - 100.0 fL   MCH 32.8 26.0 - 34.0 pg   MCHC 34.6 30.0 - 36.0  g/dL   RDW 22.0 25.4 - 27.0 %   Platelets 78 (L) 150 - 400 K/uL    Comment: Immature Platelet Fraction may be clinically indicated, consider ordering this additional test [id number] PLATELET COUNT CONFIRMED BY SMEAR    nRBC 0.3 (H) 0.0 - 0.2 %   Neutrophils Relative % 93 %   Neutro Abs 8.8 (H) 1.7 - 7.7 K/uL   Lymphocytes Relative 0 %   Lymphs Abs 0.0 (L) 0.7 - 4.0 K/uL   Monocytes Relative 4 %   Monocytes Absolute 0.4 0.1 - 1.0 K/uL   Eosinophils Relative 2 %   Eosinophils Absolute 0.1 0.0 - 0.5 K/uL   Basophils Relative 0 %   Basophils Absolute 0.0 0.0 - 0.1 K/uL   Immature Granulocytes 1 %    Abs Immature Granulocytes 0.06 0.00 - 0.07 K/uL    Comment: Performed at [facility], [address] [place], [place] [address]  Protime-INR     Status: Abnormal   Collection Time: 07/14/23  7:40 AM  Result Value Ref Range   Prothrombin Time 16.0 (H) 11.4 - 15.2 seconds   INR 1.3 (H) 0.8 - 1.2    Comment: (NOTE) INR goal varies based on device and disease states. Performed at [facility], [address] [place], [place] [address]   APTT     Status: None   Collection Time: 07/14/23  7:40 AM  Result Value Ref Range   aPTT 26 24 - 36 seconds    Comment: Performed at [facility], [address] [place], [place] [address]  Lactic acid, plasma     Status: Abnormal   Collection Time: 07/14/23  9:56 AM  Result Value Ref Range   Lactic Acid, Venous 6.7 (HH) 0.5 - 1.9 mmol/L    Comment: CRITICAL VALUE NOTED. VALUE IS CONSISTENT WITH PREVIOUSLY REPORTED/CALLED VALUE HNM Performed at [facility], [address] [place], [place] [address]   Lipase, blood     Status: None   Collection Time: 07/14/23  9:56 AM  Result Value Ref Range   Lipase 21 11 - 51 U/L    Comment: Performed at [facility], [address] [place], [place] [address]  Comprehensive metabolic panel     Status: Abnormal   Collection Time: 07/14/23  9:56 AM  Result Value Ref Range   Sodium 132 (L) 135 - 145 mmol/L   Potassium 3.6 3.5 - 5.1 mmol/L   Chloride 103 98 - 111 mmol/L   CO2 14 (L) 22 - 32 mmol/L   Glucose, Bld 138 (H) 70 - 99 mg/dL    Comment: Glucose reference range applies only to samples taken after fasting for at least 8 hours.   BUN 29 (H) 8 - 23 mg/dL   Creatinine, Ser 3.50 0.61 - 1.24 mg/dL   Calcium 6.4 (LL) 8.9 - 10.3 mg/dL    Comment: CRITICAL RESULT CALLED TO, READ BACK BY AND VERIFIED WITH ZOYIE [facility]  RN 1030 07/14/23 HNM    Total Protein 3.7 (L) 6.5 - 8.1 g/dL   Albumin 1.9 (L) 3.5 - 5.0 g/dL   AST 20 15 - 41 U/L   ALT 17 0 - 44 U/L    Alkaline Phosphatase 41 38 - 126 U/L   Total Bilirubin 0.6 0.3 - 1.2 mg/dL   GFR, Estimated >09 >38 mL/min    Comment: (NOTE) Calculated using the CKD-EPI Creatinine Equation (2021)    Anion gap 15 5 - 15  Comment: Performed at [facility], [address] [place], [place] [address]  Magnesium     Status: Abnormal   Collection Time: 07/14/23  9:56 AM  Result Value Ref Range   Magnesium 1.3 (L) 1.7 - 2.4 mg/dL    Comment: Performed at [facility], [address] [place], [place] [address]   CT ABDOMEN PELVIS W CONTRAST  Result Date: 07/14/2023 CLINICAL DATA:  Bowel obstruction suspected. Nausea and vomiting. Right leg pain. EXAM: CT ABDOMEN AND PELVIS WITH CONTRAST TECHNIQUE: Multidetector CT imaging of the abdomen and pelvis was performed using the standard protocol following bolus administration of intravenous contrast. RADIATION DOSE REDUCTION: This exam was performed according to the departmental dose-optimization program which includes automated exposure control, adjustment of the mA and/or kV according to patient size and/or use of iterative reconstruction technique. CONTRAST:  OMNIPAQUE IOHEXOL 300 MG/ML  SOLN COMPARISON:  CT scan abdomen and pelvis from 02/11/2023. FINDINGS: Lower chest: The lung bases are clear. No pleural effusion. The heart is normal in size. No pericardial effusion. Hepatobiliary: The liver is normal in size. Non-cirrhotic configuration. No suspicious mass. These is mild diffuse hepatic steatosis. No intrahepatic or extrahepatic bile duct dilation. No calcified gallstones. Normal gallbladder wall thickness. No pericholecystic inflammatory changes. Pancreas: Unremarkable. No pancreatic ductal dilatation or surrounding inflammatory changes. Spleen: Within normal limits. No focal lesion. Adrenals/Urinary Tract: Adrenal glands are unremarkable. No suspicious renal mass. There are at least 2 subcentimeter sized simple cysts in the right  kidney. No hydronephrosis. No renal or ureteric calculi. Unremarkable urinary bladder. Stomach/Bowel: There are multiple loops of small bowel which are dilated measuring up to 3.4 cm in diameter. The gradually taper in the right lower quadrant (series 6, image 33). No point of transition seen. Several distal ileal loops are nondilated. The colon contains moderate amount of stool burden without abnormal dilation. A normal appendix is not seen and may be surgically absent. No inflammatory changes in the right lower quadrant. No abnormal bowel wall thickening or surrounding fat stranding. Vascular/Lymphatic: No ascites or pneumoperitoneum. No abdominal or pelvic lymphadenopathy, by size criteria. No aneurysmal dilation of the major abdominal arteries. There are moderate peripheral atherosclerotic vascular calcifications of the aorta and its major branches. Reproductive: Normal size prostate. Symmetric seminal vesicles. Other: There are bilateral small fat containing inguinal hernias. The soft tissues and abdominal wall are otherwise unremarkable. Musculoskeletal: No suspicious osseous lesions. There are mild multilevel degenerative changes in the visualized spine. IMPRESSION: *There are disproportionately dilated multiple small bowel loops which gradually tapers in the right lower quadrant without discrete point of transition. Findings favor adynamic ileus. Correlate clinically. Consider continued radiological follow-up to exclude developing small bowel obstruction. No suspicious mass or inflammatory changes noted. No pneumoperitoneum, ascites, pneumatosis or portal venous gas. *Multiple other nonacute observations, as described above. Electronically Signed   By: Jules Schick M.D.   On: 07/14/2023 13:38   CT Head Wo Contrast  Result Date: 07/14/2023 CLINICAL DATA:  Provided history: Transient ischemic attack (TIA). Nausea. Vomiting. EXAM: CT HEAD WITHOUT CONTRAST TECHNIQUE: Contiguous axial images were obtained  from the base of the skull through the vertex without intravenous contrast. RADIATION DOSE REDUCTION: This exam was performed according to the departmental dose-optimization program which includes automated exposure control, adjustment of the mA and/or kV according to patient size and/or use of iterative reconstruction technique. COMPARISON:  Head CT 03/11/2023. FINDINGS: Brain: No age advanced or lobar predominant parenchymal atrophy. There is no acute intracranial hemorrhage. No demarcated  cortical infarct. No extra-axial fluid collection. No evidence of an intracranial mass. No midline shift. Vascular: No hyperdense vessel.  Atherosclerotic calcifications. Skull: No calvarial fracture or aggressive osseous lesion. Sinuses/Orbits: No mass or acute finding within the imaged orbits. Minimal mucosal thickening within the bilateral ethmoid sinuses. Minimal mucosal thickening, and tiny mucous retention cyst, within the left sphenoid sinus. IMPRESSION: 1. No evidence of an acute intracranial abnormality. 2. Minor paranasal sinus disease as described. Electronically Signed   By: Jackey Loge D.O.   On: 07/14/2023 12:35   DG Chest Port 1 View  Result Date: 07/14/2023 CLINICAL DATA:  Sepsis.  Nausea and vomiting.  Atrial fibrillation. EXAM: PORTABLE CHEST 1 VIEW COMPARISON:  05/28/2023 FINDINGS: Very low lung volumes limit evaluation. Both lungs are grossly clear. Heart size is within normal limits. Old left clavicle fracture deformity again noted. IMPRESSION: Very low lung volumes limit evaluation.  No acute findings. Electronically Signed   By: Danae Orleans M.D.   On: 07/14/2023 08:40    Pending Labs Unresulted Labs (From admission, onward)     Start     Ordered   07/15/23 0500  Basic metabolic panel  Tomorrow morning,   STAT        07/14/23 1322   07/15/23 0500  CBC  Tomorrow morning,   STAT        07/14/23 1322   07/14/23 1349  Lactic acid, plasma  (Lactic Acid)  STAT Now then every 3 hours,   STAT       07/14/23 1348   07/14/23 0739  Urinalysis, w/ Reflex to Culture (Infection Suspected) -Urine, Clean Catch  (Undifferentiated presentation (screening labs and basic nursing orders))  ONCE - URGENT,   URGENT       Question:  Specimen Source  Answer:  Urine, Clean Catch   07/14/23 0738   07/14/23 0738  Blood Culture (routine x 2)  (Undifferentiated presentation (screening labs and basic nursing orders))  BLOOD CULTURE X 2,   STAT      07/14/23 0738            Vitals/Pain Today's Vitals   07/14/23 1330 07/14/23 1345 07/14/23 1400 07/14/23 1430  BP: 117/88 107/78 (!) 103/91 126/84  Pulse: (!) 111 (!) 105 (!) 104 (!) 105  Resp: 19 15 16 18   Temp:      TempSrc:      SpO2: 100% 100% 100% 100%  Weight:      Height:      PainSc:        Isolation Precautions No active isolations  Medications Medications  lidocaine (XYLOCAINE) 2 % jelly 1 Application (0 Applications Other Hold 07/14/23 1155)  oxymetazoline (AFRIN) 0.05 % nasal spray 1 spray (0 sprays Each Nare Hold 07/14/23 1157)  acetaminophen (TYLENOL) tablet 650 mg (has no administration in time range)    Or  acetaminophen (TYLENOL) suppository 650 mg (has no administration in time range)  ondansetron (ZOFRAN) tablet 4 mg (has no administration in time range)    Or  ondansetron (ZOFRAN) injection 4 mg (has no administration in time range)  promethazine (PHENERGAN) 12.5 mg in sodium chloride 0.9 % 50 mL IVPB (has no administration in time range)  insulin aspart (novoLOG) injection 0-5 Units (has no administration in time range)  insulin aspart (novoLOG) injection 0-15 Units (has no administration in time range)  lactated ringers infusion (has no administration in time range)  sodium chloride 0.9 % bolus 1,000 mL (0 mLs Intravenous Stopped 07/14/23 0822)  lactated  ringers bolus 2,000 mL (0 mLs Intravenous Stopped 07/14/23 1035)  calcium gluconate 2 g/ 100 mL sodium chloride IVPB (0 mg Intravenous Stopped 07/14/23 1210)  iohexol  (OMNIPAQUE) 300 MG/ML solution 100 mL (100 mLs Intravenous Contrast Given 07/14/23 1054)  magnesium sulfate IVPB 2 g 50 mL (0 g Intravenous Stopped 07/14/23 1238)    Mobility walks     Focused Assessments Cardiac Assessment Handoff:  Cardiac Rhythm: Atrial fibrillation Lab Results  Component Value Date   TROPONINI <0.03 01/25/2018   No results found for: "DDIMER" Does the Patient currently have chest pain? No    R Recommendations: See Admitting Provider Note  Report given to:   Additional Notes: A&Ox4, NAD, calm, interactive, no emesis in ED, gen surg thinks likely sluggish bowel 2/2 low calcium, holding off on NGT, given mag and Ca, and IVF, ambulaotry but hasn't in ED, "feels better"

## 2023-07-14 NOTE — Assessment & Plan Note (Signed)
Suspect secondary to intractable nausea and vomiting Status post magnesium 2 g IV one-time dose ordered by EDP Recheck magnesium level in the a.m.

## 2023-07-14 NOTE — Plan of Care (Signed)
  Problem: Coping: Goal: Ability to adjust to condition or change in health will improve Outcome: Progressing   Problem: Fluid Volume: Goal: Ability to maintain a balanced intake and output will improve Outcome: Progressing   Problem: Health Behavior/Discharge Planning: Goal: Ability to manage health-related needs will improve Outcome: Progressing   

## 2023-07-14 NOTE — ED Notes (Signed)
To CT at this time, sleeping, arousable to voice, alert, NAD, calm, no changes. Family at [facility].

## 2023-07-14 NOTE — Assessment & Plan Note (Signed)
Platelets 78 on admission; Check CBC in the a.m.

## 2023-07-14 NOTE — Telephone Encounter (Addendum)
Victor Phillips called to follow up on MRI request I advised her that patient Cardiologist has agreed to order MRI and while checking chart found that patient is in ER and going to be admitted for possible ileus. I informed her of this. Of note, patient is scheduled for lab/ doctor. Chemotherapy tomorrow.

## 2023-07-14 NOTE — Assessment & Plan Note (Signed)
Continue output patient follow-up with hematology/oncology

## 2023-07-14 NOTE — Hospital Course (Signed)
Mr. Victor Phillips is a 67 year old male with non-insulin-dependent diabetes mellitus, hypertension, hyperlipidemia, history of multiple myeloma, atrial fibrillation on Eliquis, who presents to the emergency department for chief concerns of intractable nausea and vomiting.  Vitals in the ED showed temperature of 98, respiration rate 25, heart rate of 116, blood pressure 112/74, SpO2 of 97% on 2 L nasal cannula.  Serum sodium is 132, potassium 3.6, chloride 103, bicarb 14, BUN of 29, serum creatinine 0.89, EGFR greater than 60, nonfasting blood glucose 138, WBC 9.4, hemoglobin 11.3, platelets of 78.  Lactic acid was initially 4.1 and on repeat was 6.7.  ED treatment: magnesium 2 g IV one-time dose, sodium chloride 1 L bolus, LR 2 L bolus, calcium gluconate 2 g IV.  EDP discussed with general surgery, who reviewed CT imaging and recommends conservative management at this time.  Can place NG tube if the patient has recurrent intractable nausea and vomiting.

## 2023-07-14 NOTE — Assessment & Plan Note (Signed)
Home Eliquis not resumed on admission as patient has ileus/small bowel obstruction AM team to resume when the benefits outweigh the risk

## 2023-07-15 ENCOUNTER — Inpatient Hospital Stay: Payer: Medicare HMO | Admitting: Oncology

## 2023-07-15 ENCOUNTER — Encounter: Payer: Self-pay | Admitting: Oncology

## 2023-07-15 ENCOUNTER — Inpatient Hospital Stay: Payer: Medicare HMO

## 2023-07-15 DIAGNOSIS — K567 Ileus, unspecified: Secondary | ICD-10-CM | POA: Diagnosis not present

## 2023-07-15 LAB — COMPREHENSIVE METABOLIC PANEL
ALT: 39 U/L (ref 0–44)
AST: 76 U/L — ABNORMAL HIGH (ref 15–41)
Albumin: 3.2 g/dL — ABNORMAL LOW (ref 3.5–5.0)
Alkaline Phosphatase: 60 U/L (ref 38–126)
Anion gap: 12 (ref 5–15)
BUN: 44 mg/dL — ABNORMAL HIGH (ref 8–23)
CO2: 23 mmol/L (ref 22–32)
Calcium: 7.2 mg/dL — ABNORMAL LOW (ref 8.9–10.3)
Chloride: 103 mmol/L (ref 98–111)
Creatinine, Ser: 1.43 mg/dL — ABNORMAL HIGH (ref 0.61–1.24)
GFR, Estimated: 54 mL/min — ABNORMAL LOW (ref >60)
Glucose, Bld: 120 mg/dL — ABNORMAL HIGH (ref 70–99)
Potassium: 4.3 mmol/L (ref 3.5–5.1)
Sodium: 138 mmol/L (ref 135–145)
Total Bilirubin: 1 mg/dL (ref 0.3–1.2)
Total Protein: 6.3 g/dL — ABNORMAL LOW (ref 6.5–8.1)

## 2023-07-15 LAB — BASIC METABOLIC PANEL
Anion gap: 7 (ref 5–15)
BUN: 42 mg/dL — ABNORMAL HIGH (ref 8–23)
CO2: 25 mmol/L (ref 22–32)
Calcium: 6.9 mg/dL — ABNORMAL LOW (ref 8.9–10.3)
Chloride: 104 mmol/L (ref 98–111)
Creatinine, Ser: 1.26 mg/dL — ABNORMAL HIGH (ref 0.61–1.24)
GFR, Estimated: >60 mL/min (ref >60)
Glucose, Bld: 83 mg/dL (ref 70–99)
Potassium: 4.4 mmol/L (ref 3.5–5.1)
Sodium: 136 mmol/L (ref 135–145)

## 2023-07-15 LAB — MAGNESIUM
Magnesium: 2.6 mg/dL — ABNORMAL HIGH (ref 1.7–2.4)
Magnesium: 3.1 mg/dL — ABNORMAL HIGH (ref 1.7–2.4)

## 2023-07-15 LAB — GLUCOSE, CAPILLARY
Glucose-Capillary: 109 mg/dL — ABNORMAL HIGH (ref 70–99)
Glucose-Capillary: 102 mg/dL — ABNORMAL HIGH (ref 70–99)
Glucose-Capillary: 83 mg/dL (ref 70–99)
Glucose-Capillary: 64 mg/dL — ABNORMAL LOW (ref 70–99)
Glucose-Capillary: 86 mg/dL (ref 70–99)

## 2023-07-15 LAB — CBC
HCT: 30.5 % — ABNORMAL LOW (ref 39.0–52.0)
Hemoglobin: 10.2 g/dL — ABNORMAL LOW (ref 13.0–17.0)
MCH: 33.3 pg (ref 26.0–34.0)
MCHC: 33.4 g/dL (ref 30.0–36.0)
MCV: 99.7 fL (ref 80.0–100.0)
Platelets: 60 10*3/uL — ABNORMAL LOW (ref 150–400)
RBC: 3.06 MIL/uL — ABNORMAL LOW (ref 4.22–5.81)
RDW: 15.3 % (ref 11.5–15.5)
WBC: 7.3 10*3/uL (ref 4.0–10.5)
nRBC: 0 % (ref 0.0–0.2)

## 2023-07-15 MED ORDER — DEXTROSE 50 % IV SOLN
12.5000 g | INTRAVENOUS | Status: AC
  Administered 2023-07-15: 12.5 g via INTRAVENOUS
  Filled 2023-07-15: qty 50

## 2023-07-15 MED ORDER — LACTATED RINGERS IV SOLN: INTRAVENOUS | Status: DC

## 2023-07-15 MED ORDER — PANTOPRAZOLE SODIUM 40 MG IV SOLR
40.0000 mg | INTRAVENOUS | Status: DC
  Administered 2023-07-15 – 2023-07-19 (×5): 40 mg via INTRAVENOUS
  Filled 2023-07-15 (×5): qty 10

## 2023-07-15 MED ORDER — MAGNESIUM SULFATE 2 GM/50ML IV SOLN
2.0000 g | Freq: Once | INTRAVENOUS | Status: AC
  Administered 2023-07-15: 2 g via INTRAVENOUS
  Filled 2023-07-15: qty 50

## 2023-07-15 NOTE — Assessment & Plan Note (Deleted)
Encourage oral hydration and avoid nephrotoxins.   

## 2023-07-15 NOTE — Progress Notes (Signed)
Assymptomatic 5 beat run of V-Tach noted. MD aware.  [name]

## 2023-07-15 NOTE — Assessment & Plan Note (Deleted)
No DVT on US Echo LVEF 50% -55%. He has cardiology appt  Recommend Lasix 20mg  daily PRN for edema

## 2023-07-15 NOTE — Plan of Care (Signed)
  Problem: Education: Goal: Ability to describe self-care measures that may prevent or decrease complications (Diabetes Survival Skills Education) will improve 07/15/2023 0205 by Cathren Harsh, RN Outcome: Progressing 07/15/2023 0204 by Cathren Harsh, RN Outcome: Progressing   Problem: Coping: Goal: Ability to adjust to condition or change in health will improve 07/15/2023 0205 by Cathren Harsh, RN Outcome: Progressing 07/15/2023 0204 by Cathren Harsh, RN Outcome: Progressing   Problem: Health Behavior/Discharge Planning: Goal: Ability to identify and utilize available resources and services will improve Outcome: Progressing   Problem: Metabolic: Goal: Ability to maintain appropriate glucose levels will improve Outcome: Progressing   Problem: Skin Integrity: Goal: Risk for impaired skin integrity will decrease Outcome: Progressing

## 2023-07-15 NOTE — Progress Notes (Signed)
OT Cancellation Note  Patient Details Name: Victor Phillips MRN: [id number] DOB: May 26, 1956   Cancelled Treatment:    Reason Eval/Treat Not Completed: OT screened, no needs identified, will sign off. Order received, chart reviewed. Per conversation with PT, pt reports back to baseline functional independence. No skilled OT needs identified. Will sign off. Please re-consult if additional needs arise.   Kathie Dike, M.S. OTR/L  07/15/23, 2:59 PM  ascom [phone]

## 2023-07-15 NOTE — Assessment & Plan Note (Deleted)
Check Echo -scheduled on Echo 06/14/23  Follow up with cardiology for evaluation.

## 2023-07-15 NOTE — Assessment & Plan Note (Deleted)
Bone marrow biopsy was reviewed. 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 Cycle 3 D15 Daratumumab was held due to cytopenia and being on anticoagulation.  Proceed with cycle 4 Daratumumab and Velcade with revlimid 10mg  2 weeks on 1 week off - Revlimid start will be delayed next week as patient declined drug delivery this week due to misunderstanding/miscommunication.  He gets Dexamethasone 20mg  weekly prior to Daratumumab treatments.   Continue Acyclovir, At high risk of thrombosis, recommend  Eliquis 2.5mg  BID. Rx sent He got 1 month free trial, pending additional approval for drug assistance.  Xegeva monthly.   Recommend calcium and vitamin D supplementation.   [facility] evaluation for BM transplant

## 2023-07-15 NOTE — Progress Notes (Signed)
[facility] SURGICAL PROGRESS NOTE (cpt [phone])  Hospital Day(s): 1.   Interval History: Patient seen and examined. Overnight, did require NGT placement. This has had 600 ccs out recorded. Patient reports he is doing well this morning. No abdominal pain, nausea, emesis. WBC normal at 7.3K. Hgb to 10.2. Slight AKI; sCr - 1.43; UO - unmeasured. Hyponatremia resolved; now 138. Still hypocalcemic to 7.2. Hypomagnesemia to 2.6. He has had recorded BM overnight; loose.   Review of Systems:  Constitutional: denies fever, chills  HEENT: denies cough or congestion  Respiratory: denies any shortness of breath  Cardiovascular: denies chest pain or palpitations  Gastrointestinal: denies abdominal pain, N/V Genitourinary: denies burning with urination or urinary frequency Musculoskeletal: denies pain, decreased motor or sensation  Vital signs in last 24 hours: [min-max] current  Temp:  [97.8 F (36.6 C)-98 F (36.7 C)] 98 F (36.7 C) (11/01 0420) Pulse Rate:  [88-116] 89 (11/01 0420) Resp:  [15-33] 16 (11/01 0420) BP: (90-145)/(63-91) 119/90 (11/01 0420) SpO2:  [95 %-100 %] 97 % (11/01 0420) Weight:  [79.8 kg] 79.8 kg (10/31 0739)     Height: 5\' 7"  (170.2 cm) Weight: 79.8 kg BMI (Calculated): 27.56   Intake/Output last 2 shifts:  10/31 0701 - 11/01 0700 In: 3518 [I.V.:368; IV Piggyback:3150] Out: 600 [Emesis/NG output:600]   Physical Exam:  Constitutional: alert, cooperative and no distress  HENT: normocephalic without obvious abnormality; NGT in place; 70 cm at nare; bilious  Eyes: PERRL, EOM's grossly intact and symmetric  Respiratory: breathing non-labored at rest  Cardiovascular: regular rate and sinus rhythm  Gastrointestinal: soft, non-tender, tympanic, no rebound/guarding  Musculoskeletal: no edema or wounds, motor and sensation grossly intact, NT    Labs:     Latest Ref Rng & Units 07/15/2023    6:30 AM 07/14/2023    7:40 AM 07/08/2023    8:34 AM  CBC  WBC  4.0 - 10.5 K/uL 7.3  9.4  7.7   Hemoglobin 13.0 - 17.0 g/dL 60.4  54.0  98.1   Hematocrit 39.0 - 52.0 % 30.5  32.7  34.5   Platelets 150 - 400 K/uL 60  78  151       Latest Ref Rng & Units 07/15/2023    6:30 AM 07/14/2023    9:56 AM 07/08/2023    8:34 AM  CMP  Glucose 70 - 99 mg/dL 191  478  295   BUN 8 - 23 mg/dL 44  29  21   Creatinine 0.61 - 1.24 mg/dL 6.21  3.08  6.57   Sodium 135 - 145 mmol/L 138  132  134   Potassium 3.5 - 5.1 mmol/L 4.3  3.6  4.5   Chloride 98 - 111 mmol/L 103  103  100   CO2 22 - 32 mmol/L 23  14  26    Calcium 8.9 - 10.3 mg/dL 7.2  6.4  8.4   Total Protein 6.5 - 8.1 g/dL 6.3  3.7  7.1   Total Bilirubin 0.3 - 1.2 mg/dL 1.0  0.6  0.8   Alkaline Phos 38 - 126 U/L 60  41  135   AST 15 - 41 U/L 76  20  13   ALT 0 - 44 U/L 39  17  21      Imaging studies:   KUB (07/15/2023) personally reviewed now with NGT in place, appears to be potentially very deep, no free air, appreciable distended bowel, and radiologist report reviewed below:  IMPRESSION: Esophageal tube tip  in the distal stomach.   Assessment/Plan: (ICD-10's: K56.7) 67 y.o. male presenting with nausea/emesis found to have dilated loops of small bowel with gradual taper concerning for possible ileus vs SBO, complicated by hypocalcemia, Hx of multiple myeloma, PAF on Eliquis    - Continue to favor ileus secondary to electrolyte derangement over mechanical obstruction. Given NGT placement overnight, will leave for at least 24 hours. I did discuss with RN staff regarding backing NGT to 60-65 cm at the nare (currently at 70 cm and appears very deep on KUB). If continues to have bowel function and output slows, can consider clamping trial tomorrow.   - Correct electrolytes   - Continue NPO for now  - No need for surgical intervention - Monitor abdominal examination; on-going bowel function - Serial KUB as needed   - Mobilize   - Further management per primary service; we will follow    All of the above  findings and recommendations were discussed with the patient, and the medical team, and all of patient's questions were answered to his expressed satisfaction.  -- Lynden Oxford, PA-C  [facility] 07/15/2023, 7:32 AM M-F: 7am - 4pm

## 2023-07-15 NOTE — Telephone Encounter (Signed)
Appts cancelled for 11/1 .

## 2023-07-15 NOTE — Assessment & Plan Note (Deleted)
Chemotherapy induced. Close monitor 

## 2023-07-15 NOTE — Progress Notes (Signed)
PROGRESS NOTE    Victor Phillips  ONG:[id number] DOB: 01-Sep-1956 DOA: 07/14/2023 PCP: Danella Penton, MD   Brief Narrative: 67 year old with past medical history significant for insulin-dependent diabetes, hypertension, hyperlipidemia, history of multiple myeloma, A-fib on Eliquis presents complaining of intractable nausea and vomiting.  He was found to have hyponatremia sodium 132, tachycardic heart rate 116, lactic acid 4.1 subsequently increased to 6.7.  He received IV fluids.  CT abdomen and pelvis showed disproportionately dilated multiple small bowel loops which gradually tapers in the right lower quadrant without discrete point of transition.  Findings favor adynamic ileus.  Consider continue imaging follow-up to rule out developing a small bowel obstruction.  No suspicious mass or inflammatory changes.  Patient admitted for SBO versus small bowel obstruction.  Surgery was consulted.  NG tube was placed.  Assessment & Plan:   Principal Problem:   Ileus (HCC) Active Problems:   Type 2 diabetes mellitus (HCC)   Hypercholesteremia   Multiple myeloma (HCC)   Atrial fibrillation (HCC)   Thrombocytopenia (HCC)   Hypomagnesemia   Hypocalcemia   1-ileus versus early SBO: -Appreciate surgery evaluation and recommendations -NG tube in placed -IV fluids, as needed Phenergan Continue current management. He had BM today, watery stool/  Needs to ambulate.  K at 4.   Hypocalcemia in the setting of hypoalbuminemia: Corrected calcium 8.1. Received 2 g of IV calcium gluconate.  Hypomagnesemia: Replaced  Thrombocytopenia: monitor. In setting of acute illness.   CKD stage II Previous cr range 1.1---1.6 Monitor renal function.   A-fib -Holding Eliquis in the setting of a small bowel obstruction versus ileus. -Follow surgery recommendation in regards anticoagulation Would hold anticoagulation due to thrombocytopenia as well.   Multiple myeloma: -Continue follow-up with oncology  outpatient  Hypercholesterolemia:  -Hold statins when able to tolerate oral  Diabetes type 2: -Hold oral hypoglycemic agents. -Sliding scale insulin as needed         Estimated body mass index is 27.57 kg/m as calculated from the following:   Height as of this encounter: 5\' 7"  (1.702 m).   Weight as of this encounter: 79.8 kg.   DVT prophylaxis: SCD Code Status: Full code Family Communication: Care discussed with patient.  Disposition Plan:  Status is: Inpatient Remains inpatient appropriate because: management of Ileus, Vs SBO    Consultants:  Surgery   Procedures:  none  Antimicrobials:    Subjective: He is alert, feels less distended, had BM today. Denies abdominal pain. NG tube in placed it was advanced 5 cm. Draining greenish fluid.    Objective: Vitals:   07/14/23 1500 07/14/23 1545 07/14/23 2032 07/15/23 0420  BP:  (!) 145/76 98/73 (!) 119/90  Pulse:  (!) 108 88 89  Resp: (!) 23 19 20 16   Temp:  97.8 F (36.6 C) 97.9 F (36.6 C) 98 F (36.7 C)  TempSrc:  Oral Oral   SpO2: 100% 100% 95% 97%  Weight:      Height:        Intake/Output Summary (Last 24 hours) at 07/15/2023 0748 Last data filed at 07/15/2023 4401 Gross per 24 hour  Intake 3517.98 ml  Output 600 ml  Net 2917.98 ml   Filed Weights   07/14/23 0739  Weight: 79.8 kg    Examination:  General exam: Appears calm and comfortable  Respiratory system: Clear to auscultation. Respiratory effort normal. Cardiovascular system: S1 & S2 heard, RRR. No JVD, murmurs, rubs, gallops or clicks. No pedal edema. Gastrointestinal system: Abdomen is distended,  Soft, NT , decreased BM Central nervous system: Alert and oriented.  Extremities: Symmetric 5 x 5 power.   Data Reviewed: I have personally reviewed following labs and imaging studies  CBC: Recent Labs  Lab 07/08/23 0834 07/14/23 0740 07/15/23 0630  WBC 7.7 9.4 7.3  NEUTROABS 6.3 8.8*  --   HGB 11.5* 11.3* 10.2*  HCT 34.5*  32.7* 30.5*  MCV 96.6 95.1 99.7  PLT 151 78* 60*   Basic Metabolic Panel: Recent Labs  Lab 07/08/23 0834 07/14/23 0956 07/15/23 0630  NA 134* 132* 138  K 4.5 3.6 4.3  CL 100 103 103  CO2 26 14* 23  GLUCOSE 280* 138* 120*  BUN 21 29* 44*  CREATININE 1.65* 0.89 1.43*  CALCIUM 8.4* 6.4* 7.2*  MG  --  1.3* 2.6*   GFR: Estimated Creatinine Clearance: 52.2 mL/min (A) (by C-G formula based on SCr of 1.43 mg/dL (H)). Liver Function Tests: Recent Labs  Lab 07/08/23 0834 07/14/23 0956 07/15/23 0630  AST 13* 20 76*  ALT 21 17 39  ALKPHOS 135* 41 60  BILITOT 0.8 0.6 1.0  PROT 7.1 3.7* 6.3*  ALBUMIN 3.7 1.9* 3.2*   Recent Labs  Lab 07/14/23 0956  LIPASE 21   No results for input(s): "AMMONIA" in the last 168 hours. Coagulation Profile: Recent Labs  Lab 07/14/23 0740  INR 1.3*   Cardiac Enzymes: No results for input(s): "CKTOTAL", "CKMB", "CKMBINDEX", "TROPONINI" in the last 168 hours. BNP (last 3 results) No results for input(s): "PROBNP" in the last 8760 hours. HbA1C: No results for input(s): "HGBA1C" in the last 72 hours. CBG: Recent Labs  Lab 07/14/23 1556 07/14/23 2255  GLUCAP 219* 109*   Lipid Profile: No results for input(s): "CHOL", "HDL", "LDLCALC", "TRIG", "CHOLHDL", "LDLDIRECT" in the last 72 hours. Thyroid Function Tests: No results for input(s): "TSH", "T4TOTAL", "FREET4", "T3FREE", "THYROIDAB" in the last 72 hours. Anemia Panel: No results for input(s): "VITAMINB12", "FOLATE", "FERRITIN", "TIBC", "IRON", "RETICCTPCT" in the last 72 hours. Sepsis Labs: Recent Labs  Lab 07/14/23 0740 07/14/23 0956 07/14/23 1459 07/14/23 1635  LATICACIDVEN 4.1* 6.7* 1.3 1.5    Recent Results (from the past 240 hour(s))  Blood Culture (routine x 2)     Status: None (Preliminary result)   Collection Time: 07/14/23  7:40 AM   Specimen: BLOOD  Result Value Ref Range Status   Specimen Description BLOOD LEFT FOREARM  Final   Special Requests   Final    BOTTLES  DRAWN AEROBIC AND ANAEROBIC Blood Culture adequate volume   Culture   Final    NO GROWTH < 24 HOURS Performed at [facility], [address] [place], [place] [address]    Report Status PENDING  Incomplete  Blood Culture (routine x 2)     Status: None (Preliminary result)   Collection Time: 07/14/23  8:30 AM   Specimen: BLOOD  Result Value Ref Range Status   Specimen Description BLOOD BLOOD LEFT FOREARM  Final   Special Requests   Final    BOTTLES DRAWN AEROBIC AND ANAEROBIC Blood Culture results may not be optimal due to an excessive volume of blood received in culture bottles   Culture   Final    NO GROWTH < 24 HOURS Performed at [facility], [address] [place], [place] [address]    Report Status PENDING  Incomplete         Radiology Studies: DG Abd Portable 1V  Result Date: 07/15/2023 CLINICAL DATA:  NG tube placement  EXAM: PORTABLE ABDOMEN - 1 VIEW COMPARISON:  CT 07/14/2023 FINDINGS: Esophageal tube tip in the distal stomach. Dilated bowel in the upper abdomen IMPRESSION: Esophageal tube tip in the distal stomach. Electronically Signed   By: Jasmine Pang M.D.   On: 07/15/2023 00:46   CT ABDOMEN PELVIS W CONTRAST  Result Date: 07/14/2023 CLINICAL DATA:  Bowel obstruction suspected. Nausea and vomiting. Right leg pain. EXAM: CT ABDOMEN AND PELVIS WITH CONTRAST TECHNIQUE: Multidetector CT imaging of the abdomen and pelvis was performed using the standard protocol following bolus administration of intravenous contrast. RADIATION DOSE REDUCTION: This exam was performed according to the departmental dose-optimization program which includes automated exposure control, adjustment of the mA and/or kV according to patient size and/or use of iterative reconstruction technique. CONTRAST:  OMNIPAQUE IOHEXOL 300 MG/ML  SOLN COMPARISON:  CT scan abdomen and pelvis from 02/11/2023. FINDINGS: Lower chest: The lung bases are clear. No pleural effusion. The heart  is normal in size. No pericardial effusion. Hepatobiliary: The liver is normal in size. Non-cirrhotic configuration. No suspicious mass. These is mild diffuse hepatic steatosis. No intrahepatic or extrahepatic bile duct dilation. No calcified gallstones. Normal gallbladder wall thickness. No pericholecystic inflammatory changes. Pancreas: Unremarkable. No pancreatic ductal dilatation or surrounding inflammatory changes. Spleen: Within normal limits. No focal lesion. Adrenals/Urinary Tract: Adrenal glands are unremarkable. No suspicious renal mass. There are at least 2 subcentimeter sized simple cysts in the right kidney. No hydronephrosis. No renal or ureteric calculi. Unremarkable urinary bladder. Stomach/Bowel: There are multiple loops of small bowel which are dilated measuring up to 3.4 cm in diameter. The gradually taper in the right lower quadrant (series 6, image 33). No point of transition seen. Several distal ileal loops are nondilated. The colon contains moderate amount of stool burden without abnormal dilation. A normal appendix is not seen and may be surgically absent. No inflammatory changes in the right lower quadrant. No abnormal bowel wall thickening or surrounding fat stranding. Vascular/Lymphatic: No ascites or pneumoperitoneum. No abdominal or pelvic lymphadenopathy, by size criteria. No aneurysmal dilation of the major abdominal arteries. There are moderate peripheral atherosclerotic vascular calcifications of the aorta and its major branches. Reproductive: Normal size prostate. Symmetric seminal vesicles. Other: There are bilateral small fat containing inguinal hernias. The soft tissues and abdominal wall are otherwise unremarkable. Musculoskeletal: No suspicious osseous lesions. There are mild multilevel degenerative changes in the visualized spine. IMPRESSION: *There are disproportionately dilated multiple small bowel loops which gradually tapers in the right lower quadrant without discrete  point of transition. Findings favor adynamic ileus. Correlate clinically. Consider continued radiological follow-up to exclude developing small bowel obstruction. No suspicious mass or inflammatory changes noted. No pneumoperitoneum, ascites, pneumatosis or portal venous gas. *Multiple other nonacute observations, as described above. Electronically Signed   By: Jules Schick M.D.   On: 07/14/2023 13:38   CT Head Wo Contrast  Result Date: 07/14/2023 CLINICAL DATA:  Provided history: Transient ischemic attack (TIA). Nausea. Vomiting. EXAM: CT HEAD WITHOUT CONTRAST TECHNIQUE: Contiguous axial images were obtained from the base of the skull through the vertex without intravenous contrast. RADIATION DOSE REDUCTION: This exam was performed according to the departmental dose-optimization program which includes automated exposure control, adjustment of the mA and/or kV according to patient size and/or use of iterative reconstruction technique. COMPARISON:  Head CT 03/11/2023. FINDINGS: Brain: No age advanced or lobar predominant parenchymal atrophy. There is no acute intracranial hemorrhage. No demarcated cortical infarct. No extra-axial fluid collection. No evidence of an intracranial mass. No  midline shift. Vascular: No hyperdense vessel.  Atherosclerotic calcifications. Skull: No calvarial fracture or aggressive osseous lesion. Sinuses/Orbits: No mass or acute finding within the imaged orbits. Minimal mucosal thickening within the bilateral ethmoid sinuses. Minimal mucosal thickening, and tiny mucous retention cyst, within the left sphenoid sinus. IMPRESSION: 1. No evidence of an acute intracranial abnormality. 2. Minor paranasal sinus disease as described. Electronically Signed   By: Jackey Loge D.O.   On: 07/14/2023 12:35   DG Chest Port 1 View  Result Date: 07/14/2023 CLINICAL DATA:  Sepsis.  Nausea and vomiting.  Atrial fibrillation. EXAM: PORTABLE CHEST 1 VIEW COMPARISON:  05/28/2023 FINDINGS: Very low  lung volumes limit evaluation. Both lungs are grossly clear. Heart size is within normal limits. Old left clavicle fracture deformity again noted. IMPRESSION: Very low lung volumes limit evaluation.  No acute findings. Electronically Signed   By: Danae Orleans M.D.   On: 07/14/2023 08:40        Scheduled Meds:  insulin aspart  0-15 Units Subcutaneous TID WC   insulin aspart  0-5 Units Subcutaneous QHS   lidocaine  1 Application Other Once   oxymetazoline  1 spray Each Nare Once   Continuous Infusions:  lactated ringers 150 mL/hr at 07/15/23 0618   promethazine (PHENERGAN) injection (IM or IVPB)       LOS: 1 day    Time spent: 35 minutes    Russell Quinney A Daris Aristizabal, MD [facility]   If 7PM-7AM, please contact night-coverage [email or web address]  07/15/2023, 7:48 AM

## 2023-07-15 NOTE — Evaluation (Signed)
Physical Therapy Evaluation Patient Details Name: Victor Phillips MRN: [id number] DOB: 01/04/1956 Today's Date: 07/15/2023  History of Present Illness  effery L Phillips is a 67 y.o. male with a history of diabetes, CKD, multiple myeloma, atrial fibrillation, hypertension who comes ED complaining of nausea vomiting and abdominal distention, worsening over the last several days.   Clinical Impression  Pt upright with nursing ambulating within the room upon arrival.  Pt ready to begin therapy.  Pt ambulates well with the FWW.  Pt noted to have minimal unsteadiness with sharp turns, however able to self-correct.  Pt overall moves well and feels as though he is at his baseline level of function.  Pt advised to utilize the walker until feeling comfortable and more steady to ambulate without.  Patient is at baseline, all education completed, and time is given to address all questions/concerns. No additional skilled PT services needed at this time, PT signing off. PT recommends daily ambulation ad lib or with nursing staff as needed to prevent deconditioning.        If plan is discharge home, recommend the following: Help with stairs or ramp for entrance;Assist for transportation   Can travel by private vehicle        Equipment Recommendations None recommended by PT  Recommendations for Other Services       Functional Status Assessment Patient has had a recent decline in their functional status and demonstrates the ability to make significant improvements in function in a reasonable and predictable amount of time.     Precautions / Restrictions Precautions Precautions: Fall Restrictions Weight Bearing Restrictions: No      Mobility  Bed Mobility Overal bed mobility: Modified Independent                  Transfers Overall transfer level: Modified independent                      Ambulation/Gait Ambulation/Gait assistance: Modified independent (Device/Increase time),  Supervision Gait Distance (Feet): 320 Feet Assistive device: Rolling walker (2 wheels) Gait Pattern/deviations: WFL(Within Functional Limits) Gait velocity: slightly decreased     General Gait Details: pt ambulates with some staggering of gait at times, but overall steady.  Stairs            Wheelchair Mobility     Tilt Bed    Modified Rankin (Stroke Patients Only)       Balance Overall balance assessment: Modified Independent                                           Pertinent Vitals/Pain Pain Assessment Pain Assessment: No/denies pain    Home Living Family/patient expects to be discharged to:: Private residence Living Arrangements: Spouse/significant other Available Help at Discharge: Family;Available 24 hours/day Type of Home: House Home Access: Stairs to enter Entrance Stairs-Rails: None Entrance Stairs-Number of Steps: 1   Home Layout: One level Home Equipment: Agricultural consultant (2 wheels)      Prior Function Prior Level of Function : Independent/Modified Independent                     Extremity/Trunk Assessment   Upper Extremity Assessment Upper Extremity Assessment: Overall WFL for tasks assessed    Lower Extremity Assessment Lower Extremity Assessment: Overall WFL for tasks assessed;Generalized weakness       Communication   Communication  Communication: No apparent difficulties  Cognition Arousal: Alert   Overall Cognitive Status: Within Functional Limits for tasks assessed                                          General Comments      Exercises     Assessment/Plan    PT Assessment Patient does not need any further PT services  PT Problem List         PT Treatment Interventions      PT Goals (Current goals can be found in the Care Plan section)  Acute Rehab PT Goals Patient Stated Goal: to go home. PT Goal Formulation: With patient Time For Goal Achievement: 07/29/23 Potential  to Achieve Goals: Good    Frequency       Co-evaluation               AM-PAC PT "6 Clicks" Mobility  Outcome Measure Help needed turning from your back to your side while in a flat bed without using bedrails?: None Help needed moving from lying on your back to sitting on the side of a flat bed without using bedrails?: None Help needed moving to and from a bed to a chair (including a wheelchair)?: None Help needed standing up from a chair using your arms (e.g., wheelchair or bedside chair)?: None Help needed to walk in hospital room?: A Little Help needed climbing 3-5 steps with a railing? : A Little 6 Click Score: 22    End of Session Equipment Utilized During Treatment: Gait belt Activity Tolerance: Patient tolerated treatment well Patient left: in bed Nurse Communication: Mobility status PT Visit Diagnosis: Unsteadiness on feet (R26.81);Muscle weakness (generalized) (M62.81)    Time: 1610-9604 PT Time Calculation (min) (ACUTE ONLY): 9 min   Charges:   PT Evaluation $PT Eval Low Complexity: 1 Low   PT General Charges $$ ACUTE PT VISIT: 1 Visit         Nolon Bussing, PT, DPT Physical Therapist - [facility]  07/15/23, 3:34 PM

## 2023-07-16 DIAGNOSIS — K567 Ileus, unspecified: Secondary | ICD-10-CM | POA: Diagnosis not present

## 2023-07-16 LAB — GLUCOSE, CAPILLARY
Glucose-Capillary: 108 mg/dL — ABNORMAL HIGH (ref 70–99)
Glucose-Capillary: 108 mg/dL — ABNORMAL HIGH (ref 70–99)
Glucose-Capillary: 118 mg/dL — ABNORMAL HIGH (ref 70–99)
Glucose-Capillary: 72 mg/dL (ref 70–99)
Glucose-Capillary: 85 mg/dL (ref 70–99)
Glucose-Capillary: 97 mg/dL (ref 70–99)
Glucose-Capillary: 99 mg/dL (ref 70–99)

## 2023-07-16 LAB — BASIC METABOLIC PANEL
Anion gap: 10 (ref 5–15)
BUN: 33 mg/dL — ABNORMAL HIGH (ref 8–23)
CO2: 22 mmol/L (ref 22–32)
Calcium: 6.9 mg/dL — ABNORMAL LOW (ref 8.9–10.3)
Chloride: 104 mmol/L (ref 98–111)
Creatinine, Ser: 1 mg/dL (ref 0.61–1.24)
GFR, Estimated: >60 mL/min (ref >60)
Glucose, Bld: 123 mg/dL — ABNORMAL HIGH (ref 70–99)
Potassium: 4.4 mmol/L (ref 3.5–5.1)
Sodium: 136 mmol/L (ref 135–145)

## 2023-07-16 LAB — CBC
HCT: 27.5 % — ABNORMAL LOW (ref 39.0–52.0)
Hemoglobin: 9.4 g/dL — ABNORMAL LOW (ref 13.0–17.0)
MCH: 32.9 pg (ref 26.0–34.0)
MCHC: 34.2 g/dL (ref 30.0–36.0)
MCV: 96.2 fL (ref 80.0–100.0)
Platelets: 48 10*3/uL — ABNORMAL LOW (ref 150–400)
RBC: 2.86 MIL/uL — ABNORMAL LOW (ref 4.22–5.81)
RDW: 15.1 % (ref 11.5–15.5)
WBC: 6 10*3/uL (ref 4.0–10.5)
nRBC: 0 % (ref 0.0–0.2)

## 2023-07-16 MED ORDER — DEXTROSE IN LACTATED RINGERS 5 % IV SOLN: INTRAVENOUS | Status: DC

## 2023-07-16 MED ORDER — DEXTROSE IN LACTATED RINGERS 5 % IV SOLN: INTRAVENOUS | Status: AC

## 2023-07-16 NOTE — Plan of Care (Signed)

## 2023-07-16 NOTE — Progress Notes (Signed)
[facility] SURGICAL PROGRESS NOTE (cpt [phone])  Hospital Day(s): 2.   Interval History: Patient seen and examined. Overnight reports passing some flatus, improved abdominal distention back to baseline.  Reports voiding without difficulty, and flatus.  Review of Systems:  Constitutional: denies fever, chills  HEENT: denies cough or congestion  Respiratory: denies any shortness of breath  Cardiovascular: denies chest pain or palpitations  Gastrointestinal: denies abdominal pain, N/V Genitourinary: denies burning with urination or urinary frequency Musculoskeletal: denies pain, decreased motor or sensation  Vital signs in last 24 hours: [min-max] current  Temp:  [97.5 F (36.4 C)-97.9 F (36.6 C)] 97.9 F (36.6 C) (11/02 0833) Pulse Rate:  [50-108] 108 (11/02 0833) Resp:  [16-18] 16 (11/02 0833) BP: (121-144)/(86-96) 142/96 (11/02 0833) SpO2:  [95 %-100 %] 100 % (11/02 0833)     Height: 5\' 7"  (170.2 cm) Weight: 79.8 kg BMI (Calculated): 27.56   Intake/Output last 2 shifts:  11/01 0701 - 11/02 0700 In: 1633.4 [I.V.:1633.4] Out: 750 [Urine:400; Emesis/NG output:350]   Physical Exam:  Constitutional: alert, cooperative and no distress  HENT: normocephalic without obvious abnormality; NGT in place; 70 cm at nare; bilious  Eyes: PERRL, EOM's grossly intact and symmetric  Respiratory: breathing non-labored at rest  Cardiovascular: regular rate and sinus rhythm  Gastrointestinal: soft, non-tender, tympanic, no rebound/guarding  Musculoskeletal: no edema or wounds, motor and sensation grossly intact, NT    Labs:     Latest Ref Rng & Units 07/15/2023    6:30 AM 07/14/2023    7:40 AM 07/08/2023    8:34 AM  CBC  WBC 4.0 - 10.5 K/uL 7.3  9.4  7.7   Hemoglobin 13.0 - 17.0 g/dL 65.7  84.6  96.2   Hematocrit 39.0 - 52.0 % 30.5  32.7  34.5   Platelets 150 - 400 K/uL 60  78  151       Latest Ref Rng & Units 07/15/2023    6:36 PM 07/15/2023    6:30 AM 07/14/2023     9:56 AM  CMP  Glucose 70 - 99 mg/dL 83  952  841   BUN 8 - 23 mg/dL 42  44  29   Creatinine 0.61 - 1.24 mg/dL 3.24  4.01  0.27   Sodium 135 - 145 mmol/L 136  138  132   Potassium 3.5 - 5.1 mmol/L 4.4  4.3  3.6   Chloride 98 - 111 mmol/L 104  103  103   CO2 22 - 32 mmol/L 25  23  14    Calcium 8.9 - 10.3 mg/dL 6.9  7.2  6.4   Total Protein 6.5 - 8.1 g/dL  6.3  3.7   Total Bilirubin 0.3 - 1.2 mg/dL  1.0  0.6   Alkaline Phos 38 - 126 U/L  60  41   AST 15 - 41 U/L  76  20   ALT 0 - 44 U/L  39  17        Assessment/Plan: (ICD-10's: K56.7) 67 y.o. male presenting with nausea/emesis found to have dilated loops of small bowel with gradual taper concerning for possible ileus vs SBO, complicated by hypocalcemia, Hx of multiple myeloma, PAF on Eliquis    - Continue to favor ileus secondary to electrolyte derangement over mechanical obstruction. Clamping trial, likely removal later this afternoon..   - Continue NPO for now  - No need for surgical intervention - Monitor abdominal examination; on-going bowel function - Serial KUB as needed   - Mobilize   -  Further management per primary service; we will follow    All of the above findings and recommendations were discussed with the patient, and the medical team, and all of patient's questions were answered to his expressed satisfaction.  -- Campbell Lerner, M.D., [facility]  07/16/2023 ; 1:46 PM

## 2023-07-16 NOTE — Progress Notes (Signed)
PROGRESS NOTE    Victor Phillips  WGN:[id number] DOB: [birth year] DOA: 07/14/2023 PCP: Danella Penton, MD   Brief Narrative: 67 year old with past medical history significant for insulin-dependent diabetes, hypertension, hyperlipidemia, history of multiple myeloma, A-fib on Eliquis presents complaining of intractable nausea and vomiting.  He was found to have hyponatremia sodium 132, tachycardic heart rate 116, lactic acid 4.1 subsequently increased to 6.7.  He received IV fluids.  CT abdomen and pelvis showed disproportionately dilated multiple small bowel loops which gradually tapers in the right lower quadrant without discrete point of transition.  Findings favor adynamic ileus.  Consider continue imaging follow-up to rule out developing a small bowel obstruction.  No suspicious mass or inflammatory changes.  Patient admitted for SBO versus small bowel obstruction.  Surgery was consulted.  NG tube was placed.  Assessment & Plan:   Principal Problem:   Ileus (HCC) Active Problems:   Type 2 diabetes mellitus (HCC)   Hypercholesteremia   Multiple myeloma (HCC)   Atrial fibrillation (HCC)   Thrombocytopenia (HCC)   Hypomagnesemia   Hypocalcemia   1-ileus versus early SBO: -Appreciate surgery evaluation and recommendations -NG tube in placed, 350 in 24 hours.  -IV fluids, as needed Phenergan -Needs to ambulate.  -K at 4.  -defer diet to sx/   Hypocalcemia in the setting of hypoalbuminemia: Corrected calcium 8.1. Received 2 g of IV calcium gluconate. Recheck albumin tomorrow.   Hypomagnesemia: Replaced  Thrombocytopenia: monitor. In setting of acute illness. Trending down,. Monitor   CKD stage II Previous cr range 1.1---1.6 Monitor renal function.   A-fib -Holding Eliquis in the setting of a small bowel obstruction versus ileus. -Follow surgery recommendation in regards anticoagulation Would hold anticoagulation due to thrombocytopenia as well.   Multiple  myeloma: -Continue follow-up with oncology outpatient  Hypercholesterolemia:  -Hold statins when able to tolerate oral  Diabetes type 2: -Hold oral hypoglycemic agents. -Sliding scale insulin as needed         Estimated body mass index is 27.57 kg/m as calculated from the following:   Height as of this encounter: 5\' 7"  (1.702 m).   Weight as of this encounter: 79.8 kg.   DVT prophylaxis: SCD Code Status: Full code Family Communication: Care discussed with patient.  Disposition Plan:  Status is: Inpatient Remains inpatient appropriate because: management of Ileus, Vs SBO    Consultants:  Surgery   Procedures:  none  Antimicrobials:    Subjective: He report having BM today. Less distended.    Objective: Vitals:   07/15/23 0816 07/15/23 1426 07/15/23 2032 07/16/23 0307  BP: 114/73 (!) 144/93 121/86 (!) 132/93  Pulse: (!) 102 97 (!) 50 97  Resp: 19 17 18 18   Temp: (!) 97.5 F (36.4 C) 97.6 F (36.4 C) (!) 97.5 F (36.4 C) (!) 97.5 F (36.4 C)  TempSrc:   Oral Oral  SpO2: 93% 100% 100% 95%  Weight:      Height:        Intake/Output Summary (Last 24 hours) at 07/16/2023 0800 Last data filed at 07/16/2023 7846 Gross per 24 hour  Intake 1633.38 ml  Output 750 ml  Net 883.38 ml   Filed Weights   07/14/23 0739  Weight: 79.8 kg    Examination:  General exam: NAD Respiratory system: CTA Cardiovascular system: S 1, S 2 RRR Gastrointestinal system: BS present, soft nt Central nervous system: alert Extremities: Symmetric 5 x 5 power.   Data Reviewed: I have personally reviewed following labs and imaging  studies  CBC: Recent Labs  Lab 07/14/23 0740 07/15/23 0630  WBC 9.4 7.3  NEUTROABS 8.8*  --   HGB 11.3* 10.2*  HCT 32.7* 30.5*  MCV 95.1 99.7  PLT 78* 60*   Basic Metabolic Panel: Recent Labs  Lab 07/14/23 0956 07/15/23 0630 07/15/23 1836  NA 132* 138 136  K 3.6 4.3 4.4  CL 103 103 104  CO2 14* 23 25  GLUCOSE 138* 120* 83  BUN  29* 44* 42*  CREATININE 0.89 1.43* 1.26*  CALCIUM 6.4* 7.2* 6.9*  MG 1.3* 2.6* 3.1*   GFR: Estimated Creatinine Clearance: 59.2 mL/min (A) (by C-G formula based on SCr of 1.26 mg/dL (H)). Liver Function Tests: Recent Labs  Lab 07/14/23 0956 07/15/23 0630  AST 20 76*  ALT 17 39  ALKPHOS 41 60  BILITOT 0.6 1.0  PROT 3.7* 6.3*  ALBUMIN 1.9* 3.2*   Recent Labs  Lab 07/14/23 0956  LIPASE 21   No results for input(s): "AMMONIA" in the last 168 hours. Coagulation Profile: Recent Labs  Lab 07/14/23 0740  INR 1.3*   Cardiac Enzymes: No results for input(s): "CKTOTAL", "CKMB", "CKMBINDEX", "TROPONINI" in the last 168 hours. BNP (last 3 results) No results for input(s): "PROBNP" in the last 8760 hours. HbA1C: No results for input(s): "HGBA1C" in the last 72 hours. CBG: Recent Labs  Lab 07/15/23 1717 07/15/23 2134 07/15/23 2207 07/16/23 0013 07/16/23 0353  GLUCAP 83 64* 86 72 85   Lipid Profile: No results for input(s): "CHOL", "HDL", "LDLCALC", "TRIG", "CHOLHDL", "LDLDIRECT" in the last 72 hours. Thyroid Function Tests: No results for input(s): "TSH", "T4TOTAL", "FREET4", "T3FREE", "THYROIDAB" in the last 72 hours. Anemia Panel: No results for input(s): "VITAMINB12", "FOLATE", "FERRITIN", "TIBC", "IRON", "RETICCTPCT" in the last 72 hours. Sepsis Labs: Recent Labs  Lab 07/14/23 0740 07/14/23 0956 07/14/23 1459 07/14/23 1635  LATICACIDVEN 4.1* 6.7* 1.3 1.5    Recent Results (from the past 240 hour(s))  Blood Culture (routine x 2)     Status: None (Preliminary result)   Collection Time: 07/14/23  7:40 AM   Specimen: BLOOD  Result Value Ref Range Status   Specimen Description BLOOD LEFT FOREARM  Final   Special Requests   Final    BOTTLES DRAWN AEROBIC AND ANAEROBIC Blood Culture adequate volume   Culture   Final    NO GROWTH 2 DAYS Performed at [name], [address] [place], [place] [address]    Report Status PENDING  Incomplete  Blood  Culture (routine x 2)     Status: None (Preliminary result)   Collection Time: 07/14/23  8:30 AM   Specimen: BLOOD  Result Value Ref Range Status   Specimen Description BLOOD BLOOD LEFT FOREARM  Final   Special Requests   Final    BOTTLES DRAWN AEROBIC AND ANAEROBIC Blood Culture results may not be optimal due to an excessive volume of blood received in culture bottles   Culture   Final    NO GROWTH 2 DAYS Performed at [facility], [address], [place], [place] [address]    Report Status PENDING  Incomplete         Radiology Studies: DG Abd Portable 1V  Result Date: 07/15/2023 CLINICAL DATA:  NG tube placement EXAM: PORTABLE ABDOMEN - 1 VIEW COMPARISON:  CT 07/14/2023 FINDINGS: Esophageal tube tip in the distal stomach. Dilated bowel in the upper abdomen IMPRESSION: Esophageal tube tip in the distal stomach. Electronically Signed   By: Adrian Prows.D.  On: 07/15/2023 00:46   CT ABDOMEN PELVIS W CONTRAST  Result Date: 07/14/2023 CLINICAL DATA:  Bowel obstruction suspected. Nausea and vomiting. Right leg pain. EXAM: CT ABDOMEN AND PELVIS WITH CONTRAST TECHNIQUE: Multidetector CT imaging of the abdomen and pelvis was performed using the standard protocol following bolus administration of intravenous contrast. RADIATION DOSE REDUCTION: This exam was performed according to the departmental dose-optimization program which includes automated exposure control, adjustment of the mA and/or kV according to patient size and/or use of iterative reconstruction technique. CONTRAST:  OMNIPAQUE IOHEXOL 300 MG/ML  SOLN COMPARISON:  CT scan abdomen and pelvis from 02/11/2023. FINDINGS: Lower chest: The lung bases are clear. No pleural effusion. The heart is normal in size. No pericardial effusion. Hepatobiliary: The liver is normal in size. Non-cirrhotic configuration. No suspicious mass. These is mild diffuse hepatic steatosis. No intrahepatic or extrahepatic bile duct dilation. No  calcified gallstones. Normal gallbladder wall thickness. No pericholecystic inflammatory changes. Pancreas: Unremarkable. No pancreatic ductal dilatation or surrounding inflammatory changes. Spleen: Within normal limits. No focal lesion. Adrenals/Urinary Tract: Adrenal glands are unremarkable. No suspicious renal mass. There are at least 2 subcentimeter sized simple cysts in the right kidney. No hydronephrosis. No renal or ureteric calculi. Unremarkable urinary bladder. Stomach/Bowel: There are multiple loops of small bowel which are dilated measuring up to 3.4 cm in diameter. The gradually taper in the right lower quadrant (series 6, image 33). No point of transition seen. Several distal ileal loops are nondilated. The colon contains moderate amount of stool burden without abnormal dilation. A normal appendix is not seen and may be surgically absent. No inflammatory changes in the right lower quadrant. No abnormal bowel wall thickening or surrounding fat stranding. Vascular/Lymphatic: No ascites or pneumoperitoneum. No abdominal or pelvic lymphadenopathy, by size criteria. No aneurysmal dilation of the major abdominal arteries. There are moderate peripheral atherosclerotic vascular calcifications of the aorta and its major branches. Reproductive: Normal size prostate. Symmetric seminal vesicles. Other: There are bilateral small fat containing inguinal hernias. The soft tissues and abdominal wall are otherwise unremarkable. Musculoskeletal: No suspicious osseous lesions. There are mild multilevel degenerative changes in the visualized spine. IMPRESSION: *There are disproportionately dilated multiple small bowel loops which gradually tapers in the right lower quadrant without discrete point of transition. Findings favor adynamic ileus. Correlate clinically. Consider continued radiological follow-up to exclude developing small bowel obstruction. No suspicious mass or inflammatory changes noted. No pneumoperitoneum,  ascites, pneumatosis or portal venous gas. *Multiple other nonacute observations, as described above. Electronically Signed   By: Jules Schick M.D.   On: 07/14/2023 13:38   CT Head Wo Contrast  Result Date: 07/14/2023 CLINICAL DATA:  Provided history: Transient ischemic attack (TIA). Nausea. Vomiting. EXAM: CT HEAD WITHOUT CONTRAST TECHNIQUE: Contiguous axial images were obtained from the base of the skull through the vertex without intravenous contrast. RADIATION DOSE REDUCTION: This exam was performed according to the departmental dose-optimization program which includes automated exposure control, adjustment of the mA and/or kV according to patient size and/or use of iterative reconstruction technique. COMPARISON:  Head CT 03/11/2023. FINDINGS: Brain: No age advanced or lobar predominant parenchymal atrophy. There is no acute intracranial hemorrhage. No demarcated cortical infarct. No extra-axial fluid collection. No evidence of an intracranial mass. No midline shift. Vascular: No hyperdense vessel.  Atherosclerotic calcifications. Skull: No calvarial fracture or aggressive osseous lesion. Sinuses/Orbits: No mass or acute finding within the imaged orbits. Minimal mucosal thickening within the bilateral ethmoid sinuses. Minimal mucosal thickening, and tiny mucous retention cyst,  within the left sphenoid sinus. IMPRESSION: 1. No evidence of an acute intracranial abnormality. 2. Minor paranasal sinus disease as described. Electronically Signed   By: Jackey Loge D.O.   On: 07/14/2023 12:35        Scheduled Meds:  insulin aspart  0-15 Units Subcutaneous TID WC   insulin aspart  0-5 Units Subcutaneous QHS   lidocaine  1 Application Other Once   oxymetazoline  1 spray Each Nare Once   pantoprazole (PROTONIX) IV  40 mg Intravenous Q24H   Continuous Infusions:  dextrose 5% lactated ringers 100 mL/hr at 07/16/23 0110     LOS: 2 days    Time spent: 35 minutes    Ami Mally A Brooklynne Pereida, MD [facility]   If 7PM-7AM, please contact night-coverage [email or web address]  07/16/2023, 8:00 AM

## 2023-07-16 NOTE — Plan of Care (Signed)
  Problem: Elimination: Goal: Will not experience complications related to bowel motility 07/16/2023 0408 by Vallery Ridge, RN Outcome: Progressing 07/16/2023 0406 by Vallery Ridge, RN Outcome: Progressing Goal: Will not experience complications related to urinary retention 07/16/2023 0408 by Vallery Ridge, RN Outcome: Progressing 07/16/2023 0406 by Vallery Ridge, RN Outcome: Progressing   Problem: Education: Goal: Knowledge of General Education information will improve Description: Including pain rating scale, medication(s)/side effects and non-pharmacologic comfort measures 07/16/2023 0408 by Vallery Ridge, RN Outcome: Progressing 07/16/2023 0406 by Vallery Ridge, RN Outcome: Progressing

## 2023-07-16 NOTE — Plan of Care (Signed)
  Problem: Coping: Goal: Ability to adjust to condition or change in health will improve Outcome: Progressing   Problem: Fluid Volume: Goal: Ability to maintain a balanced intake and output will improve Outcome: Progressing   Problem: Pain Management: Goal: General experience of comfort will improve Outcome: Progressing

## 2023-07-17 ENCOUNTER — Inpatient Hospital Stay: Payer: Medicare HMO

## 2023-07-17 ENCOUNTER — Other Ambulatory Visit: Payer: Self-pay

## 2023-07-17 DIAGNOSIS — K567 Ileus, unspecified: Secondary | ICD-10-CM | POA: Diagnosis not present

## 2023-07-17 LAB — GLUCOSE, CAPILLARY
Glucose-Capillary: 124 mg/dL — ABNORMAL HIGH (ref 70–99)
Glucose-Capillary: 113 mg/dL — ABNORMAL HIGH (ref 70–99)
Glucose-Capillary: 76 mg/dL (ref 70–99)
Glucose-Capillary: 68 mg/dL — ABNORMAL LOW (ref 70–99)
Glucose-Capillary: 100 mg/dL — ABNORMAL HIGH (ref 70–99)
Glucose-Capillary: 148 mg/dL — ABNORMAL HIGH (ref 70–99)

## 2023-07-17 LAB — BASIC METABOLIC PANEL
Anion gap: 7 (ref 5–15)
BUN: 24 mg/dL — ABNORMAL HIGH (ref 8–23)
CO2: 23 mmol/L (ref 22–32)
Calcium: 7.1 mg/dL — ABNORMAL LOW (ref 8.9–10.3)
Chloride: 108 mmol/L (ref 98–111)
Creatinine, Ser: 0.96 mg/dL (ref 0.61–1.24)
GFR, Estimated: >60 mL/min (ref >60)
Glucose, Bld: 132 mg/dL — ABNORMAL HIGH (ref 70–99)
Potassium: 4.9 mmol/L (ref 3.5–5.1)
Sodium: 138 mmol/L (ref 135–145)

## 2023-07-17 LAB — HEPATIC FUNCTION PANEL
ALT: 36 U/L (ref 0–44)
AST: 40 U/L (ref 15–41)
Albumin: 3 g/dL — ABNORMAL LOW (ref 3.5–5.0)
Alkaline Phosphatase: 55 U/L (ref 38–126)
Bilirubin, Direct: 0.2 mg/dL (ref 0.0–0.2)
Indirect Bilirubin: 1 mg/dL — ABNORMAL HIGH (ref 0.3–0.9)
Total Bilirubin: 1.2 mg/dL (ref 0.3–1.2)
Total Protein: 6 g/dL — ABNORMAL LOW (ref 6.5–8.1)

## 2023-07-17 LAB — CBC
HCT: 27.2 % — ABNORMAL LOW (ref 39.0–52.0)
Hemoglobin: 9.1 g/dL — ABNORMAL LOW (ref 13.0–17.0)
MCH: 33 pg (ref 26.0–34.0)
MCHC: 33.5 g/dL (ref 30.0–36.0)
MCV: 98.6 fL (ref 80.0–100.0)
Platelets: 47 10*3/uL — ABNORMAL LOW (ref 150–400)
RBC: 2.76 MIL/uL — ABNORMAL LOW (ref 4.22–5.81)
RDW: 14.7 % (ref 11.5–15.5)
WBC: 4.8 10*3/uL (ref 4.0–10.5)
nRBC: 0 % (ref 0.0–0.2)

## 2023-07-17 MED ORDER — LEVALBUTEROL HCL 0.63 MG/3ML IN NEBU
0.6300 mg | INHALATION_SOLUTION | Freq: Four times a day (QID) | RESPIRATORY_TRACT | Status: DC
  Administered 2023-07-17 – 2023-07-19 (×8): 0.63 mg via RESPIRATORY_TRACT
  Filled 2023-07-17 (×11): qty 3

## 2023-07-17 MED ORDER — BISACODYL 10 MG RE SUPP
10.0000 mg | Freq: Once | RECTAL | Status: AC
  Administered 2023-07-17: 10 mg via RECTAL
  Filled 2023-07-17: qty 1

## 2023-07-17 MED ORDER — FUROSEMIDE 10 MG/ML IJ SOLN
20.0000 mg | Freq: Once | INTRAMUSCULAR | Status: AC
  Administered 2023-07-17: 20 mg via INTRAVENOUS
  Filled 2023-07-17: qty 4

## 2023-07-17 MED ORDER — DEXTROSE-SODIUM CHLORIDE 5-0.9 % IV SOLN
INTRAVENOUS | Status: DC
  Administered 2023-07-17: 40 mL/h via INTRAVENOUS

## 2023-07-17 MED ORDER — METOPROLOL TARTRATE 5 MG/5ML IV SOLN: 2.5000 mg | Freq: Four times a day (QID) | INTRAVENOUS | Status: DC | PRN

## 2023-07-17 MED ORDER — BISACODYL 10 MG RE SUPP: 10.0000 mg | Freq: Every day | RECTAL | Status: DC | PRN

## 2023-07-17 MED ORDER — POLYETHYLENE GLYCOL 3350 17 GM/SCOOP PO POWD
1.0000 | Freq: Once | ORAL | Status: AC
  Administered 2023-07-17: 255 g via ORAL
  Filled 2023-07-17: qty 255

## 2023-07-17 MED ORDER — CALCIUM CARBONATE 1250 (500 CA) MG PO TABS
1.0000 | ORAL_TABLET | Freq: Two times a day (BID) | ORAL | Status: DC
  Administered 2023-07-17 – 2023-07-19 (×4): 1250 mg via ORAL
  Filled 2023-07-17 (×4): qty 1

## 2023-07-17 MED ORDER — GUAIFENESIN ER 600 MG PO TB12
600.0000 mg | ORAL_TABLET | Freq: Two times a day (BID) | ORAL | Status: DC
  Administered 2023-07-17 – 2023-07-19 (×5): 600 mg via ORAL
  Filled 2023-07-17 (×5): qty 1

## 2023-07-17 MED ORDER — IPRATROPIUM BROMIDE 0.02 % IN SOLN
0.5000 mg | Freq: Four times a day (QID) | RESPIRATORY_TRACT | Status: DC
  Administered 2023-07-17 – 2023-07-19 (×8): 0.5 mg via RESPIRATORY_TRACT
  Filled 2023-07-17 (×8): qty 2.5

## 2023-07-17 MED ORDER — DEXTROSE 50 % IV SOLN
25.0000 mL | Freq: Once | INTRAVENOUS | Status: DC
  Filled 2023-07-17: qty 50

## 2023-07-17 MED ORDER — FLEET ENEMA RE ENEM: 1.0000 | ENEMA | Freq: Every day | RECTAL | Status: DC | PRN

## 2023-07-17 MED ORDER — DILTIAZEM HCL ER COATED BEADS 120 MG PO CP24
120.0000 mg | ORAL_CAPSULE | Freq: Every day | ORAL | Status: DC
  Administered 2023-07-17 – 2023-07-19 (×3): 120 mg via ORAL
  Filled 2023-07-17 (×3): qty 1

## 2023-07-17 MED ORDER — SIMETHICONE 80 MG PO CHEW
80.0000 mg | CHEWABLE_TABLET | Freq: Four times a day (QID) | ORAL | Status: DC
  Administered 2023-07-17 – 2023-07-19 (×6): 80 mg via ORAL
  Filled 2023-07-17 (×7): qty 1

## 2023-07-17 NOTE — Plan of Care (Signed)

## 2023-07-17 NOTE — Progress Notes (Signed)
   07/17/23 0852  Assess: MEWS Score  Temp 98.2 F (36.8 C)  BP (!) 154/95  MAP (mmHg) 113  Pulse Rate (!) 108  ECG Heart Rate (!) 107  Resp (!) 22  SpO2 99 %  O2 Device Room Air  Assess: MEWS Score  MEWS Temp 0  MEWS Systolic 0  MEWS Pulse 1  MEWS RR 1  MEWS LOC 0  MEWS Score 2  MEWS Score Color Yellow  Assess: if the MEWS score is Yellow or Red  Were vital signs accurate and taken at a resting state? No, vital signs rechecked  Assess: SIRS CRITERIA  SIRS Temperature  0  SIRS Pulse 1  SIRS Respirations  1  SIRS WBC 0  SIRS Score Sum  2

## 2023-07-17 NOTE — Progress Notes (Signed)
[facility] SURGICAL PROGRESS NOTE (cpt [phone])  Hospital Day(s): 3.   Interval History: Patient seen and examined. Overnight reports passing more flatus, stable abdominal distention back to baseline.  Reports voiding without difficulty.  He has tolerated clear liquids so far  Review of Systems:  Constitutional: denies fever, chills  HEENT: denies cough or congestion  Respiratory: denies any shortness of breath  Cardiovascular: denies chest pain or palpitations  Gastrointestinal: denies abdominal pain, N/V Genitourinary: denies burning with urination or urinary frequency Musculoskeletal: denies pain, decreased motor or sensation  Vital signs in last 24 hours: [min-max] current  Temp:  [97.6 F (36.4 C)-98.2 F (36.8 C)] 98.2 F (36.8 C) (11/03 0852) Pulse Rate:  [91-109] 100 (11/03 0949) Resp:  [18-22] 22 (11/03 0852) BP: (106-154)/(79-95) 144/94 (11/03 1128) SpO2:  [97 %-100 %] 98 % (11/03 1423)     Height: 5\' 7"  (170.2 cm) Weight: 79.8 kg BMI (Calculated): 27.56   Intake/Output last 2 shifts:  11/02 0701 - 11/03 0700 In: 1066.2 [I.V.:1066.2] Out: -    Physical Exam:  Constitutional: alert, cooperative and no distress  HENT: normocephalic without obvious abnormality; NGT in place; 70 cm at nare; bilious  Eyes: PERRL, EOM's grossly intact and symmetric  Respiratory: breathing non-labored at rest  Cardiovascular: regular rate and sinus rhythm  Gastrointestinal: soft, non-tender, tympanic, no rebound/guarding  Musculoskeletal: no edema or wounds, motor and sensation grossly intact, NT    Labs:     Latest Ref Rng & Units 07/17/2023    5:05 AM 07/16/2023    9:04 AM 07/15/2023    6:30 AM  CBC  WBC 4.0 - 10.5 K/uL 4.8  6.0  7.3   Hemoglobin 13.0 - 17.0 g/dL 9.1  9.4  60.4   Hematocrit 39.0 - 52.0 % 27.2  27.5  30.5   Platelets 150 - 400 K/uL 47  48  60       Latest Ref Rng & Units 07/17/2023    5:05 AM 07/16/2023    9:04 AM 07/15/2023    6:36 PM  CMP   Glucose 70 - 99 mg/dL 540  981  83   BUN 8 - 23 mg/dL 24  33  42   Creatinine 0.61 - 1.24 mg/dL 1.91  4.78  2.95   Sodium 135 - 145 mmol/L 138  136  136   Potassium 3.5 - 5.1 mmol/L 4.9  4.4  4.4   Chloride 98 - 111 mmol/L 108  104  104   CO2 22 - 32 mmol/L 23  22  25    Calcium 8.9 - 10.3 mg/dL 7.1  6.9  6.9   Total Protein 6.5 - 8.1 g/dL 6.0     Total Bilirubin 0.3 - 1.2 mg/dL 1.2     Alkaline Phos 38 - 126 U/L 55     AST 15 - 41 U/L 40     ALT 0 - 44 U/L 36          Assessment/Plan: (ICD-10's: K56.7) 67 y.o. male presenting with nausea/emesis found to have dilated loops of small bowel with gradual taper concerning for possible ileus vs SBO, complicated by hypocalcemia, Hx of multiple myeloma, PAF on Eliquis    -Continue clears, consider fulls for supper should he continue to tolerate well  - No need for surgical intervention - Monitor abdominal examination; on-going bowel function - Serial KUB as needed   - Mobilize   - Further management per primary service; we will follow    All  of the above findings and recommendations were discussed with the patient, and the medical team, and all of patient's questions were answered to his expressed satisfaction.  -- Campbell Lerner, M.D., [facility]  07/17/2023 ; 3:26 PM

## 2023-07-17 NOTE — Progress Notes (Addendum)
   07/17/23 0843  NG/OG Vented/Dual Lumen 14 Fr. Right nare Marking at nare/corner of mouth 65 cm  Placement Date/Time: 07/14/23 2330   Person Inserting LDA: Christin, RN  Tube Size (Fr.): 14 Fr.  Tube Location: Right nare  Secured by: Bridle  Initial Placement Verification: Xray  Tube Position (Required): Marking at nare/corner of mouth  Measureme...  Tube Position (Required) Marking at nare/corner of mouth  Measurement (cm) (Required) (S)  59 cm  Ongoing Placement Verification (Required) (See row information) Yes  Site Assessment Clean, Dry, Intact  Interventions Clamped     Surgical team informed.  See new orders.

## 2023-07-17 NOTE — Progress Notes (Addendum)
   07/17/23 1000  Assess: MEWS Score  ECG Heart Rate (!) 115  Assess: MEWS Score  MEWS Temp 0  MEWS Systolic 0  MEWS Pulse 2  MEWS RR 1  MEWS LOC 0  MEWS Score 3  MEWS Score Color Yellow  Assess: SIRS CRITERIA  SIRS Temperature  0  SIRS Pulse 1  SIRS Respirations  1  SIRS WBC 0  SIRS Score Sum  2    Primary team informed. See new orders.

## 2023-07-17 NOTE — Plan of Care (Signed)
  Problem: Education: Goal: Ability to describe self-care measures that may prevent or decrease complications (Diabetes Survival Skills Education) will improve Outcome: Progressing   Problem: Coping: Goal: Ability to adjust to condition or change in health will improve Outcome: Progressing   Problem: Fluid Volume: Goal: Ability to maintain a balanced intake and output will improve Outcome: Progressing   Problem: Health Behavior/Discharge Planning: Goal: Ability to identify and utilize available resources and services will improve Outcome: Progressing Goal: Ability to manage health-related needs will improve Outcome: Progressing   Problem: Metabolic: Goal: Ability to maintain appropriate glucose levels will improve Outcome: Progressing   Problem: Nutritional: Goal: Maintenance of adequate nutrition will improve Outcome: Progressing   Problem: Skin Integrity: Goal: Risk for impaired skin integrity will decrease Outcome: Progressing   Problem: Tissue Perfusion: Goal: Adequacy of tissue perfusion will improve Outcome: Progressing   Problem: Health Behavior/Discharge Planning: Goal: Ability to manage health-related needs will improve Outcome: Progressing   Problem: Clinical Measurements: Goal: Ability to maintain clinical measurements within normal limits will improve Outcome: Progressing Goal: Will remain free from infection Outcome: Progressing Goal: Diagnostic test results will improve Outcome: Progressing   Problem: Activity: Goal: Risk for activity intolerance will decrease Outcome: Progressing   Problem: Elimination: Goal: Will not experience complications related to bowel motility Outcome: Progressing Goal: Will not experience complications related to urinary retention Outcome: Progressing   Problem: Pain Management: Goal: General experience of comfort will improve Outcome: Progressing   Problem: Safety: Goal: Ability to remain free from injury will  improve Outcome: Progressing   Problem: Skin Integrity: Goal: Risk for impaired skin integrity will decrease Outcome: Progressing

## 2023-07-17 NOTE — Progress Notes (Signed)
PROGRESS NOTE    Victor Phillips  JXB:[id number] DOB: 30-Oct-1955 DOA: 07/14/2023 PCP: Danella Penton, MD   Brief Narrative: 67 year old with past medical history significant for insulin-dependent diabetes, hypertension, hyperlipidemia, history of multiple myeloma, A-fib on Eliquis presents complaining of intractable nausea and vomiting.  He was found to have hyponatremia sodium 132, tachycardic heart rate 116, lactic acid 4.1 subsequently increased to 6.7.  He received IV fluids.  CT abdomen and pelvis showed disproportionately dilated multiple small bowel loops which gradually tapers in the right lower quadrant without discrete point of transition.  Findings favor adynamic ileus.  Consider continue imaging follow-up to rule out developing a small bowel obstruction.  No suspicious mass or inflammatory changes.  Patient admitted for SBO versus small bowel obstruction.  Surgery was consulted.  NG tube was placed.  Assessment & Plan:   Principal Problem:   Ileus (HCC) Active Problems:   Type 2 diabetes mellitus (HCC)   Hypercholesteremia   Multiple myeloma (HCC)   Atrial fibrillation (HCC)   Thrombocytopenia (HCC)   Hypomagnesemia   Hypocalcemia   1-Ileus versus early SBO: -Appreciate surgery evaluation and recommendations -NG tube in placed. Has been clamp.  -Stop Fluids. as needed Phenergan -Needs to ambulate.  -K at 4.  -plan to start clear liquid diet   Hypocalcemia in the setting of hypoalbuminemia: Corrected calcium 8.1. Received 2 g of IV calcium gluconate. Start oral supplement.   Respiratory:  Congested. Chest x ray Mild vascular congestion without edema. Tiny bilateral pleural effusions. Duo-neb Start Guaifenesin.  Stop IV fluids.   Hypomagnesemia: Replaced  Thrombocytopenia: monitor. In setting of acute illness.  Stable at 47 today.   CKD stage II Previous cr range 1.1---1.6 Monitor renal function.   A-fib RVR  -Holding Eliquis in the setting of a  small bowel obstruction versus ileus. -Follow surgery recommendation in regards anticoagulation -Would hold anticoagulation due to thrombocytopenia as well.  -HR- increased 110. Resume Cardizem.   Multiple myeloma: -Continue follow-up with oncology outpatient  Hypercholesterolemia:  -Hold statins when able to tolerate oral  Diabetes type 2: -Hold oral hypoglycemic agents. -Sliding scale insulin as needed         Estimated body mass index is 27.57 kg/m as calculated from the following:   Height as of this encounter: 5\' 7"  (1.702 m).   Weight as of this encounter: 79.8 kg.   DVT prophylaxis: SCD Code Status: Full code Family Communication: Care discussed with patient.  Disposition Plan:  Status is: Inpatient Remains inpatient appropriate because: management of Ileus, Vs SBO    Consultants:  Surgery   Procedures:  none  Antimicrobials:    Subjective: He had BM today. NG tube clamp since yesterday.  Congested  Objective: Vitals:   07/17/23 0852 07/17/23 0948 07/17/23 0949 07/17/23 1128  BP: (!) 154/95 (!) 144/94 (!) 144/94 (!) 144/94  Pulse: (!) 108 92 100   Resp: (!) 22     Temp: 98.2 F (36.8 C)     TempSrc: Oral     SpO2: 99%  99%   Weight:      Height:        Intake/Output Summary (Last 24 hours) at 07/17/2023 1256 Last data filed at 07/17/2023 1027 Gross per 24 hour  Intake 1603.29 ml  Output --  Net 1603.29 ml   Filed Weights   07/14/23 0739  Weight: 79.8 kg    Examination:  General exam: NAD Respiratory system: BL ronchus Cardiovascular system: S,1  S 2  RRR  Gastrointestinal system: BS present, soft, nt Central nervous system: Alert Extremities: Symmetric 5 x 5 power.   Data Reviewed: I have personally reviewed following labs and imaging studies  CBC: Recent Labs  Lab 07/14/23 0740 07/15/23 0630 07/16/23 0904 07/17/23 0505  WBC 9.4 7.3 6.0 4.8  NEUTROABS 8.8*  --   --   --   HGB 11.3* 10.2* 9.4* 9.1*  HCT 32.7* 30.5*  27.5* 27.2*  MCV 95.1 99.7 96.2 98.6  PLT 78* 60* 48* 47*   Basic Metabolic Panel: Recent Labs  Lab 07/14/23 0956 07/15/23 0630 07/15/23 1836 07/16/23 0904 07/17/23 0505  NA 132* 138 136 136 138  K 3.6 4.3 4.4 4.4 4.9  CL 103 103 104 104 108  CO2 14* 23 25 22 23   GLUCOSE 138* 120* 83 123* 132*  BUN 29* 44* 42* 33* 24*  CREATININE 0.89 1.43* 1.26* 1.00 0.96  CALCIUM 6.4* 7.2* 6.9* 6.9* 7.1*  MG 1.3* 2.6* 3.1*  --   --    GFR: Estimated Creatinine Clearance: 77.7 mL/min (by C-G formula based on SCr of 0.96 mg/dL). Liver Function Tests: Recent Labs  Lab 07/14/23 0956 07/15/23 0630 07/17/23 0505  AST 20 76* 40  ALT 17 39 36  ALKPHOS 41 60 55  BILITOT 0.6 1.0 1.2  PROT 3.7* 6.3* 6.0*  ALBUMIN 1.9* 3.2* 3.0*   Recent Labs  Lab 07/14/23 0956  LIPASE 21   No results for input(s): "AMMONIA" in the last 168 hours. Coagulation Profile: Recent Labs  Lab 07/14/23 0740  INR 1.3*   Cardiac Enzymes: No results for input(s): "CKTOTAL", "CKMB", "CKMBINDEX", "TROPONINI" in the last 168 hours. BNP (last 3 results) No results for input(s): "PROBNP" in the last 8760 hours. HbA1C: No results for input(s): "HGBA1C" in the last 72 hours. CBG: Recent Labs  Lab 07/16/23 2024 07/16/23 2349 07/17/23 0433 07/17/23 0849 07/17/23 1203  GLUCAP 97 99 124* 113* 76   Lipid Profile: No results for input(s): "CHOL", "HDL", "LDLCALC", "TRIG", "CHOLHDL", "LDLDIRECT" in the last 72 hours. Thyroid Function Tests: No results for input(s): "TSH", "T4TOTAL", "FREET4", "T3FREE", "THYROIDAB" in the last 72 hours. Anemia Panel: No results for input(s): "VITAMINB12", "FOLATE", "FERRITIN", "TIBC", "IRON", "RETICCTPCT" in the last 72 hours. Sepsis Labs: Recent Labs  Lab 07/14/23 0740 07/14/23 0956 07/14/23 1459 07/14/23 1635  LATICACIDVEN 4.1* 6.7* 1.3 1.5    Recent Results (from the past 240 hour(s))  Blood Culture (routine x 2)     Status: None (Preliminary result)   Collection  Time: 07/14/23  7:40 AM   Specimen: BLOOD  Result Value Ref Range Status   Specimen Description BLOOD LEFT FOREARM  Final   Special Requests   Final    BOTTLES DRAWN AEROBIC AND ANAEROBIC Blood Culture adequate volume   Culture   Final    NO GROWTH 3 DAYS Performed at [facility], [address] [place], [place] [address]    Report Status PENDING  Incomplete  Blood Culture (routine x 2)     Status: None (Preliminary result)   Collection Time: 07/14/23  8:30 AM   Specimen: BLOOD  Result Value Ref Range Status   Specimen Description BLOOD BLOOD LEFT FOREARM  Final   Special Requests   Final    BOTTLES DRAWN AEROBIC AND ANAEROBIC Blood Culture results may not be optimal due to an excessive volume of blood received in culture bottles   Culture   Final    NO GROWTH 3 DAYS Performed at [facility],  [address] [place], [place] [address]    Report Status PENDING  Incomplete         Radiology Studies: DG Chest 2 View  Result Date: 07/17/2023 CLINICAL DATA:  Shortness of breath. EXAM: CHEST - 2 VIEW COMPARISON:  07/14/2023 FINDINGS: Cardiopericardial silhouette is at upper limits of normal for size. Mild vascular congestion without edema. No focal consolidation or pneumothorax. Tiny bilateral pleural effusions evident. No acute bony abnormality. IMPRESSION: 1. Mild vascular congestion without edema. 2. Tiny bilateral pleural effusions. Electronically Signed   By: Kennith Center M.D.   On: 07/17/2023 11:30        Scheduled Meds:  diltiazem  120 mg Oral Daily   guaiFENesin  600 mg Oral BID   insulin aspart  0-15 Units Subcutaneous TID WC   insulin aspart  0-5 Units Subcutaneous QHS   ipratropium  0.5 mg Nebulization Q6H   levalbuterol  0.63 mg Nebulization Q6H   lidocaine  1 Application Other Once   oxymetazoline  1 spray Each Nare Once   pantoprazole (PROTONIX) IV  40 mg Intravenous Q24H   Continuous Infusions:     LOS: 3 days    Time spent: 35  minutes    Crist Kruszka A Tyreshia Ingman, MD [facility]   If 7PM-7AM, please contact night-coverage [email or web address]  07/17/2023, 12:56 PM

## 2023-07-17 NOTE — Progress Notes (Signed)
Hypoglycemic Event  CBG: 68  Treatment: 8 oz juice/soda  Symptoms: None  Follow-up CBG: Time: 1800 CBG Result:100  Possible Reasons for Event: Inadequate meal intake and Medication regimen: fluids were discontinued, diet order modified.   Comments/MD notified: Primary team notified, see new orders.     Silverio Decamp RN

## 2023-07-17 NOTE — Progress Notes (Signed)
Patient returned from X-ray 

## 2023-07-18 ENCOUNTER — Other Ambulatory Visit: Payer: Self-pay

## 2023-07-18 ENCOUNTER — Encounter: Payer: Self-pay | Admitting: Internal Medicine

## 2023-07-18 DIAGNOSIS — K567 Ileus, unspecified: Secondary | ICD-10-CM | POA: Diagnosis not present

## 2023-07-18 DIAGNOSIS — C9000 Multiple myeloma not having achieved remission: Secondary | ICD-10-CM

## 2023-07-18 LAB — GLUCOSE, CAPILLARY
Glucose-Capillary: 105 mg/dL — ABNORMAL HIGH (ref 70–99)
Glucose-Capillary: 116 mg/dL — ABNORMAL HIGH (ref 70–99)
Glucose-Capillary: 117 mg/dL — ABNORMAL HIGH (ref 70–99)
Glucose-Capillary: 150 mg/dL — ABNORMAL HIGH (ref 70–99)
Glucose-Capillary: 79 mg/dL (ref 70–99)
Glucose-Capillary: 83 mg/dL (ref 70–99)
Glucose-Capillary: 98 mg/dL (ref 70–99)

## 2023-07-18 LAB — BASIC METABOLIC PANEL
Anion gap: 8 (ref 5–15)
BUN: 19 mg/dL (ref 8–23)
CO2: 22 mmol/L (ref 22–32)
Calcium: 6.9 mg/dL — ABNORMAL LOW (ref 8.9–10.3)
Chloride: 104 mmol/L (ref 98–111)
Creatinine, Ser: 1.01 mg/dL (ref 0.61–1.24)
GFR, Estimated: >60 mL/min (ref >60)
Glucose, Bld: 153 mg/dL — ABNORMAL HIGH (ref 70–99)
Potassium: 4.8 mmol/L (ref 3.5–5.1)
Sodium: 134 mmol/L — ABNORMAL LOW (ref 135–145)

## 2023-07-18 LAB — CBC
HCT: 26.5 % — ABNORMAL LOW (ref 39.0–52.0)
Hemoglobin: 9 g/dL — ABNORMAL LOW (ref 13.0–17.0)
MCH: 33.1 pg (ref 26.0–34.0)
MCHC: 34 g/dL (ref 30.0–36.0)
MCV: 97.4 fL (ref 80.0–100.0)
Platelets: 54 10*3/uL — ABNORMAL LOW (ref 150–400)
RBC: 2.72 MIL/uL — ABNORMAL LOW (ref 4.22–5.81)
RDW: 14.7 % (ref 11.5–15.5)
WBC: 4.7 10*3/uL (ref 4.0–10.5)
nRBC: 0 % (ref 0.0–0.2)

## 2023-07-18 MED ORDER — LENALIDOMIDE 10 MG PO CAPS: 10.0000 mg | ORAL_CAPSULE | Freq: Every day | ORAL | 0 refills | Status: DC

## 2023-07-18 NOTE — Progress Notes (Signed)
CC: Ileus Subjective: Doing well this morning. Tolerated CLD. Says his abdomen is normal size. No nausea or vomiting. Having loose stools.   Objective: Vital signs in last 24 hours: Temp:  [96.5 F (35.8 C)-97.8 F (36.6 C)] 97.6 F (36.4 C) (11/04 0921) Pulse Rate:  [87-110] 101 (11/04 0921) Resp:  [16-20] 18 (11/04 0921) BP: (103-144)/(64-94) 137/89 (11/04 0921) SpO2:  [96 %-100 %] 100 % (11/04 0921) Last BM Date : 07/18/23  Intake/Output from previous day: 11/03 0701 - 11/04 0700 In: 2337.1 [P.O.:1800; I.V.:537.1] Out: -  Intake/Output this shift: No intake/output data recorded.  Physical exam:  Abdomen is protuberant, non-tender, softer than last week.   Lab Results: CBC  Recent Labs    07/17/23 0505 07/18/23 0817  WBC 4.8 4.7  HGB 9.1* 9.0*  HCT 27.2* 26.5*  PLT 47* 54*   BMET Recent Labs    07/17/23 0505 07/18/23 0817  NA 138 134*  K 4.9 4.8  CL 108 104  CO2 23 22  GLUCOSE 132* 153*  BUN 24* 19  CREATININE 0.96 1.01  CALCIUM 7.1* 6.9*   PT/INR No results for input(s): "LABPROT", "INR" in the last 72 hours. ABG No results for input(s): "PHART", "HCO3" in the last 72 hours.  Invalid input(s): "PCO2", "PO2"  Studies/Results: DG Abd 1 View  Result Date: 07/17/2023 CLINICAL DATA:  Distension EXAM: ABDOMEN - 1 VIEW COMPARISON:  07/14/2023, CT 07/14/2023 FINDINGS: Removal of esophageal tube.persistent gaseous dilatation of right abdominal small bowel loops measuring up to 3.5 cm with mild diffuse air distension of the colon. No radiopaque calculi. IMPRESSION: Persistent gaseous dilatation of right abdominal small bowel loops with mild diffuse air distension of the colon. Findings could be secondary to ileus or partial obstruction. Electronically Signed   By: Jasmine Pang M.D.   On: 07/17/2023 19:15   DG Chest 2 View  Result Date: 07/17/2023 CLINICAL DATA:  Shortness of breath. EXAM: CHEST - 2 VIEW COMPARISON:  07/14/2023 FINDINGS: Cardiopericardial  silhouette is at upper limits of normal for size. Mild vascular congestion without edema. No focal consolidation or pneumothorax. Tiny bilateral pleural effusions evident. No acute bony abnormality. IMPRESSION: 1. Mild vascular congestion without edema. 2. Tiny bilateral pleural effusions. Electronically Signed   By: Kennith Center M.D.   On: 07/17/2023 11:30    Anti-infectives: Anti-infectives (From admission, onward)    None       Assessment/Plan:  Patient with ileus likely secondary to electrolyte distrubances. Reviewed x-ray there are some bowel loops with gaseous distention but air and distention of colon. Doing better now and having bowel function. Advance to soft diet today. Needs to mobilize. If tolerates this hopefully home this PM versus tomorrow.   Baker Pierini, M.D. [facility]

## 2023-07-18 NOTE — Plan of Care (Signed)
  Problem: Fluid Volume: Goal: Ability to maintain a balanced intake and output will improve Outcome: Progressing   Problem: Health Behavior/Discharge Planning: Goal: Ability to identify and utilize available resources and services will improve Outcome: Progressing Goal: Ability to manage health-related needs will improve Outcome: Progressing   Problem: Health Behavior/Discharge Planning: Goal: Ability to manage health-related needs will improve Outcome: Progressing   Problem: Clinical Measurements: Goal: Ability to maintain clinical measurements within normal limits will improve Outcome: Progressing Goal: Will remain free from infection Outcome: Progressing Goal: Diagnostic test results will improve Outcome: Progressing Goal: Respiratory complications will improve Outcome: Progressing Goal: Cardiovascular complication will be avoided Outcome: Progressing   Problem: Activity: Goal: Risk for activity intolerance will decrease Outcome: Progressing   Problem: Safety: Goal: Ability to remain free from injury will improve Outcome: Progressing

## 2023-07-18 NOTE — Progress Notes (Signed)
PROGRESS NOTE    Victor Phillips  ZHY:[id number] DOB: 11-May-1956 DOA: 07/14/2023 PCP: Danella Penton, MD   Brief Narrative: 67 year old with past medical history significant for insulin-dependent diabetes, hypertension, hyperlipidemia, history of multiple myeloma, A-fib on Eliquis presents complaining of intractable nausea and vomiting.  He was found to have hyponatremia sodium 132, tachycardic heart rate 116, lactic acid 4.1 subsequently increased to 6.7.  He received IV fluids.  CT abdomen and pelvis showed disproportionately dilated multiple small bowel loops which gradually tapers in the right lower quadrant without discrete point of transition.  Findings favor adynamic ileus.  Consider continue imaging follow-up to rule out developing a small bowel obstruction.  No suspicious mass or inflammatory changes.  Patient admitted for SBO versus small bowel obstruction.  Surgery was consulted.  NG tube was placed.  Assessment & Plan:   Principal Problem:   Ileus (HCC) Active Problems:   Type 2 diabetes mellitus (HCC)   Hypercholesteremia   Multiple myeloma (HCC)   Atrial fibrillation (HCC)   Thrombocytopenia (HCC)   Hypomagnesemia   Hypocalcemia   1-Ileus versus early SBO: -Appreciate surgery evaluation and recommendations -NG tube removed.  -Stop Fluids. as needed Phenergan -Needs to ambulate.  -K at 4.  Plan to advanced diet to soft, home tomorrow.   Hypocalcemia in the setting of hypoalbuminemia: Corrected calcium 8.1. Received 2 g of IV calcium gluconate. Started oral supplement.   Respiratory:  Congested. Chest x ray Mild vascular congestion without edema. Tiny bilateral pleural effusions. Duo-neb Started Guaifenesin.  Stop IV fluids.   Hypomagnesemia: Replaced  Thrombocytopenia: monitor. In setting of acute illness.  Platelet increasing 54 if remain stable will resume elquis tomorrow.   CKD stage II Previous cr range 1.1---1.6 Monitor renal function.   A-fib  RVR  -Holding Eliquis in the setting of a small bowel obstruction versus ileus. -Follow surgery recommendation in regards anticoagulation -Would hold anticoagulation due to thrombocytopenia as well.  -Continue  Cardizem.  -resume Eliquis tomorrow if platelet remain above 50  Multiple myeloma: -Continue follow-up with oncology outpatient  Hypercholesterolemia:  -Hold statins when able to tolerate oral  Diabetes type 2: -Hold oral hypoglycemic agents. -Sliding scale insulin as needed         Estimated body mass index is 27.57 kg/m as calculated from the following:   Height as of this encounter: 5\' 7"  (1.702 m).   Weight as of this encounter: 79.8 kg.   DVT prophylaxis: SCD Code Status: Full code Family Communication: Care discussed with patient.  Disposition Plan:  Status is: Inpatient Remains inpatient appropriate because: management of Ileus, Vs SBO    Consultants:  Surgery   Procedures:  none  Antimicrobials:    Subjective: Gees better, denies dyspnea. Mild congestion.  Abdominal distension resolved. After miralax yesterday had BM  Objective: Vitals:   07/18/23 0258 07/18/23 0323 07/18/23 0921 07/18/23 0925  BP:  106/64 137/89 137/89  Pulse:  100 (!) 101   Resp:  18 18   Temp:  (!) 97.5 F (36.4 C) 97.6 F (36.4 C)   TempSrc:  Axillary Oral   SpO2: 98% 100% 100%   Weight:      Height:        Intake/Output Summary (Last 24 hours) at 07/18/2023 1316 Last data filed at 07/17/2023 2130 Gross per 24 hour  Intake 1800 ml  Output --  Net 1800 ml   Filed Weights   07/14/23 0739  Weight: 79.8 kg    Examination:  General exam:  NAD Respiratory system: BL ronchus Cardiovascular system: S 1, S 2 RRR Gastrointestinal system: BS present, soft, nt Extremities: No edema   Data Reviewed: I have personally reviewed following labs and imaging studies  CBC: Recent Labs  Lab 07/14/23 0740 07/15/23 0630 07/16/23 0904 07/17/23 0505 07/18/23 0817   WBC 9.4 7.3 6.0 4.8 4.7  NEUTROABS 8.8*  --   --   --   --   HGB 11.3* 10.2* 9.4* 9.1* 9.0*  HCT 32.7* 30.5* 27.5* 27.2* 26.5*  MCV 95.1 99.7 96.2 98.6 97.4  PLT 78* 60* 48* 47* 54*   Basic Metabolic Panel: Recent Labs  Lab 07/14/23 0956 07/15/23 0630 07/15/23 1836 07/16/23 0904 07/17/23 0505 07/18/23 0817  NA 132* 138 136 136 138 134*  K 3.6 4.3 4.4 4.4 4.9 4.8  CL 103 103 104 104 108 104  CO2 14* 23 25 22 23 22   GLUCOSE 138* 120* 83 123* 132* 153*  BUN 29* 44* 42* 33* 24* 19  CREATININE 0.89 1.43* 1.26* 1.00 0.96 1.01  CALCIUM 6.4* 7.2* 6.9* 6.9* 7.1* 6.9*  MG 1.3* 2.6* 3.1*  --   --   --    GFR: Estimated Creatinine Clearance: 73.8 mL/min (by C-G formula based on SCr of 1.01 mg/dL). Liver Function Tests: Recent Labs  Lab 07/14/23 0956 07/15/23 0630 07/17/23 0505  AST 20 76* 40  ALT 17 39 36  ALKPHOS 41 60 55  BILITOT 0.6 1.0 1.2  PROT 3.7* 6.3* 6.0*  ALBUMIN 1.9* 3.2* 3.0*   Recent Labs  Lab 07/14/23 0956  LIPASE 21   No results for input(s): "AMMONIA" in the last 168 hours. Coagulation Profile: Recent Labs  Lab 07/14/23 0740  INR 1.3*   Cardiac Enzymes: No results for input(s): "CKTOTAL", "CKMB", "CKMBINDEX", "TROPONINI" in the last 168 hours. BNP (last 3 results) No results for input(s): "PROBNP" in the last 8760 hours. HbA1C: No results for input(s): "HGBA1C" in the last 72 hours. CBG: Recent Labs  Lab 07/17/23 2117 07/18/23 0026 07/18/23 0325 07/18/23 0908 07/18/23 1145  GLUCAP 148* 79 105* 150* 116*   Lipid Profile: No results for input(s): "CHOL", "HDL", "LDLCALC", "TRIG", "CHOLHDL", "LDLDIRECT" in the last 72 hours. Thyroid Function Tests: No results for input(s): "TSH", "T4TOTAL", "FREET4", "T3FREE", "THYROIDAB" in the last 72 hours. Anemia Panel: No results for input(s): "VITAMINB12", "FOLATE", "FERRITIN", "TIBC", "IRON", "RETICCTPCT" in the last 72 hours. Sepsis Labs: Recent Labs  Lab 07/14/23 0740 07/14/23 0956  07/14/23 1459 07/14/23 1635  LATICACIDVEN 4.1* 6.7* 1.3 1.5    Recent Results (from the past 240 hour(s))  Blood Culture (routine x 2)     Status: None (Preliminary result)   Collection Time: 07/14/23  7:40 AM   Specimen: BLOOD  Result Value Ref Range Status   Specimen Description BLOOD LEFT FOREARM  Final   Special Requests   Final    BOTTLES DRAWN AEROBIC AND ANAEROBIC Blood Culture adequate volume   Culture   Final    NO GROWTH 4 DAYS Performed at [facility], [address], [place], [place] [address]    Report Status PENDING  Incomplete  Blood Culture (routine x 2)     Status: None (Preliminary result)   Collection Time: 07/14/23  8:30 AM   Specimen: BLOOD  Result Value Ref Range Status   Specimen Description BLOOD BLOOD LEFT FOREARM  Final   Special Requests   Final    BOTTLES DRAWN AEROBIC AND ANAEROBIC Blood Culture results may not be optimal due  to an excessive volume of blood received in culture bottles   Culture   Final    NO GROWTH 4 DAYS Performed at [facility], [address] [place], [place] [address]    Report Status PENDING  Incomplete         Radiology Studies: DG Abd 1 View  Result Date: 07/17/2023 CLINICAL DATA:  Distension EXAM: ABDOMEN - 1 VIEW COMPARISON:  07/14/2023, CT 07/14/2023 FINDINGS: Removal of esophageal tube.persistent gaseous dilatation of right abdominal small bowel loops measuring up to 3.5 cm with mild diffuse air distension of the colon. No radiopaque calculi. IMPRESSION: Persistent gaseous dilatation of right abdominal small bowel loops with mild diffuse air distension of the colon. Findings could be secondary to ileus or partial obstruction. Electronically Signed   By: Jasmine Pang M.D.   On: 07/17/2023 19:15   DG Chest 2 View  Result Date: 07/17/2023 CLINICAL DATA:  Shortness of breath. EXAM: CHEST - 2 VIEW COMPARISON:  07/14/2023 FINDINGS: Cardiopericardial silhouette is at upper limits of normal for size.  Mild vascular congestion without edema. No focal consolidation or pneumothorax. Tiny bilateral pleural effusions evident. No acute bony abnormality. IMPRESSION: 1. Mild vascular congestion without edema. 2. Tiny bilateral pleural effusions. Electronically Signed   By: Kennith Center M.D.   On: 07/17/2023 11:30        Scheduled Meds:  calcium carbonate  1 tablet Oral BID WC   dextrose  25 mL Intravenous Once   diltiazem  120 mg Oral Daily   guaiFENesin  600 mg Oral BID   insulin aspart  0-15 Units Subcutaneous TID WC   insulin aspart  0-5 Units Subcutaneous QHS   ipratropium  0.5 mg Nebulization Q6H   levalbuterol  0.63 mg Nebulization Q6H   lidocaine  1 Application Other Once   oxymetazoline  1 spray Each Nare Once   pantoprazole (PROTONIX) IV  40 mg Intravenous Q24H   simethicone  80 mg Oral QID   Continuous Infusions:     LOS: 4 days    Time spent: 35 minutes    Cesario Weidinger A Lamorris Knoblock, MD [facility]   If 7PM-7AM, please contact night-coverage [email or web address]  07/18/2023, 1:16 PM

## 2023-07-18 NOTE — TOC Initial Note (Addendum)
Transition of Care [facility]) - Initial/Assessment Note    Patient Details  Name: Victor Phillips MRN: [id number] Date of Birth: [birth year]  Transition of Care [facility]) CM/SW Contact:    Margarito Liner, LCSW Phone Number: 07/18/2023, 11:27 AM  Clinical Narrative: Readmission prevention screen complete. CSW met with patient. No supports at bedside. CSW introduced role and explained that discharge planning would be discussed. PCP is Bethann Punches, MD. Wife drives him to appointments. [facility] is [facility] [address] [facility] [address] [facility]. No issues obtaining medications. Patient lives home with his wife. No home health or DME use prior to admission. He is requesting a RW. CSW sent secure chat to MD requesting order. No further concerns. CSW encouraged patient to contact CSW as needed. CSW will continue to follow patient for support and facilitate return home once stable. His wife or sister will transport him home at discharge.     12:49 pm: Ordered RW through [employer].              Expected Discharge Plan: Home/Self Care Barriers to Discharge: Continued Medical Work up   Patient Goals and CMS Choice            Expected Discharge Plan and Services     Post Acute Care Choice: Durable Medical Equipment Living arrangements for the past 2 months: Single Family Home                                      Prior Living Arrangements/Services Living arrangements for the past 2 months: Single Family Home Lives with:: Spouse Patient language and need for interpreter reviewed:: Yes Do you feel safe going back to the place where you live?: Yes      Need for Family Participation in Patient Care: Yes (Comment) Care giver support system in place?: Yes (comment)   Criminal Activity/Legal Involvement Pertinent to Current Situation/Hospitalization: No - Comment as needed  Activities of Daily Living   ADL Screening (condition at time of admission) Independently performs ADLs?: Yes (appropriate for  developmental age) Is the patient deaf or have difficulty hearing?: No Does the patient have difficulty seeing, even when wearing glasses/contacts?: No Does the patient have difficulty concentrating, remembering, or making decisions?: No  Permission Sought/Granted                  Emotional Assessment Appearance:: Appears stated age Attitude/Demeanor/Rapport: Engaged, Gracious Affect (typically observed): Accepting, Appropriate, Calm, Pleasant Orientation: : Oriented to Self, Oriented to Place, Oriented to  Time, Oriented to Situation Alcohol / Substance Use: Not Applicable Psych Involvement: No (comment)  Admission diagnosis:  Hypocalcemia [E83.51] Hypomagnesemia [E83.42] Ileus (HCC) [K56.7] Multiple myeloma, remission status unspecified (HCC) [C90.00] Patient Active Problem List   Diagnosis Date Noted   Ileus (HCC) 07/14/2023   Hypomagnesemia 07/14/2023   Hypocalcemia 07/14/2023   Atrial fibrillation (HCC) 06/03/2023   Thrombocytopenia (HCC) 06/03/2023   Bilateral lower extremity edema 06/03/2023   Constipation 05/06/2023   Hyperkalemia 04/22/2023   Encounter for antineoplastic chemotherapy 04/22/2023   Multiple myeloma (HCC) 04/14/2023   Goals of care, counseling/discussion 04/14/2023   CKD (chronic kidney disease) stage 3, GFR 30-59 ml/min (HCC) 04/14/2023   Symptomatic anemia 03/14/2023   Acute hypoxic respiratory failure (HCC) 02/11/2023   CAP (community acquired pneumonia) 02/11/2023   Type 2 diabetes mellitus (HCC) 02/11/2023   Essential hypertension 02/11/2023   Anemia 02/11/2023   Hypercholesteremia 02/11/2023   Elevated troponin 02/11/2023  AKI (acute kidney injury) (HCC) 02/11/2023   Weakness 02/11/2023   PCP:  Danella Penton, MD Pharmacy:   [facility] [address] (N), [place] - [address] [place] ([place]) [place] [address] Phone: [phone] Fax: [phone]  [facility], [place] - [id number] [place] [address] Phone: [phone] Fax: [phone]     Social Determinants of Health (SDOH) Social History: SDOH Screenings   Food Insecurity: No Food Insecurity (07/14/2023)  Recent Concern: Food Insecurity - Food Insecurity Present (07/05/2023)   Received from [facility]  Housing: Low Risk  (07/14/2023)  Transportation Needs: No Transportation Needs (07/14/2023)  Utilities: Not At Risk (07/14/2023)  Depression (PHQ2-9): Low Risk  (03/14/2023)  Financial Resource Strain: High Risk (07/05/2023)   Received from [facility]  Physical Activity: Sufficiently Active (09/24/2020)   Received from [facility], [facility]  Social Connections: Socially Integrated (09/24/2020)   Received from [facility], [facility]  Stress: No Stress Concern Present (09/24/2020)   Received from [facility], [facility]  Tobacco Use: High Risk (07/04/2023)   Received from [facility]   SDOH Interventions:     Readmission Risk Interventions     No data to display

## 2023-07-18 NOTE — Plan of Care (Signed)
  Problem: Education: Goal: Ability to describe self-care measures that may prevent or decrease complications (Diabetes Survival Skills Education) will improve Outcome: Progressing   Problem: Coping: Goal: Ability to adjust to condition or change in health will improve Outcome: Progressing   Problem: Fluid Volume: Goal: Ability to maintain a balanced intake and output will improve Outcome: Progressing   

## 2023-07-19 ENCOUNTER — Encounter: Payer: Self-pay | Admitting: Oncology

## 2023-07-19 DIAGNOSIS — K567 Ileus, unspecified: Secondary | ICD-10-CM | POA: Diagnosis not present

## 2023-07-19 LAB — CBC
HCT: 25.4 % — ABNORMAL LOW (ref 39.0–52.0)
Hemoglobin: 8.6 g/dL — ABNORMAL LOW (ref 13.0–17.0)
MCH: 33.3 pg (ref 26.0–34.0)
MCHC: 33.9 g/dL (ref 30.0–36.0)
MCV: 98.4 fL (ref 80.0–100.0)
Platelets: 66 10*3/uL — ABNORMAL LOW (ref 150–400)
RBC: 2.58 MIL/uL — ABNORMAL LOW (ref 4.22–5.81)
RDW: 14.7 % (ref 11.5–15.5)
WBC: 4.8 10*3/uL (ref 4.0–10.5)
nRBC: 0 % (ref 0.0–0.2)

## 2023-07-19 LAB — CULTURE, BLOOD (ROUTINE X 2)
Culture: NO GROWTH
Culture: NO GROWTH
Special Requests: ADEQUATE

## 2023-07-19 LAB — GLUCOSE, CAPILLARY
Glucose-Capillary: 126 mg/dL — ABNORMAL HIGH (ref 70–99)
Glucose-Capillary: 137 mg/dL — ABNORMAL HIGH (ref 70–99)

## 2023-07-19 MED ORDER — PANTOPRAZOLE SODIUM 40 MG PO TBEC: 40.0000 mg | DELAYED_RELEASE_TABLET | Freq: Every day | ORAL | 1 refills | Status: AC

## 2023-07-19 MED ORDER — POLYETHYLENE GLYCOL 3350 17 G PO PACK: 17.0000 g | PACK | Freq: Every day | ORAL | 0 refills | Status: DC

## 2023-07-19 MED ORDER — APIXABAN 2.5 MG PO TABS
2.5000 mg | ORAL_TABLET | Freq: Two times a day (BID) | ORAL | Status: DC
  Administered 2023-07-19: 2.5 mg via ORAL
  Filled 2023-07-19: qty 1

## 2023-07-19 NOTE — TOC Transition Note (Signed)
Transition of Care [facility]) - CM/SW Discharge Note   Patient Details  Name: Victor Phillips MRN: [id number] Date of Birth: 01/21/1956  Transition of Care [facility]) CM/SW Contact:  Margarito Liner, LCSW Phone Number: 07/19/2023, 9:24 AM   Clinical Narrative:   Patient has orders to discharge home today. No further concerns. CSW signing off.  Final next level of care: Home/Self Care Barriers to Discharge: Barriers Resolved   Patient Goals and CMS Choice      Discharge Placement                  Patient to be transferred to facility by: Wife or sister   Patient and family notified of of transfer: 07/19/23  Discharge Plan and Services Additional resources added to the After Visit Summary for       Post Acute Care Choice: Durable Medical Equipment          DME Arranged: Dan Humphreys rolling DME Agency: [employer] Date DME Agency Contacted: 07/18/23   Representative spoke with at DME Agency: Yvone Neu            Social Determinants of Health (SDOH) Interventions SDOH Screenings   Food Insecurity: No Food Insecurity (07/14/2023)  Recent Concern: Food Insecurity - Food Insecurity Present (07/05/2023)   [facility]  Housing: Low Risk  (07/14/2023)  Transportation Needs: No Transportation Needs (07/14/2023)  Utilities: Not At Risk (07/14/2023)  Depression (PHQ2-9): Low Risk  (03/14/2023)  Financial Resource Strain: High Risk (07/05/2023)   Received from [facility]  Physical Activity: Sufficiently Active (09/24/2020)   Received from [facility], [facility]  Social Connections: Socially Integrated (09/24/2020)   Received from [facility], [facility]  Stress: No Stress Concern Present (09/24/2020)   Received from [facility], [facility]  Tobacco Use: Low Risk  (07/18/2023)  Recent Concern: Tobacco Use - High Risk  (07/04/2023)   Received from [facility]     Readmission Risk Interventions     No data to display

## 2023-07-19 NOTE — Progress Notes (Signed)
[facility] SURGICAL PROGRESS NOTE (cpt [phone])  Hospital Day(s): 5.   Interval History: Patient seen and examined. Overnight, did require NGT placement. This has had 600 ccs out recorded. Patient reports he is doing well. No abdominal pain. He reports his abdomen is at baseline. No fever, chills, nausea, emesis. He has tolerated soft diet. Continues to have bowel function.   Review of Systems:  Constitutional: denies fever, chills  HEENT: denies cough or congestion  Respiratory: denies any shortness of breath  Cardiovascular: denies chest pain or palpitations  Gastrointestinal: denies abdominal pain, N/V Genitourinary: denies burning with urination or urinary frequency Musculoskeletal: denies pain, decreased motor or sensation  Vital signs in last 24 hours: [min-max] current  Temp:  [97.5 F (36.4 C)-98 F (36.7 C)] 98 F (36.7 C) (11/05 0348) Pulse Rate:  [90-101] 91 (11/05 0348) Resp:  [16-18] 16 (11/05 0348) BP: (102-137)/(69-89) 102/69 (11/05 0348) SpO2:  [98 %-100 %] 98 % (11/05 0727)     Height: 5\' 7"  (170.2 cm) Weight: 79.8 kg BMI (Calculated): 27.56   Intake/Output last 2 shifts:  No intake/output data recorded.   Physical Exam:  Constitutional: alert, cooperative and no distress  HENT: normocephalic without obvious abnormality; NGT in place; 70 cm at nare; bilious  Eyes: PERRL, EOM's grossly intact and symmetric  Respiratory: breathing non-labored at rest  Cardiovascular: regular rate and sinus rhythm  Gastrointestinal: soft, non-tender, non-distended, no rebound/guarding  Musculoskeletal: no edema or wounds, motor and sensation grossly intact, NT    Labs:     Latest Ref Rng & Units 07/19/2023    3:05 AM 07/18/2023    8:17 AM 07/17/2023    5:05 AM  CBC  WBC 4.0 - 10.5 K/uL 4.8  4.7  4.8   Hemoglobin 13.0 - 17.0 g/dL 8.6  9.0  9.1   Hematocrit 39.0 - 52.0 % 25.4  26.5  27.2   Platelets 150 - 400 K/uL 66  54  47       Latest Ref Rng & Units  07/18/2023    8:17 AM 07/17/2023    5:05 AM 07/16/2023    9:04 AM  CMP  Glucose 70 - 99 mg/dL 528  413  244   BUN 8 - 23 mg/dL 19  24  33   Creatinine 0.61 - 1.24 mg/dL 0.10  2.72  5.36   Sodium 135 - 145 mmol/L 134  138  136   Potassium 3.5 - 5.1 mmol/L 4.8  4.9  4.4   Chloride 98 - 111 mmol/L 104  108  104   CO2 22 - 32 mmol/L 22  23  22    Calcium 8.9 - 10.3 mg/dL 6.9  7.1  6.9   Total Protein 6.5 - 8.1 g/dL  6.0    Total Bilirubin 0.3 - 1.2 mg/dL  1.2    Alkaline Phos 38 - 126 U/L  55    AST 15 - 41 U/L  40    ALT 0 - 44 U/L  36      Imaging studies:  No new imaging studies    Assessment/Plan: (ICD-10's: K56.7) 67 y.o. male presenting with nausea/emesis found to have dilated loops of small bowel with gradual taper concerning for possible ileus vs SBO, NOW RESOLVED, complicated by hypocalcemia, Hx of multiple myeloma, PAF on Eliquis    - Okay to continue diet as tolerated   - No need for surgical intervention - Monitor abdominal examination; on-going bowel function  - Mobilize   -  Further management per primary service   - Discharge Planning: Okay for discharge from surgical perspective, does not need to follow up with [place]. Please call with questions/concerns.   All of the above findings and recommendations were discussed with the patient, and the medical team, and all of patient's questions were answered to his expressed satisfaction.  -- Lynden Oxford, PA-C [facility] 07/19/2023, 8:00 AM M-F: 7am - 4pm

## 2023-07-19 NOTE — Progress Notes (Signed)
IV's removed without complications. Discharge instructions reviewed, patient verbalized understanding. Patient is getting dressed and waiting for ride.  Cornell Barman Rohith Fauth

## 2023-07-19 NOTE — Discharge Summary (Signed)
Physician Discharge Summary   Patient: Victor Phillips MRN: [id number] DOB: December 30, 1955  Admit date:     07/14/2023  Discharge date: 07/19/23  Discharge Physician: Alba Cory   PCP: Danella Penton, MD   Recommendations at discharge:   Needs CBC to follow Platelet count.  Needs B-met to follow electrolytes.   Discharge Diagnoses: Principal Problem:   Ileus (HCC) Active Problems:   Type 2 diabetes mellitus (HCC)   Hypercholesteremia   Multiple myeloma (HCC)   Atrial fibrillation (HCC)   Thrombocytopenia (HCC)   Hypomagnesemia   Hypocalcemia  Resolved Problems:   * No resolved hospital problems. *  Hospital Course: 67 year old with past medical history significant for insulin-dependent diabetes, hypertension, hyperlipidemia, history of multiple myeloma, A-fib on Eliquis presents complaining of intractable nausea and vomiting.  He was found to have hyponatremia sodium 132, tachycardic heart rate 116, lactic acid 4.1 subsequently increased to 6.7.  He received IV fluids.  CT abdomen and pelvis showed disproportionately dilated multiple small bowel loops which gradually tapers in the right lower quadrant without discrete point of transition.  Findings favor adynamic ileus.  Consider continue imaging follow-up to rule out developing a small bowel obstruction.  No suspicious mass or inflammatory changes.   Patient admitted for SBO versus small bowel obstruction.  Surgery was consulted.  NG tube was placed.    Assessment and Plan: 1-Ileus  SBO ruled out.  -Appreciate surgery evaluation and recommendations -NG tube removed.  -Stop Fluids. as needed Phenergan -Needs to ambulate.  -K at 4.  Tolerating diet, having BM. Home today.   Hypocalcemia in the setting of hypoalbuminemia: Corrected calcium 8.1. Received 2 g of IV calcium gluconate. Started oral supplement.    Respiratory:  Congested. Chest x ray Mild vascular congestion without edema. Tiny bilateral pleural  effusions. Duo-neb Started Guaifenesin.  Stop IV fluids.  Improved.   Hypomagnesemia: Replaced   Thrombocytopenia: monitor. In setting of chemo for MM Platelet increasing 54 if remain stable will resume elquis tomorrow.    CKD stage II Previous cr range 1.1---1.6 Monitor renal function.    A-fib RVR  -Holding Eliquis in the setting of a small bowel obstruction versus ileus. -Follow surgery recommendation in regards anticoagulation -Would hold anticoagulation due to thrombocytopenia as well.  -Continue  Cardizem.  -resume Eliquis today, platelet count at 60   Multiple myeloma: -Continue follow-up with oncology outpatient   Hypercholesterolemia:  -Hold statins when able to tolerate oral   Diabetes type 2: -Hold oral hypoglycemic agents. -Sliding scale insulin as needed      HTN; Stop omelsartan hydrochlorothiazide to avoid dehydration, hypokalemia.        Consultants: Surgery  Procedures performed: None Disposition: Home Diet recommendation:  Discharge Diet Orders (From admission, onward)     Start     Ordered   07/19/23 0000  Diet - low sodium heart healthy        07/19/23 0852           Cardiac diet DISCHARGE MEDICATION: Allergies as of 07/19/2023   No Known Allergies      Medication List     STOP taking these medications    olmesartan-hydrochlorothiazide 20-12.5 MG tablet Commonly known as: BENICAR HCT       TAKE these medications    acetaminophen 325 MG tablet Commonly known as: TYLENOL Take 2 tablets (650 mg total) by mouth every 6 (six) hours as needed for mild pain (or Fever >/= 101).   acyclovir 400 MG tablet  Commonly known as: ZOVIRAX Take 1 tablet (400 mg total) by mouth 2 (two) times daily.   apixaban 2.5 MG Tabs tablet Commonly known as: Eliquis Take 1 tablet (2.5 mg total) by mouth 2 (two) times daily.   atorvastatin 10 MG tablet Commonly known as: LIPITOR Take 1 tablet by mouth daily.   calcium carbonate 600 MG Tabs  tablet Commonly known as: OS-CAL Take 2 tablets (1,200 mg total) by mouth daily.   cholecalciferol 25 MCG (1000 UNIT) tablet Commonly known as: VITAMIN D3 Take 1 tablet (1,000 Units total) by mouth daily.   diltiazem 120 MG 24 hr capsule Commonly known as: Cardizem CD Take 1 capsule (120 mg total) by mouth daily.   furosemide 20 MG tablet Commonly known as: LASIX Take 1 tablet (20 mg total) by mouth daily as needed for edema or fluid.   glipiZIDE 2.5 MG 24 hr tablet Commonly known as: GLUCOTROL XL Take by mouth.   lenalidomide 10 MG capsule Commonly known as: REVLIMID Take 1 capsule (10 mg total) by mouth daily. Take for 14 days, then hold for 7 days off. Repeat every 21 days.   multivitamin with minerals Tabs tablet Take 1 tablet by mouth daily.   ondansetron 8 MG tablet Commonly known as: Zofran Take 1 tablet (8 mg total) by mouth every 8 (eight) hours as needed for nausea or vomiting.   pantoprazole 40 MG tablet Commonly known as: Protonix Take 1 tablet (40 mg total) by mouth daily.   polyethylene glycol 17 g packet Commonly known as: MiraLax Take 17 g by mouth daily.   prochlorperazine 10 MG tablet Commonly known as: COMPAZINE Take 1 tablet (10 mg total) by mouth every 6 (six) hours as needed for nausea or vomiting.   senna 8.6 MG Tabs tablet Commonly known as: SENOKOT Take 2 tablets (17.2 mg total) by mouth daily.   temazepam 15 MG capsule Commonly known as: RESTORIL Take by mouth.               Durable Medical Equipment  (From admission, onward)           Start     Ordered   07/18/23 1134  For home use only DME Walker rolling  Once       Question Answer Comment  Walker: With 5 Inch Wheels   Patient needs a walker to treat with the following condition Balance disorder      07/18/23 1133            Follow-up Information     Danella Penton, MD. Go on 07/22/2023.   Specialty: Internal Medicine Why: Go at 10:30am. Contact  information: [address] [facility] : [facility] [address] [facility] [place] [facility] [place] [address] [phone]                Discharge Exam: [name] Weights   07/14/23 0739  Weight: 79.8 kg   General; NAD  Condition at discharge: stable  The results of significant diagnostics from this hospitalization (including imaging, microbiology, ancillary and laboratory) are listed below for reference.   Imaging Studies: DG Abd 1 View  Result Date: 07/17/2023 CLINICAL DATA:  Distension EXAM: ABDOMEN - 1 VIEW COMPARISON:  07/14/2023, CT 07/14/2023 FINDINGS: Removal of esophageal tube.persistent gaseous dilatation of right abdominal small bowel loops measuring up to 3.5 cm with mild diffuse air distension of the colon. No radiopaque calculi. IMPRESSION: Persistent gaseous dilatation of right abdominal small bowel loops with mild diffuse air distension of the colon. Findings could be  secondary to ileus or partial obstruction. Electronically Signed   By: Jasmine Pang M.D.   On: 07/17/2023 19:15   DG Chest 2 View  Result Date: 07/17/2023 CLINICAL DATA:  Shortness of breath. EXAM: CHEST - 2 VIEW COMPARISON:  07/14/2023 FINDINGS: Cardiopericardial silhouette is at upper limits of normal for size. Mild vascular congestion without edema. No focal consolidation or pneumothorax. Tiny bilateral pleural effusions evident. No acute bony abnormality. IMPRESSION: 1. Mild vascular congestion without edema. 2. Tiny bilateral pleural effusions. Electronically Signed   By: Kennith Center M.D.   On: 07/17/2023 11:30   DG Abd Portable 1V  Result Date: 07/15/2023 CLINICAL DATA:  NG tube placement EXAM: PORTABLE ABDOMEN - 1 VIEW COMPARISON:  CT 07/14/2023 FINDINGS: Esophageal tube tip in the distal stomach. Dilated bowel in the upper abdomen IMPRESSION: Esophageal tube tip in the distal stomach. Electronically Signed   By: Jasmine Pang M.D.   On: 07/15/2023 00:46   CT ABDOMEN PELVIS W CONTRAST  Result Date:  07/14/2023 CLINICAL DATA:  Bowel obstruction suspected. Nausea and vomiting. Right leg pain. EXAM: CT ABDOMEN AND PELVIS WITH CONTRAST TECHNIQUE: Multidetector CT imaging of the abdomen and pelvis was performed using the standard protocol following bolus administration of intravenous contrast. RADIATION DOSE REDUCTION: This exam was performed according to the departmental dose-optimization program which includes automated exposure control, adjustment of the mA and/or kV according to patient size and/or use of iterative reconstruction technique. CONTRAST:  OMNIPAQUE IOHEXOL 300 MG/ML  SOLN COMPARISON:  CT scan abdomen and pelvis from 02/11/2023. FINDINGS: Lower chest: The lung bases are clear. No pleural effusion. The heart is normal in size. No pericardial effusion. Hepatobiliary: The liver is normal in size. Non-cirrhotic configuration. No suspicious mass. These is mild diffuse hepatic steatosis. No intrahepatic or extrahepatic bile duct dilation. No calcified gallstones. Normal gallbladder wall thickness. No pericholecystic inflammatory changes. Pancreas: Unremarkable. No pancreatic ductal dilatation or surrounding inflammatory changes. Spleen: Within normal limits. No focal lesion. Adrenals/Urinary Tract: Adrenal glands are unremarkable. No suspicious renal mass. There are at least 2 subcentimeter sized simple cysts in the right kidney. No hydronephrosis. No renal or ureteric calculi. Unremarkable urinary bladder. Stomach/Bowel: There are multiple loops of small bowel which are dilated measuring up to 3.4 cm in diameter. The gradually taper in the right lower quadrant (series 6, image 33). No point of transition seen. Several distal ileal loops are nondilated. The colon contains moderate amount of stool burden without abnormal dilation. A normal appendix is not seen and may be surgically absent. No inflammatory changes in the right lower quadrant. No abnormal bowel wall thickening or surrounding fat  stranding. Vascular/Lymphatic: No ascites or pneumoperitoneum. No abdominal or pelvic lymphadenopathy, by size criteria. No aneurysmal dilation of the major abdominal arteries. There are moderate peripheral atherosclerotic vascular calcifications of the aorta and its major branches. Reproductive: Normal size prostate. Symmetric seminal vesicles. Other: There are bilateral small fat containing inguinal hernias. The soft tissues and abdominal wall are otherwise unremarkable. Musculoskeletal: No suspicious osseous lesions. There are mild multilevel degenerative changes in the visualized spine. IMPRESSION: *There are disproportionately dilated multiple small bowel loops which gradually tapers in the right lower quadrant without discrete point of transition. Findings favor adynamic ileus. Correlate clinically. Consider continued radiological follow-up to exclude developing small bowel obstruction. No suspicious mass or inflammatory changes noted. No pneumoperitoneum, ascites, pneumatosis or portal venous gas. *Multiple other nonacute observations, as described above. Electronically Signed   By: Jules Schick M.D.   On:  07/14/2023 13:38   CT Head Wo Contrast  Result Date: 07/14/2023 CLINICAL DATA:  Provided history: Transient ischemic attack (TIA). Nausea. Vomiting. EXAM: CT HEAD WITHOUT CONTRAST TECHNIQUE: Contiguous axial images were obtained from the base of the skull through the vertex without intravenous contrast. RADIATION DOSE REDUCTION: This exam was performed according to the departmental dose-optimization program which includes automated exposure control, adjustment of the mA and/or kV according to patient size and/or use of iterative reconstruction technique. COMPARISON:  Head CT 03/11/2023. FINDINGS: Brain: No age advanced or lobar predominant parenchymal atrophy. There is no acute intracranial hemorrhage. No demarcated cortical infarct. No extra-axial fluid collection. No evidence of an intracranial  mass. No midline shift. Vascular: No hyperdense vessel.  Atherosclerotic calcifications. Skull: No calvarial fracture or aggressive osseous lesion. Sinuses/Orbits: No mass or acute finding within the imaged orbits. Minimal mucosal thickening within the bilateral ethmoid sinuses. Minimal mucosal thickening, and tiny mucous retention cyst, within the left sphenoid sinus. IMPRESSION: 1. No evidence of an acute intracranial abnormality. 2. Minor paranasal sinus disease as described. Electronically Signed   By: Jackey Loge D.O.   On: 07/14/2023 12:35   DG Chest Port 1 View  Result Date: 07/14/2023 CLINICAL DATA:  Sepsis.  Nausea and vomiting.  Atrial fibrillation. EXAM: PORTABLE CHEST 1 VIEW COMPARISON:  05/28/2023 FINDINGS: Very low lung volumes limit evaluation. Both lungs are grossly clear. Heart size is within normal limits. Old left clavicle fracture deformity again noted. IMPRESSION: Very low lung volumes limit evaluation.  No acute findings. Electronically Signed   By: Danae Orleans M.D.   On: 07/14/2023 08:40    Microbiology: Results for orders placed or performed during the hospital encounter of 07/14/23  Blood Culture (routine x 2)     Status: None   Collection Time: 07/14/23  7:40 AM   Specimen: BLOOD  Result Value Ref Range Status   Specimen Description BLOOD LEFT FOREARM  Final   Special Requests   Final    BOTTLES DRAWN AEROBIC AND ANAEROBIC Blood Culture adequate volume   Culture   Final    NO GROWTH 5 DAYS Performed at [facility], [address] [place], [place] [address]    Report Status 07/19/2023 FINAL  Final  Blood Culture (routine x 2)     Status: None   Collection Time: 07/14/23  8:30 AM   Specimen: BLOOD  Result Value Ref Range Status   Specimen Description BLOOD BLOOD LEFT FOREARM  Final   Special Requests   Final    BOTTLES DRAWN AEROBIC AND ANAEROBIC Blood Culture results may not be optimal due to an excessive volume of blood received in culture bottles    Culture   Final    NO GROWTH 5 DAYS Performed at [facility], [address] [place], [place] [address]    Report Status 07/19/2023 FINAL  Final    Labs: CBC: Recent Labs  Lab 07/14/23 0740 07/15/23 0630 07/16/23 0904 07/17/23 0505 07/18/23 0817 07/19/23 0305  WBC 9.4 7.3 6.0 4.8 4.7 4.8  NEUTROABS 8.8*  --   --   --   --   --   HGB 11.3* 10.2* 9.4* 9.1* 9.0* 8.6*  HCT 32.7* 30.5* 27.5* 27.2* 26.5* 25.4*  MCV 95.1 99.7 96.2 98.6 97.4 98.4  PLT 78* 60* 48* 47* 54* 66*   Basic Metabolic Panel: Recent Labs  Lab 07/14/23 0956 07/15/23 0630 07/15/23 1836 07/16/23 0904 07/17/23 0505 07/18/23 0817  NA 132* 138 136 136 138 134*  K 3.6 4.3 4.4 4.4 4.9 4.8  CL 103 103 104 104 108 104  CO2 14* 23 25 22 23 22   GLUCOSE 138* 120* 83 123* 132* 153*  BUN 29* 44* 42* 33* 24* 19  CREATININE 0.89 1.43* 1.26* 1.00 0.96 1.01  CALCIUM 6.4* 7.2* 6.9* 6.9* 7.1* 6.9*  MG 1.3* 2.6* 3.1*  --   --   --    Liver Function Tests: Recent Labs  Lab 07/14/23 0956 07/15/23 0630 07/17/23 0505  AST 20 76* 40  ALT 17 39 36  ALKPHOS 41 60 55  BILITOT 0.6 1.0 1.2  PROT 3.7* 6.3* 6.0*  ALBUMIN 1.9* 3.2* 3.0*   CBG: Recent Labs  Lab 07/18/23 1613 07/18/23 1954 07/18/23 2336 07/19/23 0346 07/19/23 0824  GLUCAP 83 98 117* 126* 137*    Discharge time spent: greater than 30 minutes.  Signed: Alba Cory, MD [facility] 07/19/2023

## 2023-07-19 NOTE — Plan of Care (Signed)
  Problem: Education: Goal: Ability to describe self-care measures that may prevent or decrease complications (Diabetes Survival Skills Education) will improve Outcome: Progressing Goal: Individualized Educational Video(s) Outcome: Progressing   Problem: Coping: Goal: Ability to adjust to condition or change in health will improve Outcome: Progressing   Problem: Nutritional: Goal: Maintenance of adequate nutrition will improve Outcome: Progressing Goal: Progress toward achieving an optimal weight will improve Outcome: Progressing   Problem: Education: Goal: Knowledge of General Education information will improve Description: Including pain rating scale, medication(s)/side effects and non-pharmacologic comfort measures Outcome: Progressing   Problem: Activity: Goal: Risk for activity intolerance will decrease Outcome: Progressing   Problem: Nutrition: Goal: Adequate nutrition will be maintained Outcome: Progressing   Problem: Pain Management: Goal: General experience of comfort will improve Outcome: Progressing   Problem: Safety: Goal: Ability to remain free from injury will improve Outcome: Progressing

## 2023-07-19 NOTE — Plan of Care (Signed)
Adequate for discharge.

## 2023-07-19 NOTE — Care Management Important Message (Signed)
Important Message  Patient Details  Name: Victor Phillips MRN: [id number] Date of Birth: 22-May-1956   Important Message Given:  Yes - Medicare IM  Entered a second time as it did not display that I had already reviewed with the pt.    Olegario Messier A Antrone Walla 07/19/2023, 10:11 AM

## 2023-07-20 ENCOUNTER — Telehealth: Payer: Self-pay | Admitting: Cardiology

## 2023-07-20 NOTE — Telephone Encounter (Signed)
New Message:        Please have Dr Carl BestFonnie Birkenhead nurse to please call Annice Pih at [facility], concerning this patient.

## 2023-07-20 NOTE — Telephone Encounter (Signed)
Returned the call to [place] with [facility]. She stated the patient needs to have a cardiac mri prior to having his stem cell transplant. Dr. Cathie Hoops had reached out to Dr. Azucena Cecil previously to see if this could be done soon but the patient ended up getting hospitalized.  They have now reached out again to see if the patient can get the Cardiac MRI completed through Dr. Azucena Cecil by the end of next week.  If possible, she would like to have an answer by this Friday as the patient is coming in for a check up next Monday.

## 2023-07-22 ENCOUNTER — Ambulatory Visit: Payer: Medicare HMO

## 2023-07-22 ENCOUNTER — Inpatient Hospital Stay: Payer: Medicare HMO | Attending: Oncology | Admitting: Oncology

## 2023-07-22 ENCOUNTER — Inpatient Hospital Stay: Payer: Medicare HMO

## 2023-07-22 ENCOUNTER — Ambulatory Visit: Payer: Medicare HMO | Admitting: Oncology

## 2023-07-22 ENCOUNTER — Other Ambulatory Visit: Payer: Medicare HMO

## 2023-07-22 ENCOUNTER — Other Ambulatory Visit: Payer: Self-pay

## 2023-07-22 ENCOUNTER — Encounter: Payer: Self-pay | Admitting: Oncology

## 2023-07-22 VITALS — BP 129/73 | HR 100 | Temp 95.4°F | Resp 18 | Wt 179.2 lb

## 2023-07-22 DIAGNOSIS — Z79899 Other long term (current) drug therapy: Secondary | ICD-10-CM | POA: Insufficient documentation

## 2023-07-22 DIAGNOSIS — Z5111 Encounter for antineoplastic chemotherapy: Secondary | ICD-10-CM

## 2023-07-22 DIAGNOSIS — Z7901 Long term (current) use of anticoagulants: Secondary | ICD-10-CM | POA: Diagnosis not present

## 2023-07-22 DIAGNOSIS — T451X5A Adverse effect of antineoplastic and immunosuppressive drugs, initial encounter: Secondary | ICD-10-CM | POA: Diagnosis not present

## 2023-07-22 DIAGNOSIS — D6959 Other secondary thrombocytopenia: Secondary | ICD-10-CM | POA: Insufficient documentation

## 2023-07-22 DIAGNOSIS — Z5112 Encounter for antineoplastic immunotherapy: Secondary | ICD-10-CM | POA: Diagnosis present

## 2023-07-22 DIAGNOSIS — C9000 Multiple myeloma not having achieved remission: Secondary | ICD-10-CM

## 2023-07-22 DIAGNOSIS — I4891 Unspecified atrial fibrillation: Secondary | ICD-10-CM | POA: Diagnosis not present

## 2023-07-22 DIAGNOSIS — N183 Chronic kidney disease, stage 3 unspecified: Secondary | ICD-10-CM | POA: Diagnosis not present

## 2023-07-22 DIAGNOSIS — N1832 Chronic kidney disease, stage 3b: Secondary | ICD-10-CM | POA: Diagnosis not present

## 2023-07-22 DIAGNOSIS — R600 Localized edema: Secondary | ICD-10-CM

## 2023-07-22 DIAGNOSIS — E1122 Type 2 diabetes mellitus with diabetic chronic kidney disease: Secondary | ICD-10-CM | POA: Insufficient documentation

## 2023-07-22 DIAGNOSIS — D696 Thrombocytopenia, unspecified: Secondary | ICD-10-CM

## 2023-07-22 DIAGNOSIS — I129 Hypertensive chronic kidney disease with stage 1 through stage 4 chronic kidney disease, or unspecified chronic kidney disease: Secondary | ICD-10-CM | POA: Insufficient documentation

## 2023-07-22 LAB — CMP (CANCER CENTER ONLY)
ALT: 32 U/L (ref 0–44)
AST: 16 U/L (ref 15–41)
Albumin: 3.5 g/dL (ref 3.5–5.0)
Alkaline Phosphatase: 107 U/L (ref 38–126)
Anion gap: 6 (ref 5–15)
BUN: 20 mg/dL (ref 8–23)
CO2: 24 mmol/L (ref 22–32)
Calcium: 7.8 mg/dL — ABNORMAL LOW (ref 8.9–10.3)
Chloride: 104 mmol/L (ref 98–111)
Creatinine: 1.14 mg/dL (ref 0.61–1.24)
GFR, Estimated: >60 mL/min (ref >60)
Glucose, Bld: 187 mg/dL — ABNORMAL HIGH (ref 70–99)
Potassium: 4.8 mmol/L (ref 3.5–5.1)
Sodium: 134 mmol/L — ABNORMAL LOW (ref 135–145)
Total Bilirubin: 1 mg/dL (ref <1.2)
Total Protein: 6.9 g/dL (ref 6.5–8.1)

## 2023-07-22 LAB — CBC WITH DIFFERENTIAL (CANCER CENTER ONLY)
Abs Immature Granulocytes: 0.04 10*3/uL (ref 0.00–0.07)
Basophils Absolute: 0 10*3/uL (ref 0.0–0.1)
Basophils Relative: 0 %
Eosinophils Absolute: 0.1 10*3/uL (ref 0.0–0.5)
Eosinophils Relative: 2 %
HCT: 29 % — ABNORMAL LOW (ref 39.0–52.0)
Hemoglobin: 9.6 g/dL — ABNORMAL LOW (ref 13.0–17.0)
Immature Granulocytes: 1 %
Lymphocytes Relative: 13 %
Lymphs Abs: 0.4 10*3/uL — ABNORMAL LOW (ref 0.7–4.0)
MCH: 32.8 pg (ref 26.0–34.0)
MCHC: 33.1 g/dL (ref 30.0–36.0)
MCV: 99 fL (ref 80.0–100.0)
Monocytes Absolute: 0.4 10*3/uL (ref 0.1–1.0)
Monocytes Relative: 14 %
Neutro Abs: 2 10*3/uL (ref 1.7–7.7)
Neutrophils Relative %: 70 %
Platelet Count: 160 10*3/uL (ref 150–400)
RBC: 2.93 MIL/uL — ABNORMAL LOW (ref 4.22–5.81)
RDW: 14.9 % (ref 11.5–15.5)
WBC Count: 2.9 10*3/uL — ABNORMAL LOW (ref 4.0–10.5)
nRBC: 0 % (ref 0.0–0.2)

## 2023-07-22 NOTE — Progress Notes (Signed)
Hematology/Oncology Progress note [phone] [phone]         Patient Care Team: Danella Penton, MD as PCP - General (Internal Medicine) [name], MD as Consulting Physician (Oncology)  CHIEF COMPLAINTS/REASON FOR VISIT:  Multiple myeloma   ASSESSMENT & PLAN:   Cancer Staging  Multiple myeloma [facility]) Staging form: Plasma Cell Myeloma and Plasma Cell Disorders, AJCC 8th Edition - Clinical stage from 04/14/2023: RISS Stage II (Beta-2-microglobulin (mg/L): 8.4, Albumin (g/dL): 2.6, ISS: Stage III, High-risk cytogenetics: Absent, LDH: Normal) - Signed by [name], MD on 04/22/2023   Multiple myeloma (HCC) Bone marrow biopsy was reviewed. 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 Cycle 3 D15 Daratumumab was held due to cytopenia and being on anticoagulation.  S/p 4 cycles  Daratumumab and Velcade with revlimid 10mg  2 weeks on 1 week off  Cycle 4 D15 missed due to hospitalization due to ileus.  Pending [facility] decision of bone marrow transplant. I expect he will repeat a bone marrow biopsy at [facility].  If he needs additional treatments bridging to transplant, I will arrange.   Continue Acyclovir, At high risk of thrombosis, recommend  Eliquis 2.5mg  BID. Rx sent He got 1 month free trial, pending additional approval for drug assistance.  Xegeva monthly.   Recommend calcium and vitamin D supplementation.   [facility] evaluation for BM transplant -pending cardiac MRI  Atrial fibrillation (HCC) Echo was done on 06/14/23 LVEF 50% Follow up with cardiology for evaluation.    CKD (chronic kidney disease) stage 3, GFR 30-59 ml/min (HCC) Encourage oral hydration and avoid nephrotoxins.    Encounter for antineoplastic chemotherapy Chemotherapy plan as listed above. - hold treatment for now due to recent Ileus.  pending decision of BM transplant    Thrombocytopenia (HCC) Chemotherapy induced. Close monitor  Bilateral lower extremity edema No DVT on US Echo LVEF 50% -55%. He has cardiology appt  Recommend Lasix 20mg  daily PRN for edema    Orders Placed This Encounter  Procedures   Multiple Myeloma Panel (SPEP&IFE w/QIG)    Standing Status:   Future    Standing Expiration Date:   08/04/2024   Kappa/lambda light chains    Standing Status:   Future    Standing Expiration Date:   08/04/2024   CBC with Differential (Cancer Center Only)    Standing Status:   Future    Standing Expiration Date:   08/04/2024   CMP (Cancer Center only)    Standing Status:   Future    Standing Expiration Date:   08/04/2024   Follow-up 2 weeks   We spent sufficient time to discuss many aspect of care, questions were answered to patient's satisfaction.    [name], MD, PhD [facility] Hematology Oncology 07/22/2023     HISTORY OF PRESENTING ILLNESS:  Victor Phillips is a  67 y.o.  male with PMH listed below who was referred to me for anemia  02/11/2023 - 02/14/2023 recent hospitalization due to pneumonia, respiratory failure.  He was found to have a hemoglobin decreased at 6.8, status post PRBC transfusion during admission.  EGD showed gastritis.  Colonoscopy was not remarkable. 02/11/2023 TIBC 221 ferritin 107, iron saturation 16. Patient is currently taking fusion plus Vitamin B12 level in the 300s. His echo showed grade 2 diastolic CHF.  He denies recent chest pain on  exertion, shortness of breath on minimal exertion, pre-syncopal episodes, or palpitations He had not noticed any recent bleeding such as epistaxis, hematuria or hematochezia.  He denies over the counter NSAID ingestion.  Oncology History  Multiple myeloma (HCC)  04/14/2023 Initial Diagnosis   Multiple myeloma   03/13/2020 multiple myeloma panel showed M protein 4.3, IgA lambda Free lamda Level 142,-light chain ratio 0.07 04/05/2023 bone  marrow biopsy showed hypercellular bone marrow with plasma cell neoplasm.The bone marrow is hypercellular for age with prominent increase in plasma cells representing 65% of all cells in the aspirate associated with interstitial infiltrates and numerous variably sized aggregates in  the clot and biopsy sections.  The plasma cells display lambda light chain restriction consistent with plasma cell neoplasm.  Normal cytogenetics.  FISH studies pending.    04/14/2023 Cancer Staging   Staging form: Plasma Cell Myeloma and Plasma Cell Disorders, AJCC 8th Edition - Clinical stage from 04/14/2023: RISS Stage II (Beta-2-microglobulin (mg/L): 8.4, Albumin (g/dL): 2.6, ISS: Stage III, High-risk cytogenetics: Absent, LDH: Normal) - Signed by [name], MD on 04/22/2023 Stage prefix: Initial diagnosis Beta 2 microglobulin range (mg/L): Greater than or equal to 5.5 Albumin range (g/dL): Less than 3.5 Cytogenetics: 1q addition, Other mutation   04/14/2023 Imaging   Skeletal survey 1. No lytic lesions or other intrinsic bony abnormality. 2. Borderline cardiomegaly with an interval decrease in size. 3. Diffuse atheromatous arterial calcifications including bilateral carotid artery calcifications, right greater than left   04/22/2023 -  Chemotherapy   Patient is on Treatment Plan : MYELOMA NEWLY DIAGNOSED TRANSPLANT CANDIDATE DaraVRd (Daratumumab SQ) q21d x 6 Cycles (Induction/Consolidation)      He takes Acyclovir  A Fib. Lowe extremity edema bilaterally, and SOB with exertion.  Now on Eliquis 2.5mg  BID.  Recent admission for Ileous and thrombocytopenia. Symptoms have improved.  Denies chest pain nausea, vomiting, diarrhea.  He took lasix daily PRN, leg swelling has improved.    MEDICAL HISTORY:  Past Medical History:  Diagnosis Date   Diabetes mellitus without complication (HCC)    Hypertension     SURGICAL HISTORY: Past Surgical History:  Procedure Laterality Date   COLONOSCOPY N/A 02/13/2023    Procedure: COLONOSCOPY;  Surgeon: Toledo, Boykin Nearing, MD;  Location: [facility] ENDOSCOPY;  Service: Gastroenterology;  Laterality: N/A;   COLONOSCOPY N/A 02/14/2023   Procedure: COLONOSCOPY;  Surgeon: Toledo, Boykin Nearing, MD;  Location: [facility] ENDOSCOPY;  Service: Gastroenterology;  Laterality: N/A;   ESOPHAGOGASTRODUODENOSCOPY N/A 02/13/2023   Procedure: ESOPHAGOGASTRODUODENOSCOPY (EGD);  Surgeon: Toledo, Boykin Nearing, MD;  Location: [facility] ENDOSCOPY;  Service: Gastroenterology;  Laterality: N/A;    SOCIAL HISTORY: Social History   Socioeconomic History   Marital status: Married    Spouse name: Not on file   Number of children: Not on file   Years of education: Not on file   Highest education level: Not on file  Occupational History   Not on file  Tobacco Use   Smoking status: Never   Smokeless tobacco: Never  Vaping Use   Vaping status: Never Used  Substance and Sexual Activity   Alcohol use: Not Currently    Comment: quit approx 2001   Drug use: No   Sexual activity: Not on file  Other Topics Concern   Not on file  Social History Narrative   Not on file   Social Determinants of Health   Financial Resource Strain: High Risk (07/05/2023)   Received from [facility]   Overall Financial Resource Strain (CARDIA)  Difficulty of Paying Living Expenses: Hard  Food Insecurity: No Food Insecurity (07/14/2023)   Hunger Vital Sign    Worried About Running Out of Food in the Last Year: Never true    Ran Out of Food in the Last Year: Never true  Recent Concern: Food Insecurity - Food Insecurity Present (07/05/2023)   Received from [facility]   Hunger Vital Sign    Worried About Running Out of Food in the Last Year: Sometimes true    Ran Out of Food in the Last Year: Sometimes true  Transportation Needs: No Transportation Needs (07/14/2023)   PRAPARE - Administrator, Civil Service (Medical): No    Lack of Transportation (Non-Medical): No   Physical Activity: Sufficiently Active (09/24/2020)   Received from [facility], [facility]   Exercise Vital Sign    Days of Exercise per Week: 6 days    Minutes of Exercise per Session: 30 min  Stress: No Stress Concern Present (09/24/2020)   Received from [facility], [facility]   [employer] of Occupational Health - Occupational Stress Questionnaire    Feeling of Stress : Not at all  Social Connections: Socially Integrated (09/24/2020)   Received from [facility], [facility]   Social Connection and Isolation Panel [NHANES]    Frequency of Communication with Friends and Family: More than three times a week    Frequency of Social Gatherings with Friends and Family: Once a week    Attends Religious Services: More than 4 times per year    Active Member of Clubs or Organizations: Yes    Attends Engineer, structural: More than 4 times per year    Marital Status: Married  Catering manager Violence: Not At Risk (07/14/2023)   Humiliation, Afraid, Rape, and Kick questionnaire    Fear of Current or Ex-Partner: No    Emotionally Abused: No    Physically Abused: No    Sexually Abused: No    FAMILY HISTORY: Family History  Problem Relation Age of Onset   Diabetes Mother    Heart attack Father     ALLERGIES:  has No Known Allergies.  MEDICATIONS:  Current Outpatient Medications  Medication Sig Dispense Refill   acetaminophen (TYLENOL) 325 MG tablet Take 2 tablets (650 mg total) by mouth every 6 (six) hours as needed for mild pain (or Fever >/= 101). 90 tablet 1   acyclovir (ZOVIRAX) 400 MG tablet Take 1 tablet (400 mg total) by mouth 2 (two) times daily. 60 tablet 5   apixaban (ELIQUIS) 2.5 MG TABS tablet Take 1 tablet (2.5 mg total) by mouth 2 (two) times daily. 60 tablet 1   atorvastatin (LIPITOR) 10 MG tablet Take 1 tablet by mouth daily.     calcium carbonate  (OS-CAL) 600 MG TABS tablet Take 2 tablets (1,200 mg total) by mouth daily.     cholecalciferol (VITAMIN D3) 25 MCG (1000 UNIT) tablet Take 1 tablet (1,000 Units total) by mouth daily.     diltiazem (CARDIZEM CD) 120 MG 24 hr capsule Take 1 capsule (120 mg total) by mouth daily. 30 capsule 0   furosemide (LASIX) 20 MG tablet Take 1 tablet (20 mg total) by mouth daily as needed for edema or fluid. 30 tablet 0   glipiZIDE (GLUCOTROL XL) 2.5 MG 24 hr tablet Take by mouth.     lenalidomide (REVLIMID) 10 MG capsule Take 1 capsule (10  mg total) by mouth daily. Take for 14 days, then hold for 7 days off. Repeat every 21 days. 14 capsule 0   Multiple Vitamin (MULTIVITAMIN WITH MINERALS) TABS tablet Take 1 tablet by mouth daily. 90 tablet 1   ondansetron (ZOFRAN) 8 MG tablet Take 1 tablet (8 mg total) by mouth every 8 (eight) hours as needed for nausea or vomiting. 30 tablet 1   pantoprazole (PROTONIX) 40 MG tablet Take 1 tablet (40 mg total) by mouth daily. 30 tablet 1   polyethylene glycol (MIRALAX) 17 g packet Take 17 g by mouth daily. 14 each 0   prochlorperazine (COMPAZINE) 10 MG tablet Take 1 tablet (10 mg total) by mouth every 6 (six) hours as needed for nausea or vomiting. 30 tablet 1   senna (SENOKOT) 8.6 MG TABS tablet Take 2 tablets (17.2 mg total) by mouth daily. 120 tablet 0   temazepam (RESTORIL) 15 MG capsule Take by mouth.     No current facility-administered medications for this visit.    Review of Systems  Constitutional:  Positive for fatigue. Negative for appetite change, chills, fever and unexpected weight change.  HENT:   Negative for hearing loss and voice change.   Eyes:  Negative for eye problems and icterus.  Respiratory:  Negative for chest tightness, cough and shortness of breath.   Cardiovascular:  Positive for leg swelling. Negative for chest pain.  Gastrointestinal:  Negative for abdominal distention and abdominal pain.  Endocrine: Negative for hot flashes.   Genitourinary:  Negative for difficulty urinating, dysuria and frequency.   Musculoskeletal:  Negative for arthralgias.  Skin:  Negative for itching and rash.  Neurological:  Negative for light-headedness and numbness.  Hematological:  Negative for adenopathy. Does not bruise/bleed easily.  Psychiatric/Behavioral:  Negative for confusion.     PHYSICAL EXAMINATION: Vitals:   07/22/23 0845  BP: 129/73  Pulse: 100  Resp: 18  Temp: (!) 95.4 F (35.2 C)  SpO2: 99%   Filed Weights   07/22/23 0845  Weight: 179 lb 3.2 oz (81.3 kg)    Physical Exam Constitutional:      General: He is not in acute distress. HENT:     Head: Normocephalic and atraumatic.  Eyes:     General: No scleral icterus. Cardiovascular:     Rate and Rhythm: Normal rate and regular rhythm.     Heart sounds: Normal heart sounds.  Pulmonary:     Effort: Pulmonary effort is normal. No respiratory distress.     Comments: Crackles at the base of lung  Abdominal:     General: Bowel sounds are normal. There is no distension.     Palpations: Abdomen is soft.  Musculoskeletal:        General: No deformity. Normal range of motion.     Cervical back: Normal range of motion and neck supple.     Right lower leg: Edema present.     Left lower leg: Edema present.  Skin:    General: Skin is warm and dry.     Findings: No erythema or rash.  Neurological:     Mental Status: He is alert and oriented to person, place, and time. Mental status is at baseline.     Cranial Nerves: No cranial nerve deficit.     Coordination: Coordination normal.  Psychiatric:        Mood and Affect: Mood normal.      LABORATORY DATA:  I have reviewed the data as listed    Latest Ref  Rng & Units 07/22/2023    7:53 AM 07/19/2023    3:05 AM 07/18/2023    8:17 AM  CBC  WBC 4.0 - 10.5 K/uL 2.9  4.8  4.7   Hemoglobin 13.0 - 17.0 g/dL 9.6  8.6  9.0   Hematocrit 39.0 - 52.0 % 29.0  25.4  26.5   Platelets 150 - 400 K/uL 160  66  54        Latest Ref Rng & Units 07/22/2023    7:53 AM 07/18/2023    8:17 AM 07/17/2023    5:05 AM  CMP  Glucose 70 - 99 mg/dL 161  096  045   BUN 8 - 23 mg/dL 20  19  24    Creatinine 0.61 - 1.24 mg/dL 4.09  8.11  9.14   Sodium 135 - 145 mmol/L 134  134  138   Potassium 3.5 - 5.1 mmol/L 4.8  4.8  4.9   Chloride 98 - 111 mmol/L 104  104  108   CO2 22 - 32 mmol/L 24  22  23    Calcium 8.9 - 10.3 mg/dL 7.8  6.9  7.1   Total Protein 6.5 - 8.1 g/dL 6.9   6.0   Total Bilirubin <1.2 mg/dL 1.0   1.2   Alkaline Phos 38 - 126 U/L 107   55   AST 15 - 41 U/L 16   40   ALT 0 - 44 U/L 32   36    Lab Results  Component Value Date   IRON 100 03/14/2023   TIBC 272 03/14/2023   IRONPCTSAT 37 03/14/2023   FERRITIN 249 03/14/2023     RADIOGRAPHIC STUDIES: I have personally reviewed the radiological images as listed and agreed with the findings in the report. DG Abd 1 View  Result Date: 07/17/2023 CLINICAL DATA:  Distension EXAM: ABDOMEN - 1 VIEW COMPARISON:  07/14/2023, CT 07/14/2023 FINDINGS: Removal of esophageal tube.persistent gaseous dilatation of right abdominal small bowel loops measuring up to 3.5 cm with mild diffuse air distension of the colon. No radiopaque calculi. IMPRESSION: Persistent gaseous dilatation of right abdominal small bowel loops with mild diffuse air distension of the colon. Findings could be secondary to ileus or partial obstruction. Electronically Signed   By: Jasmine Pang M.D.   On: 07/17/2023 19:15   DG Chest 2 View  Result Date: 07/17/2023 CLINICAL DATA:  Shortness of breath. EXAM: CHEST - 2 VIEW COMPARISON:  07/14/2023 FINDINGS: Cardiopericardial silhouette is at upper limits of normal for size. Mild vascular congestion without edema. No focal consolidation or pneumothorax. Tiny bilateral pleural effusions evident. No acute bony abnormality. IMPRESSION: 1. Mild vascular congestion without edema. 2. Tiny bilateral pleural effusions. Electronically Signed   By: Kennith Center M.D.    On: 07/17/2023 11:30   DG Abd Portable 1V  Result Date: 07/15/2023 CLINICAL DATA:  NG tube placement EXAM: PORTABLE ABDOMEN - 1 VIEW COMPARISON:  CT 07/14/2023 FINDINGS: Esophageal tube tip in the distal stomach. Dilated bowel in the upper abdomen IMPRESSION: Esophageal tube tip in the distal stomach. Electronically Signed   By: Jasmine Pang M.D.   On: 07/15/2023 00:46   CT ABDOMEN PELVIS W CONTRAST  Result Date: 07/14/2023 CLINICAL DATA:  Bowel obstruction suspected. Nausea and vomiting. Right leg pain. EXAM: CT ABDOMEN AND PELVIS WITH CONTRAST TECHNIQUE: Multidetector CT imaging of the abdomen and pelvis was performed using the standard protocol following bolus administration of intravenous contrast. RADIATION DOSE REDUCTION: This exam was performed according to  the departmental dose-optimization program which includes automated exposure control, adjustment of the mA and/or kV according to patient size and/or use of iterative reconstruction technique. CONTRAST:  OMNIPAQUE IOHEXOL 300 MG/ML  SOLN COMPARISON:  CT scan abdomen and pelvis from 02/11/2023. FINDINGS: Lower chest: The lung bases are clear. No pleural effusion. The heart is normal in size. No pericardial effusion. Hepatobiliary: The liver is normal in size. Non-cirrhotic configuration. No suspicious mass. These is mild diffuse hepatic steatosis. No intrahepatic or extrahepatic bile duct dilation. No calcified gallstones. Normal gallbladder wall thickness. No pericholecystic inflammatory changes. Pancreas: Unremarkable. No pancreatic ductal dilatation or surrounding inflammatory changes. Spleen: Within normal limits. No focal lesion. Adrenals/Urinary Tract: Adrenal glands are unremarkable. No suspicious renal mass. There are at least 2 subcentimeter sized simple cysts in the right kidney. No hydronephrosis. No renal or ureteric calculi. Unremarkable urinary bladder. Stomach/Bowel: There are multiple loops of small bowel which are dilated  measuring up to 3.4 cm in diameter. The gradually taper in the right lower quadrant (series 6, image 33). No point of transition seen. Several distal ileal loops are nondilated. The colon contains moderate amount of stool burden without abnormal dilation. A normal appendix is not seen and may be surgically absent. No inflammatory changes in the right lower quadrant. No abnormal bowel wall thickening or surrounding fat stranding. Vascular/Lymphatic: No ascites or pneumoperitoneum. No abdominal or pelvic lymphadenopathy, by size criteria. No aneurysmal dilation of the major abdominal arteries. There are moderate peripheral atherosclerotic vascular calcifications of the aorta and its major branches. Reproductive: Normal size prostate. Symmetric seminal vesicles. Other: There are bilateral small fat containing inguinal hernias. The soft tissues and abdominal wall are otherwise unremarkable. Musculoskeletal: No suspicious osseous lesions. There are mild multilevel degenerative changes in the visualized spine. IMPRESSION: *There are disproportionately dilated multiple small bowel loops which gradually tapers in the right lower quadrant without discrete point of transition. Findings favor adynamic ileus. Correlate clinically. Consider continued radiological follow-up to exclude developing small bowel obstruction. No suspicious mass or inflammatory changes noted. No pneumoperitoneum, ascites, pneumatosis or portal venous gas. *Multiple other nonacute observations, as described above. Electronically Signed   By: Jules Schick M.D.   On: 07/14/2023 13:38   CT Head Wo Contrast  Result Date: 07/14/2023 CLINICAL DATA:  Provided history: Transient ischemic attack (TIA). Nausea. Vomiting. EXAM: CT HEAD WITHOUT CONTRAST TECHNIQUE: Contiguous axial images were obtained from the base of the skull through the vertex without intravenous contrast. RADIATION DOSE REDUCTION: This exam was performed according to the departmental  dose-optimization program which includes automated exposure control, adjustment of the mA and/or kV according to patient size and/or use of iterative reconstruction technique. COMPARISON:  Head CT 03/11/2023. FINDINGS: Brain: No age advanced or lobar predominant parenchymal atrophy. There is no acute intracranial hemorrhage. No demarcated cortical infarct. No extra-axial fluid collection. No evidence of an intracranial mass. No midline shift. Vascular: No hyperdense vessel.  Atherosclerotic calcifications. Skull: No calvarial fracture or aggressive osseous lesion. Sinuses/Orbits: No mass or acute finding within the imaged orbits. Minimal mucosal thickening within the bilateral ethmoid sinuses. Minimal mucosal thickening, and tiny mucous retention cyst, within the left sphenoid sinus. IMPRESSION: 1. No evidence of an acute intracranial abnormality. 2. Minor paranasal sinus disease as described. Electronically Signed   By: Jackey Loge D.O.   On: 07/14/2023 12:35   DG Chest Port 1 View  Result Date: 07/14/2023 CLINICAL DATA:  Sepsis.  Nausea and vomiting.  Atrial fibrillation. EXAM: PORTABLE CHEST 1 VIEW COMPARISON:  05/28/2023 FINDINGS: Very low lung volumes limit evaluation. Both lungs are grossly clear. Heart size is within normal limits. Old left clavicle fracture deformity again noted. IMPRESSION: Very low lung volumes limit evaluation.  No acute findings. Electronically Signed   By: Danae Orleans M.D.   On: 07/14/2023 08:40

## 2023-07-22 NOTE — Telephone Encounter (Signed)
Orders placed.

## 2023-07-22 NOTE — Assessment & Plan Note (Addendum)
Chemotherapy plan as listed above. - hold treatment for now due to recent Ileus. pending decision of BM transplant

## 2023-07-22 NOTE — Assessment & Plan Note (Signed)
Encourage oral hydration and avoid nephrotoxins.   

## 2023-07-22 NOTE — Assessment & Plan Note (Signed)
No DVT on US Echo LVEF 50% -55%. He has cardiology appt  Recommend Lasix 20mg  daily PRN for edema

## 2023-07-22 NOTE — Assessment & Plan Note (Signed)
Chemotherapy induced. Close monitor 

## 2023-07-22 NOTE — Assessment & Plan Note (Addendum)
Bone marrow biopsy was reviewed. 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 Cycle 3 D15 Daratumumab was held due to cytopenia and being on anticoagulation.  S/p 4 cycles  Daratumumab and Velcade with revlimid 10mg  2 weeks on 1 week off  Cycle 4 D15 missed due to hospitalization due to ileus.  Pending [facility] decision of bone marrow transplant. I expect he will repeat a bone marrow biopsy at [facility].  If he needs additional treatments bridging to transplant, I will arrange.   Continue Acyclovir, At high risk of thrombosis, recommend  Eliquis 2.5mg  BID. Rx sent He got 1 month free trial, pending additional approval for drug assistance.  Xegeva monthly.   Recommend calcium and vitamin D supplementation.   [facility] evaluation for BM transplant -pending cardiac MRI

## 2023-07-22 NOTE — Telephone Encounter (Signed)
I sent it to precert as a high priority just incase.

## 2023-07-22 NOTE — Assessment & Plan Note (Addendum)
Echo was done on 06/14/23 LVEF 50% Follow up with cardiology for evaluation.

## 2023-07-22 NOTE — Telephone Encounter (Signed)
Can you please help with this? If there is anything I need to do, please let me know!    Thanks,  [place], [place]

## 2023-07-24 ENCOUNTER — Other Ambulatory Visit: Payer: Self-pay

## 2023-07-25 ENCOUNTER — Ambulatory Visit: Payer: Medicare HMO

## 2023-07-25 LAB — KAPPA/LAMBDA LIGHT CHAINS
Kappa free light chain: 8.7 mg/L (ref 3.3–19.4)
Kappa, lambda light chain ratio: 0.11 — ABNORMAL LOW (ref 0.26–1.65)
Lambda free light chains: 77.4 mg/L — ABNORMAL HIGH (ref 5.7–26.3)

## 2023-07-26 ENCOUNTER — Telehealth (HOSPITAL_COMMUNITY): Payer: Self-pay | Admitting: Emergency Medicine

## 2023-07-26 NOTE — Telephone Encounter (Signed)
Reaching out to patient to offer assistance regarding upcoming cardiac imaging study; pt verbalizes understanding of appt date/time, parking situation and where to check in, pre-test NPO status and medications ordered, and verified current allergies; name and call back number provided for further questions should they arise Rockwell Alexandria RN Navigator Cardiac Imaging [facility] [phone] office [phone] cell  Some claustro- advised to take benadryl with designated driver

## 2023-07-27 ENCOUNTER — Other Ambulatory Visit: Payer: Self-pay | Admitting: Cardiology

## 2023-07-27 ENCOUNTER — Ambulatory Visit
Admission: RE | Admit: 2023-07-27 | Discharge: 2023-07-27 | Disposition: A | Discharge status: Home / Self Care | Payer: Medicare HMO | Source: Ambulatory Visit | Attending: Cardiology | Admitting: Cardiology

## 2023-07-27 DIAGNOSIS — C9000 Multiple myeloma not having achieved remission: Secondary | ICD-10-CM | POA: Diagnosis not present

## 2023-07-27 LAB — MULTIPLE MYELOMA PANEL, SERUM
Albumin SerPl Elph-Mcnc: 3.4 g/dL (ref 2.9–4.4)
Albumin/Glob SerPl: 1.2 (ref 0.7–1.7)
Alpha 1: 0.3 g/dL (ref 0.0–0.4)
Alpha2 Glob SerPl Elph-Mcnc: 0.8 g/dL (ref 0.4–1.0)
B-Globulin SerPl Elph-Mcnc: 0.7 g/dL (ref 0.7–1.3)
Gamma Glob SerPl Elph-Mcnc: 1.1 g/dL (ref 0.4–1.8)
Globulin, Total: 2.9 g/dL (ref 2.2–3.9)
IgA: 1606 mg/dL — ABNORMAL HIGH (ref 61–437)
IgG (Immunoglobin G), Serum: 281 mg/dL — ABNORMAL LOW (ref 603–1613)
IgM (Immunoglobulin M), Srm: 9 mg/dL — ABNORMAL LOW (ref 20–172)
M Protein SerPl Elph-Mcnc: 0.8 g/dL — ABNORMAL HIGH
Total Protein ELP: 6.3 g/dL (ref 6.0–8.5)

## 2023-07-27 MED ORDER — GADOBUTROL 1 MMOL/ML IV SOLN
11.0000 mL | Freq: Once | INTRAVENOUS | Status: AC | PRN
  Administered 2023-07-27: 11 mL via INTRAVENOUS

## 2023-07-28 ENCOUNTER — Telehealth: Payer: Self-pay | Admitting: *Deleted

## 2023-07-28 ENCOUNTER — Encounter: Payer: Self-pay | Admitting: Physician Assistant

## 2023-07-28 ENCOUNTER — Other Ambulatory Visit: Payer: Self-pay | Admitting: Pharmacist

## 2023-07-28 DIAGNOSIS — C9000 Multiple myeloma not having achieved remission: Secondary | ICD-10-CM

## 2023-07-28 MED ORDER — LENALIDOMIDE 15 MG PO CAPS: 15.0000 mg | ORAL_CAPSULE | Freq: Every day | ORAL | 0 refills | Status: DC

## 2023-07-28 NOTE — Telephone Encounter (Signed)
[facility] Oneal Grout, PA called asking to discuss BMT with Dr Cathie Hoops on this patient [phone]

## 2023-07-28 NOTE — Progress Notes (Signed)
Message from Dr. Cathie Hoops on 07/28/23, "I spoke with [facility] Phillips oncology he had repeat BM which still shows marked residual disease. Victor Phillips recommend to increase Revlimid to 15mg  2 weeks on 1 week off, if he tolerates, after 1 cycle to increase to 20mg  2 weeks on 1 week off."  Rx for Revlimid 15mg  sent to [facility]

## 2023-07-29 ENCOUNTER — Other Ambulatory Visit: Payer: Medicare HMO

## 2023-07-29 ENCOUNTER — Telehealth: Payer: Self-pay | Admitting: *Deleted

## 2023-07-29 ENCOUNTER — Ambulatory Visit: Payer: Medicare HMO

## 2023-07-29 ENCOUNTER — Encounter: Payer: Self-pay | Admitting: Oncology

## 2023-07-29 NOTE — Telephone Encounter (Signed)
Dr. Cathie Hoops has called back.

## 2023-07-29 NOTE — Telephone Encounter (Signed)
Spoke with patient and reviewed documentation needed in order to process his application. He verbalized understanding and will bring all of that information to his appointment on Monday. He verbalized understanding with no further questions at this time.

## 2023-07-29 NOTE — Telephone Encounter (Signed)
-----   Message from [facility] sent at 07/29/2023 12:02 PM EST ----- Called patient.  Reviewed results.   Patient is requesting help with Eliquis - Patient reports that he has enough pills until his appt on Monday.

## 2023-08-01 ENCOUNTER — Ambulatory Visit: Payer: Medicare HMO

## 2023-08-01 ENCOUNTER — Ambulatory Visit: Payer: Medicare HMO | Attending: Cardiology | Admitting: Cardiology

## 2023-08-01 ENCOUNTER — Encounter: Payer: Self-pay | Admitting: Cardiology

## 2023-08-01 VITALS — BP 104/58 | HR 102 | Ht 67.0 in | Wt 167.4 lb

## 2023-08-01 DIAGNOSIS — I10 Essential (primary) hypertension: Secondary | ICD-10-CM

## 2023-08-01 DIAGNOSIS — C9000 Multiple myeloma not having achieved remission: Secondary | ICD-10-CM | POA: Diagnosis not present

## 2023-08-01 DIAGNOSIS — I4819 Other persistent atrial fibrillation: Secondary | ICD-10-CM | POA: Diagnosis not present

## 2023-08-01 MED ORDER — APIXABAN 5 MG PO TABS: 5.0000 mg | ORAL_TABLET | Freq: Two times a day (BID) | ORAL | 0 refills | Status: DC

## 2023-08-01 MED ORDER — METOPROLOL SUCCINATE ER 50 MG PO TB24
50.0000 mg | ORAL_TABLET | Freq: Every day | ORAL | 3 refills | Status: DC
Start: 2023-08-01 — End: 2023-10-06

## 2023-08-01 MED ORDER — APIXABAN 5 MG PO TABS: 5.0000 mg | ORAL_TABLET | Freq: Two times a day (BID) | ORAL | Status: DC

## 2023-08-01 NOTE — Patient Instructions (Addendum)
Medication Instructions:   INCREASE Eliquis - Take one tablet ( 5mg ) by mouth twice a day.  2. START Metoprolol Succinate - Take one tablet ( 50mg ) by mouth daily 3. STOP Cardizem   *If you need a refill on your cardiac medications before your next appointment, please call your pharmacy*   Lab Work:  None Ordered  If you have labs (blood work) drawn today and your tests are completely normal, you will receive your results only by: MyChart Message (if you have MyChart) OR A paper copy in the mail If you have any lab test that is abnormal or we need to change your treatment, we will call you to review the results.   Testing/Procedures:  None Ordered   Follow-Up: At [facility], you and your health needs are our priority.  As part of our continuing mission to provide you with exceptional heart care, we have created designated Provider Care Teams.  These Care Teams include your primary Cardiologist (physician) and Advanced Practice Providers (APPs -  Physician Assistants and Nurse Practitioners) who all work together to provide you with the care you need, when you need it.  We recommend signing up for the patient portal called "MyChart".  Sign up information is provided on this After Visit Summary.  MyChart is used to connect with patients for Virtual Visits (Telemedicine).  Patients are able to view lab/test results, encounter notes, upcoming appointments, etc.  Non-urgent messages can be sent to your provider as well.   To learn more about what you can do with MyChart, go to [email or web address].    Your next appointment:   2 month(s)  Provider:   You may see Debbe Odea, MD or one of the following Advanced Practice Providers on your designated Care Team:   Nicolasa Ducking, NP Eula Listen, PA-C Cadence Fransico Michael, PA-C Charlsie Quest, NP Carlos Levering, NP

## 2023-08-01 NOTE — Progress Notes (Signed)
Cardiology Office Note:    Date:  08/01/2023   ID:  Victor Phillips, DOB 02-17-1956, MRN [id number]  PCP:  Danella Penton, MD   [facility] Providers Cardiologist:  Debbe Odea, MD     Referring MD: Trinna Post, MD   Chief Complaint  Patient presents with   New Patient (Initial Visit)    Patient referred by ED for cardiac evaluation of atrial fibrillation with no personal cardiac history.    History of Present Illness:    Victor Phillips is a 67 y.o. male with a hx of persistent atrial fibrillation, hypertension, hyperlipidemia, diabetes, multiple myeloma who presents due to atrial fibrillation.  He was diagnosed with atrial fibrillation about 3 months ago after presenting to the ED due to a fall.  Was started on Eliquis 0.5 mg twice daily and Cardizem.  Denies palpitations, chest pain or shortness of breath.  Diagnosed with multiple myeloma 6 months ago, had brief episodes of thrombocytopenia with chemo treatment.  Has been off chemo the past 2 weeks, platelets levels have recovered.  Stem cell transplant was being considered.  This is currently on hold due to high tumor burden.  Cardiac MRI 07/2023 EF 50%, no LGE or LV infiltration  Echocardiogram 06/2023 EF 50 to 55%   Past Medical History:  Diagnosis Date   Atrial fibrillation (HCC)    Diabetes mellitus without complication (HCC)    Hypercholesteremia    Hypertension    Multiple myeloma not having achieved remission [facility])     Past Surgical History:  Procedure Laterality Date   COLONOSCOPY N/A 02/13/2023   Procedure: COLONOSCOPY;  Surgeon: Toledo, Boykin Nearing, MD;  Location: [facility] ENDOSCOPY;  Service: Gastroenterology;  Laterality: N/A;   COLONOSCOPY N/A 02/14/2023   Procedure: COLONOSCOPY;  Surgeon: Toledo, Boykin Nearing, MD;  Location: [facility] ENDOSCOPY;  Service: Gastroenterology;  Laterality: N/A;   ESOPHAGOGASTRODUODENOSCOPY N/A 02/13/2023   Procedure: ESOPHAGOGASTRODUODENOSCOPY (EGD);  Surgeon: Toledo, Boykin Nearing, MD;  Location: [facility] ENDOSCOPY;  Service: Gastroenterology;  Laterality: N/A;    Current Medications: Current Meds  Medication Sig   acetaminophen (TYLENOL) 325 MG tablet Take 2 tablets (650 mg total) by mouth every 6 (six) hours as needed for mild pain (or Fever >/= 101).   apixaban (ELIQUIS) 5 MG TABS tablet Take 1 tablet (5 mg total) by mouth 2 (two) times daily.   atorvastatin (LIPITOR) 10 MG tablet Take 1 tablet by mouth daily.   calcium carbonate (OS-CAL) 600 MG TABS tablet Take 2 tablets (1,200 mg total) by mouth daily.   cholecalciferol (VITAMIN D3) 25 MCG (1000 UNIT) tablet Take 1 tablet (1,000 Units total) by mouth daily.   furosemide (LASIX) 20 MG tablet Take 1 tablet (20 mg total) by mouth daily as needed for edema or fluid.   glipiZIDE (GLUCOTROL XL) 2.5 MG 24 hr tablet Take by mouth.   lenalidomide (REVLIMID) 15 MG capsule Take 1 capsule (15 mg total) by mouth daily. Take for 14 days, then hold for 7 days. Repeat every 21 days.   metoprolol succinate (TOPROL-XL) 50 MG 24 hr tablet Take 1 tablet (50 mg total) by mouth daily. Take with or immediately following a meal.   Multiple Vitamin (MULTIVITAMIN WITH MINERALS) TABS tablet Take 1 tablet by mouth daily.   polyethylene glycol (MIRALAX) 17 g packet Take 17 g by mouth daily.   senna (SENOKOT) 8.6 MG TABS tablet Take 2 tablets (17.2 mg total) by mouth daily.   temazepam (RESTORIL) 15 MG capsule Take by  mouth.   [DISCONTINUED] apixaban (ELIQUIS) 2.5 MG TABS tablet Take 1 tablet (2.5 mg total) by mouth 2 (two) times daily.   [DISCONTINUED] diltiazem (CARDIZEM CD) 120 MG 24 hr capsule Take 1 capsule (120 mg total) by mouth daily.     Allergies:   Patient has no known allergies.   Social History   Socioeconomic History   Marital status: Married    Spouse name: Not on file   Number of children: Not on file   Years of education: Not on file   Highest education level: Not on file  Occupational History   Not on file  Tobacco  Use   Smoking status: Never   Smokeless tobacco: Never  Vaping Use   Vaping status: Never Used  Substance and Sexual Activity   Alcohol use: Not Currently    Comment: quit approx 2001   Drug use: No   Sexual activity: Not on file  Other Topics Concern   Not on file  Social History Narrative   Not on file   Social Determinants of Health   Financial Resource Strain: Low Risk  (07/22/2023)   Received [facility]   Overall Financial Resource Strain (CARDIA)    Difficulty of Paying Living Expenses: Not hard at all  Recent Concern: Financial Resource Strain - High Risk (07/05/2023)   Received from [facility]   Overall Financial Resource Strain (CARDIA)    Difficulty of Paying Living Expenses: Hard  Food Insecurity: No Food Insecurity (07/22/2023)   Received from [facility]   Hunger Vital Sign    Worried About Running Out of Food in the Last Year: Never true    Ran Out of Food in the Last Year: Never true  Recent Concern: Food Insecurity - Food Insecurity Present (07/05/2023)   Received from [facility]   Hunger Vital Sign    Worried About Running Out of Food in the Last Year: Sometimes true    Ran Out of Food in the Last Year: Sometimes true  Transportation Needs: No Transportation Needs (07/22/2023)   Received from [facility] - Transportation    In the past 12 months, has lack of transportation kept you from medical appointments or from getting medications?: No    Lack of Transportation (Non-Medical): No  Physical Activity: Sufficiently Active (09/24/2020)   Received from [facility], [facility]   Exercise Vital Sign    Days of Exercise per Week: 6 days    Minutes of Exercise per Session: 30 min  Stress: No Stress Concern Present (09/24/2020)   Received from [facility], [facility]   [employer]  of Occupational Health - Occupational Stress Questionnaire    Feeling of Stress : Not at all  Social Connections: Socially Integrated (09/24/2020)   Received from [facility], [facility]   Social Connection and Isolation Panel [NHANES]    Frequency of Communication with Friends and Family: More than three times a week    Frequency of Social Gatherings with Friends and Family: Once a week    Attends Religious Services: More than 4 times per year    Active Member of Clubs or Organizations: Yes    Attends Engineer, structural: More than 4 times per year    Marital Status: Married     Family History: The patient's family history includes Diabetes in his mother;  Heart attack in his father.  ROS:   Please see the history of present illness.     All other systems reviewed and are negative.  EKGs/Labs/Other Studies Reviewed:    The following studies were reviewed today:  EKG Interpretation Date/Time:  Monday August 01 2023 12:00:07 EST Ventricular Rate:  102 PR Interval:    QRS Duration:  84 QT Interval:  350 QTC Calculation: 456 R Axis:   12  Text Interpretation: Atrial fibrillation with rapid ventricular response Nonspecific T wave abnormality Confirmed by Debbe Odea ([id number]) on 08/01/2023 12:11:19 PM    Recent Labs: 02/11/2023: B Natriuretic Peptide 267.2 07/15/2023: Magnesium 3.1 07/22/2023: ALT 32; BUN 20; Creatinine 1.14; Hemoglobin 9.6; Platelet Count 160; Potassium 4.8; Sodium 134  Recent Lipid Panel    Component Value Date/Time   CHOL 61 02/13/2023 0329   TRIG 74 02/13/2023 0329   HDL 24 (L) 02/13/2023 0329   CHOLHDL 2.5 02/13/2023 0329   VLDL 15 02/13/2023 0329   LDLCALC 22 02/13/2023 0329     Risk Assessment/Calculations:             Physical Exam:    VS:  BP (!) 104/58 (BP Location: Left Arm, Patient Position: Sitting, Cuff Size: Normal)   Pulse (!) 102   Ht 5\' 7"  (1.702 m)   Wt 167 lb 6.4 oz (75.9 kg)    SpO2 98%   BMI 26.22 kg/m     Wt Readings from Last 3 Encounters:  08/01/23 167 lb 6.4 oz (75.9 kg)  07/22/23 179 lb 3.2 oz (81.3 kg)  07/14/23 176 lb (79.8 kg)     GEN:  Well nourished, well developed in no acute distress HEENT: Normal NECK: No JVD; No carotid bruits CARDIAC: Irregular irregular RESPIRATORY:  Clear to auscultation without rales, wheezing or rhonchi  ABDOMEN: Soft, non-tender, non-distended MUSCULOSKELETAL:  No edema; No deformity  SKIN: Warm and dry NEUROLOGIC:  Alert and oriented x 3 PSYCHIATRIC:  Normal affect   ASSESSMENT:    1. Persistent atrial fibrillation (HCC)   2. Primary hypertension   3. Multiple myeloma, remission status unspecified (HCC)    PLAN:    In order of problems listed above:  Persistent A-fib, BP low normal.  EKG showing A-fib, clinically asymptomatic.  Stop Cardizem, start Toprol-XL 50 mg daily, increase Eliquis to 5 mg twice daily.  Echo 10/24 EF 50 to 55%, normal LA size.  If cost of Eliquis prohibitive, will consider Coumadin.  Patient assistance paperwork to be provided. Hypertension, BP controlled.  Toprol-XL as above. Multiple myeloma, management as per oncology.  Follow-up in 2 months.      Medication Adjustments/Labs and Tests Ordered: Current medicines are reviewed at length with the patient today.  Concerns regarding medicines are outlined above.  Orders Placed This Encounter  Procedures   EKG 12-Lead   Meds ordered this encounter  Medications   apixaban (ELIQUIS) 5 MG TABS tablet    Sig: Take 1 tablet (5 mg total) by mouth 2 (two) times daily.    Dispense:  180 tablet    Refill:  0   metoprolol succinate (TOPROL-XL) 50 MG 24 hr tablet    Sig: Take 1 tablet (50 mg total) by mouth daily. Take with or immediately following a meal.    Dispense:  90 tablet    Refill:  3   apixaban (ELIQUIS) 5 MG TABS tablet    Sig: Take 1 tablet (5 mg total) by mouth 2 (two) times daily.    Dispense:  70 tablet    Order  Specific Question:   Lot Number?    Answer:   [id number]    Order Specific Question:   Expiration Date?    Answer:   11/11/2024    Order Specific Question:   Quantity    Answer:   70    Comments:   tablets    Patient Instructions  Medication Instructions:   INCREASE Eliquis - Take one tablet ( 5mg ) by mouth twice a day.  2. START Metoprolol Succinate - Take one tablet ( 50mg ) by mouth daily 3. STOP Cardizem   *If you need a refill on your cardiac medications before your next appointment, please call your pharmacy*   Lab Work:  None Ordered  If you have labs (blood work) drawn today and your tests are completely normal, you will receive your results only by: MyChart Message (if you have MyChart) OR A paper copy in the mail If you have any lab test that is abnormal or we need to change your treatment, we will call you to review the results.   Testing/Procedures:  None Ordered   Follow-Up: At [facility], you and your health needs are our priority.  As part of our continuing mission to provide you with exceptional heart care, we have created designated Provider Care Teams.  These Care Teams include your primary Cardiologist (physician) and Advanced Practice Providers (APPs -  Physician Assistants and Nurse Practitioners) who all work together to provide you with the care you need, when you need it.  We recommend signing up for the patient portal called "MyChart".  Sign up information is provided on this After Visit Summary.  MyChart is used to connect with patients for Virtual Visits (Telemedicine).  Patients are able to view lab/test results, encounter notes, upcoming appointments, etc.  Non-urgent messages can be sent to your provider as well.   To learn more about what you can do with MyChart, go to [email or web address].    Your next appointment:   2 month(s)  Provider:   You may see Debbe Odea, MD or one of the following Advanced Practice Providers on your  designated Care Team:   Nicolasa Ducking, NP Eula Listen, PA-C Cadence Fransico Michael, PA-C Charlsie Quest, NP Carlos Levering, NP   Signed, Debbe Odea, MD  08/01/2023 12:56 PM    [facility]

## 2023-08-02 ENCOUNTER — Other Ambulatory Visit: Payer: Self-pay

## 2023-08-05 ENCOUNTER — Inpatient Hospital Stay: Payer: Medicare HMO

## 2023-08-05 ENCOUNTER — Inpatient Hospital Stay (HOSPITAL_BASED_OUTPATIENT_CLINIC_OR_DEPARTMENT_OTHER): Payer: Medicare HMO | Admitting: Oncology

## 2023-08-05 ENCOUNTER — Ambulatory Visit: Payer: Medicare HMO | Admitting: Cardiology

## 2023-08-05 ENCOUNTER — Encounter: Payer: Self-pay | Admitting: Oncology

## 2023-08-05 VITALS — BP 140/94 | HR 83 | Temp 97.2°F | Resp 18 | Wt 167.5 lb

## 2023-08-05 DIAGNOSIS — C9000 Multiple myeloma not having achieved remission: Secondary | ICD-10-CM

## 2023-08-05 DIAGNOSIS — I4891 Unspecified atrial fibrillation: Secondary | ICD-10-CM | POA: Diagnosis not present

## 2023-08-05 DIAGNOSIS — N1832 Chronic kidney disease, stage 3b: Secondary | ICD-10-CM | POA: Diagnosis not present

## 2023-08-05 DIAGNOSIS — Z5112 Encounter for antineoplastic immunotherapy: Secondary | ICD-10-CM | POA: Diagnosis not present

## 2023-08-05 DIAGNOSIS — Z5111 Encounter for antineoplastic chemotherapy: Secondary | ICD-10-CM | POA: Diagnosis not present

## 2023-08-05 DIAGNOSIS — D696 Thrombocytopenia, unspecified: Secondary | ICD-10-CM

## 2023-08-05 LAB — CMP (CANCER CENTER ONLY)
ALT: 21 U/L (ref 0–44)
AST: 16 U/L (ref 15–41)
Albumin: 3.6 g/dL (ref 3.5–5.0)
Alkaline Phosphatase: 113 U/L (ref 38–126)
Anion gap: 12 (ref 5–15)
BUN: 25 mg/dL — ABNORMAL HIGH (ref 8–23)
CO2: 23 mmol/L (ref 22–32)
Calcium: 8.3 mg/dL — ABNORMAL LOW (ref 8.9–10.3)
Chloride: 105 mmol/L (ref 98–111)
Creatinine: 0.98 mg/dL (ref 0.61–1.24)
GFR, Estimated: >60 mL/min (ref >60)
Glucose, Bld: 201 mg/dL — ABNORMAL HIGH (ref 70–99)
Potassium: 4.1 mmol/L (ref 3.5–5.1)
Sodium: 140 mmol/L (ref 135–145)
Total Bilirubin: 1 mg/dL (ref <1.2)
Total Protein: 7.5 g/dL (ref 6.5–8.1)

## 2023-08-05 LAB — CBC WITH DIFFERENTIAL (CANCER CENTER ONLY)
Abs Immature Granulocytes: 0.02 10*3/uL (ref 0.00–0.07)
Basophils Absolute: 0 10*3/uL (ref 0.0–0.1)
Basophils Relative: 1 %
Eosinophils Absolute: 0.1 10*3/uL (ref 0.0–0.5)
Eosinophils Relative: 2 %
HCT: 31.5 % — ABNORMAL LOW (ref 39.0–52.0)
Hemoglobin: 10.6 g/dL — ABNORMAL LOW (ref 13.0–17.0)
Immature Granulocytes: 0 %
Lymphocytes Relative: 11 %
Lymphs Abs: 0.6 10*3/uL — ABNORMAL LOW (ref 0.7–4.0)
MCH: 33 pg (ref 26.0–34.0)
MCHC: 33.7 g/dL (ref 30.0–36.0)
MCV: 98.1 fL (ref 80.0–100.0)
Monocytes Absolute: 0.4 10*3/uL (ref 0.1–1.0)
Monocytes Relative: 7 %
Neutro Abs: 4.7 10*3/uL (ref 1.7–7.7)
Neutrophils Relative %: 79 %
Platelet Count: 198 10*3/uL (ref 150–400)
RBC: 3.21 MIL/uL — ABNORMAL LOW (ref 4.22–5.81)
RDW: 15.1 % (ref 11.5–15.5)
WBC Count: 5.9 10*3/uL (ref 4.0–10.5)
nRBC: 0 % (ref 0.0–0.2)

## 2023-08-05 MED ORDER — DIPHENHYDRAMINE HCL 25 MG PO CAPS
50.0000 mg | ORAL_CAPSULE | Freq: Once | ORAL | Status: AC
  Administered 2023-08-05: 50 mg via ORAL
  Filled 2023-08-05: qty 2

## 2023-08-05 MED ORDER — ACETAMINOPHEN 325 MG PO TABS
650.0000 mg | ORAL_TABLET | Freq: Once | ORAL | Status: AC
  Administered 2023-08-05: 650 mg via ORAL
  Filled 2023-08-05: qty 2

## 2023-08-05 MED ORDER — DEXAMETHASONE 4 MG PO TABS
20.0000 mg | ORAL_TABLET | Freq: Once | ORAL | Status: AC
  Administered 2023-08-05: 20 mg via ORAL
  Filled 2023-08-05: qty 5

## 2023-08-05 MED ORDER — BORTEZOMIB CHEMO SQ INJECTION 3.5 MG (2.5MG/ML)
1.3000 mg/m2 | Freq: Once | INTRAMUSCULAR | Status: AC
Start: 2023-08-05 — End: 2023-08-05
  Administered 2023-08-05: 2.5 mg via SUBCUTANEOUS
  Filled 2023-08-05: qty 1

## 2023-08-05 MED ORDER — DARATUMUMAB-HYALURONIDASE-FIHJ 1800-30000 MG-UT/15ML ~~LOC~~ SOLN
1800.0000 mg | Freq: Once | SUBCUTANEOUS | Status: AC
  Administered 2023-08-05: 1800 mg via SUBCUTANEOUS
  Filled 2023-08-05: qty 15

## 2023-08-05 NOTE — Assessment & Plan Note (Signed)
Encourage oral hydration and avoid nephrotoxins.   

## 2023-08-05 NOTE — Assessment & Plan Note (Signed)
Chemotherapy plan as listed above. - hold treatment for now due to recent Ileus. pending decision of BM transplant

## 2023-08-05 NOTE — Patient Instructions (Signed)
[facility] - A DEPT OF [facility]  Discharge Instructions: Thank you for choosing [facility] to provide your oncology and hematology care.  If you have a lab appointment with the Cancer Center, please go directly to the Cancer Center and check in at the registration area.  Wear comfortable clothing and clothing appropriate for easy access to any Portacath or PICC line.   We strive to give you quality time with your provider. You may need to reschedule your appointment if you arrive late (15 or more minutes).  Arriving late affects you and other patients whose appointments are after yours.  Also, if you miss three or more appointments without notifying the office, you may be dismissed from the clinic at the provider's discretion.      For prescription refill requests, have your pharmacy contact our office and allow 72 hours for refills to be completed.    Today you received the following chemotherapy and/or immunotherapy agents Darzalex & Velcade      To help prevent nausea and vomiting after your treatment, we encourage you to take your nausea medication as directed.  BELOW ARE SYMPTOMS THAT SHOULD BE REPORTED IMMEDIATELY: *FEVER GREATER THAN 100.4 F (38 C) OR HIGHER *CHILLS OR SWEATING *NAUSEA AND VOMITING THAT IS NOT CONTROLLED WITH YOUR NAUSEA MEDICATION *UNUSUAL SHORTNESS OF BREATH *UNUSUAL BRUISING OR BLEEDING *URINARY PROBLEMS (pain or burning when urinating, or frequent urination) *BOWEL PROBLEMS (unusual diarrhea, constipation, pain near the anus) TENDERNESS IN MOUTH AND THROAT WITH OR WITHOUT PRESENCE OF ULCERS (sore throat, sores in mouth, or a toothache) UNUSUAL RASH, SWELLING OR PAIN  UNUSUAL VAGINAL DISCHARGE OR ITCHING   Items with * indicate a potential emergency and should be followed up as soon as possible or go to the Emergency Department if any problems should occur.  Please show the CHEMOTHERAPY ALERT CARD or  IMMUNOTHERAPY ALERT CARD at check-in to the Emergency Department and triage nurse.  Should you have questions after your visit or need to cancel or reschedule your appointment, please contact [facility] - A DEPT OF [facility]  [phone] and follow the prompts.  Office hours are 8:00 a.m. to 4:30 p.m. Monday - Friday. Please note that voicemails left after 4:00 p.m. may not be returned until the following business day.  We are closed weekends and major holidays. You have access to a nurse at all times for urgent questions. Please call the main number to the clinic [phone] and follow the prompts.  For any non-urgent questions, you may also contact your provider using MyChart. We now offer e-Visits for anyone 97 and older to request care online for non-urgent symptoms. For details visit [email or web address].   Also download the MyChart app! Go to the app store, search "MyChart", open the app, select [facility], and log in with your MyChart username and password.

## 2023-08-05 NOTE — Assessment & Plan Note (Addendum)
Echo was done on 06/14/23 LVEF 50% Follow up with cardiology for evaluation.  Continue Eliquis 5mg  BID

## 2023-08-05 NOTE — Assessment & Plan Note (Signed)
Recommend patient to take calcium 1200mg  daily. Take vitamin D supplementation.

## 2023-08-05 NOTE — Progress Notes (Addendum)
Hematology/Oncology Progress note [phone] [phone]         Patient Care Team: Danella Penton, MD as PCP - General (Internal Medicine) Debbe Odea, MD as PCP - Cardiology (Cardiology) [name], MD as Consulting Physician (Oncology)  CHIEF COMPLAINTS/REASON FOR VISIT:  Multiple myeloma   ASSESSMENT & PLAN:   Cancer Staging  Multiple myeloma [facility]) Staging form: Plasma Cell Myeloma and Plasma Cell Disorders, AJCC 8th Edition - Clinical stage from 04/14/2023: RISS Stage II (Beta-2-microglobulin (mg/L): 8.4, Albumin (g/dL): 2.6, ISS: Stage III, High-risk cytogenetics: Absent, LDH: Normal) - Signed by [name], MD on 04/22/2023   Multiple myeloma (HCC) Bone marrow biopsy was reviewed. 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 Cycle 3 D15 Daratumumab was held due to cytopenia and being on anticoagulation.  S/p 4 cycles  Daratumumab and Velcade with revlimid 10mg  2 weeks on 1 week off  Cycle 4 D15 missed due to hospitalization due to ileus. BM biopsy at [facility] after 4 cycles showed 50-60% plasma cells.  Labs are reviewed and discussed with patient. Discussed with [facility]. Recommend additional 3-4 cycles.  Proceed with cycle 5 Dara Rvd, increase Revlimid to 15mg  2 weeks on 1 week off  Continue Acyclovir, At high risk of thrombosis, on Eliquis 5mg  BID for A fib Xegeva monthly- hold resuming due to hypocalcemia.  Recommend calcium and vitamin D supplementation.   Follow up with [facility] in the future evaluation for BM transplant - no amyloidosis on cardiac MRI  CKD (chronic kidney disease) stage 3, GFR 30-59 ml/min (HCC) Encourage oral hydration and avoid nephrotoxins.    Encounter for antineoplastic chemotherapy Chemotherapy plan as listed above. - hold treatment for now due to recent Ileus. pending  decision of BM transplant    Atrial fibrillation (HCC) Echo was done on 06/14/23 LVEF 50% Follow up with cardiology for evaluation.  Continue Eliquis 5mg  BID   Thrombocytopenia (HCC) Chemotherapy induced. Close monitor  Hypocalcemia Recommend patient to take calcium 1200mg  daily. Take vitamin D supplementation.     Orders Placed This Encounter  Procedures   Multiple Myeloma Panel (SPEP&IFE w/QIG)    Standing Status:   Future    Standing Expiration Date:   08/28/2024   Kappa/lambda light chains    Standing Status:   Future    Standing Expiration Date:   08/28/2024   CBC with Differential (Cancer Center Only)    Standing Status:   Future    Standing Expiration Date:   08/28/2024   CMP (Cancer Center only)    Standing Status:   Future    Standing Expiration Date:   08/28/2024   CBC with Differential (Cancer Center Only)    Standing Status:   Future    Standing Expiration Date:   09/04/2024   CMP (Cancer Center only)    Standing Status:   Future    Standing Expiration Date:   09/04/2024   Follow-up per LOS  We spent sufficient time to discuss many aspect of care, questions were answered to patient's satisfaction.    [name], MD, PhD [facility] Hematology Oncology 08/05/2023     HISTORY OF PRESENTING ILLNESS:  Victor Phillips is a  67 y.o.  male with PMH listed below who was referred to me for anemia  02/11/2023 - 02/14/2023 recent hospitalization due to pneumonia, respiratory failure.  He was found to have a hemoglobin decreased at 6.8, status post PRBC transfusion during admission.  EGD showed gastritis.  Colonoscopy was not remarkable. 02/11/2023 TIBC 221 ferritin 107, iron saturation 16. Patient is currently taking fusion plus Vitamin B12 level in the 300s. His echo showed grade 2 diastolic CHF.  He denies recent chest pain on exertion, shortness of breath on minimal exertion, pre-syncopal episodes, or palpitations He had not noticed any recent bleeding such as  epistaxis, hematuria or hematochezia.  He denies over the counter NSAID ingestion.  Oncology History  Multiple myeloma (HCC)  04/14/2023 Initial Diagnosis   Multiple myeloma   03/13/2020 multiple myeloma panel showed M protein 4.3, IgA lambda Free lamda Level 142,-light chain ratio 0.07 04/05/2023 bone marrow biopsy showed hypercellular bone marrow with plasma cell neoplasm.The bone marrow is hypercellular for age with prominent increase in plasma cells representing 65% of all cells in the aspirate associated with interstitial infiltrates and numerous variably sized aggregates in  the clot and biopsy sections.  The plasma cells display lambda light chain restriction consistent with plasma cell neoplasm.  Normal cytogenetics.  FISH studies pending.    04/14/2023 Cancer Staging   Staging form: Plasma Cell Myeloma and Plasma Cell Disorders, AJCC 8th Edition - Clinical stage from 04/14/2023: RISS Stage II (Beta-2-microglobulin (mg/L): 8.4, Albumin (g/dL): 2.6, ISS: Stage III, High-risk cytogenetics: Absent, LDH: Normal) - Signed by [name], MD on 04/22/2023 Stage prefix: Initial diagnosis Beta 2 microglobulin range (mg/L): Greater than or equal to 5.5 Albumin range (g/dL): Less than 3.5 Cytogenetics: 1q addition, Other mutation   04/14/2023 Imaging   Skeletal survey 1. No lytic lesions or other intrinsic bony abnormality. 2. Borderline cardiomegaly with an interval decrease in size. 3. Diffuse atheromatous arterial calcifications including bilateral carotid artery calcifications, right greater than left   04/22/2023 -  Chemotherapy   Patient is on Treatment Plan : MYELOMA NEWLY DIAGNOSED TRANSPLANT CANDIDATE DaraVRd (Daratumumab SQ) q21d     04/25/2023 Imaging   PET scan showed  1. Two tiny hypermetabolic lucent lesions in the left posteromedial eighth and ninth ribs. No additional evidence of multiple myeloma. 2. Aortic atherosclerosis (ICD10-I70.0). Coronary artery calcification.   07/14/2023 -  07/19/2023 Hospital Admission   Hospitalized due to ileus.    07/25/2023 Bone Marrow Biopsy   A-C. Peripheral blood and bone marrow, (peripheral smear, aspirate smear, touch preparation, clot section, core biopsy):   Persistent plasma cell myeloma, 50-60% lambda restricted plasma cells on core biopsy. Hypercellular bone marrow (70%) with trilineage hematopoiesis. Negative for increased blasts. Negative for significant dysplasia. Negative for amyloid deposition.     A Fib. Lowe extremity edema bilaterally, and SOB with exertion.  Now on Eliquis 5mg  BID, follows up with cardiology.  Denies chest pain, abdominal pain, nausea, vomiting, diarrhea.     MEDICAL HISTORY:  Past Medical History:  Diagnosis Date   Atrial fibrillation (HCC)    Diabetes mellitus without complication (HCC)    Hypercholesteremia    Hypertension    Multiple myeloma not having achieved remission [facility])     SURGICAL HISTORY: Past Surgical History:  Procedure Laterality Date   COLONOSCOPY N/A 02/13/2023   Procedure: COLONOSCOPY;  Surgeon: Toledo, Boykin Nearing, MD;  Location: [facility] ENDOSCOPY;  Service: Gastroenterology;  Laterality: N/A;   COLONOSCOPY N/A 02/14/2023   Procedure: COLONOSCOPY;  Surgeon: Toledo, Boykin Nearing, MD;  Location: [facility] ENDOSCOPY;  Service: Gastroenterology;  Laterality: N/A;   ESOPHAGOGASTRODUODENOSCOPY N/A 02/13/2023   Procedure: ESOPHAGOGASTRODUODENOSCOPY (EGD);  Surgeon: [place], [place],  MD;  Location: [facility] ENDOSCOPY;  Service: Gastroenterology;  Laterality: N/A;    SOCIAL HISTORY: Social History   Socioeconomic History   Marital status: Married    Spouse name: Not on file   Number of children: Not on file   Years of education: Not on file   Highest education level: Not on file  Occupational History   Not on file  Tobacco Use   Smoking status: Never   Smokeless tobacco: Never  Vaping Use   Vaping status: Never Used  Substance and Sexual Activity   Alcohol use: Not Currently     Comment: quit approx 2001   Drug use: No   Sexual activity: Not on file  Other Topics Concern   Not on file  Social History Narrative   Not on file   Social Determinants of Health   Financial Resource Strain: Low Risk  (07/22/2023)   Received [facility]   Overall Financial Resource Strain (CARDIA)    Difficulty of Paying Living Expenses: Not hard at all  Recent Concern: Financial Resource Strain - High Risk (07/05/2023)   Received from [facility]   Overall Financial Resource Strain (CARDIA)    Difficulty of Paying Living Expenses: Hard  Food Insecurity: No Food Insecurity (07/22/2023)   Received from [facility]   Hunger Vital Sign    Worried About Running Out of Food in the Last Year: Never true    Ran Out of Food in the Last Year: Never true  Recent Concern: Food Insecurity - Food Insecurity Present (07/05/2023)   Received from [facility]   Hunger Vital Sign    Worried About Running Out of Food in the Last Year: Sometimes true    Ran Out of Food in the Last Year: Sometimes true  Transportation Needs: No Transportation Needs (07/22/2023)   Received from [facility] - Transportation    In the past 12 months, has lack of transportation kept you from medical appointments or from getting medications?: No    Lack of Transportation (Non-Medical): No  Physical Activity: Sufficiently Active (09/24/2020)   Received from [facility], [facility]   Exercise Vital Sign    Days of Exercise per Week: 6 days    Minutes of Exercise per Session: 30 min  Stress: No Stress Concern Present (09/24/2020)   Received from [facility], [facility]   [employer] of Occupational Health - Occupational Stress Questionnaire    Feeling of Stress : Not at all  Social Connections: Socially Integrated (09/24/2020)   Received from  [facility], [facility]   Social Connection and Isolation Panel [NHANES]    Frequency of Communication with Friends and Family: More than three times a week    Frequency of Social Gatherings with Friends and Family: Once a week    Attends Religious Services: More than 4 times per year    Active Member of Clubs or Organizations: Yes    Attends Engineer, structural: More than 4 times per year    Marital Status: Married  Catering manager Violence: Not At Risk (07/14/2023)   Humiliation, Afraid, Rape, and Kick questionnaire    Fear of Current or Ex-Partner: No    Emotionally Abused: No    Physically Abused: No    Sexually Abused: No    FAMILY HISTORY: Family History  Problem Relation Age of  Onset   Diabetes Mother    Heart attack Father     ALLERGIES:  has No Known Allergies.  MEDICATIONS:  Current Outpatient Medications  Medication Sig Dispense Refill   acyclovir (ZOVIRAX) 400 MG tablet Take 1 tablet (400 mg total) by mouth 2 (two) times daily. 60 tablet 5   apixaban (ELIQUIS) 5 MG TABS tablet Take 1 tablet (5 mg total) by mouth 2 (two) times daily. 180 tablet 0   atorvastatin (LIPITOR) 10 MG tablet Take 1 tablet by mouth daily.     calcium carbonate (OS-CAL) 600 MG TABS tablet Take 2 tablets (1,200 mg total) by mouth daily.     cholecalciferol (VITAMIN D3) 25 MCG (1000 UNIT) tablet Take 1 tablet (1,000 Units total) by mouth daily.     furosemide (LASIX) 20 MG tablet Take 1 tablet (20 mg total) by mouth daily as needed for edema or fluid. 30 tablet 0   glipiZIDE (GLUCOTROL XL) 2.5 MG 24 hr tablet Take by mouth.     lenalidomide (REVLIMID) 15 MG capsule Take 1 capsule (15 mg total) by mouth daily. Take for 14 days, then hold for 7 days. Repeat every 21 days. 14 capsule 0   metoprolol succinate (TOPROL-XL) 50 MG 24 hr tablet Take 1 tablet (50 mg total) by mouth daily. Take with or immediately following a meal. 90 tablet 3   Multiple  Vitamin (MULTIVITAMIN WITH MINERALS) TABS tablet Take 1 tablet by mouth daily. 90 tablet 1   ondansetron (ZOFRAN) 8 MG tablet Take 1 tablet (8 mg total) by mouth every 8 (eight) hours as needed for nausea or vomiting. 30 tablet 1   pantoprazole (PROTONIX) 40 MG tablet Take 1 tablet (40 mg total) by mouth daily. 30 tablet 1   polyethylene glycol (MIRALAX) 17 g packet Take 17 g by mouth daily. 14 each 0   prochlorperazine (COMPAZINE) 10 MG tablet Take 1 tablet (10 mg total) by mouth every 6 (six) hours as needed for nausea or vomiting. 30 tablet 1   senna (SENOKOT) 8.6 MG TABS tablet Take 2 tablets (17.2 mg total) by mouth daily. 120 tablet 0   temazepam (RESTORIL) 15 MG capsule Take by mouth.     acetaminophen (TYLENOL) 325 MG tablet Take 2 tablets (650 mg total) by mouth every 6 (six) hours as needed for mild pain (or Fever >/= 101). (Patient not taking: Reported on 08/05/2023) 90 tablet 1   apixaban (ELIQUIS) 5 MG TABS tablet Take 1 tablet (5 mg total) by mouth 2 (two) times daily. (Patient not taking: Reported on 08/05/2023) 70 tablet    No current facility-administered medications for this visit.    Review of Systems  Constitutional:  Positive for fatigue. Negative for appetite change, chills, fever and unexpected weight change.  HENT:   Negative for hearing loss and voice change.   Eyes:  Negative for eye problems and icterus.  Respiratory:  Negative for chest tightness, cough and shortness of breath.   Cardiovascular:  Positive for leg swelling. Negative for chest pain.  Gastrointestinal:  Negative for abdominal distention and abdominal pain.  Endocrine: Negative for hot flashes.  Genitourinary:  Negative for difficulty urinating, dysuria and frequency.   Musculoskeletal:  Negative for arthralgias.  Skin:  Negative for itching and rash.  Neurological:  Negative for light-headedness and numbness.  Hematological:  Negative for adenopathy. Does not bruise/bleed easily.   Psychiatric/Behavioral:  Negative for confusion.     PHYSICAL EXAMINATION: Vitals:   08/05/23 0845  BP: (!) 140/94  Pulse: 83  Resp: 18  Temp: (!) 97.2 F (36.2 C)   Filed Weights   08/05/23 0845  Weight: 167 lb 8 oz (76 kg)    Physical Exam Constitutional:      General: He is not in acute distress.    Appearance: He is obese.  HENT:     Head: Normocephalic and atraumatic.  Eyes:     General: No scleral icterus. Cardiovascular:     Rate and Rhythm: Normal rate and regular rhythm.  Pulmonary:     Effort: Pulmonary effort is normal. No respiratory distress.     Comments: Crackles at the base of lung  Abdominal:     General: Bowel sounds are normal. There is no distension.     Palpations: Abdomen is soft.  Musculoskeletal:        General: No deformity. Normal range of motion.     Cervical back: Normal range of motion and neck supple.     Right lower leg: Edema present.     Left lower leg: Edema present.  Skin:    General: Skin is warm and dry.     Findings: No erythema or rash.  Neurological:     Mental Status: He is alert and oriented to person, place, and time. Mental status is at baseline.     Cranial Nerves: No cranial nerve deficit.  Psychiatric:        Mood and Affect: Mood normal.      LABORATORY DATA:  I have reviewed the data as listed    Latest Ref Rng & Units 08/05/2023    8:22 AM 07/22/2023    7:53 AM 07/19/2023    3:05 AM  CBC  WBC 4.0 - 10.5 K/uL 5.9  2.9  4.8   Hemoglobin 13.0 - 17.0 g/dL 11.9  9.6  8.6   Hematocrit 39.0 - 52.0 % 31.5  29.0  25.4   Platelets 150 - 400 K/uL 198  160  66       Latest Ref Rng & Units 08/05/2023    8:24 AM 07/22/2023    7:53 AM 07/18/2023    8:17 AM  CMP  Glucose 70 - 99 mg/dL 147  829  562   BUN 8 - 23 mg/dL 25  20  19    Creatinine 0.61 - 1.24 mg/dL 1.30  8.65  7.84   Sodium 135 - 145 mmol/L 140  134  134   Potassium 3.5 - 5.1 mmol/L 4.1  4.8  4.8   Chloride 98 - 111 mmol/L 105  104  104   CO2 22 - 32  mmol/L 23  24  22    Calcium 8.9 - 10.3 mg/dL 8.3  7.8  6.9   Total Protein 6.5 - 8.1 g/dL 7.5  6.9    Total Bilirubin <1.2 mg/dL 1.0  1.0    Alkaline Phos 38 - 126 U/L 113  107    AST 15 - 41 U/L 16  16    ALT 0 - 44 U/L 21  32     Lab Results  Component Value Date   IRON 100 03/14/2023   TIBC 272 03/14/2023   IRONPCTSAT 37 03/14/2023   FERRITIN 249 03/14/2023     RADIOGRAPHIC STUDIES: I have personally reviewed the radiological images as listed and agreed with the findings in the report. MR CARDIAC MORPHOLOGY W WO CONTRAST  Result Date: 07/27/2023 CLINICAL DATA:  Hx of multiple myeloma.  Evaluate for amyloidosis EXAM: CARDIAC MRI TECHNIQUE: The patient  was scanned on a 1.5 Tesla Siemens magnet. A dedicated cardiac coil was used. Functional imaging was done using Fiesta sequences. 2,3, and 4 chamber views were done to assess for RWMA's. Modified Simpson's rule using a short axis stack was used to calculate an ejection fraction on a dedicated work Research officer, trade union. The patient received 11 cc of Gadavist. After 10 minutes inversion recovery sequences were used to assess for infiltration and scar tissue. Velocity flow mapping performed in the ascending aorta and main pulmonary artery. CONTRAST:  11 cc  of Gadavist FINDINGS: 1. Normal left ventricular size, thickness and systolic function (LVEF = 50%). There are no regional wall motion abnormalities. There is no late gadolinium enhancement in the left ventricular myocardium. LVEDV: 133 ml LVESV: 67 ml SV: 66 ml CO: 6.1 L/min Myocardial mass: 118 g LV native T1 value 1092 ms (normal < 1000 ms). LV ECV 27.4%. (Normal <30%). 2. Normal right ventricular size, thickness and systolic function (RVEF = 42%). There are no regional wall motion abnormalities. 3.  Mildly dilated left atrial size.  Normal right atrial size. 4. Normal size of the aortic root, ascending aorta and pulmonary artery. 5.  No significant valvular abnormalities. 6.  Normal  pericardium.  No pericardial effusion. IMPRESSION: 1.  Normal LV size, low normal systolic function.  LVEF 50% 2.  No LV LGE or scar. 3.  Normal ECV, no evidence for infiltrative disease. 4.  No significant valvular abnormalities. 5.  Normal RV systolic function. 6.  No evidence for cardiac amyloidosis. Electronically Signed   By: Debbe Odea M.D.   On: 07/27/2023 17:55   MR CARDIAC VELOCITY FLOW MAP  Result Date: 07/27/2023 CLINICAL DATA:  Hx of multiple myeloma.  Evaluate for amyloidosis EXAM: CARDIAC MRI TECHNIQUE: The patient was scanned on a 1.5 Tesla Siemens magnet. A dedicated cardiac coil was used. Functional imaging was done using Fiesta sequences. 2,3, and 4 chamber views were done to assess for RWMA's. Modified Simpson's rule using a short axis stack was used to calculate an ejection fraction on a dedicated work Research officer, trade union. The patient received 11 cc of Gadavist. After 10 minutes inversion recovery sequences were used to assess for infiltration and scar tissue. Velocity flow mapping performed in the ascending aorta and main pulmonary artery. CONTRAST:  11 cc  of Gadavist FINDINGS: 1. Normal left ventricular size, thickness and systolic function (LVEF = 50%). There are no regional wall motion abnormalities. There is no late gadolinium enhancement in the left ventricular myocardium. LVEDV: 133 ml LVESV: 67 ml SV: 66 ml CO: 6.1 L/min Myocardial mass: 118 g LV native T1 value 1092 ms (normal < 1000 ms). LV ECV 27.4%. (Normal <30%). 2. Normal right ventricular size, thickness and systolic function (RVEF = 42%). There are no regional wall motion abnormalities. 3.  Mildly dilated left atrial size.  Normal right atrial size. 4. Normal size of the aortic root, ascending aorta and pulmonary artery. 5.  No significant valvular abnormalities. 6.  Normal pericardium.  No pericardial effusion. IMPRESSION: 1.  Normal LV size, low normal systolic function.  LVEF 50% 2.  No LV LGE or scar.  3.  Normal ECV, no evidence for infiltrative disease. 4.  No significant valvular abnormalities. 5.  Normal RV systolic function. 6.  No evidence for cardiac amyloidosis. Electronically Signed   By: Debbe Odea M.D.   On: 07/27/2023 17:55   MR CARDIAC VELOCITY FLOW MAP  Result Date: 07/27/2023 CLINICAL DATA:  Hx of  multiple myeloma.  Evaluate for amyloidosis EXAM: CARDIAC MRI TECHNIQUE: The patient was scanned on a 1.5 Tesla Siemens magnet. A dedicated cardiac coil was used. Functional imaging was done using Fiesta sequences. 2,3, and 4 chamber views were done to assess for RWMA's. Modified Simpson's rule using a short axis stack was used to calculate an ejection fraction on a dedicated work Research officer, trade union. The patient received 11 cc of Gadavist. After 10 minutes inversion recovery sequences were used to assess for infiltration and scar tissue. Velocity flow mapping performed in the ascending aorta and main pulmonary artery. CONTRAST:  11 cc  of Gadavist FINDINGS: 1. Normal left ventricular size, thickness and systolic function (LVEF = 50%). There are no regional wall motion abnormalities. There is no late gadolinium enhancement in the left ventricular myocardium. LVEDV: 133 ml LVESV: 67 ml SV: 66 ml CO: 6.1 L/min Myocardial mass: 118 g LV native T1 value 1092 ms (normal < 1000 ms). LV ECV 27.4%. (Normal <30%). 2. Normal right ventricular size, thickness and systolic function (RVEF = 42%). There are no regional wall motion abnormalities. 3.  Mildly dilated left atrial size.  Normal right atrial size. 4. Normal size of the aortic root, ascending aorta and pulmonary artery. 5.  No significant valvular abnormalities. 6.  Normal pericardium.  No pericardial effusion. IMPRESSION: 1.  Normal LV size, low normal systolic function.  LVEF 50% 2.  No LV LGE or scar. 3.  Normal ECV, no evidence for infiltrative disease. 4.  No significant valvular abnormalities. 5.  Normal RV systolic function. 6.   No evidence for cardiac amyloidosis. Electronically Signed   By: Debbe Odea M.D.   On: 07/27/2023 17:55   DG Abd 1 View  Result Date: 07/17/2023 CLINICAL DATA:  Distension EXAM: ABDOMEN - 1 VIEW COMPARISON:  07/14/2023, CT 07/14/2023 FINDINGS: Removal of esophageal tube.persistent gaseous dilatation of right abdominal small bowel loops measuring up to 3.5 cm with mild diffuse air distension of the colon. No radiopaque calculi. IMPRESSION: Persistent gaseous dilatation of right abdominal small bowel loops with mild diffuse air distension of the colon. Findings could be secondary to ileus or partial obstruction. Electronically Signed   By: Jasmine Pang M.D.   On: 07/17/2023 19:15   DG Chest 2 View  Result Date: 07/17/2023 CLINICAL DATA:  Shortness of breath. EXAM: CHEST - 2 VIEW COMPARISON:  07/14/2023 FINDINGS: Cardiopericardial silhouette is at upper limits of normal for size. Mild vascular congestion without edema. No focal consolidation or pneumothorax. Tiny bilateral pleural effusions evident. No acute bony abnormality. IMPRESSION: 1. Mild vascular congestion without edema. 2. Tiny bilateral pleural effusions. Electronically Signed   By: Kennith Center M.D.   On: 07/17/2023 11:30   DG Abd Portable 1V  Result Date: 07/15/2023 CLINICAL DATA:  NG tube placement EXAM: PORTABLE ABDOMEN - 1 VIEW COMPARISON:  CT 07/14/2023 FINDINGS: Esophageal tube tip in the distal stomach. Dilated bowel in the upper abdomen IMPRESSION: Esophageal tube tip in the distal stomach. Electronically Signed   By: Jasmine Pang M.D.   On: 07/15/2023 00:46   CT ABDOMEN PELVIS W CONTRAST  Result Date: 07/14/2023 CLINICAL DATA:  Bowel obstruction suspected. Nausea and vomiting. Right leg pain. EXAM: CT ABDOMEN AND PELVIS WITH CONTRAST TECHNIQUE: Multidetector CT imaging of the abdomen and pelvis was performed using the standard protocol following bolus administration of intravenous contrast. RADIATION DOSE REDUCTION: This  exam was performed according to the departmental dose-optimization program which includes automated exposure control, adjustment of the mA  and/or kV according to patient size and/or use of iterative reconstruction technique. CONTRAST:  OMNIPAQUE IOHEXOL 300 MG/ML  SOLN COMPARISON:  CT scan abdomen and pelvis from 02/11/2023. FINDINGS: Lower chest: The lung bases are clear. No pleural effusion. The heart is normal in size. No pericardial effusion. Hepatobiliary: The liver is normal in size. Non-cirrhotic configuration. No suspicious mass. These is mild diffuse hepatic steatosis. No intrahepatic or extrahepatic bile duct dilation. No calcified gallstones. Normal gallbladder wall thickness. No pericholecystic inflammatory changes. Pancreas: Unremarkable. No pancreatic ductal dilatation or surrounding inflammatory changes. Spleen: Within normal limits. No focal lesion. Adrenals/Urinary Tract: Adrenal glands are unremarkable. No suspicious renal mass. There are at least 2 subcentimeter sized simple cysts in the right kidney. No hydronephrosis. No renal or ureteric calculi. Unremarkable urinary bladder. Stomach/Bowel: There are multiple loops of small bowel which are dilated measuring up to 3.4 cm in diameter. The gradually taper in the right lower quadrant (series 6, image 33). No point of transition seen. Several distal ileal loops are nondilated. The colon contains moderate amount of stool burden without abnormal dilation. A normal appendix is not seen and may be surgically absent. No inflammatory changes in the right lower quadrant. No abnormal bowel wall thickening or surrounding fat stranding. Vascular/Lymphatic: No ascites or pneumoperitoneum. No abdominal or pelvic lymphadenopathy, by size criteria. No aneurysmal dilation of the major abdominal arteries. There are moderate peripheral atherosclerotic vascular calcifications of the aorta and its major branches. Reproductive: Normal size prostate. Symmetric  seminal vesicles. Other: There are bilateral small fat containing inguinal hernias. The soft tissues and abdominal wall are otherwise unremarkable. Musculoskeletal: No suspicious osseous lesions. There are mild multilevel degenerative changes in the visualized spine. IMPRESSION: *There are disproportionately dilated multiple small bowel loops which gradually tapers in the right lower quadrant without discrete point of transition. Findings favor adynamic ileus. Correlate clinically. Consider continued radiological follow-up to exclude developing small bowel obstruction. No suspicious mass or inflammatory changes noted. No pneumoperitoneum, ascites, pneumatosis or portal venous gas. *Multiple other nonacute observations, as described above. Electronically Signed   By: Jules Schick M.D.   On: 07/14/2023 13:38   CT Head Wo Contrast  Result Date: 07/14/2023 CLINICAL DATA:  Provided history: Transient ischemic attack (TIA). Nausea. Vomiting. EXAM: CT HEAD WITHOUT CONTRAST TECHNIQUE: Contiguous axial images were obtained from the base of the skull through the vertex without intravenous contrast. RADIATION DOSE REDUCTION: This exam was performed according to the departmental dose-optimization program which includes automated exposure control, adjustment of the mA and/or kV according to patient size and/or use of iterative reconstruction technique. COMPARISON:  Head CT 03/11/2023. FINDINGS: Brain: No age advanced or lobar predominant parenchymal atrophy. There is no acute intracranial hemorrhage. No demarcated cortical infarct. No extra-axial fluid collection. No evidence of an intracranial mass. No midline shift. Vascular: No hyperdense vessel.  Atherosclerotic calcifications. Skull: No calvarial fracture or aggressive osseous lesion. Sinuses/Orbits: No mass or acute finding within the imaged orbits. Minimal mucosal thickening within the bilateral ethmoid sinuses. Minimal mucosal thickening, and tiny mucous retention  cyst, within the left sphenoid sinus. IMPRESSION: 1. No evidence of an acute intracranial abnormality. 2. Minor paranasal sinus disease as described. Electronically Signed   By: Jackey Loge D.O.   On: 07/14/2023 12:35   DG Chest Port 1 View  Result Date: 07/14/2023 CLINICAL DATA:  Sepsis.  Nausea and vomiting.  Atrial fibrillation. EXAM: PORTABLE CHEST 1 VIEW COMPARISON:  05/28/2023 FINDINGS: Very low lung volumes limit evaluation. Both lungs are grossly  clear. Heart size is within normal limits. Old left clavicle fracture deformity again noted. IMPRESSION: Very low lung volumes limit evaluation.  No acute findings. Electronically Signed   By: Danae Orleans M.D.   On: 07/14/2023 08:40

## 2023-08-05 NOTE — Assessment & Plan Note (Signed)
Chemotherapy induced. Close monitor

## 2023-08-05 NOTE — Assessment & Plan Note (Addendum)
Bone marrow biopsy was reviewed. 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 Cycle 3 D15 Daratumumab was held due to cytopenia and being on anticoagulation.  S/p 4 cycles  Daratumumab and Velcade with revlimid 10mg  2 weeks on 1 week off  Cycle 4 D15 missed due to hospitalization due to ileus. BM biopsy at [facility] after 4 cycles showed 50-60% plasma cells.  Labs are reviewed and discussed with patient. Discussed with [facility]. Recommend additional 3-4 cycles.  Proceed with cycle 5 Dara Rvd, increase Revlimid to 15mg  2 weeks on 1 week off  Continue Acyclovir, At high risk of thrombosis, on Eliquis 5mg  BID for A fib Xegeva monthly- hold resuming due to hypocalcemia.  Recommend calcium and vitamin D supplementation.   Follow up with [facility] in the future evaluation for BM transplant - no amyloidosis on cardiac MRI

## 2023-08-07 LAB — KAPPA/LAMBDA LIGHT CHAINS
Kappa free light chain: 7.2 mg/L (ref 3.3–19.4)
Kappa, lambda light chain ratio: 0.09 — ABNORMAL LOW (ref 0.26–1.65)
Lambda free light chains: 78.6 mg/L — ABNORMAL HIGH (ref 5.7–26.3)

## 2023-08-08 ENCOUNTER — Ambulatory Visit: Payer: Medicare HMO

## 2023-08-08 ENCOUNTER — Inpatient Hospital Stay: Payer: Medicare HMO

## 2023-08-08 ENCOUNTER — Telehealth: Payer: Self-pay

## 2023-08-08 VITALS — BP 124/69 | HR 61 | Temp 96.7°F | Resp 18

## 2023-08-08 DIAGNOSIS — Z5112 Encounter for antineoplastic immunotherapy: Secondary | ICD-10-CM | POA: Diagnosis not present

## 2023-08-08 DIAGNOSIS — C9000 Multiple myeloma not having achieved remission: Secondary | ICD-10-CM

## 2023-08-08 MED ORDER — PROCHLORPERAZINE MALEATE 10 MG PO TABS
10.0000 mg | ORAL_TABLET | Freq: Once | ORAL | Status: AC
  Administered 2023-08-08: 10 mg via ORAL
  Filled 2023-08-08: qty 1

## 2023-08-08 MED ORDER — BORTEZOMIB CHEMO SQ INJECTION 3.5 MG (2.5MG/ML)
1.3000 mg/m2 | Freq: Once | INTRAMUSCULAR | Status: AC
Start: 2023-08-08 — End: 2023-08-08
  Administered 2023-08-08: 2.5 mg via SUBCUTANEOUS
  Filled 2023-08-08: qty 1

## 2023-08-08 NOTE — Telephone Encounter (Signed)
Message sent to infusion nurse to let her know that pt will not get Xgeva today.   Called pt and informed him to continue taking Ca 1200 mg daily

## 2023-08-08 NOTE — Telephone Encounter (Signed)
-----   Message from [name] sent at 08/05/2023  7:36 PM EST ----- Please cancel his Rivka Barbara as calcium is still low. Please remind him to continue take calcium 1200mg  daily. Thanks.

## 2023-08-08 NOTE — Patient Instructions (Signed)
[facility] - A DEPT OF [facility]  Discharge Instructions: Thank you for choosing [facility] to provide your oncology and hematology care.  If you have a lab appointment with the Cancer Center, please go directly to the Cancer Center and check in at the registration area.  Wear comfortable clothing and clothing appropriate for easy access to any Portacath or PICC line.   We strive to give you quality time with your provider. You may need to reschedule your appointment if you arrive late (15 or more minutes).  Arriving late affects you and other patients whose appointments are after yours.  Also, if you miss three or more appointments without notifying the office, you may be dismissed from the clinic at the provider's discretion.      For prescription refill requests, have your pharmacy contact our office and allow 72 hours for refills to be completed.    Today you received the following chemotherapy and/or immunotherapy agents VELCADE       To help prevent nausea and vomiting after your treatment, we encourage you to take your nausea medication as directed.  BELOW ARE SYMPTOMS THAT SHOULD BE REPORTED IMMEDIATELY: *FEVER GREATER THAN 100.4 F (38 C) OR HIGHER *CHILLS OR SWEATING *NAUSEA AND VOMITING THAT IS NOT CONTROLLED WITH YOUR NAUSEA MEDICATION *UNUSUAL SHORTNESS OF BREATH *UNUSUAL BRUISING OR BLEEDING *URINARY PROBLEMS (pain or burning when urinating, or frequent urination) *BOWEL PROBLEMS (unusual diarrhea, constipation, pain near the anus) TENDERNESS IN MOUTH AND THROAT WITH OR WITHOUT PRESENCE OF ULCERS (sore throat, sores in mouth, or a toothache) UNUSUAL RASH, SWELLING OR PAIN  UNUSUAL VAGINAL DISCHARGE OR ITCHING   Items with * indicate a potential emergency and should be followed up as soon as possible or go to the Emergency Department if any problems should occur.  Please show the CHEMOTHERAPY ALERT CARD or IMMUNOTHERAPY  ALERT CARD at check-in to the Emergency Department and triage nurse.  Should you have questions after your visit or need to cancel or reschedule your appointment, please contact [facility] - A DEPT OF [facility]  [phone] and follow the prompts.  Office hours are 8:00 a.m. to 4:30 p.m. Monday - Friday. Please note that voicemails left after 4:00 p.m. may not be returned until the following business day.  We are closed weekends and major holidays. You have access to a nurse at all times for urgent questions. Please call the main number to the clinic [phone] and follow the prompts.  For any non-urgent questions, you may also contact your provider using MyChart. We now offer e-Visits for anyone 56 and older to request care online for non-urgent symptoms. For details visit [email or web address].   Also download the MyChart app! Go to the app store, search "MyChart", open the app, select [place], and log in with your MyChart username and password.  Bortezomib Injection What is this medication? BORTEZOMIB (bor TEZ oh mib) treats lymphoma. It may also be used to treat multiple myeloma, a type of bone marrow cancer. It works by blocking a protein that causes cancer cells to grow and multiply. This helps to slow or stop the spread of cancer cells. This medicine may be used for other purposes; ask your health care provider or pharmacist if you have questions. COMMON BRAND NAME(S): Velcade What should I tell my care team before I take this medication? They need to know if you have any of these conditions: Dehydration Diabetes  Heart disease Liver disease Tingling of the fingers or toes or other nerve disorder An unusual or allergic reaction to bortezomib, other medications, foods, dyes, or preservatives If you or your partner are pregnant or trying to get pregnant Breastfeeding How should I use this medication? This medication is injected into a vein or  under the skin. It is given by your care team in a hospital or clinic setting. Talk to your care team about the use of this medication in children. Special care may be needed. Overdosage: If you think you have taken too much of this medicine contact a poison control center or emergency room at once. NOTE: This medicine is only for you. Do not share this medicine with others. What if I miss a dose? Keep appointments for follow-up doses. It is important not to miss your dose. Call your care team if you are unable to keep an appointment. What may interact with this medication? Ketoconazole Rifampin This list may not describe all possible interactions. Give your health care provider a list of all the medicines, herbs, non-prescription drugs, or dietary supplements you use. Also tell them if you smoke, drink alcohol, or use illegal drugs. Some items may interact with your medicine. What should I watch for while using this medication? Your condition will be monitored carefully while you are receiving this medication. You may need blood work while taking this medication. This medication may affect your coordination, reaction time, or judgment. Do not drive or operate machinery until you know how this medication affects you. Sit up or stand slowly to reduce the risk of dizzy or fainting spells. Drinking alcohol with this medication can increase the risk of these side effects. This medication may increase your risk of getting an infection. Call your care team for advice if you get a fever, chills, sore throat, or other symptoms of a cold or flu. Do not treat yourself. Try to avoid being around people who are sick. Check with your care team if you have severe diarrhea, nausea, and vomiting, or if you sweat a lot. The loss of too much body fluid may make it dangerous for you to take this medication. Talk to your care team if you may be pregnant. Serious birth defects can occur if you take this medication during  pregnancy and for 7 months after the last dose. You will need a negative pregnancy test before starting this medication. Contraception is recommended while taking this medication and for 7 months after the last dose. Your care team can help you find the option that works for you. If your partner can get pregnant, use a condom during sex while taking this medication and for 4 months after the last dose. Do not breastfeed while taking this medication and for 2 months after the last dose. This medication may cause infertility. Talk to your care team if you are concerned about your fertility. What side effects may I notice from receiving this medication? Side effects that you should report to your care team as soon as possible: Allergic reactions--skin rash, itching, hives, swelling of the face, lips, tongue, or throat Bleeding--bloody or black, tar-like stools, vomiting blood or brown material that looks like coffee grounds, red or dark brown urine, small red or purple spots on skin, unusual bruising or bleeding Bleeding in the brain--severe headache, stiff neck, confusion, dizziness, change in vision, numbness or weakness of the face, arm, or leg, trouble speaking, trouble walking, vomiting Bowel blockage--stomach cramping, unable to have a bowel movement  or pass gas, loss of appetite, vomiting Heart failure--shortness of breath, swelling of the ankles, feet, or hands, sudden weight gain, unusual weakness or fatigue Infection--fever, chills, cough, sore throat, wounds that don't heal, pain or trouble when passing urine, general feeling of discomfort or being unwell Liver injury--right upper belly pain, loss of appetite, nausea, light-colored stool, dark yellow or brown urine, yellowing skin or eyes, unusual weakness or fatigue Low blood pressure--dizziness, feeling faint or lightheaded, blurry vision Lung injury--shortness of breath or trouble breathing, cough, spitting up blood, chest pain, fever Pain,  tingling, or numbness in the hands or feet Severe or prolonged diarrhea Stomach pain, bloody diarrhea, pale skin, unusual weakness or fatigue, decrease in the amount of urine, which may be signs of hemolytic uremic syndrome Sudden and severe headache, confusion, change in vision, seizures, which may be signs of posterior reversible encephalopathy syndrome (PRES) TTP--purple spots on the skin or inside the mouth, pale skin, yellowing skin or eyes, unusual weakness or fatigue, fever, fast or irregular heartbeat, confusion, change in vision, trouble speaking, trouble walking Tumor lysis syndrome (TLS)--nausea, vomiting, diarrhea, decrease in the amount of urine, dark urine, unusual weakness or fatigue, confusion, muscle pain or cramps, fast or irregular heartbeat, joint pain Side effects that usually do not require medical attention (report to your care team if they continue or are bothersome): Constipation Diarrhea Fatigue Loss of appetite Nausea This list may not describe all possible side effects. Call your doctor for medical advice about side effects. You may report side effects to FDA at 1-800-FDA-1088. Where should I keep my medication? This medication is given in a hospital or clinic. It will not be stored at home. NOTE: This sheet is a summary. It may not cover all possible information. If you have questions about this medicine, talk to your doctor, pharmacist, or health care provider.  2024 [employer]/Gold Standard (2022-02-02 00:00:00)

## 2023-08-09 ENCOUNTER — Telehealth: Payer: Self-pay | Admitting: *Deleted

## 2023-08-09 ENCOUNTER — Other Ambulatory Visit: Payer: Self-pay | Admitting: *Deleted

## 2023-08-09 ENCOUNTER — Encounter: Payer: Self-pay | Admitting: Oncology

## 2023-08-09 DIAGNOSIS — C9000 Multiple myeloma not having achieved remission: Secondary | ICD-10-CM

## 2023-08-09 DIAGNOSIS — R42 Dizziness and giddiness: Secondary | ICD-10-CM

## 2023-08-09 NOTE — Telephone Encounter (Signed)
Patient called reporting that the Lenalidomide 15 mg is making him dizzy all the time.Please advise of any orders or changes

## 2023-08-09 NOTE — Telephone Encounter (Signed)
Pt will be seen by [facility] on 11/27.

## 2023-08-10 ENCOUNTER — Inpatient Hospital Stay: Payer: Medicare HMO

## 2023-08-10 ENCOUNTER — Other Ambulatory Visit: Payer: Self-pay | Admitting: Pharmacist

## 2023-08-10 ENCOUNTER — Inpatient Hospital Stay (HOSPITAL_BASED_OUTPATIENT_CLINIC_OR_DEPARTMENT_OTHER): Payer: Medicare HMO | Admitting: Hospice and Palliative Medicine

## 2023-08-10 ENCOUNTER — Ambulatory Visit: Payer: Medicare HMO

## 2023-08-10 VITALS — BP 134/74 | HR 73 | Temp 97.9°F | Resp 18 | Ht 67.0 in | Wt 167.0 lb

## 2023-08-10 DIAGNOSIS — C9000 Multiple myeloma not having achieved remission: Secondary | ICD-10-CM

## 2023-08-10 DIAGNOSIS — R42 Dizziness and giddiness: Secondary | ICD-10-CM

## 2023-08-10 DIAGNOSIS — Z5112 Encounter for antineoplastic immunotherapy: Secondary | ICD-10-CM | POA: Diagnosis not present

## 2023-08-10 LAB — CBC WITH DIFFERENTIAL (CANCER CENTER ONLY)
Abs Immature Granulocytes: 0.1 10*3/uL — ABNORMAL HIGH (ref 0.00–0.07)
Basophils Absolute: 0 10*3/uL (ref 0.0–0.1)
Basophils Relative: 0 %
Eosinophils Absolute: 0.2 10*3/uL (ref 0.0–0.5)
Eosinophils Relative: 2 %
HCT: 36.6 % — ABNORMAL LOW (ref 39.0–52.0)
Hemoglobin: 12.5 g/dL — ABNORMAL LOW (ref 13.0–17.0)
Immature Granulocytes: 1 %
Lymphocytes Relative: 4 %
Lymphs Abs: 0.5 10*3/uL — ABNORMAL LOW (ref 0.7–4.0)
MCH: 33.6 pg (ref 26.0–34.0)
MCHC: 34.2 g/dL (ref 30.0–36.0)
MCV: 98.4 fL (ref 80.0–100.0)
Monocytes Absolute: 0.8 10*3/uL (ref 0.1–1.0)
Monocytes Relative: 6 %
Neutro Abs: 12 10*3/uL — ABNORMAL HIGH (ref 1.7–7.7)
Neutrophils Relative %: 87 %
Platelet Count: 142 10*3/uL — ABNORMAL LOW (ref 150–400)
RBC: 3.72 MIL/uL — ABNORMAL LOW (ref 4.22–5.81)
RDW: 15.3 % (ref 11.5–15.5)
WBC Count: 13.6 10*3/uL — ABNORMAL HIGH (ref 4.0–10.5)
nRBC: 0 % (ref 0.0–0.2)

## 2023-08-10 LAB — CMP (CANCER CENTER ONLY)
ALT: 31 U/L (ref 0–44)
AST: 16 U/L (ref 15–41)
Albumin: 3.8 g/dL (ref 3.5–5.0)
Alkaline Phosphatase: 105 U/L (ref 38–126)
Anion gap: 9 (ref 5–15)
BUN: 27 mg/dL — ABNORMAL HIGH (ref 8–23)
CO2: 23 mmol/L (ref 22–32)
Calcium: 8.4 mg/dL — ABNORMAL LOW (ref 8.9–10.3)
Chloride: 99 mmol/L (ref 98–111)
Creatinine: 1.02 mg/dL (ref 0.61–1.24)
GFR, Estimated: >60 mL/min (ref >60)
Glucose, Bld: 263 mg/dL — ABNORMAL HIGH (ref 70–99)
Potassium: 4.5 mmol/L (ref 3.5–5.1)
Sodium: 131 mmol/L — ABNORMAL LOW (ref 135–145)
Total Bilirubin: 0.7 mg/dL (ref <1.2)
Total Protein: 7.8 g/dL (ref 6.5–8.1)

## 2023-08-10 MED ORDER — LENALIDOMIDE 10 MG PO CAPS: 10.0000 mg | ORAL_CAPSULE | Freq: Every day | ORAL | 0 refills | Status: DC

## 2023-08-10 NOTE — Progress Notes (Signed)
Symptom Management Clinic [facility] at [facility] Telephone:([phone]) [phone] Fax:([phone]) [phone]  Patient Care Team: Danella Penton, MD as PCP - General (Internal Medicine) Debbe Odea, MD as PCP - Cardiology (Cardiology) [name], MD as Consulting Physician (Oncology)   NAME OF PATIENT: Victor Phillips  [id number]  [birth year]   DATE OF VISIT: 08/10/23  REASON FOR CONSULT: OLUWAJOMILOJU Phillips is a 67 y.o. male with multiple medical problems including stage III multiple myeloma on treatment with Dara RVD.   INTERVAL HISTORY: Patient saw Dr. Cathie Hoops on 08/05/2023 for cycle 5 Dara RVD.  Dose of Revlimid was increased to 15 mg 2 weeks on and 1 week off.  Patient was referred to [facility] oncology for consideration of bone marrow transplant.  Patient presents [facility] today for evaluation of dizziness.  Patient says that he took 1 dose of Revlimid 15 mg and it led to significant dizziness.  Patient felt like the room was spinning no matter if he were standing or sitting or lying flat.  He has subsequently held the Revlimid x 2 days and says that his symptoms are improving.  Denies headaches or visual changes.  No hearing changes.  Denies recent fevers or illnesses. Denies any easy bleeding or bruising. Reports fair appetite and denies weight loss. Denies chest pain. Denies any nausea, vomiting, constipation, or diarrhea. Denies urinary complaints. Patient offers no further specific complaints today.   PAST MEDICAL HISTORY: Past Medical History:  Diagnosis Date   Atrial fibrillation (HCC)    Diabetes mellitus without complication (HCC)    Hypercholesteremia    Hypertension    Multiple myeloma not having achieved remission (HCC)     PAST SURGICAL HISTORY:  Past Surgical History:  Procedure Laterality Date   COLONOSCOPY N/A 02/13/2023   Procedure: COLONOSCOPY;  Surgeon: Toledo, Boykin Nearing, MD;  Location: [facility] ENDOSCOPY;  Service: Gastroenterology;  Laterality: N/A;    COLONOSCOPY N/A 02/14/2023   Procedure: COLONOSCOPY;  Surgeon: Toledo, Boykin Nearing, MD;  Location: [facility] ENDOSCOPY;  Service: Gastroenterology;  Laterality: N/A;   ESOPHAGOGASTRODUODENOSCOPY N/A 02/13/2023   Procedure: ESOPHAGOGASTRODUODENOSCOPY (EGD);  Surgeon: Toledo, Boykin Nearing, MD;  Location: [facility] ENDOSCOPY;  Service: Gastroenterology;  Laterality: N/A;    HEMATOLOGY/ONCOLOGY HISTORY:  Oncology History  Multiple myeloma (HCC)  04/14/2023 Initial Diagnosis   Multiple myeloma   03/13/2020 multiple myeloma panel showed M protein 4.3, IgA lambda Free lamda Level 142,-light chain ratio 0.07 04/05/2023 bone marrow biopsy showed hypercellular bone marrow with plasma cell neoplasm.The bone marrow is hypercellular for age with prominent increase in plasma cells representing 65% of all cells in the aspirate associated with interstitial infiltrates and numerous variably sized aggregates in  the clot and biopsy sections.  The plasma cells display lambda light chain restriction consistent with plasma cell neoplasm.  Normal cytogenetics.  FISH studies pending.    04/14/2023 Cancer Staging   Staging form: Plasma Cell Myeloma and Plasma Cell Disorders, AJCC 8th Edition - Clinical stage from 04/14/2023: RISS Stage II (Beta-2-microglobulin (mg/L): 8.4, Albumin (g/dL): 2.6, ISS: Stage III, High-risk cytogenetics: Absent, LDH: Normal) - Signed by [name], MD on 04/22/2023 Stage prefix: Initial diagnosis Beta 2 microglobulin range (mg/L): Greater than or equal to 5.5 Albumin range (g/dL): Less than 3.5 Cytogenetics: 1q addition, Other mutation   04/14/2023 Imaging   Skeletal survey 1. No lytic lesions or other intrinsic bony abnormality. 2. Borderline cardiomegaly with an interval decrease in size. 3. Diffuse atheromatous arterial calcifications including bilateral carotid artery calcifications, right greater than  left   04/22/2023 -  Chemotherapy   Patient is on Treatment Plan : MYELOMA NEWLY DIAGNOSED TRANSPLANT  CANDIDATE DaraVRd (Daratumumab SQ) q21d     04/25/2023 Imaging   PET scan showed  1. Two tiny hypermetabolic lucent lesions in the left posteromedial eighth and ninth ribs. No additional evidence of multiple myeloma. 2. Aortic atherosclerosis (ICD10-I70.0). Coronary artery calcification.   07/14/2023 - 07/19/2023 Hospital Admission   Hospitalized due to ileus.    07/25/2023 Bone Marrow Biopsy   A-C. Peripheral blood and bone marrow, (peripheral smear, aspirate smear, touch preparation, clot section, core biopsy):   Persistent plasma cell myeloma, 50-60% lambda restricted plasma cells on core biopsy. Hypercellular bone marrow (70%) with trilineage hematopoiesis. Negative for increased blasts. Negative for significant dysplasia. Negative for amyloid deposition.     ALLERGIES:  has No Known Allergies.  MEDICATIONS:  Current Outpatient Medications  Medication Sig Dispense Refill   apixaban (ELIQUIS) 5 MG TABS tablet Take 1 tablet (5 mg total) by mouth 2 (two) times daily. 180 tablet 0   atorvastatin (LIPITOR) 10 MG tablet Take 1 tablet by mouth daily.     calcium carbonate (OS-CAL) 600 MG TABS tablet Take 2 tablets (1,200 mg total) by mouth daily.     cholecalciferol (VITAMIN D3) 25 MCG (1000 UNIT) tablet Take 1 tablet (1,000 Units total) by mouth daily.     glipiZIDE (GLUCOTROL XL) 2.5 MG 24 hr tablet Take by mouth.     lenalidomide (REVLIMID) 15 MG capsule Take 1 capsule (15 mg total) by mouth daily. Take for 14 days, then hold for 7 days. Repeat every 21 days. 14 capsule 0   metoprolol succinate (TOPROL-XL) 50 MG 24 hr tablet Take 1 tablet (50 mg total) by mouth daily. Take with or immediately following a meal. 90 tablet 3   Multiple Vitamin (MULTIVITAMIN WITH MINERALS) TABS tablet Take 1 tablet by mouth daily. 90 tablet 1   temazepam (RESTORIL) 15 MG capsule Take by mouth.     acetaminophen (TYLENOL) 325 MG tablet Take 2 tablets (650 mg total) by mouth every 6 (six) hours as needed  for mild pain (or Fever >/= 101). (Patient not taking: Reported on 08/05/2023) 90 tablet 1   acyclovir (ZOVIRAX) 400 MG tablet Take 1 tablet (400 mg total) by mouth 2 (two) times daily. (Patient not taking: Reported on 08/10/2023) 60 tablet 5   furosemide (LASIX) 20 MG tablet Take 1 tablet (20 mg total) by mouth daily as needed for edema or fluid. (Patient not taking: Reported on 08/10/2023) 30 tablet 0   ondansetron (ZOFRAN) 8 MG tablet Take 1 tablet (8 mg total) by mouth every 8 (eight) hours as needed for nausea or vomiting. (Patient not taking: Reported on 08/10/2023) 30 tablet 1   pantoprazole (PROTONIX) 40 MG tablet Take 1 tablet (40 mg total) by mouth daily. 30 tablet 1   polyethylene glycol (MIRALAX) 17 g packet Take 17 g by mouth daily. (Patient not taking: Reported on 08/10/2023) 14 each 0   prochlorperazine (COMPAZINE) 10 MG tablet Take 1 tablet (10 mg total) by mouth every 6 (six) hours as needed for nausea or vomiting. (Patient not taking: Reported on 08/10/2023) 30 tablet 1   senna (SENOKOT) 8.6 MG TABS tablet Take 2 tablets (17.2 mg total) by mouth daily. (Patient not taking: Reported on 08/10/2023) 120 tablet 0   No current facility-administered medications for this visit.    VITAL SIGNS: BP 134/74 (BP Location: Left Arm, Patient Position: Standing, Cuff Size: Normal)  Pulse 73   Temp 97.9 F (36.6 C) (Tympanic)   Resp 18   Ht 5\' 7"  (1.702 m)   Wt 167 lb (75.8 kg)   BMI 26.16 kg/m  Filed Weights   08/10/23 1254  Weight: 167 lb (75.8 kg)    Estimated body mass index is 26.16 kg/m as calculated from the following:   Height as of this encounter: 5\' 7"  (1.702 m).   Weight as of this encounter: 167 lb (75.8 kg).  LABS: CBC:    Component Value Date/Time   WBC 13.6 (H) 08/10/2023 1228   WBC 4.8 07/19/2023 0305   HGB 12.5 (L) 08/10/2023 1228   HCT 36.6 (L) 08/10/2023 1228   PLT 142 (L) 08/10/2023 1228   MCV 98.4 08/10/2023 1228   NEUTROABS 12.0 (H) 08/10/2023 1228    LYMPHSABS 0.5 (L) 08/10/2023 1228   MONOABS 0.8 08/10/2023 1228   EOSABS 0.2 08/10/2023 1228   BASOSABS 0.0 08/10/2023 1228   Comprehensive Metabolic Panel:    Component Value Date/Time   NA 131 (L) 08/10/2023 1228   K 4.5 08/10/2023 1228   CL 99 08/10/2023 1228   CO2 23 08/10/2023 1228   BUN 27 (H) 08/10/2023 1228   CREATININE 1.02 08/10/2023 1228   GLUCOSE 263 (H) 08/10/2023 1228   CALCIUM 8.4 (L) 08/10/2023 1228   AST 16 08/10/2023 1228   ALT 31 08/10/2023 1228   ALKPHOS 105 08/10/2023 1228   BILITOT 0.7 08/10/2023 1228   PROT 7.8 08/10/2023 1228   ALBUMIN 3.8 08/10/2023 1228    RADIOGRAPHIC STUDIES: MR CARDIAC MORPHOLOGY W WO CONTRAST  Result Date: 07/27/2023 CLINICAL DATA:  Hx of multiple myeloma.  Evaluate for amyloidosis EXAM: CARDIAC MRI TECHNIQUE: The patient was scanned on a 1.5 Tesla Siemens magnet. A dedicated cardiac coil was used. Functional imaging was done using Fiesta sequences. 2,3, and 4 chamber views were done to assess for RWMA's. Modified Simpson's rule using a short axis stack was used to calculate an ejection fraction on a dedicated work [employer]. The patient received 11 cc of Gadavist. After 10 minutes inversion recovery sequences were used to assess for infiltration and scar tissue. Velocity flow mapping performed in the ascending aorta and main pulmonary artery. CONTRAST:  11 cc  of Gadavist FINDINGS: 1. Normal left ventricular size, thickness and systolic function (LVEF = 50%). There are no regional wall motion abnormalities. There is no late gadolinium enhancement in the left ventricular myocardium. LVEDV: 133 ml LVESV: 67 ml SV: 66 ml CO: 6.1 L/min Myocardial mass: 118 g LV native T1 value 1092 ms (normal < 1000 ms). LV ECV 27.4%. (Normal <30%). 2. Normal right ventricular size, thickness and systolic function (RVEF = 42%). There are no regional wall motion abnormalities. 3.  Mildly dilated left atrial size.  Normal right atrial size. 4.  Normal size of the aortic root, ascending aorta and pulmonary artery. 5.  No significant valvular abnormalities. 6.  Normal pericardium.  No pericardial effusion. IMPRESSION: 1.  Normal LV size, low normal systolic function.  LVEF 50% 2.  No LV LGE or scar. 3.  Normal ECV, no evidence for infiltrative disease. 4.  No significant valvular abnormalities. 5.  Normal RV systolic function. 6.  No evidence for cardiac amyloidosis. Electronically Signed   By: Debbe Odea M.D.   On: 07/27/2023 17:55   MR CARDIAC VELOCITY FLOW MAP  Result Date: 07/27/2023 CLINICAL DATA:  Hx of multiple myeloma.  Evaluate for amyloidosis EXAM: CARDIAC MRI TECHNIQUE:  The patient was scanned on a 1.5 Tesla Siemens magnet. A dedicated cardiac coil was used. Functional imaging was done using Fiesta sequences. 2,3, and 4 chamber views were done to assess for RWMA's. Modified Simpson's rule using a short axis stack was used to calculate an ejection fraction on a dedicated work [employer], [employer]. The patient received 11 cc of Gadavist. After 10 minutes inversion recovery sequences were used to assess for infiltration and scar tissue. Velocity flow mapping performed in the ascending aorta and main pulmonary artery. CONTRAST:  11 cc  of Gadavist FINDINGS: 1. Normal left ventricular size, thickness and systolic function (LVEF = 50%). There are no regional wall motion abnormalities. There is no late gadolinium enhancement in the left ventricular myocardium. LVEDV: 133 ml LVESV: 67 ml SV: 66 ml CO: 6.1 L/min Myocardial mass: 118 g LV native T1 value 1092 ms (normal < 1000 ms). LV ECV 27.4%. (Normal <30%). 2. Normal right ventricular size, thickness and systolic function (RVEF = 42%). There are no regional wall motion abnormalities. 3.  Mildly dilated left atrial size.  Normal right atrial size. 4. Normal size of the aortic root, ascending aorta and pulmonary artery. 5.  No significant valvular abnormalities. 6.  Normal  pericardium.  No pericardial effusion. IMPRESSION: 1.  Normal LV size, low normal systolic function.  LVEF 50% 2.  No LV LGE or scar. 3.  Normal ECV, no evidence for infiltrative disease. 4.  No significant valvular abnormalities. 5.  Normal RV systolic function. 6.  No evidence for cardiac amyloidosis. Electronically Signed   By: Debbe Odea M.D.   On: 07/27/2023 17:55   MR CARDIAC VELOCITY FLOW MAP  Result Date: 07/27/2023 CLINICAL DATA:  Hx of multiple myeloma.  Evaluate for amyloidosis EXAM: CARDIAC MRI TECHNIQUE: The patient was scanned on a 1.5 Tesla Siemens magnet. A dedicated cardiac coil was used. Functional imaging was done using Fiesta sequences. 2,3, and 4 chamber views were done to assess for RWMA's. Modified Simpson's rule using a short axis stack was used to calculate an ejection fraction on a dedicated work [employer]. The patient received 11 cc of Gadavist. After 10 minutes inversion recovery sequences were used to assess for infiltration and scar tissue. Velocity flow mapping performed in the ascending aorta and main pulmonary artery. CONTRAST:  11 cc  of Gadavist FINDINGS: 1. Normal left ventricular size, thickness and systolic function (LVEF = 50%). There are no regional wall motion abnormalities. There is no late gadolinium enhancement in the left ventricular myocardium. LVEDV: 133 ml LVESV: 67 ml SV: 66 ml CO: 6.1 L/min Myocardial mass: 118 g LV native T1 value 1092 ms (normal < 1000 ms). LV ECV 27.4%. (Normal <30%). 2. Normal right ventricular size, thickness and systolic function (RVEF = 42%). There are no regional wall motion abnormalities. 3.  Mildly dilated left atrial size.  Normal right atrial size. 4. Normal size of the aortic root, ascending aorta and pulmonary artery. 5.  No significant valvular abnormalities. 6.  Normal pericardium.  No pericardial effusion. IMPRESSION: 1.  Normal LV size, low normal systolic function.  LVEF 50% 2.  No LV LGE or scar.  3.  Normal ECV, no evidence for infiltrative disease. 4.  No significant valvular abnormalities. 5.  Normal RV systolic function. 6.  No evidence for cardiac amyloidosis. Electronically Signed   By: Debbe Odea M.D.   On: 07/27/2023 17:55   DG Abd 1 View  Result Date: 07/17/2023 CLINICAL DATA:  Distension  EXAM: ABDOMEN - 1 VIEW COMPARISON:  07/14/2023, CT 07/14/2023 FINDINGS: Removal of esophageal tube.persistent gaseous dilatation of right abdominal small bowel loops measuring up to 3.5 cm with mild diffuse air distension of the colon. No radiopaque calculi. IMPRESSION: Persistent gaseous dilatation of right abdominal small bowel loops with mild diffuse air distension of the colon. Findings could be secondary to ileus or partial obstruction. Electronically Signed   By: Jasmine Pang M.D.   On: 07/17/2023 19:15   DG Chest 2 View  Result Date: 07/17/2023 CLINICAL DATA:  Shortness of breath. EXAM: CHEST - 2 VIEW COMPARISON:  07/14/2023 FINDINGS: Cardiopericardial silhouette is at upper limits of normal for size. Mild vascular congestion without edema. No focal consolidation or pneumothorax. Tiny bilateral pleural effusions evident. No acute bony abnormality. IMPRESSION: 1. Mild vascular congestion without edema. 2. Tiny bilateral pleural effusions. Electronically Signed   By: Kennith Center M.D.   On: 07/17/2023 11:30   DG Abd Portable 1V  Result Date: 07/15/2023 CLINICAL DATA:  NG tube placement EXAM: PORTABLE ABDOMEN - 1 VIEW COMPARISON:  CT 07/14/2023 FINDINGS: Esophageal tube tip in the distal stomach. Dilated bowel in the upper abdomen IMPRESSION: Esophageal tube tip in the distal stomach. Electronically Signed   By: Jasmine Pang M.D.   On: 07/15/2023 00:46   CT ABDOMEN PELVIS W CONTRAST  Result Date: 07/14/2023 CLINICAL DATA:  Bowel obstruction suspected. Nausea and vomiting. Right leg pain. EXAM: CT ABDOMEN AND PELVIS WITH CONTRAST TECHNIQUE: Multidetector CT imaging of the abdomen and  pelvis was performed using the standard protocol following bolus administration of intravenous contrast. RADIATION DOSE REDUCTION: This exam was performed according to the departmental dose-optimization program which includes automated exposure control, adjustment of the mA and/or kV according to patient size and/or use of iterative reconstruction technique. CONTRAST:  OMNIPAQUE IOHEXOL 300 MG/ML  SOLN COMPARISON:  CT scan abdomen and pelvis from 02/11/2023. FINDINGS: Lower chest: The lung bases are clear. No pleural effusion. The heart is normal in size. No pericardial effusion. Hepatobiliary: The liver is normal in size. Non-cirrhotic configuration. No suspicious mass. These is mild diffuse hepatic steatosis. No intrahepatic or extrahepatic bile duct dilation. No calcified gallstones. Normal gallbladder wall thickness. No pericholecystic inflammatory changes. Pancreas: Unremarkable. No pancreatic ductal dilatation or surrounding inflammatory changes. Spleen: Within normal limits. No focal lesion. Adrenals/Urinary Tract: Adrenal glands are unremarkable. No suspicious renal mass. There are at least 2 subcentimeter sized simple cysts in the right kidney. No hydronephrosis. No renal or ureteric calculi. Unremarkable urinary bladder. Stomach/Bowel: There are multiple loops of small bowel which are dilated measuring up to 3.4 cm in diameter. The gradually taper in the right lower quadrant (series 6, image 33). No point of transition seen. Several distal ileal loops are nondilated. The colon contains moderate amount of stool burden without abnormal dilation. A normal appendix is not seen and may be surgically absent. No inflammatory changes in the right lower quadrant. No abnormal bowel wall thickening or surrounding fat stranding. Vascular/Lymphatic: No ascites or pneumoperitoneum. No abdominal or pelvic lymphadenopathy, by size criteria. No aneurysmal dilation of the major abdominal arteries. There are moderate  peripheral atherosclerotic vascular calcifications of the aorta and its major branches. Reproductive: Normal size prostate. Symmetric seminal vesicles. Other: There are bilateral small fat containing inguinal hernias. The soft tissues and abdominal wall are otherwise unremarkable. Musculoskeletal: No suspicious osseous lesions. There are mild multilevel degenerative changes in the visualized spine. IMPRESSION: *There are disproportionately dilated multiple small bowel loops which gradually tapers  in the right lower quadrant without discrete point of transition. Findings favor adynamic ileus. Correlate clinically. Consider continued radiological follow-up to exclude developing small bowel obstruction. No suspicious mass or inflammatory changes noted. No pneumoperitoneum, ascites, pneumatosis or portal venous gas. *Multiple other nonacute observations, as described above. Electronically Signed   By: Jules Schick M.D.   On: 07/14/2023 13:38   CT Head Wo Contrast  Result Date: 07/14/2023 CLINICAL DATA:  Provided history: Transient ischemic attack (TIA). Nausea. Vomiting. EXAM: CT HEAD WITHOUT CONTRAST TECHNIQUE: Contiguous axial images were obtained from the base of the skull through the vertex without intravenous contrast. RADIATION DOSE REDUCTION: This exam was performed according to the departmental dose-optimization program which includes automated exposure control, adjustment of the mA and/or kV according to patient size and/or use of iterative reconstruction technique. COMPARISON:  Head CT 03/11/2023. FINDINGS: Brain: No age advanced or lobar predominant parenchymal atrophy. There is no acute intracranial hemorrhage. No demarcated cortical infarct. No extra-axial fluid collection. No evidence of an intracranial mass. No midline shift. Vascular: No hyperdense vessel.  Atherosclerotic calcifications. Skull: No calvarial fracture or aggressive osseous lesion. Sinuses/Orbits: No mass or acute finding within the  imaged orbits. Minimal mucosal thickening within the bilateral ethmoid sinuses. Minimal mucosal thickening, and tiny mucous retention cyst, within the left sphenoid sinus. IMPRESSION: 1. No evidence of an acute intracranial abnormality. 2. Minor paranasal sinus disease as described. Electronically Signed   By: Jackey Loge D.O.   On: 07/14/2023 12:35   DG Chest Port 1 View  Result Date: 07/14/2023 CLINICAL DATA:  Sepsis.  Nausea and vomiting.  Atrial fibrillation. EXAM: PORTABLE CHEST 1 VIEW COMPARISON:  05/28/2023 FINDINGS: Very low lung volumes limit evaluation. Both lungs are grossly clear. Heart size is within normal limits. Old left clavicle fracture deformity again noted. IMPRESSION: Very low lung volumes limit evaluation.  No acute findings. Electronically Signed   By: Danae Orleans M.D.   On: 07/14/2023 08:40    PERFORMANCE STATUS (ECOG) : 2 - Symptomatic, <50% confined to bed  Review of Systems Unless otherwise noted, a complete review of systems is negative.  Physical Exam General: NAD Cardiovascular: regular rate and rhythm Pulmonary: clear ant fields Abdomen: soft, nontender, + bowel sounds GU: no suprapubic tenderness Extremities: no edema, no joint deformities Skin: no rashes Neurological: Generalized weakness but otherwise nonfocal  IMPRESSION/PLAN: Multiple myeloma -now with dizziness following dose increase of Revlimid 15 mg.  Dizziness is improving after patient has held Revlimid x 2 days.  Revlimid is associated with 20% rate of dizziness but patient was tolerating Revlimid 10 mg dose without any adverse effects.  Discussed with Dr. Cathie Hoops who advised to restart Revlimid 10 mg, which she will order through pharmacy.  Dosing instructions provided the patient and wife.  Patient will follow-up next week with Dr. Cathie Hoops  Patient expressed understanding and was in agreement with this plan. He also understands that He can call clinic at any time with any questions, concerns, or  complaints.   Thank you for allowing me to participate in the care of this very pleasant patient.   Time Total: 15 minutes  Visit consisted of counseling and education dealing with the complex and emotionally intense issues of symptom management in the setting of serious illness.Greater than 50%  of this time was spent counseling and coordinating care related to the above assessment and plan.  Signed by: Laurette Schimke, PhD, NP-C

## 2023-08-10 NOTE — Progress Notes (Signed)
MD wanting to dose decrease pt to lenalidomide 10mg  and only fill for one week suply to keep patient on track with in office treatment. Rx Send to [facility] and patient aware of of Rx sent in and knows it is a short fill. Asked he call [facility] Friday to check on delivery.

## 2023-08-14 LAB — MULTIPLE MYELOMA PANEL, SERUM
Albumin SerPl Elph-Mcnc: 3.1 g/dL (ref 2.9–4.4)
Albumin/Glob SerPl: 0.9 (ref 0.7–1.7)
Alpha 1: 0.3 g/dL (ref 0.0–0.4)
Alpha2 Glob SerPl Elph-Mcnc: 0.9 g/dL (ref 0.4–1.0)
B-Globulin SerPl Elph-Mcnc: 0.9 g/dL (ref 0.7–1.3)
Gamma Glob SerPl Elph-Mcnc: 1.7 g/dL (ref 0.4–1.8)
Globulin, Total: 3.7 g/dL (ref 2.2–3.9)
IgA: 2212 mg/dL — ABNORMAL HIGH (ref 61–437)
IgG (Immunoglobin G), Serum: 248 mg/dL — ABNORMAL LOW (ref 603–1613)
IgM (Immunoglobulin M), Srm: 7 mg/dL — ABNORMAL LOW (ref 20–172)
M Protein SerPl Elph-Mcnc: 1.3 g/dL — ABNORMAL HIGH
Total Protein ELP: 6.8 g/dL (ref 6.0–8.5)

## 2023-08-15 ENCOUNTER — Inpatient Hospital Stay: Payer: Medicare HMO | Attending: Oncology

## 2023-08-15 ENCOUNTER — Inpatient Hospital Stay (HOSPITAL_BASED_OUTPATIENT_CLINIC_OR_DEPARTMENT_OTHER): Payer: Medicare HMO | Admitting: Oncology

## 2023-08-15 ENCOUNTER — Ambulatory Visit: Payer: Medicare HMO

## 2023-08-15 ENCOUNTER — Other Ambulatory Visit: Payer: Medicare HMO

## 2023-08-15 ENCOUNTER — Inpatient Hospital Stay: Payer: Medicare HMO

## 2023-08-15 ENCOUNTER — Encounter: Payer: Self-pay | Admitting: Oncology

## 2023-08-15 VITALS — BP 134/77 | HR 78 | Temp 97.2°F | Resp 18 | Wt 169.1 lb

## 2023-08-15 DIAGNOSIS — Z5112 Encounter for antineoplastic immunotherapy: Secondary | ICD-10-CM | POA: Insufficient documentation

## 2023-08-15 DIAGNOSIS — C9000 Multiple myeloma not having achieved remission: Secondary | ICD-10-CM | POA: Insufficient documentation

## 2023-08-15 DIAGNOSIS — Z5111 Encounter for antineoplastic chemotherapy: Secondary | ICD-10-CM

## 2023-08-15 DIAGNOSIS — Z79899 Other long term (current) drug therapy: Secondary | ICD-10-CM | POA: Insufficient documentation

## 2023-08-15 DIAGNOSIS — N1832 Chronic kidney disease, stage 3b: Secondary | ICD-10-CM

## 2023-08-15 LAB — CBC WITH DIFFERENTIAL (CANCER CENTER ONLY)
Abs Immature Granulocytes: 0.12 10*3/uL — ABNORMAL HIGH (ref 0.00–0.07)
Basophils Absolute: 0 10*3/uL (ref 0.0–0.1)
Basophils Relative: 0 %
Eosinophils Absolute: 0.1 10*3/uL (ref 0.0–0.5)
Eosinophils Relative: 2 %
HCT: 34.2 % — ABNORMAL LOW (ref 39.0–52.0)
Hemoglobin: 11.5 g/dL — ABNORMAL LOW (ref 13.0–17.0)
Immature Granulocytes: 2 %
Lymphocytes Relative: 10 %
Lymphs Abs: 0.8 10*3/uL (ref 0.7–4.0)
MCH: 33.2 pg (ref 26.0–34.0)
MCHC: 33.6 g/dL (ref 30.0–36.0)
MCV: 98.8 fL (ref 80.0–100.0)
Monocytes Absolute: 0.6 10*3/uL (ref 0.1–1.0)
Monocytes Relative: 8 %
Neutro Abs: 6.3 10*3/uL (ref 1.7–7.7)
Neutrophils Relative %: 78 %
Platelet Count: 127 10*3/uL — ABNORMAL LOW (ref 150–400)
RBC: 3.46 MIL/uL — ABNORMAL LOW (ref 4.22–5.81)
RDW: 15 % (ref 11.5–15.5)
WBC Count: 8 10*3/uL (ref 4.0–10.5)
nRBC: 0 % (ref 0.0–0.2)

## 2023-08-15 LAB — CMP (CANCER CENTER ONLY)
ALT: 24 U/L (ref 0–44)
AST: 16 U/L (ref 15–41)
Albumin: 3.5 g/dL (ref 3.5–5.0)
Alkaline Phosphatase: 117 U/L (ref 38–126)
Anion gap: 8 (ref 5–15)
BUN: 22 mg/dL (ref 8–23)
CO2: 26 mmol/L (ref 22–32)
Calcium: 8.3 mg/dL — ABNORMAL LOW (ref 8.9–10.3)
Chloride: 105 mmol/L (ref 98–111)
Creatinine: 0.97 mg/dL (ref 0.61–1.24)
GFR, Estimated: >60 mL/min (ref >60)
Glucose, Bld: 183 mg/dL — ABNORMAL HIGH (ref 70–99)
Potassium: 3.9 mmol/L (ref 3.5–5.1)
Sodium: 139 mmol/L (ref 135–145)
Total Bilirubin: 0.7 mg/dL (ref <1.2)
Total Protein: 7 g/dL (ref 6.5–8.1)

## 2023-08-15 MED ORDER — PROCHLORPERAZINE MALEATE 10 MG PO TABS
10.0000 mg | ORAL_TABLET | Freq: Once | ORAL | Status: AC
  Administered 2023-08-15: 10 mg via ORAL
  Filled 2023-08-15: qty 1

## 2023-08-15 MED ORDER — BORTEZOMIB CHEMO SQ INJECTION 3.5 MG (2.5MG/ML)
1.3000 mg/m2 | Freq: Once | INTRAMUSCULAR | Status: AC
Start: 2023-08-15 — End: 2023-08-15
  Administered 2023-08-15: 2.5 mg via SUBCUTANEOUS
  Filled 2023-08-15: qty 1

## 2023-08-15 NOTE — Progress Notes (Signed)
Hematology/Oncology Progress note [phone] [phone]         Patient Care Team: Danella Penton, MD as PCP - General (Internal Medicine) Debbe Odea, MD as PCP - Cardiology (Cardiology) [name], MD as Consulting Physician (Oncology)  CHIEF COMPLAINTS/REASON FOR VISIT:  Multiple myeloma   ASSESSMENT & PLAN:   Cancer Staging  Multiple myeloma [facility]) Staging form: Plasma Cell Myeloma and Plasma Cell Disorders, AJCC 8th Edition - Clinical stage from 04/14/2023: RISS Stage II (Beta-2-microglobulin (mg/L): 8.4, Albumin (g/dL): 2.6, ISS: Stage III, High-risk cytogenetics: Absent, LDH: Normal) - Signed by [name], MD on 04/22/2023   Multiple myeloma (HCC) Bone marrow biopsy was reviewed. 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 Cycle 3 D15 Daratumumab was held due to cytopenia and being on anticoagulation.  S/p 4 cycles  Daratumumab and Velcade with revlimid 10mg  2 weeks on 1 week off  Cycle 4 D15 missed due to hospitalization due to ileus. BM biopsy at [facility] after 4 cycles showed 50-60% plasma cells.  Labs are reviewed and discussed with patient. Discussed with [facility]. Recommend additional 3-4 cycles.  Currently on cycle 5 Dara Rvd,  Revlimid to 15mg  2 weeks on 1 week off, patient did not tolerate 15mg  due to dizziness- only too one dose.  Will swicht to Revlimid 10mg  daily to finish currently cycle. I advise patient to call [facility]olog[facility].   Continue Acyclovir, At high risk of thrombosis, on Eliquis 5mg  BID for A fib Xegeva monthly- hold  due to hypocalcemia.  Recommend calcium and vitamin D supplementation.   Follow up with [facility] in the future evaluation for BM transplant - no amyloidosis on cardiac MRI  CKD (chronic kidney disease) stage 3, GFR 30-59 ml/min (HCC) Encourage oral hydration and avoid  nephrotoxins.    Encounter for antineoplastic chemotherapy Chemotherapy plan as listed above. - hold treatment for now due to recent Ileus. pending decision of BM transplant    Hypocalcemia Recommend patient to take calcium 1200mg  daily. Take vitamin D supplementation.    No orders of the defined types were placed in this encounter.  Follow-up per LOS  We spent sufficient time to discuss many aspect of care, questions were answered to patient's satisfaction.    [name], MD, PhD [facility] Hematology Oncology 08/15/2023     HISTORY OF PRESENTING ILLNESS:  Victor Phillips is a  67 y.o.  male with PMH listed below who was referred to me for anemia  02/11/2023 - 02/14/2023 recent hospitalization due to pneumonia, respiratory failure.  He was found to have a hemoglobin decreased at 6.8, status post PRBC transfusion during admission.  EGD showed gastritis.  Colonoscopy was not remarkable. 02/11/2023 TIBC 221 ferritin 107, iron saturation 16. Patient is currently taking fusion plus Vitamin B12 level in the 300s. His echo showed grade 2 diastolic CHF.  He denies recent chest pain on exertion, shortness of breath on minimal exertion, pre-syncopal episodes, or palpitations He had not noticed any recent bleeding such as epistaxis, hematuria or hematochezia.  He denies over the counter NSAID ingestion.  Oncology History  Multiple myeloma (HCC)  04/14/2023 Initial Diagnosis   Multiple myeloma   03/13/2020 multiple myeloma panel showed M protein 4.3, IgA lambda Free lamda Level 142,-light chain ratio 0.07 04/05/2023 bone marrow biopsy showed hypercellular bone marrow with plasma cell neoplasm.The bone marrow  is hypercellular for age with prominent increase in plasma cells representing 65% of all cells in the aspirate associated with interstitial infiltrates and numerous variably sized aggregates in  the clot and biopsy sections.  The plasma cells display lambda light chain restriction  consistent with plasma cell neoplasm.  Normal cytogenetics.  FISH studies pending.    04/14/2023 Cancer Staging   Staging form: Plasma Cell Myeloma and Plasma Cell Disorders, AJCC 8th Edition - Clinical stage from 04/14/2023: RISS Stage II (Beta-2-microglobulin (mg/L): 8.4, Albumin (g/dL): 2.6, ISS: Stage III, High-risk cytogenetics: Absent, LDH: Normal) - Signed by [name], MD on 04/22/2023 Stage prefix: Initial diagnosis Beta 2 microglobulin range (mg/L): Greater than or equal to 5.5 Albumin range (g/dL): Less than 3.5 Cytogenetics: 1q addition, Other mutation   04/14/2023 Imaging   Skeletal survey 1. No lytic lesions or other intrinsic bony abnormality. 2. Borderline cardiomegaly with an interval decrease in size. 3. Diffuse atheromatous arterial calcifications including bilateral carotid artery calcifications, right greater than left   04/22/2023 -  Chemotherapy   Patient is on Treatment Plan : MYELOMA NEWLY DIAGNOSED TRANSPLANT CANDIDATE DaraVRd (Daratumumab SQ) q21d     04/25/2023 Imaging   PET scan showed  1. Two tiny hypermetabolic lucent lesions in the left posteromedial eighth and ninth ribs. No additional evidence of multiple myeloma. 2. Aortic atherosclerosis (ICD10-I70.0). Coronary artery calcification.   07/14/2023 - 07/19/2023 Hospital Admission   Hospitalized due to ileus.    07/25/2023 Bone Marrow Biopsy   A-C. Peripheral blood and bone marrow, (peripheral smear, aspirate smear, touch preparation, clot section, core biopsy):   Persistent plasma cell myeloma, 50-60% lambda restricted plasma cells on core biopsy. Hypercellular bone marrow (70%) with trilineage hematopoiesis. Negative for increased blasts. Negative for significant dysplasia. Negative for amyloid deposition.     A Fib. Lowe extremity edema bilaterally, and SOB with exertion.  Now on Eliquis 5mg  BID, follows up with cardiology.  Denies chest pain, abdominal pain, nausea, vomiting, diarrhea 08/05/23 He took  a dose of Revlimid 15mg  and felt dizziness.He did not continue revlimid and notified my office on 11/26. He is not willing to try another dose, and decision was made to switch to 10mg . Due to holiday schedule, he has not received 10mg  supply yet.  No dizziness today. + Fatigue   MEDICAL HISTORY:  Past Medical History:  Diagnosis Date   Atrial fibrillation (HCC)    Diabetes mellitus without complication (HCC)    Hypercholesteremia    Hypertension    Multiple myeloma not having achieved remission [facility])     SURGICAL HISTORY: Past Surgical History:  Procedure Laterality Date   COLONOSCOPY N/A 02/13/2023   Procedure: COLONOSCOPY;  Surgeon: Toledo, Boykin Nearing, MD;  Location: [facility] ENDOSCOPY;  Service: Gastroenterology;  Laterality: N/A;   COLONOSCOPY N/A 02/14/2023   Procedure: COLONOSCOPY;  Surgeon: Toledo, Boykin Nearing, MD;  Location: [facility] ENDOSCOPY;  Service: Gastroenterology;  Laterality: N/A;   ESOPHAGOGASTRODUODENOSCOPY N/A 02/13/2023   Procedure: ESOPHAGOGASTRODUODENOSCOPY (EGD);  Surgeon: Toledo, Boykin Nearing, MD;  Location: [facility] ENDOSCOPY;  Service: Gastroenterology;  Laterality: N/A;    SOCIAL HISTORY: Social History   Socioeconomic History   Marital status: Married    Spouse name: Not on file   Number of children: Not on file   Years of education: Not on file   Highest education level: Not on file  Occupational History   Not on file  Tobacco Use   Smoking status: Never   Smokeless tobacco: Never  Vaping Use   Vaping status:  Never Used  Substance and Sexual Activity   Alcohol use: Not Currently    Comment: quit approx 2001   Drug use: No   Sexual activity: Not on file  Other Topics Concern   Not on file  Social History Narrative   Not on file   Social Determinants of Health   Financial Resource Strain: Low Risk  (07/22/2023)   Received from [facility]   Overall Financial Resource Strain (CARDIA)    Difficulty of Paying Living Expenses: Not hard at all   Recent Concern: Financial Resource Strain - High Risk (07/05/2023)   Received from [facility]   Overall Financial Resource Strain (CARDIA)    Difficulty of Paying Living Expenses: Hard  Food Insecurity: No Food Insecurity (07/22/2023)   Received from [facility]   Hunger Vital Sign    Worried About Running Out of Food in the Last Year: Never true    Ran Out of Food in the Last Year: Never true  Recent Concern: Food Insecurity - Food Insecurity Present (07/05/2023)   [facility]   Hunger Vital Sign    Worried About Running Out of Food in the Last Year: Sometimes true    Ran Out of Food in the Last Year: Sometimes true  Transportation Needs: No Transportation Needs (07/22/2023)   Received from [facility]    In the past 12 months, has lack of transportation kept you from medical appointments or from getting medications?: No    Lack of Transportation (Non-Medical): No  Physical Activity: Sufficiently Active (09/24/2020)   Received from [facility], [facility]   Exercise Vital Sign    Days of Exercise per Week: 6 days    Minutes of Exercise per Session: 30 min  Stress: No Stress Concern Present (09/24/2020)   Received from [facility], [facility]   [employer] of Occupational Health - Occupational Stress Questionnaire    Feeling of Stress : Not at all  Social Connections: Socially Integrated (09/24/2020)   Received from [facility], [facility]   Social Connection and Isolation Panel [NHANES]    Frequency of Communication with Friends and Family: More than three times a week    Frequency of Social Gatherings with Friends and Family: Once a week    Attends Religious Services: More than 4 times per year    Active Member of Clubs or Organizations: Yes    Attends  Engineer, structural: More than 4 times per year    Marital Status: Married  Catering manager Violence: Not At Risk (07/14/2023)   Humiliation, Afraid, Rape, and Kick questionnaire    Fear of Current or Ex-Partner: No    Emotionally Abused: No    Physically Abused: No    Sexually Abused: No    FAMILY HISTORY: Family History  Problem Relation Age of Onset   Diabetes Mother    Heart attack Father     ALLERGIES:  has No Known Allergies.  MEDICATIONS:  Current Outpatient Medications  Medication Sig Dispense Refill   acyclovir (ZOVIRAX) 400 MG tablet Take 1 tablet (400 mg total) by mouth 2 (two) times daily. 60 tablet 5   apixaban (ELIQUIS) 5 MG TABS tablet Take 1 tablet (5 mg total) by mouth 2 (two) times daily. 180 tablet 0   atorvastatin (LIPITOR) 10 MG tablet Take 1 tablet  by mouth daily.     calcium carbonate (OS-CAL) 600 MG TABS tablet Take 2 tablets (1,200 mg total) by mouth daily.     cholecalciferol (VITAMIN D3) 25 MCG (1000 UNIT) tablet Take 1 tablet (1,000 Units total) by mouth daily.     furosemide (LASIX) 20 MG tablet Take 1 tablet (20 mg total) by mouth daily as needed for edema or fluid. 30 tablet 0   glipiZIDE (GLUCOTROL XL) 2.5 MG 24 hr tablet Take by mouth.     metoprolol succinate (TOPROL-XL) 50 MG 24 hr tablet Take 1 tablet (50 mg total) by mouth daily. Take with or immediately following a meal. 90 tablet 3   Multiple Vitamin (MULTIVITAMIN WITH MINERALS) TABS tablet Take 1 tablet by mouth daily. 90 tablet 1   pantoprazole (PROTONIX) 40 MG tablet Take 1 tablet (40 mg total) by mouth daily. 30 tablet 1   temazepam (RESTORIL) 15 MG capsule Take by mouth.     acetaminophen (TYLENOL) 325 MG tablet Take 2 tablets (650 mg total) by mouth every 6 (six) hours as needed for mild pain (or Fever >/= 101). (Patient not taking: Reported on 08/05/2023) 90 tablet 1   lenalidomide (REVLIMID) 10 MG capsule Take 1 capsule (10 mg total) by mouth daily. Take for 7 days, then hold  for 7 days. (Short fill) (Patient not taking: Reported on 08/15/2023) 7 capsule 0   ondansetron (ZOFRAN) 8 MG tablet Take 1 tablet (8 mg total) by mouth every 8 (eight) hours as needed for nausea or vomiting. (Patient not taking: Reported on 08/10/2023) 30 tablet 1   polyethylene glycol (MIRALAX) 17 g packet Take 17 g by mouth daily. (Patient not taking: Reported on 08/10/2023) 14 each 0   prochlorperazine (COMPAZINE) 10 MG tablet Take 1 tablet (10 mg total) by mouth every 6 (six) hours as needed for nausea or vomiting. (Patient not taking: Reported on 08/10/2023) 30 tablet 1   senna (SENOKOT) 8.6 MG TABS tablet Take 2 tablets (17.2 mg total) by mouth daily. (Patient not taking: Reported on 08/10/2023) 120 tablet 0   No current facility-administered medications for this visit.    Review of Systems  Constitutional:  Positive for fatigue. Negative for appetite change, chills, fever and unexpected weight change.  HENT:   Negative for hearing loss and voice change.   Eyes:  Negative for eye problems and icterus.  Respiratory:  Negative for chest tightness, cough and shortness of breath.   Cardiovascular:  Positive for leg swelling. Negative for chest pain.  Gastrointestinal:  Negative for abdominal distention and abdominal pain.  Endocrine: Negative for hot flashes.  Genitourinary:  Negative for difficulty urinating, dysuria and frequency.   Musculoskeletal:  Negative for arthralgias.  Skin:  Negative for itching and rash.  Neurological:  Negative for light-headedness and numbness.  Hematological:  Negative for adenopathy. Does not bruise/bleed easily.  Psychiatric/Behavioral:  Negative for confusion.     PHYSICAL EXAMINATION: Vitals:   08/15/23 0901  BP: 134/77  Pulse: 78  Resp: 18  Temp: (!) 97.2 F (36.2 C)   Filed Weights   08/15/23 0901  Weight: 169 lb 1.6 oz (76.7 kg)    Physical Exam Constitutional:      General: He is not in acute distress.    Appearance: He is obese.   HENT:     Head: Normocephalic and atraumatic.  Eyes:     General: No scleral icterus. Cardiovascular:     Rate and Rhythm: Normal rate and regular rhythm.  Pulmonary:  Effort: Pulmonary effort is normal. No respiratory distress.     Comments: Crackles at the base of lung  Abdominal:     General: Bowel sounds are normal. There is no distension.     Palpations: Abdomen is soft.  Musculoskeletal:        General: No deformity. Normal range of motion.     Cervical back: Normal range of motion and neck supple.     Right lower leg: Edema present.     Left lower leg: Edema present.  Skin:    General: Skin is warm and dry.     Findings: No erythema or rash.  Neurological:     Mental Status: He is alert and oriented to person, place, and time. Mental status is at baseline.     Cranial Nerves: No cranial nerve deficit.  Psychiatric:        Mood and Affect: Mood normal.      LABORATORY DATA:  I have reviewed the data as listed    Latest Ref Rng & Units 08/15/2023    8:22 AM 08/10/2023   12:28 PM 08/05/2023    8:22 AM  CBC  WBC 4.0 - 10.5 K/uL 8.0  13.6  5.9   Hemoglobin 13.0 - 17.0 g/dL 01.0  27.2  53.6   Hematocrit 39.0 - 52.0 % 34.2  36.6  31.5   Platelets 150 - 400 K/uL 127  142  198       Latest Ref Rng & Units 08/15/2023    8:22 AM 08/10/2023   12:28 PM 08/05/2023    8:24 AM  CMP  Glucose 70 - 99 mg/dL 644  034  742   BUN 8 - 23 mg/dL 22  27  25    Creatinine 0.61 - 1.24 mg/dL 5.95  6.38  7.56   Sodium 135 - 145 mmol/L 139  131  140   Potassium 3.5 - 5.1 mmol/L 3.9  4.5  4.1   Chloride 98 - 111 mmol/L 105  99  105   CO2 22 - 32 mmol/L 26  23  23    Calcium 8.9 - 10.3 mg/dL 8.3  8.4  8.3   Total Protein 6.5 - 8.1 g/dL 7.0  7.8  7.5   Total Bilirubin <1.2 mg/dL 0.7  0.7  1.0   Alkaline Phos 38 - 126 U/L 117  105  113   AST 15 - 41 U/L 16  16  16    ALT 0 - 44 U/L 24  31  21     Lab Results  Component Value Date   IRON 100 03/14/2023   TIBC 272 03/14/2023    IRONPCTSAT 37 03/14/2023   FERRITIN 249 03/14/2023     RADIOGRAPHIC STUDIES: I have personally reviewed the radiological images as listed and agreed with the findings in the report. MR CARDIAC MORPHOLOGY W WO CONTRAST  Result Date: 07/27/2023 CLINICAL DATA:  Hx of multiple myeloma.  Evaluate for amyloidosis EXAM: CARDIAC MRI TECHNIQUE: The patient was scanned on a 1.5 Tesla Siemens magnet. A dedicated cardiac coil was used. Functional imaging was done using Fiesta sequences. 2,3, and 4 chamber views were done to assess for RWMA's. Modified Simpson's rule using a short axis stack was used to calculate an ejection fraction on a dedicated work Research officer, trade union. The patient received 11 cc of Gadavist. After 10 minutes inversion recovery sequences were used to assess for infiltration and scar tissue. Velocity flow mapping performed in the ascending aorta and main pulmonary artery.  CONTRAST:  11 cc  of Gadavist FINDINGS: 1. Normal left ventricular size, thickness and systolic function (LVEF = 50%). There are no regional wall motion abnormalities. There is no late gadolinium enhancement in the left ventricular myocardium. LVEDV: 133 ml LVESV: 67 ml SV: 66 ml CO: 6.1 L/min Myocardial mass: 118 g LV native T1 value 1092 ms (normal < 1000 ms). LV ECV 27.4%. (Normal <30%). 2. Normal right ventricular size, thickness and systolic function (RVEF = 42%). There are no regional wall motion abnormalities. 3.  Mildly dilated left atrial size.  Normal right atrial size. 4. Normal size of the aortic root, ascending aorta and pulmonary artery. 5.  No significant valvular abnormalities. 6.  Normal pericardium.  No pericardial effusion. IMPRESSION: 1.  Normal LV size, low normal systolic function.  LVEF 50% 2.  No LV LGE or scar. 3.  Normal ECV, no evidence for infiltrative disease. 4.  No significant valvular abnormalities. 5.  Normal RV systolic function. 6.  No evidence for cardiac amyloidosis. Electronically  Signed   By: Debbe Odea M.D.   On: 07/27/2023 17:55   MR CARDIAC VELOCITY FLOW MAP  Result Date: 07/27/2023 CLINICAL DATA:  Hx of multiple myeloma.  Evaluate for amyloidosis EXAM: CARDIAC MRI TECHNIQUE: The patient was scanned on a 1.5 Tesla Siemens magnet. A dedicated cardiac coil was used. Functional imaging was done using Fiesta sequences. 2,3, and 4 chamber views were done to assess for RWMA's. Modified Simpson's rule using a short axis stack was used to calculate an ejection fraction on a dedicated work Research officer, trade union. The patient received 11 cc of Gadavist. After 10 minutes inversion recovery sequences were used to assess for infiltration and scar tissue. Velocity flow mapping performed in the ascending aorta and main pulmonary artery. CONTRAST:  11 cc  of Gadavist FINDINGS: 1. Normal left ventricular size, thickness and systolic function (LVEF = 50%). There are no regional wall motion abnormalities. There is no late gadolinium enhancement in the left ventricular myocardium. LVEDV: 133 ml LVESV: 67 ml SV: 66 ml CO: 6.1 L/min Myocardial mass: 118 g LV native T1 value 1092 ms (normal < 1000 ms). LV ECV 27.4%. (Normal <30%). 2. Normal right ventricular size, thickness and systolic function (RVEF = 42%). There are no regional wall motion abnormalities. 3.  Mildly dilated left atrial size.  Normal right atrial size. 4. Normal size of the aortic root, ascending aorta and pulmonary artery. 5.  No significant valvular abnormalities. 6.  Normal pericardium.  No pericardial effusion. IMPRESSION: 1.  Normal LV size, low normal systolic function.  LVEF 50% 2.  No LV LGE or scar. 3.  Normal ECV, no evidence for infiltrative disease. 4.  No significant valvular abnormalities. 5.  Normal RV systolic function. 6.  No evidence for cardiac amyloidosis. Electronically Signed   By: Debbe Odea M.D.   On: 07/27/2023 17:55   MR CARDIAC VELOCITY FLOW MAP  Result Date: 07/27/2023 CLINICAL DATA:   Hx of multiple myeloma.  Evaluate for amyloidosis EXAM: CARDIAC MRI TECHNIQUE: The patient was scanned on a 1.5 Tesla Siemens magnet. A dedicated cardiac coil was used. Functional imaging was done using Fiesta sequences. 2,3, and 4 chamber views were done to assess for RWMA's. Modified Simpson's rule using a short axis stack was used to calculate an ejection fraction on a dedicated work Research officer, trade union. The patient received 11 cc of Gadavist. After 10 minutes inversion recovery sequences were used to assess for infiltration and scar tissue.  Velocity flow mapping performed in the ascending aorta and main pulmonary artery. CONTRAST:  11 cc  of Gadavist FINDINGS: 1. Normal left ventricular size, thickness and systolic function (LVEF = 50%). There are no regional wall motion abnormalities. There is no late gadolinium enhancement in the left ventricular myocardium. LVEDV: 133 ml LVESV: 67 ml SV: 66 ml CO: 6.1 L/min Myocardial mass: 118 g LV native T1 value 1092 ms (normal < 1000 ms). LV ECV 27.4%. (Normal <30%). 2. Normal right ventricular size, thickness and systolic function (RVEF = 42%). There are no regional wall motion abnormalities. 3.  Mildly dilated left atrial size.  Normal right atrial size. 4. Normal size of the aortic root, ascending aorta and pulmonary artery. 5.  No significant valvular abnormalities. 6.  Normal pericardium.  No pericardial effusion. IMPRESSION: 1.  Normal LV size, low normal systolic function.  LVEF 50% 2.  No LV LGE or scar. 3.  Normal ECV, no evidence for infiltrative disease. 4.  No significant valvular abnormalities. 5.  Normal RV systolic function. 6.  No evidence for cardiac amyloidosis. Electronically Signed   By: Debbe Odea M.D.   On: 07/27/2023 17:55   DG Abd 1 View  Result Date: 07/17/2023 CLINICAL DATA:  Distension EXAM: ABDOMEN - 1 VIEW COMPARISON:  07/14/2023, CT 07/14/2023 FINDINGS: Removal of esophageal tube.persistent gaseous dilatation of right  abdominal small bowel loops measuring up to 3.5 cm with mild diffuse air distension of the colon. No radiopaque calculi. IMPRESSION: Persistent gaseous dilatation of right abdominal small bowel loops with mild diffuse air distension of the colon. Findings could be secondary to ileus or partial obstruction. Electronically Signed   By: Jasmine Pang M.D.   On: 07/17/2023 19:15   DG Chest 2 View  Result Date: 07/17/2023 CLINICAL DATA:  Shortness of breath. EXAM: CHEST - 2 VIEW COMPARISON:  07/14/2023 FINDINGS: Cardiopericardial silhouette is at upper limits of normal for size. Mild vascular congestion without edema. No focal consolidation or pneumothorax. Tiny bilateral pleural effusions evident. No acute bony abnormality. IMPRESSION: 1. Mild vascular congestion without edema. 2. Tiny bilateral pleural effusions. Electronically Signed   By: Kennith Center M.D.   On: 07/17/2023 11:30

## 2023-08-15 NOTE — Assessment & Plan Note (Signed)
 Chemotherapy plan as listed above. - hold treatment for now due to recent Ileus. pending decision of BM transplant

## 2023-08-15 NOTE — Assessment & Plan Note (Addendum)
Bone marrow biopsy was reviewed. 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 Cycle 3 D15 Daratumumab was held due to cytopenia and being on anticoagulation.  S/p 4 cycles  Daratumumab and Velcade with revlimid 10mg  2 weeks on 1 week off  Cycle 4 D15 missed due to hospitalization due to ileus. BM biopsy at [facility] after 4 cycles showed 50-60% plasma cells.  Labs are reviewed and discussed with patient. Discussed with [facility]. Recommend additional 3-4 cycles.  Currently on cycle 5 Dara Rvd,  Revlimid to 15mg  2 weeks on 1 week off, patient did not tolerate 15mg  due to dizziness- only too one dose.  Will swicht to Revlimid 10mg  daily to finish currently cycle. I advise patient to call [facility].   Continue Acyclovir, At high risk of thrombosis, on Eliquis 5mg  BID for A fib Xegeva monthly- hold  due to hypocalcemia.  Recommend calcium and vitamin D supplementation.   Follow up with [facility] in the future evaluation for BM transplant - no amyloidosis on cardiac MRI

## 2023-08-15 NOTE — Assessment & Plan Note (Signed)
Encourage oral hydration and avoid nephrotoxins.   

## 2023-08-15 NOTE — Assessment & Plan Note (Signed)
 Recommend patient to take calcium 1200mg  daily. Take vitamin D supplementation.

## 2023-08-15 NOTE — Patient Instructions (Signed)
 [facility] - [facility]  Discharge Instructions: Thank you for choosing [facility] to provide your oncology and hematology care.  If you have a lab appointment with the Cancer Center, please go directly to the Cancer Center and check in at the registration area.  Wear comfortable clothing and clothing appropriate for easy access to any Portacath or PICC line.   We strive to give you quality time with your provider. You may need to reschedule your appointment if you arrive late (15 or more minutes).  Arriving late affects you and other patients whose appointments are after yours.  Also, if you miss three or more appointments without notifying the office, you may be dismissed from the clinic at the provider's discretion.      For prescription refill requests, have your pharmacy contact our office and allow 72 hours for refills to be completed.    Today you received the following chemotherapy and/or immunotherapy agents VELCADE       To help prevent nausea and vomiting after your treatment, we encourage you to take your nausea medication as directed.  BELOW ARE SYMPTOMS THAT SHOULD BE REPORTED IMMEDIATELY: *FEVER GREATER THAN 100.4 F (38 C) OR HIGHER *CHILLS OR SWEATING *NAUSEA AND VOMITING THAT IS NOT CONTROLLED WITH YOUR NAUSEA MEDICATION *UNUSUAL SHORTNESS OF BREATH *UNUSUAL BRUISING OR BLEEDING *URINARY PROBLEMS (pain or burning when urinating, or frequent urination) *BOWEL PROBLEMS (unusual diarrhea, constipation, pain near the anus) TENDERNESS IN MOUTH AND THROAT WITH OR WITHOUT PRESENCE OF ULCERS (sore throat, sores in mouth, or a toothache) UNUSUAL RASH, SWELLING OR PAIN  UNUSUAL VAGINAL DISCHARGE OR ITCHING   Items with * indicate a potential emergency and should be followed up as soon as possible or go to the Emergency Department if any problems should occur.  Please show the CHEMOTHERAPY ALERT CARD or IMMUNOTHERAPY  ALERT CARD at check-in to the Emergency Department and triage nurse.  Should you have questions after your visit or need to cancel or reschedule your appointment, please contact [facility] - A DEPT OF [facility]igh[facility] Brideg[facility]  [phone] and follow the prompts.  Office hours are 8:00 a.m. to 4:30 p.m. Monday - Friday. Please note that voicemails left after 4:00 p.m. may not be returned until the following business day.  We are closed weekends and major holidays. You have access to a nurse at all times for urgent questions. Please call the main number to the clinic [phone] and follow the prompts.  For any non-urgent questions, you may also contact your provider using MyChart. We now offer e-Visits for anyone 56 and older to request care online for non-urgent symptoms. For details visit [email or web address].   Also download the MyChart app! Go to the app store, search "MyChart", open the app, select [place], and log in with your MyChart username and password.  Bortezomib Injection What is this medication? BORTEZOMIB (bor TEZ oh mib) treats lymphoma. It may also be used to treat multiple myeloma, a type of bone marrow cancer. It works by blocking a protein that causes cancer cells to grow and multiply. This helps to slow or stop the spread of cancer cells. This medicine may be used for other purposes; ask your health care provider or pharmacist if you have questions. COMMON BRAND NAME(S): Velcade What should I tell my care team before I take this medication? They need to know if you have any of these conditions: Dehydration Diabetes  Heart disease Liver disease Tingling of the fingers or toes or other nerve disorder An unusual or allergic reaction to bortezomib, other medications, foods, dyes, or preservatives If you or your partner are pregnant or trying to get pregnant Breastfeeding How should I use this medication? This medication is injected into a vein or  under the skin. It is given by your care team in a hospital or clinic setting. Talk to your care team about the use of this medication in children. Special care may be needed. Overdosage: If you think you have taken too much of this medicine contact a poison control center or emergency room at once. NOTE: This medicine is only for you. Do not share this medicine with others. What if I miss a dose? Keep appointments for follow-up doses. It is important not to miss your dose. Call your care team if you are unable to keep an appointment. What may interact with this medication? Ketoconazole Rifampin This list may not describe all possible interactions. Give your health care provider a list of all the medicines, herbs, non-prescription drugs, or dietary supplements you use. Also tell them if you smoke, drink alcohol, or use illegal drugs. Some items may interact with your medicine. What should I watch for while using this medication? Your condition will be monitored carefully while you are receiving this medication. You may need blood work while taking this medication. This medication may affect your coordination, reaction time, or judgment. Do not drive or operate machinery until you know how this medication affects you. Sit up or stand slowly to reduce the risk of dizzy or fainting spells. Drinking alcohol with this medication can increase the risk of these side effects. This medication may increase your risk of getting an infection. Call your care team for advice if you get a fever, chills, sore throat, or other symptoms of a cold or flu. Do not treat yourself. Try to avoid being around people who are sick. Check with your care team if you have severe diarrhea, nausea, and vomiting, or if you sweat a lot. The loss of too much body fluid may make it dangerous for you to take this medication. Talk to your care team if you may be pregnant. Serious birth defects can occur if you take this medication during  pregnancy and for 7 months after the last dose. You will need a negative pregnancy test before starting this medication. Contraception is recommended while taking this medication and for 7 months after the last dose. Your care team can help you find the option that works for you. If your partner can get pregnant, use a condom during sex while taking this medication and for 4 months after the last dose. Do not breastfeed while taking this medication and for 2 months after the last dose. This medication may cause infertility. Talk to your care team if you are concerned about your fertility. What side effects may I notice from receiving this medication? Side effects that you should report to your care team as soon as possible: Allergic reactions--skin rash, itching, hives, swelling of the face, lips, tongue, or throat Bleeding--bloody or black, tar-like stools, vomiting blood or brown material that looks like coffee grounds, red or dark brown urine, small red or purple spots on skin, unusual bruising or bleeding Bleeding in the brain--severe headache, stiff neck, confusion, dizziness, change in vision, numbness or weakness of the face, arm, or leg, trouble speaking, trouble walking, vomiting Bowel blockage--stomach cramping, unable to have a bowel movement  or pass gas, loss of appetite, vomiting Heart failure--shortness of breath, swelling of the ankles, feet, or hands, sudden weight gain, unusual weakness or fatigue Infection--fever, chills, cough, sore throat, wounds that don't heal, pain or trouble when passing urine, general feeling of discomfort or being unwell Liver injury--right upper belly pain, loss of appetite, nausea, light-colored stool, dark yellow or brown urine, yellowing skin or eyes, unusual weakness or fatigue Low blood pressure--dizziness, feeling faint or lightheaded, blurry vision Lung injury--shortness of breath or trouble breathing, cough, spitting up blood, chest pain, fever Pain,  tingling, or numbness in the hands or feet Severe or prolonged diarrhea Stomach pain, bloody diarrhea, pale skin, unusual weakness or fatigue, decrease in the amount of urine, which may be signs of hemolytic uremic syndrome Sudden and severe headache, confusion, change in vision, seizures, which may be signs of posterior reversible encephalopathy syndrome (PRES) TTP--purple spots on the skin or inside the mouth, pale skin, yellowing skin or eyes, unusual weakness or fatigue, fever, fast or irregular heartbeat, confusion, change in vision, trouble speaking, trouble walking Tumor lysis syndrome (TLS)--nausea, vomiting, diarrhea, decrease in the amount of urine, dark urine, unusual weakness or fatigue, confusion, muscle pain or cramps, fast or irregular heartbeat, joint pain Side effects that usually do not require medical attention (report to your care team if they continue or are bothersome): Constipation Diarrhea Fatigue Loss of appetite Nausea This list may not describe all possible side effects. Call your doctor for medical advice about side effects. You may report side effects to FDA at 1-800-FDA-1088. Where should I keep my medication? This medication is given in a hospital or clinic. It will not be stored at home. NOTE: This sheet is a summary. It may not cover all possible information. If you have questions about this medicine, talk to your doctor, pharmacist, or health care provider.  2024 Elsevier/Gold Standard (2022-02-02 00:00:00)

## 2023-08-18 ENCOUNTER — Inpatient Hospital Stay: Payer: Medicare HMO

## 2023-08-18 VITALS — BP 130/70 | HR 100 | Temp 97.2°F | Resp 16

## 2023-08-18 DIAGNOSIS — C9000 Multiple myeloma not having achieved remission: Secondary | ICD-10-CM

## 2023-08-18 DIAGNOSIS — Z5112 Encounter for antineoplastic immunotherapy: Secondary | ICD-10-CM | POA: Diagnosis not present

## 2023-08-18 MED ORDER — PROCHLORPERAZINE MALEATE 10 MG PO TABS
10.0000 mg | ORAL_TABLET | Freq: Once | ORAL | Status: AC
  Administered 2023-08-18: 10 mg via ORAL
  Filled 2023-08-18: qty 1

## 2023-08-18 MED ORDER — BORTEZOMIB CHEMO SQ INJECTION 3.5 MG (2.5MG/ML)
1.3000 mg/m2 | Freq: Once | INTRAMUSCULAR | Status: AC
  Administered 2023-08-18: 2.5 mg via SUBCUTANEOUS
  Filled 2023-08-18: qty 1

## 2023-08-18 NOTE — Patient Instructions (Signed)
[facility]  Discharge Instructions: Thank you for choosing [facility] to provide your oncology and hematology care.  If you have a lab appointment with the Cancer Center, please go directly to the Cancer Center and check in at the registration area.  Wear comfortable clothing and clothing appropriate for easy access to any Portacath or PICC line.   We strive to give you quality time with your provider. You may need to reschedule your appointment if you arrive late (15 or more minutes).  Arriving late affects you and other patients whose appointments are after yours.  Also, if you miss three or more appointments without notifying the office, you may be dismissed from the clinic at the provider's discretion.      For prescription refill requests, have your pharmacy contact our office and allow 72 hours for refills to be completed.    Today you received the following chemotherapy and/or immunotherapy agents- velcade      To help prevent nausea and vomiting after your treatment, we encourage you to take your nausea medication as directed.  BELOW ARE SYMPTOMS THAT SHOULD BE REPORTED IMMEDIATELY: *FEVER GREATER THAN 100.4 F (38 C) OR HIGHER *CHILLS OR SWEATING *NAUSEA AND VOMITING THAT IS NOT CONTROLLED WITH YOUR NAUSEA MEDICATION *UNUSUAL SHORTNESS OF BREATH *UNUSUAL BRUISING OR BLEEDING *URINARY PROBLEMS (pain or burning when urinating, or frequent urination) *BOWEL PROBLEMS (unusual diarrhea, constipation, pain near the anus) TENDERNESS IN MOUTH AND THROAT WITH OR WITHOUT PRESENCE OF ULCERS (sore throat, sores in mouth, or a toothache) UNUSUAL RASH, SWELLING OR PAIN  UNUSUAL VAGINAL DISCHARGE OR ITCHING   Items with * indicate a potential emergency and should be followed up as soon as possible or go to the Emergency Department if any problems should occur.  Please show the CHEMOTHERAPY ALERT CARD or IMMUNOTHERAPY  ALERT CARD at check-in to the Emergency Department and triage nurse.  Should you have questions after your visit or need to cancel or reschedule your appointment, please contact [facility] - A DEPT OF [facility]  [phone] and follow the prompts.  Office hours are 8:00 a.m. to 4:30 p.m. Monday - Friday. Please note that voicemails left after 4:00 p.m. may not be returned until the following business day.  We are closed weekends and major holidays. You have access to a nurse at all times for urgent questions. Please call the main number to the clinic [phone] and follow the prompts.  For any non-urgent questions, you may also contact your provider using MyChart. We now offer e-Visits for anyone 47 and older to request care online for non-urgent symptoms. For details visit [email or web address].   Also download the MyChart app! Go to the app store, search "MyChart", open the app, select [place], and log in with your MyChart username and password.

## 2023-08-22 ENCOUNTER — Other Ambulatory Visit: Payer: Self-pay | Admitting: *Deleted

## 2023-08-22 DIAGNOSIS — C9000 Multiple myeloma not having achieved remission: Secondary | ICD-10-CM

## 2023-08-23 ENCOUNTER — Encounter: Payer: Self-pay | Admitting: Oncology

## 2023-08-23 ENCOUNTER — Other Ambulatory Visit: Payer: Self-pay

## 2023-08-23 MED ORDER — LENALIDOMIDE 10 MG PO CAPS: 10.0000 mg | ORAL_CAPSULE | Freq: Every day | ORAL | 0 refills | Status: DC

## 2023-08-23 NOTE — Progress Notes (Signed)
Error

## 2023-08-29 ENCOUNTER — Inpatient Hospital Stay: Payer: Medicare HMO

## 2023-08-29 ENCOUNTER — Telehealth: Payer: Self-pay | Admitting: Cardiology

## 2023-08-29 ENCOUNTER — Encounter: Payer: Self-pay | Admitting: Oncology

## 2023-08-29 ENCOUNTER — Telehealth: Payer: Self-pay | Admitting: *Deleted

## 2023-08-29 ENCOUNTER — Inpatient Hospital Stay (HOSPITAL_BASED_OUTPATIENT_CLINIC_OR_DEPARTMENT_OTHER): Payer: Medicare HMO | Admitting: Oncology

## 2023-08-29 VITALS — BP 121/86 | HR 94 | Temp 96.0°F | Resp 18 | Wt 181.3 lb

## 2023-08-29 DIAGNOSIS — C9000 Multiple myeloma not having achieved remission: Secondary | ICD-10-CM

## 2023-08-29 DIAGNOSIS — Z5111 Encounter for antineoplastic chemotherapy: Secondary | ICD-10-CM | POA: Diagnosis not present

## 2023-08-29 DIAGNOSIS — I4891 Unspecified atrial fibrillation: Secondary | ICD-10-CM

## 2023-08-29 DIAGNOSIS — T451X5A Adverse effect of antineoplastic and immunosuppressive drugs, initial encounter: Secondary | ICD-10-CM

## 2023-08-29 DIAGNOSIS — Z5112 Encounter for antineoplastic immunotherapy: Secondary | ICD-10-CM | POA: Diagnosis not present

## 2023-08-29 DIAGNOSIS — N1832 Chronic kidney disease, stage 3b: Secondary | ICD-10-CM

## 2023-08-29 DIAGNOSIS — D696 Thrombocytopenia, unspecified: Secondary | ICD-10-CM

## 2023-08-29 DIAGNOSIS — D6481 Anemia due to antineoplastic chemotherapy: Secondary | ICD-10-CM

## 2023-08-29 DIAGNOSIS — R600 Localized edema: Secondary | ICD-10-CM

## 2023-08-29 LAB — CBC WITH DIFFERENTIAL (CANCER CENTER ONLY)
Abs Immature Granulocytes: 0.03 10*3/uL (ref 0.00–0.07)
Basophils Absolute: 0 10*3/uL (ref 0.0–0.1)
Basophils Relative: 0 %
Eosinophils Absolute: 0.1 10*3/uL (ref 0.0–0.5)
Eosinophils Relative: 2 %
HCT: 28.7 % — ABNORMAL LOW (ref 39.0–52.0)
Hemoglobin: 9.5 g/dL — ABNORMAL LOW (ref 13.0–17.0)
Immature Granulocytes: 1 %
Lymphocytes Relative: 10 %
Lymphs Abs: 0.6 10*3/uL — ABNORMAL LOW (ref 0.7–4.0)
MCH: 33.2 pg (ref 26.0–34.0)
MCHC: 33.1 g/dL (ref 30.0–36.0)
MCV: 100.3 fL — ABNORMAL HIGH (ref 80.0–100.0)
Monocytes Absolute: 0.7 10*3/uL (ref 0.1–1.0)
Monocytes Relative: 12 %
Neutro Abs: 4.2 10*3/uL (ref 1.7–7.7)
Neutrophils Relative %: 75 %
Platelet Count: 171 10*3/uL (ref 150–400)
RBC: 2.86 MIL/uL — ABNORMAL LOW (ref 4.22–5.81)
RDW: 14.6 % (ref 11.5–15.5)
WBC Count: 5.6 10*3/uL (ref 4.0–10.5)
nRBC: 0 % (ref 0.0–0.2)

## 2023-08-29 LAB — CMP (CANCER CENTER ONLY)
ALT: 25 U/L (ref 0–44)
AST: 17 U/L (ref 15–41)
Albumin: 3.6 g/dL (ref 3.5–5.0)
Alkaline Phosphatase: 116 U/L (ref 38–126)
Anion gap: 9 (ref 5–15)
BUN: 23 mg/dL (ref 8–23)
CO2: 26 mmol/L (ref 22–32)
Calcium: 8.6 mg/dL — ABNORMAL LOW (ref 8.9–10.3)
Chloride: 105 mmol/L (ref 98–111)
Creatinine: 0.97 mg/dL (ref 0.61–1.24)
GFR, Estimated: >60 mL/min (ref >60)
Glucose, Bld: 181 mg/dL — ABNORMAL HIGH (ref 70–99)
Potassium: 4.4 mmol/L (ref 3.5–5.1)
Sodium: 140 mmol/L (ref 135–145)
Total Bilirubin: 0.9 mg/dL (ref <1.2)
Total Protein: 7.4 g/dL (ref 6.5–8.1)

## 2023-08-29 MED ORDER — DIPHENHYDRAMINE HCL 25 MG PO CAPS
50.0000 mg | ORAL_CAPSULE | Freq: Once | ORAL | Status: AC
  Administered 2023-08-29: 50 mg via ORAL
  Filled 2023-08-29: qty 2

## 2023-08-29 MED ORDER — DARATUMUMAB-HYALURONIDASE-FIHJ 1800-30000 MG-UT/15ML ~~LOC~~ SOLN
1800.0000 mg | Freq: Once | SUBCUTANEOUS | Status: AC
Start: 2023-08-29 — End: 2023-08-29
  Administered 2023-08-29: 1800 mg via SUBCUTANEOUS
  Filled 2023-08-29: qty 15

## 2023-08-29 MED ORDER — FUROSEMIDE 20 MG PO TABS: 20.0000 mg | ORAL_TABLET | Freq: Every day | ORAL | 0 refills | Status: DC | PRN

## 2023-08-29 MED ORDER — BORTEZOMIB CHEMO SQ INJECTION 3.5 MG (2.5MG/ML)
1.3000 mg/m2 | Freq: Once | INTRAMUSCULAR | Status: AC
  Administered 2023-08-29: 2.5 mg via SUBCUTANEOUS
  Filled 2023-08-29: qty 1

## 2023-08-29 MED ORDER — DEXAMETHASONE 4 MG PO TABS
20.0000 mg | ORAL_TABLET | Freq: Once | ORAL | Status: AC
  Administered 2023-08-29: 20 mg via ORAL
  Filled 2023-08-29: qty 5

## 2023-08-29 MED ORDER — ACETAMINOPHEN 325 MG PO TABS
650.0000 mg | ORAL_TABLET | Freq: Once | ORAL | Status: AC
Start: 2023-08-29 — End: 2023-08-29
  Administered 2023-08-29: 650 mg via ORAL
  Filled 2023-08-29: qty 2

## 2023-08-29 NOTE — Addendum Note (Signed)
Addended by: [name] on: 08/29/2023 03:53 PM   Modules accepted: Orders

## 2023-08-29 NOTE — Progress Notes (Signed)
Hematology/Oncology Progress note [phone] [phone]         Patient Care Team: Danella Penton, MD as PCP - General (Internal Medicine) Debbe Odea, MD as PCP - Cardiology (Cardiology) [name], MD as Consulting Physician (Oncology)  CHIEF COMPLAINTS/REASON FOR VISIT:  Multiple myeloma   ASSESSMENT & PLAN:   Cancer Staging  Multiple myeloma [facility]) Staging form: Plasma Cell Myeloma and Plasma Cell Disorders, AJCC 8th Edition - Clinical stage from 04/14/2023: RISS Stage II (Beta-2-microglobulin (mg/L): 8.4, Albumin (g/dL): 2.6, ISS: Stage III, High-risk cytogenetics: Absent, LDH: Normal) - Signed by [name], MD on 04/22/2023   Multiple myeloma (HCC) Bone marrow biopsy was reviewed. 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 Cycle 3 D15 Daratumumab was held due to cytopenia and being on anticoagulation.  S/p 4 cycles  Daratumumab and Velcade with revlimid 10mg  2 weeks on 1 week off  Cycle 4 D15 missed due to hospitalization due to ileus. BM biopsy at [facility] after 4 cycles showed 50-60% plasma cells.  Labs are reviewed and discussed with patient. Discussed with [facility]. Recommend additional 3-4 cycles.  cycle 5 Dara Rvd,  Revlimid to 15mg  2 weeks on 1 week off, patient did not tolerate 15mg  due to dizziness- only took one dose and he declined additional doses. He took Revlimid 10mg  for 1 week for cycle 5.  Proceed with cycle 6 Dara Rvd  Revlimid 10mg  daily 2 weeks on 1 week off.   Continue Acyclovir, At high risk of thrombosis, on Eliquis 5mg  BID for A fib Xegeva monthly- hold  due to hypocalcemia.  Recommend calcium and vitamin D supplementation.   Follow up with [facility] in the future evaluation for BM transplant - no amyloidosis on cardiac MRI  Atrial fibrillation (HCC) Echo was done on 06/14/23  LVEF 50% Follow up with cardiology for evaluation.  Continue Eliquis 5mg  BID   CKD (chronic kidney disease) stage 3, GFR 30-59 ml/min (HCC) Encourage oral hydration and avoid nephrotoxins.    Encounter for antineoplastic chemotherapy Chemotherapy plan as listed above. - hold treatment for now due to recent Ileus. pending decision of BM transplant    Thrombocytopenia (HCC) Chemotherapy induced. Close monitor  Hypocalcemia Recommend patient to take calcium 1200mg  daily. Take vitamin D supplementation.   Bilateral lower extremity edema No DVT on US Echo LVEF 50% -55%. 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 There maybe a component of anemia of CKD.  Monitor.    Orders Placed This Encounter  Procedures   Multiple Myeloma Panel (SPEP&IFE w/QIG)    Standing Status:   Future    Expected Date:   09/19/2023    Expiration Date:   09/18/2024   Kappa/lambda light chains    Standing Status:   Future    Expected Date:   09/19/2023    Expiration Date:   09/18/2024   CBC with Differential (Cancer Center Only)    Standing Status:   Future    Expected Date:   09/19/2023    Expiration Date:   09/18/2024   CMP (Cancer Center only)    Standing Status:   Future    Expected Date:   09/19/2023    Expiration Date:   09/18/2024   CBC with Differential (Cancer Center Only)  Standing Status:   Future    Expected Date:   09/26/2023    Expiration Date:   09/25/2024   CMP (Cancer Center only)    Standing Status:   Future    Expected Date:   09/26/2023    Expiration Date:   09/25/2024   Follow-up per LOS  We spent sufficient time to discuss many aspect of care, questions were answered to patient's satisfaction.    [name], MD, PhD [facility] Hematology Oncology 08/29/2023     HISTORY OF PRESENTING ILLNESS:  Victor Phillips is a  67 y.o.  male with PMH listed below who was referred to me for  anemia  02/11/2023 - 02/14/2023 recent hospitalization due to pneumonia, respiratory failure.  He was found to have a hemoglobin decreased at 6.8, status post PRBC transfusion during admission.  EGD showed gastritis.  Colonoscopy was not remarkable. 02/11/2023 TIBC 221 ferritin 107, iron saturation 16. Patient is currently taking fusion plus Vitamin B12 level in the 300s. His echo showed grade 2 diastolic CHF.  He denies recent chest pain on exertion, shortness of breath on minimal exertion, pre-syncopal episodes, or palpitations He had not noticed any recent bleeding such as epistaxis, hematuria or hematochezia.  He denies over the counter NSAID ingestion.  Oncology History  Multiple myeloma (HCC)  04/14/2023 Initial Diagnosis   Multiple myeloma   03/13/2020 multiple myeloma panel showed M protein 4.3, IgA lambda Free lamda Level 142,-light chain ratio 0.07 04/05/2023 bone marrow biopsy showed hypercellular bone marrow with plasma cell neoplasm.The bone marrow is hypercellular for age with prominent increase in plasma cells representing 65% of all cells in the aspirate associated with interstitial infiltrates and numerous variably sized aggregates in  the clot and biopsy sections.  The plasma cells display lambda light chain restriction consistent with plasma cell neoplasm.  Normal cytogenetics.  FISH studies pending.    04/14/2023 Cancer Staging   Staging form: Plasma Cell Myeloma and Plasma Cell Disorders, AJCC 8th Edition - Clinical stage from 04/14/2023: RISS Stage II (Beta-2-microglobulin (mg/L): 8.4, Albumin (g/dL): 2.6, ISS: Stage III, High-risk cytogenetics: Absent, LDH: Normal) - Signed by [name], MD on 04/22/2023 Stage prefix: Initial diagnosis Beta 2 microglobulin range (mg/L): Greater than or equal to 5.5 Albumin range (g/dL): Less than 3.5 Cytogenetics: 1q addition, Other mutation   04/14/2023 Imaging   Skeletal survey 1. No lytic lesions or other intrinsic bony abnormality. 2.  Borderline cardiomegaly with an interval decrease in size. 3. Diffuse atheromatous arterial calcifications including bilateral carotid artery calcifications, right greater than left   04/22/2023 -  Chemotherapy   Patient is on Treatment Plan : MYELOMA NEWLY DIAGNOSED TRANSPLANT CANDIDATE DaraVRd (Daratumumab SQ) q21d     04/25/2023 Imaging   PET scan showed  1. Two tiny hypermetabolic lucent lesions in the left posteromedial eighth and ninth ribs. No additional evidence of multiple myeloma. 2. Aortic atherosclerosis (ICD10-I70.0). Coronary artery calcification.   07/14/2023 - 07/19/2023 Hospital Admission   Hospitalized due to ileus.    07/25/2023 Bone Marrow Biopsy   A-C. Peripheral blood and bone marrow, (peripheral smear, aspirate smear, touch preparation, clot section, core biopsy):   Persistent plasma cell myeloma, 50-60% lambda restricted plasma cells on core biopsy. Hypercellular bone marrow (70%) with trilineage hematopoiesis. Negative for increased blasts. Negative for significant dysplasia. Negative for amyloid deposition.     A Fib. Lowe extremity edema bilaterally, and SOB with exertion.  Now on Eliquis 5mg  BID, follows up with cardiology.  Denies chest pain,  abdominal pain, nausea, vomiting, diarrhea + Fatigue + bilateral lower extremity edema, he takes Lasix 20mg  daily PRN and edema has improved.  + insomnia, reports that his PCP has started him on insomnia medication but he does not know what medication he was started on. He denies depression.   MEDICAL HISTORY:  Past Medical History:  Diagnosis Date   Atrial fibrillation (HCC)    Diabetes mellitus without complication (HCC)    Hypercholesteremia    Hypertension    Multiple myeloma not having achieved remission [facility])     SURGICAL HISTORY: Past Surgical History:  Procedure Laterality Date   COLONOSCOPY N/A 02/13/2023   Procedure: COLONOSCOPY;  Surgeon: Toledo, Boykin Nearing, MD;  Location: [facility] ENDOSCOPY;  Service:  Gastroenterology;  Laterality: N/A;   COLONOSCOPY N/A 02/14/2023   Procedure: COLONOSCOPY;  Surgeon: Toledo, Boykin Nearing, MD;  Location: [facility] ENDOSCOPY;  Service: Gastroenterology;  Laterality: N/A;   ESOPHAGOGASTRODUODENOSCOPY N/A 02/13/2023   Procedure: ESOPHAGOGASTRODUODENOSCOPY (EGD);  Surgeon: Toledo, Boykin Nearing, MD;  Location: [facility] ENDOSCOPY;  Service: Gastroenterology;  Laterality: N/A;    SOCIAL HISTORY: Social History   Socioeconomic History   Marital status: Married    Spouse name: Not on file   Number of children: Not on file   Years of education: Not on file   Highest education level: Not on file  Occupational History   Not on file  Tobacco Use   Smoking status: Never   Smokeless tobacco: Never  Vaping Use   Vaping status: Never Used  Substance and Sexual Activity   Alcohol use: Not Currently    Comment: quit approx 2001   Drug use: No   Sexual activity: Not on file  Other Topics Concern   Not on file  Social History Narrative   Not on file   Social Drivers of Health   Financial Resource Strain: Low Risk  (07/22/2023)   Received from [facility]   Overall Financial Resource Strain (CARDIA)    Difficulty of Paying Living Expenses: Not hard at all  Recent Concern: Financial Resource Strain - High Risk (07/05/2023)   Received from [facility]   Overall Financial Resource Strain (CARDIA)    Difficulty of Paying Living Expenses: Hard  Food Insecurity: No Food Insecurity (07/22/2023)   Received from [facility]   Hunger Vital Sign    Worried About Running Out of Food in the Last Year: Never true    Ran Out of Food in the Last Year: Never true  Recent Concern: Food Insecurity - Food Insecurity Present (07/05/2023)   Received from [facility]   Hunger Vital Sign    Worried About Running Out of Food in the Last Year: Sometimes true    Ran Out of Food in the Last Year: Sometimes true  Transportation  Needs: No Transportation Needs (07/22/2023)   Received from [facility] - Transportation    In the past 12 months, has lack of transportation kept you from medical appointments or from getting medications?: No    Lack of Transportation (Non-Medical): No  Physical Activity: Sufficiently Active (09/24/2020)   Received from [facility], [facility]   Exercise Vital Sign    Days of Exercise per Week: 6 days    Minutes of Exercise per Session: 30 min  Stress: No Stress Concern Present (09/24/2020)   Received from [facility], [facility]   [employer] of  Occupational Health - Occupational Stress Questionnaire    Feeling of Stress : Not at all  Social Connections: Socially Integrated (09/24/2020)   Received from [facility], [facility]   Social Connection and Isolation Panel [NHANES]    Frequency of Communication with Friends and Family: More than three times a week    Frequency of Social Gatherings with Friends and Family: Once a week    Attends Religious Services: More than 4 times per year    Active Member of [employer] or Organizations: Yes    Attends Engineer, structural: More than 4 times per year    Marital Status: Married  Catering manager Violence: Not At Risk (07/14/2023)   Humiliation, Afraid, Rape, and Kick questionnaire    Fear of Current or Ex-Partner: No    Emotionally Abused: No    Physically Abused: No    Sexually Abused: No    FAMILY HISTORY: Family History  Problem Relation Age of Onset   Diabetes Mother    Heart attack Father     ALLERGIES:  has no known allergies.  MEDICATIONS:  Current Outpatient Medications  Medication Sig Dispense Refill   acyclovir (ZOVIRAX) 400 MG tablet Take 1 tablet (400 mg total) by mouth 2 (two) times daily. 60 tablet 5   apixaban (ELIQUIS) 5 MG TABS tablet Take 1 tablet (5 mg total) by mouth  2 (two) times daily. 180 tablet 0   atorvastatin (LIPITOR) 10 MG tablet Take 1 tablet by mouth daily.     calcium carbonate (OS-CAL) 600 MG TABS tablet Take 2 tablets (1,200 mg total) by mouth daily.     cholecalciferol (VITAMIN D3) 25 MCG (1000 UNIT) tablet Take 1 tablet (1,000 Units total) by mouth daily.     furosemide (LASIX) 20 MG tablet Take 1 tablet (20 mg total) by mouth daily as needed for edema or fluid. 30 tablet 0   glipiZIDE (GLUCOTROL XL) 2.5 MG 24 hr tablet Take by mouth.     lenalidomide (REVLIMID) 10 MG capsule Take 1 capsule (10 mg total) by mouth daily. Take 1 capsule (10 mg total) by mouth daily. Take for 14 days, then hold for 7 days off. Repeat every 21 days. 14 capsule 0   metoprolol succinate (TOPROL-XL) 50 MG 24 hr tablet Take 1 tablet (50 mg total) by mouth daily. Take with or immediately following a meal. 90 tablet 3   Multiple Vitamin (MULTIVITAMIN WITH MINERALS) TABS tablet Take 1 tablet by mouth daily. 90 tablet 1   pantoprazole (PROTONIX) 40 MG tablet Take 1 tablet (40 mg total) by mouth daily. 30 tablet 1   temazepam (RESTORIL) 15 MG capsule Take by mouth.     acetaminophen (TYLENOL) 325 MG tablet Take 2 tablets (650 mg total) by mouth every 6 (six) hours as needed for mild pain (or Fever >/= 101). (Patient not taking: Reported on 08/05/2023) 90 tablet 1   ondansetron (ZOFRAN) 8 MG tablet Take 1 tablet (8 mg total) by mouth every 8 (eight) hours as needed for nausea or vomiting. (Patient not taking: Reported on 08/10/2023) 30 tablet 1   polyethylene glycol (MIRALAX) 17 g packet Take 17 g by mouth daily. (Patient not taking: Reported on 08/29/2023) 14 each 0   prochlorperazine (COMPAZINE) 10 MG tablet Take 1 tablet (10 mg total) by mouth every 6 (six) hours as needed for nausea or vomiting. (Patient not taking: Reported on 08/10/2023) 30 tablet 1   senna (SENOKOT) 8.6 MG TABS tablet Take  2 tablets (17.2 mg total) by mouth daily. (Patient not taking: Reported on  08/10/2023) 120 tablet 0   No current facility-administered medications for this visit.   Facility-Administered Medications Ordered in Other Visits  Medication Dose Route Frequency Provider Last Rate Last Admin   bortezomib SQ (VELCADE) chemo injection (2.5mg /mL concentration) 2.5 mg  1.3 mg/m2 (Treatment Plan Recorded) Subcutaneous Once [name], MD       daratumumab-hyaluronidase-fihj [facility] FASPRO) 1800-30000 MG-UT/15ML chemo SQ injection 1,800 mg  1,800 mg Subcutaneous Once [name], MD        Review of Systems  Constitutional:  Positive for fatigue. Negative for appetite change, chills, fever and unexpected weight change.  HENT:   Negative for hearing loss and voice change.   Eyes:  Negative for eye problems and icterus.  Respiratory:  Negative for chest tightness, cough and shortness of breath.   Cardiovascular:  Positive for leg swelling. Negative for chest pain.  Gastrointestinal:  Negative for abdominal distention and abdominal pain.  Endocrine: Negative for hot flashes.  Genitourinary:  Negative for difficulty urinating, dysuria and frequency.   Musculoskeletal:  Negative for arthralgias.  Skin:  Negative for itching and rash.  Neurological:  Negative for light-headedness and numbness.  Hematological:  Negative for adenopathy. Does not bruise/bleed easily.  Psychiatric/Behavioral:  Positive for sleep disturbance. Negative for confusion.     PHYSICAL EXAMINATION: Vitals:   08/29/23 0835  BP: 121/86  Pulse: 94  Resp: 18  Temp: (!) 96 F (35.6 C)  SpO2: 100%   Filed Weights   08/29/23 0835  Weight: 181 lb 4.8 oz (82.2 kg)    Physical Exam Constitutional:      General: He is not in acute distress.    Appearance: He is obese.  HENT:     Head: Normocephalic and atraumatic.  Eyes:     General: No scleral icterus. Cardiovascular:     Rate and Rhythm: Normal rate and regular rhythm.  Pulmonary:     Effort: Pulmonary effort is normal. No respiratory distress.      Comments: Crackles at the base of lung  Abdominal:     General: Bowel sounds are normal. There is no distension.     Palpations: Abdomen is soft.  Musculoskeletal:        General: No deformity. Normal range of motion.     Cervical back: Normal range of motion and neck supple.     Right lower leg: Edema present.     Left lower leg: Edema present.  Skin:    General: Skin is warm and dry.     Findings: No erythema or rash.  Neurological:     Mental Status: He is alert and oriented to person, place, and time. Mental status is at baseline.     Cranial Nerves: No cranial nerve deficit.  Psychiatric:        Mood and Affect: Mood normal.      LABORATORY DATA:  I have reviewed the data as listed    Latest Ref Rng & Units 08/29/2023    8:00 AM 08/15/2023    8:22 AM 08/10/2023   12:28 PM  CBC  WBC 4.0 - 10.5 K/uL 5.6  8.0  13.6   Hemoglobin 13.0 - 17.0 g/dL 9.5  69.4  85.4   Hematocrit 39.0 - 52.0 % 28.7  34.2  36.6   Platelets 150 - 400 K/uL 171  127  142       Latest Ref Rng & Units 08/29/2023  8:00 AM 08/15/2023    8:22 AM 08/10/2023   12:28 PM  CMP  Glucose 70 - 99 mg/dL 528  413  244   BUN 8 - 23 mg/dL 23  22  27    Creatinine 0.61 - 1.24 mg/dL 0.10  2.72  5.36   Sodium 135 - 145 mmol/L 140  139  131   Potassium 3.5 - 5.1 mmol/L 4.4  3.9  4.5   Chloride 98 - 111 mmol/L 105  105  99   CO2 22 - 32 mmol/L 26  26  23    Calcium 8.9 - 10.3 mg/dL 8.6  8.3  8.4   Total Protein 6.5 - 8.1 g/dL 7.4  7.0  7.8   Total Bilirubin <1.2 mg/dL 0.9  0.7  0.7   Alkaline Phos 38 - 126 U/L 116  117  105   AST 15 - 41 U/L 17  16  16    ALT 0 - 44 U/L 25  24  31     Lab Results  Component Value Date   IRON 100 03/14/2023   TIBC 272 03/14/2023   IRONPCTSAT 37 03/14/2023   FERRITIN 249 03/14/2023     RADIOGRAPHIC STUDIES: I have personally reviewed the radiological images as listed and agreed with the findings in the report. No results found.

## 2023-08-29 NOTE — Assessment & Plan Note (Addendum)
Bone marrow biopsy was reviewed. 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 Cycle 3 D15 Daratumumab was held due to cytopenia and being on anticoagulation.  S/p 4 cycles  Daratumumab and Velcade with revlimid 10mg  2 weeks on 1 week off  Cycle 4 D15 missed due to hospitalization due to ileus. BM biopsy at [facility] after 4 cycles showed 50-60% plasma cells.  Labs are reviewed and discussed with patient. Discussed with [facility]. Recommend additional 3-4 cycles.  cycle 5 Dara Rvd,  Revlimid to 15mg  2 weeks on 1 week off, patient did not tolerate 15mg  due to dizziness- only took one dose and he declined additional doses. He took Revlimid 10mg  for 1 week for cycle 5.  Proceed with cycle 6 Dara Rvd  Revlimid 10mg  daily 2 weeks on 1 week off.   Continue Acyclovir, At high risk of thrombosis, on Eliquis 5mg  BID for A fib Xegeva monthly-  around 12/23, if calcium meets criteria.  Recommend calcium and vitamin D supplementation.   Follow up with [facility] in the future evaluation for BM transplant - no amyloidosis on cardiac MRI

## 2023-08-29 NOTE — Patient Instructions (Signed)
[facility] - A DEPT OF [facility]  Discharge Instructions: Thank you for choosing [facility] to provide your oncology and hematology care.  If you have a lab appointment with the Cancer Center, please go directly to the Cancer Center and check in at the registration area.  Wear comfortable clothing and clothing appropriate for easy access to any Portacath or PICC line.   We strive to give you quality time with your provider. You may need to reschedule your appointment if you arrive late (15 or more minutes).  Arriving late affects you and other patients whose appointments are after yours.  Also, if you miss three or more appointments without notifying the office, you may be dismissed from the clinic at the provider's discretion.      For prescription refill requests, have your pharmacy contact our office and allow 72 hours for refills to be completed.    Today you received the following chemotherapy and/or immunotherapy agents- valcade, darzalex      To help prevent nausea and vomiting after your treatment, we encourage you to take your nausea medication as directed.  BELOW ARE SYMPTOMS THAT SHOULD BE REPORTED IMMEDIATELY: *FEVER GREATER THAN 100.4 F (38 C) OR HIGHER *CHILLS OR SWEATING *NAUSEA AND VOMITING THAT IS NOT CONTROLLED WITH YOUR NAUSEA MEDICATION *UNUSUAL SHORTNESS OF BREATH *UNUSUAL BRUISING OR BLEEDING *URINARY PROBLEMS (pain or burning when urinating, or frequent urination) *BOWEL PROBLEMS (unusual diarrhea, constipation, pain near the anus) TENDERNESS IN MOUTH AND THROAT WITH OR WITHOUT PRESENCE OF ULCERS (sore throat, sores in mouth, or a toothache) UNUSUAL RASH, SWELLING OR PAIN  UNUSUAL VAGINAL DISCHARGE OR ITCHING   Items with * indicate a potential emergency and should be followed up as soon as possible or go to the Emergency Department if any problems should occur.  Please show the CHEMOTHERAPY ALERT CARD or  IMMUNOTHERAPY ALERT CARD at check-in to the Emergency Department and triage nurse.  Should you have questions after your visit or need to cancel or reschedule your appointment, please contact [facility] - A DEPT OF [facility]  [phone] and follow the prompts.  Office hours are 8:00 a.m. to 4:30 p.m. Monday - Friday. Please note that voicemails left after 4:00 p.m. may not be returned until the following business day.  We are closed weekends and major holidays. You have access to a nurse at all times for urgent questions. Please call the main number to the clinic [phone] and follow the prompts.  For any non-urgent questions, you may also contact your provider using MyChart. We now offer e-Visits for anyone 50 and older to request care online for non-urgent symptoms. For details visit [email or web address].   Also download the MyChart app! Go to the app store, search "MyChart", open the app, select [place], and log in with your MyChart username and password.

## 2023-08-29 NOTE — Assessment & Plan Note (Signed)
No DVT on US Echo LVEF 50% -55%. He has cardiology appt  Recommend Lasix 20mg  daily PRN for edema

## 2023-08-29 NOTE — Telephone Encounter (Signed)
Pt of Victor Phillips requesting refills

## 2023-08-29 NOTE — Assessment & Plan Note (Signed)
 Recommend patient to take calcium 1200mg  daily. Take vitamin D supplementation.

## 2023-08-29 NOTE — Assessment & Plan Note (Signed)
Encourage oral hydration and avoid nephrotoxins.   

## 2023-08-29 NOTE — Assessment & Plan Note (Signed)
 Echo was done on 06/14/23 LVEF 50% Follow up with cardiology for evaluation.  Continue Eliquis 5mg  BID

## 2023-08-29 NOTE — Assessment & Plan Note (Signed)
Chemotherapy induced. Close monitor 

## 2023-08-29 NOTE — Telephone Encounter (Signed)
*  STAT* If patient is at the pharmacy, call can be transferred to refill team.   1. Which medications need to be refilled? (please list name of each medication and dose if known)   furosemide (LASIX) 20 MG tablet   2. Would you like to learn more about the convenience, safety, & potential cost savings by using the [facility]?   3. Are you open to using the [facility] (Type [facility]. ).  4. Which pharmacy/location (including street and city if local pharmacy) is medication to be sent to?  [id number] -  (N), [place] - [address]   5. Do they need a 30 day or 90 day supply?   90 day  Patient stated he is completely out of this medication.

## 2023-08-29 NOTE — Assessment & Plan Note (Signed)
 Chemotherapy plan as listed above. - hold treatment for now due to recent Ileus. pending decision of BM transplant

## 2023-08-29 NOTE — Assessment & Plan Note (Signed)
Anemia due to chemotherapy. There maybe a component of anemia of CKD.  Monitor.

## 2023-08-29 NOTE — Telephone Encounter (Addendum)
Patient called asking if Dr Cathie Hoops spoke with Cardiology regarding his fluid pills as he is in need of refill

## 2023-08-30 LAB — KAPPA/LAMBDA LIGHT CHAINS
Kappa free light chain: 13.5 mg/L (ref 3.3–19.4)
Kappa, lambda light chain ratio: 0.21 — ABNORMAL LOW (ref 0.26–1.65)
Lambda free light chains: 64.6 mg/L — ABNORMAL HIGH (ref 5.7–26.3)

## 2023-09-01 ENCOUNTER — Inpatient Hospital Stay: Payer: Medicare HMO

## 2023-09-01 VITALS — BP 123/78 | HR 83 | Temp 96.1°F | Resp 18 | Wt 180.8 lb

## 2023-09-01 DIAGNOSIS — C9000 Multiple myeloma not having achieved remission: Secondary | ICD-10-CM

## 2023-09-01 DIAGNOSIS — Z5112 Encounter for antineoplastic immunotherapy: Secondary | ICD-10-CM | POA: Diagnosis not present

## 2023-09-01 MED ORDER — PROCHLORPERAZINE MALEATE 10 MG PO TABS
10.0000 mg | ORAL_TABLET | Freq: Once | ORAL | Status: AC
  Administered 2023-09-01: 10 mg via ORAL
  Filled 2023-09-01: qty 1

## 2023-09-01 MED ORDER — BORTEZOMIB CHEMO SQ INJECTION 3.5 MG (2.5MG/ML)
1.3000 mg/m2 | Freq: Once | INTRAMUSCULAR | Status: AC
Start: 2023-09-01 — End: 2023-09-01
  Administered 2023-09-01: 2.5 mg via SUBCUTANEOUS
  Filled 2023-09-01: qty 1

## 2023-09-01 NOTE — Progress Notes (Signed)
Nutrition Follow-up:  Patient with multiple myeloma.  Patient receiving daratumumab-hyaluronidase-fihj, bortezomib.    Met with patient and wife during infusion.  Reports that appetite is good.  Usually eats breakfast and supper and light lunch.  Denies nutrition impact symptoms.  Wife reports that he drinks ensure max protein shakes sometimes    Medications: reviewed  Labs: glucose 181  Anthropometrics:   Weight 181 lb on 12/16  167 lb on 11/27 176 lb on 10/18 190 lb on 9/20  (? Fluid)  NUTRITION DIAGNOSIS: none at this time   INTERVENTION:  Encouraged well balanced diet including variety of plant foods and lean protein.   Continue low sugar oral nutrition supplement    MONITORING, EVALUATION, GOAL: weight trends, intake   NEXT VISIT: Thursday, Jan 16 during infusion  Venicia Vandall B. Freida Busman, RD, LDN Registered Dietitian [phone]

## 2023-09-01 NOTE — Patient Instructions (Signed)
 [facility] - A DEPT OF [facility]  Discharge Instructions: Thank you for choosing [facility] to provide your oncology and hematology care.  If you have a lab appointment with the Cancer Center, please go directly to the Cancer Center and check in at the registration area.  Wear comfortable clothing and clothing appropriate for easy access to any Portacath or PICC line.   We strive to give you quality time with your provider. You may need to reschedule your appointment if you arrive late (15 or more minutes).  Arriving late affects you and other patients whose appointments are after yours.  Also, if you miss three or more appointments without notifying the office, you may be dismissed from the clinic at the provider's discretion.      For prescription refill requests, have your pharmacy contact our office and allow 72 hours for refills to be completed.    Today you received the following chemotherapy and/or immunotherapy agents Velcade       To help prevent nausea and vomiting after your treatment, we encourage you to take your nausea medication as directed.  BELOW ARE SYMPTOMS THAT SHOULD BE REPORTED IMMEDIATELY: *FEVER GREATER THAN 100.4 F (38 C) OR HIGHER *CHILLS OR SWEATING *NAUSEA AND VOMITING THAT IS NOT CONTROLLED WITH YOUR NAUSEA MEDICATION *UNUSUAL SHORTNESS OF BREATH *UNUSUAL BRUISING OR BLEEDING *URINARY PROBLEMS (pain or burning when urinating, or frequent urination) *BOWEL PROBLEMS (unusual diarrhea, constipation, pain near the anus) TENDERNESS IN MOUTH AND THROAT WITH OR WITHOUT PRESENCE OF ULCERS (sore throat, sores in mouth, or a toothache) UNUSUAL RASH, SWELLING OR PAIN  UNUSUAL VAGINAL DISCHARGE OR ITCHING   Items with * indicate a potential emergency and should be followed up as soon as possible or go to the Emergency Department if any problems should occur.  Please show the CHEMOTHERAPY ALERT CARD or IMMUNOTHERAPY  ALERT CARD at check-in to the Emergency Department and triage nurse.  Should you have questions after your visit or need to cancel or reschedule your appointment, please contact [facility] - A DEPT OF [facility]igh[facility]  [phone] and follow the prompts.  Office hours are 8:00 a.m. to 4:30 p.m. Monday - Friday. Please note that voicemails left after 4:00 p.m. may not be returned until the following business day.  We are closed weekends and major holidays. You have access to a nurse at all times for urgent questions. Please call the main number to the clinic [phone] and follow the prompts.  For any non-urgent questions, you may also contact your provider using MyChart. We now offer e-Visits for anyone 3 and older to request care online for non-urgent symptoms. For details visit [email or web address].   Also download the MyChart app! Go to the app store, search "MyChart", open the app, select , and log in with your MyChart username and password.

## 2023-09-05 ENCOUNTER — Inpatient Hospital Stay: Payer: Medicare HMO

## 2023-09-05 VITALS — BP 130/71 | HR 60 | Temp 96.3°F | Resp 18

## 2023-09-05 DIAGNOSIS — C9000 Multiple myeloma not having achieved remission: Secondary | ICD-10-CM

## 2023-09-05 DIAGNOSIS — Z5112 Encounter for antineoplastic immunotherapy: Secondary | ICD-10-CM | POA: Diagnosis not present

## 2023-09-05 LAB — CBC WITH DIFFERENTIAL (CANCER CENTER ONLY)
Abs Immature Granulocytes: 0.06 10*3/uL (ref 0.00–0.07)
Basophils Absolute: 0 10*3/uL (ref 0.0–0.1)
Basophils Relative: 0 %
Eosinophils Absolute: 0.1 10*3/uL (ref 0.0–0.5)
Eosinophils Relative: 3 %
HCT: 28.9 % — ABNORMAL LOW (ref 39.0–52.0)
Hemoglobin: 9.6 g/dL — ABNORMAL LOW (ref 13.0–17.0)
Immature Granulocytes: 1 %
Lymphocytes Relative: 10 %
Lymphs Abs: 0.6 10*3/uL — ABNORMAL LOW (ref 0.7–4.0)
MCH: 33.2 pg (ref 26.0–34.0)
MCHC: 33.2 g/dL (ref 30.0–36.0)
MCV: 100 fL (ref 80.0–100.0)
Monocytes Absolute: 0.4 10*3/uL (ref 0.1–1.0)
Monocytes Relative: 7 %
Neutro Abs: 4.4 10*3/uL (ref 1.7–7.7)
Neutrophils Relative %: 79 %
Platelet Count: 123 10*3/uL — ABNORMAL LOW (ref 150–400)
RBC: 2.89 MIL/uL — ABNORMAL LOW (ref 4.22–5.81)
RDW: 14.6 % (ref 11.5–15.5)
WBC Count: 5.6 10*3/uL (ref 4.0–10.5)
nRBC: 0 % (ref 0.0–0.2)

## 2023-09-05 LAB — CMP (CANCER CENTER ONLY)
ALT: 28 U/L (ref 0–44)
AST: 18 U/L (ref 15–41)
Albumin: 3.6 g/dL (ref 3.5–5.0)
Alkaline Phosphatase: 74 U/L (ref 38–126)
Anion gap: 9 (ref 5–15)
BUN: 21 mg/dL (ref 8–23)
CO2: 28 mmol/L (ref 22–32)
Calcium: 8.8 mg/dL — ABNORMAL LOW (ref 8.9–10.3)
Chloride: 99 mmol/L (ref 98–111)
Creatinine: 1.02 mg/dL (ref 0.61–1.24)
GFR, Estimated: >60 mL/min (ref >60)
Glucose, Bld: 126 mg/dL — ABNORMAL HIGH (ref 70–99)
Potassium: 3.9 mmol/L (ref 3.5–5.1)
Sodium: 136 mmol/L (ref 135–145)
Total Bilirubin: 0.6 mg/dL (ref <1.2)
Total Protein: 6.9 g/dL (ref 6.5–8.1)

## 2023-09-05 MED ORDER — DEXAMETHASONE 4 MG PO TABS
20.0000 mg | ORAL_TABLET | Freq: Once | ORAL | Status: AC
Start: 2023-09-05 — End: 2023-09-05
  Administered 2023-09-05: 20 mg via ORAL
  Filled 2023-09-05: qty 5

## 2023-09-05 MED ORDER — ACETAMINOPHEN 325 MG PO TABS
650.0000 mg | ORAL_TABLET | Freq: Once | ORAL | Status: AC
  Administered 2023-09-05: 650 mg via ORAL
  Filled 2023-09-05: qty 2

## 2023-09-05 MED ORDER — DIPHENHYDRAMINE HCL 25 MG PO CAPS
50.0000 mg | ORAL_CAPSULE | Freq: Once | ORAL | Status: AC
  Administered 2023-09-05: 50 mg via ORAL
  Filled 2023-09-05: qty 2

## 2023-09-05 MED ORDER — DARATUMUMAB-HYALURONIDASE-FIHJ 1800-30000 MG-UT/15ML ~~LOC~~ SOLN
1800.0000 mg | Freq: Once | SUBCUTANEOUS | Status: AC
Start: 2023-09-05 — End: 2023-09-05
  Administered 2023-09-05: 1800 mg via SUBCUTANEOUS
  Filled 2023-09-05: qty 15

## 2023-09-05 MED ORDER — DENOSUMAB 120 MG/1.7ML ~~LOC~~ SOLN
120.0000 mg | Freq: Once | SUBCUTANEOUS | Status: AC
  Administered 2023-09-05: 120 mg via SUBCUTANEOUS
  Filled 2023-09-05: qty 1.7

## 2023-09-05 MED ORDER — BORTEZOMIB CHEMO SQ INJECTION 3.5 MG (2.5MG/ML)
1.3000 mg/m2 | Freq: Once | INTRAMUSCULAR | Status: AC
Start: 2023-09-05 — End: 2023-09-05
  Administered 2023-09-05: 2.5 mg via SUBCUTANEOUS
  Filled 2023-09-05: qty 1

## 2023-09-07 LAB — MULTIPLE MYELOMA PANEL, SERUM
Albumin SerPl Elph-Mcnc: 3.5 g/dL (ref 2.9–4.4)
Albumin/Glob SerPl: 1.1 (ref 0.7–1.7)
Alpha 1: 0.2 g/dL (ref 0.0–0.4)
Alpha2 Glob SerPl Elph-Mcnc: 0.8 g/dL (ref 0.4–1.0)
B-Globulin SerPl Elph-Mcnc: 0.8 g/dL (ref 0.7–1.3)
Gamma Glob SerPl Elph-Mcnc: 1.5 g/dL (ref 0.4–1.8)
Globulin, Total: 3.3 g/dL (ref 2.2–3.9)
IgA: 2001 mg/dL — ABNORMAL HIGH (ref 61–437)
IgG (Immunoglobin G), Serum: 209 mg/dL — ABNORMAL LOW (ref 603–1613)
IgM (Immunoglobulin M), Srm: <5 mg/dL — ABNORMAL LOW (ref 20–172)
M Protein SerPl Elph-Mcnc: 1.2 g/dL — ABNORMAL HIGH
Total Protein ELP: 6.8 g/dL (ref 6.0–8.5)

## 2023-09-08 ENCOUNTER — Inpatient Hospital Stay: Payer: Medicare HMO | Admitting: Internal Medicine

## 2023-09-08 ENCOUNTER — Other Ambulatory Visit: Payer: Self-pay

## 2023-09-08 ENCOUNTER — Inpatient Hospital Stay: Payer: Medicare HMO

## 2023-09-08 VITALS — BP 123/78 | HR 99 | Temp 95.9°F | Resp 18 | Wt 176.4 lb

## 2023-09-08 DIAGNOSIS — C9000 Multiple myeloma not having achieved remission: Secondary | ICD-10-CM

## 2023-09-08 DIAGNOSIS — Z5112 Encounter for antineoplastic immunotherapy: Secondary | ICD-10-CM | POA: Diagnosis not present

## 2023-09-08 DIAGNOSIS — D696 Thrombocytopenia, unspecified: Secondary | ICD-10-CM | POA: Diagnosis not present

## 2023-09-08 DIAGNOSIS — Z5111 Encounter for antineoplastic chemotherapy: Secondary | ICD-10-CM

## 2023-09-08 LAB — COMPREHENSIVE METABOLIC PANEL
ALT: 34 U/L (ref 0–44)
AST: 14 U/L — ABNORMAL LOW (ref 15–41)
Albumin: 3.3 g/dL — ABNORMAL LOW (ref 3.5–5.0)
Alkaline Phosphatase: 64 U/L (ref 38–126)
Anion gap: 11 (ref 5–15)
BUN: 30 mg/dL — ABNORMAL HIGH (ref 8–23)
CO2: 24 mmol/L (ref 22–32)
Calcium: 7.8 mg/dL — ABNORMAL LOW (ref 8.9–10.3)
Chloride: 100 mmol/L (ref 98–111)
Creatinine, Ser: 1.03 mg/dL (ref 0.61–1.24)
GFR, Estimated: >60 mL/min (ref >60)
Glucose, Bld: 233 mg/dL — ABNORMAL HIGH (ref 70–99)
Potassium: 3.5 mmol/L (ref 3.5–5.1)
Sodium: 135 mmol/L (ref 135–145)
Total Bilirubin: 0.9 mg/dL (ref <1.2)
Total Protein: 6.2 g/dL — ABNORMAL LOW (ref 6.5–8.1)

## 2023-09-08 LAB — CBC WITH DIFFERENTIAL/PLATELET
Abs Immature Granulocytes: 0.04 10*3/uL (ref 0.00–0.07)
Basophils Absolute: 0 10*3/uL (ref 0.0–0.1)
Basophils Relative: 0 %
Eosinophils Absolute: 0.1 10*3/uL (ref 0.0–0.5)
Eosinophils Relative: 3 %
HCT: 26.9 % — ABNORMAL LOW (ref 39.0–52.0)
Hemoglobin: 9.1 g/dL — ABNORMAL LOW (ref 13.0–17.0)
Immature Granulocytes: 1 %
Lymphocytes Relative: 4 %
Lymphs Abs: 0.1 10*3/uL — ABNORMAL LOW (ref 0.7–4.0)
MCH: 33.2 pg (ref 26.0–34.0)
MCHC: 33.8 g/dL (ref 30.0–36.0)
MCV: 98.2 fL (ref 80.0–100.0)
Monocytes Absolute: 0.2 10*3/uL (ref 0.1–1.0)
Monocytes Relative: 6 %
Neutro Abs: 3.5 10*3/uL (ref 1.7–7.7)
Neutrophils Relative %: 86 %
Platelets: 60 10*3/uL — ABNORMAL LOW (ref 150–400)
RBC: 2.74 MIL/uL — ABNORMAL LOW (ref 4.22–5.81)
RDW: 14.6 % (ref 11.5–15.5)
WBC: 4 10*3/uL (ref 4.0–10.5)
nRBC: 0 % (ref 0.0–0.2)

## 2023-09-08 MED ORDER — PROCHLORPERAZINE MALEATE 10 MG PO TABS
10.0000 mg | ORAL_TABLET | Freq: Once | ORAL | Status: AC
  Administered 2023-09-08: 10 mg via ORAL

## 2023-09-08 MED ORDER — BORTEZOMIB CHEMO SQ INJECTION 3.5 MG (2.5MG/ML)
1.3000 mg/m2 | Freq: Once | INTRAMUSCULAR | Status: AC
Start: 2023-09-08 — End: 2023-09-08
  Administered 2023-09-08: 2.5 mg via SUBCUTANEOUS
  Filled 2023-09-08: qty 1

## 2023-09-08 NOTE — Addendum Note (Signed)
Addended byMichaelyn Barter on: 09/08/2023 03:25 PM   Modules accepted: Orders

## 2023-09-08 NOTE — Progress Notes (Signed)
Hematology/Oncology Progress note [phone] [phone]         Patient Care Team: Danella Penton, MD as PCP - General (Internal Medicine) Debbe Odea, MD as PCP - Cardiology (Cardiology) [name], MD as Consulting Physician (Oncology)  CHIEF COMPLAINTS/REASON FOR VISIT:  Multiple myeloma   ASSESSMENT & PLAN:   Cancer Staging  Multiple myeloma [facility]) Staging form: Plasma Cell Myeloma and Plasma Cell Disorders, AJCC 8th Edition - Clinical stage from 04/14/2023: RISS Stage II (Beta-2-microglobulin (mg/L): 8.4, Albumin (g/dL): 2.6, ISS: Stage III, High-risk cytogenetics: Absent, LDH: Normal) - Signed by [name], MD on 04/22/2023   Multiple myeloma (HCC) Bone marrow biopsy was reviewed. 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 Cycle 3 D15 Daratumumab was held due to cytopenia and being on anticoagulation.  S/p 4 cycles  Daratumumab and Velcade with revlimid 10mg  2 weeks on 1 week off  Cycle 4 D15 missed due to hospitalization due to ileus. BM biopsy at [facility] after 4 cycles showed 50-60% plasma cells.  Labs are reviewed and discussed with patient. Discussed with [facility]. Recommend additional 3-4 cycles.  cycle 5 Dara Rvd,  Revlimid to 15mg  2 weeks on 1 week off, patient did not tolerate 15mg  due to dizziness- only took one dose and he declined additional doses. He took Revlimid 10mg  for 1 week for cycle 5.  Proceed with cycle 6 Dara Rvd  Revlimid 10mg  daily 2 weeks on 1 week off. Continue Acyclovir, At high risk of thrombosis, on Eliquis 5mg  BID for A fib Xegeva monthly- hold  due to hypocalcemia.  Recommend calcium and vitamin D supplementation.    Follow up with [facility] in the future evaluation for BM transplant - no amyloidosis on cardiac MRI   Patient came to the clinic today for cycle 6-day 11 of  Velcade.  He had episodes of diarrhea overnight requiring Imodium use.  Denies any fevers, chills.  Diarrhea has resolved around 10:30 AM today.  He feels little weak but overall fine.  Vitals are stable.  Labs were rechecked.  Platelets are 60 down from 123.  He has history of thrombocytopenia in the past.  Since his platelets are still above 50,000, we will go ahead with Velcade today.  He is on Revlimid 10 mg which he will finish on Monday.  Repeat platelets on Monday.  Atrial fibrillation (HCC) Echo was done on 06/14/23 LVEF 50% Follow up with cardiology for evaluation.  Continue Eliquis 5mg  BID   CKD (chronic kidney disease) stage 3, GFR 30-59 ml/min (HCC) Encourage oral hydration and avoid nephrotoxins.      Encounter for antineoplastic chemotherapy Chemotherapy plan as listed above. - hold treatment for now due to recent Ileus. pending decision of BM transplant    Thrombocytopenia (HCC) Chemotherapy induced. Close monitor   Hypocalcemia Recommend patient to take calcium 1200mg  daily. Take vitamin D supplementation.    Bilateral lower extremity edema No DVT on US Echo LVEF 50% -55%. He has cardiology appt  Recommend Lasix 20mg  daily PRN for edema   Anemia due to antineoplastic chemotherapy Anemia due to chemotherapy. There maybe a component of anemia of CKD.  Monitor.     Orders Placed This Encounter  Procedures   CBC with Differential/Platelet    Standing Status:   Future    Expiration Date:   09/07/2024  Comprehensive metabolic panel    Standing Status:   Future    Expiration Date:   09/07/2024   Follow-up per LOS  We spent sufficient time to discuss many aspect of care, questions were answered to patient's satisfaction.        HISTORY OF PRESENTING ILLNESS:  Victor Phillips is a  67 y.o.  male with PMH listed below who was referred to me for anemia  02/11/2023 - 02/14/2023 recent hospitalization due to pneumonia, respiratory failure.  He was found to have a  hemoglobin decreased at 6.8, status post PRBC transfusion during admission.  EGD showed gastritis.  Colonoscopy was not remarkable. 02/11/2023 TIBC 221 ferritin 107, iron saturation 16. Patient is currently taking fusion plus Vitamin B12 level in the 300s. His echo showed grade 2 diastolic CHF.  He denies recent chest pain on exertion, shortness of breath on minimal exertion, pre-syncopal episodes, or palpitations He had not noticed any recent bleeding such as epistaxis, hematuria or hematochezia.  He denies over the counter NSAID ingestion.  Oncology History  Multiple myeloma (HCC)  04/14/2023 Initial Diagnosis   Multiple myeloma   03/13/2020 multiple myeloma panel showed M protein 4.3, IgA lambda Free lamda Level 142,-light chain ratio 0.07 04/05/2023 bone marrow biopsy showed hypercellular bone marrow with plasma cell neoplasm.The bone marrow is hypercellular for age with prominent increase in plasma cells representing 65% of all cells in the aspirate associated with interstitial infiltrates and numerous variably sized aggregates in  the clot and biopsy sections.  The plasma cells display lambda light chain restriction consistent with plasma cell neoplasm.  Normal cytogenetics.  FISH studies pending.    04/14/2023 Cancer Staging   Staging form: Plasma Cell Myeloma and Plasma Cell Disorders, AJCC 8th Edition - Clinical stage from 04/14/2023: RISS Stage II (Beta-2-microglobulin (mg/L): 8.4, Albumin (g/dL): 2.6, ISS: Stage III, High-risk cytogenetics: Absent, LDH: Normal) - Signed by [name], MD on 04/22/2023 Stage prefix: Initial diagnosis Beta 2 microglobulin range (mg/L): Greater than or equal to 5.5 Albumin range (g/dL): Less than 3.5 Cytogenetics: 1q addition, Other mutation   04/14/2023 Imaging   Skeletal survey 1. No lytic lesions or other intrinsic bony abnormality. 2. Borderline cardiomegaly with an interval decrease in size. 3. Diffuse atheromatous arterial calcifications including  bilateral carotid artery calcifications, right greater than left   04/22/2023 -  Chemotherapy   Patient is on Treatment Plan : MYELOMA NEWLY DIAGNOSED TRANSPLANT CANDIDATE DaraVRd (Daratumumab SQ) q21d     04/25/2023 Imaging   PET scan showed  1. Two tiny hypermetabolic lucent lesions in the left posteromedial eighth and ninth ribs. No additional evidence of multiple myeloma. 2. Aortic atherosclerosis (ICD10-I70.0). Coronary artery calcification.   07/14/2023 - 07/19/2023 Hospital Admission   Hospitalized due to ileus.    07/25/2023 Bone Marrow Biopsy   A-C. Peripheral blood and bone marrow, (peripheral smear, aspirate smear, touch preparation, clot section, core biopsy):   Persistent plasma cell myeloma, 50-60% lambda restricted plasma cells on core biopsy. Hypercellular bone marrow (70%) with trilineage hematopoiesis. Negative for increased blasts. Negative for significant dysplasia. Negative for amyloid deposition.     Patient presented today for his Velcade treatment.  Overnight, he had multiple episodes of diarrhea lasting until 10:30 AM this morning.  He took 2 doses of Imodium.  Denies any fever, chills.  Feels little weak but keeping up with his hydration orally.  Denies any shortness of breath, chest pain.  Denies any bleeding.  MEDICAL HISTORY:  Past Medical History:  Diagnosis  Date   Atrial fibrillation (HCC)    Diabetes mellitus without complication (HCC)    Hypercholesteremia    Hypertension    Multiple myeloma not having achieved remission [facility])     SURGICAL HISTORY: Past Surgical History:  Procedure Laterality Date   COLONOSCOPY N/A 02/13/2023   Procedure: COLONOSCOPY;  Surgeon: Toledo, Boykin Nearing, MD;  Location: [facility] ENDOSCOPY;  Service: Gastroenterology;  Laterality: N/A;   COLONOSCOPY N/A 02/14/2023   Procedure: COLONOSCOPY;  Surgeon: Toledo, Boykin Nearing, MD;  Location: [facility] ENDOSCOPY;  Service: Gastroenterology;  Laterality: N/A;   ESOPHAGOGASTRODUODENOSCOPY N/A  02/13/2023   Procedure: ESOPHAGOGASTRODUODENOSCOPY (EGD);  Surgeon: Toledo, Boykin Nearing, MD;  Location: [facility] ENDOSCOPY;  Service: Gastroenterology;  Laterality: N/A;    SOCIAL HISTORY: Social History   Socioeconomic History   Marital status: Married    Spouse name: Not on file   Number of children: Not on file   Years of education: Not on file   Highest education level: Not on file  Occupational History   Not on file  Tobacco Use   Smoking status: Never   Smokeless tobacco: Never  Vaping Use   Vaping status: Never Used  Substance and Sexual Activity   Alcohol use: Not Currently    Comment: quit approx 2001   Drug use: No   Sexual activity: Not on file  Other Topics Concern   Not on file  Social History Narrative   Not on file   Social Drivers of Health   Financial Resource Strain: Low Risk  (07/22/2023)   Received from [facility]   Overall Financial Resource Strain (CARDIA)    Difficulty of Paying Living Expenses: Not hard at all  Recent Concern: Financial Resource Strain - High Risk (07/05/2023)   Received from [facility]   Overall Financial Resource Strain (CARDIA)    Difficulty of Paying Living Expenses: Hard  Food Insecurity: No Food Insecurity (07/22/2023)   Received from [facility]   Hunger Vital Sign    Worried About Running Out of Food in the Last Year: Never true    Ran Out of Food in the Last Year: Never true  Recent Concern: Food Insecurity - Food Insecurity Present (07/05/2023)   Received from [facility]   Hunger Vital Sign    Worried About Running Out of Food in the Last Year: Sometimes true    Ran Out of Food in the Last Year: Sometimes true  Transportation Needs: No Transportation Needs (07/22/2023)   Received from [facility] - Transportation    In the past 12 months, has lack of transportation kept you from medical appointments or from getting  medications?: No    Lack of Transportation (Non-Medical): No  Physical Activity: Sufficiently Active (09/24/2020)   Received from [facility], [facility]   Exercise Vital Sign    Days of Exercise per Week: 6 days    Minutes of Exercise per Session: 30 min  Stress: No Stress Concern Present (09/24/2020)   Received from [facility], [facility]   [employer] of Occupational Health - Occupational Stress Questionnaire    Feeling of Stress : Not at all  Social Connections: Socially Integrated (09/24/2020)   Received from [facility], [facility]   Social Connection and Isolation Panel [NHANES]    Frequency of Communication with Friends and Family: More than three times a week  Frequency of Social Gatherings with Friends and Family: Once a week    Attends Religious Services: More than 4 times per year    Active Member of [employer] or Organizations: Yes    Attends Engineer, structural: More than 4 times per year    Marital Status: Married  Catering manager Violence: Not At Risk (07/14/2023)   Humiliation, Afraid, Rape, and Kick questionnaire    Fear of Current or Ex-Partner: No    Emotionally Abused: No    Physically Abused: No    Sexually Abused: No    FAMILY HISTORY: Family History  Problem Relation Age of Onset   Diabetes Mother    Heart attack Father     ALLERGIES:  has no known allergies.  MEDICATIONS:  Current Outpatient Medications  Medication Sig Dispense Refill   acetaminophen (TYLENOL) 325 MG tablet Take 2 tablets (650 mg total) by mouth every 6 (six) hours as needed for mild pain (or Fever >/= 101). (Patient not taking: Reported on 08/05/2023) 90 tablet 1   acyclovir (ZOVIRAX) 400 MG tablet Take 1 tablet (400 mg total) by mouth 2 (two) times daily. 60 tablet 5   apixaban (ELIQUIS) 5 MG TABS tablet Take 1 tablet (5 mg total) by mouth 2 (two) times daily.  180 tablet 0   atorvastatin (LIPITOR) 10 MG tablet Take 1 tablet by mouth daily.     calcium carbonate (OS-CAL) 600 MG TABS tablet Take 2 tablets (1,200 mg total) by mouth daily.     cholecalciferol (VITAMIN D3) 25 MCG (1000 UNIT) tablet Take 1 tablet (1,000 Units total) by mouth daily.     furosemide (LASIX) 20 MG tablet Take 1 tablet (20 mg total) by mouth daily as needed for edema or fluid. 90 tablet 0   glipiZIDE (GLUCOTROL XL) 2.5 MG 24 hr tablet Take by mouth.     lenalidomide (REVLIMID) 10 MG capsule Take 1 capsule (10 mg total) by mouth daily. Take 1 capsule (10 mg total) by mouth daily. Take for 14 days, then hold for 7 days off. Repeat every 21 days. 14 capsule 0   metoprolol succinate (TOPROL-XL) 50 MG 24 hr tablet Take 1 tablet (50 mg total) by mouth daily. Take with or immediately following a meal. 90 tablet 3   Multiple Vitamin (MULTIVITAMIN WITH MINERALS) TABS tablet Take 1 tablet by mouth daily. 90 tablet 1   ondansetron (ZOFRAN) 8 MG tablet Take 1 tablet (8 mg total) by mouth every 8 (eight) hours as needed for nausea or vomiting. (Patient not taking: Reported on 08/10/2023) 30 tablet 1   pantoprazole (PROTONIX) 40 MG tablet Take 1 tablet (40 mg total) by mouth daily. 30 tablet 1   polyethylene glycol (MIRALAX) 17 g packet Take 17 g by mouth daily. (Patient not taking: Reported on 08/29/2023) 14 each 0   prochlorperazine (COMPAZINE) 10 MG tablet Take 1 tablet (10 mg total) by mouth every 6 (six) hours as needed for nausea or vomiting. (Patient not taking: Reported on 08/10/2023) 30 tablet 1   senna (SENOKOT) 8.6 MG TABS tablet Take 2 tablets (17.2 mg total) by mouth daily. (Patient not taking: Reported on 08/10/2023) 120 tablet 0   temazepam (RESTORIL) 15 MG capsule Take by mouth.     No current facility-administered medications for this visit.    Review of Systems  Constitutional:  Positive for fatigue. Negative for appetite change, chills, fever and unexpected weight change.   HENT:   Negative for hearing loss and voice change.  Eyes:  Negative for eye problems and icterus.  Respiratory:  Negative for chest tightness, cough and shortness of breath.   Cardiovascular:  Positive for leg swelling. Negative for chest pain.  Gastrointestinal:  Negative for abdominal distention and abdominal pain.  Endocrine: Negative for hot flashes.  Genitourinary:  Negative for difficulty urinating, dysuria and frequency.   Musculoskeletal:  Negative for arthralgias.  Skin:  Negative for itching and rash.  Neurological:  Negative for light-headedness and numbness.  Hematological:  Negative for adenopathy. Does not bruise/bleed easily.  Psychiatric/Behavioral:  Positive for sleep disturbance. Negative for confusion.     PHYSICAL EXAMINATION: There were no vitals filed for this visit.  There were no vitals filed for this visit.   Physical Exam Constitutional:      General: He is not in acute distress.    Appearance: He is obese.  HENT:     Head: Normocephalic and atraumatic.  Eyes:     General: No scleral icterus. Cardiovascular:     Rate and Rhythm: Normal rate and regular rhythm.  Pulmonary:     Effort: Pulmonary effort is normal. No respiratory distress.     Comments: Crackles at the base of lung  Abdominal:     General: Bowel sounds are normal. There is no distension.     Palpations: Abdomen is soft.  Musculoskeletal:        General: No deformity. Normal range of motion.     Cervical back: Normal range of motion and neck supple.     Right lower leg: Edema present.     Left lower leg: Edema present.  Skin:    General: Skin is warm and dry.     Findings: No erythema or rash.  Neurological:     Mental Status: He is alert and oriented to person, place, and time. Mental status is at baseline.     Cranial Nerves: No cranial nerve deficit.  Psychiatric:        Mood and Affect: Mood normal.     LABORATORY DATA:  I have reviewed the data as listed    Latest  Ref Rng & Units 09/05/2023    1:03 PM 08/29/2023    8:00 AM 08/15/2023    8:22 AM  CBC  WBC 4.0 - 10.5 K/uL 5.6  5.6  8.0   Hemoglobin 13.0 - 17.0 g/dL 9.6  9.5  65.7   Hematocrit 39.0 - 52.0 % 28.9  28.7  34.2   Platelets 150 - 400 K/uL 123  171  127       Latest Ref Rng & Units 09/05/2023    1:02 PM 08/29/2023    8:00 AM 08/15/2023    8:22 AM  CMP  Glucose 70 - 99 mg/dL 846  962  952   BUN 8 - 23 mg/dL 21  23  22    Creatinine 0.61 - 1.24 mg/dL 8.41  3.24  4.01   Sodium 135 - 145 mmol/L 136  140  139   Potassium 3.5 - 5.1 mmol/L 3.9  4.4  3.9   Chloride 98 - 111 mmol/L 99  105  105   CO2 22 - 32 mmol/L 28  26  26    Calcium 8.9 - 10.3 mg/dL 8.8  8.6  8.3   Total Protein 6.5 - 8.1 g/dL 6.9  7.4  7.0   Total Bilirubin <1.2 mg/dL 0.6  0.9  0.7   Alkaline Phos 38 - 126 U/L 74  116  117   AST 15 -  41 U/L 18  17  16    ALT 0 - 44 U/L 28  25  24     Lab Results  Component Value Date   IRON 100 03/14/2023   TIBC 272 03/14/2023   IRONPCTSAT 37 03/14/2023   FERRITIN 249 03/14/2023     RADIOGRAPHIC STUDIES: I have personally reviewed the radiological images as listed and agreed with the findings in the report. No results found.

## 2023-09-09 ENCOUNTER — Telehealth: Payer: Self-pay | Admitting: *Deleted

## 2023-09-09 NOTE — Telephone Encounter (Signed)
Patient called to report that was instructed to call if he continued to have diarrhea. He still have multiple loose stools.

## 2023-09-12 ENCOUNTER — Inpatient Hospital Stay: Payer: Medicare HMO

## 2023-09-12 ENCOUNTER — Encounter: Payer: Self-pay | Admitting: Oncology

## 2023-09-12 DIAGNOSIS — C9000 Multiple myeloma not having achieved remission: Secondary | ICD-10-CM

## 2023-09-12 DIAGNOSIS — D696 Thrombocytopenia, unspecified: Secondary | ICD-10-CM

## 2023-09-12 DIAGNOSIS — Z5112 Encounter for antineoplastic immunotherapy: Secondary | ICD-10-CM | POA: Diagnosis not present

## 2023-09-12 DIAGNOSIS — Z5111 Encounter for antineoplastic chemotherapy: Secondary | ICD-10-CM

## 2023-09-12 LAB — CBC WITH DIFFERENTIAL/PLATELET
Abs Immature Granulocytes: 0.08 10*3/uL — ABNORMAL HIGH (ref 0.00–0.07)
Basophils Absolute: 0 10*3/uL (ref 0.0–0.1)
Basophils Relative: 0 %
Eosinophils Absolute: 0.2 10*3/uL (ref 0.0–0.5)
Eosinophils Relative: 3 %
HCT: 27.2 % — ABNORMAL LOW (ref 39.0–52.0)
Hemoglobin: 9.2 g/dL — ABNORMAL LOW (ref 13.0–17.0)
Immature Granulocytes: 1 %
Lymphocytes Relative: 5 %
Lymphs Abs: 0.4 10*3/uL — ABNORMAL LOW (ref 0.7–4.0)
MCH: 33.1 pg (ref 26.0–34.0)
MCHC: 33.8 g/dL (ref 30.0–36.0)
MCV: 97.8 fL (ref 80.0–100.0)
Monocytes Absolute: 0.8 10*3/uL (ref 0.1–1.0)
Monocytes Relative: 11 %
Neutro Abs: 5.6 10*3/uL (ref 1.7–7.7)
Neutrophils Relative %: 80 %
Platelets: 44 10*3/uL — ABNORMAL LOW (ref 150–400)
RBC: 2.78 MIL/uL — ABNORMAL LOW (ref 4.22–5.81)
RDW: 14.4 % (ref 11.5–15.5)
WBC: 7 10*3/uL (ref 4.0–10.5)
nRBC: 0.3 % — ABNORMAL HIGH (ref 0.0–0.2)

## 2023-09-12 NOTE — Telephone Encounter (Signed)
Called pt to follow up. He states that he has not had any diarrhea today and his appetite is back and he has been drinking plenty of fluids. Advised him to call back if diarrhea returns so he can be evaluated by [facility]. Pt verbalized understanding.  He also states that he will take last Revlimid dose in the morning. Advised to not start new cycle until he sees Dr. Cathie Hoops on 1/6.

## 2023-09-15 ENCOUNTER — Encounter: Payer: Self-pay | Admitting: Oncology

## 2023-09-15 ENCOUNTER — Other Ambulatory Visit: Payer: Self-pay

## 2023-09-15 ENCOUNTER — Other Ambulatory Visit: Payer: Self-pay | Admitting: *Deleted

## 2023-09-15 DIAGNOSIS — C9000 Multiple myeloma not having achieved remission: Secondary | ICD-10-CM

## 2023-09-15 MED ORDER — LENALIDOMIDE 10 MG PO CAPS: 10.0000 mg | ORAL_CAPSULE | Freq: Every day | ORAL | 0 refills | Status: DC

## 2023-09-15 NOTE — Progress Notes (Signed)
 Error

## 2023-09-19 ENCOUNTER — Telehealth: Payer: Self-pay

## 2023-09-19 ENCOUNTER — Other Ambulatory Visit: Payer: Self-pay | Admitting: Oncology

## 2023-09-19 ENCOUNTER — Inpatient Hospital Stay: Payer: Medicare HMO | Admitting: Oncology

## 2023-09-19 ENCOUNTER — Inpatient Hospital Stay: Payer: Medicare HMO

## 2023-09-19 ENCOUNTER — Other Ambulatory Visit (HOSPITAL_COMMUNITY): Payer: Self-pay

## 2023-09-19 ENCOUNTER — Encounter: Payer: Self-pay | Admitting: Oncology

## 2023-09-19 ENCOUNTER — Other Ambulatory Visit: Payer: Medicare HMO

## 2023-09-19 DIAGNOSIS — C9000 Multiple myeloma not having achieved remission: Secondary | ICD-10-CM

## 2023-09-19 MED ORDER — LENALIDOMIDE 10 MG PO CAPS: 10.0000 mg | ORAL_CAPSULE | Freq: Every day | ORAL | 0 refills | Status: DC

## 2023-09-19 NOTE — Telephone Encounter (Signed)
 Called pt to follow up on today's missed appt for tx. Pt states that he has a bad cold and didn't want to come in. He states he will reach out to PCP to get something for the cold. He would like to keep appt on 1/13 as scheduled.   I told him that he will be contacted if any appt changes are made, but we will keep apt for 1/13

## 2023-09-19 NOTE — Telephone Encounter (Signed)
 Treatment has been deferred to 1/13.

## 2023-09-19 NOTE — Addendum Note (Signed)
 Addended by: Remi Haggard on: 09/19/2023 11:11 AM   Modules accepted: Orders

## 2023-09-19 NOTE — Telephone Encounter (Signed)
 I was routed a message from triage from [facility] that this patient can no longer fill at [facility] due to insurance restrictions. They stated the patient would need to fill at [facility].   Patient's Revlimid  rx was redirected to [facility]. Patient aware rx was sent to [facility] and provided with their phone number.  Ben faxed over supportive information to [facility].

## 2023-09-19 NOTE — Telephone Encounter (Signed)
 Received notification that patient's insurance is now requiring patient to fill at [facility]. Script and all supporting information sent to [facility] for processing and fulfillment.    Morene Potters, CPhT Oncology Pharmacy Patient Advocate  [facility]  [phone] (phone) [phone] (fax) 09/19/2023 11:20 AM

## 2023-09-20 ENCOUNTER — Other Ambulatory Visit: Payer: Self-pay

## 2023-09-22 ENCOUNTER — Ambulatory Visit: Payer: Medicare HMO

## 2023-09-26 ENCOUNTER — Ambulatory Visit: Payer: Medicare HMO

## 2023-09-26 ENCOUNTER — Inpatient Hospital Stay: Payer: Medicare HMO | Attending: Oncology | Admitting: Oncology

## 2023-09-26 ENCOUNTER — Inpatient Hospital Stay: Payer: Medicare HMO

## 2023-09-26 ENCOUNTER — Encounter: Payer: Self-pay | Admitting: Oncology

## 2023-09-26 ENCOUNTER — Other Ambulatory Visit: Payer: Medicare HMO

## 2023-09-26 VITALS — BP 147/83 | HR 86 | Temp 97.9°F | Resp 18 | Wt 177.1 lb

## 2023-09-26 DIAGNOSIS — R634 Abnormal weight loss: Secondary | ICD-10-CM | POA: Insufficient documentation

## 2023-09-26 DIAGNOSIS — C9000 Multiple myeloma not having achieved remission: Secondary | ICD-10-CM | POA: Diagnosis present

## 2023-09-26 DIAGNOSIS — Z5112 Encounter for antineoplastic immunotherapy: Secondary | ICD-10-CM | POA: Diagnosis present

## 2023-09-26 DIAGNOSIS — R600 Localized edema: Secondary | ICD-10-CM | POA: Insufficient documentation

## 2023-09-26 DIAGNOSIS — Z79899 Other long term (current) drug therapy: Secondary | ICD-10-CM | POA: Diagnosis not present

## 2023-09-26 DIAGNOSIS — N1832 Chronic kidney disease, stage 3b: Secondary | ICD-10-CM | POA: Diagnosis not present

## 2023-09-26 DIAGNOSIS — D696 Thrombocytopenia, unspecified: Secondary | ICD-10-CM

## 2023-09-26 DIAGNOSIS — T451X5A Adverse effect of antineoplastic and immunosuppressive drugs, initial encounter: Secondary | ICD-10-CM | POA: Insufficient documentation

## 2023-09-26 DIAGNOSIS — I4891 Unspecified atrial fibrillation: Secondary | ICD-10-CM | POA: Diagnosis not present

## 2023-09-26 DIAGNOSIS — D6481 Anemia due to antineoplastic chemotherapy: Secondary | ICD-10-CM | POA: Insufficient documentation

## 2023-09-26 DIAGNOSIS — D6959 Other secondary thrombocytopenia: Secondary | ICD-10-CM | POA: Diagnosis not present

## 2023-09-26 DIAGNOSIS — R197 Diarrhea, unspecified: Secondary | ICD-10-CM | POA: Insufficient documentation

## 2023-09-26 DIAGNOSIS — N183 Chronic kidney disease, stage 3 unspecified: Secondary | ICD-10-CM | POA: Diagnosis not present

## 2023-09-26 DIAGNOSIS — Z7901 Long term (current) use of anticoagulants: Secondary | ICD-10-CM | POA: Insufficient documentation

## 2023-09-26 LAB — CBC WITH DIFFERENTIAL (CANCER CENTER ONLY)
Abs Immature Granulocytes: 0.06 10*3/uL (ref 0.00–0.07)
Basophils Absolute: 0.1 10*3/uL (ref 0.0–0.1)
Basophils Relative: 1 %
Eosinophils Absolute: 0.2 10*3/uL (ref 0.0–0.5)
Eosinophils Relative: 5 %
HCT: 28.5 % — ABNORMAL LOW (ref 39.0–52.0)
Hemoglobin: 9.4 g/dL — ABNORMAL LOW (ref 13.0–17.0)
Immature Granulocytes: 1 %
Lymphocytes Relative: 11 %
Lymphs Abs: 0.5 10*3/uL — ABNORMAL LOW (ref 0.7–4.0)
MCH: 32.3 pg (ref 26.0–34.0)
MCHC: 33 g/dL (ref 30.0–36.0)
MCV: 97.9 fL (ref 80.0–100.0)
Monocytes Absolute: 0.4 10*3/uL (ref 0.1–1.0)
Monocytes Relative: 9 %
Neutro Abs: 3.6 10*3/uL (ref 1.7–7.7)
Neutrophils Relative %: 73 %
Platelet Count: 252 10*3/uL (ref 150–400)
RBC: 2.91 MIL/uL — ABNORMAL LOW (ref 4.22–5.81)
RDW: 14.4 % (ref 11.5–15.5)
WBC Count: 5 10*3/uL (ref 4.0–10.5)
nRBC: 0 % (ref 0.0–0.2)

## 2023-09-26 LAB — CMP (CANCER CENTER ONLY)
ALT: 34 U/L (ref 0–44)
AST: 26 U/L (ref 15–41)
Albumin: 3.5 g/dL (ref 3.5–5.0)
Alkaline Phosphatase: 95 U/L (ref 38–126)
Anion gap: 9 (ref 5–15)
BUN: 20 mg/dL (ref 8–23)
CO2: 27 mmol/L (ref 22–32)
Calcium: 9.3 mg/dL (ref 8.9–10.3)
Chloride: 99 mmol/L (ref 98–111)
Creatinine: 1.09 mg/dL (ref 0.61–1.24)
GFR, Estimated: >60 mL/min (ref >60)
Glucose, Bld: 209 mg/dL — ABNORMAL HIGH (ref 70–99)
Potassium: 4.3 mmol/L (ref 3.5–5.1)
Sodium: 135 mmol/L (ref 135–145)
Total Bilirubin: 0.7 mg/dL (ref 0.0–1.2)
Total Protein: 6.9 g/dL (ref 6.5–8.1)

## 2023-09-26 NOTE — Assessment & Plan Note (Signed)
 Encourage oral hydration and avoid nephrotoxins.

## 2023-09-26 NOTE — Assessment & Plan Note (Signed)
 Anemia due to chemotherapy. There maybe a component of anemia of CKD.  Monitor.

## 2023-09-26 NOTE — Assessment & Plan Note (Addendum)
 Chemotherapy induced. Counts have normalized.

## 2023-09-26 NOTE — Assessment & Plan Note (Signed)
 Check C diff and GI panel.  If negative. Recommend anti diarrhea medication.

## 2023-09-26 NOTE — Assessment & Plan Note (Addendum)
 Bone marrow biopsy was reviewed. 65% plasma cell involvement, IgA lamda, M protein 4.3,  IgA lamda multiple myeloma, recommend Dara Rvd - revlimid  10mg  2 weeks on 1 week off Labs are reviewed and discussed with patient. Cycle 3 D15 Daratumumab  was held due to cytopenia and being on anticoagulation.  S/p 4 cycles  Daratumumab  and Velcade  with revlimid  10mg  2 weeks on 1 week off  Cycle 4 D15 missed due to hospitalization due to ileus. BM biopsy at [facility] after 4 cycles showed 50-60% plasma cells.  Labs are reviewed and discussed with patient.  [facility]. Recommend additional 3-4 cycles.  Hold off cycle 7 Dara Rvd  Revlimid  10mg  daily 2 weeks on 1 week off due to diarrhea.  His myeloma labs are stable, however it has not dramatically improved. He has difficulties tolerating high dose of treatment.  Continue monitor disease status.   Continue Acyclovir , At high risk of thrombosis, on Eliquis  5mg  BID for A fib Xegeva monthly-  around 12/23, if calcium  meets criteria.  Recommend calcium  and vitamin D  supplementation.   Follow up with [facility] in the future evaluation for BM transplant - no amyloidosis on cardiac MRI

## 2023-09-26 NOTE — Assessment & Plan Note (Signed)
 Recommend patient to take calcium 1200mg  daily. Take vitamin D supplementation.

## 2023-09-26 NOTE — Progress Notes (Signed)
 Hematology/Oncology Progress note [phone] [phone]         Patient Care Team: Cleotilde Oneil FALCON, MD as PCP - General (Internal Medicine) Darliss Rogue, MD as PCP - Cardiology (Cardiology) Babara Call, MD as Consulting Physician (Oncology)  CHIEF COMPLAINTS/REASON FOR VISIT:  Multiple myeloma   ASSESSMENT & PLAN:   Cancer Staging  Multiple myeloma [facility]) Staging form: Plasma Cell Myeloma and Plasma Cell Disorders, AJCC 8th Edition - Clinical stage from 04/14/2023: RISS Stage II (Beta-2 -microglobulin (mg/L): 8.4, Albumin (g/dL): 2.6, ISS: Stage III, High-risk cytogenetics: Absent, LDH: Normal) - Signed by Babara Call, MD on 04/22/2023   Multiple myeloma (HCC) Bone marrow biopsy was reviewed. 65% plasma cell involvement, IgA lamda, M protein 4.3,  IgA lamda multiple myeloma, recommend Dara Rvd - revlimid  10mg  2 weeks on 1 week off Labs are reviewed and discussed with patient. Cycle 3 D15 Daratumumab  was held due to cytopenia and being on anticoagulation.  S/p 4 cycles  Daratumumab  and Velcade  with revlimid  10mg  2 weeks on 1 week off  Cycle 4 D15 missed due to hospitalization due to ileus. BM biopsy at [facility] after 4 cycles showed 50-60% plasma cells.  Labs are reviewed and discussed with patient.  [facility]. Recommend additional 3-4 cycles.  Hold off cycle 7 Dara Rvd  Revlimid  10mg  daily 2 weeks on 1 week off due to diarrhea.  His myeloma labs are stable, however it has not dramatically improved. He has difficulties tolerating high dose of treatment.  Continue monitor disease status.   Continue Acyclovir , At high risk of thrombosis, on Eliquis  5mg  BID for A fib Xegeva monthly-  around 12/23, if calcium  meets criteria.  Recommend calcium  and vitamin D  supplementation.   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 There maybe a  component of anemia of CKD.  Monitor.   Diarrhea Check C diff and GI panel.  If negative. Recommend anti diarrhea medication.   CKD (chronic kidney disease) stage 3, GFR 30-59 ml/min (HCC) Encourage oral hydration and avoid nephrotoxins.    Hypocalcemia Recommend patient to take calcium  1200mg  daily. Take vitamin D  supplementation.   Thrombocytopenia (HCC) Chemotherapy induced. Counts have normalized.   Atrial fibrillation (HCC) Echo was done on 06/14/23 LVEF 50% Follow up with cardiology for evaluation.  Continue Eliquis  5mg  BID    Orders Placed This Encounter  Procedures   C difficile quick screen w PCR reflex    Standing Status:   Future    Expected Date:   09/26/2023    Expiration Date:   09/25/2024   Gastrointestinal Panel by PCR , Stool    Standing Status:   Future    Expected Date:   09/26/2023    Expiration Date:   09/25/2024   CBC with Differential (Cancer Center Only)    Standing Status:   Future    Expected Date:   10/03/2023    Expiration Date:   10/02/2024   CMP (Cancer Center only)    Standing Status:   Future    Expected Date:   10/03/2023    Expiration Date:   10/02/2024   Follow-up per LOS  We spent sufficient time to discuss many aspect of care, questions were answered to patient's satisfaction.    Call Babara, MD, PhD [facility] Hematology Oncology 09/26/2023     HISTORY OF PRESENTING ILLNESS:  Victor Phillips is a  68 y.o.  male with PMH listed below who was referred to me for anemia  02/11/2023 - 02/14/2023 recent hospitalization due to pneumonia, respiratory failure.  He was found to have a hemoglobin decreased at 6.8, status post PRBC transfusion during admission.  EGD showed gastritis.  Colonoscopy was not remarkable. 02/11/2023 TIBC 221 ferritin 107, iron saturation 16. Patient is currently taking fusion plus Vitamin B12 level in the 300s. His echo showed grade 2 diastolic CHF.  He denies recent chest pain on exertion, shortness of breath on  minimal exertion, pre-syncopal episodes, or palpitations He had not noticed any recent bleeding such as epistaxis, hematuria or hematochezia.  He denies over the counter NSAID ingestion.  Oncology History  Multiple myeloma (HCC)  04/14/2023 Initial Diagnosis   Multiple myeloma   03/13/2020 multiple myeloma panel showed M protein 4.3, IgA lambda Free lamda Level 142,-light chain ratio 0.07 04/05/2023 bone marrow biopsy showed hypercellular bone marrow with plasma cell neoplasm.The bone marrow is hypercellular for age with prominent increase in plasma cells representing 65% of all cells in the aspirate associated with interstitial infiltrates and numerous variably sized aggregates in  the clot and biopsy sections.  The plasma cells display lambda light chain restriction consistent with plasma cell neoplasm.  Normal cytogenetics.  FISH studies pending.    04/14/2023 Cancer Staging   Staging form: Plasma Cell Myeloma and Plasma Cell Disorders, AJCC 8th Edition - Clinical stage from 04/14/2023: RISS Stage II (Beta-2 -microglobulin (mg/L): 8.4, Albumin (g/dL): 2.6, ISS: Stage III, High-risk cytogenetics: Absent, LDH: Normal) - Signed by Babara Call, MD on 04/22/2023 Stage prefix: Initial diagnosis Beta 2 microglobulin range (mg/L): Greater than or equal to 5.5 Albumin range (g/dL): Less than 3.5 Cytogenetics: 1q addition, Other mutation   04/14/2023 Imaging   Skeletal survey 1. No lytic lesions or other intrinsic bony abnormality. 2. Borderline cardiomegaly with an interval decrease in size. 3. Diffuse atheromatous arterial calcifications including bilateral carotid artery calcifications, right greater than left   04/22/2023 -  Chemotherapy   Patient is on Treatment Plan : MYELOMA NEWLY DIAGNOSED TRANSPLANT CANDIDATE DaraVRd (Daratumumab  SQ) q21d     04/25/2023 Imaging   PET scan showed  1. Two tiny hypermetabolic lucent lesions in the left posteromedial eighth and ninth ribs. No additional evidence of  multiple myeloma. 2. Aortic atherosclerosis (ICD10-I70.0). Coronary artery calcification.   07/14/2023 - 07/19/2023 Hospital Admission   Hospitalized due to ileus.    07/25/2023 Bone Marrow Biopsy   A-C. Peripheral blood and bone marrow, (peripheral smear, aspirate smear, touch preparation, clot section, core biopsy):   Persistent plasma cell myeloma, 50-60% lambda restricted plasma cells on core biopsy. Hypercellular bone marrow (70%) with trilineage hematopoiesis. Negative for increased blasts. Negative for significant dysplasia. Negative for amyloid deposition.     A Fib. Lowe extremity edema bilaterally, and SOB with exertion.  on Eliquis  5mg  BID, follows up with cardiology.  Denies chest pain, abdominal pain, nausea, vomiting, diarrhea + Fatigue + bilateral lower extremity edema, he takes Lasix  20mg  daily PRN and edema has improved.  + diarrhea for 2 weeks. RN called her on 12/127/24 to follow up on his diarrhea symptoms and offered him to come to clinic for evaluation. He reports feeling better, appetite was back and diarrhea improved. 09/19/2023 patient called to reschedule treatment as he had a bad cold and did not want to come in for treatment.   Today patient reports that his diarrhea has improved, however still 1-2 loose BM per day.  He has tried  otc diarrhea medication.  Otherwise he feels well.    MEDICAL HISTORY:  Past Medical History:  Diagnosis Date   Atrial fibrillation (HCC)    Diabetes mellitus without complication (HCC)    Hypercholesteremia    Hypertension    Multiple myeloma not having achieved remission [facility])     SURGICAL HISTORY: Past Surgical History:  Procedure Laterality Date   COLONOSCOPY N/A 02/13/2023   Procedure: COLONOSCOPY;  Surgeon: Toledo, Ladell POUR, MD;  Location: [facility] ENDOSCOPY;  Service: Gastroenterology;  Laterality: N/A;   COLONOSCOPY N/A 02/14/2023   Procedure: COLONOSCOPY;  Surgeon: Toledo, Ladell POUR, MD;  Location: [facility] ENDOSCOPY;   Service: Gastroenterology;  Laterality: N/A;   ESOPHAGOGASTRODUODENOSCOPY N/A 02/13/2023   Procedure: ESOPHAGOGASTRODUODENOSCOPY (EGD);  Surgeon: Toledo, Ladell POUR, MD;  Location: [facility] ENDOSCOPY;  Service: Gastroenterology;  Laterality: N/A;    SOCIAL HISTORY: Social History   Socioeconomic History   Marital status: Married    Spouse name: Not on file   Number of children: Not on file   Years of education: Not on file   Highest education level: Not on file  Occupational History   Not on file  Tobacco Use   Smoking status: Never   Smokeless tobacco: Never  Vaping Use   Vaping status: Never Used  Substance and Sexual Activity   Alcohol use: Not Currently    Comment: quit approx 2001   Drug use: No   Sexual activity: Not on file  Other Topics Concern   Not on file  Social History Narrative   Not on file   Social Drivers of Health   Financial Resource Strain: Low Risk  (07/22/2023)   Received from [facility]   Overall Financial Resource Strain (CARDIA)    Difficulty of Paying Living Expenses: Not hard at all  Recent Concern: Financial Resource Strain - High Risk (07/05/2023)   Received from [facility]   Overall Financial Resource Strain (CARDIA)    Difficulty of Paying Living Expenses: Hard  Food Insecurity: No Food Insecurity (07/22/2023)   Received from [facility]   Hunger Vital Sign    Worried About Running Out of Food in the Last Year: Never true    Ran Out of Food in the Last Year: Never true  Recent Concern: Food Insecurity - Food Insecurity Present (07/05/2023)   Received from [facility]   Hunger Vital Sign    Worried About Running Out of Food in the Last Year: Sometimes true    Ran Out of Food in the Last Year: Sometimes true  Transportation Needs: No Transportation Needs (07/22/2023)   Received from [facility] - Transportation    In the past 12 months, has  lack of transportation kept you from medical appointments or from getting medications?: No    Lack of Transportation (Non-Medical): No  Physical Activity: Sufficiently Active (09/24/2020)   Received from [facility], [facility]   Exercise Vital Sign    Days of Exercise per Week: 6 days    Minutes of Exercise per Session: 30 min  Stress: No Stress Concern Present (09/24/2020)   Received from [facility], [facility]   [employer] of Occupational Health - Occupational Stress Questionnaire    Feeling of Stress : Not at all  Social Connections: Socially Integrated (09/24/2020)   Received from [facility], [facility]   Social Connection and Isolation  Panel [NHANES]    Frequency of Communication with Friends and Family: More than three times a week    Frequency of Social Gatherings with Friends and Family: Once a week    Attends Religious Services: More than 4 times per year    Active Member of [employer] or Organizations: Yes    Attends Engineer, Structural: More than 4 times per year    Marital Status: Married  Catering Manager Violence: Not At Risk (07/14/2023)   Humiliation, Afraid, Rape, and Kick questionnaire    Fear of Current or Ex-Partner: No    Emotionally Abused: No    Physically Abused: No    Sexually Abused: No    FAMILY HISTORY: Family History  Problem Relation Age of Onset   Diabetes Mother    Heart attack Father     ALLERGIES:  has no known allergies.  MEDICATIONS:  Current Outpatient Medications  Medication Sig Dispense Refill   acetaminophen  (TYLENOL ) 325 MG tablet Take 2 tablets (650 mg total) by mouth every 6 (six) hours as needed for mild pain (or Fever >/= 101). 90 tablet 1   acyclovir  (ZOVIRAX ) 400 MG tablet Take 1 tablet (400 mg total) by mouth 2 (two) times daily. 60 tablet 5   apixaban  (ELIQUIS ) 5 MG TABS tablet Take 1 tablet (5 mg total)  by mouth 2 (two) times daily. 180 tablet 0   atorvastatin  (LIPITOR) 10 MG tablet Take 1 tablet by mouth daily.     calcium  carbonate (OS-CAL) 600 MG TABS tablet Take 2 tablets (1,200 mg total) by mouth daily.     cholecalciferol (VITAMIN D3) 25 MCG (1000 UNIT) tablet Take 1 tablet (1,000 Units total) by mouth daily.     furosemide  (LASIX ) 20 MG tablet Take 1 tablet (20 mg total) by mouth daily as needed for edema or fluid. 90 tablet 0   glipiZIDE (GLUCOTROL XL) 2.5 MG 24 hr tablet Take by mouth.     lenalidomide  (REVLIMID ) 10 MG capsule Take 1 capsule (10 mg total) by mouth daily. Take for 14 days, then hold for 7 days off. Repeat every 21 days. 14 capsule 0   metoprolol  succinate (TOPROL -XL) 50 MG 24 hr tablet Take 1 tablet (50 mg total) by mouth daily. Take with or immediately following a meal. 90 tablet 3   Multiple Vitamin (MULTIVITAMIN WITH MINERALS) TABS tablet Take 1 tablet by mouth daily. 90 tablet 1   ondansetron  (ZOFRAN ) 8 MG tablet Take 1 tablet (8 mg total) by mouth every 8 (eight) hours as needed for nausea or vomiting. 30 tablet 1   pantoprazole  (PROTONIX ) 40 MG tablet Take 1 tablet (40 mg total) by mouth daily. 30 tablet 1   prochlorperazine  (COMPAZINE ) 10 MG tablet Take 1 tablet (10 mg total) by mouth every 6 (six) hours as needed for nausea or vomiting. 30 tablet 1   temazepam (RESTORIL) 15 MG capsule Take by mouth.     polyethylene glycol (MIRALAX ) 17 g packet Take 17 g by mouth daily. (Patient not taking: Reported on 08/10/2023) 14 each 0   senna (SENOKOT) 8.6 MG TABS tablet Take 2 tablets (17.2 mg total) by mouth daily. (Patient not taking: Reported on 08/10/2023) 120 tablet 0   No current facility-administered medications for this visit.    Review of Systems  Constitutional:  Positive for fatigue. Negative for appetite change, chills, fever and unexpected weight change.  HENT:   Negative for hearing loss and voice change.   Eyes:  Negative for eye problems  and icterus.   Respiratory:  Negative for chest tightness, cough and shortness of breath.   Cardiovascular:  Positive for leg swelling. Negative for chest pain.  Gastrointestinal:  Positive for diarrhea. Negative for abdominal distention and abdominal pain.  Endocrine: Negative for hot flashes.  Genitourinary:  Negative for difficulty urinating, dysuria and frequency.   Musculoskeletal:  Negative for arthralgias.  Skin:  Negative for itching and rash.  Neurological:  Negative for light-headedness and numbness.  Hematological:  Negative for adenopathy. Does not bruise/bleed easily.  Psychiatric/Behavioral:  Positive for sleep disturbance. Negative for confusion.     PHYSICAL EXAMINATION: Vitals:   09/26/23 0854  BP: (!) 147/83  Pulse: 86  Resp: 18  Temp: 97.9 F (36.6 C)   Filed Weights   09/26/23 0854  Weight: 177 lb 1.6 oz (80.3 kg)    Physical Exam Constitutional:      General: He is not in acute distress.    Appearance: He is obese.  HENT:     Head: Normocephalic and atraumatic.  Eyes:     General: No scleral icterus. Cardiovascular:     Rate and Rhythm: Normal rate and regular rhythm.  Pulmonary:     Effort: Pulmonary effort is normal. No respiratory distress.     Comments: Crackles at the base of lung  Abdominal:     General: Bowel sounds are normal. There is no distension.     Palpations: Abdomen is soft.  Musculoskeletal:        General: No deformity. Normal range of motion.     Cervical back: Normal range of motion and neck supple.     Right lower leg: Edema present.     Left lower leg: Edema present.  Skin:    General: Skin is warm and dry.     Findings: No erythema or rash.  Neurological:     Mental Status: He is alert and oriented to person, place, and time. Mental status is at baseline.     Cranial Nerves: No cranial nerve deficit.  Psychiatric:        Mood and Affect: Mood normal.      LABORATORY DATA:  I have reviewed the data as listed    Latest Ref Rng  & Units 09/26/2023    8:09 AM 09/12/2023    8:07 AM 09/08/2023    1:55 PM  CBC  WBC 4.0 - 10.5 K/uL 5.0  7.0  4.0   Hemoglobin 13.0 - 17.0 g/dL 9.4  9.2  9.1   Hematocrit 39.0 - 52.0 % 28.5  27.2  26.9   Platelets 150 - 400 K/uL 252  44  60       Latest Ref Rng & Units 09/26/2023    8:09 AM 09/08/2023    1:55 PM 09/05/2023    1:02 PM  CMP  Glucose 70 - 99 mg/dL 790  766  873   BUN 8 - 23 mg/dL 20  30  21    Creatinine 0.61 - 1.24 mg/dL 8.90  8.96  8.97   Sodium 135 - 145 mmol/L 135  135  136   Potassium 3.5 - 5.1 mmol/L 4.3  3.5  3.9   Chloride 98 - 111 mmol/L 99  100  99   CO2 22 - 32 mmol/L 27  24  28    Calcium  8.9 - 10.3 mg/dL 9.3  7.8  8.8   Total Protein 6.5 - 8.1 g/dL 6.9  6.2  6.9   Total Bilirubin 0.0 - 1.2 mg/dL  0.7  0.9  0.6   Alkaline Phos 38 - 126 U/L 95  64  74   AST 15 - 41 U/L 26  14  18    ALT 0 - 44 U/L 34  34  28    Lab Results  Component Value Date   IRON 100 03/14/2023   TIBC 272 03/14/2023   IRONPCTSAT 37 03/14/2023   FERRITIN 249 03/14/2023     RADIOGRAPHIC STUDIES: I have personally reviewed the radiological images as listed and agreed with the findings in the report. No results found.

## 2023-09-26 NOTE — Assessment & Plan Note (Signed)
 Echo was done on 06/14/23 LVEF 50% Follow up with cardiology for evaluation.  Continue Eliquis 5mg  BID

## 2023-09-27 ENCOUNTER — Other Ambulatory Visit: Payer: Self-pay

## 2023-09-27 LAB — KAPPA/LAMBDA LIGHT CHAINS
Kappa free light chain: 6.6 mg/L (ref 3.3–19.4)
Kappa, lambda light chain ratio: 0.08 — ABNORMAL LOW (ref 0.26–1.65)
Lambda free light chains: 83.4 mg/L — ABNORMAL HIGH (ref 5.7–26.3)

## 2023-09-29 ENCOUNTER — Ambulatory Visit: Payer: Medicare HMO

## 2023-10-03 ENCOUNTER — Encounter: Payer: Self-pay | Admitting: Oncology

## 2023-10-03 ENCOUNTER — Ambulatory Visit: Payer: Medicare HMO

## 2023-10-03 ENCOUNTER — Inpatient Hospital Stay (HOSPITAL_BASED_OUTPATIENT_CLINIC_OR_DEPARTMENT_OTHER): Payer: Medicare HMO | Admitting: Oncology

## 2023-10-03 ENCOUNTER — Inpatient Hospital Stay: Payer: Medicare HMO

## 2023-10-03 ENCOUNTER — Other Ambulatory Visit: Payer: Self-pay

## 2023-10-03 ENCOUNTER — Other Ambulatory Visit: Payer: Medicare HMO

## 2023-10-03 VITALS — BP 144/96 | HR 89 | Temp 97.6°F | Resp 20 | Wt 162.0 lb

## 2023-10-03 DIAGNOSIS — R197 Diarrhea, unspecified: Secondary | ICD-10-CM

## 2023-10-03 DIAGNOSIS — R600 Localized edema: Secondary | ICD-10-CM

## 2023-10-03 DIAGNOSIS — C9000 Multiple myeloma not having achieved remission: Secondary | ICD-10-CM

## 2023-10-03 DIAGNOSIS — Z5112 Encounter for antineoplastic immunotherapy: Secondary | ICD-10-CM | POA: Diagnosis not present

## 2023-10-03 DIAGNOSIS — R634 Abnormal weight loss: Secondary | ICD-10-CM

## 2023-10-03 DIAGNOSIS — D6481 Anemia due to antineoplastic chemotherapy: Secondary | ICD-10-CM | POA: Diagnosis not present

## 2023-10-03 DIAGNOSIS — N1832 Chronic kidney disease, stage 3b: Secondary | ICD-10-CM | POA: Diagnosis not present

## 2023-10-03 DIAGNOSIS — I4891 Unspecified atrial fibrillation: Secondary | ICD-10-CM

## 2023-10-03 DIAGNOSIS — T451X5A Adverse effect of antineoplastic and immunosuppressive drugs, initial encounter: Secondary | ICD-10-CM

## 2023-10-03 DIAGNOSIS — Z5111 Encounter for antineoplastic chemotherapy: Secondary | ICD-10-CM

## 2023-10-03 LAB — CMP (CANCER CENTER ONLY)
ALT: 22 U/L (ref 0–44)
AST: 17 U/L (ref 15–41)
Albumin: 3.8 g/dL (ref 3.5–5.0)
Alkaline Phosphatase: 87 U/L (ref 38–126)
Anion gap: 11 (ref 5–15)
BUN: 23 mg/dL (ref 8–23)
CO2: 25 mmol/L (ref 22–32)
Calcium: 9 mg/dL (ref 8.9–10.3)
Chloride: 100 mmol/L (ref 98–111)
Creatinine: 0.89 mg/dL (ref 0.61–1.24)
GFR, Estimated: >60 mL/min (ref >60)
Glucose, Bld: 177 mg/dL — ABNORMAL HIGH (ref 70–99)
Potassium: 4.3 mmol/L (ref 3.5–5.1)
Sodium: 136 mmol/L (ref 135–145)
Total Bilirubin: 0.6 mg/dL (ref 0.0–1.2)
Total Protein: 7.8 g/dL (ref 6.5–8.1)

## 2023-10-03 LAB — CBC WITH DIFFERENTIAL (CANCER CENTER ONLY)
Abs Immature Granulocytes: 0.04 10*3/uL (ref 0.00–0.07)
Basophils Absolute: 0.1 10*3/uL (ref 0.0–0.1)
Basophils Relative: 1 %
Eosinophils Absolute: 0.6 10*3/uL — ABNORMAL HIGH (ref 0.0–0.5)
Eosinophils Relative: 8 %
HCT: 33.3 % — ABNORMAL LOW (ref 39.0–52.0)
Hemoglobin: 11.2 g/dL — ABNORMAL LOW (ref 13.0–17.0)
Immature Granulocytes: 1 %
Lymphocytes Relative: 14 %
Lymphs Abs: 1.1 10*3/uL (ref 0.7–4.0)
MCH: 32.9 pg (ref 26.0–34.0)
MCHC: 33.6 g/dL (ref 30.0–36.0)
MCV: 97.9 fL (ref 80.0–100.0)
Monocytes Absolute: 0.6 10*3/uL (ref 0.1–1.0)
Monocytes Relative: 8 %
Neutro Abs: 5.2 10*3/uL (ref 1.7–7.7)
Neutrophils Relative %: 68 %
Platelet Count: 210 10*3/uL (ref 150–400)
RBC: 3.4 MIL/uL — ABNORMAL LOW (ref 4.22–5.81)
RDW: 14.8 % (ref 11.5–15.5)
WBC Count: 7.5 10*3/uL (ref 4.0–10.5)
nRBC: 0 % (ref 0.0–0.2)

## 2023-10-03 LAB — GASTROINTESTINAL PANEL BY PCR, STOOL (REPLACES STOOL CULTURE)

## 2023-10-03 LAB — MULTIPLE MYELOMA PANEL, SERUM
Albumin SerPl Elph-Mcnc: 3.5 g/dL (ref 2.9–4.4)
Albumin/Glob SerPl: 1.1 (ref 0.7–1.7)
Alpha 1: 0.3 g/dL (ref 0.0–0.4)
Alpha2 Glob SerPl Elph-Mcnc: 0.9 g/dL (ref 0.4–1.0)
B-Globulin SerPl Elph-Mcnc: 0.8 g/dL (ref 0.7–1.3)
Gamma Glob SerPl Elph-Mcnc: 1.3 g/dL (ref 0.4–1.8)
Globulin, Total: 3.3 g/dL (ref 2.2–3.9)
IgA: 1855 mg/dL — ABNORMAL HIGH (ref 61–437)
IgG (Immunoglobin G), Serum: 225 mg/dL — ABNORMAL LOW (ref 603–1613)
IgM (Immunoglobulin M), Srm: 10 mg/dL — ABNORMAL LOW (ref 20–172)
M Protein SerPl Elph-Mcnc: 0.8 g/dL — ABNORMAL HIGH
Total Protein ELP: 6.8 g/dL (ref 6.0–8.5)

## 2023-10-03 LAB — C DIFFICILE QUICK SCREEN W PCR REFLEX
C Diff antigen: NEGATIVE
C Diff interpretation: NOT DETECTED
C Diff toxin: NEGATIVE

## 2023-10-03 MED ORDER — LOPERAMIDE HCL 2 MG PO CAPS: 2.0000 mg | ORAL_CAPSULE | ORAL | 2 refills | Status: AC

## 2023-10-03 NOTE — Assessment & Plan Note (Signed)
 Echo was done on 06/14/23 LVEF 50% Follow up with cardiology for evaluation.  Continue Eliquis 5mg  BID

## 2023-10-03 NOTE — Progress Notes (Addendum)
Hematology/Oncology Progress note [phone] [phone]         Patient Care Team: Danella Penton, MD as PCP - General (Internal Medicine) Debbe Odea, MD as PCP - Cardiology (Cardiology) [name], MD as Consulting Physician (Oncology)  CHIEF COMPLAINTS/REASON FOR VISIT:  Multiple myeloma   ASSESSMENT & PLAN:   Cancer Staging  Multiple myeloma [facility]) Staging form: Plasma Cell Myeloma and Plasma Cell Disorders, AJCC 8th Edition - Clinical stage from 04/14/2023: RISS Stage II (Beta-2-microglobulin (mg/L): 8.4, Albumin (g/dL): 2.6, ISS: Stage III, High-risk cytogenetics: Absent, LDH: Normal) - Signed by [name], MD on 04/22/2023   Multiple myeloma (HCC) Bone marrow biopsy was reviewed. 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 Cycle 3 D15 Daratumumab was held due to cytopenia and being on anticoagulation.  S/p 4 cycles  Daratumumab and Velcade with revlimid 10mg  2 weeks on 1 week off  Cycle 4 D15 missed due to hospitalization due to ileus. BM biopsy at [facility] after 4 cycles showed 50-60% plasma cells.  Labs are reviewed and discussed with patient.  [facility]. Recommend additional 3-4 cycles.  Hold off cycle 7 Dara Rvd  Revlimid 10mg  daily 2 weeks on 1 week off due to diarrhea.  His myeloma labs are stable, however it has not dramatically improved. He has difficulties tolerating intensive treatment.  Continue monitor disease status.   Continue Acyclovir, At risk of thrombosis, on Eliquis 5mg  BID for A fib Xegeva monthly-    Recommend calcium and vitamin D supplementation.   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 There maybe a component of anemia of CKD.  Monitor.    Bilateral lower extremity edema No DVT on US Echo LVEF 50% -55%. He has cardiology appt  Recommend Lasix 20mg  daily PRN for edema - he takes Daily.   CKD (chronic kidney disease) stage 3, GFR 30-59 ml/min (HCC) Encourage oral hydration and avoid nephrotoxins.    Atrial fibrillation (HCC) Echo was done on 06/14/23 LVEF 50% Follow up with cardiology for evaluation.  Continue Eliquis 5mg  BID   Diarrhea Check C diff and GI panel.  If negative. I will send anti diarrhea medication.   Addendum Stool GI panel and C Diff are negative.  Recommend imodium PRN, I have discussed the instruction with patient and wife.   Encounter for antineoplastic chemotherapy Chemotherapy plan as listed above. - hold treatment for now due to recent Ileus. pending decision of BM transplant    Hypocalcemia Recommend patient to take calcium 1200mg  daily. Take vitamin D supplementation.   Weight loss Could be due to diuretics use.  Follow up with nutritionist.     Orders Placed This Encounter  Procedures   CBC with Differential (Cancer Center Only)    Standing Status:   Future    Expected Date:   10/10/2023    Expiration Date:   10/09/2024   CMP (Cancer Center only)    Standing Status:   Future    Expected Date:   10/10/2023    Expiration Date:   10/09/2024   Follow-up per LOS  We spent sufficient time to discuss many aspect of care, questions were answered to patient's satisfaction.    [name], MD, PhD [facility] Oncology  10/03/2023     HISTORY OF PRESENTING ILLNESS:  Victor Phillips is a  68 y.o.  male with PMH listed below who was referred to me for anemia  02/11/2023 - 02/14/2023 recent hospitalization due to pneumonia, respiratory failure.  He was found to have a hemoglobin decreased at 6.8, status post PRBC transfusion during admission.  EGD showed gastritis.  Colonoscopy was not remarkable. 02/11/2023 TIBC 221 ferritin 107, iron saturation 16. Patient is currently taking  fusion plus Vitamin B12 level in the 300s. His echo showed grade 2 diastolic CHF.  He denies recent chest pain on exertion, shortness of breath on minimal exertion, pre-syncopal episodes, or palpitations He had not noticed any recent bleeding such as epistaxis, hematuria or hematochezia.  He denies over the counter NSAID ingestion.  Oncology History  Multiple myeloma (HCC)  04/14/2023 Initial Diagnosis   Multiple myeloma   03/13/2020 multiple myeloma panel showed M protein 4.3, IgA lambda Free lamda Level 142,-light chain ratio 0.07 04/05/2023 bone marrow biopsy showed hypercellular bone marrow with plasma cell neoplasm.The bone marrow is hypercellular for age with prominent increase in plasma cells representing 65% of all cells in the aspirate associated with interstitial infiltrates and numerous variably sized aggregates in  the clot and biopsy sections.  The plasma cells display lambda light chain restriction consistent with plasma cell neoplasm.  Normal cytogenetics.  FISH studies pending.    04/14/2023 Cancer Staging   Staging form: Plasma Cell Myeloma and Plasma Cell Disorders, AJCC 8th Edition - Clinical stage from 04/14/2023: RISS Stage II (Beta-2-microglobulin (mg/L): 8.4, Albumin (g/dL): 2.6, ISS: Stage III, High-risk cytogenetics: Absent, LDH: Normal) - Signed by [name], MD on 04/22/2023 Stage prefix: Initial diagnosis Beta 2 microglobulin range (mg/L): Greater than or equal to 5.5 Albumin range (g/dL): Less than 3.5 Cytogenetics: 1q addition, Other mutation   04/14/2023 Imaging   Skeletal survey 1. No lytic lesions or other intrinsic bony abnormality. 2. Borderline cardiomegaly with an interval decrease in size. 3. Diffuse atheromatous arterial calcifications including bilateral carotid artery calcifications, right greater than left   04/22/2023 -  Chemotherapy   Patient is on Treatment Plan : MYELOMA NEWLY DIAGNOSED TRANSPLANT CANDIDATE DaraVRd (Daratumumab SQ) q21d     04/25/2023  Imaging   PET scan showed  1. Two tiny hypermetabolic lucent lesions in the left posteromedial eighth and ninth ribs. No additional evidence of multiple myeloma. 2. Aortic atherosclerosis (ICD10-I70.0). Coronary artery calcification.   07/14/2023 - 07/19/2023 Hospital Admission   Hospitalized due to ileus.    07/25/2023 Bone Marrow Biopsy   A-C. Peripheral blood and bone marrow, (peripheral smear, aspirate smear, touch preparation, clot section, core biopsy):   Persistent plasma cell myeloma, 50-60% lambda restricted plasma cells on core biopsy. Hypercellular bone marrow (70%) with trilineage hematopoiesis. Negative for increased blasts. Negative for significant dysplasia. Negative for amyloid deposition.     A Fib. Lowe extremity edema bilaterally, and SOB with exertion.  on Eliquis 5mg  BID, follows up with cardiology.  Denies chest pain, abdominal pain, nausea, vomiting, diarrhea + Fatigue + bilateral lower extremity edema, he takes Lasix 20mg  daily PRN and edema has improved.  + weight loss of 15 pounds  + diarrhea . RN called her on 12/127/24 to follow up on his diarrhea symptoms and offered him to come to clinic for evaluation. He reports feeling better, appetite was back and diarrhea improved. 09/19/2023 patient called to reschedule treatment as he had a bad cold and did not want to come in for treatment.  09/26/2023 chemotherapy was held due to ongoing diarrhea. I recommended stool study which he has not submitted yet.  Today he reports that intermittent diarrhea episodes 3-5 loose BM on some days. Yesterday he had 3 episodes. He has taken otc anti diarrhea mediation.  Otherwise he feels well.    MEDICAL HISTORY:  Past Medical History:  Diagnosis Date   Atrial fibrillation (HCC)    Diabetes mellitus without complication (HCC)    Hypercholesteremia    Hypertension    Multiple myeloma not having achieved remission [facility])     SURGICAL HISTORY: Past Surgical History:   Procedure Laterality Date   COLONOSCOPY N/A 02/13/2023   Procedure: COLONOSCOPY;  Surgeon: Toledo, Boykin Nearing, MD;  Location: [facility] ENDOSCOPY;  Service: Gastroenterology;  Laterality: N/A;   COLONOSCOPY N/A 02/14/2023   Procedure: COLONOSCOPY;  Surgeon: Toledo, Boykin Nearing, MD;  Location: [facility] ENDOSCOPY;  Service: Gastroenterology;  Laterality: N/A;   ESOPHAGOGASTRODUODENOSCOPY N/A 02/13/2023   Procedure: ESOPHAGOGASTRODUODENOSCOPY (EGD);  Surgeon: Toledo, Boykin Nearing, MD;  Location: [facility] ENDOSCOPY;  Service: Gastroenterology;  Laterality: N/A;    SOCIAL HISTORY: Social History   Socioeconomic History   Marital status: Married    Spouse name: Not on file   Number of children: Not on file   Years of education: Not on file   Highest education level: Not on file  Occupational History   Not on file  Tobacco Use   Smoking status: Never   Smokeless tobacco: Never  Vaping Use   Vaping status: Never Used  Substance and Sexual Activity   Alcohol use: Not Currently    Comment: quit approx 2001   Drug use: No   Sexual activity: Not on file  Other Topics Concern   Not on file  Social History Narrative   Not on file   Social Drivers of Health   Financial Resource Strain: High Risk (09/29/2023)   Received from [facility]   Overall Financial Resource Strain (CARDIA)    Difficulty of Paying Living Expenses: Very hard  Food Insecurity: Food Insecurity Present (09/29/2023)   Received from [facility]   Hunger Vital Sign    Worried About Running Out of Food in the Last Year: Often true    Ran Out of Food in the Last Year: Often true  Transportation Needs: No Transportation Needs (09/29/2023)   Received from [facility] - Transportation    In the past 12 months, has lack of transportation kept you from medical appointments or from getting medications?: No    Lack of Transportation (Non-Medical): No  Physical Activity: Sufficiently  Active (09/24/2020)   Received from [facility] [place] [facility], [facility]   Exercise Vital Sign    Days of Exercise per Week: 6 days    Minutes of Exercise per Session: 30 min  Stress: No Stress Concern Present (09/24/2020)   Received from [facility], [facility]   [employer] of Occupational Health - Occupational Stress Questionnaire    Feeling of Stress : Not at all  Social Connections: Socially Integrated (09/24/2020)   Received from [facility], [facility]   Social Connection and Isolation Panel [NHANES]    Frequency of Communication with Friends and Family: More than three times a week    Frequency of Social Gatherings with Friends and Family: Once a week    Attends Religious Services: More than 4 times per year  Active Member of Clubs or Organizations: Yes    Attends Banker Meetings: More than 4 times per year    Marital Status: Married  Catering manager Violence: Not At Risk (07/14/2023)   Humiliation, Afraid, Rape, and Kick questionnaire    Fear of Current or Ex-Partner: No    Emotionally Abused: No    Physically Abused: No    Sexually Abused: No    FAMILY HISTORY: Family History  Problem Relation Age of Onset   Diabetes Mother    Heart attack Father     ALLERGIES:  has no known allergies.  MEDICATIONS:  Current Outpatient Medications  Medication Sig Dispense Refill   acetaminophen (TYLENOL) 325 MG tablet Take 2 tablets (650 mg total) by mouth every 6 (six) hours as needed for mild pain (or Fever >/= 101). 90 tablet 1   acyclovir (ZOVIRAX) 400 MG tablet Take 1 tablet (400 mg total) by mouth 2 (two) times daily. 60 tablet 5   apixaban (ELIQUIS) 5 MG TABS tablet Take 1 tablet (5 mg total) by mouth 2 (two) times daily. 180 tablet 0   atorvastatin (LIPITOR) 10 MG tablet Take 1 tablet by mouth daily.     calcium carbonate (OS-CAL) 600 MG TABS tablet Take 2  tablets (1,200 mg total) by mouth daily.     cholecalciferol (VITAMIN D3) 25 MCG (1000 UNIT) tablet Take 1 tablet (1,000 Units total) by mouth daily.     furosemide (LASIX) 20 MG tablet Take 1 tablet (20 mg total) by mouth daily as needed for edema or fluid. 90 tablet 0   glipiZIDE (GLUCOTROL XL) 2.5 MG 24 hr tablet Take by mouth.     lenalidomide (REVLIMID) 10 MG capsule Take 1 capsule (10 mg total) by mouth daily. Take for 14 days, then hold for 7 days off. Repeat every 21 days. 14 capsule 0   metoprolol succinate (TOPROL-XL) 50 MG 24 hr tablet Take 1 tablet (50 mg total) by mouth daily. Take with or immediately following a meal. 90 tablet 3   Multiple Vitamin (MULTIVITAMIN WITH MINERALS) TABS tablet Take 1 tablet by mouth daily. 90 tablet 1   ondansetron (ZOFRAN) 8 MG tablet Take 1 tablet (8 mg total) by mouth every 8 (eight) hours as needed for nausea or vomiting. 30 tablet 1   pantoprazole (PROTONIX) 40 MG tablet Take 1 tablet (40 mg total) by mouth daily. 30 tablet 1   prochlorperazine (COMPAZINE) 10 MG tablet Take 1 tablet (10 mg total) by mouth every 6 (six) hours as needed for nausea or vomiting. 30 tablet 1   temazepam (RESTORIL) 15 MG capsule Take by mouth.     No current facility-administered medications for this visit.    Review of Systems  Constitutional:  Positive for fatigue. Negative for appetite change, chills, fever and unexpected weight change.  HENT:   Negative for hearing loss and voice change.   Eyes:  Negative for eye problems and icterus.  Respiratory:  Negative for chest tightness, cough and shortness of breath.   Cardiovascular:  Positive for leg swelling. Negative for chest pain.  Gastrointestinal:  Positive for diarrhea. Negative for abdominal distention and abdominal pain.  Endocrine: Negative for hot flashes.  Genitourinary:  Negative for difficulty urinating, dysuria and frequency.   Musculoskeletal:  Negative for arthralgias.  Skin:  Negative for itching and  rash.  Neurological:  Negative for light-headedness and numbness.  Hematological:  Negative for adenopathy. Does not bruise/bleed easily.  Psychiatric/Behavioral:  Positive for sleep disturbance.  Negative for confusion.     PHYSICAL EXAMINATION: Vitals:   10/03/23 0901  BP: (!) 144/96  Pulse: 89  Resp: 20  Temp: 97.6 F (36.4 C)  SpO2: 100%   Filed Weights   10/03/23 0901  Weight: 162 lb (73.5 kg)    Physical Exam Constitutional:      General: He is not in acute distress.    Appearance: He is obese.  HENT:     Head: Normocephalic and atraumatic.  Eyes:     General: No scleral icterus. Cardiovascular:     Rate and Rhythm: Normal rate and regular rhythm.  Pulmonary:     Effort: Pulmonary effort is normal. No respiratory distress.     Comments: Crackles at the base of lung  Abdominal:     General: Bowel sounds are normal. There is no distension.     Palpations: Abdomen is soft.  Musculoskeletal:        General: No deformity. Normal range of motion.     Cervical back: Normal range of motion and neck supple.     Right lower leg: Edema present.     Left lower leg: Edema present.  Skin:    General: Skin is warm and dry.     Findings: No erythema or rash.  Neurological:     Mental Status: He is alert and oriented to person, place, and time. Mental status is at baseline.     Cranial Nerves: No cranial nerve deficit.  Psychiatric:        Mood and Affect: Mood normal.      LABORATORY DATA:  I have reviewed the data as listed    Latest Ref Rng & Units 10/03/2023    8:44 AM 09/26/2023    8:09 AM 09/12/2023    8:07 AM  CBC  WBC 4.0 - 10.5 K/uL 7.5  5.0  7.0   Hemoglobin 13.0 - 17.0 g/dL 47.4  9.4  9.2   Hematocrit 39.0 - 52.0 % 33.3  28.5  27.2   Platelets 150 - 400 K/uL 210  252  44       Latest Ref Rng & Units 10/03/2023    8:44 AM 09/26/2023    8:09 AM 09/08/2023    1:55 PM  CMP  Glucose 70 - 99 mg/dL 259  563  875   BUN 8 - 23 mg/dL 23  20  30    Creatinine  0.61 - 1.24 mg/dL 6.43  3.29  5.18   Sodium 135 - 145 mmol/L 136  135  135   Potassium 3.5 - 5.1 mmol/L 4.3  4.3  3.5   Chloride 98 - 111 mmol/L 100  99  100   CO2 22 - 32 mmol/L 25  27  24    Calcium 8.9 - 10.3 mg/dL 9.0  9.3  7.8   Total Protein 6.5 - 8.1 g/dL 7.8  6.9  6.2   Total Bilirubin 0.0 - 1.2 mg/dL 0.6  0.7  0.9   Alkaline Phos 38 - 126 U/L 87  95  64   AST 15 - 41 U/L 17  26  14    ALT 0 - 44 U/L 22  34  34    Lab Results  Component Value Date   IRON 100 03/14/2023   TIBC 272 03/14/2023   IRONPCTSAT 37 03/14/2023   FERRITIN 249 03/14/2023     RADIOGRAPHIC STUDIES: I have personally reviewed the radiological images as listed and agreed with the findings in the report. No  results found.

## 2023-10-03 NOTE — Assessment & Plan Note (Signed)
 Anemia due to chemotherapy. There maybe a component of anemia of CKD.  Monitor.

## 2023-10-03 NOTE — Assessment & Plan Note (Addendum)
Bone marrow biopsy was reviewed. 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 Cycle 3 D15 Daratumumab was held due to cytopenia and being on anticoagulation.  S/p 4 cycles  Daratumumab and Velcade with revlimid 10mg  2 weeks on 1 week off  Cycle 4 D15 missed due to hospitalization due to ileus. BM biopsy at [facility] after 4 cycles showed 50-60% plasma cells.  Labs are reviewed and discussed with patient.  [facility]. Recommend additional 3-4 cycles.  Hold off cycle 7 Dara Rvd  Revlimid 10mg  daily 2 weeks on 1 week off due to diarrhea.  His myeloma labs are stable, however it has not dramatically improved. He has difficulties tolerating intensive treatment.  Continue monitor disease status.   Continue Acyclovir, At risk of thrombosis, on Eliquis 5mg  BID for A fib Xegeva monthly-    Recommend calcium and vitamin D supplementation.   Follow up with [facility] in the future evaluation for BM transplant - no amyloidosis on cardiac MRI

## 2023-10-03 NOTE — Assessment & Plan Note (Addendum)
No DVT on US Echo LVEF 50% -55%. He has cardiology appt  Recommend Lasix 20mg  daily PRN for edema - he takes Daily.

## 2023-10-03 NOTE — Assessment & Plan Note (Signed)
Could be due to diuretics use.  Follow up with nutritionist.

## 2023-10-03 NOTE — Assessment & Plan Note (Signed)
Encourage oral hydration and avoid nephrotoxins.   

## 2023-10-03 NOTE — Assessment & Plan Note (Signed)
 Chemotherapy plan as listed above. - hold treatment for now due to recent Ileus. pending decision of BM transplant

## 2023-10-03 NOTE — Assessment & Plan Note (Addendum)
Check C diff and GI panel.  If negative. I will send anti diarrhea medication.   Addendum Stool GI panel and C Diff are negative.  Recommend imodium PRN, I have discussed the instruction with patient and wife.

## 2023-10-03 NOTE — Assessment & Plan Note (Signed)
 Recommend patient to take calcium 1200mg  daily. Take vitamin D supplementation.

## 2023-10-03 NOTE — Addendum Note (Signed)
Addended by: [name] on: 10/03/2023 08:16 PM   Modules accepted: Orders

## 2023-10-04 ENCOUNTER — Telehealth: Payer: Self-pay

## 2023-10-04 ENCOUNTER — Encounter: Payer: Self-pay | Admitting: Oncology

## 2023-10-04 ENCOUNTER — Other Ambulatory Visit: Payer: Self-pay

## 2023-10-04 NOTE — Telephone Encounter (Signed)
-----   Message from [name] sent at 10/03/2023  8:21 PM EST ----- Please let patient know that his stool tests are negative.  I have sent Imodium Rx to him. I have discussed about the instruction during his visit today. Please ask him to call us if he has questions about how to use imodium.

## 2023-10-04 NOTE — Telephone Encounter (Signed)
Spoke to pt and notified him of results. Reviewed Imodium directions with pt once again.

## 2023-10-06 ENCOUNTER — Encounter: Payer: Self-pay | Admitting: Cardiology

## 2023-10-06 ENCOUNTER — Ambulatory Visit: Payer: Medicare HMO

## 2023-10-06 ENCOUNTER — Ambulatory Visit: Payer: Medicare HMO | Attending: Cardiology | Admitting: Cardiology

## 2023-10-06 VITALS — BP 140/90 | HR 92 | Ht 67.0 in | Wt 167.2 lb

## 2023-10-06 DIAGNOSIS — I10 Essential (primary) hypertension: Secondary | ICD-10-CM

## 2023-10-06 DIAGNOSIS — I4819 Other persistent atrial fibrillation: Secondary | ICD-10-CM | POA: Diagnosis not present

## 2023-10-06 DIAGNOSIS — C9000 Multiple myeloma not having achieved remission: Secondary | ICD-10-CM | POA: Diagnosis not present

## 2023-10-06 MED ORDER — METOPROLOL SUCCINATE ER 50 MG PO TB24: 50.0000 mg | ORAL_TABLET | Freq: Two times a day (BID) | ORAL | 3 refills | Status: DC

## 2023-10-06 NOTE — Patient Instructions (Signed)
Medication Instructions:  Increase Metoprolol 50 mg twice daily   *If you need a refill on your cardiac medications before your next appointment, please call your pharmacy*   Testing/Procedures: Your physician has requested that you have a Cardioversion. Electrical Cardioversion uses a jolt of electricity to your heart either through paddles or wired patches attached to your chest. This is a controlled, usually prescheduled, procedure. This procedure is done at the hospital and you are not awake during the procedure. You usually go home the day of the procedure. Please see the instruction sheet given to you today for more information.   Follow-Up: At [facility], you and your health needs are our priority.  As part of our continuing mission to provide you with exceptional heart care, we have created designated Provider Care Teams.  These Care Teams include your primary Cardiologist (physician) and Advanced Practice Providers (APPs -  Physician Assistants and Nurse Practitioners) who all work together to provide you with the care you need, when you need it.  We recommend signing up for the patient portal called "MyChart".  Sign up information is provided on this After Visit Summary.  MyChart is used to connect with patients for Virtual Visits (Telemedicine).  Patients are able to view lab/test results, encounter notes, upcoming appointments, etc.  Non-urgent messages can be sent to your provider as well.   To learn more about what you can do with MyChart, go to [email or web address].    Your next appointment:   3 month(s)  Provider:   You may see Debbe Odea, MD or one of the following Advanced Practice Providers on your designated Care Team:   Nicolasa Ducking, NP Eula Listen, PA-C Cadence Fransico Michael, PA-C Charlsie Quest, NP Carlos Levering, NP   Referral sent to EP (Electrophysiology)- they will contact you for an appointment  Other Instructions     Dear Victor Phillips  You are scheduled for a Cardioversion on Monday, February 3rd with Dr. Azucena Cecil.  Please arrive at the [facility] Entrance of [facility], [address], [place] [address] at 11:00 AM (This is 1 hour(s) prior to your procedure time).  Proceed to the Check-In Desk directly inside the entrance.  Procedure Parking: Use the entrance off of the [address] side of the hospital. Turn right upon entering and follow the driveway to parking that is directly in front of the [facility]. There is no valet parking available at this entrance, however there is an awning directly in front of the [facility] Vascular Center for drop off/ pick up for patients.   DIET:  Nothing to eat or drink after midnight except a sip of water with medications (see medication instructions below)  MEDICATION INSTRUCTIONS: !!IF ANY NEW MEDICATIONS ARE STARTED AFTER TODAY, PLEASE NOTIFY YOUR PROVIDER AS SOON AS POSSIBLE!!  FYI: Medications such as Semaglutide (Ozempic, [place]), Tirzepatide (Mounjaro, Zepbound), Dulaglutide (Trulicity), etc ("GLP1 agonists") AND Canagliflozin (Invokana), Dapagliflozin (Farxiga), Empagliflozin (Jardiance), Ertugliflozin (Steglatro), Bexagliflozin [employer]) or any combination with one of these drugs such as Invokamet (Canagliflozin/Metformin), Synjardy (Empagliflozin/Metformin), etc ("SGLT2 inhibitors") must be held around the time of a procedure. This is not a comprehensive list of all of these drugs. Please review all of your medications and talk to your provider if you take any one of these. If you are not sure, ask your provider.     Continue taking your anticoagulant (blood thinner): Apixaban (Eliquis).  You will need to continue this after your procedure until  you are told by your provider that it is safe to stop.    LABS: none needed today   FYI:  For your safety, and to allow [place] to monitor your vital signs accurately during the surgery/procedure we  request: If you have artificial nails, gel coating, SNS etc, please have those removed prior to your surgery/procedure. Not having the nail coverings /polish removed may result in cancellation or delay of your surgery/procedure.  Your support person will be asked to wait in the waiting room during your procedure.  It is OK to have someone drop you off and come back when you are ready to be discharged.  You cannot drive after the procedure and will need someone to drive you home.  Bring your insurance cards.  *Special Note: Every effort is made to have your procedure done on time. Occasionally there are emergencies that occur at the hospital that may cause delays. Please be patient if a delay does occur.

## 2023-10-06 NOTE — Progress Notes (Signed)
Cardiology Office Note:    Date:  10/06/2023   ID:  Victor Phillips, DOB 07-09-1956, MRN [id number]  PCP:  Danella Penton, MD   [facility] Providers Cardiologist:  Debbe Odea, MD     Referring MD: Danella Penton, MD   Chief Complaint  Patient presents with   Follow-up    Patient reports SOBr is improving.      History of Present Illness:    Victor Phillips is a 68 y.o. male with a hx of persistent atrial fibrillation, hypertension, hyperlipidemia, diabetes, multiple myeloma who presents for follow-up.  Seen due to atrial fibrillation.  Previously started on Toprol-XL, Eliquis.  Tolerating medications, no adverse effects.  Endorses occasional fatigue.  Also has multiple myeloma currently being managed by oncology on chemo.  Had chemo induced thrombocytopenia which has resolved with stopping agent.  Denies any bleeding issues with taking Eliquis.  Takes Lasix as needed for edema, edema adequately controlled.  Prior notes/testing. Cardiac MRI 07/2023 EF 50%, no LGE or LV infiltration Echocardiogram 06/2023 EF 50 to 55%   Past Medical History:  Diagnosis Date   Atrial fibrillation (HCC)    Diabetes mellitus without complication (HCC)    Hypercholesteremia    Hypertension    Multiple myeloma not having achieved remission [facility] [place])     Past Surgical History:  Procedure Laterality Date   COLONOSCOPY N/A 02/13/2023   Procedure: COLONOSCOPY;  Surgeon: Toledo, Boykin Nearing, MD;  Location: [facility] ENDOSCOPY;  Service: Gastroenterology;  Laterality: N/A;   COLONOSCOPY N/A 02/14/2023   Procedure: COLONOSCOPY;  Surgeon: Toledo, Boykin Nearing, MD;  Location: [facility] ENDOSCOPY;  Service: Gastroenterology;  Laterality: N/A;   ESOPHAGOGASTRODUODENOSCOPY N/A 02/13/2023   Procedure: ESOPHAGOGASTRODUODENOSCOPY (EGD);  Surgeon: Toledo, Boykin Nearing, MD;  Location: [facility] ENDOSCOPY;  Service: Gastroenterology;  Laterality: N/A;    Current Medications: Current Meds  Medication Sig   acetaminophen  (TYLENOL) 325 MG tablet Take 2 tablets (650 mg total) by mouth every 6 (six) hours as needed for mild pain (or Fever >/= 101).   acyclovir (ZOVIRAX) 400 MG tablet Take 1 tablet (400 mg total) by mouth 2 (two) times daily.   apixaban (ELIQUIS) 5 MG TABS tablet Take 1 tablet (5 mg total) by mouth 2 (two) times daily.   atorvastatin (LIPITOR) 10 MG tablet Take 1 tablet by mouth daily.   calcium carbonate (OS-CAL) 600 MG TABS tablet Take 2 tablets (1,200 mg total) by mouth daily.   cholecalciferol (VITAMIN D3) 25 MCG (1000 UNIT) tablet Take 1 tablet (1,000 Units total) by mouth daily.   furosemide (LASIX) 20 MG tablet Take 1 tablet (20 mg total) by mouth daily as needed for edema or fluid.   glipiZIDE (GLUCOTROL XL) 2.5 MG 24 hr tablet Take by mouth.   lenalidomide (REVLIMID) 10 MG capsule Take 1 capsule (10 mg total) by mouth daily. Take for 14 days, then hold for 7 days off. Repeat every 21 days.   loperamide (IMODIUM) 2 MG capsule Take 1 capsule (2 mg total) by mouth See admin instructions. With onset of diarrhea, take 4 mg,then 2 mg every 2 hours or after every loose bowel movements  maximum: 16 mg/day   Multiple Vitamin (MULTIVITAMIN WITH MINERALS) TABS tablet Take 1 tablet by mouth daily.   ondansetron (ZOFRAN) 8 MG tablet Take 1 tablet (8 mg total) by mouth every 8 (eight) hours as needed for nausea or vomiting.   pantoprazole (PROTONIX) 40 MG tablet Take 1 tablet (40 mg total) by mouth daily.  prochlorperazine (COMPAZINE) 10 MG tablet Take 1 tablet (10 mg total) by mouth every 6 (six) hours as needed for nausea or vomiting.   temazepam (RESTORIL) 15 MG capsule Take by mouth.   [DISCONTINUED] metoprolol succinate (TOPROL-XL) 50 MG 24 hr tablet Take 1 tablet (50 mg total) by mouth daily. Take with or immediately following a meal.     Allergies:   Patient has no known allergies.   Social History   Socioeconomic History   Marital status: Married    Spouse name: Not on file   Number of  children: Not on file   Years of education: Not on file   Highest education level: Not on file  Occupational History   Not on file  Tobacco Use   Smoking status: Never   Smokeless tobacco: Never  Vaping Use   Vaping status: Never Used  Substance and Sexual Activity   Alcohol use: Not Currently    Comment: quit approx 2001   Drug use: No   Sexual activity: Not on file  Other Topics Concern   Not on file  Social History Narrative   Not on file   Social Drivers of Health   Financial Resource Strain: High Risk (10/04/2023)   Received from [facility]   Overall Financial Resource Strain (CARDIA)    Difficulty of Paying Living Expenses: Very hard  Food Insecurity: Food Insecurity Present (10/04/2023)   Received from [facility]   Hunger Vital Sign    Worried About Running Out of Food in the Last Year: Sometimes true    Ran Out of Food in the Last Year: Sometimes true  Transportation Needs: No Transportation Needs (10/04/2023)   Received from [facility] - Transportation    In the past 12 months, has lack of transportation kept you from medical appointments or from getting medications?: No    Lack of Transportation (Non-Medical): No  Physical Activity: Unknown (10/04/2023)   Received from [facility]   Exercise Vital Sign    Days of Exercise per Week: 7 days    Minutes of Exercise per Session: Not on file  Stress: Stress Concern Present (10/04/2023)   Received from [facility] - Occupational Stress Questionnaire    Feeling of Stress : Rather much  Social Connections: Moderately Integrated (10/04/2023)   Received from [facility]   Social Connection and Isolation Panel [NHANES]    Frequency of Communication with Friends and Family: More than three times a week    Frequency of Social Gatherings with Friends and Family: Once  a week    Attends Religious Services: More than 4 times per year    Active Member of [employer] or Organizations: No    Attends Banker Meetings: Never    Marital Status: Married     Family History: The patient's family history includes Diabetes in his mother; Heart attack in his father.  ROS:   Please see the history of present illness.     All other systems reviewed and are negative.  EKGs/Labs/Other Studies Reviewed:    The following studies were reviewed today:  EKG Interpretation Date/Time:  Thursday October 06 2023 08:51:42 EST Ventricular Rate:  92 PR Interval:    QRS Duration:  88 QT Interval:  342 QTC Calculation: 422 R Axis:   16  Text Interpretation: Atrial fibrillation Abnormal QRS-T angle, consider primary T wave abnormality  Confirmed by Debbe Odea ([id number]) on 10/06/2023 9:09:16 AM    Recent Labs: 02/11/2023: B Natriuretic Peptide 267.2 07/15/2023: Magnesium 3.1 10/03/2023: ALT 22; BUN 23; Creatinine 0.89; Hemoglobin 11.2; Platelet Count 210; Potassium 4.3; Sodium 136  Recent Lipid Panel    Component Value Date/Time   CHOL 61 02/13/2023 0329   TRIG 74 02/13/2023 0329   HDL 24 (L) 02/13/2023 0329   CHOLHDL 2.5 02/13/2023 0329   VLDL 15 02/13/2023 0329   LDLCALC 22 02/13/2023 0329     Risk Assessment/Calculations:         Physical Exam:    VS:  BP (!) 140/90 (BP Location: Left Arm, Patient Position: Sitting, Cuff Size: Normal)   Pulse 92   Ht 5\' 7"  (1.702 m)   Wt 167 lb 3.2 oz (75.8 kg)   SpO2 98%   BMI 26.19 kg/m     Wt Readings from Last 3 Encounters:  10/06/23 167 lb 3.2 oz (75.8 kg)  10/03/23 162 lb (73.5 kg)  09/26/23 177 lb 1.6 oz (80.3 kg)     GEN:  Well nourished, well developed in no acute distress HEENT: Normal NECK: No JVD; No carotid bruits CARDIAC: Irregular irregular RESPIRATORY:  Clear to auscultation without rales, wheezing or rhonchi  ABDOMEN: Soft, non-tender, non-distended MUSCULOSKELETAL:  No edema; No  deformity  SKIN: Warm and dry NEUROLOGIC:  Alert and oriented x 3 PSYCHIATRIC:  Normal affect   ASSESSMENT:    1. Persistent atrial fibrillation (HCC)   2. Primary hypertension   3. Multiple myeloma, remission status unspecified (HCC)    PLAN:    In order of problems listed above:  Persistent A-fib,  EKG showing A-fib, clinically asymptomatic.  CMR 07/2023 EF 50%, no LGE, no evidence for infiltrative disease.  Increase Toprol-XL to 50 mg twice daily, continue Eliquis 5 mg twice daily, has not missed Eliquis dose over the last 6 to 8 weeks.  Echo 10/24 EF 50 to 55%, normal LA size.  Plan DC cardioversion, refer to A-fib clinic. Hypertension, BP slightly elevated.  Increase Toprol-XL to 50 mg twice daily. Multiple myeloma, management as per oncology.  Follow-up in 3 months.     Informed Consent   Shared Decision Making/Informed Consent The risks (stroke, cardiac arrhythmias rarely resulting in the need for a temporary or permanent pacemaker, skin irritation or burns and complications associated with conscious sedation including aspiration, arrhythmia, respiratory failure and death), benefits (restoration of normal sinus rhythm) and alternatives of a direct current cardioversion were explained in detail to Victor Phillips and he agrees to proceed.       Medication Adjustments/Labs and Tests Ordered: Current medicines are reviewed at length with the patient today.  Concerns regarding medicines are outlined above.  Orders Placed This Encounter  Procedures   Ambulatory referral to Cardiac Electrophysiology   EKG 12-Lead   Meds ordered this encounter  Medications   metoprolol succinate (TOPROL-XL) 50 MG 24 hr tablet    Sig: Take 1 tablet (50 mg total) by mouth 2 (two) times daily. Take with or immediately following a meal.    Dispense:  180 tablet    Refill:  3    Patient Instructions  Medication Instructions:  Increase Metoprolol 50 mg twice daily   *If you need a refill on your  cardiac medications before your next appointment, please call your pharmacy*   Testing/Procedures: Your physician has requested that you have a Cardioversion. Electrical Cardioversion uses a jolt of electricity to your heart either through paddles or  wired patches attached to your chest. This is a controlled, usually prescheduled, procedure. This procedure is done at the hospital and you are not awake during the procedure. You usually go home the day of the procedure. Please see the instruction sheet given to you today for more information.   Follow-Up: At [facility], you and your health needs are our priority.  As part of our continuing mission to provide you with exceptional heart care, we have created designated Provider Care Teams.  These Care Teams include your primary Cardiologist (physician) and Advanced Practice Providers (APPs -  Physician Assistants and Nurse Practitioners) who all work together to provide you with the care you need, when you need it.  We recommend signing up for the patient portal called "MyChart".  Sign up information is provided on this After Visit Summary.  MyChart is used to connect with patients for Virtual Visits (Telemedicine).  Patients are able to view lab/test results, encounter notes, upcoming appointments, etc.  Non-urgent messages can be sent to your provider as well.   To learn more about what you can do with MyChart, go to [email or web address].    Your next appointment:   3 month(s)  Provider:   You may see Debbe Odea, MD or one of the following Advanced Practice Providers on your designated Care Team:   Nicolasa Ducking, NP Eula Listen, PA-C Cadence Fransico Michael, PA-C Charlsie Quest, NP Carlos Levering, NP   Referral sent to EP (Electrophysiology)- they will contact you for an appointment  Other Instructions     Dear Victor Phillips  You are scheduled for a Cardioversion on Monday, February 3rd with Dr. Azucena Cecil.  Please  arrive at the [facility] Entrance of [facility], [address] [place], [place] [address] at 11:00 AM (This is 1 hour(s) prior to your procedure time).  Proceed to the Check-In Desk directly inside the entrance.  Procedure Parking: Use the entrance off of the [address] side of the hospital. Turn right upon entering and follow the driveway to parking that is directly in front of the [facility]. There is no valet parking available at this entrance, however there is an awning directly in front of the Heart & Vascular Center for drop off/ pick up for patients.   DIET:  Nothing to eat or drink after midnight except a sip of water with medications (see medication instructions below)  MEDICATION INSTRUCTIONS: !!IF ANY NEW MEDICATIONS ARE STARTED AFTER TODAY, PLEASE NOTIFY YOUR PROVIDER AS SOON AS POSSIBLE!!  FYI: Medications such as Semaglutide (Ozempic, [place]), Tirzepatide (Mounjaro, Zepbound), Dulaglutide (Trulicity), etc ("GLP1 agonists") AND Canagliflozin (Invokana), Dapagliflozin (Farxiga), Empagliflozin (Jardiance), Ertugliflozin (Steglatro), Bexagliflozin [employer]) or any combination with one of these drugs such as Invokamet (Canagliflozin/Metformin), Synjardy (Empagliflozin/Metformin), etc ("SGLT2 inhibitors") must be held around the time of a procedure. This is not a comprehensive list of all of these drugs. Please review all of your medications and talk to your provider if you take any one of these. If you are not sure, ask your provider.     Continue taking your anticoagulant (blood thinner): Apixaban (Eliquis).  You will need to continue this after your procedure until you are told by your provider that it is safe to stop.    LABS: none needed today   FYI:  For your safety, and to allow [place] to monitor your vital signs accurately during the surgery/procedure we request: If you have artificial nails, gel coating, SNS etc, please have those removed  prior to your  surgery/procedure. Not having the nail coverings /polish removed may result in cancellation or delay of your surgery/procedure.  Your support person will be asked to wait in the waiting room during your procedure.  It is OK to have someone drop you off and come back when you are ready to be discharged.  You cannot drive after the procedure and will need someone to drive you home.  Bring your insurance cards.  *Special Note: Every effort is made to have your procedure done on time. Occasionally there are emergencies that occur at the hospital that may cause delays. Please be patient if a delay does occur.             Signed, Debbe Odea, MD  10/06/2023 9:51 AM    [facility]

## 2023-10-06 NOTE — H&P (View-Only) (Signed)
 Cardiology Office Note:    Date:  10/06/2023   ID:  Victor Phillips, DOB 07-09-1956, MRN [id number]  PCP:  Danella Penton, MD   [facility] Providers Cardiologist:  Debbe Odea, MD     Referring MD: Danella Penton, MD   Chief Complaint  Patient presents with   Follow-up    Patient reports SOBr is improving.      History of Present Illness:    Victor Phillips is a 68 y.o. male with a hx of persistent atrial fibrillation, hypertension, hyperlipidemia, diabetes, multiple myeloma who presents for follow-up.  Seen due to atrial fibrillation.  Previously started on Toprol-XL, Eliquis.  Tolerating medications, no adverse effects.  Endorses occasional fatigue.  Also has multiple myeloma currently being managed by oncology on chemo.  Had chemo induced thrombocytopenia which has resolved with stopping agent.  Denies any bleeding issues with taking Eliquis.  Takes Lasix as needed for edema, edema adequately controlled.  Prior notes/testing. Cardiac MRI 07/2023 EF 50%, no LGE or LV infiltration Echocardiogram 06/2023 EF 50 to 55%   Past Medical History:  Diagnosis Date   Atrial fibrillation (HCC)    Diabetes mellitus without complication (HCC)    Hypercholesteremia    Hypertension    Multiple myeloma not having achieved remission [facility] [place])     Past Surgical History:  Procedure Laterality Date   COLONOSCOPY N/A 02/13/2023   Procedure: COLONOSCOPY;  Surgeon: Toledo, Boykin Nearing, MD;  Location: [facility] ENDOSCOPY;  Service: Gastroenterology;  Laterality: N/A;   COLONOSCOPY N/A 02/14/2023   Procedure: COLONOSCOPY;  Surgeon: Toledo, Boykin Nearing, MD;  Location: [facility] ENDOSCOPY;  Service: Gastroenterology;  Laterality: N/A;   ESOPHAGOGASTRODUODENOSCOPY N/A 02/13/2023   Procedure: ESOPHAGOGASTRODUODENOSCOPY (EGD);  Surgeon: Toledo, Boykin Nearing, MD;  Location: [facility] ENDOSCOPY;  Service: Gastroenterology;  Laterality: N/A;    Current Medications: Current Meds  Medication Sig   acetaminophen  (TYLENOL) 325 MG tablet Take 2 tablets (650 mg total) by mouth every 6 (six) hours as needed for mild pain (or Fever >/= 101).   acyclovir (ZOVIRAX) 400 MG tablet Take 1 tablet (400 mg total) by mouth 2 (two) times daily.   apixaban (ELIQUIS) 5 MG TABS tablet Take 1 tablet (5 mg total) by mouth 2 (two) times daily.   atorvastatin (LIPITOR) 10 MG tablet Take 1 tablet by mouth daily.   calcium carbonate (OS-CAL) 600 MG TABS tablet Take 2 tablets (1,200 mg total) by mouth daily.   cholecalciferol (VITAMIN D3) 25 MCG (1000 UNIT) tablet Take 1 tablet (1,000 Units total) by mouth daily.   furosemide (LASIX) 20 MG tablet Take 1 tablet (20 mg total) by mouth daily as needed for edema or fluid.   glipiZIDE (GLUCOTROL XL) 2.5 MG 24 hr tablet Take by mouth.   lenalidomide (REVLIMID) 10 MG capsule Take 1 capsule (10 mg total) by mouth daily. Take for 14 days, then hold for 7 days off. Repeat every 21 days.   loperamide (IMODIUM) 2 MG capsule Take 1 capsule (2 mg total) by mouth See admin instructions. With onset of diarrhea, take 4 mg,then 2 mg every 2 hours or after every loose bowel movements  maximum: 16 mg/day   Multiple Vitamin (MULTIVITAMIN WITH MINERALS) TABS tablet Take 1 tablet by mouth daily.   ondansetron (ZOFRAN) 8 MG tablet Take 1 tablet (8 mg total) by mouth every 8 (eight) hours as needed for nausea or vomiting.   pantoprazole (PROTONIX) 40 MG tablet Take 1 tablet (40 mg total) by mouth daily.  prochlorperazine (COMPAZINE) 10 MG tablet Take 1 tablet (10 mg total) by mouth every 6 (six) hours as needed for nausea or vomiting.   temazepam (RESTORIL) 15 MG capsule Take by mouth.   [DISCONTINUED] metoprolol succinate (TOPROL-XL) 50 MG 24 hr tablet Take 1 tablet (50 mg total) by mouth daily. Take with or immediately following a meal.     Allergies:   Patient has no known allergies.   Social History   Socioeconomic History   Marital status: Married    Spouse name: Not on file   Number of  children: Not on file   Years of education: Not on file   Highest education level: Not on file  Occupational History   Not on file  Tobacco Use   Smoking status: Never   Smokeless tobacco: Never  Vaping Use   Vaping status: Never Used  Substance and Sexual Activity   Alcohol use: Not Currently    Comment: quit approx 2001   Drug use: No   Sexual activity: Not on file  Other Topics Concern   Not on file  Social History Narrative   Not on file   Social Drivers of Health   Financial Resource Strain: High Risk (10/04/2023)   Received from [facility]   Overall Financial Resource Strain (CARDIA)    Difficulty of Paying Living Expenses: Very hard  Food Insecurity: Food Insecurity Present (10/04/2023)   Received from [facility]   Hunger Vital Sign    Worried About Running Out of Food in the Last Year: Sometimes true    Ran Out of Food in the Last Year: Sometimes true  Transportation Needs: No Transportation Needs (10/04/2023)   Received from [facility] - Transportation    In the past 12 months, has lack of transportation kept you from medical appointments or from getting medications?: No    Lack of Transportation (Non-Medical): No  Physical Activity: Unknown (10/04/2023)   Received from [facility]   Exercise Vital Sign    Days of Exercise per Week: 7 days    Minutes of Exercise per Session: Not on file  Stress: Stress Concern Present (10/04/2023)   Received from [facility] - Occupational Stress Questionnaire    Feeling of Stress : Rather much  Social Connections: Moderately Integrated (10/04/2023)   Received from [facility]   Social Connection and Isolation Panel [NHANES]    Frequency of Communication with Friends and Family: More than three times a week    Frequency of Social Gatherings with Friends and Family: Once  a week    Attends Religious Services: More than 4 times per year    Active Member of [employer] or Organizations: No    Attends Banker Meetings: Never    Marital Status: Married     Family History: The patient's family history includes Diabetes in his mother; Heart attack in his father.  ROS:   Please see the history of present illness.     All other systems reviewed and are negative.  EKGs/Labs/Other Studies Reviewed:    The following studies were reviewed today:  EKG Interpretation Date/Time:  Thursday October 06 2023 08:51:42 EST Ventricular Rate:  92 PR Interval:    QRS Duration:  88 QT Interval:  342 QTC Calculation: 422 R Axis:   16  Text Interpretation: Atrial fibrillation Abnormal QRS-T angle, consider primary T wave abnormality  Confirmed by Debbe Odea ([id number]) on 10/06/2023 9:09:16 AM    Recent Labs: 02/11/2023: B Natriuretic Peptide 267.2 07/15/2023: Magnesium 3.1 10/03/2023: ALT 22; BUN 23; Creatinine 0.89; Hemoglobin 11.2; Platelet Count 210; Potassium 4.3; Sodium 136  Recent Lipid Panel    Component Value Date/Time   CHOL 61 02/13/2023 0329   TRIG 74 02/13/2023 0329   HDL 24 (L) 02/13/2023 0329   CHOLHDL 2.5 02/13/2023 0329   VLDL 15 02/13/2023 0329   LDLCALC 22 02/13/2023 0329     Risk Assessment/Calculations:         Physical Exam:    VS:  BP (!) 140/90 (BP Location: Left Arm, Patient Position: Sitting, Cuff Size: Normal)   Pulse 92   Ht 5\' 7"  (1.702 m)   Wt 167 lb 3.2 oz (75.8 kg)   SpO2 98%   BMI 26.19 kg/m     Wt Readings from Last 3 Encounters:  10/06/23 167 lb 3.2 oz (75.8 kg)  10/03/23 162 lb (73.5 kg)  09/26/23 177 lb 1.6 oz (80.3 kg)     GEN:  Well nourished, well developed in no acute distress HEENT: Normal NECK: No JVD; No carotid bruits CARDIAC: Irregular irregular RESPIRATORY:  Clear to auscultation without rales, wheezing or rhonchi  ABDOMEN: Soft, non-tender, non-distended MUSCULOSKELETAL:  No edema; No  deformity  SKIN: Warm and dry NEUROLOGIC:  Alert and oriented x 3 PSYCHIATRIC:  Normal affect   ASSESSMENT:    1. Persistent atrial fibrillation (HCC)   2. Primary hypertension   3. Multiple myeloma, remission status unspecified (HCC)    PLAN:    In order of problems listed above:  Persistent A-fib,  EKG showing A-fib, clinically asymptomatic.  CMR 07/2023 EF 50%, no LGE, no evidence for infiltrative disease.  Increase Toprol-XL to 50 mg twice daily, continue Eliquis 5 mg twice daily, has not missed Eliquis dose over the last 6 to 8 weeks.  Echo 10/24 EF 50 to 55%, normal LA size.  Plan DC cardioversion, refer to A-fib clinic. Hypertension, BP slightly elevated.  Increase Toprol-XL to 50 mg twice daily. Multiple myeloma, management as per oncology.  Follow-up in 3 months.     Informed Consent   Shared Decision Making/Informed Consent The risks (stroke, cardiac arrhythmias rarely resulting in the need for a temporary or permanent pacemaker, skin irritation or burns and complications associated with conscious sedation including aspiration, arrhythmia, respiratory failure and death), benefits (restoration of normal sinus rhythm) and alternatives of a direct current cardioversion were explained in detail to Mr. Verhoeven and he agrees to proceed.       Medication Adjustments/Labs and Tests Ordered: Current medicines are reviewed at length with the patient today.  Concerns regarding medicines are outlined above.  Orders Placed This Encounter  Procedures   Ambulatory referral to Cardiac Electrophysiology   EKG 12-Lead   Meds ordered this encounter  Medications   metoprolol succinate (TOPROL-XL) 50 MG 24 hr tablet    Sig: Take 1 tablet (50 mg total) by mouth 2 (two) times daily. Take with or immediately following a meal.    Dispense:  180 tablet    Refill:  3    Patient Instructions  Medication Instructions:  Increase Metoprolol 50 mg twice daily   *If you need a refill on your  cardiac medications before your next appointment, please call your pharmacy*   Testing/Procedures: Your physician has requested that you have a Cardioversion. Electrical Cardioversion uses a jolt of electricity to your heart either through paddles or  wired patches attached to your chest. This is a controlled, usually prescheduled, procedure. This procedure is done at the hospital and you are not awake during the procedure. You usually go home the day of the procedure. Please see the instruction sheet given to you today for more information.   Follow-Up: At [facility], you and your health needs are our priority.  As part of our continuing mission to provide you with exceptional heart care, we have created designated Provider Care Teams.  These Care Teams include your primary Cardiologist (physician) and Advanced Practice Providers (APPs -  Physician Assistants and Nurse Practitioners) who all work together to provide you with the care you need, when you need it.  We recommend signing up for the patient portal called "MyChart".  Sign up information is provided on this After Visit Summary.  MyChart is used to connect with patients for Virtual Visits (Telemedicine).  Patients are able to view lab/test results, encounter notes, upcoming appointments, etc.  Non-urgent messages can be sent to your provider as well.   To learn more about what you can do with MyChart, go to [email or web address].    Your next appointment:   3 month(s)  Provider:   You may see Debbe Odea, MD or one of the following Advanced Practice Providers on your designated Care Team:   Nicolasa Ducking, NP Eula Listen, PA-C Cadence Fransico Michael, PA-C Charlsie Quest, NP Carlos Levering, NP   Referral sent to EP (Electrophysiology)- they will contact you for an appointment  Other Instructions     Dear SHOTARO LEWY  You are scheduled for a Cardioversion on Monday, February 3rd with Dr. Azucena Cecil.  Please  arrive at the [facility] Entrance of [facility], [address] [place], [place] [address] at 11:00 AM (This is 1 hour(s) prior to your procedure time).  Proceed to the Check-In Desk directly inside the entrance.  Procedure Parking: Use the entrance off of the [address] side of the hospital. Turn right upon entering and follow the driveway to parking that is directly in front of the Heart [facility] Vascular Center. There is no valet parking available at this entrance, however there is an awning directly in front of the Heart & Vascular Center for drop off/ pick up for patients.   DIET:  Nothing to eat or drink after midnight except a sip of water with medications (see medication instructions below)  MEDICATION INSTRUCTIONS: !!IF ANY NEW MEDICATIONS ARE STARTED AFTER TODAY, PLEASE NOTIFY YOUR PROVIDER AS SOON AS POSSIBLE!!  FYI: Medications such as Semaglutide (Ozempic, [place]), Tirzepatide (Mounjaro, Zepbound), Dulaglutide (Trulicity), etc ("GLP1 agonists") AND Canagliflozin (Invokana), Dapagliflozin (Farxiga), Empagliflozin (Jardiance), Ertugliflozin (Steglatro), Bexagliflozin [employer]) or any combination with one of these drugs such as Invokamet (Canagliflozin/Metformin), Synjardy (Empagliflozin/Metformin), etc ("SGLT2 inhibitors") must be held around the time of a procedure. This is not a comprehensive list of all of these drugs. Please review all of your medications and talk to your provider if you take any one of these. If you are not sure, ask your provider.     Continue taking your anticoagulant (blood thinner): Apixaban (Eliquis).  You will need to continue this after your procedure until you are told by your provider that it is safe to stop.    LABS: none needed today   FYI:  For your safety, and to allow [place] to monitor your vital signs accurately during the surgery/procedure we request: If you have artificial nails, gel coating, SNS etc, please have those removed  prior to your  surgery/procedure. Not having the nail coverings /polish removed may result in cancellation or delay of your surgery/procedure.  Your support person will be asked to wait in the waiting room during your procedure.  It is OK to have someone drop you off and come back when you are ready to be discharged.  You cannot drive after the procedure and will need someone to drive you home.  Bring your insurance cards.  *Special Note: Every effort is made to have your procedure done on time. Occasionally there are emergencies that occur at the hospital that may cause delays. Please be patient if a delay does occur.             Signed, Debbe Odea, MD  10/06/2023 9:51 AM    [facility]

## 2023-10-07 ENCOUNTER — Other Ambulatory Visit: Payer: Self-pay

## 2023-10-07 ENCOUNTER — Other Ambulatory Visit: Payer: Self-pay | Admitting: Oncology

## 2023-10-07 DIAGNOSIS — C9000 Multiple myeloma not having achieved remission: Secondary | ICD-10-CM

## 2023-10-10 ENCOUNTER — Inpatient Hospital Stay (HOSPITAL_BASED_OUTPATIENT_CLINIC_OR_DEPARTMENT_OTHER): Payer: Medicare HMO | Admitting: Oncology

## 2023-10-10 ENCOUNTER — Other Ambulatory Visit: Payer: Medicare HMO

## 2023-10-10 ENCOUNTER — Inpatient Hospital Stay: Payer: Medicare HMO

## 2023-10-10 ENCOUNTER — Ambulatory Visit: Payer: Medicare HMO

## 2023-10-10 ENCOUNTER — Encounter: Payer: Self-pay | Admitting: Oncology

## 2023-10-10 VITALS — BP 134/91 | HR 90 | Temp 96.0°F | Resp 18 | Wt 161.9 lb

## 2023-10-10 VITALS — BP 127/86 | HR 63 | Temp 96.3°F | Resp 16

## 2023-10-10 DIAGNOSIS — N1832 Chronic kidney disease, stage 3b: Secondary | ICD-10-CM | POA: Diagnosis not present

## 2023-10-10 DIAGNOSIS — D6481 Anemia due to antineoplastic chemotherapy: Secondary | ICD-10-CM

## 2023-10-10 DIAGNOSIS — I4891 Unspecified atrial fibrillation: Secondary | ICD-10-CM | POA: Diagnosis not present

## 2023-10-10 DIAGNOSIS — Z5112 Encounter for antineoplastic immunotherapy: Secondary | ICD-10-CM | POA: Diagnosis not present

## 2023-10-10 DIAGNOSIS — T451X5A Adverse effect of antineoplastic and immunosuppressive drugs, initial encounter: Secondary | ICD-10-CM

## 2023-10-10 DIAGNOSIS — C9000 Multiple myeloma not having achieved remission: Secondary | ICD-10-CM

## 2023-10-10 DIAGNOSIS — Z5111 Encounter for antineoplastic chemotherapy: Secondary | ICD-10-CM

## 2023-10-10 DIAGNOSIS — R197 Diarrhea, unspecified: Secondary | ICD-10-CM

## 2023-10-10 LAB — CBC WITH DIFFERENTIAL (CANCER CENTER ONLY)
Abs Immature Granulocytes: 0.03 10*3/uL (ref 0.00–0.07)
Basophils Absolute: 0 10*3/uL (ref 0.0–0.1)
Basophils Relative: 1 %
Eosinophils Absolute: 0.6 10*3/uL — ABNORMAL HIGH (ref 0.0–0.5)
Eosinophils Relative: 7 %
HCT: 33.4 % — ABNORMAL LOW (ref 39.0–52.0)
Hemoglobin: 11.4 g/dL — ABNORMAL LOW (ref 13.0–17.0)
Immature Granulocytes: 0 %
Lymphocytes Relative: 14 %
Lymphs Abs: 1.1 10*3/uL (ref 0.7–4.0)
MCH: 32.8 pg (ref 26.0–34.0)
MCHC: 34.1 g/dL (ref 30.0–36.0)
MCV: 96 fL (ref 80.0–100.0)
Monocytes Absolute: 0.5 10*3/uL (ref 0.1–1.0)
Monocytes Relative: 6 %
Neutro Abs: 5.5 10*3/uL (ref 1.7–7.7)
Neutrophils Relative %: 72 %
Platelet Count: 173 10*3/uL (ref 150–400)
RBC: 3.48 MIL/uL — ABNORMAL LOW (ref 4.22–5.81)
RDW: 14.8 % (ref 11.5–15.5)
WBC Count: 7.7 10*3/uL (ref 4.0–10.5)
nRBC: 0 % (ref 0.0–0.2)

## 2023-10-10 LAB — CMP (CANCER CENTER ONLY)
ALT: 23 U/L (ref 0–44)
AST: 18 U/L (ref 15–41)
Albumin: 3.8 g/dL (ref 3.5–5.0)
Alkaline Phosphatase: 92 U/L (ref 38–126)
Anion gap: 9 (ref 5–15)
BUN: 24 mg/dL — ABNORMAL HIGH (ref 8–23)
CO2: 26 mmol/L (ref 22–32)
Calcium: 9.6 mg/dL (ref 8.9–10.3)
Chloride: 101 mmol/L (ref 98–111)
Creatinine: 0.82 mg/dL (ref 0.61–1.24)
GFR, Estimated: >60 mL/min (ref >60)
Glucose, Bld: 215 mg/dL — ABNORMAL HIGH (ref 70–99)
Potassium: 4.4 mmol/L (ref 3.5–5.1)
Sodium: 136 mmol/L (ref 135–145)
Total Bilirubin: 0.6 mg/dL (ref 0.0–1.2)
Total Protein: 7.8 g/dL (ref 6.5–8.1)

## 2023-10-10 MED ORDER — ACETAMINOPHEN 325 MG PO TABS
650.0000 mg | ORAL_TABLET | Freq: Once | ORAL | Status: AC
  Administered 2023-10-10: 650 mg via ORAL
  Filled 2023-10-10: qty 2

## 2023-10-10 MED ORDER — DIPHENHYDRAMINE HCL 25 MG PO CAPS
50.0000 mg | ORAL_CAPSULE | Freq: Once | ORAL | Status: AC
  Administered 2023-10-10: 50 mg via ORAL
  Filled 2023-10-10: qty 2

## 2023-10-10 MED ORDER — BORTEZOMIB CHEMO SQ INJECTION 3.5 MG (2.5MG/ML)
1.3000 mg/m2 | Freq: Once | INTRAMUSCULAR | Status: AC
  Administered 2023-10-10: 2.5 mg via SUBCUTANEOUS
  Filled 2023-10-10: qty 1

## 2023-10-10 MED ORDER — DENOSUMAB 120 MG/1.7ML ~~LOC~~ SOLN
120.0000 mg | Freq: Once | SUBCUTANEOUS | Status: AC
  Administered 2023-10-10: 120 mg via SUBCUTANEOUS
  Filled 2023-10-10: qty 1.7

## 2023-10-10 MED ORDER — DEXAMETHASONE 4 MG PO TABS
20.0000 mg | ORAL_TABLET | Freq: Once | ORAL | Status: AC
  Administered 2023-10-10: 20 mg via ORAL
  Filled 2023-10-10: qty 5

## 2023-10-10 MED ORDER — DARATUMUMAB-HYALURONIDASE-FIHJ 1800-30000 MG-UT/15ML ~~LOC~~ SOLN
1800.0000 mg | Freq: Once | SUBCUTANEOUS | Status: AC
  Administered 2023-10-10: 1800 mg via SUBCUTANEOUS
  Filled 2023-10-10: qty 15

## 2023-10-10 NOTE — Assessment & Plan Note (Signed)
Echo was done on 06/14/23 LVEF 50% Follow up with cardiology for evaluation.  Continue Eliquis 5mg  BID

## 2023-10-10 NOTE — Assessment & Plan Note (Signed)
Bone marrow biopsy was reviewed. 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 Cycle 3 D15 Daratumumab was held due to cytopenia and being on anticoagulation.  S/p 4 cycles  Daratumumab and Velcade with revlimid 10mg  2 weeks on 1 week off  Cycle 4 D15 missed due to hospitalization due to ileus. BM biopsy at [facility] after 4 cycles showed 50-60% plasma cells.  Labs are reviewed and discussed with patient.  [facility]. Recommend additional 3-4 cycles.  Proceed with cycle 7 Dara Rvd  Revlimid 10mg  daily 2 weeks on 1 week off   Continue monitor disease status.   Continue Acyclovir, At risk of thrombosis, on Eliquis 5mg  BID for A fib Xegeva monthly-    Recommend calcium and vitamin D supplementation.   Follow up with [facility] in the future evaluation for BM transplant - no amyloidosis on cardiac MRI

## 2023-10-10 NOTE — Assessment & Plan Note (Signed)
Anemia due to chemotherapy. There maybe a component of anemia of CKD.  Monitor.

## 2023-10-10 NOTE — Patient Instructions (Signed)
[facility] - A DEPT OF [facility]  Discharge Instructions: Thank you for choosing [facility] to provide your oncology and hematology care.  If you have a lab appointment with the Cancer Center, please go directly to the Cancer Center and check in at the registration area.  Wear comfortable clothing and clothing appropriate for easy access to any Portacath or PICC line.   We strive to give you quality time with your provider. You may need to reschedule your appointment if you arrive late (15 or more minutes).  Arriving late affects you and other patients whose appointments are after yours.  Also, if you miss three or more appointments without notifying the office, you may be dismissed from the clinic at the provider's discretion.      For prescription refill requests, have your pharmacy contact our office and allow 72 hours for refills to be completed.    Today you received the following chemotherapy and/or immunotherapy agents Darzalex, Velcade      To help prevent nausea and vomiting after your treatment, we encourage you to take your nausea medication as directed.  BELOW ARE SYMPTOMS THAT SHOULD BE REPORTED IMMEDIATELY: *FEVER GREATER THAN 100.4 F (38 C) OR HIGHER *CHILLS OR SWEATING *NAUSEA AND VOMITING THAT IS NOT CONTROLLED WITH YOUR NAUSEA MEDICATION *UNUSUAL SHORTNESS OF BREATH *UNUSUAL BRUISING OR BLEEDING *URINARY PROBLEMS (pain or burning when urinating, or frequent urination) *BOWEL PROBLEMS (unusual diarrhea, constipation, pain near the anus) TENDERNESS IN MOUTH AND THROAT WITH OR WITHOUT PRESENCE OF ULCERS (sore throat, sores in mouth, or a toothache) UNUSUAL RASH, SWELLING OR PAIN  UNUSUAL VAGINAL DISCHARGE OR ITCHING   Items with * indicate a potential emergency and should be followed up as soon as possible or go to the Emergency Department if any problems should occur.  Please show the CHEMOTHERAPY ALERT CARD or  IMMUNOTHERAPY ALERT CARD at check-in to the Emergency Department and triage nurse.  Should you have questions after your visit or need to cancel or reschedule your appointment, please contact [facility] - A DEPT OF [facility]  [phone] and follow the prompts.  Office hours are 8:00 a.m. to 4:30 p.m. Monday - Friday. Please note that voicemails left after 4:00 p.m. may not be returned until the following business day.  We are closed weekends and major holidays. You have access to a nurse at all times for urgent questions. Please call the main number to the clinic [phone] and follow the prompts.  For any non-urgent questions, you may also contact your provider using MyChart. We now offer e-Visits for anyone 87 and older to request care online for non-urgent symptoms. For details visit [email or web address].   Also download the MyChart app! Go to the app store, search "MyChart", open the app, select [place], and log in with your MyChart username and password.

## 2023-10-10 NOTE — Assessment & Plan Note (Signed)
Encourage oral hydration and avoid nephrotoxins.

## 2023-10-10 NOTE — Progress Notes (Signed)
Hematology/Oncology Progress note [phone] [phone]         Patient Care Team: Danella Penton, MD as PCP - General (Internal Medicine) Debbe Odea, MD as PCP - Cardiology (Cardiology) [name], MD as Consulting Physician (Oncology)  CHIEF COMPLAINTS/REASON FOR VISIT:  Multiple myeloma   ASSESSMENT & PLAN:   Cancer Staging  Multiple myeloma [facility]) Staging form: Plasma Cell Myeloma and Plasma Cell Disorders, AJCC 8th Edition - Clinical stage from 04/14/2023: RISS Stage II (Beta-2-microglobulin (mg/L): 8.4, Albumin (g/dL): 2.6, ISS: Stage III, High-risk cytogenetics: Absent, LDH: Normal) - Signed by [name], MD on 04/22/2023   Multiple myeloma (HCC) Bone marrow biopsy was reviewed. 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 Cycle 3 D15 Daratumumab was held due to cytopenia and being on anticoagulation.  S/p 4 cycles  Daratumumab and Velcade with revlimid 10mg  2 weeks on 1 week off  Cycle 4 D15 missed due to hospitalization due to ileus. BM biopsy at [facility] after 4 cycles showed 50-60% plasma cells.  Labs are reviewed and discussed with patient.  [facility]. Recommend additional 3-4 cycles.  Proceed with cycle 7 Dara Rvd  Revlimid 10mg  daily 2 weeks on 1 week off   Continue monitor disease status.   Continue Acyclovir, At risk of thrombosis, on Eliquis 5mg  BID for A fib Xegeva monthly-    Recommend calcium and vitamin D supplementation.   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 There maybe a component of anemia of CKD.  Monitor.   Atrial fibrillation (HCC) Echo was done on 06/14/23 LVEF 50% Follow up with cardiology for evaluation.  Continue Eliquis 5mg  BID   CKD  (chronic kidney disease) stage 3, GFR 30-59 ml/min (HCC) Encourage oral hydration and avoid nephrotoxins.    Diarrhea negative C diff and GI panel.  Diarrhea is likely due to chemotherapy and has resolved.  continue imodium PRN, I have discussed the instruction with patient and wife.   Encounter for antineoplastic chemotherapy Chemotherapy plan as listed above. - hold treatment for now due to recent Ileus. pending decision of BM transplant      No orders of the defined types were placed in this encounter.  Follow-up per LOS  We spent sufficient time to discuss many aspect of care, questions were answered to patient's satisfaction.    [name], MD, PhD [facility] Hematology Oncology 10/10/2023     HISTORY OF PRESENTING ILLNESS:  Victor Phillips is a  68 y.o.  male with PMH listed below who was referred to me for anemia  02/11/2023 - 02/14/2023 recent hospitalization due to pneumonia, respiratory failure.  He was found to have a hemoglobin decreased at 6.8, status post PRBC transfusion during admission.  EGD showed gastritis.  Colonoscopy was not remarkable. 02/11/2023 TIBC 221 ferritin 107, iron saturation 16. Patient is currently taking fusion plus Vitamin B12 level in the 300s. His echo showed grade 2 diastolic CHF.  He denies recent chest pain on exertion, shortness of breath on minimal exertion, pre-syncopal episodes, or palpitations He had not noticed any recent bleeding such as epistaxis, hematuria or hematochezia.  He denies over the counter NSAID ingestion.  Oncology History  Multiple myeloma (HCC)  04/14/2023 Initial Diagnosis  Multiple myeloma   03/13/2020 multiple myeloma panel showed M protein 4.3, IgA lambda Free lamda Level 142,-light chain ratio 0.07 04/05/2023 bone marrow biopsy showed hypercellular bone marrow with plasma cell neoplasm.The bone marrow is hypercellular for age with prominent increase in plasma cells representing 65% of all cells in the aspirate  associated with interstitial infiltrates and numerous variably sized aggregates in  the clot and biopsy sections.  The plasma cells display lambda light chain restriction consistent with plasma cell neoplasm.  Normal cytogenetics.  FISH studies pending.    04/14/2023 Cancer Staging   Staging form: Plasma Cell Myeloma and Plasma Cell Disorders, AJCC 8th Edition - Clinical stage from 04/14/2023: RISS Stage II (Beta-2-microglobulin (mg/L): 8.4, Albumin (g/dL): 2.6, ISS: Stage III, High-risk cytogenetics: Absent, LDH: Normal) - Signed by [name], MD on 04/22/2023 Stage prefix: Initial diagnosis Beta 2 microglobulin range (mg/L): Greater than or equal to 5.5 Albumin range (g/dL): Less than 3.5 Cytogenetics: 1q addition, Other mutation   04/14/2023 Imaging   Skeletal survey 1. No lytic lesions or other intrinsic bony abnormality. 2. Borderline cardiomegaly with an interval decrease in size. 3. Diffuse atheromatous arterial calcifications including bilateral carotid artery calcifications, right greater than left   04/22/2023 -  Chemotherapy   Patient is on Treatment Plan : MYELOMA NEWLY DIAGNOSED TRANSPLANT CANDIDATE DaraVRd (Daratumumab SQ) q21d     04/25/2023 Imaging   PET scan showed  1. Two tiny hypermetabolic lucent lesions in the left posteromedial eighth and ninth ribs. No additional evidence of multiple myeloma. 2. Aortic atherosclerosis (ICD10-I70.0). Coronary artery calcification.   07/14/2023 - 07/19/2023 Hospital Admission   Hospitalized due to ileus.    07/25/2023 Bone Marrow Biopsy   A-C. Peripheral blood and bone marrow, (peripheral smear, aspirate smear, touch preparation, clot section, core biopsy):   Persistent plasma cell myeloma, 50-60% lambda restricted plasma cells on core biopsy. Hypercellular bone marrow (70%) with trilineage hematopoiesis. Negative for increased blasts. Negative for significant dysplasia. Negative for amyloid deposition.     A Fib. Lowe extremity edema  bilaterally, and SOB with exertion.  on Eliquis 5mg  BID, follows up with cardiology. - he has a cardiac procedure coming up next week Denies chest pain, abdominal pain, nausea, vomiting + Fatigue + bilateral lower extremity edema, he takes Lasix 20mg  daily PRN and edema has improved.  Diarrhea has stopped after utilizing imodium PRN as instructed.    MEDICAL HISTORY:  Past Medical History:  Diagnosis Date   Atrial fibrillation (HCC)    Diabetes mellitus without complication (HCC)    Hypercholesteremia    Hypertension    Multiple myeloma not having achieved remission [facility])     SURGICAL HISTORY: Past Surgical History:  Procedure Laterality Date   COLONOSCOPY N/A 02/13/2023   Procedure: COLONOSCOPY;  Surgeon: Toledo, Boykin Nearing, MD;  Location: [facility] ENDOSCOPY;  Service: Gastroenterology;  Laterality: N/A;   COLONOSCOPY N/A 02/14/2023   Procedure: COLONOSCOPY;  Surgeon: Toledo, Boykin Nearing, MD;  Location: [facility] ENDOSCOPY;  Service: Gastroenterology;  Laterality: N/A;   ESOPHAGOGASTRODUODENOSCOPY N/A 02/13/2023   Procedure: ESOPHAGOGASTRODUODENOSCOPY (EGD);  Surgeon: Toledo, Boykin Nearing, MD;  Location: [facility] ENDOSCOPY;  Service: Gastroenterology;  Laterality: N/A;    SOCIAL HISTORY: Social History   Socioeconomic History   Marital status: Married    Spouse name: Not on file   Number of children: Not on file   Years of education: Not on file   Highest education level: Not on file  Occupational History   Not on file  Tobacco Use  Smoking status: Never   Smokeless tobacco: Never  Vaping Use   Vaping status: Never Used  Substance and Sexual Activity   Alcohol use: Not Currently    Comment: quit approx 2001   Drug use: No   Sexual activity: Not on file  Other Topics Concern   Not on file  Social History Narrative   Not on file   Social Drivers of Health   Financial Resource Strain: High Risk (10/04/2023)   Received from [facility]   Overall Financial Resource  Strain (CARDIA)    Difficulty of Paying Living Expenses: Very hard  Food Insecurity: Food Insecurity Present (10/04/2023)   Received from [facility]   Hunger Vital Sign    Worried About Running Out of Food in the Last Year: Sometimes true    Ran Out of Food in the Last Year: Sometimes true  Transportation Needs: No Transportation Needs (10/04/2023)   Received from [facility] - Transportation    In the past 12 months, has lack of transportation kept you from medical appointments or from getting medications?: No    Lack of Transportation (Non-Medical): No  Physical Activity: Unknown (10/04/2023)   Received from [facility]   Exercise Vital Sign    Days of Exercise per Week: 7 days    Minutes of Exercise per Session: Not on file  Stress: Stress Concern Present (10/04/2023)   Received from [facility] - Occupational Stress Questionnaire    Feeling of Stress : Rather much  Social Connections: Moderately Integrated (10/04/2023)   Received from [facility]   Social Connection and Isolation Panel [NHANES]    Frequency of Communication with Friends and Family: More than three times a week    Frequency of Social Gatherings with Friends and Family: Once a week    Attends Religious Services: More than 4 times per year    Active Member of [employer] or Organizations: No    Attends Banker Meetings: Never    Marital Status: Married  Catering manager Violence: Not At Risk (07/14/2023)   Humiliation, Afraid, Rape, and Kick questionnaire    Fear of Current or Ex-Partner: No    Emotionally Abused: No    Physically Abused: No    Sexually Abused: No    FAMILY HISTORY: Family History  Problem Relation Age of Onset   Diabetes Mother    Heart attack Father     ALLERGIES:  has no known allergies.  MEDICATIONS:  Current Outpatient Medications   Medication Sig Dispense Refill   acetaminophen (TYLENOL) 325 MG tablet Take 2 tablets (650 mg total) by mouth every 6 (six) hours as needed for mild pain (or Fever >/= 101). 90 tablet 1   acyclovir (ZOVIRAX) 400 MG tablet Take 1 tablet (400 mg total) by mouth 2 (two) times daily. 60 tablet 5   apixaban (ELIQUIS) 5 MG TABS tablet Take 1 tablet (5 mg total) by mouth 2 (two) times daily. 180 tablet 0   atorvastatin (LIPITOR) 10 MG tablet Take 1 tablet by mouth daily.     calcium carbonate (OS-CAL) 600 MG TABS tablet Take 2 tablets (1,200 mg total) by mouth daily.     cholecalciferol (VITAMIN D3) 25 MCG (1000 UNIT) tablet Take 1 tablet (1,000 Units total) by mouth daily.     furosemide (LASIX) 20 MG tablet Take 1 tablet (20 mg total) by mouth  daily as needed for edema or fluid. 90 tablet 0   glipiZIDE (GLUCOTROL XL) 2.5 MG 24 hr tablet Take by mouth.     lenalidomide (REVLIMID) 10 MG capsule Take 1 capsule (10 mg total) by mouth daily. Take for 14 days, then hold for 7 days off. Repeat every 21 days. 14 capsule 0   loperamide (IMODIUM) 2 MG capsule Take 1 capsule (2 mg total) by mouth See admin instructions. With onset of diarrhea, take 4 mg,then 2 mg every 2 hours or after every loose bowel movements  maximum: 16 mg/day 60 capsule 2   metoprolol succinate (TOPROL-XL) 50 MG 24 hr tablet Take 1 tablet (50 mg total) by mouth 2 (two) times daily. Take with or immediately following a meal. 180 tablet 3   Multiple Vitamin (MULTIVITAMIN WITH MINERALS) TABS tablet Take 1 tablet by mouth daily. 90 tablet 1   ondansetron (ZOFRAN) 8 MG tablet Take 1 tablet (8 mg total) by mouth every 8 (eight) hours as needed for nausea or vomiting. 30 tablet 1   pantoprazole (PROTONIX) 40 MG tablet Take 1 tablet (40 mg total) by mouth daily. 30 tablet 1   prochlorperazine (COMPAZINE) 10 MG tablet Take 1 tablet (10 mg total) by mouth every 6 (six) hours as needed for nausea or vomiting. 30 tablet 1   temazepam (RESTORIL) 15 MG  capsule Take by mouth.     No current facility-administered medications for this visit.   Facility-Administered Medications Ordered in Other Visits  Medication Dose Route Frequency Provider Last Rate Last Admin   bortezomib SQ (VELCADE) chemo injection (2.5mg /mL concentration) 2.5 mg  1.3 mg/m2 (Treatment Plan Recorded) Subcutaneous Once [name], MD       daratumumab-hyaluronidase-fihj [facility] FASPRO) 1800-30000 MG-UT/15ML chemo SQ injection 1,800 mg  1,800 mg Subcutaneous Once [name], MD       denosumab (XGEVA) injection 120 mg  120 mg Subcutaneous Once [name], MD        Review of Systems  Constitutional:  Positive for fatigue. Negative for appetite change, chills, fever and unexpected weight change.  HENT:   Negative for hearing loss and voice change.   Eyes:  Negative for eye problems and icterus.  Respiratory:  Negative for chest tightness, cough and shortness of breath.   Cardiovascular:  Positive for leg swelling. Negative for chest pain.  Gastrointestinal:  Negative for abdominal distention, abdominal pain and diarrhea.  Endocrine: Negative for hot flashes.  Genitourinary:  Negative for difficulty urinating, dysuria and frequency.   Musculoskeletal:  Negative for arthralgias.  Skin:  Negative for itching and rash.  Neurological:  Negative for light-headedness and numbness.  Hematological:  Negative for adenopathy. Does not bruise/bleed easily.  Psychiatric/Behavioral:  Positive for sleep disturbance. Negative for confusion.     PHYSICAL EXAMINATION: Vitals:   10/10/23 0836  BP: (!) 134/91  Pulse: 90  Resp: 18  Temp: (!) 96 F (35.6 C)  SpO2: 100%   Filed Weights   10/10/23 0836  Weight: 161 lb 14.4 oz (73.4 kg)    Physical Exam Constitutional:      General: He is not in acute distress.    Appearance: He is obese.  HENT:     Head: Normocephalic and atraumatic.  Eyes:     General: No scleral icterus. Cardiovascular:     Rate and Rhythm: Normal rate and  regular rhythm.  Pulmonary:     Effort: Pulmonary effort is normal. No respiratory distress.     Comments: Crackles at the  base of lung  Abdominal:     General: Bowel sounds are normal. There is no distension.     Palpations: Abdomen is soft.  Musculoskeletal:        General: No deformity. Normal range of motion.     Cervical back: Normal range of motion and neck supple.     Right lower leg: Edema present.     Left lower leg: Edema present.  Skin:    General: Skin is warm and dry.     Findings: No erythema or rash.  Neurological:     Mental Status: He is alert and oriented to person, place, and time. Mental status is at baseline.     Cranial Nerves: No cranial nerve deficit.  Psychiatric:        Mood and Affect: Mood normal.      LABORATORY DATA:  I have reviewed the data as listed    Latest Ref Rng & Units 10/10/2023    8:01 AM 10/03/2023    8:44 AM 09/26/2023    8:09 AM  CBC  WBC 4.0 - 10.5 K/uL 7.7  7.5  5.0   Hemoglobin 13.0 - 17.0 g/dL 69.6  29.5  9.4   Hematocrit 39.0 - 52.0 % 33.4  33.3  28.5   Platelets 150 - 400 K/uL 173  210  252       Latest Ref Rng & Units 10/10/2023    8:01 AM 10/03/2023    8:44 AM 09/26/2023    8:09 AM  CMP  Glucose 70 - 99 mg/dL 284  132  440   BUN 8 - 23 mg/dL 24  23  20    Creatinine 0.61 - 1.24 mg/dL 1.02  7.25  3.66   Sodium 135 - 145 mmol/L 136  136  135   Potassium 3.5 - 5.1 mmol/L 4.4  4.3  4.3   Chloride 98 - 111 mmol/L 101  100  99   CO2 22 - 32 mmol/L 26  25  27    Calcium 8.9 - 10.3 mg/dL 9.6  9.0  9.3   Total Protein 6.5 - 8.1 g/dL 7.8  7.8  6.9   Total Bilirubin 0.0 - 1.2 mg/dL 0.6  0.6  0.7   Alkaline Phos 38 - 126 U/L 92  87  95   AST 15 - 41 U/L 18  17  26    ALT 0 - 44 U/L 23  22  34    Lab Results  Component Value Date   IRON 100 03/14/2023   TIBC 272 03/14/2023   IRONPCTSAT 37 03/14/2023   FERRITIN 249 03/14/2023     RADIOGRAPHIC STUDIES: I have personally reviewed the radiological images as listed and agreed  with the findings in the report. No results found.

## 2023-10-10 NOTE — Assessment & Plan Note (Signed)
negative C diff and GI panel.  Diarrhea is likely due to chemotherapy and has resolved.  continue imodium PRN, I have discussed the instruction with patient and wife.

## 2023-10-10 NOTE — Assessment & Plan Note (Signed)
Chemotherapy plan as listed above. - hold treatment for now due to recent Ileus. pending decision of BM transplant

## 2023-10-13 ENCOUNTER — Inpatient Hospital Stay: Payer: Medicare HMO

## 2023-10-13 ENCOUNTER — Ambulatory Visit: Payer: Medicare HMO

## 2023-10-13 VITALS — BP 133/96 | HR 54 | Temp 97.3°F | Resp 18 | Wt 162.0 lb

## 2023-10-13 DIAGNOSIS — C9000 Multiple myeloma not having achieved remission: Secondary | ICD-10-CM

## 2023-10-13 DIAGNOSIS — Z5112 Encounter for antineoplastic immunotherapy: Secondary | ICD-10-CM | POA: Diagnosis not present

## 2023-10-13 MED ORDER — BORTEZOMIB CHEMO SQ INJECTION 3.5 MG (2.5MG/ML)
1.3000 mg/m2 | Freq: Once | INTRAMUSCULAR | Status: AC
  Administered 2023-10-13: 2.5 mg via SUBCUTANEOUS
  Filled 2023-10-13: qty 1

## 2023-10-13 MED ORDER — PROCHLORPERAZINE MALEATE 10 MG PO TABS: 10.0000 mg | ORAL_TABLET | Freq: Once | ORAL | Status: DC

## 2023-10-13 NOTE — Patient Instructions (Signed)
[facility] - A DEPT OF [facility]  Discharge Instructions: Thank you for choosing [facility] to provide your oncology and hematology care.  If you have a lab appointment with the Cancer Center, please go directly to the Cancer Center and check in at the registration area.  Wear comfortable clothing and clothing appropriate for easy access to any Portacath or PICC line.   We strive to give you quality time with your provider. You may need to reschedule your appointment if you arrive late (15 or more minutes).  Arriving late affects you and other patients whose appointments are after yours.  Also, if you miss three or more appointments without notifying the office, you may be dismissed from the clinic at the provider's discretion.      For prescription refill requests, have your pharmacy contact our office and allow 72 hours for refills to be completed.     To help prevent nausea and vomiting after your treatment, we encourage you to take your nausea medication as directed.  BELOW ARE SYMPTOMS THAT SHOULD BE REPORTED IMMEDIATELY: *FEVER GREATER THAN 100.4 F (38 C) OR HIGHER *CHILLS OR SWEATING *NAUSEA AND VOMITING THAT IS NOT CONTROLLED WITH YOUR NAUSEA MEDICATION *UNUSUAL SHORTNESS OF BREATH *UNUSUAL BRUISING OR BLEEDING *URINARY PROBLEMS (pain or burning when urinating, or frequent urination) *BOWEL PROBLEMS (unusual diarrhea, constipation, pain near the anus) TENDERNESS IN MOUTH AND THROAT WITH OR WITHOUT PRESENCE OF ULCERS (sore throat, sores in mouth, or a toothache) UNUSUAL RASH, SWELLING OR PAIN   Items with * indicate a potential emergency and should be followed up as soon as possible or go to the Emergency Department if any problems should occur.  Please show the CHEMOTHERAPY ALERT CARD or IMMUNOTHERAPY ALERT CARD at check-in to the Emergency Department and triage nurse.  Should you have questions after your visit or need to  cancel or reschedule your appointment, please contact [facility] - A DEPT OF [facility]igh[facility] Brideg[facility]  [phone] and follow the prompts.  Office hours are 8:00 a.m. to 4:30 p.m. Monday - Friday. Please note that voicemails left after 4:00 p.m. may not be returned until the following business day.  We are closed weekends and major holidays. You have access to a nurse at all times for urgent questions. Please call the main number to the clinic [phone] and follow the prompts.  For any non-urgent questions, you may also contact your provider using MyChart. We now offer e-Visits for anyone 64 and older to request care online for non-urgent symptoms. For details visit [email or web address].   Also download the MyChart app! Go to the app store, search "MyChart", open the app, select [facility], and log in with your MyChart username and password.

## 2023-10-13 NOTE — Progress Notes (Signed)
Nutrition Follow-up:  Patient with multiple myeloma.  Patient receiving daratumaumab-hyaluronidase-fihj, bortezomib.    Met with patient and wife during infusion. Reports that appetite is good.  Ate 2 eggs, bacon, 2 pieces of toast usually for breakfast.  Lunch is pack of nabs.  Dinner is usually meat and couple of sides. Last night ate summer sausage, chicken thigh, potatoes and cabbage.  Drinks ensure shakes at times.  Says that diarrhea is better and none in the last week.    Medications: reviewed  Labs: glucose 215  Anthropometrics:   Weight 162 lb   181 lb on 12/16  Reports fluid weight loss 167 lb on 11/27    NUTRITION DIAGNOSIS: none at this time   INTERVENTION:  Encouraged well balanced diet including lean protein foods and plant foods Continue low sugar shake for added nutrition    MONITORING, EVALUATION, GOAL: weight trends, intake   NEXT VISIT: as needed  Victor Phillips B. Freida Busman, RD, LDN Registered Dietitian [phone]

## 2023-10-16 MED ORDER — SODIUM CHLORIDE 0.9% FLUSH: 3.0000 mL | INTRAVENOUS | Status: DC | PRN

## 2023-10-16 MED ORDER — SODIUM CHLORIDE 0.9% FLUSH: 3.0000 mL | Freq: Two times a day (BID) | INTRAVENOUS | Status: DC

## 2023-10-17 ENCOUNTER — Ambulatory Visit: Payer: Medicare HMO

## 2023-10-17 ENCOUNTER — Other Ambulatory Visit: Payer: Self-pay

## 2023-10-17 ENCOUNTER — Ambulatory Visit: Payer: Medicare HMO | Admitting: Anesthesiology

## 2023-10-17 ENCOUNTER — Other Ambulatory Visit: Payer: Medicare HMO

## 2023-10-17 ENCOUNTER — Encounter
Admission: RE | Disposition: A | Discharge status: Home / Self Care | Payer: Medicare HMO | Source: Home / Self Care | Attending: Cardiology

## 2023-10-17 ENCOUNTER — Ambulatory Visit
Admission: RE | Admit: 2023-10-17 | Discharge: 2023-10-17 | Disposition: A | Discharge status: Home / Self Care | Payer: Medicare HMO | Attending: Cardiology | Admitting: Cardiology

## 2023-10-17 ENCOUNTER — Encounter: Payer: Self-pay | Admitting: Oncology

## 2023-10-17 ENCOUNTER — Encounter: Payer: Self-pay | Admitting: Cardiology

## 2023-10-17 DIAGNOSIS — Z7901 Long term (current) use of anticoagulants: Secondary | ICD-10-CM | POA: Insufficient documentation

## 2023-10-17 DIAGNOSIS — Z5986 Financial insecurity: Secondary | ICD-10-CM | POA: Insufficient documentation

## 2023-10-17 DIAGNOSIS — Z5941 Food insecurity: Secondary | ICD-10-CM | POA: Diagnosis not present

## 2023-10-17 DIAGNOSIS — Z79899 Other long term (current) drug therapy: Secondary | ICD-10-CM | POA: Insufficient documentation

## 2023-10-17 DIAGNOSIS — C9000 Multiple myeloma not having achieved remission: Secondary | ICD-10-CM | POA: Insufficient documentation

## 2023-10-17 DIAGNOSIS — I4819 Other persistent atrial fibrillation: Secondary | ICD-10-CM | POA: Diagnosis not present

## 2023-10-17 DIAGNOSIS — Z7984 Long term (current) use of oral hypoglycemic drugs: Secondary | ICD-10-CM | POA: Insufficient documentation

## 2023-10-17 DIAGNOSIS — E119 Type 2 diabetes mellitus without complications: Secondary | ICD-10-CM | POA: Insufficient documentation

## 2023-10-17 DIAGNOSIS — E785 Hyperlipidemia, unspecified: Secondary | ICD-10-CM | POA: Diagnosis not present

## 2023-10-17 DIAGNOSIS — I10 Essential (primary) hypertension: Secondary | ICD-10-CM | POA: Insufficient documentation

## 2023-10-17 HISTORY — PX: CARDIOVERSION: SHX1299

## 2023-10-17 SURGERY — CARDIOVERSION
Anesthesia: General

## 2023-10-17 MED ORDER — PROPOFOL 10 MG/ML IV BOLUS
INTRAVENOUS | Status: AC
  Filled 2023-10-17: qty 20

## 2023-10-17 MED ORDER — PROPOFOL 1000 MG/100ML IV EMUL
INTRAVENOUS | Status: AC
  Filled 2023-10-17: qty 100

## 2023-10-17 MED ORDER — SODIUM CHLORIDE 0.9 % IV SOLN: INTRAVENOUS | Status: DC | PRN

## 2023-10-17 MED ORDER — PROPOFOL 10 MG/ML IV BOLUS
INTRAVENOUS | Status: DC | PRN
  Administered 2023-10-17: 40 mg via INTRAVENOUS

## 2023-10-17 NOTE — Anesthesia Procedure Notes (Signed)
Procedure Name: MAC Date/Time: 10/17/2023 12:06 PM  Performed by: Hezzie Bump, CRNAPre-anesthesia Checklist: Patient identified, Emergency Drugs available, Suction available and Patient being monitored Patient Re-evaluated:Patient Re-evaluated prior to induction Oxygen Delivery Method: Nasal cannula Induction Type: IV induction Placement Confirmation: positive ETCO2

## 2023-10-17 NOTE — Transfer of Care (Signed)
Immediate Anesthesia Transfer of Care Note  Patient: Victor Phillips  Procedure(s) Performed: CARDIOVERSION  Patient Location: Short Stay  Anesthesia Type:General  Level of Consciousness: drowsy  Airway & Oxygen Therapy: Patient Spontanous Breathing and Patient connected to nasal cannula oxygen  Post-op Assessment: Report given to RN and Post -op Vital signs reviewed and stable  Post vital signs: Reviewed and stable  Last Vitals:  Vitals Value Taken Time  BP    Temp    Pulse 66 10/17/23 1211  Resp 15 10/17/23 1211  SpO2 99 % 10/17/23 1211  Vitals shown include unfiled device data.  Last Pain:  Vitals:   10/17/23 1113  TempSrc: Oral  PainSc: 0-No pain         Complications: No notable events documented.

## 2023-10-17 NOTE — Anesthesia Postprocedure Evaluation (Signed)
Anesthesia Post Note  Patient: Victor Phillips  Procedure(s) Performed: CARDIOVERSION  Patient location during evaluation: Specials Recovery Anesthesia Type: General Level of consciousness: awake and alert Pain management: pain level controlled Vital Signs Assessment: post-procedure vital signs reviewed and stable Respiratory status: spontaneous breathing, nonlabored ventilation, respiratory function stable and patient connected to nasal cannula oxygen Cardiovascular status: blood pressure returned to baseline and stable Postop Assessment: no apparent nausea or vomiting Anesthetic complications: no   No notable events documented.   Last Vitals:  Vitals:   10/17/23 1225 10/17/23 1230  BP: (!) 90/58 91/66  Pulse: 60 (!) 57  Resp: 18 20  Temp:    SpO2: 98% 98%    Last Pain:  Vitals:   10/17/23 1230  TempSrc:   PainSc: 0-No pain                 Corinda Gubler

## 2023-10-17 NOTE — Interval H&P Note (Signed)
History and Physical Interval Note:  10/17/2023 12:11 PM  Victor Phillips  has presented today for surgery, with the diagnosis of persistent  Afib.  The various methods of treatment have been discussed with the patient and family. After consideration of risks, benefits and other options for treatment, the patient has consented to  Procedure(s): CARDIOVERSION (N/A) as a surgical intervention.  The patient's history has been reviewed, patient examined, no change in status, stable for surgery.  I have reviewed the patient's chart and labs.  Questions were answered to the patient's satisfaction.     [name]

## 2023-10-17 NOTE — Anesthesia Preprocedure Evaluation (Signed)
Anesthesia Evaluation  Patient identified by MRN, date of birth, ID band Patient awake    Reviewed: Allergy & Precautions, NPO status , Patient's Chart, lab work & pertinent test results  History of Anesthesia Complications Negative for: history of anesthetic complications  Airway Mallampati: III  TM Distance: >3 FB Neck ROM: full    Dental  (+) Dental Advidsory Given, Poor Dentition, Chipped   Pulmonary shortness of breath (secondary to a fib), pneumonia, neg COPD, neg recent URI   Pulmonary exam normal        Cardiovascular hypertension, On Medications (-) angina (-) CAD, (-) Past MI and (-) Cardiac Stents + dysrhythmias Atrial Fibrillation (-) Valvular Problems/Murmurs  IMPRESSIONS     1. Left ventricular ejection fraction, by estimation, is 60 to 65%. The  left ventricle has normal function. The left ventricle has no regional  wall motion abnormalities. Left ventricular diastolic parameters are  consistent with Grade II diastolic  dysfunction (pseudonormalization). Elevated left ventricular end-diastolic  pressure. The average left ventricular global longitudinal strain is -19.2  %. The global longitudinal strain is normal.   2. Right ventricular systolic function is normal. The right ventricular  size is normal. There is normal pulmonary artery systolic pressure.   3. The mitral valve is normal in structure. Trivial mitral valve  regurgitation. No evidence of mitral stenosis.   4. The aortic valve is tricuspid. Aortic valve regurgitation is not  visualized. No aortic stenosis is present.   5. There is dilatation of the ascending aorta, measuring 38 mm.   6. The inferior vena cava is dilated in size with >50% respiratory  variability, suggesting right atrial pressure of 8 mmHg.     Neuro/Psych negative neurological ROS  negative psych ROS   GI/Hepatic negative GI ROS, Neg liver ROS,,,  Endo/Other  diabetes     Renal/GU Renal disease (AKI)  negative genitourinary   Musculoskeletal   Abdominal   Peds  Hematology  (+) Blood dyscrasia, anemia   Anesthesia Other Findings Past Medical History: No date: Atrial fibrillation (HCC) No date: Diabetes mellitus without complication (HCC) No date: Hypercholesteremia No date: Hypertension No date: Multiple myeloma not having achieved remission (HCC)   Reproductive/Obstetrics negative OB ROS                             Anesthesia Physical Anesthesia Plan  ASA: 3  Anesthesia Plan: General   Post-op Pain Management:    Induction: Intravenous  PONV Risk Score and Plan: Propofol infusion and TIVA  Airway Management Planned: Natural Airway and Nasal Cannula  Additional Equipment:   Intra-op Plan:   Post-operative Plan:   Informed Consent: I have reviewed the patients History and Physical, chart, labs and discussed the procedure including the risks, benefits and alternatives for the proposed anesthesia with the patient or authorized representative who has indicated his/her understanding and acceptance.     Dental Advisory Given  Plan Discussed with: Anesthesiologist, CRNA and Surgeon  Anesthesia Plan Comments: (Patient consented for risks of anesthesia including but not limited to:  - adverse reactions to medications - risk of airway placement if required - damage to eyes, teeth, lips or other oral mucosa - nerve damage due to positioning  - sore throat or hoarseness - Damage to heart, brain, nerves, lungs, other parts of body or loss of life  Patient voiced understanding.)        Anesthesia Quick Evaluation

## 2023-10-17 NOTE — Procedures (Signed)
Cardioversion procedure note For atrial fibrillation.  Procedure Details:  Consent: Risks of procedure as well as the alternatives and risks of each were explained to the (patient/caregiver).  Consent for procedure obtained.  Time Out: Verified patient identification, verified procedure, site/side was marked, verified correct patient position, special equipment/implants available, medications/allergies/relevent history reviewed, required imaging and test results available.  Performed  Patient placed on cardiac monitor, pulse oximetry, supplemental oxygen as necessary.    Pacer pads placed anterior and posterior chest.   Cardioverted 1 time(s).   Cardioverted at  200J. Synchronized biphasic Converted to NSR  Evaluation: Findings: Post procedure EKG shows: NSR Complications: None Patient did tolerate procedure well.  Time Spent Directly with the Patient:  25 minutes   Debbe Odea, M.D.

## 2023-10-18 ENCOUNTER — Inpatient Hospital Stay: Payer: Medicare HMO | Attending: Oncology

## 2023-10-18 ENCOUNTER — Inpatient Hospital Stay (HOSPITAL_BASED_OUTPATIENT_CLINIC_OR_DEPARTMENT_OTHER): Payer: Medicare HMO | Admitting: Oncology

## 2023-10-18 ENCOUNTER — Encounter: Payer: Self-pay | Admitting: Cardiology

## 2023-10-18 ENCOUNTER — Inpatient Hospital Stay: Payer: Medicare HMO

## 2023-10-18 ENCOUNTER — Ambulatory Visit: Payer: Medicare HMO | Admitting: Oncology

## 2023-10-18 VITALS — BP 126/71 | HR 77 | Temp 96.0°F | Resp 18 | Wt 167.3 lb

## 2023-10-18 DIAGNOSIS — I4891 Unspecified atrial fibrillation: Secondary | ICD-10-CM | POA: Diagnosis not present

## 2023-10-18 DIAGNOSIS — Z5112 Encounter for antineoplastic immunotherapy: Secondary | ICD-10-CM | POA: Insufficient documentation

## 2023-10-18 DIAGNOSIS — Z5111 Encounter for antineoplastic chemotherapy: Secondary | ICD-10-CM

## 2023-10-18 DIAGNOSIS — C9000 Multiple myeloma not having achieved remission: Secondary | ICD-10-CM | POA: Diagnosis present

## 2023-10-18 DIAGNOSIS — R197 Diarrhea, unspecified: Secondary | ICD-10-CM

## 2023-10-18 DIAGNOSIS — D801 Nonfamilial hypogammaglobulinemia: Secondary | ICD-10-CM | POA: Insufficient documentation

## 2023-10-18 DIAGNOSIS — T451X5A Adverse effect of antineoplastic and immunosuppressive drugs, initial encounter: Secondary | ICD-10-CM

## 2023-10-18 DIAGNOSIS — Z79899 Other long term (current) drug therapy: Secondary | ICD-10-CM | POA: Insufficient documentation

## 2023-10-18 DIAGNOSIS — D696 Thrombocytopenia, unspecified: Secondary | ICD-10-CM

## 2023-10-18 DIAGNOSIS — D6481 Anemia due to antineoplastic chemotherapy: Secondary | ICD-10-CM

## 2023-10-18 DIAGNOSIS — Z79620 Long term (current) use of immunosuppressive biologic: Secondary | ICD-10-CM | POA: Insufficient documentation

## 2023-10-18 DIAGNOSIS — N1832 Chronic kidney disease, stage 3b: Secondary | ICD-10-CM

## 2023-10-18 LAB — CBC WITH DIFFERENTIAL (CANCER CENTER ONLY)
Abs Immature Granulocytes: 0.14 10*3/uL — ABNORMAL HIGH (ref 0.00–0.07)
Basophils Absolute: 0 10*3/uL (ref 0.0–0.1)
Basophils Relative: 0 %
Eosinophils Absolute: 0.3 10*3/uL (ref 0.0–0.5)
Eosinophils Relative: 4 %
HCT: 30.1 % — ABNORMAL LOW (ref 39.0–52.0)
Hemoglobin: 10.1 g/dL — ABNORMAL LOW (ref 13.0–17.0)
Immature Granulocytes: 2 %
Lymphocytes Relative: 7 %
Lymphs Abs: 0.6 10*3/uL — ABNORMAL LOW (ref 0.7–4.0)
MCH: 33.1 pg (ref 26.0–34.0)
MCHC: 33.6 g/dL (ref 30.0–36.0)
MCV: 98.7 fL (ref 80.0–100.0)
Monocytes Absolute: 0.6 10*3/uL (ref 0.1–1.0)
Monocytes Relative: 7 %
Neutro Abs: 7.4 10*3/uL (ref 1.7–7.7)
Neutrophils Relative %: 80 %
Platelet Count: 94 10*3/uL — ABNORMAL LOW (ref 150–400)
RBC: 3.05 MIL/uL — ABNORMAL LOW (ref 4.22–5.81)
RDW: 15.4 % (ref 11.5–15.5)
WBC Count: 9.1 10*3/uL (ref 4.0–10.5)
nRBC: 0 % (ref 0.0–0.2)

## 2023-10-18 LAB — CMP (CANCER CENTER ONLY)
ALT: 38 U/L (ref 0–44)
AST: 26 U/L (ref 15–41)
Albumin: 3.5 g/dL (ref 3.5–5.0)
Alkaline Phosphatase: 101 U/L (ref 38–126)
Anion gap: 9 (ref 5–15)
BUN: 26 mg/dL — ABNORMAL HIGH (ref 8–23)
CO2: 24 mmol/L (ref 22–32)
Calcium: 8.3 mg/dL — ABNORMAL LOW (ref 8.9–10.3)
Chloride: 104 mmol/L (ref 98–111)
Creatinine: 1.05 mg/dL (ref 0.61–1.24)
GFR, Estimated: >60 mL/min (ref >60)
Glucose, Bld: 195 mg/dL — ABNORMAL HIGH (ref 70–99)
Potassium: 3.8 mmol/L (ref 3.5–5.1)
Sodium: 137 mmol/L (ref 135–145)
Total Bilirubin: 0.7 mg/dL (ref 0.0–1.2)
Total Protein: 7.1 g/dL (ref 6.5–8.1)

## 2023-10-18 MED ORDER — ACETAMINOPHEN 325 MG PO TABS
650.0000 mg | ORAL_TABLET | Freq: Once | ORAL | Status: AC
  Administered 2023-10-18: 650 mg via ORAL
  Filled 2023-10-18: qty 2

## 2023-10-18 MED ORDER — DARATUMUMAB-HYALURONIDASE-FIHJ 1800-30000 MG-UT/15ML ~~LOC~~ SOLN
1800.0000 mg | Freq: Once | SUBCUTANEOUS | Status: AC
  Administered 2023-10-18: 1800 mg via SUBCUTANEOUS
  Filled 2023-10-18: qty 15

## 2023-10-18 MED ORDER — BORTEZOMIB CHEMO SQ INJECTION 3.5 MG (2.5MG/ML)
1.3000 mg/m2 | Freq: Once | INTRAMUSCULAR | Status: AC
  Administered 2023-10-18: 2.5 mg via SUBCUTANEOUS
  Filled 2023-10-18: qty 1

## 2023-10-18 MED ORDER — DEXAMETHASONE 4 MG PO TABS
20.0000 mg | ORAL_TABLET | Freq: Once | ORAL | Status: AC
  Administered 2023-10-18: 20 mg via ORAL
  Filled 2023-10-18: qty 5

## 2023-10-18 MED ORDER — DIPHENHYDRAMINE HCL 25 MG PO CAPS
50.0000 mg | ORAL_CAPSULE | Freq: Once | ORAL | Status: AC
  Administered 2023-10-18: 50 mg via ORAL
  Filled 2023-10-18: qty 2

## 2023-10-18 NOTE — Assessment & Plan Note (Signed)
 Recommend patient to take calcium 1200mg  daily. Take vitamin D supplementation.

## 2023-10-18 NOTE — Assessment & Plan Note (Signed)
 Anemia due to chemotherapy. There maybe a component of anemia of CKD.  Monitor.

## 2023-10-18 NOTE — Assessment & Plan Note (Signed)
Echo was done on 06/14/23 LVEF 50% Follow up with cardiology, s/p ablation.  Continue Eliquis 5mg  BID

## 2023-10-18 NOTE — Assessment & Plan Note (Signed)
 Encourage oral hydration and avoid nephrotoxins.

## 2023-10-18 NOTE — Assessment & Plan Note (Signed)
 Chemotherapy plan as listed above. - hold treatment for now due to recent Ileus. pending decision of BM transplant

## 2023-10-18 NOTE — Progress Notes (Signed)
Hematology/Oncology Progress note [phone] [phone]         Patient Care Team: Danella Penton, MD as PCP - General (Internal Medicine) Debbe Odea, MD as PCP - Cardiology (Cardiology) [name], MD as Consulting Physician (Oncology)  CHIEF COMPLAINTS/REASON FOR VISIT:  Multiple myeloma   ASSESSMENT & PLAN:   Cancer Staging  Multiple myeloma [facility]) Staging form: Plasma Cell Myeloma and Plasma Cell Disorders, AJCC 8th Edition - Clinical stage from 04/14/2023: RISS Stage II (Beta-2-microglobulin (mg/L): 8.4, Albumin (g/dL): 2.6, ISS: Stage III, High-risk cytogenetics: Absent, LDH: Normal) - Signed by [name], MD on 04/22/2023   Multiple myeloma (HCC) Bone marrow biopsy was reviewed. 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 Cycle 3 D15 Daratumumab was held due to cytopenia and being on anticoagulation.  S/p 4 cycles  Daratumumab and Velcade with revlimid 10mg  2 weeks on 1 week off  Cycle 4 D15 missed due to hospitalization due to ileus. BM biopsy at [facility] after 4 cycles showed 50-60% plasma cells.  Labs are reviewed and discussed with patient.  [facility]. Recommend additional 3-4 cycles.  Finish planned course of cycle 7 Dara Rvd  Revlimid 10mg  daily 2 weeks on 1 week off   Continue monitor disease status.   Continue Acyclovir, At risk of thrombosis, on Eliquis 5mg  BID for A fib Xegeva monthly-    Recommend calcium and vitamin D supplementation.   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 There maybe a component of anemia of CKD.  Monitor.   Atrial fibrillation (HCC) Echo was done on 06/14/23 LVEF 50% Follow up with cardiology, s/p ablation.  Continue Eliquis 5mg   BID   CKD (chronic kidney disease) stage 3, GFR 30-59 ml/min (HCC) Encourage oral hydration and avoid nephrotoxins.    Diarrhea Diarrhea is likely due to chemotherapy and has improved with the use of imodium.  continue imodium PRN as directed.   Encounter for antineoplastic chemotherapy Chemotherapy plan as listed above. - hold treatment for now due to recent Ileus. pending decision of BM transplant    Hypocalcemia Recommend patient to take calcium 1200mg  daily. Take vitamin D supplementation.   Thrombocytopenia (HCC) Chemotherapy induced. Monitor closely.      Orders Placed This Encounter  Procedures   Multiple Myeloma Panel (SPEP&IFE w/QIG)    Standing Status:   Future    Expected Date:   11/01/2023    Expiration Date:   10/31/2024   Kappa/lambda light chains    Standing Status:   Future    Expected Date:   11/01/2023    Expiration Date:   10/31/2024   CBC with Differential (Cancer Center Only)    Standing Status:   Future    Expected Date:   11/01/2023    Expiration Date:   10/31/2024   CMP (Cancer Center only)    Standing Status:   Future    Expected Date:   11/01/2023    Expiration Date:   10/31/2024   CBC with Differential (Cancer Center Only)    Standing Status:   Future    Expected Date:   11/08/2023    Expiration Date:   11/07/2024   CMP (Cancer Center only)    Standing Status:   Future  Expected Date:   11/08/2023    Expiration Date:   11/07/2024   CBC with Differential (Cancer Center Only)    Standing Status:   Future    Expected Date:   10/25/2023    Expiration Date:   10/17/2024   Follow-up per LOS  We spent sufficient time to discuss many aspect of care, questions were answered to patient's satisfaction.    [name], MD, PhD [facility] Hematology Oncology 10/18/2023     HISTORY OF PRESENTING ILLNESS:  Victor Phillips is a  68 y.o.  male with PMH listed below who was referred to me for anemia  02/11/2023 - 02/14/2023 recent hospitalization due to  pneumonia, respiratory failure.  He was found to have a hemoglobin decreased at 6.8, status post PRBC transfusion during admission.  EGD showed gastritis.  Colonoscopy was not remarkable. 02/11/2023 TIBC 221 ferritin 107, iron saturation 16. Patient is currently taking fusion plus Vitamin B12 level in the 300s. His echo showed grade 2 diastolic CHF.  He denies recent chest pain on exertion, shortness of breath on minimal exertion, pre-syncopal episodes, or palpitations He had not noticed any recent bleeding such as epistaxis, hematuria or hematochezia.  He denies over the counter NSAID ingestion.  Oncology History  Multiple myeloma (HCC)  04/14/2023 Initial Diagnosis   Multiple myeloma   03/13/2020 multiple myeloma panel showed M protein 4.3, IgA lambda Free lamda Level 142,-light chain ratio 0.07 04/05/2023 bone marrow biopsy showed hypercellular bone marrow with plasma cell neoplasm.The bone marrow is hypercellular for age with prominent increase in plasma cells representing 65% of all cells in the aspirate associated with interstitial infiltrates and numerous variably sized aggregates in  the clot and biopsy sections.  The plasma cells display lambda light chain restriction consistent with plasma cell neoplasm.  Normal cytogenetics.  FISH studies pending.    04/14/2023 Cancer Staging   Staging form: Plasma Cell Myeloma and Plasma Cell Disorders, AJCC 8th Edition - Clinical stage from 04/14/2023: RISS Stage II (Beta-2-microglobulin (mg/L): 8.4, Albumin (g/dL): 2.6, ISS: Stage III, High-risk cytogenetics: Absent, LDH: Normal) - Signed by [name], MD on 04/22/2023 Stage prefix: Initial diagnosis Beta 2 microglobulin range (mg/L): Greater than or equal to 5.5 Albumin range (g/dL): Less than 3.5 Cytogenetics: 1q addition, Other mutation   04/14/2023 Imaging   Skeletal survey 1. No lytic lesions or other intrinsic bony abnormality. 2. Borderline cardiomegaly with an interval decrease in size. 3.  Diffuse atheromatous arterial calcifications including bilateral carotid artery calcifications, right greater than left   04/22/2023 -  Chemotherapy   Patient is on Treatment Plan : MYELOMA NEWLY DIAGNOSED TRANSPLANT CANDIDATE DaraVRd (Daratumumab SQ) q21d     04/25/2023 Imaging   PET scan showed  1. Two tiny hypermetabolic lucent lesions in the left posteromedial eighth and ninth ribs. No additional evidence of multiple myeloma. 2. Aortic atherosclerosis (ICD10-I70.0). Coronary artery calcification.   07/14/2023 - 07/19/2023 Hospital Admission   Hospitalized due to ileus.    07/25/2023 Bone Marrow Biopsy   A-C. Peripheral blood and bone marrow, (peripheral smear, aspirate smear, touch preparation, clot section, core biopsy):   Persistent plasma cell myeloma, 50-60% lambda restricted plasma cells on core biopsy. Hypercellular bone marrow (70%) with trilineage hematopoiesis. Negative for increased blasts. Negative for significant dysplasia. Negative for amyloid deposition.     A Fib. Lowe extremity edema bilaterally, and SOB with exertion.  on Eliquis 5mg  BID, follows up with cardiology. - he has a cardiac procedure coming up next week Denies  chest pain, abdominal pain, nausea, vomiting + Fatigue + bilateral lower extremity edema, he takes Lasix 20mg  daily PRN and edema has improved.  Diarrhea has stopped after utilizing imodium PRN as instructed.    MEDICAL HISTORY:  Past Medical History:  Diagnosis Date   Atrial fibrillation (HCC)    Diabetes mellitus without complication (HCC)    Hypercholesteremia    Hypertension    Multiple myeloma not having achieved remission (HCC)     SURGICAL HISTORY: Past Surgical History:  Procedure Laterality Date   CARDIOVERSION N/A 10/17/2023   Procedure: CARDIOVERSION;  Surgeon: Debbe Odea, MD;  Location: [facility] ORS;  Service: Cardiovascular;  Laterality: N/A;   COLONOSCOPY N/A 02/13/2023   Procedure: COLONOSCOPY;  Surgeon: Toledo, Boykin Nearing, MD;  Location: [facility] ENDOSCOPY;  Service: Gastroenterology;  Laterality: N/A;   COLONOSCOPY N/A 02/14/2023   Procedure: COLONOSCOPY;  Surgeon: Toledo, Boykin Nearing, MD;  Location: [facility] ENDOSCOPY;  Service: Gastroenterology;  Laterality: N/A;   ESOPHAGOGASTRODUODENOSCOPY N/A 02/13/2023   Procedure: ESOPHAGOGASTRODUODENOSCOPY (EGD);  Surgeon: Toledo, Boykin Nearing, MD;  Location: [facility] ENDOSCOPY;  Service: Gastroenterology;  Laterality: N/A;    SOCIAL HISTORY: Social History   Socioeconomic History   Marital status: Married    Spouse name: Not on file   Number of children: Not on file   Years of education: Not on file   Highest education level: Not on file  Occupational History   Not on file  Tobacco Use   Smoking status: Never   Smokeless tobacco: Never  Vaping Use   Vaping status: Never Used  Substance and Sexual Activity   Alcohol use: Not Currently    Comment: quit approx 2001   Drug use: No   Sexual activity: Not on file  Other Topics Concern   Not on file  Social History Narrative   Not on file   Social Drivers of Health   Financial Resource Strain: High Risk (10/12/2023)   Received from [facility]   Overall Financial Resource Strain (CARDIA)    Difficulty of Paying Living Expenses: Very hard  Food Insecurity: Food Insecurity Present (10/12/2023)   Received from [facility]   Hunger Vital Sign    Worried About Running Out of Food in the Last Year: Sometimes true    Ran Out of Food in the Last Year: Sometimes true  Transportation Needs: No Transportation Needs (10/12/2023)   Received from [facility] - Transportation    In the past 12 months, has lack of transportation kept you from medical appointments or from getting medications?: No    Lack of Transportation (Non-Medical): No  Physical Activity: Insufficiently Active (10/12/2023)   Received from [facility]   Exercise Vital Sign    Days of  Exercise per Week: 7 days    Minutes of Exercise per Session: 10 min  Stress: No Stress Concern Present (10/12/2023)   Received from [facility] - Occupational Stress Questionnaire    Feeling of Stress : Not at all  Recent Concern: Stress - Stress Concern Present (10/04/2023)   Received from [facility] - Occupational Stress Questionnaire    Feeling of Stress : Rather much  Social Connections: Moderately Integrated (10/12/2023)   Received from [facility]   Social Connection and Isolation Panel [NHANES]    Frequency of Communication with Friends and Family: More than  three times a week    Frequency of Social Gatherings with Friends and Family: More than three times a week    Attends Religious Services: 1 to 4 times per year    Active Member of [employer] or Organizations: No    Attends Banker Meetings: Never    Marital Status: Married  Catering manager Violence: Not At Risk (07/14/2023)   Humiliation, Afraid, Rape, and Kick questionnaire    Fear of Current or Ex-Partner: No    Emotionally Abused: No    Physically Abused: No    Sexually Abused: No    FAMILY HISTORY: Family History  Problem Relation Age of Onset   Diabetes Mother    Heart attack Father     ALLERGIES:  has no known allergies.  MEDICATIONS:  Current Outpatient Medications  Medication Sig Dispense Refill   acetaminophen (TYLENOL) 325 MG tablet Take 2 tablets (650 mg total) by mouth every 6 (six) hours as needed for mild pain (or Fever >/= 101). 90 tablet 1   acyclovir (ZOVIRAX) 400 MG tablet Take 1 tablet (400 mg total) by mouth 2 (two) times daily. 60 tablet 5   apixaban (ELIQUIS) 5 MG TABS tablet Take 1 tablet (5 mg total) by mouth 2 (two) times daily. 180 tablet 0   atorvastatin (LIPITOR) 10 MG tablet Take 10 mg by mouth daily.     calcium carbonate (OS-CAL) 600 MG  TABS tablet Take 2 tablets (1,200 mg total) by mouth daily.     cholecalciferol (VITAMIN D3) 25 MCG (1000 UNIT) tablet Take 1 tablet (1,000 Units total) by mouth daily.     furosemide (LASIX) 20 MG tablet Take 1 tablet (20 mg total) by mouth daily as needed for edema or fluid. 90 tablet 0   glipiZIDE (GLUCOTROL XL) 2.5 MG 24 hr tablet Take 5 mg by mouth daily with breakfast.     lenalidomide (REVLIMID) 10 MG capsule TAKE 1 CAPSULE BY MOUTH 1 TIME A DAY FOR 14 DAYS ON THEN 7 DAYS OFF, REPEAT EVERY 21 DAYS. 14 capsule 0   loperamide (IMODIUM) 2 MG capsule Take 1 capsule (2 mg total) by mouth See admin instructions. With onset of diarrhea, take 4 mg,then 2 mg every 2 hours or after every loose bowel movements  Victor: 16 mg/day 60 capsule 2   metoprolol succinate (TOPROL-XL) 50 MG 24 hr tablet Take 1 tablet (50 mg total) by mouth 2 (two) times daily. Take with or immediately following a meal. (Patient taking differently: Take 50 mg by mouth daily. Take with or immediately following a meal.) 180 tablet 3   Multiple Vitamin (MULTIVITAMIN WITH MINERALS) TABS tablet Take 1 tablet by mouth daily. 90 tablet 1   ondansetron (ZOFRAN) 8 MG tablet Take 1 tablet (8 mg total) by mouth every 8 (eight) hours as needed for nausea or vomiting. 30 tablet 1   pantoprazole (PROTONIX) 40 MG tablet Take 1 tablet (40 mg total) by mouth daily. 30 tablet 1   prochlorperazine (COMPAZINE) 10 MG tablet Take 1 tablet (10 mg total) by mouth every 6 (six) hours as needed for nausea or vomiting. 30 tablet 1   temazepam (RESTORIL) 15 MG capsule Take 15 mg by mouth at bedtime.     No current facility-administered medications for this visit.   Facility-Administered Medications Ordered in Other Visits  Medication Dose Route Frequency Provider Last Rate Last Admin   acetaminophen (TYLENOL) tablet 650 mg  650 mg Oral Once [name], MD  bortezomib SQ (VELCADE) chemo injection (2.5mg /mL concentration) 2.5 mg  1.3 mg/m2 (Treatment  Plan Recorded) Subcutaneous Once [name], MD       daratumumab-hyaluronidase-fihj [facility] FASPRO) 1800-30000 MG-UT/15ML chemo SQ injection 1,800 mg  1,800 mg Subcutaneous Once [name], MD       dexamethasone (DECADRON) tablet 20 mg  20 mg Oral Once [name], MD       diphenhydrAMINE (BENADRYL) capsule 50 mg  50 mg Oral Once [name], MD        Review of Systems  Constitutional:  Positive for fatigue. Negative for appetite change, chills, fever and unexpected weight change.  HENT:   Negative for hearing loss and voice change.   Eyes:  Negative for eye problems and icterus.  Respiratory:  Negative for chest tightness, cough and shortness of breath.   Cardiovascular:  Positive for leg swelling. Negative for chest pain.  Gastrointestinal:  Negative for abdominal distention, abdominal pain and diarrhea.  Endocrine: Negative for hot flashes.  Genitourinary:  Negative for difficulty urinating, dysuria and frequency.   Musculoskeletal:  Negative for arthralgias.  Skin:  Negative for itching and rash.  Neurological:  Negative for light-headedness and numbness.  Hematological:  Negative for adenopathy. Does not bruise/bleed easily.  Psychiatric/Behavioral:  Positive for sleep disturbance. Negative for confusion.     PHYSICAL EXAMINATION: Vitals:   10/18/23 1031  BP: 126/71  Pulse: 77  Resp: 18  Temp: (!) 96 F (35.6 C)  SpO2: 99%   Filed Weights   10/18/23 1031  Weight: 167 lb 4.8 oz (75.9 kg)    Physical Exam Constitutional:      General: He is not in acute distress.    Appearance: He is obese.  HENT:     Head: Normocephalic and atraumatic.  Eyes:     General: No scleral icterus. Cardiovascular:     Rate and Rhythm: Normal rate and regular rhythm.  Pulmonary:     Effort: Pulmonary effort is normal. No respiratory distress.     Comments: Crackles at the base of lung  Abdominal:     General: Bowel sounds are normal. There is no distension.     Palpations: Abdomen is soft.   Musculoskeletal:        General: No deformity. Normal range of motion.     Cervical back: Normal range of motion and neck supple.     Right lower leg: Edema present.     Left lower leg: Edema present.  Skin:    General: Skin is warm and dry.     Findings: No erythema or rash.  Neurological:     Mental Status: He is alert and oriented to person, place, and time. Mental status is at baseline.     Cranial Nerves: No cranial nerve deficit.  Psychiatric:        Mood and Affect: Mood normal.      LABORATORY DATA:  I have reviewed the data as listed    Latest Ref Rng & Units 10/18/2023   10:16 AM 10/10/2023    8:01 AM 10/03/2023    8:44 AM  CBC  WBC 4.0 - 10.5 K/uL 9.1  7.7  7.5   Hemoglobin 13.0 - 17.0 g/dL 82.9  56.2  13.0   Hematocrit 39.0 - 52.0 % 30.1  33.4  33.3   Platelets 150 - 400 K/uL 94  173  210       Latest Ref Rng & Units 10/18/2023   10:15 AM 10/10/2023  8:01 AM 10/03/2023    8:44 AM  CMP  Glucose 70 - 99 mg/dL 657  846  962   BUN 8 - 23 mg/dL 26  24  23    Creatinine 0.61 - 1.24 mg/dL 9.52  8.41  3.24   Sodium 135 - 145 mmol/L 137  136  136   Potassium 3.5 - 5.1 mmol/L 3.8  4.4  4.3   Chloride 98 - 111 mmol/L 104  101  100   CO2 22 - 32 mmol/L 24  26  25    Calcium 8.9 - 10.3 mg/dL 8.3  9.6  9.0   Total Protein 6.5 - 8.1 g/dL 7.1  7.8  7.8   Total Bilirubin 0.0 - 1.2 mg/dL 0.7  0.6  0.6   Alkaline Phos 38 - 126 U/L 101  92  87   AST 15 - 41 U/L 26  18  17    ALT 0 - 44 U/L 38  23  22    Lab Results  Component Value Date   IRON 100 03/14/2023   TIBC 272 03/14/2023   IRONPCTSAT 37 03/14/2023   FERRITIN 249 03/14/2023     RADIOGRAPHIC STUDIES: I have personally reviewed the radiological images as listed and agreed with the findings in the report. No results found.

## 2023-10-18 NOTE — Assessment & Plan Note (Addendum)
Bone marrow biopsy was reviewed. 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 Cycle 3 D15 Daratumumab was held due to cytopenia and being on anticoagulation.  S/p 4 cycles  Daratumumab and Velcade with revlimid 10mg  2 weeks on 1 week off  Cycle 4 D15 missed due to hospitalization due to ileus. BM biopsy at [facility] after 4 cycles showed 50-60% plasma cells.  Labs are reviewed and discussed with patient.  [facility]. Recommend additional 3-4 cycles.  Finish planned course of cycle 7 Dara Rvd  Revlimid 10mg  daily 2 weeks on 1 week off   Continue monitor disease status.   Continue Acyclovir, At risk of thrombosis, on Eliquis 5mg  BID for A fib Xegeva monthly-    Recommend calcium and vitamin D supplementation.   Follow up with [facility] in the future evaluation for BM transplant - no amyloidosis on cardiac MRI

## 2023-10-18 NOTE — Patient Instructions (Signed)
 [facility] - A DEPT OF [place]  Discharge Instructions: Thank you for choosing [facility] to provide your oncology and hematology care.  If you have a lab appointment with the Cancer Center, please go directly to the Cancer Center and check in at the registration area.  Wear comfortable clothing and clothing appropriate for easy access to any Portacath or PICC line.   We strive to give you quality time with your provider. You may need to reschedule your appointment if you arrive late (15 or more minutes).  Arriving late affects you and other patients whose appointments are after yours.  Also, if you miss three or more appointments without notifying the office, you may be dismissed from the clinic at the provider's discretion.      For prescription refill requests, have your pharmacy contact our office and allow 72 hours for refills to be completed.    Today you received the following chemotherapy and/or immunotherapy agents VELCADE  and DARZALEX       To help prevent nausea and vomiting after your treatment, we encourage you to take your nausea medication as directed.  BELOW ARE SYMPTOMS THAT SHOULD BE REPORTED IMMEDIATELY: *FEVER GREATER THAN 100.4 F (38 C) OR HIGHER *CHILLS OR SWEATING *NAUSEA AND VOMITING THAT IS NOT CONTROLLED WITH YOUR NAUSEA MEDICATION *UNUSUAL SHORTNESS OF BREATH *UNUSUAL BRUISING OR BLEEDING *URINARY PROBLEMS (pain or burning when urinating, or frequent urination) *BOWEL PROBLEMS (unusual diarrhea, constipation, pain near the anus) TENDERNESS IN MOUTH AND THROAT WITH OR WITHOUT PRESENCE OF ULCERS (sore throat, sores in mouth, or a toothache) UNUSUAL RASH, SWELLING OR PAIN  UNUSUAL VAGINAL DISCHARGE OR ITCHING   Items with * indicate a potential emergency and should be followed up as soon as possible or go to the Emergency Department if any problems should occur.  Please show the CHEMOTHERAPY ALERT CARD or  IMMUNOTHERAPY ALERT CARD at check-in to the Emergency Department and triage nurse.  Should you have questions after your visit or need to cancel or reschedule your appointment, please contact [facility] - A DEPT OF [facility]  [phone] and follow the prompts.  Office hours are 8:00 a.m. to 4:30 p.m. Monday - Friday. Please note that voicemails left after 4:00 p.m. may not be returned until the following business day.  We are closed weekends and major holidays. You have access to a nurse at all times for urgent questions. Please call the main number to the clinic [phone] and follow the prompts.  For any non-urgent questions, you may also contact your provider using MyChart. We now offer e-Visits for anyone 59 and older to request care online for non-urgent symptoms. For details visit [email or web address].   Also download the MyChart app! Go to the app store, search MyChart, open the app, select [facility], and log in with your MyChart username and password.  Bortezomib  Injection What is this medication? BORTEZOMIB  (bor TEZ oh mib) treats lymphoma. It may also be used to treat multiple myeloma, a type of bone marrow cancer. It works by blocking a protein that causes cancer cells to grow and multiply. This helps to slow or stop the spread of cancer cells. This medicine may be used for other purposes; ask your health care provider or pharmacist if you have questions. COMMON BRAND NAME(S): BORUZU , Velcade  What should I tell my care team before I take this medication? They need to know if you have  any of these conditions: Dehydration Diabetes Heart disease Liver disease Tingling of the fingers or toes or other nerve disorder An unusual or allergic reaction to bortezomib, other medications, foods, dyes, or preservatives If you or your partner are pregnant or trying to get pregnant Breastfeeding How should I use this medication? This medication is  injected into a vein or under the skin. It is given by your care team in a hospital or clinic setting. Talk to your care team about the use of this medication in children. Special care may be needed. Overdosage: If you think you have taken too much of this medicine contact a poison control center or emergency room at once. NOTE: This medicine is only for you. Do not share this medicine with others. What if I miss a dose? Keep appointments for follow-up doses. It is important not to miss your dose. Call your care team if you are unable to keep an appointment. What may interact with this medication? Ketoconazole Rifampin This list may not describe all possible interactions. Give your health care provider a list of all the medicines, herbs, non-prescription drugs, or dietary supplements you use. Also tell them if you smoke, drink alcohol, or use illegal drugs. Some items may interact with your medicine. What should I watch for while using this medication? Your condition will be monitored carefully while you are receiving this medication. You may need blood work while taking this medication. This medication may affect your coordination, reaction time, or judgment. Do not drive or operate machinery until you know how this medication affects you. Sit up or stand slowly to reduce the risk of dizzy or fainting spells. Drinking alcohol with this medication can increase the risk of these side effects. This medication may increase your risk of getting an infection. Call your care team for advice if you get a fever, chills, sore throat, or other symptoms of a cold or flu. Do not treat yourself. Try to avoid being around people who are sick. Check with your care team if you have severe diarrhea, nausea, and vomiting, or if you sweat a lot. The loss of too much body fluid may make it dangerous for you to take this medication. Talk to your care team if you may be pregnant. Serious birth defects can occur if you take  this medication during pregnancy and for 7 months after the last dose. You will need a negative pregnancy test before starting this medication. Contraception is recommended while taking this medication and for 7 months after the last dose. Your care team can help you find the option that works for you. If your partner can get pregnant, use a condom during sex while taking this medication and for 4 months after the last dose. Do not breastfeed while taking this medication and for 2 months after the last dose. This medication may cause infertility. Talk to your care team if you are concerned about your fertility. What side effects may I notice from receiving this medication? Side effects that you should report to your care team as soon as possible: Allergic reactions--skin rash, itching, hives, swelling of the face, lips, tongue, or throat Bleeding--bloody or black, tar-like stools, vomiting blood or brown material that looks like coffee grounds, red or dark brown urine, small red or purple spots on skin, unusual bruising or bleeding Bleeding in the brain--severe headache, stiff neck, confusion, dizziness, change in vision, numbness or weakness of the face, arm, or leg, trouble speaking, trouble walking, vomiting Bowel blockage--stomach cramping,  unable to have a bowel movement or pass gas, loss of appetite, vomiting Heart failure--shortness of breath, swelling of the ankles, feet, or hands, sudden weight gain, unusual weakness or fatigue Infection--fever, chills, cough, sore throat, wounds that don't heal, pain or trouble when passing urine, general feeling of discomfort or being unwell Liver injury--right upper belly pain, loss of appetite, nausea, light-colored stool, dark yellow or brown urine, yellowing skin or eyes, unusual weakness or fatigue Low blood pressure--dizziness, feeling faint or lightheaded, blurry vision Lung injury--shortness of breath or trouble breathing, cough, spitting up blood,  chest pain, fever Pain, tingling, or numbness in the hands or feet Severe or prolonged diarrhea Stomach pain, bloody diarrhea, pale skin, unusual weakness or fatigue, decrease in the amount of urine, which may be signs of hemolytic uremic syndrome Sudden and severe headache, confusion, change in vision, seizures, which may be signs of posterior reversible encephalopathy syndrome (PRES) TTP--purple spots on the skin or inside the mouth, pale skin, yellowing skin or eyes, unusual weakness or fatigue, fever, fast or irregular heartbeat, confusion, change in vision, trouble speaking, trouble walking Tumor lysis syndrome (TLS)--nausea, vomiting, diarrhea, decrease in the amount of urine, dark urine, unusual weakness or fatigue, confusion, muscle pain or cramps, fast or irregular heartbeat, joint pain Side effects that usually do not require medical attention (report to your care team if they continue or are bothersome): Constipation Diarrhea Fatigue Loss of appetite Nausea This list may not describe all possible side effects. Call your doctor for medical advice about side effects. You may report side effects to FDA at 1-800-FDA-1088. Where should I keep my medication? This medication is given in a hospital or clinic. It will not be stored at home. NOTE: This sheet is a summary. It may not cover all possible information. If you have questions about this medicine, talk to your doctor, pharmacist, or health care provider.  2024 Elsevier/Gold Standard (2022-02-02 00:00:00)  Daratumumab  Injection What is this medication? DARATUMUMAB  (dar a toom ue mab) treats multiple myeloma, a type of bone marrow cancer. It works by helping your immune system slow or stop the spread of cancer cells. It is a monoclonal antibody. This medicine may be used for other purposes; ask your health care provider or pharmacist if you have questions. COMMON BRAND NAME(S): DARZALEX  What should I tell my care team before I take  this medication? They need to know if you have any of these conditions: Hereditary fructose intolerance Infection, such as chickenpox, herpes, hepatitis B Lung or breathing disease, such as asthma, COPD An unusual or allergic reaction to daratumumab , sorbitol, other medications, foods, dyes, or preservatives Pregnant or trying to get pregnant Breastfeeding How should I use this medication? This medication is injected into a vein. It is given by your care team in a hospital or clinic setting. Talk to your care team about the use of this medication in children. Special care may be needed. Overdosage: If you think you have taken too much of this medicine contact a poison control center or emergency room at once. NOTE: This medicine is only for you. Do not share this medicine with others. What if I miss a dose? Keep appointments for follow-up doses. It is important not to miss your dose. Call your care team if you are unable to keep an appointment. What may interact with this medication? Interactions have not been studied. This list may not describe all possible interactions. Give your health care provider a list of all the medicines, herbs, non-prescription  drugs, or dietary supplements you use. Also tell them if you smoke, drink alcohol, or use illegal drugs. Some items may interact with your medicine. What should I watch for while using this medication? Your condition will be monitored carefully while you are receiving this medication. This medication can cause serious allergic reactions. To reduce your risk, your care team may give you other medication to take before receiving this one. Be sure to follow the directions from your care team. This medication can affect the results of blood tests to match your blood type. These changes can last for up to 6 months after the final dose. Your care team will do blood tests to match your blood type before you start treatment. Tell all of your care team  that you are being treated with this medication before receiving a blood transfusion. This medication can affect the results of some tests used to determine treatment response; extra tests may be needed to evaluate response. Talk to your care team if you wish to become pregnant or think you are pregnant. This medication can cause serious birth defects if taken during pregnancy and for 3 months after the last dose. A reliable form of contraception is recommended while taking this medication and for 3 months after the last dose. Talk to your care team about effective forms of contraception. Do not breast-feed while taking this medication. What side effects may I notice from receiving this medication? Side effects that you should report to your care team as soon as possible: Allergic reactions--skin rash, itching, hives, swelling of the face, lips, tongue, or throat Infection--fever, chills, cough, sore throat, wounds that don't heal, pain or trouble when passing urine, general feeling of discomfort or being unwell Infusion reactions--chest pain, shortness of breath or trouble breathing, feeling faint or lightheaded Unusual bruising or bleeding Side effects that usually do not require medical attention (report to your care team if they continue or are bothersome): Constipation Diarrhea Fatigue Nausea Pain, tingling, or numbness in the hands or feet Swelling of the ankles, hands, or feet This list may not describe all possible side effects. Call your doctor for medical advice about side effects. You may report side effects to FDA at 1-800-FDA-1088. Where should I keep my medication? This medication is given in a hospital or clinic. It will not be stored at home. NOTE: This sheet is a summary. It may not cover all possible information. If you have questions about this medicine, talk to your doctor, pharmacist, or health care provider.  2024 Elsevier/Gold Standard (2022-07-08 00:00:00)

## 2023-10-18 NOTE — Assessment & Plan Note (Signed)
Diarrhea is likely due to chemotherapy and has improved with the use of imodium.  continue imodium PRN as directed.

## 2023-10-18 NOTE — Assessment & Plan Note (Signed)
Chemotherapy induced. Monitor closely.

## 2023-10-20 ENCOUNTER — Ambulatory Visit: Payer: Medicare HMO

## 2023-10-21 ENCOUNTER — Inpatient Hospital Stay: Payer: Medicare HMO

## 2023-10-21 VITALS — BP 141/97 | HR 89 | Temp 97.0°F | Resp 18 | Wt 170.9 lb

## 2023-10-21 DIAGNOSIS — Z5112 Encounter for antineoplastic immunotherapy: Secondary | ICD-10-CM | POA: Diagnosis not present

## 2023-10-21 DIAGNOSIS — C9000 Multiple myeloma not having achieved remission: Secondary | ICD-10-CM

## 2023-10-21 MED ORDER — BORTEZOMIB CHEMO SQ INJECTION 3.5 MG (2.5MG/ML)
1.3000 mg/m2 | Freq: Once | INTRAMUSCULAR | Status: AC
  Administered 2023-10-21: 2.5 mg via SUBCUTANEOUS
  Filled 2023-10-21: qty 1

## 2023-10-21 MED ORDER — PROCHLORPERAZINE MALEATE 10 MG PO TABS
10.0000 mg | ORAL_TABLET | Freq: Once | ORAL | Status: AC
  Administered 2023-10-21: 10 mg via ORAL
  Filled 2023-10-21: qty 1

## 2023-10-21 NOTE — Patient Instructions (Signed)
 [facility]  Discharge Instructions: Thank you for choosing [facility] to provide your oncology and hematology care.  If you have a lab appointment with the Cancer Center, please go directly to the Cancer Center and check in at the registration area.  Wear comfortable clothing and clothing appropriate for easy access to any Portacath or PICC line.   We strive to give you quality time with your provider. You may need to reschedule your appointment if you arrive late (15 or more minutes).  Arriving late affects you and other patients whose appointments are after yours.  Also, if you miss three or more appointments without notifying the office, you may be dismissed from the clinic at the provider's discretion.      For prescription refill requests, have your pharmacy contact our office and allow 72 hours for refills to be completed.     To help prevent nausea and vomiting after your treatment, we encourage you to take your nausea medication as directed.  BELOW ARE SYMPTOMS THAT SHOULD BE REPORTED IMMEDIATELY: *FEVER GREATER THAN 100.4 F (38 C) OR HIGHER *CHILLS OR SWEATING *NAUSEA AND VOMITING THAT IS NOT CONTROLLED WITH YOUR NAUSEA MEDICATION *UNUSUAL SHORTNESS OF BREATH *UNUSUAL BRUISING OR BLEEDING *URINARY PROBLEMS (pain or burning when urinating, or frequent urination) *BOWEL PROBLEMS (unusual diarrhea, constipation, pain near the anus) TENDERNESS IN MOUTH AND THROAT WITH OR WITHOUT PRESENCE OF ULCERS (sore throat, sores in mouth, or a toothache) UNUSUAL RASH, SWELLING OR PAIN   Items with * indicate a potential emergency and should be followed up as soon as possible or go to the Emergency Department if any problems should occur.  Please show the CHEMOTHERAPY ALERT CARD or IMMUNOTHERAPY ALERT CARD at check-in to the Emergency Department and triage nurse.  Should you have questions after your visit or need to  cancel or reschedule your appointment, please contact [facility] - A DEPT OF [facility]  [phone] and follow the prompts.  Office hours are 8:00 a.m. to 4:30 p.m. Monday - Friday. Please note that voicemails left after 4:00 p.m. may not be returned until the following business day.  We are closed weekends and major holidays. You have access to a nurse at all times for urgent questions. Please call the main number to the clinic [phone] and follow the prompts.  For any non-urgent questions, you may also contact your provider using MyChart. We now offer e-Visits for anyone 64 and older to request care online for non-urgent symptoms. For details visit [email or web address].   Also download the MyChart app! Go to the app store, search "MyChart", open the app, select [facility], and log in with your MyChart username and password.

## 2023-10-25 ENCOUNTER — Inpatient Hospital Stay: Payer: Medicare HMO

## 2023-10-25 DIAGNOSIS — Z5112 Encounter for antineoplastic immunotherapy: Secondary | ICD-10-CM | POA: Diagnosis not present

## 2023-10-25 DIAGNOSIS — C9000 Multiple myeloma not having achieved remission: Secondary | ICD-10-CM

## 2023-10-25 LAB — CBC WITH DIFFERENTIAL (CANCER CENTER ONLY)
Abs Immature Granulocytes: 0.05 10*3/uL (ref 0.00–0.07)
Basophils Absolute: 0 10*3/uL (ref 0.0–0.1)
Basophils Relative: 0 %
Eosinophils Absolute: 0.1 10*3/uL (ref 0.0–0.5)
Eosinophils Relative: 2 %
HCT: 27.8 % — ABNORMAL LOW (ref 39.0–52.0)
Hemoglobin: 9.1 g/dL — ABNORMAL LOW (ref 13.0–17.0)
Immature Granulocytes: 1 %
Lymphocytes Relative: 4 %
Lymphs Abs: 0.3 10*3/uL — ABNORMAL LOW (ref 0.7–4.0)
MCH: 32.9 pg (ref 26.0–34.0)
MCHC: 32.7 g/dL (ref 30.0–36.0)
MCV: 100.4 fL — ABNORMAL HIGH (ref 80.0–100.0)
Monocytes Absolute: 0.7 10*3/uL (ref 0.1–1.0)
Monocytes Relative: 9 %
Neutro Abs: 6.6 10*3/uL (ref 1.7–7.7)
Neutrophils Relative %: 84 %
Platelet Count: 56 10*3/uL — ABNORMAL LOW (ref 150–400)
RBC: 2.77 MIL/uL — ABNORMAL LOW (ref 4.22–5.81)
RDW: 15.2 % (ref 11.5–15.5)
WBC Count: 7.8 10*3/uL (ref 4.0–10.5)
nRBC: 0 % (ref 0.0–0.2)

## 2023-11-01 ENCOUNTER — Inpatient Hospital Stay: Payer: Medicare HMO

## 2023-11-01 ENCOUNTER — Inpatient Hospital Stay (HOSPITAL_BASED_OUTPATIENT_CLINIC_OR_DEPARTMENT_OTHER): Payer: Medicare HMO | Admitting: Oncology

## 2023-11-01 ENCOUNTER — Encounter: Payer: Self-pay | Admitting: Oncology

## 2023-11-01 VITALS — BP 143/73 | HR 94 | Temp 96.6°F | Resp 18 | Wt 178.1 lb

## 2023-11-01 DIAGNOSIS — D696 Thrombocytopenia, unspecified: Secondary | ICD-10-CM

## 2023-11-01 DIAGNOSIS — D6481 Anemia due to antineoplastic chemotherapy: Secondary | ICD-10-CM

## 2023-11-01 DIAGNOSIS — C9000 Multiple myeloma not having achieved remission: Secondary | ICD-10-CM

## 2023-11-01 DIAGNOSIS — Z5111 Encounter for antineoplastic chemotherapy: Secondary | ICD-10-CM

## 2023-11-01 DIAGNOSIS — I4891 Unspecified atrial fibrillation: Secondary | ICD-10-CM | POA: Diagnosis not present

## 2023-11-01 DIAGNOSIS — N1832 Chronic kidney disease, stage 3b: Secondary | ICD-10-CM | POA: Diagnosis not present

## 2023-11-01 DIAGNOSIS — Z5112 Encounter for antineoplastic immunotherapy: Secondary | ICD-10-CM | POA: Diagnosis not present

## 2023-11-01 DIAGNOSIS — T451X5A Adverse effect of antineoplastic and immunosuppressive drugs, initial encounter: Secondary | ICD-10-CM

## 2023-11-01 DIAGNOSIS — R771 Abnormality of globulin: Secondary | ICD-10-CM | POA: Insufficient documentation

## 2023-11-01 LAB — CBC WITH DIFFERENTIAL (CANCER CENTER ONLY)
Abs Immature Granulocytes: 0.02 10*3/uL (ref 0.00–0.07)
Basophils Absolute: 0 10*3/uL (ref 0.0–0.1)
Basophils Relative: 0 %
Eosinophils Absolute: 0.1 10*3/uL (ref 0.0–0.5)
Eosinophils Relative: 2 %
HCT: 28.8 % — ABNORMAL LOW (ref 39.0–52.0)
Hemoglobin: 9.2 g/dL — ABNORMAL LOW (ref 13.0–17.0)
Immature Granulocytes: 1 %
Lymphocytes Relative: 11 %
Lymphs Abs: 0.4 10*3/uL — ABNORMAL LOW (ref 0.7–4.0)
MCH: 33.1 pg (ref 26.0–34.0)
MCHC: 31.9 g/dL (ref 30.0–36.0)
MCV: 103.6 fL — ABNORMAL HIGH (ref 80.0–100.0)
Monocytes Absolute: 0.4 10*3/uL (ref 0.1–1.0)
Monocytes Relative: 10 %
Neutro Abs: 2.9 10*3/uL (ref 1.7–7.7)
Neutrophils Relative %: 76 %
Platelet Count: 158 10*3/uL (ref 150–400)
RBC: 2.78 MIL/uL — ABNORMAL LOW (ref 4.22–5.81)
RDW: 15.2 % (ref 11.5–15.5)
WBC Count: 3.8 10*3/uL — ABNORMAL LOW (ref 4.0–10.5)
nRBC: 0 % (ref 0.0–0.2)

## 2023-11-01 LAB — CMP (CANCER CENTER ONLY)
ALT: 39 U/L (ref 0–44)
AST: 23 U/L (ref 15–41)
Albumin: 3.6 g/dL (ref 3.5–5.0)
Alkaline Phosphatase: 101 U/L (ref 38–126)
Anion gap: 9 (ref 5–15)
BUN: 28 mg/dL — ABNORMAL HIGH (ref 8–23)
CO2: 22 mmol/L (ref 22–32)
Calcium: 8.4 mg/dL — ABNORMAL LOW (ref 8.9–10.3)
Chloride: 105 mmol/L (ref 98–111)
Creatinine: 0.99 mg/dL (ref 0.61–1.24)
GFR, Estimated: >60 mL/min (ref >60)
Glucose, Bld: 169 mg/dL — ABNORMAL HIGH (ref 70–99)
Potassium: 4.1 mmol/L (ref 3.5–5.1)
Sodium: 136 mmol/L (ref 135–145)
Total Bilirubin: 0.8 mg/dL (ref 0.0–1.2)
Total Protein: 7.5 g/dL (ref 6.5–8.1)

## 2023-11-01 MED ORDER — BORTEZOMIB CHEMO SQ INJECTION 3.5 MG (2.5MG/ML)
1.3000 mg/m2 | Freq: Once | INTRAMUSCULAR | Status: AC
  Administered 2023-11-01: 2.5 mg via SUBCUTANEOUS
  Filled 2023-11-01: qty 1

## 2023-11-01 MED ORDER — DARATUMUMAB-HYALURONIDASE-FIHJ 1800-30000 MG-UT/15ML ~~LOC~~ SOLN
1800.0000 mg | Freq: Once | SUBCUTANEOUS | Status: AC
  Administered 2023-11-01: 1800 mg via SUBCUTANEOUS
  Filled 2023-11-01: qty 15

## 2023-11-01 MED ORDER — ACETAMINOPHEN 325 MG PO TABS
650.0000 mg | ORAL_TABLET | Freq: Once | ORAL | Status: AC
  Administered 2023-11-01: 650 mg via ORAL
  Filled 2023-11-01: qty 2

## 2023-11-01 MED ORDER — DIPHENHYDRAMINE HCL 25 MG PO CAPS
50.0000 mg | ORAL_CAPSULE | Freq: Once | ORAL | Status: AC
  Administered 2023-11-01: 50 mg via ORAL
  Filled 2023-11-01: qty 2

## 2023-11-01 MED ORDER — DEXAMETHASONE 4 MG PO TABS
20.0000 mg | ORAL_TABLET | Freq: Once | ORAL | Status: AC
Start: 2023-11-01 — End: 2023-11-01
  Administered 2023-11-01: 20 mg via ORAL
  Filled 2023-11-01: qty 5

## 2023-11-01 NOTE — Assessment & Plan Note (Signed)
 Chemotherapy plan as listed above. - hold treatment for now due to recent Ileus. pending decision of BM transplant

## 2023-11-01 NOTE — Assessment & Plan Note (Signed)
 Encourage oral hydration and avoid nephrotoxins.

## 2023-11-01 NOTE — Assessment & Plan Note (Signed)
 Echo was done on 06/14/23 LVEF 50% Follow up with cardiology, s/p ablation.  Continue Eliquis 5mg  BID

## 2023-11-01 NOTE — Assessment & Plan Note (Signed)
 Recommend patient to take calcium 1200mg  daily. Take vitamin D supplementation.  Check 25 hydroxy vitamin D level.  Recommend IV calcium and he declined

## 2023-11-01 NOTE — Assessment & Plan Note (Signed)
 Resolved

## 2023-11-01 NOTE — Assessment & Plan Note (Signed)
 Anemia due to chemotherapy. There maybe a component of anemia of CKD.  Monitor.

## 2023-11-01 NOTE — Assessment & Plan Note (Addendum)
 Recommend IVIG 1g/kg x 1  Rationale and side effects were reviewed with patient.  He agrees with the plan.

## 2023-11-01 NOTE — Patient Instructions (Signed)
 [facility]  Discharge Instructions: Thank you for choosing [facility] to provide your oncology and hematology care.  If you have a lab appointment with the Cancer Center, please go directly to the Cancer Center and check in at the registration area.  Wear comfortable clothing and clothing appropriate for easy access to any Portacath or PICC line.   We strive to give you quality time with your provider. You may need to reschedule your appointment if you arrive late (15 or more minutes).  Arriving late affects you and other patients whose appointments are after yours.  Also, if you miss three or more appointments without notifying the office, you may be dismissed from the clinic at the provider's discretion.      For prescription refill requests, have your pharmacy contact our office and allow 72 hours for refills to be completed.    Today you received the following chemotherapy and/or immunotherapy agents- darzalex faspro, velcade      To help prevent nausea and vomiting after your treatment, we encourage you to take your nausea medication as directed.  BELOW ARE SYMPTOMS THAT SHOULD BE REPORTED IMMEDIATELY: *FEVER GREATER THAN 100.4 F (38 C) OR HIGHER *CHILLS OR SWEATING *NAUSEA AND VOMITING THAT IS NOT CONTROLLED WITH YOUR NAUSEA MEDICATION *UNUSUAL SHORTNESS OF BREATH *UNUSUAL BRUISING OR BLEEDING *URINARY PROBLEMS (pain or burning when urinating, or frequent urination) *BOWEL PROBLEMS (unusual diarrhea, constipation, pain near the anus) TENDERNESS IN MOUTH AND THROAT WITH OR WITHOUT PRESENCE OF ULCERS (sore throat, sores in mouth, or a toothache) UNUSUAL RASH, SWELLING OR PAIN  UNUSUAL VAGINAL DISCHARGE OR ITCHING   Items with * indicate a potential emergency and should be followed up as soon as possible or go to the Emergency Department if any problems should occur.  Please show the CHEMOTHERAPY ALERT CARD or  IMMUNOTHERAPY ALERT CARD at check-in to the Emergency Department and triage nurse.  Should you have questions after your visit or need to cancel or reschedule your appointment, please contact [facility] - A DEPT OF [facility]  [phone] and follow the prompts.  Office hours are 8:00 a.m. to 4:30 p.m. Monday - Friday. Please note that voicemails left after 4:00 p.m. may not be returned until the following business day.  We are closed weekends and major holidays. You have access to a nurse at all times for urgent questions. Please call the main number to the clinic [phone] and follow the prompts.  For any non-urgent questions, you may also contact your provider using MyChart. We now offer e-Visits for anyone 56 and older to request care online for non-urgent symptoms. For details visit [email or web address].   Also download the MyChart app! Go to the app store, search "MyChart", open the app, select [place] [facility], and log in with your MyChart username and password.

## 2023-11-01 NOTE — Assessment & Plan Note (Signed)
 Bone marrow biopsy was reviewed. 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 Cycle 3 D15 Daratumumab was held due to cytopenia and being on anticoagulation.  S/p 4 cycles  Daratumumab and Velcade with revlimid 10mg  2 weeks on 1 week off  Cycle 4 D15 missed due to hospitalization due to ileus. BM biopsy at [facility] after 4 cycles showed 50-60% plasma cells.  Labs are reviewed and discussed with patient.  [facility]. Recommend additional 3-4 cycles.  Proceed with cycle 8 D1 Dara Rvd  start Revlimid 10mg  daily 2 weeks on 1 week off -he started on 10/31/23  Continue Acyclovir, At risk of thrombosis, on Eliquis 5mg  BID for A fib Xegeva monthly if calcium meets criteria Recommend calcium and vitamin D supplementation.   Follow up with [facility] in the future evaluation for BM transplant - no amyloidosis on cardiac MRI

## 2023-11-01 NOTE — Progress Notes (Signed)
 Hematology/Oncology Progress note [phone] [phone]         Patient Care Team: Danella Penton, MD as PCP - General (Internal Medicine) Debbe Odea, MD as PCP - Cardiology (Cardiology) Nobie Putnam, MD as PCP - Electrophysiology (Cardiology) [name], MD as Consulting Physician (Oncology)  CHIEF COMPLAINTS/REASON FOR VISIT:  Multiple myeloma   ASSESSMENT & PLAN:   Cancer Staging  Multiple myeloma [facility]) Staging form: Plasma Cell Myeloma and Plasma Cell Disorders, AJCC 8th Edition - Clinical stage from 04/14/2023: RISS Stage II (Beta-2-microglobulin (mg/L): 8.4, Albumin (g/dL): 2.6, ISS: Stage III, High-risk cytogenetics: Absent, LDH: Normal) - Signed by [name], MD on 04/22/2023   Anemia due to antineoplastic chemotherapy Anemia due to chemotherapy. There maybe a component of anemia of CKD.  Monitor.   CKD (chronic kidney disease) stage 3, GFR 30-59 ml/min (HCC) Encourage oral hydration and avoid nephrotoxins.    Multiple myeloma (HCC) Bone marrow biopsy was reviewed. 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 Cycle 3 D15 Daratumumab was held due to cytopenia and being on anticoagulation.  S/p 4 cycles  Daratumumab and Velcade with revlimid 10mg  2 weeks on 1 week off  Cycle 4 D15 missed due to hospitalization due to ileus. BM biopsy at [facility] after 4 cycles showed 50-60% plasma cells.  Labs are reviewed and discussed with patient.  [facility]. Recommend additional 3-4 cycles.  Proceed with cycle 8 D1 Dara Rvd  start Revlimid 10mg  daily 2 weeks on 1 week off -he started on 10/31/23  Continue Acyclovir, At risk of thrombosis, on Eliquis 5mg  BID for A fib Xegeva monthly if calcium meets criteria Recommend calcium and vitamin D supplementation.   Follow up with [facility] in the future evaluation for  BM transplant - no amyloidosis on cardiac MRI  Atrial fibrillation (HCC) Echo was done on 06/14/23 LVEF 50% Follow up with cardiology, s/p ablation.  Continue Eliquis 5mg  BID   Encounter for antineoplastic chemotherapy Chemotherapy plan as listed above. - hold treatment for now due to recent Ileus. pending decision of BM transplant    Hypocalcemia Recommend patient to take calcium 1200mg  daily. Take vitamin D supplementation.  Check 25 hydroxy vitamin D level.  Recommend IV calcium and he declined   Thrombocytopenia (HCC) Resolved.   Hypoglobulinemia Recommend IVIG 1g/kg x 1  Rationale and side effects were reviewed with patient.  He agrees with the plan.      Orders Placed This Encounter  Procedures   VITAMIN D 25 Hydroxy (Vit-D Deficiency, Fractures)    Standing Status:   Future    Expected Date:   11/08/2023    Expiration Date:   11/07/2024   Follow-up per LOS  We spent sufficient time to discuss many aspect of care, questions were answered to patient's satisfaction.    [name], MD, PhD [facility] Hematology Oncology 11/01/2023     HISTORY OF PRESENTING ILLNESS:  Victor Phillips is a  68 y.o.  male with PMH listed below who was referred to me for anemia  02/11/2023 - 02/14/2023 recent hospitalization due to pneumonia, respiratory failure.  He was found to have a hemoglobin decreased at 6.8, status post PRBC transfusion during admission.  EGD showed gastritis.  Colonoscopy was not remarkable. 02/11/2023 TIBC 221 ferritin 107, iron saturation 16. Patient is currently taking fusion plus Vitamin B12 level  in the 300s. His echo showed grade 2 diastolic CHF.  He denies recent chest pain on exertion, shortness of breath on minimal exertion, pre-syncopal episodes, or palpitations He had not noticed any recent bleeding such as epistaxis, hematuria or hematochezia.  He denies over the counter NSAID ingestion.  Oncology History  Multiple myeloma (HCC)  04/14/2023 Initial  Diagnosis   Multiple myeloma   03/13/2020 multiple myeloma panel showed M protein 4.3, IgA lambda Free lamda Level 142,-light chain ratio 0.07 04/05/2023 bone marrow biopsy showed hypercellular bone marrow with plasma cell neoplasm.The bone marrow is hypercellular for age with prominent increase in plasma cells representing 65% of all cells in the aspirate associated with interstitial infiltrates and numerous variably sized aggregates in  the clot and biopsy sections.  The plasma cells display lambda light chain restriction consistent with plasma cell neoplasm.  Normal cytogenetics.  FISH studies pending.    04/14/2023 Cancer Staging   Staging form: Plasma Cell Myeloma and Plasma Cell Disorders, AJCC 8th Edition - Clinical stage from 04/14/2023: RISS Stage II (Beta-2-microglobulin (mg/L): 8.4, Albumin (g/dL): 2.6, ISS: Stage III, High-risk cytogenetics: Absent, LDH: Normal) - Signed by [name], MD on 04/22/2023 Stage prefix: Initial diagnosis Beta 2 microglobulin range (mg/L): Greater than or equal to 5.5 Albumin range (g/dL): Less than 3.5 Cytogenetics: 1q addition, Other mutation   04/14/2023 Imaging   Skeletal survey 1. No lytic lesions or other intrinsic bony abnormality. 2. Borderline cardiomegaly with an interval decrease in size. 3. Diffuse atheromatous arterial calcifications including bilateral carotid artery calcifications, right greater than left   04/22/2023 -  Chemotherapy   Patient is on Treatment Plan : MYELOMA NEWLY DIAGNOSED TRANSPLANT CANDIDATE DaraVRd (Daratumumab SQ) q21d     04/25/2023 Imaging   PET scan showed  1. Two tiny hypermetabolic lucent lesions in the left posteromedial eighth and ninth ribs. No additional evidence of multiple myeloma. 2. Aortic atherosclerosis (ICD10-I70.0). Coronary artery calcification.   07/14/2023 - 07/19/2023 Hospital Admission   Hospitalized due to ileus.    07/25/2023 Bone Marrow Biopsy   A-C. Peripheral blood and bone marrow, (peripheral  smear, aspirate smear, touch preparation, clot section, core biopsy):   Persistent plasma cell myeloma, 50-60% lambda restricted plasma cells on core biopsy. Hypercellular bone marrow (70%) with trilineage hematopoiesis. Negative for increased blasts. Negative for significant dysplasia. Negative for amyloid deposition.     A Fib. Lowe extremity edema bilaterally, and SOB with exertion.  on Eliquis 5mg  BID, follows up with cardiology. - he has a cardiac procedure coming up next week Denies chest pain, abdominal pain, nausea, vomiting + Fatigue + bilateral lower extremity edema, he takes Lasix 20mg  daily PRN and edema has improved.  Diarrhea has stopped after utilizing imodium PRN as instructed.    MEDICAL HISTORY:  Past Medical History:  Diagnosis Date   Atrial fibrillation (HCC)    Diabetes mellitus without complication (HCC)    Hypercholesteremia    Hypertension    Multiple myeloma not having achieved remission (HCC)     SURGICAL HISTORY: Past Surgical History:  Procedure Laterality Date   CARDIOVERSION N/A 10/17/2023   Procedure: CARDIOVERSION;  Surgeon: Debbe Odea, MD;  Location: [facility] ORS;  Service: Cardiovascular;  Laterality: N/A;   COLONOSCOPY N/A 02/13/2023   Procedure: COLONOSCOPY;  Surgeon: Toledo, Boykin Nearing, MD;  Location: [facility] ENDOSCOPY;  Service: Gastroenterology;  Laterality: N/A;   COLONOSCOPY N/A 02/14/2023   Procedure: COLONOSCOPY;  Surgeon: Toledo, Boykin Nearing, MD;  Location: [facility] ENDOSCOPY;  Service: Gastroenterology;  Laterality: N/A;  ESOPHAGOGASTRODUODENOSCOPY N/A 02/13/2023   Procedure: ESOPHAGOGASTRODUODENOSCOPY (EGD);  Surgeon: Toledo, Boykin Nearing, MD;  Location: [facility] ENDOSCOPY;  Service: Gastroenterology;  Laterality: N/A;    SOCIAL HISTORY: Social History   Socioeconomic History   Marital status: Married    Spouse name: Not on file   Number of children: Not on file   Years of education: Not on file   Highest education level: Not on file   Occupational History   Not on file  Tobacco Use   Smoking status: Never   Smokeless tobacco: Never  Vaping Use   Vaping status: Never Used  Substance and Sexual Activity   Alcohol use: Not Currently    Comment: quit approx 2001   Drug use: No   Sexual activity: Not on file  Other Topics Concern   Not on file  Social History Narrative   Not on file   Social Drivers of Health   Financial Resource Strain: High Risk (10/12/2023)   Received from [facility]   Overall Financial Resource Strain (CARDIA)    Difficulty of Paying Living Expenses: Very hard  Food Insecurity: Food Insecurity Present (10/12/2023)   Received from [facility]   Hunger Vital Sign    Worried About Running Out of Food in the Last Year: Sometimes true    Ran Out of Food in the Last Year: Sometimes true  Transportation Needs: No Transportation Needs (10/12/2023)   Received from [facility] - Transportation    In the past 12 months, has lack of transportation kept you from medical appointments or from getting medications?: No    Lack of Transportation (Non-Medical): No  Physical Activity: Insufficiently Active (10/12/2023)   Received from [facility]   Exercise Vital Sign    Days of Exercise per Week: 7 days    Minutes of Exercise per Session: 10 min  Stress: No Stress Concern Present (10/12/2023)   Received from [facility] - Occupational Stress Questionnaire    Feeling of Stress : Not at all  Recent Concern: Stress - Stress Concern Present (10/04/2023)   Received from [facility] - Occupational Stress Questionnaire    Feeling of Stress : Rather much  Social Connections: Moderately Integrated (10/12/2023)   Received from [facility]   Social Connection and Isolation Panel [NHANES]     Frequency of Communication with Friends and Family: More than three times a week    Frequency of Social Gatherings with Friends and Family: More than three times a week    Attends Religious Services: 1 to 4 times per year    Active Member of [employer] or Organizations: No    Attends Banker Meetings: Never    Marital Status: Married  Catering manager Violence: Not At Risk (07/14/2023)   Humiliation, Afraid, Rape, and Kick questionnaire    Fear of Current or Ex-Partner: No    Emotionally Abused: No    Physically Abused: No    Sexually Abused: No    FAMILY HISTORY: Family History  Problem Relation Age of Onset   Diabetes Mother    Heart attack Father     ALLERGIES:  has no known allergies.  MEDICATIONS:  Current Outpatient Medications  Medication Sig Dispense Refill   acetaminophen (TYLENOL) 325 MG tablet Take 2 tablets (650 mg total) by mouth every 6 (six) hours as  needed for mild pain (or Fever >/= 101). 90 tablet 1   acyclovir (ZOVIRAX) 400 MG tablet Take 1 tablet (400 mg total) by mouth 2 (two) times daily. 60 tablet 5   apixaban (ELIQUIS) 5 MG TABS tablet Take 1 tablet (5 mg total) by mouth 2 (two) times daily. 180 tablet 0   atorvastatin (LIPITOR) 10 MG tablet Take 10 mg by mouth daily.     calcium carbonate (OS-CAL) 600 MG TABS tablet Take 2 tablets (1,200 mg total) by mouth daily.     cholecalciferol (VITAMIN D3) 25 MCG (1000 UNIT) tablet Take 1 tablet (1,000 Units total) by mouth daily.     furosemide (LASIX) 20 MG tablet Take 1 tablet (20 mg total) by mouth daily as needed for edema or fluid. 90 tablet 0   glipiZIDE (GLUCOTROL XL) 2.5 MG 24 hr tablet Take 5 mg by mouth daily with breakfast.     lenalidomide (REVLIMID) 10 MG capsule TAKE 1 CAPSULE BY MOUTH 1 TIME A DAY FOR 14 DAYS ON THEN 7 DAYS OFF, REPEAT EVERY 21 DAYS. 14 capsule 0   loperamide (IMODIUM) 2 MG capsule Take 1 capsule (2 mg total) by mouth See admin instructions. With onset of diarrhea, take 4  mg,then 2 mg every 2 hours or after every loose bowel movements  maximum: 16 mg/day 60 capsule 2   metoprolol succinate (TOPROL-XL) 50 MG 24 hr tablet Take 1 tablet (50 mg total) by mouth 2 (two) times daily. Take with or immediately following a meal. (Patient taking differently: Take 50 mg by mouth daily. Take with or immediately following a meal.) 180 tablet 3   Multiple Vitamin (MULTIVITAMIN WITH MINERALS) TABS tablet Take 1 tablet by mouth daily. 90 tablet 1   ondansetron (ZOFRAN) 8 MG tablet Take 1 tablet (8 mg total) by mouth every 8 (eight) hours as needed for nausea or vomiting. 30 tablet 1   pantoprazole (PROTONIX) 40 MG tablet Take 1 tablet (40 mg total) by mouth daily. 30 tablet 1   prochlorperazine (COMPAZINE) 10 MG tablet Take 1 tablet (10 mg total) by mouth every 6 (six) hours as needed for nausea or vomiting. 30 tablet 1   temazepam (RESTORIL) 15 MG capsule Take 15 mg by mouth at bedtime.     No current facility-administered medications for this visit.    Review of Systems  Constitutional:  Positive for fatigue. Negative for appetite change, chills, fever and unexpected weight change.  HENT:   Negative for hearing loss and voice change.   Eyes:  Negative for eye problems and icterus.  Respiratory:  Negative for chest tightness, cough and shortness of breath.   Cardiovascular:  Positive for leg swelling. Negative for chest pain.  Gastrointestinal:  Negative for abdominal distention, abdominal pain and diarrhea.  Endocrine: Negative for hot flashes.  Genitourinary:  Negative for difficulty urinating, dysuria and frequency.   Musculoskeletal:  Negative for arthralgias.  Skin:  Negative for itching and rash.  Neurological:  Negative for light-headedness and numbness.  Hematological:  Negative for adenopathy. Does not bruise/bleed easily.  Psychiatric/Behavioral:  Positive for sleep disturbance. Negative for confusion.     PHYSICAL EXAMINATION: Vitals:   11/01/23 0826  BP: (!)  143/73  Pulse: 94  Resp: 18  Temp: (!) 96.6 F (35.9 C)  SpO2: 100%   Filed Weights   11/01/23 0826  Weight: 178 lb 1.6 oz (80.8 kg)    Physical Exam Constitutional:      General: He is not in acute  distress.    Appearance: He is obese.  HENT:     Head: Normocephalic and atraumatic.  Eyes:     General: No scleral icterus. Cardiovascular:     Rate and Rhythm: Normal rate and regular rhythm.  Pulmonary:     Effort: Pulmonary effort is normal. No respiratory distress.     Comments: Crackles at the base of lung  Abdominal:     General: Bowel sounds are normal. There is no distension.     Palpations: Abdomen is soft.  Musculoskeletal:        General: No deformity. Normal range of motion.     Cervical back: Normal range of motion and neck supple.     Right lower leg: Edema present.     Left lower leg: Edema present.  Skin:    General: Skin is warm and dry.     Findings: No erythema or rash.  Neurological:     Mental Status: He is alert and oriented to person, place, and time. Mental status is at baseline.     Cranial Nerves: No cranial nerve deficit.  Psychiatric:        Mood and Affect: Mood normal.      LABORATORY DATA:  I have reviewed the data as listed    Latest Ref Rng & Units 11/01/2023    7:53 AM 10/25/2023   11:53 AM 10/18/2023   10:16 AM  CBC  WBC 4.0 - 10.5 K/uL 3.8  7.8  9.1   Hemoglobin 13.0 - 17.0 g/dL 9.2  9.1  16.1   Hematocrit 39.0 - 52.0 % 28.8  27.8  30.1   Platelets 150 - 400 K/uL 158  56  94       Latest Ref Rng & Units 11/01/2023    7:53 AM 10/18/2023   10:15 AM 10/10/2023    8:01 AM  CMP  Glucose 70 - 99 mg/dL 096  045  409   BUN 8 - 23 mg/dL 28  26  24    Creatinine 0.61 - 1.24 mg/dL 8.11  9.14  7.82   Sodium 135 - 145 mmol/L 136  137  136   Potassium 3.5 - 5.1 mmol/L 4.1  3.8  4.4   Chloride 98 - 111 mmol/L 105  104  101   CO2 22 - 32 mmol/L 22  24  26    Calcium 8.9 - 10.3 mg/dL 8.4  8.3  9.6   Total Protein 6.5 - 8.1 g/dL 7.5  7.1   7.8   Total Bilirubin 0.0 - 1.2 mg/dL 0.8  0.7  0.6   Alkaline Phos 38 - 126 U/L 101  101  92   AST 15 - 41 U/L 23  26  18    ALT 0 - 44 U/L 39  38  23    Lab Results  Component Value Date   IRON 100 03/14/2023   TIBC 272 03/14/2023   IRONPCTSAT 37 03/14/2023   FERRITIN 249 03/14/2023     RADIOGRAPHIC STUDIES: I have personally reviewed the radiological images as listed and agreed with the findings in the report. No results found.

## 2023-11-02 ENCOUNTER — Other Ambulatory Visit: Payer: Self-pay | Admitting: Oncology

## 2023-11-02 LAB — KAPPA/LAMBDA LIGHT CHAINS
Kappa free light chain: 7 mg/L (ref 3.3–19.4)
Kappa, lambda light chain ratio: 0.08 — ABNORMAL LOW (ref 0.26–1.65)
Lambda free light chains: 87.8 mg/L — ABNORMAL HIGH (ref 5.7–26.3)

## 2023-11-03 ENCOUNTER — Other Ambulatory Visit: Payer: Self-pay | Admitting: Oncology

## 2023-11-03 LAB — MULTIPLE MYELOMA PANEL, SERUM
Albumin SerPl Elph-Mcnc: 3.5 g/dL (ref 2.9–4.4)
Albumin/Glob SerPl: 1.1 (ref 0.7–1.7)
Alpha 1: 0.3 g/dL (ref 0.0–0.4)
Alpha2 Glob SerPl Elph-Mcnc: 0.9 g/dL (ref 0.4–1.0)
B-Globulin SerPl Elph-Mcnc: 0.9 g/dL (ref 0.7–1.3)
Gamma Glob SerPl Elph-Mcnc: 1.3 g/dL (ref 0.4–1.8)
Globulin, Total: 3.3 g/dL (ref 2.2–3.9)
IgA: 1746 mg/dL — ABNORMAL HIGH (ref 61–437)
IgG (Immunoglobin G), Serum: 248 mg/dL — ABNORMAL LOW (ref 603–1613)
IgM (Immunoglobulin M), Srm: 7 mg/dL — ABNORMAL LOW (ref 20–172)
M Protein SerPl Elph-Mcnc: 1 g/dL — ABNORMAL HIGH
Total Protein ELP: 6.8 g/dL (ref 6.0–8.5)

## 2023-11-04 ENCOUNTER — Inpatient Hospital Stay: Payer: Medicare HMO

## 2023-11-04 VITALS — BP 117/76 | HR 84 | Temp 98.2°F | Resp 18

## 2023-11-04 DIAGNOSIS — C9000 Multiple myeloma not having achieved remission: Secondary | ICD-10-CM

## 2023-11-04 DIAGNOSIS — Z5112 Encounter for antineoplastic immunotherapy: Secondary | ICD-10-CM | POA: Diagnosis not present

## 2023-11-04 MED ORDER — PROCHLORPERAZINE MALEATE 10 MG PO TABS
10.0000 mg | ORAL_TABLET | Freq: Once | ORAL | Status: AC
  Administered 2023-11-04: 10 mg via ORAL
  Filled 2023-11-04: qty 1

## 2023-11-04 MED ORDER — BORTEZOMIB CHEMO SQ INJECTION 3.5 MG (2.5MG/ML)
1.3000 mg/m2 | Freq: Once | INTRAMUSCULAR | Status: AC
  Administered 2023-11-04: 2.5 mg via SUBCUTANEOUS
  Filled 2023-11-04: qty 1

## 2023-11-04 NOTE — Patient Instructions (Signed)
 [facility] - A DEPT OF [facility]  Discharge Instructions: Thank you for choosing [facility] to provide your oncology and hematology care.  If you have a lab appointment with the Cancer Center, please go directly to the Cancer Center and check in at the registration area.  Wear comfortable clothing and clothing appropriate for easy access to any Portacath or PICC line.   We strive to give you quality time with your provider. You may need to reschedule your appointment if you arrive late (15 or more minutes).  Arriving late affects you and other patients whose appointments are after yours.  Also, if you miss three or more appointments without notifying the office, you may be dismissed from the clinic at the provider's discretion.      For prescription refill requests, have your pharmacy contact our office and allow 72 hours for refills to be completed.    Today you received the following chemotherapy and/or immunotherapy agents Velcade      To help prevent nausea and vomiting after your treatment, we encourage you to take your nausea medication as directed.  BELOW ARE SYMPTOMS THAT SHOULD BE REPORTED IMMEDIATELY: *FEVER GREATER THAN 100.4 F (38 C) OR HIGHER *CHILLS OR SWEATING *NAUSEA AND VOMITING THAT IS NOT CONTROLLED WITH YOUR NAUSEA MEDICATION *UNUSUAL SHORTNESS OF BREATH *UNUSUAL BRUISING OR BLEEDING *URINARY PROBLEMS (pain or burning when urinating, or frequent urination) *BOWEL PROBLEMS (unusual diarrhea, constipation, pain near the anus) TENDERNESS IN MOUTH AND THROAT WITH OR WITHOUT PRESENCE OF ULCERS (sore throat, sores in mouth, or a toothache) UNUSUAL RASH, SWELLING OR PAIN  UNUSUAL VAGINAL DISCHARGE OR ITCHING   Items with * indicate a potential emergency and should be followed up as soon as possible or go to the Emergency Department if any problems should occur.  Please show the CHEMOTHERAPY ALERT CARD or IMMUNOTHERAPY  ALERT CARD at check-in to the Emergency Department and triage nurse.  Should you have questions after your visit or need to cancel or reschedule your appointment, please contact [facility] - A DEPT OF [facility]  [phone] and follow the prompts.  Office hours are 8:00 a.m. to 4:30 p.m. Monday - Friday. Please note that voicemails left after 4:00 p.m. may not be returned until the following business day.  We are closed weekends and major holidays. You have access to a nurse at all times for urgent questions. Please call the main number to the clinic [phone] and follow the prompts.  For any non-urgent questions, you may also contact your provider using MyChart. We now offer e-Visits for anyone 19 and older to request care online for non-urgent symptoms. For details visit [email or web address].   Also download the MyChart app! Go to the app store, search "MyChart", open the app, select [place], and log in with your MyChart username and password.

## 2023-11-07 ENCOUNTER — Inpatient Hospital Stay: Payer: Medicare HMO

## 2023-11-07 VITALS — BP 108/75 | HR 65 | Temp 96.9°F | Resp 18 | Wt 164.9 lb

## 2023-11-07 DIAGNOSIS — C9000 Multiple myeloma not having achieved remission: Secondary | ICD-10-CM

## 2023-11-07 DIAGNOSIS — Z5112 Encounter for antineoplastic immunotherapy: Secondary | ICD-10-CM | POA: Diagnosis not present

## 2023-11-07 MED ORDER — DIPHENHYDRAMINE HCL 25 MG PO CAPS
25.0000 mg | ORAL_CAPSULE | Freq: Once | ORAL | Status: AC
  Administered 2023-11-07: 25 mg via ORAL
  Filled 2023-11-07: qty 1

## 2023-11-07 MED ORDER — ACETAMINOPHEN 325 MG PO TABS
650.0000 mg | ORAL_TABLET | Freq: Once | ORAL | Status: AC
  Administered 2023-11-07: 650 mg via ORAL
  Filled 2023-11-07: qty 2

## 2023-11-07 MED ORDER — IMMUNE GLOBULIN (HUMAN) 20 GM/200ML IV SOLN
80.0000 g | INTRAVENOUS | Status: DC | PRN
Start: 2023-11-07 — End: 2023-11-07
  Administered 2023-11-07: 80 g via INTRAVENOUS
  Filled 2023-11-07: qty 800

## 2023-11-07 MED ORDER — DEXTROSE 5 % IV SOLN
INTRAVENOUS | Status: DC
  Filled 2023-11-07: qty 250

## 2023-11-07 NOTE — Progress Notes (Unsigned)
 Electrophysiology Office Note:   Date:  11/09/2023  ID:  LEHMAN WHITELEY, DOB 11/18/1955, MRN [id number]  Primary Cardiologist: Debbe Odea, MD Electrophysiologist: Nobie Putnam, MD      History of Present Illness:   Victor Phillips is a 68 y.o. male with h/o persistent atrial fibrillation, hypertension, hyperlipidemia, diabetes, multiple myeloma who is being seen today for EP evaluation at the request of Dr. Azucena Cecil.    Discussed the use of AI scribe software for clinical note transcription with the patient, who gave verbal consent to proceed.  History of Present Illness   The patient, with a history of atrial fibrillation and multiple myeloma, was referred to EP for management of atrial fibrillation. The atrial fibrillation was first diagnosed in the emergency room in May of the previous year after presenting to the ED after a fall, according to the patient. He reports feeling better immediately after a cardioversion on February 3rd, mainly due to a decrease in palpitations. However, the patient has since reverted back to atrial fibrillation and is not sure when this occurred. The patient also reports ongoing shortness of breath with exertion. The patient is currently on metoprolol for rate control. The patient also has multiple myeloma and is currently undergoing chemotherapy. There are plans for possible bone marrow transplant. The patient is on Eliquis for stroke prevention but is concerned about the cost of the medication.     Review of systems complete and found to be negative unless listed in HPI.   EP Information / Studies Reviewed:    EKG is ordered today. Personal review as below.  EKG Interpretation Date/Time:  Tuesday November 08 2023 10:16:00 EST Ventricular Rate:  85 PR Interval:    QRS Duration:  92 QT Interval:  400 QTC Calculation: 476 R Axis:   87  Text Interpretation: Atrial fibrillation Incomplete right bundle branch block Nonspecific ST abnormality  When compared with ECG of 17-Oct-2023 12:10,now back in atrial fibrillation. Confirmed by Nobie Putnam [id number]) on 11/08/2023 10:26:45 AM   EKG 10/06/23: AF    Cardiac MRI 07/27/23: IMPRESSION: 1.  Normal LV size, low normal systolic function.  LVEF 50% 2.  No LV LGE or scar. 3.  Normal ECV, no evidence for infiltrative disease. 4.  No significant valvular abnormalities. 5.  Normal RV systolic function. 6.  No evidence for cardiac amyloidosis.  Echo 06/14/23:  1. Left ventricular ejection fraction, by estimation, is 50 to 55%. The  left ventricle has low normal function. The left ventricle has no regional  wall motion abnormalities. There is mild left ventricular hypertrophy.  Left ventricular diastolic function   could not be evaluated.   2. Right ventricular systolic function is normal. The right ventricular  size is normal. There is moderately elevated pulmonary artery systolic  pressure.   3. Right atrial size was mildly dilated.   4. The mitral valve is normal in structure. Mild mitral valve  regurgitation.   5. The aortic valve is tricuspid. Aortic valve regurgitation is not  visualized. No aortic stenosis is present.   6. The inferior vena cava is normal in size with <50% respiratory  variability, suggesting right atrial pressure of 8 mmHg.   Risk Assessment/Calculations:    CHA2DS2-VASc Score = 3   This indicates a 3.2% annual risk of stroke. The patient's score is based upon: CHF History: 0 HTN History: 1 Diabetes History: 1 Stroke History: 0 Vascular Disease History: 0 Age Score: 1 Gender Score: 0  Physical Exam:   VS:  BP 112/68 (BP Location: Left Arm, Patient Position: Sitting, Cuff Size: Normal)   Pulse 82   Ht 5\' 7"  (1.702 m)   Wt 162 lb (73.5 kg)   SpO2 97%   BMI 25.37 kg/m    Wt Readings from Last 3 Encounters:  11/08/23 174 lb 4.8 oz (79.1 kg)  11/08/23 162 lb (73.5 kg)  11/07/23 164 lb 14.5 oz (74.8 kg)    GEN: Well nourished,  well developed in no acute distress NECK: No JVD CARDIAC: Normal rate, irregular rhythm. RESPIRATORY:  Clear to auscultation without rales, wheezing or rhonchi  ABDOMEN: Soft, non-distended EXTREMITIES:  2+ edema; No deformity   ASSESSMENT AND PLAN:   Victor Phillips is a 68 y.o. male with h/o persistent atrial fibrillation, hypertension, hyperlipidemia, diabetes, multiple myeloma who is being seen today for EP evaluation at the request of Dr. Azucena Cecil.    #Persistent atrial fibrillation: Unclear how symptomatic he is. He went back into AF after cardioversion but is not sure when and hadn't notice a change with this. #Secondary hypercoagulable state due to atrial fibrillation: CHADSVASC score of 3. -He is worried about cost of apixaban. He only has 1 week supply left. I have referred him for a visit with our cardiac pharmacists to see if there are any DOAC alternatives or programs to help with cost. If not, then I recommend he be transitioned to warfarin.  -Continue metoprolol for now. We have room to increase if needed. He is adequately rate controlled.  -We will defer rhythm control options until he can be reliably anti-coagulated. He is not a candidate for catheter ablation at this time due to his co-morbidities. Tikosyn would be an acceptable rhythm control strategy once anti-coagulation regimen is finalized.  #Hypertension -At goal today.  Recommend checking blood pressures 1-2 times per week at home and recording the values.  Recommend bringing these recordings to the primary care physician.  #Peripheral Edema: Significant leg swelling, potentially exacerbated by diltiazem.  - Stop diltiazem. - Continue lasix. - Monitor for improvement in leg swelling.  #Multiple myeloma: Currently undergoing chemotherapy. Being evaluated for bone marrow transplant. -Continue follow up with Oncology.  Follow up with EP APP in 4 weeks to ensure anti-coagulation regimen has been finalized and  consideration for starting anti-arrhythmic drug therapy.  Signed, Nobie Putnam, MD

## 2023-11-07 NOTE — Patient Instructions (Signed)
 [facility]  Discharge Instructions: Thank you for choosing [facility] to provide your oncology and hematology care.  If you have a lab appointment with the Cancer Center, please go directly to the Cancer Center and check in at the registration area.  Wear comfortable clothing and clothing appropriate for easy access to any Portacath or PICC line.   We strive to give you quality time with your provider. You may need to reschedule your appointment if you arrive late (15 or more minutes).  Arriving late affects you and other patients whose appointments are after yours.  Also, if you miss three or more appointments without notifying the office, you may be dismissed from the clinic at the provider's discretion.      For prescription refill requests, have your pharmacy contact our office and allow 72 hours for refills to be completed.    Today you received the following chemotherapy and/or immunotherapy agents IVIG      To help prevent nausea and vomiting after your treatment, we encourage you to take your nausea medication as directed.  BELOW ARE SYMPTOMS THAT SHOULD BE REPORTED IMMEDIATELY: *FEVER GREATER THAN 100.4 F (38 C) OR HIGHER *CHILLS OR SWEATING *NAUSEA AND VOMITING THAT IS NOT CONTROLLED WITH YOUR NAUSEA MEDICATION *UNUSUAL SHORTNESS OF BREATH *UNUSUAL BRUISING OR BLEEDING *URINARY PROBLEMS (pain or burning when urinating, or frequent urination) *BOWEL PROBLEMS (unusual diarrhea, constipation, pain near the anus) TENDERNESS IN MOUTH AND THROAT WITH OR WITHOUT PRESENCE OF ULCERS (sore throat, sores in mouth, or a toothache) UNUSUAL RASH, SWELLING OR PAIN  UNUSUAL VAGINAL DISCHARGE OR ITCHING   Items with * indicate a potential emergency and should be followed up as soon as possible or go to the Emergency Department if any problems should occur.  Please show the CHEMOTHERAPY ALERT CARD or IMMUNOTHERAPY ALERT  CARD at check-in to the Emergency Department and triage nurse.  Should you have questions after your visit or need to cancel or reschedule your appointment, please contact [facility] - A DEPT OF [facility]  [phone] and follow the prompts.  Office hours are 8:00 a.m. to 4:30 p.m. Monday - Friday. Please note that voicemails left after 4:00 p.m. may not be returned until the following business day.  We are closed weekends and major holidays. You have access to a nurse at all times for urgent questions. Please call the main number to the clinic [phone] and follow the prompts.  For any non-urgent questions, you may also contact your provider using MyChart. We now offer e-Visits for anyone 44 and older to request care online for non-urgent symptoms. For details visit [email or web address].   Also download the MyChart app! Go to the app store, search "MyChart", open the app, select , and log in with your MyChart username and password.  Immune Globulin Injection What is this medication? IMMUNE GLOBULIN (im MUNE GLOB yoo lin) treats many immune system conditions. It works by Designer, multimedia extra antibodies. Antibodies are proteins made by the immune system that help protect the body. This medicine may be used for other purposes; ask your health care provider or pharmacist if you have questions. COMMON BRAND NAME(S): ASCENIV, Baygam, BIVIGAM, Carimune, Carimune NF, cutaquig, Cuvitru, Flebogamma, Flebogamma DIF, GamaSTAN, GamaSTAN S/D, Gamimune N, Gammagard, Gammagard S/D, Gammaked, Gammaplex, Gammar-P IV, Gamunex, Gamunex-C, Hizentra, Iveegam, Iveegam EN, Octagam, Panglobulin, Panglobulin NF, panzyga, Polygam S/D,  Privigen, Sandoglobulin, Venoglobulin-S, Vigam, Vivaglobulin, Xembify What should I tell my care team before I take this medication? They need to know if you have any of these conditions: Blood clotting disorder Condition where you have  excess fluid in your body, such as heart failure or edema Dehydration Diabetes Have had blood clots Heart disease Immune system conditions Kidney disease Low levels of IgA Recent or upcoming vaccine An unusual or allergic reaction to immune globulin, other medications, foods, dyes, or preservatives Pregnant or trying to get pregnant Breastfeeding How should I use this medication? This medication is infused into a vein or under the skin. It is usually given by your care team in a hospital or clinic setting. It may also be given at home. If you get this medication at home, you will be taught how to prepare and give it. Use exactly as directed. Take it as directed on the prescription label at the same time every day. Keep taking it unless your care team tells you to stop. It is important that you put your used needles and syringes in a special sharps container. Do not put them in a trash can. If you do not have a sharps container, call your pharmacist or care team to get one. Talk to your care team about the use of this medication in children. While it may be given to children for selected conditions, precautions do apply. Overdosage: If you think you have taken too much of this medicine contact a poison control center or emergency room at once. NOTE: This medicine is only for you. Do not share this medicine with others. What if I miss a dose? If you get this medication at the hospital or clinic: It is important not to miss your dose. Call your care team if you are unable to keep an appointment. If you give yourself this medication at home: If you miss a dose, take it as soon as you can. Then continue your normal schedule. If it is almost time for your next dose, take only that dose. Do not take double or extra doses. Call your care team with questions. What may interact with this medication? Live virus vaccines This list may not describe all possible interactions. Give your health care provider  a list of all the medicines, herbs, non-prescription drugs, or dietary supplements you use. Also tell them if you smoke, drink alcohol, or use illegal drugs. Some items may interact with your medicine. What should I watch for while using this medication? Your condition will be monitored carefully while you are receiving this medication. Tell your care team if your symptoms do not start to get better or if they get worse. You may need blood work done while you are taking this medication. This medication increases the risk of blood clots. People with heart, blood vessel, or blood clotting conditions are more likely to develop a blood clot. Other risk factors include advanced age, estrogen use, tobacco use, lack of movement, and being overweight. This medication can decrease the response to a vaccine. If you need to get vaccinated, tell your care team if you have received this medication within the last year. Extra booster doses may be needed. Talk to your care team to see if a different vaccination schedule is needed. If you have diabetes, you may get a falsely elevated blood sugar reading. Talk to your care team about how to check your blood sugar while taking this medication. What side effects may I notice from receiving this medication?  Side effects that you should report to your care team as soon as possible: Allergic reactions--skin rash, itching, hives, swelling of the face, lips, tongue, or throat Blood clot--pain, swelling, or warmth in the leg, shortness of breath, chest pain Fever, neck pain or stiffness, sensitivity to light, headache, nausea, vomiting, confusion, which may be signs of meningitis Hemolytic anemia--unusual weakness or fatigue, dizziness, headache, trouble breathing, dark urine, yellowing skin or eyes Kidney injury--decrease in the amount of urine, swelling of the ankles, hands, or feet Low sodium level--muscle weakness, fatigue, dizziness, headache, confusion Shortness of  breath or trouble breathing, cough, unusual weakness or fatigue, blue skin or lips Side effects that usually do not require medical attention (report these to your care team if they continue or are bothersome): Chills Diarrhea Fever Headache Nausea This list may not describe all possible side effects. Call your doctor for medical advice about side effects. You may report side effects to FDA at 1-800-FDA-1088. Where should I keep my medication? Keep out of the reach of children and pets. You will be instructed on how to store this medication. Get rid of any unused medication after the expiration date. To get rid of medications that are no longer needed or have expired: Take the medication to a medication take-back program. Check with your pharmacy or law enforcement to find a location. If you cannot return the medication, ask your pharmacist or care team how to get rid of this medication safely. NOTE: This sheet is a summary. It may not cover all possible information. If you have questions about this medicine, talk to your doctor, pharmacist, or health care provider.  2024 Elsevier/Gold Standard (2023-08-12 00:00:00)

## 2023-11-08 ENCOUNTER — Inpatient Hospital Stay (HOSPITAL_BASED_OUTPATIENT_CLINIC_OR_DEPARTMENT_OTHER): Payer: Medicare HMO | Admitting: Oncology

## 2023-11-08 ENCOUNTER — Encounter: Payer: Self-pay | Admitting: Cardiology

## 2023-11-08 ENCOUNTER — Ambulatory Visit: Payer: Medicare HMO

## 2023-11-08 ENCOUNTER — Ambulatory Visit: Payer: Medicare HMO | Attending: Cardiology | Admitting: Cardiology

## 2023-11-08 ENCOUNTER — Inpatient Hospital Stay: Payer: Medicare HMO

## 2023-11-08 ENCOUNTER — Encounter: Payer: Self-pay | Admitting: Oncology

## 2023-11-08 VITALS — BP 118/57 | HR 72 | Resp 16

## 2023-11-08 VITALS — BP 112/68 | HR 82 | Ht 67.0 in | Wt 162.0 lb

## 2023-11-08 VITALS — BP 101/69 | HR 85 | Temp 96.5°F | Resp 18 | Wt 174.3 lb

## 2023-11-08 DIAGNOSIS — C9000 Multiple myeloma not having achieved remission: Secondary | ICD-10-CM | POA: Diagnosis not present

## 2023-11-08 DIAGNOSIS — Z5111 Encounter for antineoplastic chemotherapy: Secondary | ICD-10-CM

## 2023-11-08 DIAGNOSIS — I10 Essential (primary) hypertension: Secondary | ICD-10-CM

## 2023-11-08 DIAGNOSIS — R600 Localized edema: Secondary | ICD-10-CM

## 2023-11-08 DIAGNOSIS — N1832 Chronic kidney disease, stage 3b: Secondary | ICD-10-CM

## 2023-11-08 DIAGNOSIS — D6869 Other thrombophilia: Secondary | ICD-10-CM

## 2023-11-08 DIAGNOSIS — Z5112 Encounter for antineoplastic immunotherapy: Secondary | ICD-10-CM | POA: Diagnosis not present

## 2023-11-08 DIAGNOSIS — D6481 Anemia due to antineoplastic chemotherapy: Secondary | ICD-10-CM | POA: Diagnosis not present

## 2023-11-08 DIAGNOSIS — I4819 Other persistent atrial fibrillation: Secondary | ICD-10-CM | POA: Diagnosis not present

## 2023-11-08 DIAGNOSIS — I4891 Unspecified atrial fibrillation: Secondary | ICD-10-CM

## 2023-11-08 DIAGNOSIS — T451X5A Adverse effect of antineoplastic and immunosuppressive drugs, initial encounter: Secondary | ICD-10-CM

## 2023-11-08 LAB — CMP (CANCER CENTER ONLY)
ALT: 36 U/L (ref 0–44)
AST: 28 U/L (ref 15–41)
Albumin: 3.4 g/dL — ABNORMAL LOW (ref 3.5–5.0)
Alkaline Phosphatase: 80 U/L (ref 38–126)
Anion gap: 7 (ref 5–15)
BUN: 26 mg/dL — ABNORMAL HIGH (ref 8–23)
CO2: 23 mmol/L (ref 22–32)
Calcium: 8.7 mg/dL — ABNORMAL LOW (ref 8.9–10.3)
Chloride: 102 mmol/L (ref 98–111)
Creatinine: 0.99 mg/dL (ref 0.61–1.24)
GFR, Estimated: >60 mL/min (ref >60)
Glucose, Bld: 112 mg/dL — ABNORMAL HIGH (ref 70–99)
Potassium: 4.2 mmol/L (ref 3.5–5.1)
Sodium: 132 mmol/L — ABNORMAL LOW (ref 135–145)
Total Bilirubin: 0.6 mg/dL (ref 0.0–1.2)
Total Protein: 8.1 g/dL (ref 6.5–8.1)

## 2023-11-08 LAB — CBC WITH DIFFERENTIAL (CANCER CENTER ONLY)
Abs Immature Granulocytes: 0.16 10*3/uL — ABNORMAL HIGH (ref 0.00–0.07)
Basophils Absolute: 0 10*3/uL (ref 0.0–0.1)
Basophils Relative: 1 %
Eosinophils Absolute: 0.1 10*3/uL (ref 0.0–0.5)
Eosinophils Relative: 2 %
HCT: 25.9 % — ABNORMAL LOW (ref 39.0–52.0)
Hemoglobin: 8.5 g/dL — ABNORMAL LOW (ref 13.0–17.0)
Immature Granulocytes: 4 %
Lymphocytes Relative: 11 %
Lymphs Abs: 0.5 10*3/uL — ABNORMAL LOW (ref 0.7–4.0)
MCH: 32.9 pg (ref 26.0–34.0)
MCHC: 32.8 g/dL (ref 30.0–36.0)
MCV: 100.4 fL — ABNORMAL HIGH (ref 80.0–100.0)
Monocytes Absolute: 0.3 10*3/uL (ref 0.1–1.0)
Monocytes Relative: 6 %
Neutro Abs: 3.3 10*3/uL (ref 1.7–7.7)
Neutrophils Relative %: 76 %
Platelet Count: 124 10*3/uL — ABNORMAL LOW (ref 150–400)
RBC: 2.58 MIL/uL — ABNORMAL LOW (ref 4.22–5.81)
RDW: 15.1 % (ref 11.5–15.5)
WBC Count: 4.3 10*3/uL (ref 4.0–10.5)
nRBC: 0.5 % — ABNORMAL HIGH (ref 0.0–0.2)

## 2023-11-08 LAB — VITAMIN D 25 HYDROXY (VIT D DEFICIENCY, FRACTURES): Vit D, 25-Hydroxy: 27.41 ng/mL — ABNORMAL LOW (ref 30–100)

## 2023-11-08 MED ORDER — DEXAMETHASONE 4 MG PO TABS
20.0000 mg | ORAL_TABLET | Freq: Once | ORAL | Status: AC
  Administered 2023-11-08: 20 mg via ORAL
  Filled 2023-11-08: qty 5

## 2023-11-08 MED ORDER — BORTEZOMIB CHEMO SQ INJECTION 3.5 MG (2.5MG/ML)
1.3000 mg/m2 | Freq: Once | INTRAMUSCULAR | Status: AC
  Administered 2023-11-08: 2.5 mg via SUBCUTANEOUS
  Filled 2023-11-08: qty 1

## 2023-11-08 MED ORDER — DENOSUMAB 120 MG/1.7ML ~~LOC~~ SOLN
120.0000 mg | Freq: Once | SUBCUTANEOUS | Status: AC
  Administered 2023-11-08: 120 mg via SUBCUTANEOUS
  Filled 2023-11-08: qty 1.7

## 2023-11-08 MED ORDER — ACETAMINOPHEN 325 MG PO TABS
650.0000 mg | ORAL_TABLET | Freq: Once | ORAL | Status: AC
  Administered 2023-11-08: 650 mg via ORAL
  Filled 2023-11-08: qty 2

## 2023-11-08 MED ORDER — DARATUMUMAB-HYALURONIDASE-FIHJ 1800-30000 MG-UT/15ML ~~LOC~~ SOLN
1800.0000 mg | Freq: Once | SUBCUTANEOUS | Status: AC
  Administered 2023-11-08: 1800 mg via SUBCUTANEOUS
  Filled 2023-11-08: qty 15

## 2023-11-08 MED ORDER — DIPHENHYDRAMINE HCL 25 MG PO CAPS
50.0000 mg | ORAL_CAPSULE | Freq: Once | ORAL | Status: AC
  Administered 2023-11-08: 50 mg via ORAL
  Filled 2023-11-08: qty 2

## 2023-11-08 NOTE — Assessment & Plan Note (Signed)
 Recommend patient to take calcium 1200mg  daily. Take vitamin D supplementation.  Check 25 hydroxy vitamin D level.

## 2023-11-08 NOTE — Patient Instructions (Addendum)
 Medication Instructions:  Your physician recommends that you continue on your current medications as directed. Please refer to the Current Medication list given to you today.  *If you need a refill on your cardiac medications before your next appointment, please call your pharmacy*  Follow-Up: At [facility], you and your health needs are our priority.  As part of our continuing mission to provide you with exceptional heart care, we have created designated Provider Care Teams.  These Care Teams include your primary Cardiologist (physician) and Advanced Practice Providers (APPs -  Physician Assistants and Nurse Practitioners) who all work together to provide you with the care you need, when you need it.  Your next appointment:   4-6 weeks  Provider:   Sherie Don, NP    Other Instructions We will have you see our PharmD team to discuss anticoagulation medications.

## 2023-11-08 NOTE — Assessment & Plan Note (Signed)
 Anemia due to chemotherapy. There maybe a component of anemia of CKD.  Monitor.

## 2023-11-08 NOTE — Assessment & Plan Note (Signed)
 Encourage oral hydration and avoid nephrotoxins.

## 2023-11-08 NOTE — Assessment & Plan Note (Signed)
 Echo was done on 06/14/23 LVEF 50% Follow up with cardiology, s/p ablation.  Continue Eliquis 5mg  BID

## 2023-11-08 NOTE — Assessment & Plan Note (Signed)
 Chemotherapy plan as listed above. - hold treatment for now due to recent Ileus. pending decision of BM transplant

## 2023-11-08 NOTE — Patient Instructions (Signed)
 [facility]  Discharge Instructions: Thank you for choosing [facility] to provide your oncology and hematology care.  If you have a lab appointment with the Cancer Center, please go directly to the Cancer Center and check in at the registration area.  Wear comfortable clothing and clothing appropriate for easy access to any Portacath or PICC line.   We strive to give you quality time with your provider. You may need to reschedule your appointment if you arrive late (15 or more minutes).  Arriving late affects you and other patients whose appointments are after yours.  Also, if you miss three or more appointments without notifying the office, you may be dismissed from the clinic at the provider's discretion.      For prescription refill requests, have your pharmacy contact our office and allow 72 hours for refills to be completed.    Today you received the following chemotherapy and/or immunotherapy agents Velcade and daratumumab.       To help prevent nausea and vomiting after your treatment, we encourage you to take your nausea medication as directed.  BELOW ARE SYMPTOMS THAT SHOULD BE REPORTED IMMEDIATELY: *FEVER GREATER THAN 100.4 F (38 C) OR HIGHER *CHILLS OR SWEATING *NAUSEA AND VOMITING THAT IS NOT CONTROLLED WITH YOUR NAUSEA MEDICATION *UNUSUAL SHORTNESS OF BREATH *UNUSUAL BRUISING OR BLEEDING *URINARY PROBLEMS (pain or burning when urinating, or frequent urination) *BOWEL PROBLEMS (unusual diarrhea, constipation, pain near the anus) TENDERNESS IN MOUTH AND THROAT WITH OR WITHOUT PRESENCE OF ULCERS (sore throat, sores in mouth, or a toothache) UNUSUAL RASH, SWELLING OR PAIN  UNUSUAL VAGINAL DISCHARGE OR ITCHING   Items with * indicate a potential emergency and should be followed up as soon as possible or go to the Emergency Department if any problems should occur.  Please show the CHEMOTHERAPY ALERT CARD or  IMMUNOTHERAPY ALERT CARD at check-in to the Emergency Department and triage nurse.  Should you have questions after your visit or need to cancel or reschedule your appointment, please contact [facility] - A DEPT OF [facility]  [phone] and follow the prompts.  Office hours are 8:00 a.m. to 4:30 p.m. Monday - Friday. Please note that voicemails left after 4:00 p.m. may not be returned until the following business day.  We are closed weekends and major holidays. You have access to a nurse at all times for urgent questions. Please call the main number to the clinic [phone] and follow the prompts.  For any non-urgent questions, you may also contact your provider using MyChart. We now offer e-Visits for anyone 76 and older to request care online for non-urgent symptoms. For details visit [email or web address].   Also download the MyChart app! Go to the app store, search "MyChart", open the app, select [place], and log in with your MyChart username and password.

## 2023-11-08 NOTE — Progress Notes (Signed)
 Hematology/Oncology Progress note [phone] [phone]         Patient Care Team: Danella Penton, MD as PCP - General (Internal Medicine) Debbe Odea, MD as PCP - Cardiology (Cardiology) Nobie Putnam, MD as PCP - Electrophysiology (Cardiology) [name], MD as Consulting Physician (Oncology)  CHIEF COMPLAINTS/REASON FOR VISIT:  Multiple myeloma   ASSESSMENT & PLAN:   Cancer Staging  Multiple myeloma [facility]) Staging form: Plasma Cell Myeloma and Plasma Cell Disorders, AJCC 8th Edition - Clinical stage from 04/14/2023: RISS Stage II (Beta-2-microglobulin (mg/L): 8.4, Albumin (g/dL): 2.6, ISS: Stage III, High-risk cytogenetics: Absent, LDH: Normal) - Signed by [name], MD on 04/22/2023   Multiple myeloma (HCC) Bone marrow biopsy was reviewed. 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 Cycle 3 D15 Daratumumab was held due to cytopenia and being on anticoagulation.  S/p 4 cycles  Daratumumab and Velcade with revlimid 10mg  2 weeks on 1 week off  Cycle 4 D15 missed due to hospitalization due to ileus. BM biopsy at [facility] after 4 cycles showed 50-60% plasma cells.  Labs are reviewed and discussed with patient.  [facility]. Recommend additional 3-4 cycles.  Proceed with cycle 8 D8 Dara Rvd  start Revlimid 10mg  daily 2 weeks on 1 week off -he started on 10/31/23  He will be finishing 8 cycles of Dara Rvd. There is likely significant residual disease, M protein remained elevated around 1, no further reduction. Lamda light chain is trending up. He is not able to tolerate hight dosage of Revlimid  I recommend to repeat bone marrow biopsy for evaluation.   Continue Acyclovir, At risk of thrombosis, on Eliquis 5mg  BID for A fib Xegeva monthly if calcium meets criteria Recommend calcium and vitamin D supplementation.    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 There maybe a component of anemia of CKD.  Monitor.   Atrial fibrillation (HCC) Echo was done on 06/14/23 LVEF 50% Follow up with cardiology, s/p ablation.  Continue Eliquis 5mg  BID   CKD (chronic kidney disease) stage 3, GFR 30-59 ml/min (HCC) Encourage oral hydration and avoid nephrotoxins.    Encounter for antineoplastic chemotherapy Chemotherapy plan as listed above. - hold treatment for now due to recent Ileus. pending decision of BM transplant    Hypocalcemia Recommend patient to take calcium 1200mg  daily. Take vitamin D supplementation.  Check 25 hydroxy vitamin D level.    No orders of the defined types were placed in this encounter.  Follow-up per LOS  We spent sufficient time to discuss many aspect of care, questions were answered to patient's satisfaction.    [name], MD, PhD [facility] Hematology Oncology 11/08/2023     HISTORY OF PRESENTING ILLNESS:  Victor Phillips is a  68 y.o.  male with PMH listed below who was referred to me for anemia  02/11/2023 - 02/14/2023 recent hospitalization due to pneumonia, respiratory failure.  He was found to have a hemoglobin decreased at 6.8, status post PRBC transfusion during admission.  EGD showed gastritis.  Colonoscopy was not remarkable. 02/11/2023 TIBC 221 ferritin 107, iron saturation 16. Patient is currently taking fusion plus Vitamin B12 level in the 300s. His echo showed grade 2 diastolic CHF.  He denies recent  chest pain on exertion, shortness of breath on minimal exertion, pre-syncopal episodes, or palpitations He had not noticed any recent bleeding such as epistaxis, hematuria or hematochezia.  He denies over the counter NSAID ingestion.  Oncology History  Multiple myeloma (HCC)  04/14/2023 Initial Diagnosis   Multiple myeloma    03/13/2020 multiple myeloma panel showed M protein 4.3, IgA lambda Free lamda Level 142,-light chain ratio 0.07 04/05/2023 bone marrow biopsy showed hypercellular bone marrow with plasma cell neoplasm.The bone marrow is hypercellular for age with prominent increase in plasma cells representing 65% of all cells in the aspirate associated with interstitial infiltrates and numerous variably sized aggregates in  the clot and biopsy sections.  The plasma cells display lambda light chain restriction consistent with plasma cell neoplasm.  Normal cytogenetics.  FISH studies pending.    04/14/2023 Cancer Staging   Staging form: Plasma Cell Myeloma and Plasma Cell Disorders, AJCC 8th Edition - Clinical stage from 04/14/2023: RISS Stage II (Beta-2-microglobulin (mg/L): 8.4, Albumin (g/dL): 2.6, ISS: Stage III, High-risk cytogenetics: Absent, LDH: Normal) - Signed by [name], MD on 04/22/2023 Stage prefix: Initial diagnosis Beta 2 microglobulin range (mg/L): Greater than or equal to 5.5 Albumin range (g/dL): Less than 3.5 Cytogenetics: 1q addition, Other mutation   04/14/2023 Imaging   Skeletal survey 1. No lytic lesions or other intrinsic bony abnormality. 2. Borderline cardiomegaly with an interval decrease in size. 3. Diffuse atheromatous arterial calcifications including bilateral carotid artery calcifications, right greater than left   04/22/2023 -  Chemotherapy   Patient is on Treatment Plan : MYELOMA NEWLY DIAGNOSED TRANSPLANT CANDIDATE DaraVRd (Daratumumab SQ) q21d     04/25/2023 Imaging   PET scan showed  1. Two tiny hypermetabolic lucent lesions in the left posteromedial eighth and ninth ribs. No additional evidence of multiple myeloma. 2. Aortic atherosclerosis (ICD10-I70.0). Coronary artery calcification.   07/14/2023 - 07/19/2023 Hospital Admission   Hospitalized due to ileus.    07/25/2023 Bone Marrow Biopsy   A-C. Peripheral blood and bone marrow, (peripheral smear, aspirate smear, touch  preparation, clot section, core biopsy):   Persistent plasma cell myeloma, 50-60% lambda restricted plasma cells on core biopsy. Hypercellular bone marrow (70%) with trilineage hematopoiesis. Negative for increased blasts. Negative for significant dysplasia. Negative for amyloid deposition.     A Fib. Lowe extremity edema bilaterally, and SOB with exertion.  on Eliquis 5mg  BID, follows up with cardiology. Denies chest pain, abdominal pain, nausea, vomiting + Fatigue + bilateral lower extremity edema, he takes Lasix 20mg  daily PRN and edema has improved.  Diarrhea has stopped after utilizing imodium PRN as instructed.    MEDICAL HISTORY:  Past Medical History:  Diagnosis Date   Atrial fibrillation (HCC)    Diabetes mellitus without complication (HCC)    Hypercholesteremia    Hypertension    Multiple myeloma not having achieved remission (HCC)     SURGICAL HISTORY: Past Surgical History:  Procedure Laterality Date   CARDIOVERSION N/A 10/17/2023   Procedure: CARDIOVERSION;  Surgeon: Debbe Odea, MD;  Location: [facility] ORS;  Service: Cardiovascular;  Laterality: N/A;   COLONOSCOPY N/A 02/13/2023   Procedure: COLONOSCOPY;  Surgeon: Toledo, Boykin Nearing, MD;  Location: [facility] ENDOSCOPY;  Service: Gastroenterology;  Laterality: N/A;   COLONOSCOPY N/A 02/14/2023   Procedure: COLONOSCOPY;  Surgeon: Toledo, Boykin Nearing, MD;  Location: [facility] ENDOSCOPY;  Service: Gastroenterology;  Laterality: N/A;   ESOPHAGOGASTRODUODENOSCOPY N/A 02/13/2023   Procedure: ESOPHAGOGASTRODUODENOSCOPY (EGD);  Surgeon: Toledo, Boykin Nearing, MD;  Location: [facility] ENDOSCOPY;  Service: Gastroenterology;  Laterality: N/A;    SOCIAL HISTORY: Social History   Socioeconomic History   Marital status: Married    Spouse name: Not on file   Number of children: Not on file   Years of education: Not on file   Highest education level: Not on file  Occupational History   Not on file  Tobacco Use   Smoking status: Never    Smokeless tobacco: Never  Vaping Use   Vaping status: Never Used  Substance and Sexual Activity   Alcohol use: Not Currently    Comment: quit approx 2001   Drug use: No   Sexual activity: Not on file  Other Topics Concern   Not on file  Social History Narrative   Not on file   Social Drivers of Health   Financial Resource Strain: High Risk (10/12/2023)   Received from [facility]   Overall Financial Resource Strain (CARDIA)    Difficulty of Paying Living Expenses: Very hard  Food Insecurity: Food Insecurity Present (10/12/2023)   Received [facility]   Hunger Vital Sign    Worried About Running Out of Food in the Last Year: Sometimes true    Ran Out of Food in the Last Year: Sometimes true  Transportation Needs: No Transportation Needs (10/12/2023)   Received from [facility] - Transportation    In the past 12 months, has lack of transportation kept you from medical appointments or from getting medications?: No    Lack of Transportation (Non-Medical): No  Physical Activity: Insufficiently Active (10/12/2023)   Received from [facility]   Exercise Vital Sign    Days of Exercise per Week: 7 days    Minutes of Exercise per Session: 10 min  Stress: No Stress Concern Present (10/12/2023)   Received from [facility] - Occupational Stress Questionnaire    Feeling of Stress : Not at all  Recent Concern: Stress - Stress Concern Present (10/04/2023)   Received from [facility] - Occupational Stress Questionnaire    Feeling of Stress : Rather much  Social Connections: Moderately Integrated (10/12/2023)   Received from [facility]   Social Connection and Isolation Panel [NHANES]    Frequency of Communication with Friends and Family: More than three times a week     Frequency of Social Gatherings with Friends and Family: More than three times a week    Attends Religious Services: 1 to 4 times per year    Active Member of [employer] or Organizations: No    Attends Banker Meetings: Never    Marital Status: Married  Catering manager Violence: Not At Risk (07/14/2023)   Humiliation, Afraid, Rape, and Kick questionnaire    Fear of Current or Ex-Partner: No    Emotionally Abused: No    Physically Abused: No    Sexually Abused: No    FAMILY HISTORY: Family History  Problem Relation Age of Onset   Diabetes Mother    Heart attack Father     ALLERGIES:  has no known allergies.  MEDICATIONS:  Current Outpatient Medications  Medication Sig Dispense Refill   acetaminophen (TYLENOL) 325 MG tablet Take 2 tablets (650 mg total) by mouth every 6 (six) hours as needed for mild pain (or Fever >/= 101). 90 tablet 1   acyclovir (ZOVIRAX) 400 MG tablet Take 1 tablet (400  mg total) by mouth 2 (two) times daily. 60 tablet 5   apixaban (ELIQUIS) 5 MG TABS tablet Take 1 tablet (5 mg total) by mouth 2 (two) times daily. 180 tablet 0   atorvastatin (LIPITOR) 10 MG tablet Take 10 mg by mouth daily.     calcium carbonate (OS-CAL) 600 MG TABS tablet Take 2 tablets (1,200 mg total) by mouth daily.     cholecalciferol (VITAMIN D3) 25 MCG (1000 UNIT) tablet Take 1 tablet (1,000 Units total) by mouth daily.     furosemide (LASIX) 20 MG tablet Take 1 tablet (20 mg total) by mouth daily as needed for edema or fluid. 90 tablet 0   glipiZIDE (GLUCOTROL XL) 2.5 MG 24 hr tablet Take 5 mg by mouth daily with breakfast.     lenalidomide (REVLIMID) 10 MG capsule TAKE 1 CAPSULE BY MOUTH 1 TIME A DAY FOR 14 DAYS ON THEN 7 DAYS OFF, REPEAT EVERY 21 DAYS. 14 capsule 0   loperamide (IMODIUM) 2 MG capsule Take 1 capsule (2 mg total) by mouth See admin instructions. With onset of diarrhea, take 4 mg,then 2 mg every 2 hours or after every loose bowel movements  maximum: 16 mg/day 60  capsule 2   metoprolol succinate (TOPROL-XL) 50 MG 24 hr tablet Take 1 tablet (50 mg total) by mouth 2 (two) times daily. Take with or immediately following a meal. (Patient taking differently: Take 50 mg by mouth daily. Take with or immediately following a meal.) 180 tablet 3   Multiple Vitamin (MULTIVITAMIN WITH MINERALS) TABS tablet Take 1 tablet by mouth daily. 90 tablet 1   ondansetron (ZOFRAN) 8 MG tablet Take 1 tablet (8 mg total) by mouth every 8 (eight) hours as needed for nausea or vomiting. 30 tablet 1   pantoprazole (PROTONIX) 40 MG tablet Take 1 tablet (40 mg total) by mouth daily. 30 tablet 1   prochlorperazine (COMPAZINE) 10 MG tablet Take 1 tablet (10 mg total) by mouth every 6 (six) hours as needed for nausea or vomiting. 30 tablet 1   temazepam (RESTORIL) 15 MG capsule Take 15 mg by mouth at bedtime.     No current facility-administered medications for this visit.    Review of Systems  Constitutional:  Positive for fatigue. Negative for appetite change, chills, fever and unexpected weight change.  HENT:   Negative for hearing loss and voice change.   Eyes:  Negative for eye problems and icterus.  Respiratory:  Negative for chest tightness, cough and shortness of breath.   Cardiovascular:  Positive for leg swelling. Negative for chest pain.  Gastrointestinal:  Negative for abdominal distention, abdominal pain and diarrhea.  Endocrine: Negative for hot flashes.  Genitourinary:  Negative for difficulty urinating, dysuria and frequency.   Musculoskeletal:  Negative for arthralgias.  Skin:  Negative for itching and rash.  Neurological:  Negative for light-headedness and numbness.  Hematological:  Negative for adenopathy. Does not bruise/bleed easily.  Psychiatric/Behavioral:  Positive for sleep disturbance. Negative for confusion.     PHYSICAL EXAMINATION: Vitals:   11/08/23 1319  BP: 101/69  Pulse: 85  Resp: 18  Temp: (!) 96.5 F (35.8 C)   Filed Weights   11/08/23  1319  Weight: 174 lb 4.8 oz (79.1 kg)    Physical Exam Constitutional:      General: He is not in acute distress.    Appearance: He is obese.  HENT:     Head: Normocephalic and atraumatic.  Eyes:     General: No  scleral icterus. Cardiovascular:     Rate and Rhythm: Normal rate and regular rhythm.  Pulmonary:     Effort: Pulmonary effort is normal. No respiratory distress.     Breath sounds: No wheezing.  Abdominal:     General: Bowel sounds are normal. There is no distension.     Palpations: Abdomen is soft.  Musculoskeletal:        General: No deformity. Normal range of motion.     Cervical back: Normal range of motion and neck supple.     Right lower leg: Edema present.     Left lower leg: Edema present.  Skin:    General: Skin is warm and dry.     Findings: No erythema or rash.  Neurological:     Mental Status: He is alert and oriented to person, place, and time. Mental status is at baseline.     Cranial Nerves: No cranial nerve deficit.  Psychiatric:        Mood and Affect: Mood normal.      LABORATORY DATA:  I have reviewed the data as listed    Latest Ref Rng & Units 11/08/2023   12:51 PM 11/01/2023    7:53 AM 10/25/2023   11:53 AM  CBC  WBC 4.0 - 10.5 K/uL 4.3  3.8  7.8   Hemoglobin 13.0 - 17.0 g/dL 8.5  9.2  9.1   Hematocrit 39.0 - 52.0 % 25.9  28.8  27.8   Platelets 150 - 400 K/uL 124  158  56       Latest Ref Rng & Units 11/08/2023   12:52 PM 11/01/2023    7:53 AM 10/18/2023   10:15 AM  CMP  Glucose 70 - 99 mg/dL 528  413  244   BUN 8 - 23 mg/dL 26  28  26    Creatinine 0.61 - 1.24 mg/dL 0.10  2.72  5.36   Sodium 135 - 145 mmol/L 132  136  137   Potassium 3.5 - 5.1 mmol/L 4.2  4.1  3.8   Chloride 98 - 111 mmol/L 102  105  104   CO2 22 - 32 mmol/L 23  22  24    Calcium 8.9 - 10.3 mg/dL 8.7  8.4  8.3   Total Protein 6.5 - 8.1 g/dL 8.1  7.5  7.1   Total Bilirubin 0.0 - 1.2 mg/dL 0.6  0.8  0.7   Alkaline Phos 38 - 126 U/L 80  101  101   AST 15 - 41 U/L  28  23  26    ALT 0 - 44 U/L 36  39  38    Lab Results  Component Value Date   IRON 100 03/14/2023   TIBC 272 03/14/2023   IRONPCTSAT 37 03/14/2023   FERRITIN 249 03/14/2023     RADIOGRAPHIC STUDIES: I have personally reviewed the radiological images as listed and agreed with the findings in the report. No results found.

## 2023-11-08 NOTE — Assessment & Plan Note (Addendum)
 Bone marrow biopsy was reviewed. 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 Cycle 3 D15 Daratumumab was held due to cytopenia and being on anticoagulation.  S/p 4 cycles  Daratumumab and Velcade with revlimid 10mg  2 weeks on 1 week off  Cycle 4 D15 missed due to hospitalization due to ileus. BM biopsy at [facility] after 4 cycles showed 50-60% plasma cells.  Labs are reviewed and discussed with patient.  [facility]. Recommend additional 3-4 cycles.  Proceed with cycle 8 D8 Dara Rvd  start Revlimid 10mg  daily 2 weeks on 1 week off -he started on 10/31/23  He will be finishing 8 cycles of Dara Rvd. There is likely significant residual disease, M protein remained elevated around 1, no further reduction. Lamda light chain is trending up. He is not able to tolerate hight dosage of Revlimid  I recommend to repeat bone marrow biopsy for evaluation.   Continue Acyclovir, At risk of thrombosis, on Eliquis 5mg  BID for A fib Xegeva monthly if calcium meets criteria Recommend calcium and vitamin D supplementation.   Follow up with [facility] in the future evaluation for BM transplant - no amyloidosis on cardiac MRI

## 2023-11-09 ENCOUNTER — Other Ambulatory Visit (HOSPITAL_COMMUNITY): Payer: Self-pay

## 2023-11-09 ENCOUNTER — Telehealth: Payer: Self-pay

## 2023-11-09 NOTE — Telephone Encounter (Signed)
 error

## 2023-11-10 ENCOUNTER — Other Ambulatory Visit: Payer: Self-pay | Admitting: Oncology

## 2023-11-10 ENCOUNTER — Telehealth: Payer: Self-pay | Admitting: Pharmacist

## 2023-11-10 DIAGNOSIS — C9000 Multiple myeloma not having achieved remission: Secondary | ICD-10-CM

## 2023-11-10 MED ORDER — WARFARIN SODIUM 5 MG PO TABS: 5.0000 mg | ORAL_TABLET | Freq: Every day | ORAL | 0 refills | Status: DC

## 2023-11-10 NOTE — Telephone Encounter (Signed)
-----   Message from Nurse Carlyle C sent at 11/08/2023 10:49 AM EST ----- Good Morning,  Patient is unable to afford Eliquis. Not sure that he will be able to afford anything but coumadin. Could you all look into any assistance or what his best option for anticoagulation is?  Thanks! Carly

## 2023-11-10 NOTE — Telephone Encounter (Signed)
 Patient has 25% copay for Eliquis.Cannot afford, nor can he afford dabigatran. Will transition to warfarin. He has 4 weeks of samples that he started at the beginning of the week.  I have asked him to start taking warfarin when he has 3 days of Eliquis left. He is scheduled in [facility] 3/26 @ 10:30.

## 2023-11-11 ENCOUNTER — Inpatient Hospital Stay: Payer: Medicare HMO

## 2023-11-11 ENCOUNTER — Telehealth: Payer: Self-pay

## 2023-11-11 VITALS — BP 134/83 | HR 97 | Temp 97.2°F | Resp 16

## 2023-11-11 DIAGNOSIS — C9000 Multiple myeloma not having achieved remission: Secondary | ICD-10-CM

## 2023-11-11 DIAGNOSIS — Z5112 Encounter for antineoplastic immunotherapy: Secondary | ICD-10-CM | POA: Diagnosis not present

## 2023-11-11 DIAGNOSIS — R768 Other specified abnormal immunological findings in serum: Secondary | ICD-10-CM

## 2023-11-11 MED ORDER — BORTEZOMIB CHEMO SQ INJECTION 3.5 MG (2.5MG/ML)
1.3000 mg/m2 | Freq: Once | INTRAMUSCULAR | Status: AC
  Administered 2023-11-11: 2.5 mg via SUBCUTANEOUS
  Filled 2023-11-11: qty 1

## 2023-11-11 MED ORDER — PROCHLORPERAZINE MALEATE 10 MG PO TABS
10.0000 mg | ORAL_TABLET | Freq: Once | ORAL | Status: AC
  Administered 2023-11-11: 10 mg via ORAL
  Filled 2023-11-11: qty 1

## 2023-11-11 NOTE — Telephone Encounter (Signed)
  Spoke to Victor Phillips and informed him that Dr. Cathie Hoops has talked to Dr. Jacqulyn Bath and they both agreed on Bone marrow biopsy. Pt verbalized understanding.   Request for BmBx has been sent and pt will be contacted once we have a date.

## 2023-11-11 NOTE — Patient Instructions (Signed)
 [facility] - A DEPT OF [facility]  Discharge Instructions: Thank you for choosing [facility] to provide your oncology and hematology care.  If you have a lab appointment with the Cancer Center, please go directly to the Cancer Center and check in at the registration area.  Wear comfortable clothing and clothing appropriate for easy access to any Portacath or PICC line.   We strive to give you quality time with your provider. You may need to reschedule your appointment if you arrive late (15 or more minutes).  Arriving late affects you and other patients whose appointments are after yours.  Also, if you miss three or more appointments without notifying the office, you may be dismissed from the clinic at the provider's discretion.      For prescription refill requests, have your pharmacy contact our office and allow 72 hours for refills to be completed.    Today you received the following chemotherapy and/or immunotherapy agents Velcade      To help prevent nausea and vomiting after your treatment, we encourage you to take your nausea medication as directed.  BELOW ARE SYMPTOMS THAT SHOULD BE REPORTED IMMEDIATELY: *FEVER GREATER THAN 100.4 F (38 C) OR HIGHER *CHILLS OR SWEATING *NAUSEA AND VOMITING THAT IS NOT CONTROLLED WITH YOUR NAUSEA MEDICATION *UNUSUAL SHORTNESS OF BREATH *UNUSUAL BRUISING OR BLEEDING *URINARY PROBLEMS (pain or burning when urinating, or frequent urination) *BOWEL PROBLEMS (unusual diarrhea, constipation, pain near the anus) TENDERNESS IN MOUTH AND THROAT WITH OR WITHOUT PRESENCE OF ULCERS (sore throat, sores in mouth, or a toothache) UNUSUAL RASH, SWELLING OR PAIN  UNUSUAL VAGINAL DISCHARGE OR ITCHING   Items with * indicate a potential emergency and should be followed up as soon as possible or go to the Emergency Department if any problems should occur.  Please show the CHEMOTHERAPY ALERT CARD or IMMUNOTHERAPY  ALERT CARD at check-in to the Emergency Department and triage nurse.  Should you have questions after your visit or need to cancel or reschedule your appointment, please contact [facility] - A DEPT OF [facility]  [phone] and follow the prompts.  Office hours are 8:00 a.m. to 4:30 p.m. Monday - Friday. Please note that voicemails left after 4:00 p.m. may not be returned until the following business day.  We are closed weekends and major holidays. You have access to a nurse at all times for urgent questions. Please call the main number to the clinic [phone] and follow the prompts.  For any non-urgent questions, you may also contact your provider using MyChart. We now offer e-Visits for anyone 19 and older to request care online for non-urgent symptoms. For details visit [email or web address].   Also download the MyChart app! Go to the app store, search "MyChart", open the app, select [place], and log in with your MyChart username and password.

## 2023-11-14 ENCOUNTER — Encounter: Payer: Self-pay | Admitting: Oncology

## 2023-11-14 NOTE — Telephone Encounter (Signed)
 Pt scheduled for BMBx on 3/13 at 8:30a with [facility]. Pt aware of appt details.

## 2023-11-17 ENCOUNTER — Encounter: Payer: Self-pay | Admitting: Oncology

## 2023-11-23 ENCOUNTER — Other Ambulatory Visit: Payer: Self-pay | Admitting: Radiology

## 2023-11-23 DIAGNOSIS — C9000 Multiple myeloma not having achieved remission: Secondary | ICD-10-CM

## 2023-11-23 NOTE — H&P (Signed)
 Chief Complaint: Patient was seen in consultation today for multiple myeloma; bone marrow biopsy with aspiration.   Referring Physician(s): Yu,Zhou  Supervising Physician: Roanna Banning  Patient Status: [facility] - Out-pt  History of Present Illness: Victor Phillips is a 68 y.o. male with a medical history significant for atrial fibrillation (Eliquis), DM, HTN and multiple myeloma not having achieved remission. He is familiar to IR from a bone marrow biopsy with aspiration 04/05/23. He has been receiving systemic treatment and had a bone marrow biopsy at [facility] after 4 cycles. This showed 50-60% plasma cells.   He has now had approximately 8 cycles of treatment and his oncology team has requested a repeat bone marrow biopsy with aspiration to assess treatment response.   Past Medical History:  Diagnosis Date   Atrial fibrillation (HCC)    Diabetes mellitus without complication (HCC)    Hypercholesteremia    Hypertension    Multiple myeloma not having achieved remission [place])     Past Surgical History:  Procedure Laterality Date   CARDIOVERSION N/A 10/17/2023   Procedure: CARDIOVERSION;  Surgeon: Debbe Odea, MD;  Location: [facility] ORS;  Service: Cardiovascular;  Laterality: N/A;   COLONOSCOPY N/A 02/13/2023   Procedure: COLONOSCOPY;  Surgeon: Toledo, Boykin Nearing, MD;  Location: [facility] ENDOSCOPY;  Service: Gastroenterology;  Laterality: N/A;   COLONOSCOPY N/A 02/14/2023   Procedure: COLONOSCOPY;  Surgeon: Toledo, Boykin Nearing, MD;  Location: [facility] ENDOSCOPY;  Service: Gastroenterology;  Laterality: N/A;   ESOPHAGOGASTRODUODENOSCOPY N/A 02/13/2023   Procedure: ESOPHAGOGASTRODUODENOSCOPY (EGD);  Surgeon: Toledo, Boykin Nearing, MD;  Location: [facility] ENDOSCOPY;  Service: Gastroenterology;  Laterality: N/A;    Allergies: Patient has no known allergies.  Medications: Prior to Admission medications   Medication Sig Start Date End Date Taking? Authorizing Provider  acetaminophen (TYLENOL) 325 MG tablet  Take 2 tablets (650 mg total) by mouth every 6 (six) hours as needed for mild pain (or Fever >/= 101). 02/14/23   Arnetha Courser, MD  acyclovir (ZOVIRAX) 400 MG tablet Take 1 tablet (400 mg total) by mouth 2 (two) times daily. 04/14/23   [name], MD  apixaban (ELIQUIS) 5 MG TABS tablet Take 1 tablet (5 mg total) by mouth 2 (two) times daily. 08/01/23   Debbe Odea, MD  atorvastatin (LIPITOR) 10 MG tablet Take 10 mg by mouth daily. 11/23/18   [provider]  calcium carbonate (OS-CAL) 600 MG TABS tablet Take 2 tablets (1,200 mg total) by mouth daily. 05/20/23   [name], MD  cholecalciferol (VITAMIN D3) 25 MCG (1000 UNIT) tablet Take 1 tablet (1,000 Units total) by mouth daily. 05/20/23   [name], MD  furosemide (LASIX) 20 MG tablet Take 1 tablet (20 mg total) by mouth daily as needed for edema or fluid. 08/29/23   [name], MD  glipiZIDE (GLUCOTROL XL) 2.5 MG 24 hr tablet Take 5 mg by mouth daily with breakfast. 04/14/23 04/13/24  [provider]  lenalidomide (REVLIMID) 10 MG capsule TAKE 1 CAPSULE BY MOUTH 1 TIME A DAY FOR 14 DAYS ON THEN 7 DAYS OFF, REPEAT EVERY 21 DAYS. 11/10/23   [name], MD  loperamide (IMODIUM) 2 MG capsule Take 1 capsule (2 mg total) by mouth See admin instructions. With onset of diarrhea, take 4 mg,then 2 mg every 2 hours or after every loose bowel movements  maximum: 16 mg/day 10/03/23   [name], MD  metoprolol succinate (TOPROL-XL) 50 MG 24 hr tablet Take 1 tablet (50 mg total) by mouth 2 (two) times daily.  Take with or immediately following a meal. Patient taking differently: Take 50 mg by mouth daily. Take with or immediately following a meal. 10/06/23 01/04/24  Debbe Odea, MD  Multiple Vitamin (MULTIVITAMIN WITH MINERALS) TABS tablet Take 1 tablet by mouth daily. 02/15/23   Arnetha Courser, MD  ondansetron (ZOFRAN) 8 MG tablet Take 1 tablet (8 mg total) by mouth every 8 (eight) hours as needed for nausea or vomiting. 04/14/23   [name], MD  pantoprazole  (PROTONIX) 40 MG tablet Take 1 tablet (40 mg total) by mouth daily. 07/19/23 07/18/24  Regalado, Jon Billings A, MD  prochlorperazine (COMPAZINE) 10 MG tablet Take 1 tablet (10 mg total) by mouth every 6 (six) hours as needed for nausea or vomiting. 04/14/23   [name], MD  temazepam (RESTORIL) 15 MG capsule Take 15 mg by mouth at bedtime. 05/05/23 11/01/23  [provider]  warfarin (COUMADIN) 5 MG tablet Take 1 tablet (5 mg total) by mouth daily. Start when you have 3 days of Eliquis left. Overlap for 3 days 11/10/23   Nobie Putnam, MD     Family History  Problem Relation Age of Onset   Diabetes Mother    Heart attack Father     Social History   Socioeconomic History   Marital status: Married    Spouse name: Not on file   Number of children: Not on file   Years of education: Not on file   Highest education level: Not on file  Occupational History   Not on file  Tobacco Use   Smoking status: Never   Smokeless tobacco: Never  Vaping Use   Vaping status: Never Used  Substance and Sexual Activity   Alcohol use: Not Currently    Comment: quit approx 2001   Drug use: No   Sexual activity: Not on file  Other Topics Concern   Not on file  Social History Narrative   Not on file   Social Drivers of Health   Financial Resource Strain: High Risk (10/12/2023)   Received from [facility]   Overall Financial Resource Strain (CARDIA)    Difficulty of Paying Living Expenses: Very hard  Food Insecurity: Food Insecurity Present (10/12/2023)   Received from [facility]   Hunger Vital Sign    Worried About Running Out of Food in the Last Year: Sometimes true    Ran Out of Food in the Last Year: Sometimes true  Transportation Needs: No Transportation Needs (10/12/2023)   Received from [facility] - Transportation    In the past 12 months, has lack of transportation kept you from medical appointments or from getting  medications?: No    Lack of Transportation (Non-Medical): No  Physical Activity: Insufficiently Active (10/12/2023)   Received from [facility]   Exercise Vital Sign    Days of Exercise per Week: 7 days    Minutes of Exercise per Session: 10 min  Stress: No Stress Concern Present (10/12/2023)   Received from [facility] - Occupational Stress Questionnaire    Feeling of Stress : Not at all  Recent Concern: Stress - Stress Concern Present (10/04/2023)   Received from [facility] - Occupational Stress Questionnaire    Feeling of Stress : Rather much  Social Connections: Moderately Integrated (10/12/2023)   Received from [facility]   Social Connection  and Isolation Panel [NHANES]    Frequency of Communication with Friends and Family: More than three times a week    Frequency of Social Gatherings with Friends and Family: More than three times a week    Attends Religious Services: 1 to 4 times per year    Active Member of [employer] or Organizations: No    Attends Banker Meetings: Never    Marital Status: Married    Review of Systems: A 12 point ROS discussed and pertinent positives are indicated in the HPI above.  All other systems are negative.  Review of Systems  Vital Signs: There were no vitals taken for this visit.  Physical Exam  Imaging: No results found.  Labs:  CBC: Recent Labs    10/18/23 1016 10/25/23 1153 11/01/23 0753 11/08/23 1251  WBC 9.1 7.8 3.8* 4.3  HGB 10.1* 9.1* 9.2* 8.5*  HCT 30.1* 27.8* 28.8* 25.9*  PLT 94* 56* 158 124*    COAGS: Recent Labs    02/11/23 0906 07/14/23 0740  INR 1.3* 1.3*  APTT 31 26    BMP: Recent Labs    10/10/23 0801 10/18/23 1015 11/01/23 0753 11/08/23 1252  NA 136 137 136 132*  K 4.4 3.8 4.1 4.2  CL 101 104 105 102  CO2 26 24 22 23   GLUCOSE 215*  195* 169* 112*  BUN 24* 26* 28* 26*  CALCIUM 9.6 8.3* 8.4* 8.7*  CREATININE 0.82 1.05 0.99 0.99  GFRNONAA >60 >60 >60 >60    LIVER FUNCTION TESTS: Recent Labs    10/10/23 0801 10/18/23 1015 11/01/23 0753 11/08/23 1252  BILITOT 0.6 0.7 0.8 0.6  AST 18 26 23 28   ALT 23 38 39 36  ALKPHOS 92 101 101 80  PROT 7.8 7.1 7.5 8.1  ALBUMIN 3.8 3.5 3.6 3.4*    TUMOR MARKERS: No results for input(s): "AFPTM", "CEA", "CA199", "CHROMGRNA" in the last 8760 hours.  Assessment and Plan:  Multiple myeloma not having achieve remission: Victor Phillips, 68 year old male, presents today to the [facility] Interventional Radiology department for an image-guided bone marrow biopsy with aspiration.   Risks and benefits of this procedure discussed with the patient and/or patient's family including, but not limited to bleeding, infection, damage to adjacent structures or low yield requiring additional tests.  All of the questions were answered and there is agreement to proceed. He has been NPO. He is a full code.   Consent signed and in chart.  Thank you for this interesting consult.  I greatly enjoyed meeting Victor Phillips and look forward to participating in their care.  A copy of this report was sent to the requesting provider on this date.  Electronically Signed: Alwyn Ren, AGACNP-BC 11/23/2023, 7:35 PM   I spent a total of  30 Minutes   in face to face in clinical consultation, greater than 50% of which was counseling/coordinating care for multiple myeloma.

## 2023-11-23 NOTE — Progress Notes (Signed)
 Patient for IR Bone Marrow Biopsy on Thurs 11/24/23, I called and spoke with the patient on the phone and gave pre-procedure instructions. Pt was made aware to be here at 7:30a, NPO after MN prior to procedure as well as driver post procedure/recovery/discharge. Pt stated understanding.  Called 11/23/23

## 2023-11-24 ENCOUNTER — Ambulatory Visit
Admission: RE | Admit: 2023-11-24 | Discharge: 2023-11-24 | Disposition: A | Discharge status: Home / Self Care | Source: Ambulatory Visit | Attending: Oncology | Admitting: Oncology

## 2023-11-24 ENCOUNTER — Other Ambulatory Visit: Payer: Self-pay

## 2023-11-24 DIAGNOSIS — R768 Other specified abnormal immunological findings in serum: Secondary | ICD-10-CM

## 2023-11-24 DIAGNOSIS — Z7901 Long term (current) use of anticoagulants: Secondary | ICD-10-CM | POA: Insufficient documentation

## 2023-11-24 DIAGNOSIS — Z1379 Encounter for other screening for genetic and chromosomal anomalies: Secondary | ICD-10-CM | POA: Insufficient documentation

## 2023-11-24 DIAGNOSIS — Z7984 Long term (current) use of oral hypoglycemic drugs: Secondary | ICD-10-CM | POA: Diagnosis not present

## 2023-11-24 DIAGNOSIS — I10 Essential (primary) hypertension: Secondary | ICD-10-CM | POA: Insufficient documentation

## 2023-11-24 DIAGNOSIS — E119 Type 2 diabetes mellitus without complications: Secondary | ICD-10-CM | POA: Insufficient documentation

## 2023-11-24 DIAGNOSIS — I4891 Unspecified atrial fibrillation: Secondary | ICD-10-CM | POA: Insufficient documentation

## 2023-11-24 DIAGNOSIS — C9000 Multiple myeloma not having achieved remission: Secondary | ICD-10-CM | POA: Diagnosis present

## 2023-11-24 HISTORY — PX: IR BONE MARROW BIOPSY & ASPIRATION: IMG5727

## 2023-11-24 LAB — CBC WITH DIFFERENTIAL/PLATELET
Abs Immature Granulocytes: 0.03 10*3/uL (ref 0.00–0.07)
Basophils Absolute: 0 10*3/uL (ref 0.0–0.1)
Basophils Relative: 1 %
Eosinophils Absolute: 0.1 10*3/uL (ref 0.0–0.5)
Eosinophils Relative: 2 %
HCT: 27.3 % — ABNORMAL LOW (ref 39.0–52.0)
Hemoglobin: 8.9 g/dL — ABNORMAL LOW (ref 13.0–17.0)
Immature Granulocytes: 1 %
Lymphocytes Relative: 11 %
Lymphs Abs: 0.5 10*3/uL — ABNORMAL LOW (ref 0.7–4.0)
MCH: 33.7 pg (ref 26.0–34.0)
MCHC: 32.6 g/dL (ref 30.0–36.0)
MCV: 103.4 fL — ABNORMAL HIGH (ref 80.0–100.0)
Monocytes Absolute: 0.5 10*3/uL (ref 0.1–1.0)
Monocytes Relative: 12 %
Neutro Abs: 3.2 10*3/uL (ref 1.7–7.7)
Neutrophils Relative %: 73 %
Platelets: 146 10*3/uL — ABNORMAL LOW (ref 150–400)
RBC: 2.64 MIL/uL — ABNORMAL LOW (ref 4.22–5.81)
RDW: 16.6 % — ABNORMAL HIGH (ref 11.5–15.5)
WBC: 4.3 10*3/uL (ref 4.0–10.5)
nRBC: 0 % (ref 0.0–0.2)

## 2023-11-24 LAB — PROTIME-INR
INR: 1.2 (ref 0.8–1.2)
Prothrombin Time: 15.3 s — ABNORMAL HIGH (ref 11.4–15.2)

## 2023-11-24 LAB — GLUCOSE, CAPILLARY: Glucose-Capillary: 110 mg/dL — ABNORMAL HIGH (ref 70–99)

## 2023-11-24 MED ORDER — MIDAZOLAM HCL 2 MG/2ML IJ SOLN
INTRAMUSCULAR | Status: AC
  Filled 2023-11-24: qty 2

## 2023-11-24 MED ORDER — FENTANYL CITRATE (PF) 100 MCG/2ML IJ SOLN
INTRAMUSCULAR | Status: AC | PRN
  Administered 2023-11-24: 50 ug via INTRAVENOUS

## 2023-11-24 MED ORDER — SODIUM CHLORIDE 0.9 % IV SOLN: INTRAVENOUS | Status: DC

## 2023-11-24 MED ORDER — MIDAZOLAM HCL 2 MG/2ML IJ SOLN
INTRAMUSCULAR | Status: AC | PRN
  Administered 2023-11-24: 1 mg via INTRAVENOUS

## 2023-11-24 MED ORDER — LIDOCAINE 1 % OPTIME INJ - NO CHARGE
5.0000 mL | Freq: Once | INTRAMUSCULAR | Status: DC
  Filled 2023-11-24: qty 6

## 2023-11-24 MED ORDER — HEPARIN SOD (PORK) LOCK FLUSH 100 UNIT/ML IV SOLN
INTRAVENOUS | Status: AC
  Filled 2023-11-24: qty 5

## 2023-11-24 MED ORDER — FENTANYL CITRATE (PF) 100 MCG/2ML IJ SOLN
INTRAMUSCULAR | Status: AC
  Filled 2023-11-24: qty 2

## 2023-11-24 NOTE — Procedures (Signed)
 Vascular and Interventional Radiology Procedure Note  Patient: Victor Phillips DOB: 11-Nov-1955 Medical Record Number: [id number] Note Date/Time: 11/24/23 8:31 AM   Performing Physician: Roanna Banning, MD Assistant(s): None  Diagnosis: Hx myeloma   Procedure: BONE MARROW ASPIRATION and BIOPSY  Anesthesia: Conscious Sedation Complications: None Estimated Blood Loss: Minimal Specimens: Sent for Pathology  Findings:  Successful Fluoroscopy-guided bone marrow aspiration and biopsy A total of 1 cores were obtained. Hemostasis of the tract was achieved using Manual Pressure.  Plan: Bed rest for 1 hours.  See detailed procedure note with images in PACS. The patient tolerated the procedure well without incident or complication and was returned to Recovery in stable condition.    Roanna Banning, MD [facility]   Pager. [phone] Clinic. [phone]

## 2023-11-28 LAB — SURGICAL PATHOLOGY

## 2023-11-29 ENCOUNTER — Telehealth: Payer: Self-pay

## 2023-11-29 NOTE — Telephone Encounter (Signed)
-----   Message from [name] sent at 11/28/2023  9:07 PM EDT ----- Please forward his bone marrow biopsy result to [facility] Dr. Verita Schneiders Douglas's office. Please check if he has a follow up appt with Dr. Jacqulyn Bath. Thanks.

## 2023-11-29 NOTE — Telephone Encounter (Signed)
 Biopsy results faxed to Dr. Edwin Dada office. No appt scheduled.

## 2023-12-02 NOTE — Telephone Encounter (Signed)
 Per Dr Cathie Hoops "Please call Dr. Edwin Dada office and see if pt can be scheduled an appt to see Dr. Jacqulyn Bath. Previously discussed with Dr. Jacqulyn Bath who plans to see him after bone marrow biopsy. "  Called [facility] and spoke to Triage nurse [facility] and informed her of pt needing to see Dr. Jacqulyn Bath. She stated that she would forward message to Dr. Edwin Dada team and to contact pt for an appt.

## 2023-12-05 ENCOUNTER — Encounter (HOSPITAL_COMMUNITY): Payer: Self-pay | Admitting: Oncology

## 2023-12-05 NOTE — Progress Notes (Unsigned)
 Electrophysiology Clinic Note    Date:  12/06/2023  Patient ID:  Victor Phillips 03-01-1956, MRN [id number] PCP:  Danella Penton, MD  Cardiologist:  Debbe Odea, MD Electrophysiologist: Nobie Putnam, MD   Discussed the use of AI scribe software for clinical note transcription with the patient, who gave verbal consent to proceed.   Patient Profile    Chief Complaint: AFib follow-up  History of Present Illness: Victor Phillips is a 68 y.o. male with PMH notable for persis AFib, HTN, HLD, T2DM, multiple myeloma; seen today for Nobie Putnam, MD for routine electrophysiology followup.   He is s/p DCCV 2/3 with conversion to SR.   He last saw Dr. Jimmey Ralph 11/08/2023 for initial consult of AFib. He was in AFib at that appt, unclear when he converted. He is receiving chemo for his MM with ongoing DOE. He was concerned about affordability of eliquis. Dr. Jimmey Ralph did not recommend invasive EP procedures. Can consider tikosyn in future. He has met with CPP, and will be transitioned to warfarin.    Unfortunately, he was not aware of  how to transition to warfarin and did not pick up script from pharmacy. His last dose of eliquis was 3/23. He believes eliquis is ~140/month.  He fell this morning, tripped over curbing at [facility]. He denies dizziness, LH, presyncope prior to fall. His wife witnessed fall and confirms he tripped over his feet with the curb. He has had worsening balance since receiving treatment for MM, though the balance is improving slowly. He did strike the back of his head with today's fall. No LOC, denies visual changes, HA, nausea, vomiting.   He denies afib symptoms, he can tell that his heart is beating slightly irregularly every now and then, but no strong palpitations, chest pain, chest pressure, SOB. He denies lower extremity edema.      AAD History: None     ROS:  Please see the history of present illness. All other systems are reviewed and otherwise  negative.    Physical Exam    VS:  BP 127/69 (BP Location: Left Arm, Patient Position: Sitting, Cuff Size: Normal)   Pulse (!) 102   Ht 5\' 7"  (1.702 m)   Wt 168 lb 6.4 oz (76.4 kg)   SpO2 94%   BMI 26.38 kg/m  BMI: Body mass index is 26.38 kg/m.  Wt Readings from Last 3 Encounters:  12/06/23 168 lb 6.4 oz (76.4 kg)  11/24/23 174 lb 2.6 oz (79 kg)  11/08/23 174 lb 4.8 oz (79.1 kg)     GEN- The patient is well appearing, alert and oriented x 3 today.   Lungs- Clear to ausculation bilaterally, normal work of breathing.  Heart- Irregularly irregular rate and rhythm, no murmurs, rubs or gallops Extremities- No peripheral edema, warm, dry    Studies Reviewed   Previous EP, cardiology notes.    EKG is ordered. Personal review of EKG from today shows:    EKG Interpretation Date/Time:  Tuesday December 06 2023 09:17:39 EDT Ventricular Rate:  102 PR Interval:    QRS Duration:  90 QT Interval:  380 QTC Calculation: 495 R Axis:   99  Text Interpretation: Atrial fibrillation with rapid ventricular response Rightward axis T wave abnormality, consider inferior ischemia Confirmed by Sherie Don [phone]) on 12/06/2023 9:42:59 AM     Cardiac MRI, 07/27/2023 1.  Normal LV size, low normal systolic function.  LVEF 50%  2.  No LV LGE or scar.  3.  Normal ECV, no evidence for infiltrative disease.  4.  No significant valvular abnormalities.  5.  Normal RV systolic function.  6.  No evidence for cardiac amyloidosis.  TTE, 06/14/2023  1. Left ventricular ejection fraction, by estimation, is 50 to 55%. The left ventricle has low normal function. The left ventricle has no regional wall motion abnormalities. There is mild left ventricular hypertrophy. Left ventricular diastolic function  could not be evaluated.   2. Right ventricular systolic function is normal. The right ventricular size is normal. There is moderately elevated pulmonary artery systolic pressure.   3. Right atrial size was  mildly dilated.   4. The mitral valve is normal in structure. Mild mitral valve regurgitation.   5. The aortic valve is tricuspid. Aortic valve regurgitation is not visualized. No aortic stenosis is present.   6. The inferior vena cava is normal in size with <50% respiratory variability, suggesting right atrial pressure of 8 mmHg.   TTE, 02/13/2023  1. Left ventricular ejection fraction, by estimation, is 60 to 65%. The left ventricle has normal function. The left ventricle has no regional wall motion abnormalities. Left ventricular diastolic parameters are consistent with Grade II diastolic dysfunction (pseudonormalization). Elevated left ventricular end-diastolic pressure. The average left ventricular global longitudinal strain is -19.2 %. The global longitudinal strain is normal.   2. Right ventricular systolic function is normal. The right ventricular size is normal. There is normal pulmonary artery systolic pressure.   3. The mitral valve is normal in structure. Trivial mitral valve regurgitation. No evidence of mitral stenosis.   4. The aortic valve is tricuspid. Aortic valve regurgitation is not visualized. No aortic stenosis is present.   5. There is dilatation of the ascending aorta, measuring 38 mm.   6. The inferior vena cava is dilated in size with >50% respiratory variability, suggesting right atrial pressure of 8 mmHg.    Assessment and Plan     #) persis AFib Relatively asymptomatic Good ventricular rate control   #) Hypercoag d/t persis afib CHA2DS2-VASc Score = at least 3 [CHF History: 0, HTN History: 1, Diabetes History: 1, Stroke History: 0, Vascular Disease History: 0, Age Score: 1, Gender Score: 0].  Therefore, the patient's annual risk of stroke is 3.2 %.   `  Discussed with CPP, who sent test claim for eliquis to confirm copay; is $0 for eliquis at [facility] outpatient pharmacy.  Patient was able to restart eliquis today Will cancel coumadin clinic appt scheduled for  tomorrow   #) Fall Mechanical fall occurred without lightheadedness or dizziness. Head impact without headache or immediate symptoms.  - Monitor for signs of concussion or mental status changes. - Advise emergency care if increased sleepiness, confusion, vision changes, or unilateral weakness occur.        Current medicines are reviewed at length with the patient today.   The patient has concerns regarding his medicines.  The following changes were made today:   DO NOT START warfarin START 5mg  eliquis BID  Labs/ tests ordered today include:  Orders Placed This Encounter  Procedures   EKG 12-Lead     Disposition: Follow up with Dr. Jimmey Ralph or EP APP in 4 weeks for close follow-up of AFib mgmt   Signed, Sherie Don, NP  12/06/23  12:13 PM  Electrophysiology [facility]

## 2023-12-06 ENCOUNTER — Telehealth (HOSPITAL_COMMUNITY): Payer: Self-pay | Admitting: Pharmacy Technician

## 2023-12-06 ENCOUNTER — Other Ambulatory Visit (HOSPITAL_COMMUNITY): Payer: Self-pay

## 2023-12-06 ENCOUNTER — Ambulatory Visit: Payer: Medicare HMO | Attending: Cardiology | Admitting: Cardiology

## 2023-12-06 ENCOUNTER — Other Ambulatory Visit: Payer: Self-pay

## 2023-12-06 VITALS — BP 127/69 | HR 102 | Ht 67.0 in | Wt 168.4 lb

## 2023-12-06 DIAGNOSIS — I4819 Other persistent atrial fibrillation: Secondary | ICD-10-CM

## 2023-12-06 DIAGNOSIS — W19XXXA Unspecified fall, initial encounter: Secondary | ICD-10-CM

## 2023-12-06 DIAGNOSIS — D6869 Other thrombophilia: Secondary | ICD-10-CM | POA: Diagnosis not present

## 2023-12-06 MED ORDER — APIXABAN 5 MG PO TABS
5.0000 mg | ORAL_TABLET | Freq: Two times a day (BID) | ORAL | 3 refills | Status: DC
  Filled 2023-12-06: qty 180, 90d supply, fill #0

## 2023-12-06 MED ORDER — APIXABAN 5 MG PO TABS
5.0000 mg | ORAL_TABLET | Freq: Two times a day (BID) | ORAL | 3 refills | Status: DC
  Filled 2023-12-06 – 2024-03-12 (×2): qty 60, 30d supply, fill #0

## 2023-12-06 NOTE — Telephone Encounter (Signed)
 Patient Product/process development scientist completed.    The patient is insured through [employer]. Patient has Medicare and is not eligible for a copay card, but may be able to apply for patient assistance or Medicare RX Payment Plan (Patient Must reach out to their plan, if eligible for payment plan), if available.    Ran test claim for Eliquis 5 mg and the current 30 day co-pay is $0.00.  Ran test claim for enoxaparin (Lovenox) 80 mg and the current 5 day co-pay is $0.00.  This test claim was processed through [facility]- copay amounts may vary at other pharmacies due to pharmacy/plan contracts, or as the patient moves through the different stages of their insurance plan.     Roland Earl, CPHT Pharmacy Technician III Certified Patient Advocate [facility] Patient Advocate Team Direct Number: [phone]  Fax: [phone]

## 2023-12-06 NOTE — Patient Instructions (Signed)
 Medication Instructions:  Pick up Eliquis 5 mg twice daily at the [facility].   *please call me Sherie Don, NP or Raynelle Fanning, LPN) so we can update your medication list and appointments*  *If you need a refill on your cardiac medications before your next appointment, please call your pharmacy*   Follow-Up: At [facility], you and your health needs are our priority.  As part of our continuing mission to provide you with exceptional heart care, we have created designated Provider Care Teams.  These Care Teams include your primary Cardiologist (physician) and Advanced Practice Providers (APPs -  Physician Assistants and Nurse Practitioners) who all work together to provide you with the care you need, when you need it.  We recommend signing up for the patient portal called "MyChart".  Sign up information is provided on this After Visit Summary.  MyChart is used to connect with patients for Virtual Visits (Telemedicine).  Patients are able to view lab/test results, encounter notes, upcoming appointments, etc.  Non-urgent messages can be sent to your provider as well.   To learn more about what you can do with MyChart, go to [email or web address].    Your next appointment:   1 month(s)  Provider:   Sherie Don, NP

## 2023-12-07 ENCOUNTER — Ambulatory Visit: Payer: Medicare HMO

## 2023-12-07 ENCOUNTER — Other Ambulatory Visit: Payer: Self-pay

## 2023-12-21 ENCOUNTER — Telehealth: Payer: Self-pay | Admitting: *Deleted

## 2023-12-21 NOTE — Telephone Encounter (Signed)
 Called and spoke to [employer]. They will contact patient to set up appt with Dr. Jacqulyn Bath.   Dr. Cathie Hoops, does he need follow up appt with you?

## 2023-12-21 NOTE — Telephone Encounter (Signed)
 I see that Victor Phillips called and said to staff with Dr. Jacqulyn Bath that the patient needs to get appt with dr. Jacqulyn Bath at [facility].  Care manager would like to for the pt. To have appt . For the pt and let the patient know.

## 2023-12-22 NOTE — Telephone Encounter (Signed)
 Called pt and he states that he received a call from [facility] this morning, but he is waiting to hear back from his sister so she can transport him to [place].

## 2023-12-23 ENCOUNTER — Encounter: Payer: Self-pay | Admitting: Oncology

## 2023-12-23 NOTE — Telephone Encounter (Signed)
 Per care everywhere, pt is scheduled to see Dr. Jacqulyn Bath on 4/23. Please schedule pt for MD only approx 1-2 weeks after 4/23. Please inform pt off appt details.

## 2023-12-24 ENCOUNTER — Other Ambulatory Visit: Payer: Self-pay

## 2023-12-29 ENCOUNTER — Other Ambulatory Visit: Payer: Self-pay

## 2024-01-04 ENCOUNTER — Ambulatory Visit: Payer: Medicare HMO | Admitting: Cardiology

## 2024-01-06 ENCOUNTER — Other Ambulatory Visit: Payer: Self-pay | Admitting: Oncology

## 2024-01-06 DIAGNOSIS — C9000 Multiple myeloma not having achieved remission: Secondary | ICD-10-CM

## 2024-01-09 ENCOUNTER — Encounter: Payer: Self-pay | Admitting: Oncology

## 2024-01-09 ENCOUNTER — Telehealth: Payer: Self-pay

## 2024-01-09 DIAGNOSIS — C9000 Multiple myeloma not having achieved remission: Secondary | ICD-10-CM

## 2024-01-09 NOTE — Telephone Encounter (Signed)
 Dr. Wilhelmenia Harada would like appt moved up to this week. Please contact pt to see if he can come this week (Thurs of Fri) for lab/MD.

## 2024-01-10 NOTE — Progress Notes (Unsigned)
 Electrophysiology Clinic Note    Date:  01/11/2024  Patient ID:  Adeolu, Pannone Aug 12, 1956, MRN [id number] PCP:  Sari Cunning, MD  Cardiologist:  Constancia Delton, MD Electrophysiologist: Ardeen Kohler, MD   Discussed the use of AI scribe software for clinical note transcription with the patient, who gave verbal consent to proceed.   Patient Profile    Chief Complaint: AFib follow-up  History of Present Illness: IAN FRANZONI is a 68 y.o. male with PMH notable for persis AFib, HTN, HLD, T2DM, multiple myeloma; seen today for Ardeen Kohler, MD for routine electrophysiology followup.   He is s/p DCCV 10/17/2023 with conversion to SR.   He last saw Dr. Daneil Dunker 11/08/2023 for initial consult of AFib. He was in AFib at that appt, unclear when he converted. He is receiving chemo for his MM with ongoing DOE. He was concerned about affordability of eliquis . Dr. Daneil Dunker did not recommend invasive EP procedures. Can consider tikosyn in future. He had met with CPP, and planned to transfer to warfarin.   I saw him 3/25 where he had not transitioned to warfarin as prescribed. Re-sent test claim for eliquis  which was free, and patient was able to restart.   On follow-up today, he overall feels pretty well. He has been taking his eliquis  BID without any concerning bleeding. He is ambulating with a walker out of the house, uses a cane at home. He has not had any further falls. He has occasional dyspnea, denies chest pain, chest pressure, palpitations.      AAD History: None     ROS:  Please see the history of present illness. All other systems are reviewed and otherwise negative.    Physical Exam    VS:  BP 134/82 (BP Location: Left Arm, Patient Position: Sitting, Cuff Size: Normal)   Pulse 95   Ht 5\' 7"  (1.702 m)   Wt 158 lb 12.8 oz (72 kg)   SpO2 97%   BMI 24.87 kg/m  BMI: Body mass index is 24.87 kg/m.  Wt Readings from Last 3 Encounters:  01/11/24 158 lb 12.8 oz (72  kg)  12/06/23 168 lb 6.4 oz (76.4 kg)  11/24/23 174 lb 2.6 oz (79 kg)     GEN- The patient is well appearing, alert and oriented x 3 today.   Lungs- Clear to ausculation bilaterally, normal work of breathing.  Heart- Irregularly irregular rate and rhythm, no murmurs, rubs or gallops Extremities- No peripheral edema, warm, dry    Studies Reviewed   Previous EP, cardiology notes.    EKG is ordered. Personal review of EKG from today shows:    EKG Interpretation Date/Time:  Wednesday January 11 2024 09:23:41 EDT Ventricular Rate:  95 PR Interval:    QRS Duration:  86 QT Interval:  366 QTC Calculation: 459 R Axis:   20  Text Interpretation: Atrial fibrillation Confirmed by Lennette Fader [phone]) on 01/11/2024 5:16:59 PM     Cardiac MRI, 07/27/2023 1.  Normal LV size, low normal systolic function.  LVEF 50%  2.  No LV LGE or scar.  3.  Normal ECV, no evidence for infiltrative disease.  4.  No significant valvular abnormalities.  5.  Normal RV systolic function.  6.  No evidence for cardiac amyloidosis.  TTE, 06/14/2023  1. Left ventricular ejection fraction, by estimation, is 50 to 55%. The left ventricle has low normal function. The left ventricle has no regional wall motion abnormalities. There is mild left ventricular  hypertrophy. Left ventricular diastolic function  could not be evaluated.   2. Right ventricular systolic function is normal. The right ventricular size is normal. There is moderately elevated pulmonary artery systolic pressure.   3. Right atrial size was mildly dilated.   4. The mitral valve is normal in structure. Mild mitral valve regurgitation.   5. The aortic valve is tricuspid. Aortic valve regurgitation is not visualized. No aortic stenosis is present.   6. The inferior vena cava is normal in size with <50% respiratory variability, suggesting right atrial pressure of 8 mmHg.   TTE, 02/13/2023  1. Left ventricular ejection fraction, by estimation, is 60 to  65%. The left ventricle has normal function. The left ventricle has no regional wall motion abnormalities. Left ventricular diastolic parameters are consistent with Grade II diastolic dysfunction (pseudonormalization). Elevated left ventricular end-diastolic pressure. The average left ventricular global longitudinal strain is -19.2 %. The global longitudinal strain is normal.   2. Right ventricular systolic function is normal. The right ventricular size is normal. There is normal pulmonary artery systolic pressure.   3. The mitral valve is normal in structure. Trivial mitral valve regurgitation. No evidence of mitral stenosis.   4. The aortic valve is tricuspid. Aortic valve regurgitation is not visualized. No aortic stenosis is present.   5. There is dilatation of the ascending aorta, measuring 38 mm.   6. The inferior vena cava is dilated in size with >50% respiratory variability, suggesting right atrial pressure of 8 mmHg.    Assessment and Plan     #) persis AFib Relatively asymptomatic Good ventricular rate control We discussed AAD options, specifically tikosyn vs amiodarone. Do not favor amiodarone with patient's MM He requested I investigate the cost of tikosyn to ensure it would be affordable should it be effective.  He would also need transportation assistance to be able to initiate tikosyn at [facility] hospital, will reach out to Child psychotherapist for assistance.   #) Hypercoag d/t persis afib CHA2DS2-VASc Score = at least 3 [CHF History: 0, HTN History: 1, Diabetes History: 1, Stroke History: 0, Vascular Disease History: 0, Age Score: 1, Gender Score: 0].  Therefore, the patient's annual risk of stroke is 3.2 %.   ` Stroke ppx - 5mg  eliquis  BID, appropriately dosed No bleeding concerns.       Current medicines are reviewed at length with the patient today.   The patient does not have concerns regarding his medicines.  The following changes were made today:   none  Labs/ tests ordered  today include:  Orders Placed This Encounter  Procedures   EKG 12-Lead     Disposition: Follow up with Dr. Daneil Dunker or EP APP in 3 months    Signed, Taylen Osorto, NP  01/11/24  5:17 PM  Electrophysiology [facility]

## 2024-01-11 ENCOUNTER — Ambulatory Visit: Attending: Cardiology | Admitting: Cardiology

## 2024-01-11 VITALS — BP 134/82 | HR 95 | Ht 67.0 in | Wt 158.8 lb

## 2024-01-11 DIAGNOSIS — D6869 Other thrombophilia: Secondary | ICD-10-CM

## 2024-01-11 DIAGNOSIS — I4819 Other persistent atrial fibrillation: Secondary | ICD-10-CM

## 2024-01-11 MED ORDER — METOPROLOL SUCCINATE ER 50 MG PO TB24: 75.0000 mg | ORAL_TABLET | Freq: Every day | ORAL | 3 refills | Status: DC

## 2024-01-11 NOTE — Patient Instructions (Signed)
 Medication Instructions:  Your Physician recommend you continue on your current medication as directed.    *If you need a refill on your cardiac medications before your next appointment, please call your pharmacy*  Follow-Up: At [facility], you and your health needs are our priority.  As part of our continuing mission to provide you with exceptional heart care, our providers are all part of one team.  This team includes your primary Cardiologist (physician) and Advanced Practice Providers or APPs (Physician Assistants and Nurse Practitioners) who all work together to provide you with the care you need, when you need it.  Your next appointment:   3 month(s)  Provider:   Ardeen Kohler, MD or Suzann Riddle, NP    We recommend signing up for the patient portal called "MyChart".  Sign up information is provided on this After Visit Summary.  MyChart is used to connect with patients for Virtual Visits (Telemedicine).  Patients are able to view lab/test results, encounter notes, upcoming appointments, etc.  Non-urgent messages can be sent to your provider as well.   To learn more about what you can do with MyChart, go to [email or web address].

## 2024-01-12 ENCOUNTER — Inpatient Hospital Stay: Attending: Oncology | Admitting: Oncology

## 2024-01-12 ENCOUNTER — Telehealth (HOSPITAL_COMMUNITY): Payer: Self-pay | Admitting: Pharmacy Technician

## 2024-01-12 ENCOUNTER — Other Ambulatory Visit (HOSPITAL_COMMUNITY): Payer: Self-pay

## 2024-01-12 ENCOUNTER — Encounter: Payer: Self-pay | Admitting: Oncology

## 2024-01-12 ENCOUNTER — Inpatient Hospital Stay

## 2024-01-12 VITALS — BP 120/89 | HR 90 | Temp 96.0°F | Resp 18 | Wt 158.8 lb

## 2024-01-12 DIAGNOSIS — D801 Nonfamilial hypogammaglobulinemia: Secondary | ICD-10-CM | POA: Diagnosis not present

## 2024-01-12 DIAGNOSIS — I4891 Unspecified atrial fibrillation: Secondary | ICD-10-CM | POA: Diagnosis not present

## 2024-01-12 DIAGNOSIS — D6481 Anemia due to antineoplastic chemotherapy: Secondary | ICD-10-CM | POA: Diagnosis not present

## 2024-01-12 DIAGNOSIS — Z7901 Long term (current) use of anticoagulants: Secondary | ICD-10-CM | POA: Diagnosis not present

## 2024-01-12 DIAGNOSIS — N1832 Chronic kidney disease, stage 3b: Secondary | ICD-10-CM | POA: Diagnosis not present

## 2024-01-12 DIAGNOSIS — I5032 Chronic diastolic (congestive) heart failure: Secondary | ICD-10-CM | POA: Insufficient documentation

## 2024-01-12 DIAGNOSIS — Z79899 Other long term (current) drug therapy: Secondary | ICD-10-CM | POA: Insufficient documentation

## 2024-01-12 DIAGNOSIS — Z79624 Long term (current) use of inhibitors of nucleotide synthesis: Secondary | ICD-10-CM | POA: Insufficient documentation

## 2024-01-12 DIAGNOSIS — Z5112 Encounter for antineoplastic immunotherapy: Secondary | ICD-10-CM | POA: Diagnosis present

## 2024-01-12 DIAGNOSIS — R5383 Other fatigue: Secondary | ICD-10-CM | POA: Diagnosis not present

## 2024-01-12 DIAGNOSIS — E875 Hyperkalemia: Secondary | ICD-10-CM | POA: Diagnosis not present

## 2024-01-12 DIAGNOSIS — R600 Localized edema: Secondary | ICD-10-CM | POA: Insufficient documentation

## 2024-01-12 DIAGNOSIS — R197 Diarrhea, unspecified: Secondary | ICD-10-CM | POA: Insufficient documentation

## 2024-01-12 DIAGNOSIS — T451X5A Adverse effect of antineoplastic and immunosuppressive drugs, initial encounter: Secondary | ICD-10-CM | POA: Insufficient documentation

## 2024-01-12 DIAGNOSIS — N183 Chronic kidney disease, stage 3 unspecified: Secondary | ICD-10-CM | POA: Diagnosis not present

## 2024-01-12 DIAGNOSIS — C9000 Multiple myeloma not having achieved remission: Secondary | ICD-10-CM | POA: Insufficient documentation

## 2024-01-12 LAB — CBC WITH DIFFERENTIAL (CANCER CENTER ONLY)
Abs Immature Granulocytes: 0.02 10*3/uL (ref 0.00–0.07)
Basophils Absolute: 0 10*3/uL (ref 0.0–0.1)
Basophils Relative: 0 %
Eosinophils Absolute: 0.1 10*3/uL (ref 0.0–0.5)
Eosinophils Relative: 2 %
HCT: 32 % — ABNORMAL LOW (ref 39.0–52.0)
Hemoglobin: 10.6 g/dL — ABNORMAL LOW (ref 13.0–17.0)
Immature Granulocytes: 0 %
Lymphocytes Relative: 16 %
Lymphs Abs: 0.8 10*3/uL (ref 0.7–4.0)
MCH: 34.2 pg — ABNORMAL HIGH (ref 26.0–34.0)
MCHC: 33.1 g/dL (ref 30.0–36.0)
MCV: 103.2 fL — ABNORMAL HIGH (ref 80.0–100.0)
Monocytes Absolute: 0.4 10*3/uL (ref 0.1–1.0)
Monocytes Relative: 8 %
Neutro Abs: 3.6 10*3/uL (ref 1.7–7.7)
Neutrophils Relative %: 74 %
Platelet Count: 153 10*3/uL (ref 150–400)
RBC: 3.1 MIL/uL — ABNORMAL LOW (ref 4.22–5.81)
RDW: 14.4 % (ref 11.5–15.5)
WBC Count: 4.9 10*3/uL (ref 4.0–10.5)
nRBC: 0 % (ref 0.0–0.2)

## 2024-01-12 LAB — CMP (CANCER CENTER ONLY)
ALT: 81 U/L — ABNORMAL HIGH (ref 0–44)
AST: 46 U/L — ABNORMAL HIGH (ref 15–41)
Albumin: 3.7 g/dL (ref 3.5–5.0)
Alkaline Phosphatase: 80 U/L (ref 38–126)
Anion gap: 9 (ref 5–15)
BUN: 23 mg/dL (ref 8–23)
CO2: 25 mmol/L (ref 22–32)
Calcium: 8.9 mg/dL (ref 8.9–10.3)
Chloride: 103 mmol/L (ref 98–111)
Creatinine: 0.9 mg/dL (ref 0.61–1.24)
GFR, Estimated: >60 mL/min (ref >60)
Glucose, Bld: 128 mg/dL — ABNORMAL HIGH (ref 70–99)
Potassium: 4 mmol/L (ref 3.5–5.1)
Sodium: 137 mmol/L (ref 135–145)
Total Bilirubin: 0.5 mg/dL (ref 0.0–1.2)
Total Protein: 9.1 g/dL — ABNORMAL HIGH (ref 6.5–8.1)

## 2024-01-12 NOTE — Telephone Encounter (Signed)
 Patient Product/process development scientist completed.    The patient is insured through [employer]. Patient has Medicare and is not eligible for a copay card, but may be able to apply for patient assistance or Medicare RX Payment Plan (Patient Must reach out to their plan, if eligible for payment plan), if available.    Ran test claim for dofetilide (Tikosyn) 125, 250 and 500 mcg and the current 30 day co-pay is $0.00.   This test claim was processed through [facility]- copay amounts may vary at other pharmacies due to pharmacy/plan contracts, or as the patient moves through the different stages of their insurance plan.     Morgan Arab, CPHT Pharmacy Technician III Certified Patient Advocate [facility] Patient Advocate Team Direct Number: [phone]  Fax: [phone]

## 2024-01-12 NOTE — Assessment & Plan Note (Addendum)
 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  Recommendation from [facility] was reviewed.  Next line treatment Carfilzomib D1, 8 15 Q28 days and Selinexor  60mg  Dexamethasone  weekly  Rationale and side effects were reviewed with patient and wife. They agree with the plan.  I will check insurance coverage. Anticipate to start in 1-2 weeks.  Continue Acyclovir , At risk of thrombosis, on Eliquis  5mg  BID for A fib Xegeva monthly if calcium  meets criteria Recommend calcium  and vitamin D  supplementation.

## 2024-01-13 ENCOUNTER — Other Ambulatory Visit: Payer: Self-pay

## 2024-01-13 ENCOUNTER — Telehealth: Payer: Self-pay | Admitting: Pharmacy Technician

## 2024-01-13 ENCOUNTER — Encounter: Payer: Self-pay | Admitting: Oncology

## 2024-01-13 ENCOUNTER — Telehealth: Payer: Self-pay | Admitting: Pharmacist

## 2024-01-13 ENCOUNTER — Other Ambulatory Visit (HOSPITAL_COMMUNITY): Payer: Self-pay

## 2024-01-13 ENCOUNTER — Telehealth: Payer: Self-pay | Admitting: Licensed Clinical Social Worker

## 2024-01-13 DIAGNOSIS — Z139 Encounter for screening, unspecified: Secondary | ICD-10-CM

## 2024-01-13 DIAGNOSIS — C9000 Multiple myeloma not having achieved remission: Secondary | ICD-10-CM

## 2024-01-13 MED ORDER — SELINEXOR (80 MG ONCE WEEKLY) 40 MG PO TBPK: 80.0000 mg | ORAL_TABLET | ORAL | Status: DC

## 2024-01-13 MED ORDER — DEXAMETHASONE 4 MG PO TABS: ORAL_TABLET | ORAL | 11 refills | Status: DC

## 2024-01-13 MED ORDER — SELINEXOR (60 MG ONCE WEEKLY) 20 MG PO TBPK: 60.0000 mg | ORAL_TABLET | ORAL | Status: DC

## 2024-01-13 MED ORDER — ONDANSETRON HCL 8 MG PO TABS: 8.0000 mg | ORAL_TABLET | Freq: Three times a day (TID) | ORAL | 1 refills | Status: AC | PRN

## 2024-01-13 MED ORDER — PROCHLORPERAZINE MALEATE 10 MG PO TABS: 10.0000 mg | ORAL_TABLET | Freq: Four times a day (QID) | ORAL | 1 refills | Status: AC | PRN

## 2024-01-13 MED ORDER — ACYCLOVIR 400 MG PO TABS: 400.0000 mg | ORAL_TABLET | Freq: Two times a day (BID) | ORAL | 0 refills | Status: DC

## 2024-01-13 MED ORDER — SELINEXOR (60 MG ONCE WEEKLY) 20 MG PO TBPK: 60.0000 mg | ORAL_TABLET | ORAL | 1 refills | Status: DC

## 2024-01-13 NOTE — Progress Notes (Signed)
 Heart and Vascular Care Navigation  01/13/2024  BARTHOLOMEW LILJEDAHL 1955-09-27 [id number]  Reason for Referral: transportation for Tikosyn at [facility] Patient is participating in a Managed Medicaid Plan: No, [employer] Medicare only  Engaged with patient by telephone for initial visit for Heart and Vascular Care Coordination.                                                                                                   Assessment:                    LCSW reached pt wife Stana Ear, [place] on file, at shared number [phone]. Introduced self, role, reason for call. Confirmed home address, PCP, and coverage. Pt and pt wife reside alone. She shares that they currently aren't behind on any rent or utilities, they make too much for SNAP assistance at this time but are able to access additional food assistance through [employer]. Decline any additional resources at this time but took my name and phone number and encouraged to call as needed. Pt wife normally drives pt to appts but she does not drive on the highway and if pt needs to go to [facility] or [facility] they rely on a sister to take them. Pt wife appreciative of transportation assistance offer and will let me know as needed.                   No additional questions at this time.  HRT/VAS Care Coordination     Patients Home Cardiology Office [facility]   Outpatient Care Team Social Worker   Social Worker Name: Nathen Balder, [place], [phone]   Living arrangements for the past 2 months Single Family Home   Lives with: Spouse   Patient Current Insurance Coverage Managed Medicare   Patient Has Concern With Paying Medical Bills No   Does Patient Have Prescription Coverage? Yes   Home Assistive Devices/Equipment None   DME Agency [employer]       Social History:                                                                             SDOH Screenings   Food Insecurity: Food Insecurity Present (01/13/2024)  Housing: High Risk (01/13/2024)   Transportation Needs: No Transportation Needs (01/13/2024)  Utilities: Not At Risk (01/13/2024)  Depression (PHQ2-9): Low Risk  (03/14/2023)  Financial Resource Strain: Medium Risk (01/13/2024)  Physical Activity: Insufficiently Active (10/12/2023)   Received from [facility]  Social Connections: Moderately Integrated (10/12/2023)   Received from [facility]  Stress: No Stress Concern Present (10/12/2023)   Received from [facility]  Recent Concern: Stress - Stress Concern Present (10/04/2023)   Received from [facility]  Tobacco Use: Low Risk  (01/12/2024)  Recent Concern: Tobacco Use - High Risk (01/04/2024)   Received from [facility]  Health Literacy: Adequate Health Literacy (01/13/2024)    SDOH Interventions: Financial Resources:  Financial Strain Interventions: Other (Comment) (pt wife expresses general financial strain but declines any additional resources needed at this time)  Food Insecurity:  Food Insecurity Interventions: Other (Comment) (pt uses community resources for pharmacy, encouraged to call me if any additional resources needed- not eligible for [employer])  Housing Insecurity:  Housing Interventions: Intervention Not Indicated, Patient Declined (state they are on top of bills at this time)  Transportation:   Transportation Interventions: Patient Resources Dietitian)    Other Care Navigation Interventions:     Provided Pharmacy assistance resources  Pt wife denies any issues with medication affordability at this time   Follow-up plan:   LCSW provided pt wife my number, remain available to assist with any community resources they may be interested in assistance for- encouraged her to call me when pt scheduled (if scheduled) for Tikosyn at [employer].

## 2024-01-13 NOTE — Telephone Encounter (Signed)
 Oral Oncology Patient Advocate Encounter  Prior Authorization for Xpovio has been approved.    PA# [id number] Effective dates: 09/14/2023 through 09/12/2024  Patients co-pay is $0.   This is a limited distribution drug.   [name] Benjaman Branch, CPhT Oncology Pharmacy Patient Advocate [facility] Direct Number: [phone] Fax: [phone]

## 2024-01-13 NOTE — Telephone Encounter (Signed)
 Clinical Pharmacist Practitioner Encounter   Received new prescription for Xpovio (selinexor) for the treatment of progressive multiple myeloma in conjunction with carfilzomib and dexamethasone , planned duration until disease progression or unacceptable drug toxicity.  CMP from 01/12/24 assessed, no relevant lab abnormalities. Prescription dose and frequency assessed.   Current medication list in Epic reviewed, no DDIs with selinexor identified:  Evaluated chart and no patient barriers to medication adherence identified.   Prescription has been e-scribed to the [facility] for benefits analysis and approval.  Oral Oncology Clinic will continue to follow for insurance authorization, copayment issues, initial counseling and start date.   Victor Phillips, PharmD, BCOP, CPP Hematology/Oncology Clinical Pharmacist [facility] Oral Chemotherapy Navigation Clinic [phone]  01/13/2024 9:54 AM

## 2024-01-13 NOTE — Progress Notes (Signed)
 DISCONTINUE ON PATHWAY REGIMEN - Multiple Myeloma and Other Plasma Cell Dyscrasias   DaraVRd (Daratumumab  SUBQ + Bortezomib  SUBQ D1,4,8,11 + Lenalidomide  PO D1-14 + Dexamethasone  20 mg IV/PO D1,2,8,9,15,16) q21 Days (Induction Schema):   A cycle is every 21 days:     Lenalidomide       Dexamethasone       Bortezomib       Daratumumab  and hyaluronidase -fihj    DaraVRd (Daratumumab  SUBQ + Bortezomib  SUBQ D1,4,8,11 + Lenalidomide  PO D1-14 + Dexamethasone  20 mg IV/PO D1,2,8,9,15,16) q21 Days (Consolidation Schema):   A cycle is every 21 days:     Lenalidomide       Dexamethasone       Bortezomib       Daratumumab  and hyaluronidase -fihj   **Always confirm dose/schedule in your pharmacy ordering system**  PRIOR TREATMENT: [id number]: DaraVRd - Subcutaneous Daratumumab  (Daratumumab /hyaluronidase  SUBQ + Bortezomib  1.3 mg/m2 SUBQ D1, 4, 8, 11 + Lenalidomide  25 mg PO + Dexamethasone  20 mg PO/IV) q21 Days Concurrent with Referral to Transplant Service  START ON PATHWAY REGIMEN - Multiple Myeloma and Other Plasma Cell Dyscrasias     Cycle 1: A cycle is 28 days:     Selinexor      Carfilzomib      Carfilzomib      Dexamethasone     Cycles 2 and beyond: A cycle is every 28 days:     Selinexor      Carfilzomib      Dexamethasone    **Always confirm dose/schedule in your pharmacy ordering system**  Patient Characteristics: Multiple Myeloma, Relapsed / Refractory, Second through Fourth Lines of Therapy, Not a Candidate for CAR T-cell Therapy, Fit or Candidate for Triplet Therapy, Lenalidomide -Refractory or Lenalidomide -based Regimen Not Preferred, Not a Candidate for  Anti-CD38 Antibody Disease Classification: Multiple Myeloma Therapeutic Status: Refractory R2-ISS Staging: II Line of Therapy: Second Line Anti-CD38 Antibody Candidacy: Not a Candidate for Anti-CD38 Antibody Lenalidomide -based Regimen Preference/Candidacy: Lenalidomide -Refractory Intent of Therapy: Non-Curative / Palliative Intent,  Discussed with Patient

## 2024-01-13 NOTE — Telephone Encounter (Signed)
 Oral Oncology Patient Advocate Encounter   Received notification that prior authorization for Xpovio is required.   PA submitted on 01/13/2024 Key [id number] Status is pending     [name] Benjaman Branch, CPhT Oncology Pharmacy Patient Advocate [facility] Direct Number: [phone] Fax: [phone]

## 2024-01-16 ENCOUNTER — Telehealth: Payer: Self-pay | Admitting: *Deleted

## 2024-01-16 LAB — MULTIPLE MYELOMA PANEL, SERUM
Albumin SerPl Elph-Mcnc: 3.6 g/dL (ref 2.9–4.4)
Albumin/Glob SerPl: 0.8 (ref 0.7–1.7)
Alpha 1: 0.2 g/dL (ref 0.0–0.4)
Alpha2 Glob SerPl Elph-Mcnc: 0.8 g/dL (ref 0.4–1.0)
B-Globulin SerPl Elph-Mcnc: 3.5 g/dL — ABNORMAL HIGH (ref 0.7–1.3)
Gamma Glob SerPl Elph-Mcnc: 0.4 g/dL (ref 0.4–1.8)
Globulin, Total: 4.9 g/dL — ABNORMAL HIGH (ref 2.2–3.9)
IgA: 3374 mg/dL — ABNORMAL HIGH (ref 61–437)
IgG (Immunoglobin G), Serum: 390 mg/dL — ABNORMAL LOW (ref 603–1613)
IgM (Immunoglobulin M), Srm: <5 mg/dL — ABNORMAL LOW (ref 20–172)
M Protein SerPl Elph-Mcnc: 2.6 g/dL — ABNORMAL HIGH
Total Protein ELP: 8.5 g/dL (ref 6.0–8.5)

## 2024-01-16 LAB — KAPPA/LAMBDA LIGHT CHAINS
Kappa free light chain: 3.7 mg/L (ref 3.3–19.4)
Kappa, lambda light chain ratio: 0.03 — ABNORMAL LOW (ref 0.26–1.65)
Lambda free light chains: 126.1 mg/L — ABNORMAL HIGH (ref 5.7–26.3)

## 2024-01-16 NOTE — Telephone Encounter (Signed)
 Office visit notes faxed.

## 2024-01-16 NOTE — Telephone Encounter (Signed)
 The pharmacy [phone] called and asked if they can they convert the 20 mg of the extoizo and send a another RX for 15 mg and the patient will take 4  tablets each week. They also want the MD last visit note and send it to fax :[phone], if you have questions then you can call [phone].

## 2024-01-18 ENCOUNTER — Ambulatory Visit: Admitting: Oncology

## 2024-01-19 ENCOUNTER — Telehealth: Payer: Self-pay | Admitting: Cardiology

## 2024-01-19 ENCOUNTER — Telehealth: Payer: Self-pay

## 2024-01-19 ENCOUNTER — Other Ambulatory Visit: Payer: Self-pay | Admitting: Oncology

## 2024-01-19 DIAGNOSIS — C9000 Multiple myeloma not having achieved remission: Secondary | ICD-10-CM

## 2024-01-19 NOTE — Telephone Encounter (Signed)
 Nellie Banas is calling from [facility]. Nellie Banas stated that Dr. [name] is requesting for NP Adaline Holly to call her at [phone].

## 2024-01-19 NOTE — Progress Notes (Addendum)
 Pharmacist Chemotherapy Monitoring - Initial Assessment    Anticipated start date: 01/25/24   The following has been reviewed per standard work regarding the patient's treatment regimen: The patient's diagnosis, treatment plan and drug doses, and organ/hematologic function Lab orders and baseline tests specific to treatment regimen  The treatment plan start date, drug sequencing, and pre-medications Prior authorization status  Patient's documented medication list, including drug-drug interaction screen and prescriptions for anti-emetics and supportive care specific to the treatment regimen The drug concentrations, fluid compatibility, administration routes, and timing of the medications to be used The patient's access for treatment and lifetime cumulative dose history, if applicable  The patient's medication allergies and previous infusion related reactions, if applicable   Changes made to treatment plan:  N/A  Follow up needed:  N/A  Glendora Landsman, PharmD, BCPS Clinical Pharmacist     Glendora Landsman, [facility], 01/19/2024  1:11 PM

## 2024-01-19 NOTE — Telephone Encounter (Signed)
 Dr. Wilhelmenia Harada has updated IS for next week. Please schedule and I will contact pt with appt details.

## 2024-01-19 NOTE — Telephone Encounter (Signed)
 Per [facility] Xpovio to be delivered today 01/19/24. Called and spoke with the [facility], they know not to start the Xpovio until after there next follow-up appt with Dr. Wilhelmenia Harada.

## 2024-01-19 NOTE — Telephone Encounter (Signed)
 Spoke to pt and informed him not to take his medication until he see's Dr. Wilhelmenia Harada. Pt verbalized understanding.   Ph number updated in system, per pt request

## 2024-01-19 NOTE — Telephone Encounter (Signed)
 Per [facility] medication is scheduled to be delivered today 01/19/24. MD Team alerted.

## 2024-01-19 NOTE — Telephone Encounter (Signed)
-----   Message from Alyson N Leonard sent at 01/19/2024  9:30 AM EDT ----- Kandee Orion from the outside pharmacy this morning that his selinexor would be delivered today 01/19/24. So he can now be scheduled per Dr. Wilhelmenia Harada' instructions below.   -Alyson ----- Message ----- From: [name], MD Sent: 01/13/2024   9:43 AM EDT To: Craven Do, RN; Ronell Coe, CMA; #  Mikle Alexanders,  I plan to start him on  Carfilzomib selinexor Dexamethasone  weekly for refractory MM.  Would you please check Selinexor coverage. I plan to start with 60mg  weekly, if he tolerates I may go up on the dose. He will see me first on D1 treatment and then start.   Yu Team,  Once we have estimated date of supply arrival, please schedule lab MD cycle 1 D1  Carfilzomib selinexor Dex [Kxd regimen] let me know the date and I will sign his IS.   Thank you all.   Allyne Areola

## 2024-01-20 ENCOUNTER — Ambulatory Visit: Attending: Cardiology | Admitting: Cardiology

## 2024-01-20 ENCOUNTER — Encounter: Payer: Self-pay | Admitting: Oncology

## 2024-01-20 ENCOUNTER — Other Ambulatory Visit: Payer: Self-pay

## 2024-01-20 VITALS — BP 133/84 | HR 86 | Ht 67.0 in | Wt 160.2 lb

## 2024-01-20 DIAGNOSIS — I4819 Other persistent atrial fibrillation: Secondary | ICD-10-CM | POA: Diagnosis not present

## 2024-01-20 DIAGNOSIS — I10 Essential (primary) hypertension: Secondary | ICD-10-CM

## 2024-01-20 DIAGNOSIS — C9000 Multiple myeloma not having achieved remission: Secondary | ICD-10-CM | POA: Diagnosis not present

## 2024-01-20 NOTE — Telephone Encounter (Signed)
 I spoke to Dr. Wilhelmenia Harada. Advised that given patient is asymptomatic of his afib with normal LVEF, would rate mgmt at this time

## 2024-01-20 NOTE — Patient Instructions (Signed)

## 2024-01-20 NOTE — Progress Notes (Signed)
 Cardiology Office Note:    Date:  01/20/2024   ID:  Victor Phillips, DOB 02/02/1956, MRN [id number]  PCP:  Sari Cunning, MD   [facility] Providers Cardiologist:  Constancia Delton, MD Electrophysiologist:  Ardeen Kohler, MD     Referring MD: Sari Cunning, MD   Chief Complaint  Patient presents with   Follow-up    3 month follow up visit. Patient is doing well on today. Meds reviewed.     History of Present Illness:    Victor Phillips is a 68 y.o. male with a hx of persistent atrial fibrillation, DCCV 2/25, hypertension, hyperlipidemia, diabetes, multiple myeloma who presents for follow-up.  Underwent DC cardioversion 10/2023 successfully.  Follow-up visit showed he had return to A-fib.  He denies palpitations, dizziness or fatigue.  Tolerating Eliquis  with no bleeding issues.  Being treated for multiple myeloma by oncology, plans to start a new chemo medication soon.  Denies any concerns at this time.  Tolerating Toprol -XL, BP well-controlled.  Prior notes/testing. Cardiac MRI 07/2023 EF 50%, no LGE or LV infiltration Echocardiogram 06/2023 EF 50 to 55%   Past Medical History:  Diagnosis Date   Atrial fibrillation (HCC)    Diabetes mellitus without complication (HCC)    Hypercholesteremia    Hypertension    Multiple myeloma not having achieved remission [facility])     Past Surgical History:  Procedure Laterality Date   CARDIOVERSION N/A 10/17/2023   Procedure: CARDIOVERSION;  Surgeon: Constancia Delton, MD;  Location: [facility] ORS;  Service: Cardiovascular;  Laterality: N/A;   COLONOSCOPY N/A 02/13/2023   Procedure: COLONOSCOPY;  Surgeon: Toledo, Alphonsus Jeans, MD;  Location: [facility] ENDOSCOPY;  Service: Gastroenterology;  Laterality: N/A;   COLONOSCOPY N/A 02/14/2023   Procedure: COLONOSCOPY;  Surgeon: Toledo, Alphonsus Jeans, MD;  Location: [facility] ENDOSCOPY;  Service: Gastroenterology;  Laterality: N/A;   ESOPHAGOGASTRODUODENOSCOPY N/A 02/13/2023   Procedure:  ESOPHAGOGASTRODUODENOSCOPY (EGD);  Surgeon: Toledo, Alphonsus Jeans, MD;  Location: [facility] ENDOSCOPY;  Service: Gastroenterology;  Laterality: N/A;   IR BONE MARROW BIOPSY & ASPIRATION  11/24/2023    Current Medications: Current Meds  Medication Sig   acetaminophen  (TYLENOL ) 325 MG tablet Take 2 tablets (650 mg total) by mouth every 6 (six) hours as needed for mild pain (or Fever >/= 101).   acyclovir  (ZOVIRAX ) 400 MG tablet Take 1 tablet (400 mg total) by mouth 2 (two) times daily.   apixaban  (ELIQUIS ) 5 MG TABS tablet Take 1 tablet (5 mg total) by mouth 2 (two) times daily.   atorvastatin  (LIPITOR) 10 MG tablet Take 10 mg by mouth daily.   calcium  carbonate (OS-CAL) 600 MG TABS tablet Take 2 tablets (1,200 mg total) by mouth daily.   cholecalciferol (VITAMIN D3) 25 MCG (1000 UNIT) tablet Take 1 tablet (1,000 Units total) by mouth daily.   dexamethasone  (DECADRON ) 4 MG tablet Take 5 tablets (20 mg) for 1 day on the week after chemotherapy (day 22).   furosemide  (LASIX ) 20 MG tablet Take 1 tablet (20 mg total) by mouth daily as needed for edema or fluid.   glipiZIDE (GLUCOTROL XL) 2.5 MG 24 hr tablet Take 5 mg by mouth daily with breakfast.   loperamide  (IMODIUM ) 2 MG capsule Take 1 capsule (2 mg total) by mouth See admin instructions. With onset of diarrhea, take 4 mg,then 2 mg every 2 hours or after every loose bowel movements  maximum: 16 mg/day   metoprolol  succinate (TOPROL -XL) 50 MG 24 hr tablet Take 1.5 tablets (75 mg total) by  mouth daily. Take with or immediately following a meal.   Multiple Vitamin (MULTIVITAMIN WITH MINERALS) TABS tablet Take 1 tablet by mouth daily.   ondansetron  (ZOFRAN ) 8 MG tablet Take 1 tablet (8 mg total) by mouth every 8 (eight) hours as needed for nausea or vomiting.   pantoprazole  (PROTONIX ) 40 MG tablet Take 1 tablet (40 mg total) by mouth daily.   prochlorperazine  (COMPAZINE ) 10 MG tablet Take 1 tablet (10 mg total) by mouth every 6 (six) hours as needed for nausea  or vomiting.   selinexor  (XPOVIO ) Therapy Pack (60 mg once weekly) Take 3 tablets (60 mg total) by mouth once a week.     Allergies:   Patient has no known allergies.   Social History   Socioeconomic History   Marital status: Married    Spouse name: Not on file   Number of children: Not on file   Years of education: Not on file   Highest education level: Not on file  Occupational History   Not on file  Tobacco Use   Smoking status: Never   Smokeless tobacco: Never  Vaping Use   Vaping status: Never Used  Substance and Sexual Activity   Alcohol use: Not Currently    Comment: quit approx 2001   Drug use: No   Sexual activity: Not on file  Other Topics Concern   Not on file  Social History Narrative   Not on file   Social Drivers of Health   Financial Resource Strain: Medium Risk (01/13/2024)   Overall Financial Resource Strain (CARDIA)    Difficulty of Paying Living Expenses: Somewhat hard  Food Insecurity: Food Insecurity Present (01/13/2024)   Hunger Vital Sign    Worried About Running Out of Food in the Last Year: Sometimes true    Ran Out of Food in the Last Year: Sometimes true  Transportation Needs: No Transportation Needs (01/13/2024)   PRAPARE - Administrator, Civil Service (Medical): No    Lack of Transportation (Non-Medical): No  Physical Activity: Insufficiently Active (10/12/2023)   Received from [facility]   Exercise Vital Sign    Days of Exercise per Week: 7 days    Minutes of Exercise per Session: 10 min  Stress: No Stress Concern Present (10/12/2023)   Received from [facility]ational [facility] - Occupational Stress Questionnaire    Feeling of Stress : Not at all  Recent Concern: Stress - Stress Concern Present (10/04/2023)   Received from [facility] of Occupational Health - Occupational Stress Questionnaire    Feeling of Stress : Rather much   Social Connections: Moderately Integrated (10/12/2023)   Received from [facility]   Social Connection and Isolation Panel [NHANES]    Frequency of Communication with Friends and Family: More than three times a week    Frequency of Social Gatherings with Friends and Family: More than three times a week    Attends Religious Services: 1 to 4 times per year    Active Member of [employer] or Organizations: No    Attends Banker Meetings: Never    Marital Status: Married     Family History: The patient's family history includes Diabetes in his mother; Heart attack in his father.  ROS:   Please see the history of present illness.     All other systems reviewed and are negative.  EKGs/Labs/Other Studies Reviewed:  The following studies were reviewed today:  EKG Interpretation Date/Time:  Friday Jan 20 2024 09:38:14 EDT Ventricular Rate:  86 PR Interval:    QRS Duration:  86 QT Interval:  374 QTC Calculation: 447 R Axis:   15  Text Interpretation: Atrial fibrillation Confirmed by Constancia Delton ([id number]) on 01/20/2024 9:43:47 AM    Recent Labs: 02/11/2023: B Natriuretic Peptide 267.2 07/15/2023: Magnesium  3.1 01/12/2024: ALT 81; BUN 23; Creatinine 0.90; Hemoglobin 10.6; Platelet Count 153; Potassium 4.0; Sodium 137  Recent Lipid Panel    Component Value Date/Time   CHOL 61 02/13/2023 0329   TRIG 74 02/13/2023 0329   HDL 24 (L) 02/13/2023 0329   CHOLHDL 2.5 02/13/2023 0329   VLDL 15 02/13/2023 0329   LDLCALC 22 02/13/2023 0329     Risk Assessment/Calculations:         Physical Exam:    VS:  BP 133/84   Pulse 86   Ht 5\' 7"  (1.702 m)   Wt 160 lb 3.2 oz (72.7 kg)   SpO2 99%   BMI 25.09 kg/m     Wt Readings from Last 3 Encounters:  01/20/24 160 lb 3.2 oz (72.7 kg)  01/12/24 158 lb 12.8 oz (72 kg)  01/11/24 158 lb 12.8 oz (72 kg)     GEN:  Well nourished, well developed in no acute distress HEENT: Normal NECK: No JVD; No carotid  bruits CARDIAC: Irregular irregular RESPIRATORY:  Clear to auscultation without rales, wheezing or rhonchi  ABDOMEN: Soft, non-tender, non-distended MUSCULOSKELETAL:  No edema; No deformity  SKIN: Warm and dry NEUROLOGIC:  Alert and oriented x 3 PSYCHIATRIC:  Normal affect   ASSESSMENT:    1. Persistent atrial fibrillation (HCC)   2. Primary hypertension   3. Multiple myeloma, remission status unspecified (HCC)    PLAN:    In order of problems listed above:  Persistent A-fib, DCCV 10/2023 initially successful, EKG today showing A-fib, clinically asymptomatic.  CMR 07/2023 EF 50%, no LGE, no evidence for infiltrative disease.  Continue Toprol -XL 75 mg daily,  Eliquis  5 mg twice daily. Tikosyn being considered by EP. Hypertension, BP controlled.  Continue Toprol -XL 75 mg daily. Multiple myeloma, management as per oncology.  Follow-up in 6-12 months.         Medication Adjustments/Labs and Tests Ordered: Current medicines are reviewed at length with the patient today.  Concerns regarding medicines are outlined above.  Orders Placed This Encounter  Procedures   EKG 12-Lead   No orders of the defined types were placed in this encounter.   Patient Instructions  Medication Instructions:  Your Physician recommend you continue on your current medication as directed.    *If you need a refill on your cardiac medications before your next appointment, please call your pharmacy*  Lab Work: No labs ordered today  If you have labs (blood work) drawn today and your tests are completely normal, you will receive your results only by: MyChart Message (if you have MyChart) OR A paper copy in the mail If you have any lab test that is abnormal or we need to change your treatment, we will call you to review the results.  Testing/Procedures: No test ordered today   Follow-Up: At [facility], you and your health needs are our priority.  As part of our continuing mission to provide  you with exceptional heart care, our providers are all part of one team.  This team includes your primary Cardiologist (physician) and Advanced Practice Providers or APPs (Physician Assistants  and Nurse Practitioners) who all work together to provide you with the care you need, when you need it.  Your next appointment:   1 year(s)  Provider:   You may see Constancia Delton, MD or one of the following Advanced Practice Providers on your designated Care Team:   Laneta Pintos, NP Gildardo Labrador, PA-C Varney Gentleman, PA-C Cadence Sand [place], PA-C Ronald Cockayne, NP Morey Ar, NP    We recommend signing up for the patient portal called "MyChart".  Sign up information is provided on this After Visit Summary.  MyChart is used to connect with patients for Virtual Visits (Telemedicine).  Patients are able to view lab/test results, encounter notes, upcoming appointments, etc.  Non-urgent messages can be sent to your provider as well.   To learn more about what you can do with MyChart, go to [email or web address].          Signed, Constancia Delton, MD  01/20/2024 10:28 AM    [facility]

## 2024-01-24 MED FILL — Dexamethasone Sodium Phosphate Inj 100 MG/10ML: INTRAMUSCULAR | Qty: 2 | Status: AC

## 2024-01-24 NOTE — Telephone Encounter (Signed)
 Clinical Pharmacist Practitioner Encounter   Patient Education I spoke with patient for overview of new oral chemotherapy medication: Xpovio  (selinexor ) for the treatment of progressive multiple myeloma in conjunction with carfilzomib and dexamethasone , planned duration until disease progression or unacceptable drug toxicity. Planned start 01/25/24.   Counseled patient on administration, dosing, side effects, monitoring, drug-food interactions, safe handling, storage, and disposal. Patient will take 1 tablets (60 mg total) by mouth once a week.  Patient will take ondansetron  8mg  30-60 minutes prior to his selinexor  weekly due to the high emetic risk of selinexor .   Side effects include but not limited to: nausea, diarrhea, decreased wbc/hgb/plt . Diarrhea: patient knows to use loperamide  as needed and call the office if they are having four or more loose stool per day    Reviewed with patient importance of keeping a medication schedule and plan for any missed doses.  After discussion with patient no patient barriers to medication adherence identified.   Mr. Grim voiced understanding and appreciation. All questions answered. Medication handout given to MD team to give to patient tomorrow.  Provided patient with Oral Chemotherapy Navigation Clinic phone number. Patient knows to call the office with questions or concerns. Oral Chemotherapy Navigation Clinic will continue to follow.  Devina Bezold N. Nilay Mangrum, PharmD, BCOP, CPP Hematology/Oncology Clinical Pharmacist [facility] Oral Chemotherapy Navigation Clinic [phone]  01/24/2024 9:40 AM

## 2024-01-25 ENCOUNTER — Inpatient Hospital Stay

## 2024-01-25 ENCOUNTER — Inpatient Hospital Stay (HOSPITAL_BASED_OUTPATIENT_CLINIC_OR_DEPARTMENT_OTHER): Admitting: Oncology

## 2024-01-25 ENCOUNTER — Encounter: Payer: Self-pay | Admitting: Oncology

## 2024-01-25 VITALS — BP 126/88 | HR 88 | Resp 18

## 2024-01-25 VITALS — BP 120/71 | HR 74 | Temp 97.8°F | Wt 160.8 lb

## 2024-01-25 DIAGNOSIS — N1832 Chronic kidney disease, stage 3b: Secondary | ICD-10-CM | POA: Diagnosis not present

## 2024-01-25 DIAGNOSIS — C9000 Multiple myeloma not having achieved remission: Secondary | ICD-10-CM

## 2024-01-25 DIAGNOSIS — Z5111 Encounter for antineoplastic chemotherapy: Secondary | ICD-10-CM

## 2024-01-25 DIAGNOSIS — D6481 Anemia due to antineoplastic chemotherapy: Secondary | ICD-10-CM | POA: Diagnosis not present

## 2024-01-25 DIAGNOSIS — I4891 Unspecified atrial fibrillation: Secondary | ICD-10-CM

## 2024-01-25 DIAGNOSIS — Z5112 Encounter for antineoplastic immunotherapy: Secondary | ICD-10-CM | POA: Diagnosis not present

## 2024-01-25 DIAGNOSIS — T451X5A Adverse effect of antineoplastic and immunosuppressive drugs, initial encounter: Secondary | ICD-10-CM

## 2024-01-25 LAB — CBC WITH DIFFERENTIAL (CANCER CENTER ONLY)
Abs Immature Granulocytes: 0.04 10*3/uL (ref 0.00–0.07)
Basophils Absolute: 0 10*3/uL (ref 0.0–0.1)
Basophils Relative: 1 %
Eosinophils Absolute: 0.1 10*3/uL (ref 0.0–0.5)
Eosinophils Relative: 1 %
HCT: 30.9 % — ABNORMAL LOW (ref 39.0–52.0)
Hemoglobin: 10.4 g/dL — ABNORMAL LOW (ref 13.0–17.0)
Immature Granulocytes: 1 %
Lymphocytes Relative: 12 %
Lymphs Abs: 0.6 10*3/uL — ABNORMAL LOW (ref 0.7–4.0)
MCH: 34.3 pg — ABNORMAL HIGH (ref 26.0–34.0)
MCHC: 33.7 g/dL (ref 30.0–36.0)
MCV: 102 fL — ABNORMAL HIGH (ref 80.0–100.0)
Monocytes Absolute: 0.5 10*3/uL (ref 0.1–1.0)
Monocytes Relative: 9 %
Neutro Abs: 3.9 10*3/uL (ref 1.7–7.7)
Neutrophils Relative %: 76 %
Platelet Count: 150 10*3/uL (ref 150–400)
RBC: 3.03 MIL/uL — ABNORMAL LOW (ref 4.22–5.81)
RDW: 14.3 % (ref 11.5–15.5)
WBC Count: 5 10*3/uL (ref 4.0–10.5)
nRBC: 0 % (ref 0.0–0.2)

## 2024-01-25 LAB — CMP (CANCER CENTER ONLY)
ALT: 74 U/L — ABNORMAL HIGH (ref 0–44)
AST: 47 U/L — ABNORMAL HIGH (ref 15–41)
Albumin: 3.4 g/dL — ABNORMAL LOW (ref 3.5–5.0)
Alkaline Phosphatase: 95 U/L (ref 38–126)
Anion gap: 9 (ref 5–15)
BUN: 30 mg/dL — ABNORMAL HIGH (ref 8–23)
CO2: 26 mmol/L (ref 22–32)
Calcium: 9.3 mg/dL (ref 8.9–10.3)
Chloride: 101 mmol/L (ref 98–111)
Creatinine: 1 mg/dL (ref 0.61–1.24)
GFR, Estimated: >60 mL/min (ref >60)
Glucose, Bld: 184 mg/dL — ABNORMAL HIGH (ref 70–99)
Potassium: 4 mmol/L (ref 3.5–5.1)
Sodium: 136 mmol/L (ref 135–145)
Total Bilirubin: 0.6 mg/dL (ref 0.0–1.2)
Total Protein: 9 g/dL — ABNORMAL HIGH (ref 6.5–8.1)

## 2024-01-25 MED ORDER — DEXAMETHASONE SODIUM PHOSPHATE 100 MG/10ML IJ SOLN
20.0000 mg | Freq: Once | INTRAMUSCULAR | Status: AC
  Administered 2024-01-25: 20 mg via INTRAVENOUS
  Filled 2024-01-25: qty 20

## 2024-01-25 MED ORDER — SODIUM CHLORIDE 0.9 % IV SOLN
Freq: Once | INTRAVENOUS | Status: AC
  Filled 2024-01-25: qty 250

## 2024-01-25 MED ORDER — DEXTROSE 5 % IV SOLN
20.0000 mg/m2 | Freq: Once | INTRAVENOUS | Status: AC
  Administered 2024-01-25: 40 mg via INTRAVENOUS
  Filled 2024-01-25: qty 15

## 2024-01-25 MED ORDER — SODIUM CHLORIDE 0.9 % IV SOLN
INTRAVENOUS | Status: DC
Start: 2024-01-25 — End: 2024-01-25
  Filled 2024-01-25: qty 250

## 2024-01-25 NOTE — Assessment & Plan Note (Signed)
 Encourage oral hydration and avoid nephrotoxins.

## 2024-01-25 NOTE — Assessment & Plan Note (Signed)
 Echo was done on 06/14/23 LVEF 50% Continue Eliquis  5mg  BID I discussed with cardiology provider regarding potential cardiovascular side effects from Carfizomib. [name] will follow up with cardiology

## 2024-01-25 NOTE — Assessment & Plan Note (Addendum)
 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  Monitor.

## 2024-01-25 NOTE — Assessment & Plan Note (Signed)
 Chemotherapy plan as listed above. - hold treatment for now due to recent Ileus. pending decision of BM transplant

## 2024-01-25 NOTE — Assessment & Plan Note (Signed)
 Echo was done on 06/14/23 LVEF 50% Follow up with cardiology Continue Eliquis  5mg  BID

## 2024-01-25 NOTE — Patient Instructions (Signed)
 [facility] - A DEPT OF [facility]  Discharge Instructions: Thank you for choosing [facility] to provide your oncology and hematology care.  If you have a lab appointment with the Cancer Center, please go directly to the Cancer Center and check in at the registration area.  Wear comfortable clothing and clothing appropriate for easy access to any Portacath or PICC line.   We strive to give you quality time with your provider. You may need to reschedule your appointment if you arrive late (15 or more minutes).  Arriving late affects you and other patients whose appointments are after yours.  Also, if you miss three or more appointments without notifying the office, you may be dismissed from the clinic at the provider's discretion.      For prescription refill requests, have your pharmacy contact our office and allow 72 hours for refills to be completed.    Today you received the following chemotherapy and/or immunotherapy agents Kyprolis      To help prevent nausea and vomiting after your treatment, we encourage you to take your nausea medication as directed.  BELOW ARE SYMPTOMS THAT SHOULD BE REPORTED IMMEDIATELY: *FEVER GREATER THAN 100.4 F (38 C) OR HIGHER *CHILLS OR SWEATING *NAUSEA AND VOMITING THAT IS NOT CONTROLLED WITH YOUR NAUSEA MEDICATION *UNUSUAL SHORTNESS OF BREATH *UNUSUAL BRUISING OR BLEEDING *URINARY PROBLEMS (pain or burning when urinating, or frequent urination) *BOWEL PROBLEMS (unusual diarrhea, constipation, pain near the anus) TENDERNESS IN MOUTH AND THROAT WITH OR WITHOUT PRESENCE OF ULCERS (sore throat, sores in mouth, or a toothache) UNUSUAL RASH, SWELLING OR PAIN  UNUSUAL VAGINAL DISCHARGE OR ITCHING   Items with * indicate a potential emergency and should be followed up as soon as possible or go to the Emergency Department if any problems should occur.  Please show the CHEMOTHERAPY ALERT CARD or IMMUNOTHERAPY  ALERT CARD at check-in to the Emergency Department and triage nurse.  Should you have questions after your visit or need to cancel or reschedule your appointment, please contact [facility] - A DEPT OF [facility]  [phone] and follow the prompts.  Office hours are 8:00 a.m. to 4:30 p.m. Monday - Friday. Please note that voicemails left after 4:00 p.m. may not be returned until the following business day.  We are closed weekends and major holidays. You have access to a nurse at all times for urgent questions. Please call the main number to the clinic [phone] and follow the prompts.  For any non-urgent questions, you may also contact your provider using MyChart. We now offer e-Visits for anyone 36 and older to request care online for non-urgent symptoms. For details visit [email or web address].   Also download the MyChart app! Go to the app store, search "MyChart", open the app, select , and log in with your MyChart username and password.

## 2024-01-25 NOTE — Assessment & Plan Note (Addendum)
 Bone marrow biopsy was reviewed. 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  Recommend Carfilzomib D1, 8 15 Q28 days and Selinexor  60mg  Dexamethasone  weekly  Rationale and side effects were reviewed with patient and wife. They agree with the plan.   Continue Acyclovir , At risk of thrombosis, on Eliquis  5mg  BID for A fib Xegeva monthly if calcium  meets criteria Recommend calcium  and vitamin D  supplementation.

## 2024-01-25 NOTE — Progress Notes (Signed)
 Hematology/Oncology Progress note [phone] [phone]         Patient Care Team: Sari Cunning, MD as PCP - General (Internal Medicine) Constancia Delton, MD as PCP - Cardiology (Cardiology) Ardeen Kohler, MD as PCP - Electrophysiology (Cardiology) [name], MD as Consulting Physician (Oncology)  CHIEF COMPLAINTS/REASON FOR VISIT:  Multiple myeloma   ASSESSMENT & PLAN:   Cancer Staging  Multiple myeloma [facility]) Staging form: Plasma Cell Myeloma and Plasma Cell Disorders, AJCC 8th Edition - Clinical stage from 04/14/2023: RISS Stage II (Beta-2 -microglobulin (mg/L): 8.4, Albumin (g/dL): 2.6, ISS: Stage III, High-risk cytogenetics: Absent, LDH: Normal) - Signed by [name], MD on 04/22/2023   Multiple myeloma (HCC) IgA lamda multiple myeloma,  65% plasma cell involvement, IgA lamda, pretreatment M protein 4.3,   Labs are reviewed and discussed with patient. 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  Recommendation from [facility] was reviewed.  Next line treatment Carfilzomib D1, 8 15 Q28 days and Selinexor  60mg  Dexamethasone  weekly  Rationale and side effects were reviewed with patient and wife. They agree with the plan.  I will check insurance coverage. Anticipate to start in 1-2 weeks.  Continue Acyclovir , At risk of thrombosis, on Eliquis  5mg  BID for A fib Xegeva monthly if calcium  meets criteria Recommend calcium  and vitamin D  supplementation.    Atrial fibrillation (HCC) Echo was done on 06/14/23 LVEF 50% Continue Eliquis  5mg  BID I discussed with cardiology provider regarding potential cardiovascular side effects from Carfizomib. [name] will follow up with cardiology   CKD (chronic kidney disease) stage 3, GFR 30-59 ml/min (HCC) Encourage oral hydration and avoid nephrotoxins.    Orders Placed This Encounter  Procedures   Consent Attestation for Oncology Treatment     The patient is informed of risks, benefits, side-effects of the prescribed oncology treatment. Potential short term and long term side effects and response rates discussed. After a long discussion, the patient made informed decision to proceed.:   Yes   ONCBCN PHYSICIAN COMMUNICATION 6    Carfilzomib - monitor patients for clinical signs or symptoms of cardiac failure or cardiac ischemia. Evaluate promptly if cardiac toxicity is suspected. Withhold carfilzomib for Grade 3 or 4 cardiac adverse reactions until recovery and consider whether to restart carfilzomib at 1 dose level reduction based on a benefit/risk assessment.   ONCBCN PHYSICIAN COMMUNICATION 7    Carfilzomib - In patients >= 25 years of age, the risk of cardiac failure is increased. Patients with [place]  Heart Association Class III and IV heart failure, recent myocardial infarction, conduction abnormalities, angina, or arrhythmias uncontrolled by medications may be at greater risk for cardiac complications. Please complete a comprehensive medical assessment (including blood pressure control and fluid management) prior to starting treatment with carfilzomib and remain under close follow-up.   PHYSICIAN COMMUNICATION ORDER    Dexamethasone  20 mg IV to be given in clinic prior to Kyprolis.   Follow-up per LOS  We spent sufficient time to discuss many aspect of care, questions were answered to patient's satisfaction.    [name], MD, PhD [facility] Hematology Oncology 01/12/2024     HISTORY OF PRESENTING ILLNESS:  Victor Phillips is a  68 y.o.  male with PMH listed below who was referred to me for anemia  02/11/2023 - 02/14/2023 recent hospitalization due to pneumonia, respiratory failure.  He was found to have a hemoglobin decreased at 6.8, status post PRBC transfusion during  admission.  EGD showed gastritis.  Colonoscopy was not remarkable. 02/11/2023 TIBC 221 ferritin 107, iron saturation 16. Patient is currently taking fusion  plus Vitamin B12 level in the 300s. His echo showed grade 2 diastolic CHF.  He denies recent chest pain on exertion, shortness of breath on minimal exertion, pre-syncopal episodes, or palpitations He had not noticed any recent bleeding such as epistaxis, hematuria or hematochezia.  He denies over the counter NSAID ingestion.  Oncology History  Multiple myeloma (HCC)  04/14/2023 Initial Diagnosis   Multiple myeloma   03/13/2020 multiple myeloma panel showed M protein 4.3, IgA lambda Free lamda Level 142,-light chain ratio 0.07 04/05/2023 bone marrow biopsy showed hypercellular bone marrow with plasma cell neoplasm.The bone marrow is hypercellular for age with prominent increase in plasma cells representing 65% of all cells in the aspirate associated with interstitial infiltrates and numerous variably sized aggregates in  the clot and biopsy sections.  The plasma cells display lambda light chain restriction consistent with plasma cell neoplasm.  Normal cytogenetics.  FISH studies pending.    04/14/2023 Cancer Staging   Staging form: Plasma Cell Myeloma and Plasma Cell Disorders, AJCC 8th Edition - Clinical stage from 04/14/2023: RISS Stage II (Beta-2 -microglobulin (mg/L): 8.4, Albumin (g/dL): 2.6, ISS: Stage III, High-risk cytogenetics: Absent, LDH: Normal) - Signed by [name], MD on 04/22/2023 Stage prefix: Initial diagnosis Beta 2 microglobulin range (mg/L): Greater than or equal to 5.5 Albumin range (g/dL): Less than 3.5 Cytogenetics: 1q addition, Other mutation   04/14/2023 Imaging   Skeletal survey 1. No lytic lesions or other intrinsic bony abnormality. 2. Borderline cardiomegaly with an interval decrease in size. 3. Diffuse atheromatous arterial calcifications including bilateral carotid artery calcifications, right greater than left   04/22/2023 - 11/11/2023 Chemotherapy   Patient is on Treatment Plan : MYELOMA NEWLY DIAGNOSED TRANSPLANT CANDIDATE DaraVRd (Daratumumab  SQ) q21d      04/25/2023 Imaging   PET scan showed  1. Two tiny hypermetabolic lucent lesions in the left posteromedial eighth and ninth ribs. No additional evidence of multiple myeloma. 2. Aortic atherosclerosis (ICD10-I70.0). Coronary artery calcification.   07/14/2023 - 07/19/2023 Hospital Admission   Hospitalized due to ileus.    07/25/2023 Bone Marrow Biopsy   A-C. Peripheral blood and bone marrow, (peripheral smear, aspirate smear, touch preparation, clot section, core biopsy):   Persistent plasma cell myeloma, 50-60% lambda restricted plasma cells on core biopsy. Hypercellular bone marrow (70%) with trilineage hematopoiesis. Negative for increased blasts. Negative for significant dysplasia. Negative for amyloid deposition.   01/25/2024 -  Chemotherapy   Patient is on Treatment Plan : MYELOMA RELAPSED/ REFRACTORY Carfilzomib D1,8,15 (20/27) + selinexor   + Dexamethasone  (KPd) q28d       A Fib.on Eliquis  5mg  BID, follows up with cardiology. Denies chest pain, abdominal pain, nausea, vomiting + Fatigue has improved.  + bilateral lower extremity edema, he takes Lasix  20mg  daily PRN and edema has improved.  Good appetite and stable weight.    MEDICAL HISTORY:  Past Medical History:  Diagnosis Date   Atrial fibrillation (HCC)    Diabetes mellitus without complication (HCC)    Hypercholesteremia    Hypertension    Multiple myeloma not having achieved remission (HCC)     SURGICAL HISTORY: Past Surgical History:  Procedure Laterality Date   CARDIOVERSION N/A 10/17/2023   Procedure: CARDIOVERSION;  Surgeon: Constancia Delton, MD;  Location: [facility] ORS;  Service: Cardiovascular;  Laterality: N/A;   COLONOSCOPY N/A 02/13/2023   Procedure: COLONOSCOPY;  Surgeon: Corky Diener, Teodoro K, MD;  Location: [facility] ENDOSCOPY;  Service: Gastroenterology;  Laterality: N/A;   COLONOSCOPY N/A 02/14/2023   Procedure: COLONOSCOPY;  Surgeon: Toledo, Alphonsus Jeans, MD;  Location: [facility] ENDOSCOPY;  Service: Gastroenterology;   Laterality: N/A;   ESOPHAGOGASTRODUODENOSCOPY N/A 02/13/2023   Procedure: ESOPHAGOGASTRODUODENOSCOPY (EGD);  Surgeon: Toledo, Alphonsus Jeans, MD;  Location: [facility] ENDOSCOPY;  Service: Gastroenterology;  Laterality: N/A;   IR BONE MARROW BIOPSY & ASPIRATION  11/24/2023    SOCIAL HISTORY: Social History   Socioeconomic History   Marital status: Married    Spouse name: Not on file   Number of children: Not on file   Years of education: Not on file   Highest education level: Not on file  Occupational History   Not on file  Tobacco Use   Smoking status: Never   Smokeless tobacco: Never  Vaping Use   Vaping status: Never Used  Substance and Sexual Activity   Alcohol use: Not Currently    Comment: quit approx 2001   Drug use: No   Sexual activity: Not on file  Other Topics Concern   Not on file  Social History Narrative   Not on file   Social Drivers of Health   Financial Resource Strain: Medium Risk (01/13/2024)   Overall Financial Resource Strain (CARDIA)    Difficulty of Paying Living Expenses: Somewhat hard  Food Insecurity: Food Insecurity Present (01/13/2024)   Hunger Vital Sign    Worried About Running Out of Food in the Last Year: Sometimes true    Ran Out of Food in the Last Year: Sometimes true  Transportation Needs: No Transportation Needs (01/13/2024)   PRAPARE - Administrator, Civil Service (Medical): No    Lack of Transportation (Non-Medical): No  Physical Activity: Insufficiently Active (10/12/2023)   Received from [facility]   Exercise Vital Sign    Days of Exercise per Week: 7 days    Minutes of Exercise per Session: 10 min  Stress: No Stress Concern Present (10/12/2023)   Received from [facility] - Occupational Stress Questionnaire    Feeling of Stress : Not at all  Recent Concern: Stress - Stress Concern Present (10/04/2023)   Received from [facility] - Occupational Stress Questionnaire    Feeling of Stress : Rather much  Social Connections: Moderately Integrated (10/12/2023)   Received from [facility]   Social Connection and Isolation Panel [NHANES]    Frequency of Communication with Friends and Family: More than three times a week    Frequency of Social Gatherings with Friends and Family: More than three times a week    Attends Religious Services: 1 to 4 times per year    Active Member of [employer] or Organizations: No    Attends Banker Meetings: Never    Marital Status: Married  Catering manager Violence: Not At Risk (07/14/2023)   Humiliation, Afraid, Rape, and Kick questionnaire    Fear of Current or Ex-Partner: No    Emotionally Abused: No    Physically Abused: No    Sexually Abused: No    FAMILY HISTORY: Family History  Problem Relation Age of Onset   Diabetes Mother    Heart attack Father     ALLERGIES:  has no known allergies.  MEDICATIONS:  Current Outpatient Medications  Medication Sig Dispense Refill   acetaminophen  (TYLENOL ) 325 MG tablet Take 2 tablets (650 mg  total) by mouth every 6 (six) hours as needed for mild pain (or Fever >/= 101). 90 tablet 1   apixaban  (ELIQUIS ) 5 MG TABS tablet Take 1 tablet (5 mg total) by mouth 2 (two) times daily. 60 tablet 3   atorvastatin  (LIPITOR) 10 MG tablet Take 10 mg by mouth daily.     calcium  carbonate (OS-CAL) 600 MG TABS tablet Take 2 tablets (1,200 mg total) by mouth daily.     cholecalciferol (VITAMIN D3) 25 MCG (1000 UNIT) tablet Take 1 tablet (1,000 Units total) by mouth daily.     furosemide  (LASIX ) 20 MG tablet Take 1 tablet (20 mg total) by mouth daily as needed for edema or fluid. 90 tablet 0   glipiZIDE (GLUCOTROL XL) 2.5 MG 24 hr tablet Take 5 mg by mouth daily with breakfast.     loperamide  (IMODIUM ) 2 MG capsule Take 1 capsule (2 mg total) by mouth See admin instructions. With onset of  diarrhea, take 4 mg,then 2 mg every 2 hours or after every loose bowel movements  maximum: 16 mg/day 60 capsule 2   metoprolol  succinate (TOPROL -XL) 50 MG 24 hr tablet Take 1.5 tablets (75 mg total) by mouth daily. Take with or immediately following a meal. 135 tablet 3   Multiple Vitamin (MULTIVITAMIN WITH MINERALS) TABS tablet Take 1 tablet by mouth daily. 90 tablet 1   pantoprazole  (PROTONIX ) 40 MG tablet Take 1 tablet (40 mg total) by mouth daily. 30 tablet 1   temazepam (RESTORIL) 15 MG capsule Take 15 mg by mouth at bedtime.     acyclovir  (ZOVIRAX ) 400 MG tablet Take 1 tablet (400 mg total) by mouth 2 (two) times daily. 180 tablet 0   dexamethasone  (DECADRON ) 4 MG tablet Take 5 tablets (20 mg) for 1 day on the week after chemotherapy (day 22). 5 tablet 11   ondansetron  (ZOFRAN ) 8 MG tablet Take 1 tablet (8 mg total) by mouth every 8 (eight) hours as needed for nausea or vomiting. 90 tablet 1   prochlorperazine  (COMPAZINE ) 10 MG tablet Take 1 tablet (10 mg total) by mouth every 6 (six) hours as needed for nausea or vomiting. 90 tablet 1   selinexor  (XPOVIO , 60 MG ONCE WEEKLY,) Therapy Pack (60 mg once weekly) Take 60 mg by mouth once a week.     No current facility-administered medications for this visit.   Facility-Administered Medications Ordered in Other Visits  Medication Dose Route Frequency Provider Last Rate Last Admin   0.9 %  sodium chloride  infusion   Intravenous Continuous [name], MD 10 mL/hr at 01/25/24 0910 New Bag at 01/25/24 0910   0.9 %  sodium chloride  infusion   Intravenous Once Thresia Ramanathan, MD       carfilzomib (KYPROLIS) 40 mg in dextrose  5 % 50 mL chemo infusion  20 mg/m2 (Treatment Plan Recorded) Intravenous Once [name], MD       dexamethasone  (DECADRON ) 20 mg in sodium chloride  0.9 % 50 mL IVPB  20 mg Intravenous Once Shawnta Schlegel, MD 208 mL/hr at 01/25/24 0911 20 mg at 01/25/24 0911    Review of Systems  Constitutional:  Positive for fatigue. Negative for appetite  change, chills, fever and unexpected weight change.  HENT:   Negative for hearing loss and voice change.   Eyes:  Negative for eye problems and icterus.  Respiratory:  Negative for chest tightness, cough and shortness of breath.   Cardiovascular:  Negative for chest pain and leg swelling.  Gastrointestinal:  Negative for  abdominal distention, abdominal pain and diarrhea.  Endocrine: Negative for hot flashes.  Genitourinary:  Negative for difficulty urinating, dysuria and frequency.   Musculoskeletal:  Negative for arthralgias.  Skin:  Negative for itching and rash.  Neurological:  Negative for light-headedness and numbness.  Hematological:  Negative for adenopathy. Does not bruise/bleed easily.  Psychiatric/Behavioral:  Positive for sleep disturbance. Negative for confusion.     PHYSICAL EXAMINATION: Vitals:   01/12/24 0827  BP: 120/89  Pulse: 90  Resp: 18  Temp: (!) 96 F (35.6 C)  SpO2: 100%   Filed Weights   01/12/24 0827  Weight: 158 lb 12.8 oz (72 kg)    Physical Exam Constitutional:      General: He is not in acute distress.    Appearance: He is obese.  HENT:     Head: Normocephalic and atraumatic.  Eyes:     General: No scleral icterus. Cardiovascular:     Rate and Rhythm: Normal rate and regular rhythm.  Pulmonary:     Effort: Pulmonary effort is normal. No respiratory distress.     Breath sounds: No wheezing.  Abdominal:     General: Bowel sounds are normal. There is no distension.     Palpations: Abdomen is soft.  Musculoskeletal:        General: No deformity. Normal range of motion.     Cervical back: Normal range of motion and neck supple.     Right lower leg: Edema present.     Left lower leg: Edema present.  Skin:    General: Skin is warm and dry.     Findings: No erythema or rash.  Neurological:     Mental Status: He is alert and oriented to person, place, and time. Mental status is at baseline.     Cranial Nerves: No cranial nerve deficit.   Psychiatric:        Mood and Affect: Mood normal.      LABORATORY DATA:  I have reviewed the data as listed    Latest Ref Rng & Units 01/25/2024    8:01 AM 01/12/2024    8:00 AM 11/24/2023    7:34 AM  CBC  WBC 4.0 - 10.5 K/uL 5.0  4.9  4.3   Hemoglobin 13.0 - 17.0 g/dL 60.4  54.0  8.9   Hematocrit 39.0 - 52.0 % 30.9  32.0  27.3   Platelets 150 - 400 K/uL 150  153  146       Latest Ref Rng & Units 01/25/2024    8:01 AM 01/12/2024    8:00 AM 11/08/2023   12:52 PM  CMP  Glucose 70 - 99 mg/dL 981  191  478   BUN 8 - 23 mg/dL 30  23  26    Creatinine 0.61 - 1.24 mg/dL 2.95  6.21  3.08   Sodium 135 - 145 mmol/L 136  137  132   Potassium 3.5 - 5.1 mmol/L 4.0  4.0  4.2   Chloride 98 - 111 mmol/L 101  103  102   CO2 22 - 32 mmol/L 26  25  23    Calcium  8.9 - 10.3 mg/dL 9.3  8.9  8.7   Total Protein 6.5 - 8.1 g/dL 9.0  9.1  8.1   Total Bilirubin 0.0 - 1.2 mg/dL 0.6  0.5  0.6   Alkaline Phos 38 - 126 U/L 95  80  80   AST 15 - 41 U/L 47  46  28   ALT 0 -  44 U/L 74  81  36    Lab Results  Component Value Date   IRON 100 03/14/2023   TIBC 272 03/14/2023   IRONPCTSAT 37 03/14/2023   FERRITIN 249 03/14/2023     RADIOGRAPHIC STUDIES: I have personally reviewed the radiological images as listed and agreed with the findings in the report. No results found.

## 2024-01-25 NOTE — Progress Notes (Signed)
 Hematology/Oncology Progress note [phone] [phone]         Patient Care Team: Sari Cunning, MD as PCP - General (Internal Medicine) Constancia Delton, MD as PCP - Cardiology (Cardiology) Ardeen Kohler, MD as PCP - Electrophysiology (Cardiology) [name], MD as Consulting Physician (Oncology)  CHIEF COMPLAINTS/REASON FOR VISIT:  Multiple myeloma   ASSESSMENT & PLAN:   Cancer Staging  Multiple myeloma [facility]) Staging form: Plasma Cell Myeloma and Plasma Cell Disorders, AJCC 8th Edition - Clinical stage from 04/14/2023: RISS Stage II (Beta-2 -microglobulin (mg/L): 8.4, Albumin (g/dL): 2.6, ISS: Stage III, High-risk cytogenetics: Absent, LDH: Normal) - Signed by [name], MD on 04/22/2023   Multiple myeloma (HCC) Bone marrow biopsy was reviewed. 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  Recommend Carfilzomib D1, 8 15 Q28 days and Selinexor  60mg  Dexamethasone  weekly  Rationale and side effects were reviewed with patient and wife. They agree with the plan.   Continue Acyclovir , At risk of thrombosis, on Eliquis  5mg  BID for A fib Xegeva monthly if calcium  meets criteria Recommend calcium  and vitamin D  supplementation.    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  Monitor.   Atrial fibrillation (HCC) Echo was done on 06/14/23 LVEF 50% Follow up with cardiology Continue Eliquis  5mg  BID   CKD (chronic kidney disease) stage 3, GFR 30-59 ml/min (HCC) Encourage oral hydration and avoid nephrotoxins.    Encounter for antineoplastic chemotherapy Chemotherapy plan as listed above. - hold treatment for now due to recent Ileus. pending decision of BM transplant    No orders of the  defined types were placed in this encounter.  Follow-up per LOS  We spent sufficient time to discuss many aspect of care, questions were answered to patient's satisfaction.    [name], MD, PhD [facility] Hematology Oncology 01/25/2024     HISTORY OF PRESENTING ILLNESS:  Victor Phillips is a  68 y.o.  male with PMH listed below who was referred to me for anemia  02/11/2023 - 02/14/2023 recent hospitalization due to pneumonia, respiratory failure.  He was found to have a hemoglobin decreased at 6.8, status post PRBC transfusion during admission.  EGD showed gastritis.  Colonoscopy was not remarkable. 02/11/2023 TIBC 221 ferritin 107, iron saturation 16. Patient is currently taking fusion plus Vitamin B12 level in the 300s. His echo showed grade 2 diastolic CHF.  He denies recent chest pain on exertion, shortness of breath on minimal exertion, pre-syncopal episodes, or palpitations He had not noticed any recent bleeding such as epistaxis, hematuria or hematochezia.  He denies over the counter NSAID ingestion.  Oncology History  Multiple myeloma (HCC)  04/14/2023 Initial Diagnosis   Multiple myeloma   03/13/2020 multiple myeloma panel showed M protein 4.3, IgA lambda Free lamda Level 142,-light chain ratio 0.07 04/05/2023 bone marrow biopsy showed hypercellular bone marrow with plasma cell neoplasm.The bone marrow is hypercellular for age with prominent increase in plasma cells representing 65% of all cells in the aspirate associated with interstitial infiltrates and numerous variably sized aggregates in  the clot and biopsy sections.  The plasma cells display lambda light chain restriction consistent with plasma cell neoplasm.  Normal cytogenetics.  FISH studies pending.    04/14/2023 Cancer Staging   Staging  form: Plasma Cell Myeloma and Plasma Cell Disorders, AJCC 8th Edition - Clinical stage from 04/14/2023: RISS Stage II (Beta-2 -microglobulin (mg/L): 8.4, Albumin (g/dL): 2.6, ISS: Stage  III, High-risk cytogenetics: Absent, LDH: Normal) - Signed by [name], MD on 04/22/2023 Stage prefix: Initial diagnosis Beta 2 microglobulin range (mg/L): Greater than or equal to 5.5 Albumin range (g/dL): Less than 3.5 Cytogenetics: 1q addition, Other mutation   04/14/2023 Imaging   Skeletal survey 1. No lytic lesions or other intrinsic bony abnormality. 2. Borderline cardiomegaly with an interval decrease in size. 3. Diffuse atheromatous arterial calcifications including bilateral carotid artery calcifications, right greater than left   04/22/2023 - 11/11/2023 Chemotherapy   Patient is on Treatment Plan : MYELOMA NEWLY DIAGNOSED TRANSPLANT CANDIDATE DaraVRd (Daratumumab  SQ) q21d     04/25/2023 Imaging   PET scan showed  1. Two tiny hypermetabolic lucent lesions in the left posteromedial eighth and ninth ribs. No additional evidence of multiple myeloma. 2. Aortic atherosclerosis (ICD10-I70.0). Coronary artery calcification.   07/14/2023 - 07/19/2023 Hospital Admission   Hospitalized due to ileus.    07/25/2023 Bone Marrow Biopsy   A-C. Peripheral blood and bone marrow, (peripheral smear, aspirate smear, touch preparation, clot section, core biopsy):   Persistent plasma cell myeloma, 50-60% lambda restricted plasma cells on core biopsy. Hypercellular bone marrow (70%) with trilineage hematopoiesis. Negative for increased blasts. Negative for significant dysplasia. Negative for amyloid deposition.   01/24/2024 -  Chemotherapy   Patient is on Treatment Plan : MYELOMA RELAPSED/ REFRACTORY Carfilzomib D1,8,15 (20/27) + selinexor   + Dexamethasone  (KPd) q28d       A Fib. on Eliquis  5mg  BID, follows up with cardiology. Denies chest pain, abdominal pain, nausea, vomiting His appetite is good.     MEDICAL HISTORY:  Past Medical History:  Diagnosis Date   Atrial fibrillation (HCC)    Diabetes mellitus without complication (HCC)    Hypercholesteremia    Hypertension    Multiple myeloma  not having achieved remission (HCC)     SURGICAL HISTORY: Past Surgical History:  Procedure Laterality Date   CARDIOVERSION N/A 10/17/2023   Procedure: CARDIOVERSION;  Surgeon: Constancia Delton, MD;  Location: [facility] ORS;  Service: Cardiovascular;  Laterality: N/A;   COLONOSCOPY N/A 02/13/2023   Procedure: COLONOSCOPY;  Surgeon: Toledo, Alphonsus Jeans, MD;  Location: [facility] ENDOSCOPY;  Service: Gastroenterology;  Laterality: N/A;   COLONOSCOPY N/A 02/14/2023   Procedure: COLONOSCOPY;  Surgeon: Toledo, Alphonsus Jeans, MD;  Location: [facility] ENDOSCOPY;  Service: Gastroenterology;  Laterality: N/A;   ESOPHAGOGASTRODUODENOSCOPY N/A 02/13/2023   Procedure: ESOPHAGOGASTRODUODENOSCOPY (EGD);  Surgeon: Toledo, Alphonsus Jeans, MD;  Location: [facility] ENDOSCOPY;  Service: Gastroenterology;  Laterality: N/A;   IR BONE MARROW BIOPSY & ASPIRATION  11/24/2023    SOCIAL HISTORY: Social History   Socioeconomic History   Marital status: Married    Spouse name: Not on file   Number of children: Not on file   Years of education: Not on file   Highest education level: Not on file  Occupational History   Not on file  Tobacco Use   Smoking status: Never   Smokeless tobacco: Never  Vaping Use   Vaping status: Never Used  Substance and Sexual Activity   Alcohol use: Not Currently    Comment: quit approx 2001   Drug use: No   Sexual activity: Not on file  Other Topics Concern   Not on file  Social History Narrative   Not on file   Social Drivers of Health   Financial Resource Strain:  Medium Risk (01/13/2024)   Overall Financial Resource Strain (CARDIA)    Difficulty of Paying Living Expenses: Somewhat hard  Food Insecurity: Food Insecurity Present (01/13/2024)   Hunger Vital Sign    Worried About Running Out of Food in the Last Year: Sometimes true    Ran Out of Food in the Last Year: Sometimes true  Transportation Needs: No Transportation Needs (01/13/2024)   PRAPARE - Administrator, Civil Service (Medical): No     Lack of Transportation (Non-Medical): No  Physical Activity: Insufficiently Active (10/12/2023)   Received from [facility]   Exercise Vital Sign    Days of Exercise per Week: 7 days    Minutes of Exercise per Session: 10 min  Stress: No Stress Concern Present (10/12/2023)   Received from [facility] of Occupational Health - Occupational Stress Questionnaire    Feeling of Stress : Not at all  Recent Concern: Stress - Stress Concern Present (10/04/2023)   Received from [facility] of Occupational Health - Occupational Stress Questionnaire    Feeling of Stress : Rather much  Social Connections: Moderately Integrated (10/12/2023)   Received from [facility]   Social Connection and Isolation Panel [NHANES]    Frequency of Communication with Friends and Family: More than three times a week    Frequency of Social Gatherings with Friends and Family: More than three times a week    Attends Religious Services: 1 to 4 times per year    Active Member of [employer] or Organizations: No    Attends Banker Meetings: Never    Marital Status: Married  Catering manager Violence: Not At Risk (07/14/2023)   Humiliation, Afraid, Rape, and Kick questionnaire    Fear of Current or Ex-Partner: No    Emotionally Abused: No    Physically Abused: No    Sexually Abused: No    FAMILY HISTORY: Family History  Problem Relation Age of Onset   Diabetes Mother    Heart attack Father     ALLERGIES:  has no known allergies.  MEDICATIONS:  Current Outpatient Medications  Medication Sig Dispense Refill   acetaminophen  (TYLENOL ) 325 MG tablet Take 2 tablets (650 mg total) by mouth every 6 (six) hours as needed for mild pain (or Fever >/= 101). 90 tablet 1   acyclovir  (ZOVIRAX ) 400 MG tablet Take 1 tablet (400 mg total) by mouth 2 (two) times daily. 180 tablet 0   apixaban  (ELIQUIS ) 5 MG TABS  tablet Take 1 tablet (5 mg total) by mouth 2 (two) times daily. 60 tablet 3   atorvastatin  (LIPITOR) 10 MG tablet Take 10 mg by mouth daily.     calcium  carbonate (OS-CAL) 600 MG TABS tablet Take 2 tablets (1,200 mg total) by mouth daily.     cholecalciferol (VITAMIN D3) 25 MCG (1000 UNIT) tablet Take 1 tablet (1,000 Units total) by mouth daily.     dexamethasone  (DECADRON ) 4 MG tablet Take 5 tablets (20 mg) for 1 day on the week after chemotherapy (day 22). 5 tablet 11   furosemide  (LASIX ) 20 MG tablet Take 1 tablet (20 mg total) by mouth daily as needed for edema or fluid. 90 tablet 0   glipiZIDE (GLUCOTROL XL) 2.5 MG 24 hr tablet Take 5 mg by mouth daily with breakfast.     loperamide  (IMODIUM ) 2 MG capsule Take 1 capsule (2 mg total) by mouth See admin  instructions. With onset of diarrhea, take 4 mg,then 2 mg every 2 hours or after every loose bowel movements  maximum: 16 mg/day 60 capsule 2   metoprolol  succinate (TOPROL -XL) 50 MG 24 hr tablet Take 1.5 tablets (75 mg total) by mouth daily. Take with or immediately following a meal. 135 tablet 3   Multiple Vitamin (MULTIVITAMIN WITH MINERALS) TABS tablet Take 1 tablet by mouth daily. 90 tablet 1   ondansetron  (ZOFRAN ) 8 MG tablet Take 1 tablet (8 mg total) by mouth every 8 (eight) hours as needed for nausea or vomiting. 90 tablet 1   pantoprazole  (PROTONIX ) 40 MG tablet Take 1 tablet (40 mg total) by mouth daily. 30 tablet 1   prochlorperazine  (COMPAZINE ) 10 MG tablet Take 1 tablet (10 mg total) by mouth every 6 (six) hours as needed for nausea or vomiting. 90 tablet 1   selinexor  (XPOVIO , 60 MG ONCE WEEKLY,) Therapy Pack (60 mg once weekly) Take 60 mg by mouth once a week.     temazepam (RESTORIL) 15 MG capsule Take 15 mg by mouth at bedtime.     No current facility-administered medications for this visit.    Review of Systems  Constitutional:  Positive for fatigue. Negative for appetite change, chills, fever and unexpected weight change.   HENT:   Negative for hearing loss and voice change.   Eyes:  Negative for eye problems and icterus.  Respiratory:  Negative for chest tightness, cough and shortness of breath.   Cardiovascular:  Negative for chest pain and leg swelling.  Gastrointestinal:  Negative for abdominal distention, abdominal pain and diarrhea.  Endocrine: Negative for hot flashes.  Genitourinary:  Negative for difficulty urinating, dysuria and frequency.   Musculoskeletal:  Negative for arthralgias.  Skin:  Negative for itching and rash.  Neurological:  Negative for light-headedness and numbness.  Hematological:  Negative for adenopathy. Does not bruise/bleed easily.  Psychiatric/Behavioral:  Positive for sleep disturbance. Negative for confusion.     PHYSICAL EXAMINATION: Vitals:   01/25/24 0822 01/25/24 0826  BP: 120/80 120/71  Pulse: (!) 171 74  Temp: 97.8 F (36.6 C)   SpO2: 96%    Filed Weights   01/25/24 0822  Weight: 160 lb 12.8 oz (72.9 kg)    Physical Exam Constitutional:      General: He is not in acute distress.    Appearance: He is obese.  HENT:     Head: Normocephalic and atraumatic.  Eyes:     General: No scleral icterus. Cardiovascular:     Rate and Rhythm: Normal rate and regular rhythm.  Pulmonary:     Effort: Pulmonary effort is normal. No respiratory distress.     Breath sounds: No wheezing.  Abdominal:     General: Bowel sounds are normal. There is no distension.     Palpations: Abdomen is soft.  Musculoskeletal:        General: No deformity. Normal range of motion.     Cervical back: Normal range of motion and neck supple.     Right lower leg: Edema present.     Left lower leg: Edema present.  Skin:    General: Skin is warm and dry.     Findings: No erythema or rash.  Neurological:     Mental Status: He is alert and oriented to person, place, and time. Mental status is at baseline.     Cranial Nerves: No cranial nerve deficit.  Psychiatric:        Mood and  Affect: Mood  normal.      LABORATORY DATA:  I have reviewed the data as listed    Latest Ref Rng & Units 01/25/2024    8:01 AM 01/12/2024    8:00 AM 11/24/2023    7:34 AM  CBC  WBC 4.0 - 10.5 K/uL 5.0  4.9  4.3   Hemoglobin 13.0 - 17.0 g/dL 21.3  08.6  8.9   Hematocrit 39.0 - 52.0 % 30.9  32.0  27.3   Platelets 150 - 400 K/uL 150  153  146       Latest Ref Rng & Units 01/25/2024    8:01 AM 01/12/2024    8:00 AM 11/08/2023   12:52 PM  CMP  Glucose 70 - 99 mg/dL 578  469  629   BUN 8 - 23 mg/dL 30  23  26    Creatinine 0.61 - 1.24 mg/dL 5.28  4.13  2.44   Sodium 135 - 145 mmol/L 136  137  132   Potassium 3.5 - 5.1 mmol/L 4.0  4.0  4.2   Chloride 98 - 111 mmol/L 101  103  102   CO2 22 - 32 mmol/L 26  25  23    Calcium  8.9 - 10.3 mg/dL 9.3  8.9  8.7   Total Protein 6.5 - 8.1 g/dL 9.0  9.1  8.1   Total Bilirubin 0.0 - 1.2 mg/dL 0.6  0.5  0.6   Alkaline Phos 38 - 126 U/L 95  80  80   AST 15 - 41 U/L 47  46  28   ALT 0 - 44 U/L 74  81  36    Lab Results  Component Value Date   IRON 100 03/14/2023   TIBC 272 03/14/2023   IRONPCTSAT 37 03/14/2023   FERRITIN 249 03/14/2023     RADIOGRAPHIC STUDIES: I have personally reviewed the radiological images as listed and agreed with the findings in the report. No results found.

## 2024-01-25 NOTE — Addendum Note (Signed)
 Addended by: [name] on: 01/25/2024 09:19 AM   Modules accepted: Level of Service

## 2024-01-26 ENCOUNTER — Other Ambulatory Visit: Payer: Self-pay

## 2024-01-30 MED FILL — Dexamethasone Sodium Phosphate Inj 100 MG/10ML: INTRAMUSCULAR | Qty: 2 | Status: AC

## 2024-01-31 ENCOUNTER — Encounter: Payer: Self-pay | Admitting: Oncology

## 2024-01-31 ENCOUNTER — Inpatient Hospital Stay (HOSPITAL_BASED_OUTPATIENT_CLINIC_OR_DEPARTMENT_OTHER): Admitting: Oncology

## 2024-01-31 ENCOUNTER — Inpatient Hospital Stay

## 2024-01-31 VITALS — BP 139/90 | HR 50 | Temp 96.0°F | Resp 17 | Wt 160.0 lb

## 2024-01-31 DIAGNOSIS — N1832 Chronic kidney disease, stage 3b: Secondary | ICD-10-CM | POA: Diagnosis not present

## 2024-01-31 DIAGNOSIS — D6481 Anemia due to antineoplastic chemotherapy: Secondary | ICD-10-CM | POA: Diagnosis not present

## 2024-01-31 DIAGNOSIS — Z5111 Encounter for antineoplastic chemotherapy: Secondary | ICD-10-CM

## 2024-01-31 DIAGNOSIS — I4891 Unspecified atrial fibrillation: Secondary | ICD-10-CM

## 2024-01-31 DIAGNOSIS — C9000 Multiple myeloma not having achieved remission: Secondary | ICD-10-CM

## 2024-01-31 DIAGNOSIS — T451X5A Adverse effect of antineoplastic and immunosuppressive drugs, initial encounter: Secondary | ICD-10-CM

## 2024-01-31 DIAGNOSIS — Z5112 Encounter for antineoplastic immunotherapy: Secondary | ICD-10-CM | POA: Diagnosis not present

## 2024-01-31 LAB — CBC WITH DIFFERENTIAL (CANCER CENTER ONLY)
Abs Immature Granulocytes: 0.02 10*3/uL (ref 0.00–0.07)
Basophils Absolute: 0 10*3/uL (ref 0.0–0.1)
Basophils Relative: 0 %
Eosinophils Absolute: 0.1 10*3/uL (ref 0.0–0.5)
Eosinophils Relative: 1 %
HCT: 31 % — ABNORMAL LOW (ref 39.0–52.0)
Hemoglobin: 10.6 g/dL — ABNORMAL LOW (ref 13.0–17.0)
Immature Granulocytes: 0 %
Lymphocytes Relative: 10 %
Lymphs Abs: 0.7 10*3/uL (ref 0.7–4.0)
MCH: 34.4 pg — ABNORMAL HIGH (ref 26.0–34.0)
MCHC: 34.2 g/dL (ref 30.0–36.0)
MCV: 100.6 fL — ABNORMAL HIGH (ref 80.0–100.0)
Monocytes Absolute: 0.6 10*3/uL (ref 0.1–1.0)
Monocytes Relative: 9 %
Neutro Abs: 5.2 10*3/uL (ref 1.7–7.7)
Neutrophils Relative %: 80 %
Platelet Count: 153 10*3/uL (ref 150–400)
RBC: 3.08 MIL/uL — ABNORMAL LOW (ref 4.22–5.81)
RDW: 14.4 % (ref 11.5–15.5)
WBC Count: 6.6 10*3/uL (ref 4.0–10.5)
nRBC: 0 % (ref 0.0–0.2)

## 2024-01-31 LAB — CMP (CANCER CENTER ONLY)
ALT: 86 U/L — ABNORMAL HIGH (ref 0–44)
AST: 38 U/L (ref 15–41)
Albumin: 3.8 g/dL (ref 3.5–5.0)
Alkaline Phosphatase: 98 U/L (ref 38–126)
Anion gap: 8 (ref 5–15)
BUN: 30 mg/dL — ABNORMAL HIGH (ref 8–23)
CO2: 24 mmol/L (ref 22–32)
Calcium: 8.9 mg/dL (ref 8.9–10.3)
Chloride: 101 mmol/L (ref 98–111)
Creatinine: 1.12 mg/dL (ref 0.61–1.24)
GFR, Estimated: >60 mL/min (ref >60)
Glucose, Bld: 174 mg/dL — ABNORMAL HIGH (ref 70–99)
Potassium: 4.5 mmol/L (ref 3.5–5.1)
Sodium: 133 mmol/L — ABNORMAL LOW (ref 135–145)
Total Bilirubin: 0.6 mg/dL (ref 0.0–1.2)
Total Protein: 8.7 g/dL — ABNORMAL HIGH (ref 6.5–8.1)

## 2024-01-31 MED ORDER — SODIUM CHLORIDE 0.9 % IV SOLN
20.0000 mg | Freq: Once | INTRAVENOUS | Status: AC
  Administered 2024-01-31: 20 mg via INTRAVENOUS
  Filled 2024-01-31: qty 20

## 2024-01-31 MED ORDER — SODIUM CHLORIDE 0.9 % IV SOLN
INTRAVENOUS | Status: DC
  Filled 2024-01-31: qty 250

## 2024-01-31 MED ORDER — DEXTROSE 5 % IV SOLN
27.0000 mg/m2 | Freq: Once | INTRAVENOUS | Status: AC
  Administered 2024-01-31: 50 mg via INTRAVENOUS
  Filled 2024-01-31: qty 15

## 2024-01-31 MED ORDER — SODIUM CHLORIDE 0.9 % IV SOLN
Freq: Once | INTRAVENOUS | Status: AC
  Filled 2024-01-31: qty 250

## 2024-01-31 NOTE — Assessment & Plan Note (Signed)
 Bone marrow biopsy was reviewed. 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  Currently on 2nd line Carfilzomib  D1, 8 15 Q28 days and Selinexor  60mg  Dexamethasone  weekly  Proceed with cycle 1 D8 Carfilzomib , continue weekly Selinexor  60mg  Dexamethasone    Prophylaxis  Continue Acyclovir , At risk of thrombosis, on Eliquis  5mg  BID for A fib Xegeva monthly if calcium  meets criteria Recommend calcium  and vitamin D  supplementation.

## 2024-01-31 NOTE — Patient Instructions (Signed)
 [facility] - A DEPT OF [facility]  Discharge Instructions: Thank you for choosing [facility] to provide your oncology and hematology care.  If you have a lab appointment with the Cancer Center, please go directly to the Cancer Center and check in at the registration area.  Wear comfortable clothing and clothing appropriate for easy access to any Portacath or PICC line.   We strive to give you quality time with your provider. You may need to reschedule your appointment if you arrive late (15 or more minutes).  Arriving late affects you and other patients whose appointments are after yours.  Also, if you miss three or more appointments without notifying the office, you may be dismissed from the clinic at the provider's discretion.      For prescription refill requests, have your pharmacy contact our office and allow 72 hours for refills to be completed.    Today you received the following chemotherapy and/or immunotherapy agents KYPROLIS      To help prevent nausea and vomiting after your treatment, we encourage you to take your nausea medication as directed.  BELOW ARE SYMPTOMS THAT SHOULD BE REPORTED IMMEDIATELY: *FEVER GREATER THAN 100.4 F (38 C) OR HIGHER *CHILLS OR SWEATING *NAUSEA AND VOMITING THAT IS NOT CONTROLLED WITH YOUR NAUSEA MEDICATION *UNUSUAL SHORTNESS OF BREATH *UNUSUAL BRUISING OR BLEEDING *URINARY PROBLEMS (pain or burning when urinating, or frequent urination) *BOWEL PROBLEMS (unusual diarrhea, constipation, pain near the anus) TENDERNESS IN MOUTH AND THROAT WITH OR WITHOUT PRESENCE OF ULCERS (sore throat, sores in mouth, or a toothache) UNUSUAL RASH, SWELLING OR PAIN  UNUSUAL VAGINAL DISCHARGE OR ITCHING   Items with * indicate a potential emergency and should be followed up as soon as possible or go to the Emergency Department if any problems should occur.  Please show the CHEMOTHERAPY ALERT CARD or IMMUNOTHERAPY  ALERT CARD at check-in to the Emergency Department and triage nurse.  Should you have questions after your visit or need to cancel or reschedule your appointment, please contact [facility] - A DEPT OF [facility]  [phone] and follow the prompts.  Office hours are 8:00 a.m. to 4:30 p.m. Monday - Friday. Please note that voicemails left after 4:00 p.m. may not be returned until the following business day.  We are closed weekends and major holidays. You have access to a nurse at all times for urgent questions. Please call the main number to the clinic [phone] and follow the prompts.  For any non-urgent questions, you may also contact your provider using MyChart. We now offer e-Visits for anyone 36 and older to request care online for non-urgent symptoms. For details visit [email or web address].   Also download the MyChart app! Go to the app store, search "MyChart", open the app, select [place], and log in with your MyChart username and password.   Carfilzomib Injection What is this medication? CARFILZOMIB (kar FILZ oh mib) treats multiple myeloma, a type of bone marrow cancer. It works by blocking a protein that causes cancer cells to grow and multiply. This helps to slow or stop the spread of cancer cells. This medicine may be used for other purposes; ask your health care provider or pharmacist if you have questions. COMMON BRAND NAME(S): KYPROLIS What should I tell my care team before I take this medication? They need to know if you have any of these conditions: Heart disease History of blood clots  Irregular heartbeat Kidney disease Liver disease Lung or breathing disease An unusual or allergic reaction to carfilzomib, or other medications, foods, dyes, or preservatives If you or your partner are pregnant or trying to get pregnant Breastfeeding How should I use this medication? This medication is injected into a vein. It is given by your  care team in a hospital or clinic setting. Talk to your care team about the use of this medication in children. Special care may be needed. Overdosage: If you think you have taken too much of this medicine contact a poison control center or emergency room at once. NOTE: This medicine is only for you. Do not share this medicine with others. What if I miss a dose? Keep appointments for follow-up doses. It is important not to miss your dose. Call your care team if you are unable to keep an appointment. What may interact with this medication? Interactions are not expected. This list may not describe all possible interactions. Give your health care provider a list of all the medicines, herbs, non-prescription drugs, or dietary supplements you use. Also tell them if you smoke, drink alcohol, or use illegal drugs. Some items may interact with your medicine. What should I watch for while using this medication? Your condition will be monitored carefully while you are receiving this medication. You may need blood work while taking this medication. Check with your care team if you have severe diarrhea, nausea, and vomiting, or if you sweat a lot. The loss of too much body fluid may make it dangerous for you to take this medication. This medication may affect your coordination, reaction time, or judgment. Do not drive or operate machinery until you know how this medication affects you. Sit up or stand slowly to reduce the risk of dizzy or fainting spells. Drinking alcohol with this medication can increase the risk of these side effects. Talk to your care team if you may be pregnant. Serious birth defects can occur if you take this medication during pregnancy and for 6 months after the last dose. You will need a negative pregnancy test before starting this medication. Contraception is recommended while taking this medication and for 6 months after the last dose. Your care team can help you find an option that works  for you. If your partner can get pregnant, use a condom during sex while taking this medication and for 3 months after the last dose. Do not breastfeed while taking this medication and for 2 weeks after the last dose. This medication may cause infertility. Talk to your care team if you are concerned about your fertility. What side effects may I notice from receiving this medication? Side effects that you should report to your care team as soon as possible: Allergic reactions--skin rash, itching, hives, swelling of the face, lips, tongue, or throat Bleeding--bloody or black, tar-like stools, vomiting blood or brown material that looks like coffee grounds, red or dark brown urine, small red or purple spots on skin, unusual bruising or bleeding Blood clot--pain, swelling, or warmth in the leg, shortness of breath, chest pain Dizziness, loss of balance or coordination, confusion or trouble speaking Heart attack--pain or tightness in the chest, shoulders, arms, or jaw, nausea, shortness of breath, cold or clammy skin, feeling faint or lightheaded Heart failure--shortness of breath, swelling of the ankles, feet, or hands, sudden weight gain, unusual weakness or fatigue Heart rhythm changes--fast or irregular heartbeat, dizziness, feeling faint or lightheaded, chest pain, trouble breathing Increase in blood pressure Infection--fever, chills,  cough, sore throat, wounds that don't heal, pain or trouble when passing urine, general feeling of discomfort or being unwell Infusion reactions--chest pain, shortness of breath or trouble breathing, feeling faint or lightheaded Kidney injury--decrease in the amount of urine, swelling of the ankles, hands, or feet Liver injury--right upper belly pain, loss of appetite, nausea, light-colored stool, dark yellow or brown urine, yellowing skin or eyes, unusual weakness or fatigue Lung injury--shortness of breath or trouble breathing, cough, spitting up blood, chest pain,  fever Pulmonary hypertension--shortness of breath, chest pain, fast or irregular heartbeat, feeling faint or lightheaded, fatigue, swelling of the ankles or feet Stomach pain, bloody diarrhea, pale skin, unusual weakness or fatigue, decrease in the amount of urine, which may be signs of hemolytic uremic syndrome Sudden and severe headache, confusion, change in vision, seizures, which may be signs of posterior reversible encephalopathy syndrome (PRES) TTP--purple spots on the skin or inside the mouth, pale skin, yellowing skin or eyes, unusual weakness or fatigue, fever, fast or irregular heartbeat, confusion, change in vision, trouble speaking, trouble walking Tumor lysis syndrome (TLS)--nausea, vomiting, diarrhea, decrease in the amount of urine, dark urine, unusual weakness or fatigue, confusion, muscle pain or cramps, fast or irregular heartbeat, joint pain Side effects that usually do not require medical attention (report to your care team if they continue or are bothersome): Diarrhea Fatigue Nausea Trouble sleeping This list may not describe all possible side effects. Call your doctor for medical advice about side effects. You may report side effects to FDA at 1-800-FDA-1088. Where should I keep my medication? This medication is given in a hospital or clinic. It will not be stored at home. NOTE: This sheet is a summary. It may not cover all possible information. If you have questions about this medicine, talk to your doctor, pharmacist, or health care provider.  2024 [employer] Standard (2022-01-28 00:00:00)

## 2024-01-31 NOTE — Progress Notes (Signed)
 Patient here for oncology follow-up appointment, expresses concerns of recent fall

## 2024-01-31 NOTE — Assessment & Plan Note (Signed)
 Encourage oral hydration and avoid nephrotoxins.

## 2024-01-31 NOTE — Assessment & Plan Note (Signed)
 Chemotherapy plan as listed above

## 2024-01-31 NOTE — Progress Notes (Signed)
 Hematology/Oncology Progress note [phone] [phone]         Patient Care Team: Sari Cunning, MD as PCP - General (Internal Medicine) Constancia Delton, MD as PCP - Cardiology (Cardiology) Ardeen Kohler, MD as PCP - Electrophysiology (Cardiology) [name], MD as Consulting Physician (Oncology)  CHIEF COMPLAINTS/REASON FOR VISIT:  Multiple myeloma   ASSESSMENT & PLAN:   Cancer Staging  Multiple myeloma [facility]) Staging form: Plasma Cell Myeloma and Plasma Cell Disorders, AJCC 8th Edition - Clinical stage from 04/14/2023: RISS Stage II (Beta-2 -microglobulin (mg/L): 8.4, Albumin (g/dL): 2.6, ISS: Stage III, High-risk cytogenetics: Absent, LDH: Normal) - Signed by [name], MD on 04/22/2023   Multiple myeloma (HCC) Bone marrow biopsy was reviewed. 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  Currently on 2nd line Carfilzomib  D1, 8 15 Q28 days and Selinexor  60mg  Dexamethasone  weekly  Proceed with cycle 1 D8 Carfilzomib , continue weekly Selinexor  60mg  Dexamethasone    Prophylaxis  Continue Acyclovir , At risk of thrombosis, on Eliquis  5mg  BID for A fib Xegeva monthly if calcium  meets criteria Recommend calcium  and vitamin D  supplementation.    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  Stable count Monitor.   Atrial fibrillation (HCC) Echo was done on 06/14/23 LVEF 50% Continue Eliquis  5mg  BID    CKD (chronic kidney disease) stage 3, GFR 30-59 ml/min (HCC) Encourage oral hydration and avoid nephrotoxins.    Encounter for antineoplastic chemotherapy Chemotherapy plan as listed above.    Orders Placed This Encounter  Procedures   CBC with Differential (Cancer Center Only)     Standing Status:   Future    Expected Date:   02/22/2024    Expiration Date:   02/21/2025   CMP (Cancer Center only)    Standing Status:   Future    Expected Date:   02/22/2024    Expiration Date:   02/21/2025   CBC with Differential (Cancer Center Only)    Standing Status:   Future    Expected Date:   02/29/2024    Expiration Date:   02/28/2025   CMP (Cancer Center only)    Standing Status:   Future    Expected Date:   02/29/2024    Expiration Date:   02/28/2025   CBC with Differential (Cancer Center Only)    Standing Status:   Future    Expected Date:   03/07/2024    Expiration Date:   03/07/2025   CMP (Cancer Center only)    Standing Status:   Future    Expected Date:   03/07/2024    Expiration Date:   03/07/2025   Follow-up per LOS  We spent sufficient time to discuss many aspect of care, questions were answered to patient's satisfaction.    [name], MD, PhD [facility] Hematology Oncology 01/31/2024     HISTORY OF PRESENTING ILLNESS:  Victor Phillips is a  68 y.o.  male with PMH listed below who was referred to me for anemia  02/11/2023 - 02/14/2023 recent hospitalization due to pneumonia, respiratory failure.  He was found to have a hemoglobin decreased at 6.8, status post PRBC transfusion during admission.  EGD showed gastritis.  Colonoscopy was not remarkable. 02/11/2023 TIBC 221 ferritin 107, iron saturation 16. Patient is  currently taking fusion plus Vitamin B12 level in the 300s. His echo showed grade 2 diastolic CHF.  He denies recent chest pain on exertion, shortness of breath on minimal exertion, pre-syncopal episodes, or palpitations He had not noticed any recent bleeding such as epistaxis, hematuria or hematochezia.  He denies over the counter NSAID ingestion.  Oncology History  Multiple myeloma (HCC)  04/14/2023 Initial Diagnosis   Multiple myeloma   03/13/2020 multiple myeloma panel showed M protein 4.3, IgA lambda Free lamda Level 142,-light chain ratio  0.07 04/05/2023 bone marrow biopsy showed hypercellular bone marrow with plasma cell neoplasm.The bone marrow is hypercellular for age with prominent increase in plasma cells representing 65% of all cells in the aspirate associated with interstitial infiltrates and numerous variably sized aggregates in  the clot and biopsy sections.  The plasma cells display lambda light chain restriction consistent with plasma cell neoplasm.  Normal cytogenetics.  FISH studies pending.    04/14/2023 Cancer Staging   Staging form: Plasma Cell Myeloma and Plasma Cell Disorders, AJCC 8th Edition - Clinical stage from 04/14/2023: RISS Stage II (Beta-2 -microglobulin (mg/L): 8.4, Albumin (g/dL): 2.6, ISS: Stage III, High-risk cytogenetics: Absent, LDH: Normal) - Signed by [name], MD on 04/22/2023 Stage prefix: Initial diagnosis Beta 2 microglobulin range (mg/L): Greater than or equal to 5.5 Albumin range (g/dL): Less than 3.5 Cytogenetics: 1q addition, Other mutation   04/14/2023 Imaging   Skeletal survey 1. No lytic lesions or other intrinsic bony abnormality. 2. Borderline cardiomegaly with an interval decrease in size. 3. Diffuse atheromatous arterial calcifications including bilateral carotid artery calcifications, right greater than left   04/22/2023 - 11/11/2023 Chemotherapy   Patient is on Treatment Plan : MYELOMA NEWLY DIAGNOSED TRANSPLANT CANDIDATE DaraVRd (Daratumumab  SQ) q21d     04/25/2023 Imaging   PET scan showed  1. Two tiny hypermetabolic lucent lesions in the left posteromedial eighth and ninth ribs. No additional evidence of multiple myeloma. 2. Aortic atherosclerosis (ICD10-I70.0). Coronary artery calcification.   07/14/2023 - 07/19/2023 Hospital Admission   Hospitalized due to ileus.    07/25/2023 Bone Marrow Biopsy   A-C. Peripheral blood and bone marrow, (peripheral smear, aspirate smear, touch preparation, clot section, core biopsy):   Persistent plasma cell myeloma, 50-60% lambda restricted  plasma cells on core biopsy. Hypercellular bone marrow (70%) with trilineage hematopoiesis. Negative for increased blasts. Negative for significant dysplasia. Negative for amyloid deposition.   01/25/2024 -  Chemotherapy   Patient is on Treatment Plan : MYELOMA RELAPSED/ REFRACTORY Carfilzomib  D1,8,15 (20/27) + selinexor   + Dexamethasone  (KPd) q28d       A Fib. on Eliquis  5mg  BID, follows up with cardiology.  He tolerated treatments so far. Had one episode of loose BM after taking Selinexor . He took imodium  and diarrhea resolved.  He denies nausea vomiting, chest pain, abdominal pain,    MEDICAL HISTORY:  Past Medical History:  Diagnosis Date   Atrial fibrillation (HCC)    Diabetes mellitus without complication (HCC)    Hypercholesteremia    Hypertension    Multiple myeloma not having achieved remission (HCC)     SURGICAL HISTORY: Past Surgical History:  Procedure Laterality Date   CARDIOVERSION N/A 10/17/2023   Procedure: CARDIOVERSION;  Surgeon: Constancia Delton, MD;  Location: [facility] ORS;  Service: Cardiovascular;  Laterality: N/A;   COLONOSCOPY N/A 02/13/2023   Procedure: COLONOSCOPY;  Surgeon: Toledo, Alphonsus Jeans, MD;  Location: [facility] ENDOSCOPY;  Service: Gastroenterology;  Laterality: N/A;   COLONOSCOPY N/A 02/14/2023   Procedure: COLONOSCOPY;  Surgeon: Brillion,  Teodoro K, MD;  Location: [facility] ENDOSCOPY;  Service: Gastroenterology;  Laterality: N/A;   ESOPHAGOGASTRODUODENOSCOPY N/A 02/13/2023   Procedure: ESOPHAGOGASTRODUODENOSCOPY (EGD);  Surgeon: Toledo, Alphonsus Jeans, MD;  Location: [facility] ENDOSCOPY;  Service: Gastroenterology;  Laterality: N/A;   IR BONE MARROW BIOPSY & ASPIRATION  11/24/2023    SOCIAL HISTORY: Social History   Socioeconomic History   Marital status: Married    Spouse name: Not on file   Number of children: Not on file   Years of education: Not on file   Highest education level: Not on file  Occupational History   Not on file  Tobacco Use   Smoking status:  Never   Smokeless tobacco: Never  Vaping Use   Vaping status: Never Used  Substance and Sexual Activity   Alcohol use: Not Currently    Comment: quit approx 2001   Drug use: No   Sexual activity: Not on file  Other Topics Concern   Not on file  Social History Narrative   Not on file   Social Drivers of Health   Financial Resource Strain: Medium Risk (01/13/2024)   Overall Financial Resource Strain (CARDIA)    Difficulty of Paying Living Expenses: Somewhat hard  Food Insecurity: Food Insecurity Present (01/13/2024)   Hunger Vital Sign    Worried About Running Out of Food in the Last Year: Sometimes true    Ran Out of Food in the Last Year: Sometimes true  Transportation Needs: No Transportation Needs (01/13/2024)   PRAPARE - Administrator, Civil Service (Medical): No    Lack of Transportation (Non-Medical): No  Physical Activity: Insufficiently Active (10/12/2023)   [facility]   Exercise Vital Sign    Days of Exercise per Week: 7 days    Minutes of Exercise per Session: 10 min  Stress: No Stress Concern Present (10/12/2023)   Received from [facility] - Occupational Stress Questionnaire    Feeling of Stress : Not at all  Recent Concern: Stress - Stress Concern Present (10/04/2023)   Received from [facility] of [facility]ational Health - Occupational Stress Questionnaire    Feeling of Stress : Rather much  Social Connections: Moderately Integrated (10/12/2023)   Received from [facility]   Social Connection and Isolation Panel [NHANES]    Frequency of Communication with Friends and Family: More than three times a week    Frequency of Social Gatherings with Friends and Family: More than three times a week    Attends Religious Services: 1 to 4 times per year    Active Member of [employer] or Organizations: No    Attends Tax inspector Meetings: Never    Marital Status: Married  Catering manager Violence: Not At Risk (07/14/2023)   Humiliation, Afraid, Rape, and Kick questionnaire    Fear of Current or Ex-Partner: No    Emotionally Abused: No    Physically Abused: No    Sexually Abused: No    FAMILY HISTORY: Family History  Problem Relation Age of Onset   Diabetes Mother    Heart attack Father     ALLERGIES:  has no known allergies.  MEDICATIONS:  Current Outpatient Medications  Medication Sig Dispense Refill   acetaminophen  (TYLENOL ) 325 MG tablet Take 2 tablets (650 mg total) by mouth every 6 (six) hours as needed for mild pain (or Fever >/= 101). 90 tablet 1  acyclovir  (ZOVIRAX ) 400 MG tablet Take 1 tablet (400 mg total) by mouth 2 (two) times daily. 180 tablet 0   apixaban  (ELIQUIS ) 5 MG TABS tablet Take 1 tablet (5 mg total) by mouth 2 (two) times daily. 60 tablet 3   atorvastatin  (LIPITOR) 10 MG tablet Take 10 mg by mouth daily.     calcium  carbonate (OS-CAL) 600 MG TABS tablet Take 2 tablets (1,200 mg total) by mouth daily.     cholecalciferol (VITAMIN D3) 25 MCG (1000 UNIT) tablet Take 1 tablet (1,000 Units total) by mouth daily.     dexamethasone  (DECADRON ) 4 MG tablet Take 5 tablets (20 mg) for 1 day on the week after chemotherapy (day 22). 5 tablet 11   furosemide  (LASIX ) 20 MG tablet Take 1 tablet (20 mg total) by mouth daily as needed for edema or fluid. 90 tablet 0   glipiZIDE (GLUCOTROL XL) 2.5 MG 24 hr tablet Take 5 mg by mouth daily with breakfast.     loperamide  (IMODIUM ) 2 MG capsule Take 1 capsule (2 mg total) by mouth See admin instructions. With onset of diarrhea, take 4 mg,then 2 mg every 2 hours or after every loose bowel movements  maximum: 16 mg/day 60 capsule 2   metoprolol  succinate (TOPROL -XL) 50 MG 24 hr tablet Take 1.5 tablets (75 mg total) by mouth daily. Take with or immediately following a meal. 135 tablet 3   Multiple Vitamin (MULTIVITAMIN WITH MINERALS) TABS tablet  Take 1 tablet by mouth daily. 90 tablet 1   ondansetron  (ZOFRAN ) 8 MG tablet Take 1 tablet (8 mg total) by mouth every 8 (eight) hours as needed for nausea or vomiting. 90 tablet 1   pantoprazole  (PROTONIX ) 40 MG tablet Take 1 tablet (40 mg total) by mouth daily. 30 tablet 1   prochlorperazine  (COMPAZINE ) 10 MG tablet Take 1 tablet (10 mg total) by mouth every 6 (six) hours as needed for nausea or vomiting. 90 tablet 1   selinexor  (XPOVIO , 60 MG ONCE WEEKLY,) Therapy Pack (60 mg once weekly) Take 60 mg by mouth once a week.     temazepam (RESTORIL) 15 MG capsule Take 15 mg by mouth at bedtime.     No current facility-administered medications for this visit.   Facility-Administered Medications Ordered in Other Visits  Medication Dose Route Frequency Provider Last Rate Last Admin   0.9 %  sodium chloride  infusion   Intravenous Continuous [name], MD   Stopped at 01/31/24 1110    Review of Systems  Constitutional:  Positive for fatigue. Negative for appetite change, chills, fever and unexpected weight change.  HENT:   Negative for hearing loss and voice change.   Eyes:  Negative for eye problems and icterus.  Respiratory:  Negative for chest tightness, cough and shortness of breath.   Cardiovascular:  Negative for chest pain and leg swelling.  Gastrointestinal:  Negative for abdominal distention, abdominal pain and diarrhea.  Endocrine: Negative for hot flashes.  Genitourinary:  Negative for difficulty urinating, dysuria and frequency.   Musculoskeletal:  Negative for arthralgias.  Skin:  Negative for itching and rash.  Neurological:  Negative for light-headedness and numbness.  Hematological:  Negative for adenopathy. Does not bruise/bleed easily.  Psychiatric/Behavioral:  Positive for sleep disturbance. Negative for confusion.     PHYSICAL EXAMINATION: Vitals:   01/31/24 0908  BP: (!) 139/90  Pulse: (!) 50  Resp: 17  Temp: (!) 96 F (35.6 C)  SpO2: 100%   Filed Weights    01/31/24 0908  Weight: 160 lb (72.6 kg)    Physical Exam Constitutional:      General: He is not in acute distress.    Appearance: He is obese.  HENT:     Head: Normocephalic and atraumatic.  Eyes:     General: No scleral icterus. Cardiovascular:     Rate and Rhythm: Normal rate and regular rhythm.  Pulmonary:     Effort: Pulmonary effort is normal. No respiratory distress.     Breath sounds: No wheezing.  Abdominal:     General: Bowel sounds are normal. There is no distension.     Palpations: Abdomen is soft.  Musculoskeletal:        General: No deformity. Normal range of motion.     Cervical back: Normal range of motion and neck supple.     Right lower leg: Edema present.     Left lower leg: Edema present.  Skin:    General: Skin is warm and dry.     Findings: No erythema or rash.  Neurological:     Mental Status: He is alert and oriented to person, place, and time. Mental status is at baseline.     Cranial Nerves: No cranial nerve deficit.  Psychiatric:        Mood and Affect: Mood normal.      LABORATORY DATA:  I have reviewed the data as listed    Latest Ref Rng & Units 01/31/2024    8:44 AM 01/25/2024    8:01 AM 01/12/2024    8:00 AM  CBC  WBC 4.0 - 10.5 K/uL 6.6  5.0  4.9   Hemoglobin 13.0 - 17.0 g/dL 16.1  09.6  04.5   Hematocrit 39.0 - 52.0 % 31.0  30.9  32.0   Platelets 150 - 400 K/uL 153  150  153       Latest Ref Rng & Units 01/31/2024    8:44 AM 01/25/2024    8:01 AM 01/12/2024    8:00 AM  CMP  Glucose 70 - 99 mg/dL 409  811  914   BUN 8 - 23 mg/dL 30  30  23    Creatinine 0.61 - 1.24 mg/dL 7.82  9.56  2.13   Sodium 135 - 145 mmol/L 133  136  137   Potassium 3.5 - 5.1 mmol/L 4.5  4.0  4.0   Chloride 98 - 111 mmol/L 101  101  103   CO2 22 - 32 mmol/L 24  26  25    Calcium  8.9 - 10.3 mg/dL 8.9  9.3  8.9   Total Protein 6.5 - 8.1 g/dL 8.7  9.0  9.1   Total Bilirubin 0.0 - 1.2 mg/dL 0.6  0.6  0.5   Alkaline Phos 38 - 126 U/L 98  95  80   AST 15 - 41  U/L 38  47  46   ALT 0 - 44 U/L 86  74  81    Lab Results  Component Value Date   IRON 100 03/14/2023   TIBC 272 03/14/2023   IRONPCTSAT 37 03/14/2023   FERRITIN 249 03/14/2023     RADIOGRAPHIC STUDIES: I have personally reviewed the radiological images as listed and agreed with the findings in the report. No results found.

## 2024-01-31 NOTE — Assessment & Plan Note (Signed)
 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  Stable count Monitor.

## 2024-01-31 NOTE — Assessment & Plan Note (Signed)
 Echo was done on 06/14/23 LVEF 50% Continue Eliquis  5mg  BID

## 2024-02-01 ENCOUNTER — Other Ambulatory Visit: Payer: Self-pay

## 2024-02-01 ENCOUNTER — Encounter: Payer: Self-pay | Admitting: Oncology

## 2024-02-07 MED FILL — Dexamethasone Sodium Phosphate Inj 100 MG/10ML: INTRAMUSCULAR | Qty: 2 | Status: AC

## 2024-02-08 ENCOUNTER — Inpatient Hospital Stay

## 2024-02-08 ENCOUNTER — Encounter: Payer: Self-pay | Admitting: Oncology

## 2024-02-08 ENCOUNTER — Inpatient Hospital Stay (HOSPITAL_BASED_OUTPATIENT_CLINIC_OR_DEPARTMENT_OTHER): Admitting: Oncology

## 2024-02-08 VITALS — BP 93/80 | HR 75 | Temp 96.0°F | Resp 18 | Wt 156.7 lb

## 2024-02-08 VITALS — BP 132/94 | HR 77 | Resp 16

## 2024-02-08 DIAGNOSIS — D6481 Anemia due to antineoplastic chemotherapy: Secondary | ICD-10-CM

## 2024-02-08 DIAGNOSIS — K521 Toxic gastroenteritis and colitis: Secondary | ICD-10-CM | POA: Insufficient documentation

## 2024-02-08 DIAGNOSIS — C9000 Multiple myeloma not having achieved remission: Secondary | ICD-10-CM

## 2024-02-08 DIAGNOSIS — N1832 Chronic kidney disease, stage 3b: Secondary | ICD-10-CM | POA: Diagnosis not present

## 2024-02-08 DIAGNOSIS — E875 Hyperkalemia: Secondary | ICD-10-CM

## 2024-02-08 DIAGNOSIS — I4891 Unspecified atrial fibrillation: Secondary | ICD-10-CM

## 2024-02-08 DIAGNOSIS — T451X5A Adverse effect of antineoplastic and immunosuppressive drugs, initial encounter: Secondary | ICD-10-CM

## 2024-02-08 DIAGNOSIS — Z5112 Encounter for antineoplastic immunotherapy: Secondary | ICD-10-CM | POA: Diagnosis not present

## 2024-02-08 DIAGNOSIS — Z5111 Encounter for antineoplastic chemotherapy: Secondary | ICD-10-CM

## 2024-02-08 LAB — CMP (CANCER CENTER ONLY)
ALT: 105 U/L — ABNORMAL HIGH (ref 0–44)
AST: 36 U/L (ref 15–41)
Albumin: 3.7 g/dL (ref 3.5–5.0)
Alkaline Phosphatase: 96 U/L (ref 38–126)
Anion gap: 9 (ref 5–15)
BUN: 31 mg/dL — ABNORMAL HIGH (ref 8–23)
CO2: 25 mmol/L (ref 22–32)
Calcium: 9.2 mg/dL (ref 8.9–10.3)
Chloride: 100 mmol/L (ref 98–111)
Creatinine: 0.9 mg/dL (ref 0.61–1.24)
GFR, Estimated: >60 mL/min (ref >60)
Glucose, Bld: 162 mg/dL — ABNORMAL HIGH (ref 70–99)
Potassium: 5.2 mmol/L — ABNORMAL HIGH (ref 3.5–5.1)
Sodium: 134 mmol/L — ABNORMAL LOW (ref 135–145)
Total Bilirubin: 0.8 mg/dL (ref 0.0–1.2)
Total Protein: 7.7 g/dL (ref 6.5–8.1)

## 2024-02-08 LAB — CBC WITH DIFFERENTIAL (CANCER CENTER ONLY)
Abs Immature Granulocytes: 0.09 10*3/uL — ABNORMAL HIGH (ref 0.00–0.07)
Basophils Absolute: 0 10*3/uL (ref 0.0–0.1)
Basophils Relative: 0 %
Eosinophils Absolute: 0 10*3/uL (ref 0.0–0.5)
Eosinophils Relative: 0 %
HCT: 33.3 % — ABNORMAL LOW (ref 39.0–52.0)
Hemoglobin: 11.4 g/dL — ABNORMAL LOW (ref 13.0–17.0)
Immature Granulocytes: 2 %
Lymphocytes Relative: 15 %
Lymphs Abs: 0.9 10*3/uL (ref 0.7–4.0)
MCH: 34.3 pg — ABNORMAL HIGH (ref 26.0–34.0)
MCHC: 34.2 g/dL (ref 30.0–36.0)
MCV: 100.3 fL — ABNORMAL HIGH (ref 80.0–100.0)
Monocytes Absolute: 0.4 10*3/uL (ref 0.1–1.0)
Monocytes Relative: 7 %
Neutro Abs: 4.6 10*3/uL (ref 1.7–7.7)
Neutrophils Relative %: 76 %
Platelet Count: 174 10*3/uL (ref 150–400)
RBC: 3.32 MIL/uL — ABNORMAL LOW (ref 4.22–5.81)
RDW: 14.6 % (ref 11.5–15.5)
WBC Count: 6.1 10*3/uL (ref 4.0–10.5)
nRBC: 0 % (ref 0.0–0.2)

## 2024-02-08 MED ORDER — SELINEXOR (60 MG ONCE WEEKLY) 60 MG PO TBPK: 60.0000 mg | ORAL_TABLET | ORAL | 4 refills | Status: DC

## 2024-02-08 MED ORDER — SODIUM CHLORIDE 0.9 % IV SOLN
Freq: Once | INTRAVENOUS | Status: AC
  Filled 2024-02-08: qty 250

## 2024-02-08 MED ORDER — DEXTROSE 5 % IV SOLN
27.0000 mg/m2 | Freq: Once | INTRAVENOUS | Status: AC
  Administered 2024-02-08: 50 mg via INTRAVENOUS
  Filled 2024-02-08: qty 15

## 2024-02-08 MED ORDER — SODIUM CHLORIDE 0.9 % IV SOLN
20.0000 mg | Freq: Once | INTRAVENOUS | Status: AC
  Administered 2024-02-08: 20 mg via INTRAVENOUS
  Filled 2024-02-08: qty 20

## 2024-02-08 NOTE — Progress Notes (Signed)
 Hematology/Oncology Progress note [phone] [phone]         Patient Care Team: Sari Cunning, MD as PCP - General (Internal Medicine) Constancia Delton, MD as PCP - Cardiology (Cardiology) Ardeen Kohler, MD as PCP - Electrophysiology (Cardiology) [name], MD as Consulting Physician (Oncology)  CHIEF COMPLAINTS/REASON FOR VISIT:  Multiple myeloma   ASSESSMENT & PLAN:   Cancer Staging  Multiple myeloma [name]) Staging form: Plasma Cell Myeloma and Plasma Cell Disorders, AJCC 8th Edition - Clinical stage from 04/14/2023: RISS Stage II (Beta-2 -microglobulin (mg/L): 8.4, Albumin (g/dL): 2.6, ISS: Stage III, High-risk cytogenetics: Absent, LDH: Normal) - Signed by [name], MD on 04/22/2023   Multiple myeloma (HCC) Bone marrow biopsy was reviewed. 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  Currently on 2nd line Carfilzomib  D1, 8 15 Q28 days and Selinexor  60mg  Dexamethasone  weekly  Proceed with cycle 1 D15 Carfilzomib , continue weekly Selinexor  60mg  Dexamethasone   He knows to take Dexamethasone  and  Selinexor  60mg  on D22  Prophylaxis  Continue Acyclovir , At risk of thrombosis, on Eliquis  5mg  BID for A fib Xegeva monthly if calcium  meets criteria Recommend calcium  and vitamin D  supplementation.    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  Stable count Monitor.   Atrial fibrillation (HCC) Echo was done on 06/14/23 LVEF 50% Continue Eliquis  5mg  BID    CKD (chronic kidney disease) stage 3, GFR 30-59 ml/min (HCC) Encourage oral hydration and avoid nephrotoxins.    Encounter for antineoplastic chemotherapy Chemotherapy plan as listed above.   Chemotherapy induced  diarrhea Recommend imodium  PRN  Hyperkalemia Recommend patient to avoid potassium rich food.     Orders Placed This Encounter  Procedures   CBC with Differential (Cancer Center Only)    Standing Status:   Future    Expected Date:   03/21/2024    Expiration Date:   03/21/2025   CMP (Cancer Center only)    Standing Status:   Future    Expected Date:   03/21/2024    Expiration Date:   03/21/2025   CBC with Differential (Cancer Center Only)    Standing Status:   Future    Expected Date:   03/28/2024    Expiration Date:   03/28/2025   CMP (Cancer Center only)    Standing Status:   Future    Expected Date:   03/28/2024    Expiration Date:   03/28/2025   CBC with Differential (Cancer Center Only)    Standing Status:   Future    Expected Date:   04/04/2024    Expiration Date:   04/04/2025   CMP (Cancer Center only)    Standing Status:   Future    Expected Date:   04/04/2024    Expiration Date:   04/04/2025   Kappa/lambda light chains    Standing Status:   Future    Expected Date:   02/22/2024    Expiration Date:   02/21/2025   Multiple Myeloma Panel (SPEP&IFE w/QIG)    Standing Status:   Future    Expected Date:   02/22/2024    Expiration Date:   02/21/2025   Kappa/lambda light chains    Standing Status:   Future    Expected Date:   03/21/2024  Expiration Date:   03/21/2025   Multiple Myeloma Panel (SPEP&IFE w/QIG)    Standing Status:   Future    Expected Date:   03/21/2024    Expiration Date:   03/21/2025   Kappa/lambda light chains    Standing Status:   Future    Expected Date:   04/18/2024    Expiration Date:   04/18/2025   Multiple Myeloma Panel (SPEP&IFE w/QIG)    Standing Status:   Future    Expected Date:   04/18/2024    Expiration Date:   04/18/2025   CBC with Differential (Cancer Center Only)    Standing Status:   Future    Expected Date:   04/18/2024    Expiration Date:   04/18/2025   CMP (Cancer Center only)    Standing Status:   Future    Expected Date:   04/18/2024    Expiration Date:    04/18/2025   CBC with Differential (Cancer Center Only)    Standing Status:   Future    Expected Date:   04/25/2024    Expiration Date:   04/25/2025   CMP (Cancer Center only)    Standing Status:   Future    Expected Date:   04/25/2024    Expiration Date:   04/25/2025   CBC with Differential (Cancer Center Only)    Standing Status:   Future    Expected Date:   05/02/2024    Expiration Date:   05/02/2025   CMP (Cancer Center only)    Standing Status:   Future    Expected Date:   05/02/2024    Expiration Date:   05/02/2025   Follow-up per LOS  We spent sufficient time to discuss many aspect of care, questions were answered to patient's satisfaction.    [name], MD, PhD [facility] Hematology Oncology 02/08/2024     HISTORY OF PRESENTING ILLNESS:  Victor Phillips is a  68 y.o.  male with PMH listed below who was referred to me for anemia  02/11/2023 - 02/14/2023 recent hospitalization due to pneumonia, respiratory failure.  He was found to have a hemoglobin decreased at 6.8, status post PRBC transfusion during admission.  EGD showed gastritis.  Colonoscopy was not remarkable. 02/11/2023 TIBC 221 ferritin 107, iron saturation 16. Patient is currently taking fusion plus Vitamin B12 level in the 300s. His echo showed grade 2 diastolic CHF.  He denies recent chest pain on exertion, shortness of breath on minimal exertion, pre-syncopal episodes, or palpitations He had not noticed any recent bleeding such as epistaxis, hematuria or hematochezia.  He denies over the counter NSAID ingestion.  Oncology History  Multiple myeloma (HCC)  04/14/2023 Initial Diagnosis   Multiple myeloma   03/13/2020 multiple myeloma panel showed M protein 4.3, IgA lambda Free lamda Level 142,-light chain ratio 0.07 04/05/2023 bone marrow biopsy showed hypercellular bone marrow with plasma cell neoplasm.The bone marrow is hypercellular for age with prominent increase in plasma cells representing 65% of all cells in the  aspirate associated with interstitial infiltrates and numerous variably sized aggregates in  the clot and biopsy sections.  The plasma cells display lambda light chain restriction consistent with plasma cell neoplasm.  Normal cytogenetics.  FISH studies pending.    04/14/2023 Cancer Staging   Staging form: Plasma Cell Myeloma and Plasma Cell Disorders, AJCC 8th Edition - Clinical stage from 04/14/2023: RISS Stage II (Beta-2 -microglobulin (mg/L): 8.4, Albumin (g/dL): 2.6, ISS: Stage III, High-risk cytogenetics: Absent, LDH: Normal) - Signed by Wilhelmenia Harada,  Allyne Areola, MD on 04/22/2023 Stage prefix: Initial diagnosis Beta 2 microglobulin range (mg/L): Greater than or equal to 5.5 Albumin range (g/dL): Less than 3.5 Cytogenetics: 1q addition, Other mutation   04/14/2023 Imaging   Skeletal survey 1. No lytic lesions or other intrinsic bony abnormality. 2. Borderline cardiomegaly with an interval decrease in size. 3. Diffuse atheromatous arterial calcifications including bilateral carotid artery calcifications, right greater than left   04/22/2023 - 11/11/2023 Chemotherapy   Patient is on Treatment Plan : MYELOMA NEWLY DIAGNOSED TRANSPLANT CANDIDATE DaraVRd (Daratumumab  SQ) q21d     04/25/2023 Imaging   PET scan showed  1. Two tiny hypermetabolic lucent lesions in the left posteromedial eighth and ninth ribs. No additional evidence of multiple myeloma. 2. Aortic atherosclerosis (ICD10-I70.0). Coronary artery calcification.   07/14/2023 - 07/19/2023 Hospital Admission   Hospitalized due to ileus.    07/25/2023 Bone Marrow Biopsy   A-C. Peripheral blood and bone marrow, (peripheral smear, aspirate smear, touch preparation, clot section, core biopsy):   Persistent plasma cell myeloma, 50-60% lambda restricted plasma cells on core biopsy. Hypercellular bone marrow (70%) with trilineage hematopoiesis. Negative for increased blasts. Negative for significant dysplasia. Negative for amyloid deposition.   01/25/2024 -   Chemotherapy   Patient is on Treatment Plan : MYELOMA RELAPSED/ REFRACTORY Carfilzomib  D1,8,15 (20/27) + selinexor   + Dexamethasone  (KPd) q28d       A Fib. on Eliquis  5mg  BID, follows up with cardiology.  He tolerated treatments so far. Had 2 episode of loose BM after taking Selinexor . He took imodium  and diarrhea resolved.  He denies nausea vomiting, chest pain, abdominal pain,    MEDICAL HISTORY:  Past Medical History:  Diagnosis Date   Atrial fibrillation (HCC)    Diabetes mellitus without complication (HCC)    Hypercholesteremia    Hypertension    Multiple myeloma not having achieved remission (HCC)     SURGICAL HISTORY: Past Surgical History:  Procedure Laterality Date   CARDIOVERSION N/A 10/17/2023   Procedure: CARDIOVERSION;  Surgeon: Constancia Delton, MD;  Location: [facility] ORS;  Service: Cardiovascular;  Laterality: N/A;   COLONOSCOPY N/A 02/13/2023   Procedure: COLONOSCOPY;  Surgeon: Toledo, Alphonsus Jeans, MD;  Location: [facility] ENDOSCOPY;  Service: Gastroenterology;  Laterality: N/A;   COLONOSCOPY N/A 02/14/2023   Procedure: COLONOSCOPY;  Surgeon: Toledo, Alphonsus Jeans, MD;  Location: [facility] ENDOSCOPY;  Service: Gastroenterology;  Laterality: N/A;   ESOPHAGOGASTRODUODENOSCOPY N/A 02/13/2023   Procedure: ESOPHAGOGASTRODUODENOSCOPY (EGD);  Surgeon: Toledo, Alphonsus Jeans, MD;  Location: [facility] ENDOSCOPY;  Service: Gastroenterology;  Laterality: N/A;   IR BONE MARROW BIOPSY & ASPIRATION  11/24/2023    SOCIAL HISTORY: Social History   Socioeconomic History   Marital status: Married    Spouse name: Not on file   Number of children: Not on file   Years of education: Not on file   Highest education level: Not on file  Occupational History   Not on file  Tobacco Use   Smoking status: Never   Smokeless tobacco: Never  Vaping Use   Vaping status: Never Used  Substance and Sexual Activity   Alcohol use: Not Currently    Comment: quit approx 2001   Drug use: No   Sexual activity: Not on file   Other Topics Concern   Not on file  Social History Narrative   Not on file   Social Drivers of Health   Financial Resource Strain: Medium Risk (01/13/2024)   Overall Financial Resource Strain (CARDIA)    Difficulty of Paying Living Expenses: Somewhat  hard  Food Insecurity: Food Insecurity Present (01/13/2024)   Hunger Vital Sign    Worried About Running Out of Food in the Last Year: Sometimes true    Ran Out of Food in the Last Year: Sometimes true  Transportation Needs: No Transportation Needs (01/13/2024)   PRAPARE - Administrator, Civil Service (Medical): No    Lack of Transportation (Non-Medical): No  Physical Activity: Insufficiently Active (10/12/2023)   [facility]   Exercise Vital Sign    Days of Exercise per Week: 7 days    Minutes of Exercise per Session: 10 min  Stress: No Stress Concern Present (10/12/2023)   Received from [facility] of Occupational Health - Occupational Stress Questionnaire    Feeling of Stress : Not at all  Recent Concern: Stress - Stress Concern Present (10/04/2023)   Received from [facility] of Occupational Health - Occupational Stress Questionnaire    Feeling of Stress : Rather much  Social Connections: Moderately Integrated (10/12/2023)   Received from [facility]   Social Connection and Isolation Panel [NHANES]    Frequency of Communication with Friends and Family: More than three times a week    Frequency of Social Gatherings with Friends and Family: More than three times a week    Attends Religious Services: 1 to 4 times per year    Active Member of [employer] or Organizations: No    Attends Banker Meetings: Never    Marital Status: Married  Catering manager Violence: Not At Risk (07/14/2023)   Humiliation, Afraid, Rape, and Kick questionnaire    Fear of Current or Ex-Partner: No    Emotionally  Abused: No    Physically Abused: No    Sexually Abused: No    FAMILY HISTORY: Family History  Problem Relation Age of Onset   Diabetes Mother    Heart attack Father     ALLERGIES:  has no known allergies.  MEDICATIONS:  Current Outpatient Medications  Medication Sig Dispense Refill   acetaminophen  (TYLENOL ) 325 MG tablet Take 2 tablets (650 mg total) by mouth every 6 (six) hours as needed for mild pain (or Fever >/= 101). 90 tablet 1   acyclovir  (ZOVIRAX ) 400 MG tablet Take 1 tablet (400 mg total) by mouth 2 (two) times daily. 180 tablet 0   apixaban  (ELIQUIS ) 5 MG TABS tablet Take 1 tablet (5 mg total) by mouth 2 (two) times daily. 60 tablet 3   atorvastatin  (LIPITOR) 10 MG tablet Take 10 mg by mouth daily.     calcium  carbonate (OS-CAL) 600 MG TABS tablet Take 2 tablets (1,200 mg total) by mouth daily.     cholecalciferol (VITAMIN D3) 25 MCG (1000 UNIT) tablet Take 1 tablet (1,000 Units total) by mouth daily.     dexamethasone  (DECADRON ) 4 MG tablet Take 5 tablets (20 mg) for 1 day on the week after chemotherapy (day 22). 5 tablet 11   glipiZIDE (GLUCOTROL XL) 2.5 MG 24 hr tablet Take 5 mg by mouth daily with breakfast.     loperamide  (IMODIUM ) 2 MG capsule Take 1 capsule (2 mg total) by mouth See admin instructions. With onset of diarrhea, take 4 mg,then 2 mg every 2 hours or after every loose bowel movements  maximum: 16 mg/day 60 capsule 2   metoprolol  succinate (TOPROL -XL) 50 MG 24 hr tablet Take 1.5 tablets (75 mg total) by mouth daily.  Take with or immediately following a meal. 135 tablet 3   Multiple Vitamin (MULTIVITAMIN WITH MINERALS) TABS tablet Take 1 tablet by mouth daily. 90 tablet 1   ondansetron  (ZOFRAN ) 8 MG tablet Take 1 tablet (8 mg total) by mouth every 8 (eight) hours as needed for nausea or vomiting. 90 tablet 1   pantoprazole  (PROTONIX ) 40 MG tablet Take 1 tablet (40 mg total) by mouth daily. 30 tablet 1   prochlorperazine  (COMPAZINE ) 10 MG tablet Take 1 tablet  (10 mg total) by mouth every 6 (six) hours as needed for nausea or vomiting. 90 tablet 1   temazepam (RESTORIL) 15 MG capsule Take 15 mg by mouth at bedtime.     selinexor  (XPOVIO , 60 MG ONCE WEEKLY,) Therapy Pack (60 mg once weekly) Take 1 tablet (60 mg total) by mouth once a week. 4 tablet 4   No current facility-administered medications for this visit.   Facility-Administered Medications Ordered in Other Visits  Medication Dose Route Frequency Provider Last Rate Last Admin   carfilzomib  (KYPROLIS ) 50 mg in dextrose  5 % 50 mL chemo infusion  27 mg/m2 (Treatment Plan Recorded) Intravenous Once [name], MD 150 mL/hr at 02/08/24 1008 50 mg at 02/08/24 1008    Review of Systems  Constitutional:  Positive for fatigue. Negative for appetite change, chills, fever and unexpected weight change.  HENT:   Negative for hearing loss and voice change.   Eyes:  Negative for eye problems and icterus.  Respiratory:  Negative for chest tightness, cough and shortness of breath.   Cardiovascular:  Negative for chest pain and leg swelling.  Gastrointestinal:  Negative for abdominal distention, abdominal pain and diarrhea.  Endocrine: Negative for hot flashes.  Genitourinary:  Negative for difficulty urinating, dysuria and frequency.   Musculoskeletal:  Negative for arthralgias.  Skin:  Negative for itching and rash.  Neurological:  Negative for light-headedness and numbness.  Hematological:  Negative for adenopathy. Does not bruise/bleed easily.  Psychiatric/Behavioral:  Positive for sleep disturbance. Negative for confusion.     PHYSICAL EXAMINATION: Vitals:   02/08/24 0832  BP: 93/80  Pulse: 75  Resp: 18  Temp: (!) 96 F (35.6 C)  SpO2: 98%   Filed Weights   02/08/24 0832  Weight: 156 lb 11.2 oz (71.1 kg)    Physical Exam Constitutional:      General: He is not in acute distress.    Appearance: He is obese.  HENT:     Head: Normocephalic and atraumatic.  Eyes:     General: No scleral  icterus. Cardiovascular:     Rate and Rhythm: Normal rate and regular rhythm.  Pulmonary:     Effort: Pulmonary effort is normal. No respiratory distress.     Breath sounds: No wheezing.  Abdominal:     General: Bowel sounds are normal. There is no distension.     Palpations: Abdomen is soft.  Musculoskeletal:        General: No deformity. Normal range of motion.     Cervical back: Normal range of motion and neck supple.     Right lower leg: Edema present.     Left lower leg: Edema present.  Skin:    General: Skin is warm and dry.     Findings: No erythema or rash.  Neurological:     Mental Status: He is alert and oriented to person, place, and time. Mental status is at baseline.     Cranial Nerves: No cranial nerve deficit.  Psychiatric:  Mood and Affect: Mood normal.      LABORATORY DATA:  I have reviewed the data as listed    Latest Ref Rng & Units 02/08/2024    7:58 AM 01/31/2024    8:44 AM 01/25/2024    8:01 AM  CBC  WBC 4.0 - 10.5 K/uL 6.1  6.6  5.0   Hemoglobin 13.0 - 17.0 g/dL 56.2  13.0  86.5   Hematocrit 39.0 - 52.0 % 33.3  31.0  30.9   Platelets 150 - 400 K/uL 174  153  150       Latest Ref Rng & Units 02/08/2024    7:58 AM 01/31/2024    8:44 AM 01/25/2024    8:01 AM  CMP  Glucose 70 - 99 mg/dL 784  696  295   BUN 8 - 23 mg/dL 31  30  30    Creatinine 0.61 - 1.24 mg/dL 2.84  1.32  4.40   Sodium 135 - 145 mmol/L 134  133  136   Potassium 3.5 - 5.1 mmol/L 5.2  4.5  4.0   Chloride 98 - 111 mmol/L 100  101  101   CO2 22 - 32 mmol/L 25  24  26    Calcium  8.9 - 10.3 mg/dL 9.2  8.9  9.3   Total Protein 6.5 - 8.1 g/dL 7.7  8.7  9.0   Total Bilirubin 0.0 - 1.2 mg/dL 0.8  0.6  0.6   Alkaline Phos 38 - 126 U/L 96  98  95   AST 15 - 41 U/L 36  38  47   ALT 0 - 44 U/L 105  86  74    Lab Results  Component Value Date   IRON 100 03/14/2023   TIBC 272 03/14/2023   IRONPCTSAT 37 03/14/2023   FERRITIN 249 03/14/2023     RADIOGRAPHIC STUDIES: I have  personally reviewed the radiological images as listed and agreed with the findings in the report. No results found.

## 2024-02-08 NOTE — Assessment & Plan Note (Signed)
Recommend imodium PRN

## 2024-02-08 NOTE — Assessment & Plan Note (Signed)
 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  Stable count Monitor.

## 2024-02-08 NOTE — Assessment & Plan Note (Signed)
 Echo was done on 06/14/23 LVEF 50% Continue Eliquis  5mg  BID

## 2024-02-08 NOTE — Assessment & Plan Note (Signed)
 Chemotherapy plan as listed above

## 2024-02-08 NOTE — Patient Instructions (Signed)
 [facility] - A DEPT OF [facility]  Discharge Instructions: Thank you for choosing [facility] to provide your oncology and hematology care.  If you have a lab appointment with the Cancer Center, please go directly to the Cancer Center and check in at the registration area.  Wear comfortable clothing and clothing appropriate for easy access to any Portacath or PICC line.   We strive to give you quality time with your provider. You may need to reschedule your appointment if you arrive late (15 or more minutes).  Arriving late affects you and other patients whose appointments are after yours.  Also, if you miss three or more appointments without notifying the office, you may be dismissed from the clinic at the provider's discretion.      For prescription refill requests, have your pharmacy contact our office and allow 72 hours for refills to be completed.    Today you received the following chemotherapy and/or immunotherapy agents KYPROLIS      To help prevent nausea and vomiting after your treatment, we encourage you to take your nausea medication as directed.  BELOW ARE SYMPTOMS THAT SHOULD BE REPORTED IMMEDIATELY: *FEVER GREATER THAN 100.4 F (38 C) OR HIGHER *CHILLS OR SWEATING *NAUSEA AND VOMITING THAT IS NOT CONTROLLED WITH YOUR NAUSEA MEDICATION *UNUSUAL SHORTNESS OF BREATH *UNUSUAL BRUISING OR BLEEDING *URINARY PROBLEMS (pain or burning when urinating, or frequent urination) *BOWEL PROBLEMS (unusual diarrhea, constipation, pain near the anus) TENDERNESS IN MOUTH AND THROAT WITH OR WITHOUT PRESENCE OF ULCERS (sore throat, sores in mouth, or a toothache) UNUSUAL RASH, SWELLING OR PAIN  UNUSUAL VAGINAL DISCHARGE OR ITCHING   Items with * indicate a potential emergency and should be followed up as soon as possible or go to the Emergency Department if any problems should occur.  Please show the CHEMOTHERAPY ALERT CARD or IMMUNOTHERAPY  ALERT CARD at check-in to the Emergency Department and triage nurse.  Should you have questions after your visit or need to cancel or reschedule your appointment, please contact [facility] - A DEPT OF [facility]  [phone] and follow the prompts.  Office hours are 8:00 a.m. to 4:30 p.m. Monday - Friday. Please note that voicemails left after 4:00 p.m. may not be returned until the following business day.  We are closed weekends and major holidays. You have access to a nurse at all times for urgent questions. Please call the main number to the clinic [phone] and follow the prompts.  For any non-urgent questions, you may also contact your provider using MyChart. We now offer e-Visits for anyone 36 and older to request care online for non-urgent symptoms. For details visit [email or web address].   Also download the MyChart app! Go to the app store, search "MyChart", open the app, select [place], and log in with your MyChart username and password.   Carfilzomib Injection What is this medication? CARFILZOMIB (kar FILZ oh mib) treats multiple myeloma, a type of bone marrow cancer. It works by blocking a protein that causes cancer cells to grow and multiply. This helps to slow or stop the spread of cancer cells. This medicine may be used for other purposes; ask your health care provider or pharmacist if you have questions. COMMON BRAND NAME(S): KYPROLIS What should I tell my care team before I take this medication? They need to know if you have any of these conditions: Heart disease History of blood clots  Irregular heartbeat Kidney disease Liver disease Lung or breathing disease An unusual or allergic reaction to carfilzomib, or other medications, foods, dyes, or preservatives If you or your partner are pregnant or trying to get pregnant Breastfeeding How should I use this medication? This medication is injected into a vein. It is given by your  care team in a hospital or clinic setting. Talk to your care team about the use of this medication in children. Special care may be needed. Overdosage: If you think you have taken too much of this medicine contact a poison control center or emergency room at once. NOTE: This medicine is only for you. Do not share this medicine with others. What if I miss a dose? Keep appointments for follow-up doses. It is important not to miss your dose. Call your care team if you are unable to keep an appointment. What may interact with this medication? Interactions are not expected. This list may not describe all possible interactions. Give your health care provider a list of all the medicines, herbs, non-prescription drugs, or dietary supplements you use. Also tell them if you smoke, drink alcohol, or use illegal drugs. Some items may interact with your medicine. What should I watch for while using this medication? Your condition will be monitored carefully while you are receiving this medication. You may need blood work while taking this medication. Check with your care team if you have severe diarrhea, nausea, and vomiting, or if you sweat a lot. The loss of too much body fluid may make it dangerous for you to take this medication. This medication may affect your coordination, reaction time, or judgment. Do not drive or operate machinery until you know how this medication affects you. Sit up or stand slowly to reduce the risk of dizzy or fainting spells. Drinking alcohol with this medication can increase the risk of these side effects. Talk to your care team if you may be pregnant. Serious birth defects can occur if you take this medication during pregnancy and for 6 months after the last dose. You will need a negative pregnancy test before starting this medication. Contraception is recommended while taking this medication and for 6 months after the last dose. Your care team can help you find an option that works  for you. If your partner can get pregnant, use a condom during sex while taking this medication and for 3 months after the last dose. Do not breastfeed while taking this medication and for 2 weeks after the last dose. This medication may cause infertility. Talk to your care team if you are concerned about your fertility. What side effects may I notice from receiving this medication? Side effects that you should report to your care team as soon as possible: Allergic reactions--skin rash, itching, hives, swelling of the face, lips, tongue, or throat Bleeding--bloody or black, tar-like stools, vomiting blood or brown material that looks like coffee grounds, red or dark brown urine, small red or purple spots on skin, unusual bruising or bleeding Blood clot--pain, swelling, or warmth in the leg, shortness of breath, chest pain Dizziness, loss of balance or coordination, confusion or trouble speaking Heart attack--pain or tightness in the chest, shoulders, arms, or jaw, nausea, shortness of breath, cold or clammy skin, feeling faint or lightheaded Heart failure--shortness of breath, swelling of the ankles, feet, or hands, sudden weight gain, unusual weakness or fatigue Heart rhythm changes--fast or irregular heartbeat, dizziness, feeling faint or lightheaded, chest pain, trouble breathing Increase in blood pressure Infection--fever, chills,  cough, sore throat, wounds that don't heal, pain or trouble when passing urine, general feeling of discomfort or being unwell Infusion reactions--chest pain, shortness of breath or trouble breathing, feeling faint or lightheaded Kidney injury--decrease in the amount of urine, swelling of the ankles, hands, or feet Liver injury--right upper belly pain, loss of appetite, nausea, light-colored stool, dark yellow or brown urine, yellowing skin or eyes, unusual weakness or fatigue Lung injury--shortness of breath or trouble breathing, cough, spitting up blood, chest pain,  fever Pulmonary hypertension--shortness of breath, chest pain, fast or irregular heartbeat, feeling faint or lightheaded, fatigue, swelling of the ankles or feet Stomach pain, bloody diarrhea, pale skin, unusual weakness or fatigue, decrease in the amount of urine, which may be signs of hemolytic uremic syndrome Sudden and severe headache, confusion, change in vision, seizures, which may be signs of posterior reversible encephalopathy syndrome (PRES) TTP--purple spots on the skin or inside the mouth, pale skin, yellowing skin or eyes, unusual weakness or fatigue, fever, fast or irregular heartbeat, confusion, change in vision, trouble speaking, trouble walking Tumor lysis syndrome (TLS)--nausea, vomiting, diarrhea, decrease in the amount of urine, dark urine, unusual weakness or fatigue, confusion, muscle pain or cramps, fast or irregular heartbeat, joint pain Side effects that usually do not require medical attention (report to your care team if they continue or are bothersome): Diarrhea Fatigue Nausea Trouble sleeping This list may not describe all possible side effects. Call your doctor for medical advice about side effects. You may report side effects to FDA at 1-800-FDA-1088. Where should I keep my medication? This medication is given in a hospital or clinic. It will not be stored at home. NOTE: This sheet is a summary. It may not cover all possible information. If you have questions about this medicine, talk to your doctor, pharmacist, or health care provider.  2024 [employer] Standard (2022-01-28 00:00:00)

## 2024-02-08 NOTE — Assessment & Plan Note (Addendum)
 Bone marrow biopsy was reviewed. 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  Currently on 2nd line Carfilzomib  D1, 8 15 Q28 days and Selinexor  60mg  Dexamethasone  weekly  Proceed with cycle 1 D15 Carfilzomib , continue weekly Selinexor  60mg  Dexamethasone   He knows to take Dexamethasone  and  Selinexor  60mg  on D22  Prophylaxis  Continue Acyclovir , At risk of thrombosis, on Eliquis  5mg  BID for A fib Xegeva monthly if calcium  meets criteria Recommend calcium  and vitamin D  supplementation.

## 2024-02-08 NOTE — Assessment & Plan Note (Signed)
 Recommend patient to avoid potassium rich food.

## 2024-02-08 NOTE — Assessment & Plan Note (Signed)
 Encourage oral hydration and avoid nephrotoxins.

## 2024-02-09 ENCOUNTER — Other Ambulatory Visit: Payer: Self-pay

## 2024-02-10 ENCOUNTER — Other Ambulatory Visit: Payer: Self-pay

## 2024-02-21 MED FILL — Dexamethasone Sodium Phosphate Inj 100 MG/10ML: INTRAMUSCULAR | Qty: 2 | Status: AC

## 2024-02-22 ENCOUNTER — Inpatient Hospital Stay

## 2024-02-22 ENCOUNTER — Inpatient Hospital Stay: Attending: Oncology

## 2024-02-22 ENCOUNTER — Inpatient Hospital Stay (HOSPITAL_BASED_OUTPATIENT_CLINIC_OR_DEPARTMENT_OTHER): Admitting: Oncology

## 2024-02-22 ENCOUNTER — Encounter: Payer: Self-pay | Admitting: Oncology

## 2024-02-22 VITALS — BP 108/90 | HR 100 | Temp 98.3°F | Resp 16 | Ht 67.0 in | Wt 152.0 lb

## 2024-02-22 VITALS — BP 105/68 | HR 60

## 2024-02-22 DIAGNOSIS — K521 Toxic gastroenteritis and colitis: Secondary | ICD-10-CM

## 2024-02-22 DIAGNOSIS — C9000 Multiple myeloma not having achieved remission: Secondary | ICD-10-CM | POA: Insufficient documentation

## 2024-02-22 DIAGNOSIS — I4891 Unspecified atrial fibrillation: Secondary | ICD-10-CM

## 2024-02-22 DIAGNOSIS — Z79899 Other long term (current) drug therapy: Secondary | ICD-10-CM | POA: Diagnosis not present

## 2024-02-22 DIAGNOSIS — D6481 Anemia due to antineoplastic chemotherapy: Secondary | ICD-10-CM

## 2024-02-22 DIAGNOSIS — Z5111 Encounter for antineoplastic chemotherapy: Secondary | ICD-10-CM

## 2024-02-22 DIAGNOSIS — R634 Abnormal weight loss: Secondary | ICD-10-CM | POA: Diagnosis not present

## 2024-02-22 DIAGNOSIS — T451X5A Adverse effect of antineoplastic and immunosuppressive drugs, initial encounter: Secondary | ICD-10-CM

## 2024-02-22 DIAGNOSIS — Z5112 Encounter for antineoplastic immunotherapy: Secondary | ICD-10-CM | POA: Insufficient documentation

## 2024-02-22 DIAGNOSIS — N1832 Chronic kidney disease, stage 3b: Secondary | ICD-10-CM

## 2024-02-22 LAB — CBC WITH DIFFERENTIAL (CANCER CENTER ONLY)
Abs Immature Granulocytes: 0.02 10*3/uL (ref 0.00–0.07)
Basophils Absolute: 0 10*3/uL (ref 0.0–0.1)
Basophils Relative: 0 %
Eosinophils Absolute: 0 10*3/uL (ref 0.0–0.5)
Eosinophils Relative: 0 %
HCT: 34.5 % — ABNORMAL LOW (ref 39.0–52.0)
Hemoglobin: 12.1 g/dL — ABNORMAL LOW (ref 13.0–17.0)
Immature Granulocytes: 0 %
Lymphocytes Relative: 14 %
Lymphs Abs: 0.8 10*3/uL (ref 0.7–4.0)
MCH: 35.2 pg — ABNORMAL HIGH (ref 26.0–34.0)
MCHC: 35.1 g/dL (ref 30.0–36.0)
MCV: 100.3 fL — ABNORMAL HIGH (ref 80.0–100.0)
Monocytes Absolute: 0.4 10*3/uL (ref 0.1–1.0)
Monocytes Relative: 7 %
Neutro Abs: 4.5 10*3/uL (ref 1.7–7.7)
Neutrophils Relative %: 79 %
Platelet Count: 152 10*3/uL (ref 150–400)
RBC: 3.44 MIL/uL — ABNORMAL LOW (ref 4.22–5.81)
RDW: 14.6 % (ref 11.5–15.5)
WBC Count: 5.7 10*3/uL (ref 4.0–10.5)
nRBC: 0 % (ref 0.0–0.2)

## 2024-02-22 LAB — CMP (CANCER CENTER ONLY)
ALT: 99 U/L — ABNORMAL HIGH (ref 0–44)
AST: 31 U/L (ref 15–41)
Albumin: 4.2 g/dL (ref 3.5–5.0)
Alkaline Phosphatase: 94 U/L (ref 38–126)
Anion gap: 7 (ref 5–15)
BUN: 27 mg/dL — ABNORMAL HIGH (ref 8–23)
CO2: 25 mmol/L (ref 22–32)
Calcium: 9.1 mg/dL (ref 8.9–10.3)
Chloride: 101 mmol/L (ref 98–111)
Creatinine: 0.97 mg/dL (ref 0.61–1.24)
GFR, Estimated: >60 mL/min (ref >60)
Glucose, Bld: 205 mg/dL — ABNORMAL HIGH (ref 70–99)
Potassium: 4.8 mmol/L (ref 3.5–5.1)
Sodium: 133 mmol/L — ABNORMAL LOW (ref 135–145)
Total Bilirubin: 0.8 mg/dL (ref 0.0–1.2)
Total Protein: 7.6 g/dL (ref 6.5–8.1)

## 2024-02-22 MED ORDER — SODIUM CHLORIDE 0.9 % IV SOLN
INTRAVENOUS | Status: DC
  Filled 2024-02-22: qty 250

## 2024-02-22 MED ORDER — SODIUM CHLORIDE 0.9 % IV SOLN
20.0000 mg | Freq: Once | INTRAVENOUS | Status: AC
  Administered 2024-02-22: 20 mg via INTRAVENOUS
  Filled 2024-02-22: qty 20

## 2024-02-22 MED ORDER — SODIUM CHLORIDE 0.9 % IV SOLN
Freq: Once | INTRAVENOUS | Status: AC
  Filled 2024-02-22: qty 250

## 2024-02-22 MED ORDER — SODIUM CHLORIDE 0.9% FLUSH
10.0000 mL | INTRAVENOUS | Status: DC | PRN
  Administered 2024-02-22: 10 mL
  Filled 2024-02-22: qty 10

## 2024-02-22 MED ORDER — DENOSUMAB 120 MG/1.7ML ~~LOC~~ SOLN
120.0000 mg | Freq: Once | SUBCUTANEOUS | Status: AC
  Administered 2024-02-22: 120 mg via SUBCUTANEOUS
  Filled 2024-02-22: qty 1.7

## 2024-02-22 MED ORDER — SELINEXOR (60 MG ONCE WEEKLY) 60 MG PO TBPK: 60.0000 mg | ORAL_TABLET | ORAL | 4 refills | Status: DC

## 2024-02-22 MED ORDER — DEXTROSE 5 % IV SOLN
27.0000 mg/m2 | Freq: Once | INTRAVENOUS | Status: AC
  Administered 2024-02-22: 50 mg via INTRAVENOUS
  Filled 2024-02-22: qty 15

## 2024-02-22 NOTE — Assessment & Plan Note (Signed)
 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  Stable count Monitor.

## 2024-02-22 NOTE — Assessment & Plan Note (Signed)
 Chemotherapy plan as listed above

## 2024-02-22 NOTE — Assessment & Plan Note (Addendum)
Follow up with nutritionist.  

## 2024-02-22 NOTE — Assessment & Plan Note (Signed)
 Echo was done on 06/14/23 LVEF 50% Continue Eliquis  5mg  BID

## 2024-02-22 NOTE — Progress Notes (Signed)
 Hematology/Oncology Progress note [phone] [phone]         Patient Care Team: Sari Cunning, MD as PCP - General (Internal Medicine) Constancia Delton, MD as PCP - Cardiology (Cardiology) Ardeen Kohler, MD as PCP - Electrophysiology (Cardiology) [name], MD as Consulting Physician (Oncology)  CHIEF COMPLAINTS/REASON FOR VISIT:  Multiple myeloma   ASSESSMENT & PLAN:   Cancer Staging  Multiple myeloma [facility]) Staging form: Plasma Cell Myeloma and Plasma Cell Disorders, AJCC 8th Edition - Clinical stage from 04/14/2023: RISS Stage II (Beta-2 -microglobulin (mg/L): 8.4, Albumin (g/dL): 2.6, ISS: Stage III, High-risk cytogenetics: Absent, LDH: Normal) - Signed by [name], MD on 04/22/2023   Weight loss Follow up with nutritionist.   Multiple myeloma (HCC) Bone marrow biopsy was reviewed. 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  Currently on 2nd line Carfilzomib  D1, 8 15 Q28 days and Selinexor  60mg  Dexamethasone  weekly  Proceed with cycle 2 D1Carfilzomib, continue weekly Selinexor  60mg  Dexamethasone     Prophylaxis  Continue Acyclovir , At risk of thrombosis, on Eliquis  5mg  BID for A fib Xegeva monthly if calcium  meets criteria Recommend calcium  and vitamin D  supplementation.    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  Stable count Monitor.   Atrial fibrillation (HCC) Echo was done on 06/14/23 LVEF 50% Continue Eliquis  5mg  BID    Chemotherapy induced diarrhea Manageable symptoms.  Recommend imodium  PRN  CKD (chronic kidney disease) stage 3, GFR 30-59 ml/min (HCC) Encourage oral hydration and avoid nephrotoxins.    Encounter for antineoplastic  chemotherapy Chemotherapy plan as listed above.     No orders of the defined types were placed in this encounter.  Follow-up per LOS  We spent sufficient time to discuss many aspect of care, questions were answered to patient's satisfaction.    [name], MD, PhD [facility] Hematology Oncology 02/22/2024     HISTORY OF PRESENTING ILLNESS:  Victor Phillips is a  68 y.o.  male with PMH listed below who was referred to me for anemia  02/11/2023 - 02/14/2023 recent hospitalization due to pneumonia, respiratory failure.  He was found to have a hemoglobin decreased at 6.8, status post PRBC transfusion during admission.  EGD showed gastritis.  Colonoscopy was not remarkable. 02/11/2023 TIBC 221 ferritin 107, iron saturation 16. Patient is currently taking fusion plus Vitamin B12 level in the 300s. His echo showed grade 2 diastolic CHF.  He denies recent chest pain on exertion, shortness of breath on minimal exertion, pre-syncopal episodes, or palpitations He had not noticed any recent bleeding such as epistaxis, hematuria or hematochezia.  He denies over the counter NSAID ingestion.  Oncology History  Multiple myeloma (HCC)  04/14/2023 Initial Diagnosis   Multiple myeloma   03/13/2020 multiple myeloma panel showed M protein 4.3, IgA lambda Free lamda Level 142,-light chain ratio 0.07 04/05/2023 bone marrow biopsy showed hypercellular bone marrow with plasma cell neoplasm.The bone marrow is hypercellular for age with prominent increase in plasma cells representing 65% of all cells in the aspirate associated with interstitial infiltrates and numerous variably sized aggregates in  the clot and biopsy sections.  The plasma cells display lambda light chain restriction consistent with plasma cell neoplasm.  Normal cytogenetics.  FISH studies pending.    04/14/2023  Cancer Staging   Staging form: Plasma Cell Myeloma and Plasma Cell Disorders, AJCC 8th Edition - Clinical stage from 04/14/2023: RISS  Stage II (Beta-2 -microglobulin (mg/L): 8.4, Albumin (g/dL): 2.6, ISS: Stage III, High-risk cytogenetics: Absent, LDH: Normal) - Signed by [name], MD on 04/22/2023 Stage prefix: Initial diagnosis Beta 2 microglobulin range (mg/L): Greater than or equal to 5.5 Albumin range (g/dL): Less than 3.5 Cytogenetics: 1q addition, Other mutation   04/14/2023 Imaging   Skeletal survey 1. No lytic lesions or other intrinsic bony abnormality. 2. Borderline cardiomegaly with an interval decrease in size. 3. Diffuse atheromatous arterial calcifications including bilateral carotid artery calcifications, right greater than left   04/22/2023 - 11/11/2023 Chemotherapy   Patient is on Treatment Plan : MYELOMA NEWLY DIAGNOSED TRANSPLANT CANDIDATE DaraVRd (Daratumumab  SQ) q21d     04/25/2023 Imaging   PET scan showed  1. Two tiny hypermetabolic lucent lesions in the left posteromedial eighth and ninth ribs. No additional evidence of multiple myeloma. 2. Aortic atherosclerosis (ICD10-I70.0). Coronary artery calcification.   07/14/2023 - 07/19/2023 Hospital Admission   Hospitalized due to ileus.    07/25/2023 Bone Marrow Biopsy   A-C. Peripheral blood and bone marrow, (peripheral smear, aspirate smear, touch preparation, clot section, core biopsy):   Persistent plasma cell myeloma, 50-60% lambda restricted plasma cells on core biopsy. Hypercellular bone marrow (70%) with trilineage hematopoiesis. Negative for increased blasts. Negative for significant dysplasia. Negative for amyloid deposition.   01/25/2024 -  Chemotherapy   Patient is on Treatment Plan : MYELOMA RELAPSED/ REFRACTORY Carfilzomib  D1,8,15 (20/27) + selinexor   + Dexamethasone  (KPd) q28d       A Fib. on Eliquis  5mg  BID, follows up with cardiology.  He tolerated treatments so far. Had 2-3 episode of loose BM after taking Selinexor . He took imodium  and diarrhea resolved.  He denies nausea vomiting, chest pain, abdominal pain,  No leg swelling,  SOB.  Appetite is good. Lost 2-3 pounds    MEDICAL HISTORY:  Past Medical History:  Diagnosis Date   Atrial fibrillation (HCC)    Diabetes mellitus without complication (HCC)    Hypercholesteremia    Hypertension    Multiple myeloma not having achieved remission (HCC)     SURGICAL HISTORY: Past Surgical History:  Procedure Laterality Date   CARDIOVERSION N/A 10/17/2023   Procedure: CARDIOVERSION;  Surgeon: Constancia Delton, MD;  Location: [facility] ORS;  Service: Cardiovascular;  Laterality: N/A;   COLONOSCOPY N/A 02/13/2023   Procedure: COLONOSCOPY;  Surgeon: Toledo, Alphonsus Jeans, MD;  Location: [facility] ENDOSCOPY;  Service: Gastroenterology;  Laterality: N/A;   COLONOSCOPY N/A 02/14/2023   Procedure: COLONOSCOPY;  Surgeon: Toledo, Alphonsus Jeans, MD;  Location: [facility] ENDOSCOPY;  Service: Gastroenterology;  Laterality: N/A;   ESOPHAGOGASTRODUODENOSCOPY N/A 02/13/2023   Procedure: ESOPHAGOGASTRODUODENOSCOPY (EGD);  Surgeon: Toledo, Alphonsus Jeans, MD;  Location: [facility] ENDOSCOPY;  Service: Gastroenterology;  Laterality: N/A;   IR BONE MARROW BIOPSY & ASPIRATION  11/24/2023    SOCIAL HISTORY: Social History   Socioeconomic History   Marital status: Married    Spouse name: Not on file   Number of children: Not on file   Years of education: Not on file   Highest education level: Not on file  Occupational History   Not on file  Tobacco Use   Smoking status: Never   Smokeless tobacco: Never  Vaping Use   Vaping status: Never Used  Substance and Sexual Activity   Alcohol use: Not Currently    Comment: quit approx 2001   Drug use: No  Sexual activity: Not on file  Other Topics Concern   Not on file  Social History Narrative   Not on file   Social Drivers of Health   Financial Resource Strain: Medium Risk (01/13/2024)   Overall Financial Resource Strain (CARDIA)    Difficulty of Paying Living Expenses: Somewhat hard  Food Insecurity: Food Insecurity Present (01/13/2024)   Hunger Vital Sign     Worried About Running Out of Food in the Last Year: Sometimes true    Ran Out of Food in the Last Year: Sometimes true  Transportation Needs: No Transportation Needs (01/13/2024)   PRAPARE - Administrator, Civil Service (Medical): No    Lack of Transportation (Non-Medical): No  Physical Activity: Insufficiently Active (10/12/2023)   Received from [facility]   Exercise Vital Sign    Days of Exercise per Week: 7 days    Minutes of Exercise per Session: 10 min  Stress: No Stress Concern Present (10/12/2023)   Received from [facility] of Occupational Health - Occupational Stress Questionnaire    Feeling of Stress : Not at all  Recent Concern: Stress - Stress Concern Present (10/04/2023)   Received from [facility] of Occupational Health - Occupational Stress Questionnaire    Feeling of Stress : Rather much  Social Connections: Moderately Integrated (10/12/2023)   Received from [facility]   Social Connection and Isolation Panel [NHANES]    Frequency of Communication with Friends and Family: More than three times a week    Frequency of Social Gatherings with Friends and Family: More than three times a week    Attends Religious Services: 1 to 4 times per year    Active Member of [employer] or Organizations: No    Attends Banker Meetings: Never    Marital Status: Married  Catering manager Violence: Not At Risk (07/14/2023)   Humiliation, Afraid, Rape, and Kick questionnaire    Fear of Current or Ex-Partner: No    Emotionally Abused: No    Physically Abused: No    Sexually Abused: No    FAMILY HISTORY: Family History  Problem Relation Age of Onset   Diabetes Mother    Heart attack Father     ALLERGIES:  has no known allergies.  MEDICATIONS:  Current Outpatient Medications  Medication Sig Dispense Refill   acetaminophen  (TYLENOL ) 325 MG tablet Take 2  tablets (650 mg total) by mouth every 6 (six) hours as needed for mild pain (or Fever >/= 101). 90 tablet 1   acyclovir  (ZOVIRAX ) 400 MG tablet Take 1 tablet (400 mg total) by mouth 2 (two) times daily. 180 tablet 0   apixaban  (ELIQUIS ) 5 MG TABS tablet Take 1 tablet (5 mg total) by mouth 2 (two) times daily. 60 tablet 3   atorvastatin  (LIPITOR) 10 MG tablet Take 10 mg by mouth daily.     calcium  carbonate (OS-CAL) 600 MG TABS tablet Take 2 tablets (1,200 mg total) by mouth daily.     cholecalciferol (VITAMIN D3) 25 MCG (1000 UNIT) tablet Take 1 tablet (1,000 Units total) by mouth daily.     dexamethasone  (DECADRON ) 4 MG tablet Take 5 tablets (20 mg) for 1 day on the week after chemotherapy (day 22). 5 tablet 11   glipiZIDE (GLUCOTROL XL) 2.5 MG 24 hr tablet Take 5 mg by mouth daily with breakfast.     loperamide  (IMODIUM ) 2 MG capsule Take  1 capsule (2 mg total) by mouth See admin instructions. With onset of diarrhea, take 4 mg,then 2 mg every 2 hours or after every loose bowel movements  maximum: 16 mg/day 60 capsule 2   metoprolol  succinate (TOPROL -XL) 50 MG 24 hr tablet Take 1.5 tablets (75 mg total) by mouth daily. Take with or immediately following a meal. 135 tablet 3   Multiple Vitamin (MULTIVITAMIN WITH MINERALS) TABS tablet Take 1 tablet by mouth daily. 90 tablet 1   ondansetron  (ZOFRAN ) 8 MG tablet Take 1 tablet (8 mg total) by mouth every 8 (eight) hours as needed for nausea or vomiting. 90 tablet 1   pantoprazole  (PROTONIX ) 40 MG tablet Take 1 tablet (40 mg total) by mouth daily. 30 tablet 1   prochlorperazine  (COMPAZINE ) 10 MG tablet Take 1 tablet (10 mg total) by mouth every 6 (six) hours as needed for nausea or vomiting. 90 tablet 1   temazepam (RESTORIL) 15 MG capsule Take 15 mg by mouth at bedtime.     selinexor  (XPOVIO , 60 MG ONCE WEEKLY,) Therapy Pack (60 mg once weekly) Take 1 tablet (60 mg total) by mouth once a week. 4 tablet 4   No current facility-administered medications  for this visit.   Facility-Administered Medications Ordered in Other Visits  Medication Dose Route Frequency Provider Last Rate Last Admin   0.9 %  sodium chloride  infusion   Intravenous Continuous [name], MD 10 mL/hr at 02/22/24 1001 New Bag at 02/22/24 1001   carfilzomib  (KYPROLIS ) 50 mg in dextrose  5 % 50 mL chemo infusion  27 mg/m2 (Treatment Plan Recorded) Intravenous Once [name], MD       denosumab  (XGEVA ) injection 120 mg  120 mg Subcutaneous Once Dymond Spreen, MD       dexamethasone  (DECADRON ) 20 mg in sodium chloride  0.9 % 50 mL IVPB  20 mg Intravenous Once Moriya Mitchell, MD 208 mL/hr at 02/22/24 0953 20 mg at 02/22/24 0953   sodium chloride  flush (NS) 0.9 % injection 10 mL  10 mL Intracatheter PRN [name], MD   10 mL at 02/22/24 0950    Review of Systems  Constitutional:  Positive for fatigue. Negative for appetite change, chills, fever and unexpected weight change.  HENT:   Negative for hearing loss and voice change.   Eyes:  Negative for eye problems and icterus.  Respiratory:  Negative for chest tightness, cough and shortness of breath.   Cardiovascular:  Negative for chest pain and leg swelling.  Gastrointestinal:  Negative for abdominal distention, abdominal pain and diarrhea.  Endocrine: Negative for hot flashes.  Genitourinary:  Negative for difficulty urinating, dysuria and frequency.   Musculoskeletal:  Negative for arthralgias.  Skin:  Negative for itching and rash.  Neurological:  Negative for light-headedness and numbness.  Hematological:  Negative for adenopathy. Does not bruise/bleed easily.  Psychiatric/Behavioral:  Positive for sleep disturbance. Negative for confusion.     PHYSICAL EXAMINATION: Vitals:   02/22/24 0856  BP: (!) 108/90  Pulse: 100  Resp: 16  Temp: 98.3 F (36.8 C)  SpO2: 99%   Filed Weights   02/22/24 0856  Weight: 152 lb (68.9 kg)    Physical Exam Constitutional:      General: He is not in acute distress.    Appearance: He is obese.   HENT:     Head: Normocephalic and atraumatic.  Eyes:     General: No scleral icterus. Cardiovascular:     Rate and Rhythm: Normal rate and regular rhythm.  Pulmonary:     Effort: Pulmonary effort is normal. No respiratory distress.     Breath sounds: No wheezing.  Abdominal:     General: Bowel sounds are normal. There is no distension.     Palpations: Abdomen is soft.  Musculoskeletal:        General: No deformity. Normal range of motion.     Cervical back: Normal range of motion and neck supple.     Right lower leg: No edema.     Left lower leg: No edema.  Skin:    General: Skin is warm and dry.     Findings: No erythema or rash.  Neurological:     Mental Status: He is alert and oriented to person, place, and time. Mental status is at baseline.     Cranial Nerves: No cranial nerve deficit.  Psychiatric:        Mood and Affect: Mood normal.      LABORATORY DATA:  I have reviewed the data as listed    Latest Ref Rng & Units 02/22/2024    8:25 AM 02/08/2024    7:58 AM 01/31/2024    8:44 AM  CBC  WBC 4.0 - 10.5 K/uL 5.7  6.1  6.6   Hemoglobin 13.0 - 17.0 g/dL 19.1  47.8  29.5   Hematocrit 39.0 - 52.0 % 34.5  33.3  31.0   Platelets 150 - 400 K/uL 152  174  153       Latest Ref Rng & Units 02/22/2024    8:26 AM 02/08/2024    7:58 AM 01/31/2024    8:44 AM  CMP  Glucose 70 - 99 mg/dL 621  308  657   BUN 8 - 23 mg/dL 27  31  30    Creatinine 0.61 - 1.24 mg/dL 8.46  9.62  9.52   Sodium 135 - 145 mmol/L 133  134  133   Potassium 3.5 - 5.1 mmol/L 4.8  5.2  4.5   Chloride 98 - 111 mmol/L 101  100  101   CO2 22 - 32 mmol/L 25  25  24    Calcium  8.9 - 10.3 mg/dL 9.1  9.2  8.9   Total Protein 6.5 - 8.1 g/dL 7.6  7.7  8.7   Total Bilirubin 0.0 - 1.2 mg/dL 0.8  0.8  0.6   Alkaline Phos 38 - 126 U/L 94  96  98   AST 15 - 41 U/L 31  36  38   ALT 0 - 44 U/L 99  105  86    Lab Results  Component Value Date   IRON 100 03/14/2023   TIBC 272 03/14/2023   IRONPCTSAT 37 03/14/2023    FERRITIN 249 03/14/2023     RADIOGRAPHIC STUDIES: I have personally reviewed the radiological images as listed and agreed with the findings in the report. No results found.

## 2024-02-22 NOTE — Assessment & Plan Note (Signed)
 Bone marrow biopsy was reviewed. 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  Currently on 2nd line Carfilzomib  D1, 8 15 Q28 days and Selinexor  60mg  Dexamethasone  weekly  Proceed with cycle 2 D1Carfilzomib, continue weekly Selinexor  60mg  Dexamethasone     Prophylaxis  Continue Acyclovir , At risk of thrombosis, on Eliquis  5mg  BID for A fib Xegeva monthly if calcium  meets criteria Recommend calcium  and vitamin D  supplementation.

## 2024-02-22 NOTE — Assessment & Plan Note (Signed)
 Manageable symptoms.  Recommend imodium  PRN

## 2024-02-22 NOTE — Progress Notes (Signed)
 Nutrition Follow-up:  Add on to schedule today due to wt loss Patient with multiple myeloma.  Receiving carfilzomib , second line therapy.  Hospitalization 5/31-6/3 due to pneumonia, anemia and gastritis  Met with patient and wife during infusion.  Patient reports that appetite is good.  Able to eat 2 ham biscuits for breakfast (regular size).  Yesterday afternoon ate 6 inch sub (meat, vegetables, cheese) and potato chips. Sometimes skips meals due to schedule.   Denies any nutrition impact symptoms at this time.  Has not been drinking protein shakes recently.     Medications: reviewed  Labs: glucose 205  Anthropometrics:   Weight 152 lb  (no edema) 162 lb on 10/13/23 181 lb on 08/29/23  6% weight loss in the last 4 months, concerning   NUTRITION DIAGNOSIS: Unintentional weight loss related to cancer as evidenced by 6% weight loss in the last 4 months, progression noted and hospitalization during time frame    INTERVENTION:  Add low sugar shake 1-2 times a day between meals. Continue meals at least 3 times a day.  Do not skip meals.    MONITORING, EVALUATION, GOAL: weight trends, intake   NEXT VISIT: Wednesday, June 25 during infusion  Ahmyah Gidley B. Zollie Hipp, CSO, LDN Registered Dietitian [phone]

## 2024-02-22 NOTE — Progress Notes (Signed)
 Patient is doing well, no new questions or concerns for the doctor today. He did tell me that his neuropathy has got better.

## 2024-02-22 NOTE — Assessment & Plan Note (Signed)
 Encourage oral hydration and avoid nephrotoxins.

## 2024-02-23 LAB — KAPPA/LAMBDA LIGHT CHAINS
Kappa free light chain: 2.3 mg/L — ABNORMAL LOW (ref 3.3–19.4)
Kappa, lambda light chain ratio: 0.02 — ABNORMAL LOW (ref 0.26–1.65)
Lambda free light chains: 94.5 mg/L — ABNORMAL HIGH (ref 5.7–26.3)

## 2024-02-24 ENCOUNTER — Other Ambulatory Visit: Payer: Self-pay

## 2024-02-28 ENCOUNTER — Other Ambulatory Visit: Payer: Self-pay

## 2024-02-29 ENCOUNTER — Inpatient Hospital Stay

## 2024-02-29 VITALS — BP 102/72 | HR 72 | Temp 97.8°F | Resp 18 | Wt 152.4 lb

## 2024-02-29 DIAGNOSIS — C9000 Multiple myeloma not having achieved remission: Secondary | ICD-10-CM

## 2024-02-29 DIAGNOSIS — Z5112 Encounter for antineoplastic immunotherapy: Secondary | ICD-10-CM | POA: Diagnosis not present

## 2024-02-29 LAB — CBC WITH DIFFERENTIAL (CANCER CENTER ONLY)
Abs Immature Granulocytes: 0.04 10*3/uL (ref 0.00–0.07)
Basophils Absolute: 0 10*3/uL (ref 0.0–0.1)
Basophils Relative: 0 %
Eosinophils Absolute: 0 10*3/uL (ref 0.0–0.5)
Eosinophils Relative: 0 %
HCT: 33 % — ABNORMAL LOW (ref 39.0–52.0)
Hemoglobin: 11.5 g/dL — ABNORMAL LOW (ref 13.0–17.0)
Immature Granulocytes: 1 %
Lymphocytes Relative: 9 %
Lymphs Abs: 0.7 10*3/uL (ref 0.7–4.0)
MCH: 35.3 pg — ABNORMAL HIGH (ref 26.0–34.0)
MCHC: 34.8 g/dL (ref 30.0–36.0)
MCV: 101.2 fL — ABNORMAL HIGH (ref 80.0–100.0)
Monocytes Absolute: 0.7 10*3/uL (ref 0.1–1.0)
Monocytes Relative: 8 %
Neutro Abs: 7.2 10*3/uL (ref 1.7–7.7)
Neutrophils Relative %: 82 %
Platelet Count: 117 10*3/uL — ABNORMAL LOW (ref 150–400)
RBC: 3.26 MIL/uL — ABNORMAL LOW (ref 4.22–5.81)
RDW: 14.6 % (ref 11.5–15.5)
WBC Count: 8.8 10*3/uL (ref 4.0–10.5)
nRBC: 0 % (ref 0.0–0.2)

## 2024-02-29 LAB — CMP (CANCER CENTER ONLY)
ALT: 74 U/L — ABNORMAL HIGH (ref 0–44)
AST: 27 U/L (ref 15–41)
Albumin: 4 g/dL (ref 3.5–5.0)
Alkaline Phosphatase: 105 U/L (ref 38–126)
Anion gap: 8 (ref 5–15)
BUN: 30 mg/dL — ABNORMAL HIGH (ref 8–23)
CO2: 22 mmol/L (ref 22–32)
Calcium: 8.8 mg/dL — ABNORMAL LOW (ref 8.9–10.3)
Chloride: 108 mmol/L (ref 98–111)
Creatinine: 0.92 mg/dL (ref 0.61–1.24)
GFR, Estimated: >60 mL/min (ref >60)
Glucose, Bld: 143 mg/dL — ABNORMAL HIGH (ref 70–99)
Potassium: 4.5 mmol/L (ref 3.5–5.1)
Sodium: 138 mmol/L (ref 135–145)
Total Bilirubin: 0.9 mg/dL (ref 0.0–1.2)
Total Protein: 7.3 g/dL (ref 6.5–8.1)

## 2024-02-29 MED ORDER — SODIUM CHLORIDE 0.9 % IV SOLN
20.0000 mg | Freq: Once | INTRAVENOUS | Status: AC
  Administered 2024-02-29: 20 mg via INTRAVENOUS
  Filled 2024-02-29: qty 20

## 2024-02-29 MED ORDER — SODIUM CHLORIDE 0.9 % IV SOLN
INTRAVENOUS | Status: DC
  Filled 2024-02-29: qty 250

## 2024-02-29 MED ORDER — SODIUM CHLORIDE 0.9 % IV SOLN
Freq: Once | INTRAVENOUS | Status: AC
  Filled 2024-02-29: qty 250

## 2024-02-29 MED ORDER — DEXTROSE 5 % IV SOLN
27.0000 mg/m2 | Freq: Once | INTRAVENOUS | Status: AC
  Administered 2024-02-29: 50 mg via INTRAVENOUS
  Filled 2024-02-29: qty 15

## 2024-02-29 NOTE — Patient Instructions (Signed)
 [facility] - A DEPT OF [facility]  Discharge Instructions: Thank you for choosing [facility] to provide your oncology and hematology care.  If you have a lab appointment with the Cancer Center, please go directly to the Cancer Center and check in at the registration area.  Wear comfortable clothing and clothing appropriate for easy access to any Portacath or PICC line.   We strive to give you quality time with your provider. You may need to reschedule your appointment if you arrive late (15 or more minutes).  Arriving late affects you and other patients whose appointments are after yours.  Also, if you miss three or more appointments without notifying the office, you may be dismissed from the clinic at the provider's discretion.      For prescription refill requests, have your pharmacy contact our office and allow 72 hours for refills to be completed.    Today you received the following chemotherapy and/or immunotherapy agents KYPRHOLIS      To help prevent nausea and vomiting after your treatment, we encourage you to take your nausea medication as directed.  BELOW ARE SYMPTOMS THAT SHOULD BE REPORTED IMMEDIATELY: *FEVER GREATER THAN 100.4 F (38 C) OR HIGHER *CHILLS OR SWEATING *NAUSEA AND VOMITING THAT IS NOT CONTROLLED WITH YOUR NAUSEA MEDICATION *UNUSUAL SHORTNESS OF BREATH *UNUSUAL BRUISING OR BLEEDING *URINARY PROBLEMS (pain or burning when urinating, or frequent urination) *BOWEL PROBLEMS (unusual diarrhea, constipation, pain near the anus) TENDERNESS IN MOUTH AND THROAT WITH OR WITHOUT PRESENCE OF ULCERS (sore throat, sores in mouth, or a toothache) UNUSUAL RASH, SWELLING OR PAIN  UNUSUAL VAGINAL DISCHARGE OR ITCHING   Items with * indicate a potential emergency and should be followed up as soon as possible or go to the Emergency Department if any problems should occur.  Please show the CHEMOTHERAPY ALERT CARD or IMMUNOTHERAPY  ALERT CARD at check-in to the Emergency Department and triage nurse.  Should you have questions after your visit or need to cancel or reschedule your appointment, please contact [facility] - A DEPT OF [facility]  [phone] and follow the prompts.  Office hours are 8:00 a.m. to 4:30 p.m. Monday - Friday. Please note that voicemails left after 4:00 p.m. may not be returned until the following business day.  We are closed weekends and major holidays. You have access to a nurse at all times for urgent questions. Please call the main number to the clinic [phone] and follow the prompts.  For any non-urgent questions, you may also contact your provider using MyChart. We now offer e-Visits for anyone 23 and older to request care online for non-urgent symptoms. For details visit [email or web address].   Also download the MyChart app! Go to the app store, search MyChart, open the app, select [facility], and log in with your MyChart username and password..  Carfilzomib  Injection What is this medication? CARFILZOMIB  (kar FILZ oh mib) treats multiple myeloma, a type of bone marrow cancer. It works by blocking a protein that causes cancer cells to grow and multiply. This helps to slow or stop the spread of cancer cells. This medicine may be used for other purposes; ask your health care provider or pharmacist if you have questions. COMMON BRAND NAME(S): KYPROLIS  What should I tell my care team before I take this medication? They need to know if you have any of these conditions: Heart disease History of blood clots Irregular  heartbeat Kidney disease Liver disease Lung or breathing disease An unusual or allergic reaction to carfilzomib , or other medications, foods, dyes, or preservatives If you or your partner are pregnant or trying to get pregnant Breastfeeding How should I use this medication? This medication is injected into a vein. It is given by your care  team in a hospital or clinic setting. Talk to your care team about the use of this medication in children. Special care may be needed. Overdosage: If you think you have taken too much of this medicine contact a poison control center or emergency room at once. NOTE: This medicine is only for you. Do not share this medicine with others. What if I miss a dose? Keep appointments for follow-up doses. It is important not to miss your dose. Call your care team if you are unable to keep an appointment. What may interact with this medication? Interactions are not expected. This list may not describe all possible interactions. Give your health care provider a list of all the medicines, herbs, non-prescription drugs, or dietary supplements you use. Also tell them if you smoke, drink alcohol, or use illegal drugs. Some items may interact with your medicine. What should I watch for while using this medication? Your condition will be monitored carefully while you are receiving this medication. You may need blood work while taking this medication. Check with your care team if you have severe diarrhea, nausea, and vomiting, or if you sweat a lot. The loss of too much body fluid may make it dangerous for you to take this medication. This medication may affect your coordination, reaction time, or judgment. Do not drive or operate machinery until you know how this medication affects you. Sit up or stand slowly to reduce the risk of dizzy or fainting spells. Drinking alcohol with this medication can increase the risk of these side effects. Talk to your care team if you may be pregnant. Serious birth defects can occur if you take this medication during pregnancy and for 6 months after the last dose. You will need a negative pregnancy test before starting this medication. Contraception is recommended while taking this medication and for 6 months after the last dose. Your care team can help you find an option that works for  you. If your partner can get pregnant, use a condom during sex while taking this medication and for 3 months after the last dose. Do not breastfeed while taking this medication and for 2 weeks after the last dose. This medication may cause infertility. Talk to your care team if you are concerned about your fertility. What side effects may I notice from receiving this medication? Side effects that you should report to your care team as soon as possible: Allergic reactions--skin rash, itching, hives, swelling of the face, lips, tongue, or throat Bleeding--bloody or black, tar-like stools, vomiting blood or brown material that looks like coffee grounds, red or dark brown urine, small red or purple spots on skin, unusual bruising or bleeding Blood clot--pain, swelling, or warmth in the leg, shortness of breath, chest pain Dizziness, loss of balance or coordination, confusion or trouble speaking Heart attack--pain or tightness in the chest, shoulders, arms, or jaw, nausea, shortness of breath, cold or clammy skin, feeling faint or lightheaded Heart failure--shortness of breath, swelling of the ankles, feet, or hands, sudden weight gain, unusual weakness or fatigue Heart rhythm changes--fast or irregular heartbeat, dizziness, feeling faint or lightheaded, chest pain, trouble breathing Increase in blood pressure Infection--fever, chills, cough,  sore throat, wounds that don't heal, pain or trouble when passing urine, general feeling of discomfort or being unwell Infusion reactions--chest pain, shortness of breath or trouble breathing, feeling faint or lightheaded Kidney injury--decrease in the amount of urine, swelling of the ankles, hands, or feet Liver injury--right upper belly pain, loss of appetite, nausea, light-colored stool, dark yellow or brown urine, yellowing skin or eyes, unusual weakness or fatigue Lung injury--shortness of breath or trouble breathing, cough, spitting up blood, chest pain,  fever Pulmonary hypertension--shortness of breath, chest pain, fast or irregular heartbeat, feeling faint or lightheaded, fatigue, swelling of the ankles or feet Stomach pain, bloody diarrhea, pale skin, unusual weakness or fatigue, decrease in the amount of urine, which may be signs of hemolytic uremic syndrome Sudden and severe headache, confusion, change in vision, seizures, which may be signs of posterior reversible encephalopathy syndrome (PRES) TTP--purple spots on the skin or inside the mouth, pale skin, yellowing skin or eyes, unusual weakness or fatigue, fever, fast or irregular heartbeat, confusion, change in vision, trouble speaking, trouble walking Tumor lysis syndrome (TLS)--nausea, vomiting, diarrhea, decrease in the amount of urine, dark urine, unusual weakness or fatigue, confusion, muscle pain or cramps, fast or irregular heartbeat, joint pain Side effects that usually do not require medical attention (report to your care team if they continue or are bothersome): Diarrhea Fatigue Nausea Trouble sleeping This list may not describe all possible side effects. Call your doctor for medical advice about side effects. You may report side effects to FDA at 1-800-FDA-1088. Where should I keep my medication? This medication is given in a hospital or clinic. It will not be stored at home. NOTE: This sheet is a summary. It may not cover all possible information. If you have questions about this medicine, talk to your doctor, pharmacist, or health care provider.  2024 [employer]/Gold Standard (2022-01-28 00:00:00)

## 2024-03-05 ENCOUNTER — Telehealth: Payer: Self-pay | Admitting: Cardiology

## 2024-03-05 NOTE — Telephone Encounter (Signed)
 Pt c/o medication issue:  1. Name of Medication: metoprolol  succinate (TOPROL -XL) 50 MG 24 hr tablet   2. How are you currently taking this medication (dosage and times per day)? As written   3. Are you having a reaction (difficulty breathing--STAT)? No   4. What is your medication issue? Pharmacy called in stating pt told them rx was changed to 1.5 twice daily. Informed her we have 1.5 once daily but will check with clinical team to clarify. Please advise.

## 2024-03-05 NOTE — Telephone Encounter (Signed)
 Reviewed chart - called pharmacy to clarify that Toprol  XL 75 mg is to only be taken once daily  Tech will call patient

## 2024-03-06 ENCOUNTER — Encounter: Payer: Self-pay | Admitting: Oncology

## 2024-03-06 MED FILL — Dexamethasone Sodium Phosphate Inj 100 MG/10ML: INTRAMUSCULAR | Qty: 2 | Status: AC

## 2024-03-07 ENCOUNTER — Encounter: Payer: Self-pay | Admitting: Oncology

## 2024-03-07 ENCOUNTER — Inpatient Hospital Stay

## 2024-03-07 ENCOUNTER — Inpatient Hospital Stay: Admitting: Oncology

## 2024-03-07 VITALS — BP 133/81 | HR 74 | Temp 97.2°F | Resp 16 | Wt 154.2 lb

## 2024-03-07 DIAGNOSIS — R634 Abnormal weight loss: Secondary | ICD-10-CM | POA: Diagnosis not present

## 2024-03-07 DIAGNOSIS — I4891 Unspecified atrial fibrillation: Secondary | ICD-10-CM

## 2024-03-07 DIAGNOSIS — T451X5A Adverse effect of antineoplastic and immunosuppressive drugs, initial encounter: Secondary | ICD-10-CM

## 2024-03-07 DIAGNOSIS — N1832 Chronic kidney disease, stage 3b: Secondary | ICD-10-CM | POA: Diagnosis not present

## 2024-03-07 DIAGNOSIS — K521 Toxic gastroenteritis and colitis: Secondary | ICD-10-CM

## 2024-03-07 DIAGNOSIS — C9000 Multiple myeloma not having achieved remission: Secondary | ICD-10-CM

## 2024-03-07 DIAGNOSIS — Z5111 Encounter for antineoplastic chemotherapy: Secondary | ICD-10-CM

## 2024-03-07 DIAGNOSIS — D6481 Anemia due to antineoplastic chemotherapy: Secondary | ICD-10-CM

## 2024-03-07 DIAGNOSIS — Z5112 Encounter for antineoplastic immunotherapy: Secondary | ICD-10-CM | POA: Diagnosis not present

## 2024-03-07 LAB — CMP (CANCER CENTER ONLY)
ALT: 95 U/L — ABNORMAL HIGH (ref 0–44)
AST: 33 U/L (ref 15–41)
Albumin: 4.1 g/dL (ref 3.5–5.0)
Alkaline Phosphatase: 105 U/L (ref 38–126)
Anion gap: 7 (ref 5–15)
BUN: 30 mg/dL — ABNORMAL HIGH (ref 8–23)
CO2: 23 mmol/L (ref 22–32)
Calcium: 8.8 mg/dL — ABNORMAL LOW (ref 8.9–10.3)
Chloride: 106 mmol/L (ref 98–111)
Creatinine: 1.11 mg/dL (ref 0.61–1.24)
GFR, Estimated: >60 mL/min (ref >60)
Glucose, Bld: 99 mg/dL (ref 70–99)
Potassium: 4.2 mmol/L (ref 3.5–5.1)
Sodium: 136 mmol/L (ref 135–145)
Total Bilirubin: 1.4 mg/dL — ABNORMAL HIGH (ref 0.0–1.2)
Total Protein: 7 g/dL (ref 6.5–8.1)

## 2024-03-07 LAB — CBC WITH DIFFERENTIAL (CANCER CENTER ONLY)
Abs Immature Granulocytes: 0.03 10*3/uL (ref 0.00–0.07)
Basophils Absolute: 0 10*3/uL (ref 0.0–0.1)
Basophils Relative: 0 %
Eosinophils Absolute: 0 10*3/uL (ref 0.0–0.5)
Eosinophils Relative: 0 %
HCT: 30 % — ABNORMAL LOW (ref 39.0–52.0)
Hemoglobin: 10.4 g/dL — ABNORMAL LOW (ref 13.0–17.0)
Immature Granulocytes: 1 %
Lymphocytes Relative: 15 %
Lymphs Abs: 0.7 10*3/uL (ref 0.7–4.0)
MCH: 35.4 pg — ABNORMAL HIGH (ref 26.0–34.0)
MCHC: 34.7 g/dL (ref 30.0–36.0)
MCV: 102 fL — ABNORMAL HIGH (ref 80.0–100.0)
Monocytes Absolute: 0.4 10*3/uL (ref 0.1–1.0)
Monocytes Relative: 9 %
Neutro Abs: 3.8 10*3/uL (ref 1.7–7.7)
Neutrophils Relative %: 75 %
Platelet Count: 116 10*3/uL — ABNORMAL LOW (ref 150–400)
RBC: 2.94 MIL/uL — ABNORMAL LOW (ref 4.22–5.81)
RDW: 14.4 % (ref 11.5–15.5)
WBC Count: 5 10*3/uL (ref 4.0–10.5)
nRBC: 0 % (ref 0.0–0.2)

## 2024-03-07 MED ORDER — SODIUM CHLORIDE 0.9 % IV SOLN
Freq: Once | INTRAVENOUS | Status: AC
Start: 2024-03-07 — End: 2024-03-07
  Filled 2024-03-07: qty 250

## 2024-03-07 MED ORDER — HEPARIN SOD (PORK) LOCK FLUSH 100 UNIT/ML IV SOLN
500.0000 [IU] | Freq: Once | INTRAVENOUS | Status: DC | PRN
Start: 2024-03-07 — End: 2024-03-07
  Filled 2024-03-07: qty 5

## 2024-03-07 MED ORDER — SODIUM CHLORIDE 0.9 % IV SOLN
20.0000 mg | Freq: Once | INTRAVENOUS | Status: AC
  Administered 2024-03-07: 20 mg via INTRAVENOUS
  Filled 2024-03-07: qty 20

## 2024-03-07 MED ORDER — SODIUM CHLORIDE 0.9 % IV SOLN
INTRAVENOUS | Status: DC
Start: 2024-03-07 — End: 2024-03-07
  Filled 2024-03-07: qty 250

## 2024-03-07 MED ORDER — DEXTROSE 5 % IV SOLN
27.0000 mg/m2 | Freq: Once | INTRAVENOUS | Status: AC
  Administered 2024-03-07: 50 mg via INTRAVENOUS
  Filled 2024-03-07: qty 15

## 2024-03-07 NOTE — Assessment & Plan Note (Signed)
Follow up with nutritionist.  

## 2024-03-07 NOTE — Assessment & Plan Note (Signed)
 Chemotherapy plan as listed above

## 2024-03-07 NOTE — Patient Instructions (Signed)
 [facility] - A DEPT OF [facility]  Discharge Instructions: Thank you for choosing [facility] to provide your oncology and hematology care.  If you have a lab appointment with the Cancer Center, please go directly to the Cancer Center and check in at the registration area.  Wear comfortable clothing and clothing appropriate for easy access to any Portacath or PICC line.   We strive to give you quality time with your provider. You may need to reschedule your appointment if you arrive late (15 or more minutes).  Arriving late affects you and other patients whose appointments are after yours.  Also, if you miss three or more appointments without notifying the office, you may be dismissed from the clinic at the provider's discretion.      For prescription refill requests, have your pharmacy contact our office and allow 72 hours for refills to be completed.    Today you received the following chemotherapy and/or immunotherapy agents Kyprolis      To help prevent nausea and vomiting after your treatment, we encourage you to take your nausea medication as directed.  BELOW ARE SYMPTOMS THAT SHOULD BE REPORTED IMMEDIATELY: *FEVER GREATER THAN 100.4 F (38 C) OR HIGHER *CHILLS OR SWEATING *NAUSEA AND VOMITING THAT IS NOT CONTROLLED WITH YOUR NAUSEA MEDICATION *UNUSUAL SHORTNESS OF BREATH *UNUSUAL BRUISING OR BLEEDING *URINARY PROBLEMS (pain or burning when urinating, or frequent urination) *BOWEL PROBLEMS (unusual diarrhea, constipation, pain near the anus) TENDERNESS IN MOUTH AND THROAT WITH OR WITHOUT PRESENCE OF ULCERS (sore throat, sores in mouth, or a toothache) UNUSUAL RASH, SWELLING OR PAIN  UNUSUAL VAGINAL DISCHARGE OR ITCHING   Items with * indicate a potential emergency and should be followed up as soon as possible or go to the Emergency Department if any problems should occur.  Please show the CHEMOTHERAPY ALERT CARD or IMMUNOTHERAPY  ALERT CARD at check-in to the Emergency Department and triage nurse.  Should you have questions after your visit or need to cancel or reschedule your appointment, please contact [facility] - A DEPT OF [facility]  [phone] and follow the prompts.  Office hours are 8:00 a.m. to 4:30 p.m. Monday - Friday. Please note that voicemails left after 4:00 p.m. may not be returned until the following business day.  We are closed weekends and major holidays. You have access to a nurse at all times for urgent questions. Please call the main number to the clinic [phone] and follow the prompts.  For any non-urgent questions, you may also contact your provider using MyChart. We now offer e-Visits for anyone 36 and older to request care online for non-urgent symptoms. For details visit [email or web address].   Also download the MyChart app! Go to the app store, search "MyChart", open the app, select , and log in with your MyChart username and password.

## 2024-03-07 NOTE — Assessment & Plan Note (Signed)
 Encourage oral hydration and avoid nephrotoxins.

## 2024-03-07 NOTE — Progress Notes (Signed)
 Hematology/Oncology Progress note [phone] [phone]         Patient Care Team: Cleotilde Oneil FALCON, MD as PCP - General (Internal Medicine) Darliss Rogue, MD as PCP - Cardiology (Cardiology) Kennyth Chew, MD as PCP - Electrophysiology (Cardiology) Babara Call, MD as Consulting Physician (Oncology)  CHIEF COMPLAINTS/REASON FOR VISIT:  Multiple myeloma   ASSESSMENT & PLAN:   Cancer Staging  Multiple myeloma [name]) Staging form: Plasma Cell Myeloma and Plasma Cell Disorders, AJCC 8th Edition - Clinical stage from 04/14/2023: RISS Stage II (Beta-2 -microglobulin (mg/L): 8.4, Albumin (g/dL): 2.6, ISS: Stage III, High-risk cytogenetics: Absent, LDH: Normal) - Signed by Babara Call, MD on 04/22/2023   Multiple myeloma (HCC) Bone marrow biopsy was reviewed. 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  Currently on 2nd line Carfilzomib  D1, 8 15 Q28 days and Selinexor  60mg  Dexamethasone  weekly. M protein responds well.  Proceed with cycle 2 D15 Carfilzomib , continue weekly Selinexor  60mg  Dexamethasone  He knows to take Dexamethasone  and  Selinexor  60mg  on D22   Prophylaxis  Continue Acyclovir , At risk of thrombosis, on Eliquis  5mg  BID for A fib Xegeva monthly if calcium  meets criteria Recommend calcium  and vitamin D  supplementation.    Weight loss Follow up with nutritionist.   Encounter for antineoplastic chemotherapy Chemotherapy plan as listed above.   CKD (chronic kidney disease) stage 3, GFR 30-59 ml/min (HCC) Encourage oral hydration and avoid nephrotoxins.    Chemotherapy induced diarrhea Manageable symptoms.  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  Stable count Monitor.   No orders of the defined types were placed in this encounter.  Follow-up per LOS  We spent sufficient time to discuss many aspect of care, questions were answered to patient's satisfaction.    Call Babara, MD, PhD [facility] Hematology Oncology 03/07/2024     HISTORY OF PRESENTING ILLNESS:  Victor Phillips is a  68 y.o.  male with PMH listed below who was referred to me for anemia  02/11/2023 - 02/14/2023 recent hospitalization due to pneumonia, respiratory failure.  He was found to have a hemoglobin decreased at 6.8, status post PRBC transfusion during admission.  EGD showed gastritis.  Colonoscopy was not remarkable. 02/11/2023 TIBC 221 ferritin 107, iron saturation 16. Patient is currently taking fusion plus Vitamin B12 level in the 300s. His echo showed grade 2 diastolic CHF.  He denies recent chest pain on exertion, shortness of breath on minimal exertion, pre-syncopal episodes, or palpitations He had not noticed any recent bleeding such as epistaxis, hematuria or hematochezia.  He denies over the counter NSAID ingestion.  Oncology History  Multiple myeloma (HCC)  04/14/2023 Initial Diagnosis   Multiple myeloma   03/13/2020 multiple myeloma panel showed M protein 4.3, IgA lambda Free lamda Level 142,-light chain ratio 0.07 04/05/2023 bone marrow biopsy showed hypercellular bone marrow with plasma cell neoplasm.The bone marrow is hypercellular for age with prominent increase in plasma cells representing 65% of all cells in the aspirate associated with interstitial infiltrates and numerous variably sized aggregates in  the clot and biopsy sections.  The plasma cells display lambda light chain restriction consistent with plasma  cell neoplasm.  Normal cytogenetics.  FISH studies pending.    04/14/2023 Cancer Staging   Staging form: Plasma Cell Myeloma and Plasma Cell  Disorders, AJCC 8th Edition - Clinical stage from 04/14/2023: RISS Stage II (Beta-2 -microglobulin (mg/L): 8.4, Albumin (g/dL): 2.6, ISS: Stage III, High-risk cytogenetics: Absent, LDH: Normal) - Signed by Babara Call, MD on 04/22/2023 Stage prefix: Initial diagnosis Beta 2 microglobulin range (mg/L): Greater than or equal to 5.5 Albumin range (g/dL): Less than 3.5 Cytogenetics: 1q addition, Other mutation   04/14/2023 Imaging   Skeletal survey 1. No lytic lesions or other intrinsic bony abnormality. 2. Borderline cardiomegaly with an interval decrease in size. 3. Diffuse atheromatous arterial calcifications including bilateral carotid artery calcifications, right greater than left   04/22/2023 - 11/11/2023 Chemotherapy   Patient is on Treatment Plan : MYELOMA NEWLY DIAGNOSED TRANSPLANT CANDIDATE DaraVRd (Daratumumab  SQ) q21d     04/25/2023 Imaging   PET scan showed  1. Two tiny hypermetabolic lucent lesions in the left posteromedial eighth and ninth ribs. No additional evidence of multiple myeloma. 2. Aortic atherosclerosis (ICD10-I70.0). Coronary artery calcification.   07/14/2023 - 07/19/2023 Hospital Admission   Hospitalized due to ileus.    07/25/2023 Bone Marrow Biopsy   A-C. Peripheral blood and bone marrow, (peripheral smear, aspirate smear, touch preparation, clot section, core biopsy):   Persistent plasma cell myeloma, 50-60% lambda restricted plasma cells on core biopsy. Hypercellular bone marrow (70%) with trilineage hematopoiesis. Negative for increased blasts. Negative for significant dysplasia. Negative for amyloid deposition.   01/25/2024 -  Chemotherapy   Patient is on Treatment Plan : MYELOMA RELAPSED/ REFRACTORY Carfilzomib  D1,8,15 (20/27) + selinexor   + Dexamethasone  (KPd) q28d       A Fib. on Eliquis  5mg  BID, follows up with cardiology.  He tolerated treatments so far. Had 2-3 episode of loose BM after taking Selinexor . He took imodium  and diarrhea resolved.  He denies  nausea vomiting, chest pain, abdominal pain,  No leg swelling, SOB.  Appetite is good. Gained weight.    MEDICAL HISTORY:  Past Medical History:  Diagnosis Date   Atrial fibrillation (HCC)    Diabetes mellitus without complication (HCC)    Hypercholesteremia    Hypertension    Multiple myeloma not having achieved remission (HCC)     SURGICAL HISTORY: Past Surgical History:  Procedure Laterality Date   CARDIOVERSION N/A 10/17/2023   Procedure: CARDIOVERSION;  Surgeon: Darliss Rogue, MD;  Location: [facility] ORS;  Service: Cardiovascular;  Laterality: N/A;   COLONOSCOPY N/A 02/13/2023   Procedure: COLONOSCOPY;  Surgeon: Toledo, Ladell POUR, MD;  Location: [facility] ENDOSCOPY;  Service: Gastroenterology;  Laterality: N/A;   COLONOSCOPY N/A 02/14/2023   Procedure: COLONOSCOPY;  Surgeon: Toledo, Ladell POUR, MD;  Location: [facility] ENDOSCOPY;  Service: Gastroenterology;  Laterality: N/A;   ESOPHAGOGASTRODUODENOSCOPY N/A 02/13/2023   Procedure: ESOPHAGOGASTRODUODENOSCOPY (EGD);  Surgeon: Toledo, Ladell POUR, MD;  Location: [facility] ENDOSCOPY;  Service: Gastroenterology;  Laterality: N/A;   IR BONE MARROW BIOPSY & ASPIRATION  11/24/2023    SOCIAL HISTORY: Social History   Socioeconomic History   Marital status: Married    Spouse name: Not on file   Number of children: Not on file   Years of education: Not on file   Highest education level: Not on file  Occupational History   Not on file  Tobacco Use   Smoking status: Never   Smokeless tobacco: Never  Vaping Use   Vaping status: Never Used  Substance and Sexual Activity   Alcohol use: Not Currently  Comment: quit approx 2001   Drug use: No   Sexual activity: Not on file  Other Topics Concern   Not on file  Social History Narrative   Not on file   Social Drivers of Health   Financial Resource Strain: Medium Risk (01/13/2024)   Overall Financial Resource Strain (CARDIA)    Difficulty of Paying Living Expenses: Somewhat hard  Food Insecurity:  Food Insecurity Present (01/13/2024)   Hunger Vital Sign    Worried About Running Out of Food in the Last Year: Sometimes true    Ran Out of Food in the Last Year: Sometimes true  Transportation Needs: No Transportation Needs (01/13/2024)   PRAPARE - Administrator, Civil Service (Medical): No    Lack of Transportation (Non-Medical): No  Physical Activity: Insufficiently Active (10/12/2023)   Received from [facility]   Exercise Vital Sign    On average, how many days per week do you engage in moderate to strenuous exercise (like a brisk walk)?: 7 days    On average, how many minutes do you engage in exercise at this level?: 10 min  Stress: No Stress Concern Present (10/12/2023)   Received from [facility] - Occupational Stress Questionnaire    Feeling of Stress : Not at all  Recent Concern: Stress - Stress Concern Present (10/04/2023)   Received from [facility] Occupational Health - Occupational Stress Questionnaire    Feeling of Stress : Rather much  Social Connections: Moderately Integrated (10/12/2023)   Received from [facility]   Social Connection and Isolation Panel    In a typical week, how many times do you talk on the phone with family, friends, or neighbors?: More than three times a week    How often do you get together with friends or relatives?: More than three times a week    How often do you attend church or religious services?: 1 to 4 times per year    Do you belong to any clubs or organizations such as church groups, unions, fraternal or athletic groups, or school groups?: No    How often do you attend meetings of the clubs or organizations you belong to?: Never    Are you married, widowed, divorced, separated, never married, or living with a partner?: Married  Intimate Partner Violence: Not At Risk (07/14/2023)   Humiliation,  Afraid, Rape, and Kick questionnaire    Fear of Current or Ex-Partner: No    Emotionally Abused: No    Physically Abused: No    Sexually Abused: No    FAMILY HISTORY: Family History  Problem Relation Age of Onset   Diabetes Mother    Heart attack Father     ALLERGIES:  has no known allergies.  MEDICATIONS:  Current Outpatient Medications  Medication Sig Dispense Refill   acetaminophen  (TYLENOL ) 325 MG tablet Take 2 tablets (650 mg total) by mouth every 6 (six) hours as needed for mild pain (or Fever >/= 101). 90 tablet 1   acyclovir  (ZOVIRAX ) 400 MG tablet Take 1 tablet (400 mg total) by mouth 2 (two) times daily. 180 tablet 0   apixaban  (ELIQUIS ) 5 MG TABS tablet Take 1 tablet (5 mg total) by mouth 2 (two) times daily. 60 tablet 3   atorvastatin  (LIPITOR) 10 MG tablet Take 10 mg by mouth daily.     calcium  carbonate (OS-CAL) 600 MG TABS tablet  Take 2 tablets (1,200 mg total) by mouth daily.     cholecalciferol (VITAMIN D3) 25 MCG (1000 UNIT) tablet Take 1 tablet (1,000 Units total) by mouth daily.     dexamethasone  (DECADRON ) 4 MG tablet Take 5 tablets (20 mg) for 1 day on the week after chemotherapy (day 22). 5 tablet 11   glipiZIDE (GLUCOTROL XL) 2.5 MG 24 hr tablet Take 5 mg by mouth daily with breakfast.     loperamide  (IMODIUM ) 2 MG capsule Take 1 capsule (2 mg total) by mouth See admin instructions. With onset of diarrhea, take 4 mg,then 2 mg every 2 hours or after every loose bowel movements  maximum: 16 mg/day 60 capsule 2   metoprolol  succinate (TOPROL -XL) 50 MG 24 hr tablet Take 1.5 tablets (75 mg total) by mouth daily. Take with or immediately following a meal. 135 tablet 3   Multiple Vitamin (MULTIVITAMIN WITH MINERALS) TABS tablet Take 1 tablet by mouth daily. 90 tablet 1   ondansetron  (ZOFRAN ) 8 MG tablet Take 1 tablet (8 mg total) by mouth every 8 (eight) hours as needed for nausea or vomiting. 90 tablet 1   pantoprazole  (PROTONIX ) 40 MG tablet Take 1 tablet (40 mg  total) by mouth daily. 30 tablet 1   prochlorperazine  (COMPAZINE ) 10 MG tablet Take 1 tablet (10 mg total) by mouth every 6 (six) hours as needed for nausea or vomiting. 90 tablet 1   selinexor  (XPOVIO , 60 MG ONCE WEEKLY,) Therapy Pack (60 mg once weekly) Take 1 tablet (60 mg total) by mouth once a week. 4 tablet 4   temazepam (RESTORIL) 15 MG capsule Take 15 mg by mouth at bedtime.     No current facility-administered medications for this visit.   Facility-Administered Medications Ordered in Other Visits  Medication Dose Route Frequency Provider Last Rate Last Admin   0.9 %  sodium chloride  infusion   Intravenous Continuous Babara Call, MD 10 mL/hr at 03/07/24 0948 New Bag at 03/07/24 0948   carfilzomib  (KYPROLIS ) 50 mg in dextrose  5 % 50 mL chemo infusion  27 mg/m2 (Treatment Plan Recorded) Intravenous Once Babara Call, MD 150 mL/hr at 03/07/24 1023 50 mg at 03/07/24 1023   heparin  lock flush 100 unit/mL  500 Units Intracatheter Once PRN Babara Call, MD        Review of Systems  Constitutional:  Positive for fatigue. Negative for appetite change, chills, fever and unexpected weight change.  HENT:   Negative for hearing loss and voice change.   Eyes:  Negative for eye problems and icterus.  Respiratory:  Negative for chest tightness, cough and shortness of breath.   Cardiovascular:  Negative for chest pain and leg swelling.  Gastrointestinal:  Negative for abdominal distention, abdominal pain and diarrhea.  Endocrine: Negative for hot flashes.  Genitourinary:  Negative for difficulty urinating, dysuria and frequency.   Musculoskeletal:  Negative for arthralgias.  Skin:  Negative for itching and rash.  Neurological:  Negative for light-headedness and numbness.  Hematological:  Negative for adenopathy. Does not bruise/bleed easily.  Psychiatric/Behavioral:  Positive for sleep disturbance. Negative for confusion.     PHYSICAL EXAMINATION: Vitals:   03/07/24 0844  BP: 133/81  Pulse: 74  Resp:  16  Temp: (!) 97.2 F (36.2 C)  SpO2: 100%   Filed Weights   03/07/24 0844  Weight: 154 lb 3.2 oz (69.9 kg)    Physical Exam Constitutional:      General: He is not in acute distress.    Appearance: He  is obese.  HENT:     Head: Normocephalic and atraumatic.   Eyes:     General: No scleral icterus.   Cardiovascular:     Rate and Rhythm: Normal rate and regular rhythm.  Pulmonary:     Effort: Pulmonary effort is normal. No respiratory distress.     Breath sounds: No wheezing.  Abdominal:     General: Bowel sounds are normal. There is no distension.     Palpations: Abdomen is soft.   Musculoskeletal:        General: No deformity. Normal range of motion.     Cervical back: Normal range of motion and neck supple.     Right lower leg: No edema.     Left lower leg: No edema.   Skin:    General: Skin is warm and dry.     Findings: No erythema or rash.   Neurological:     Mental Status: He is alert and oriented to person, place, and time. Mental status is at baseline.     Cranial Nerves: No cranial nerve deficit.   Psychiatric:        Mood and Affect: Mood normal.      LABORATORY DATA:  I have reviewed the data as listed    Latest Ref Rng & Units 03/07/2024    8:34 AM 02/29/2024   12:40 PM 02/22/2024    8:25 AM  CBC  WBC 4.0 - 10.5 K/uL 5.0  8.8  5.7   Hemoglobin 13.0 - 17.0 g/dL 89.5  88.4  87.8   Hematocrit 39.0 - 52.0 % 30.0  33.0  34.5   Platelets 150 - 400 K/uL 116  117  152       Latest Ref Rng & Units 03/07/2024    8:34 AM 02/29/2024   12:39 PM 02/22/2024    8:26 AM  CMP  Glucose 70 - 99 mg/dL 99  856  794   BUN 8 - 23 mg/dL 30  30  27    Creatinine 0.61 - 1.24 mg/dL 8.88  9.07  9.02   Sodium 135 - 145 mmol/L 136  138  133   Potassium 3.5 - 5.1 mmol/L 4.2  4.5  4.8   Chloride 98 - 111 mmol/L 106  108  101   CO2 22 - 32 mmol/L 23  22  25    Calcium  8.9 - 10.3 mg/dL 8.8  8.8  9.1   Total Protein 6.5 - 8.1 g/dL 7.0  7.3  7.6   Total Bilirubin 0.0 -  1.2 mg/dL 1.4  0.9  0.8   Alkaline Phos 38 - 126 U/L 105  105  94   AST 15 - 41 U/L 33  27  31   ALT 0 - 44 U/L 95  74  99    Lab Results  Component Value Date   IRON 100 03/14/2023   TIBC 272 03/14/2023   IRONPCTSAT 37 03/14/2023   FERRITIN 249 03/14/2023     RADIOGRAPHIC STUDIES: I have personally reviewed the radiological images as listed and agreed with the findings in the report. No results found.

## 2024-03-07 NOTE — Assessment & Plan Note (Signed)
 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  Stable count Monitor.

## 2024-03-07 NOTE — Assessment & Plan Note (Signed)
 Manageable symptoms.  Recommend imodium  PRN

## 2024-03-07 NOTE — Assessment & Plan Note (Signed)
 Echo was done on 06/14/23 LVEF 50% Continue Eliquis  5mg  BID

## 2024-03-07 NOTE — Assessment & Plan Note (Addendum)
 Bone marrow biopsy was reviewed. 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  Currently on 2nd line Carfilzomib  D1, 8 15 Q28 days and Selinexor  60mg  Dexamethasone  weekly. M protein responds well.  Proceed with cycle 2 D15 Carfilzomib , continue weekly Selinexor  60mg  Dexamethasone  He knows to take Dexamethasone  and  Selinexor  60mg  on D22   Prophylaxis  Continue Acyclovir , At risk of thrombosis, on Eliquis  5mg  BID for A fib Xegeva monthly if calcium  meets criteria Recommend calcium  and vitamin D  supplementation.

## 2024-03-07 NOTE — Progress Notes (Signed)
 Nutrition Follow-up:  Patient with multiple myeloma.  Receiving carfilzomib , second line therapy.    Met with patient and wife during infusion.  Reports that he has been eating 3 meals a day and bedtime snack.  Has been drinking 1 glucerna shake a day.  Denies nutrition impact symptoms.     Medications: reviewed  Labs: reviewed  Anthropometrics:   Weight 154 lb 3.2 oz today  152 lb on 6/11 162 lb on 10/13/23 181 lb on 08/29/23   NUTRITION DIAGNOSIS: Unintentional weight loss improving   INTERVENTION:  Continue glucerna/equivalent shake at least daily for added nutrition Eat 3 meals a day plus bedtime snack to help maintain weight    MONITORING, EVALUATION, GOAL: weight trends, intake   NEXT VISIT: Wed, July 23rd during infusion  Maurine Mowbray B. Dasie SOLON, CSO, LDN Registered Dietitian [phone]

## 2024-03-08 ENCOUNTER — Other Ambulatory Visit: Payer: Self-pay

## 2024-03-12 ENCOUNTER — Other Ambulatory Visit: Payer: Self-pay

## 2024-03-12 ENCOUNTER — Other Ambulatory Visit: Payer: Self-pay | Admitting: Cardiology

## 2024-03-12 MED ORDER — APIXABAN 5 MG PO TABS
5.0000 mg | ORAL_TABLET | Freq: Two times a day (BID) | ORAL | 5 refills | Status: AC
  Filled 2024-03-12: qty 60, 30d supply, fill #0
  Filled 2024-04-20: qty 60, 30d supply, fill #1
  Filled 2024-05-25: qty 60, 30d supply, fill #2
  Filled 2024-07-03: qty 60, 30d supply, fill #3
  Filled 2024-08-02: qty 60, 30d supply, fill #4

## 2024-03-12 NOTE — Telephone Encounter (Signed)
 Prescription refill request for Eliquis  received. Indication: AF Last office visit: 01/20/24  B Agbor-Etang MD Scr: 1.11 on 03/07/24  Epic Age: 68 Weight: 72.7kg  Based on above findings Eliquis  5mg  twice daily is the appropriate dose.  Refill approved.

## 2024-03-15 ENCOUNTER — Encounter: Payer: Self-pay | Admitting: Oncology

## 2024-03-15 ENCOUNTER — Other Ambulatory Visit: Payer: Self-pay

## 2024-03-15 ENCOUNTER — Inpatient Hospital Stay: Attending: Oncology

## 2024-03-15 ENCOUNTER — Other Ambulatory Visit: Payer: Self-pay | Admitting: Oncology

## 2024-03-15 VITALS — BP 119/67 | HR 94 | Temp 96.1°F | Resp 17 | Wt 158.3 lb

## 2024-03-15 DIAGNOSIS — I4891 Unspecified atrial fibrillation: Secondary | ICD-10-CM | POA: Diagnosis not present

## 2024-03-15 DIAGNOSIS — D6481 Anemia due to antineoplastic chemotherapy: Secondary | ICD-10-CM | POA: Insufficient documentation

## 2024-03-15 DIAGNOSIS — R42 Dizziness and giddiness: Secondary | ICD-10-CM | POA: Diagnosis not present

## 2024-03-15 DIAGNOSIS — N183 Chronic kidney disease, stage 3 unspecified: Secondary | ICD-10-CM | POA: Diagnosis not present

## 2024-03-15 DIAGNOSIS — T451X5A Adverse effect of antineoplastic and immunosuppressive drugs, initial encounter: Secondary | ICD-10-CM | POA: Insufficient documentation

## 2024-03-15 DIAGNOSIS — Z79899 Other long term (current) drug therapy: Secondary | ICD-10-CM | POA: Insufficient documentation

## 2024-03-15 DIAGNOSIS — Z5112 Encounter for antineoplastic immunotherapy: Secondary | ICD-10-CM | POA: Insufficient documentation

## 2024-03-15 DIAGNOSIS — C9000 Multiple myeloma not having achieved remission: Secondary | ICD-10-CM | POA: Insufficient documentation

## 2024-03-15 MED ORDER — DIPHENHYDRAMINE HCL 25 MG PO CAPS
25.0000 mg | ORAL_CAPSULE | Freq: Four times a day (QID) | ORAL | Status: DC | PRN
  Administered 2024-03-15: 25 mg via ORAL
  Filled 2024-03-15: qty 1

## 2024-03-15 MED ORDER — DEXTROSE 5 % IV SOLN
INTRAVENOUS | Status: DC
  Filled 2024-03-15 (×2): qty 250

## 2024-03-15 MED ORDER — IMMUNE GLOBULIN (HUMAN) 20 GM/200ML IV SOLN
1.0000 g/kg | INTRAVENOUS | Status: DC | PRN
  Administered 2024-03-15: 65 g via INTRAVENOUS
  Filled 2024-03-15: qty 50

## 2024-03-15 MED ORDER — ACETAMINOPHEN 325 MG PO TABS
650.0000 mg | ORAL_TABLET | Freq: Four times a day (QID) | ORAL | Status: DC | PRN
  Administered 2024-03-15: 650 mg via ORAL
  Filled 2024-03-15: qty 2

## 2024-03-20 MED FILL — Dexamethasone Sodium Phosphate Inj 100 MG/10ML: INTRAMUSCULAR | Qty: 2 | Status: AC

## 2024-03-21 ENCOUNTER — Encounter

## 2024-03-21 ENCOUNTER — Inpatient Hospital Stay

## 2024-03-21 ENCOUNTER — Inpatient Hospital Stay: Admitting: Oncology

## 2024-03-21 ENCOUNTER — Encounter: Payer: Self-pay | Admitting: Oncology

## 2024-03-21 VITALS — BP 123/89 | HR 93 | Temp 97.2°F | Resp 16 | Ht 67.0 in | Wt 157.0 lb

## 2024-03-21 VITALS — BP 109/68 | HR 98 | Temp 97.0°F | Resp 17

## 2024-03-21 DIAGNOSIS — N1832 Chronic kidney disease, stage 3b: Secondary | ICD-10-CM | POA: Diagnosis not present

## 2024-03-21 DIAGNOSIS — I4891 Unspecified atrial fibrillation: Secondary | ICD-10-CM | POA: Diagnosis not present

## 2024-03-21 DIAGNOSIS — T451X5A Adverse effect of antineoplastic and immunosuppressive drugs, initial encounter: Secondary | ICD-10-CM

## 2024-03-21 DIAGNOSIS — Z5111 Encounter for antineoplastic chemotherapy: Secondary | ICD-10-CM

## 2024-03-21 DIAGNOSIS — D6481 Anemia due to antineoplastic chemotherapy: Secondary | ICD-10-CM

## 2024-03-21 DIAGNOSIS — C9000 Multiple myeloma not having achieved remission: Secondary | ICD-10-CM

## 2024-03-21 DIAGNOSIS — Z5112 Encounter for antineoplastic immunotherapy: Secondary | ICD-10-CM | POA: Diagnosis not present

## 2024-03-21 LAB — CBC WITH DIFFERENTIAL (CANCER CENTER ONLY)
Abs Immature Granulocytes: 0.03 K/uL (ref 0.00–0.07)
Basophils Absolute: 0 K/uL (ref 0.0–0.1)
Basophils Relative: 0 %
Eosinophils Absolute: 0 K/uL (ref 0.0–0.5)
Eosinophils Relative: 0 %
HCT: 27.6 % — ABNORMAL LOW (ref 39.0–52.0)
Hemoglobin: 9.2 g/dL — ABNORMAL LOW (ref 13.0–17.0)
Immature Granulocytes: 1 %
Lymphocytes Relative: 17 %
Lymphs Abs: 0.8 K/uL (ref 0.7–4.0)
MCH: 35.9 pg — ABNORMAL HIGH (ref 26.0–34.0)
MCHC: 33.3 g/dL (ref 30.0–36.0)
MCV: 107.8 fL — ABNORMAL HIGH (ref 80.0–100.0)
Monocytes Absolute: 0.4 K/uL (ref 0.1–1.0)
Monocytes Relative: 8 %
Neutro Abs: 3.5 K/uL (ref 1.7–7.7)
Neutrophils Relative %: 74 %
Platelet Count: 110 K/uL — ABNORMAL LOW (ref 150–400)
RBC: 2.56 MIL/uL — ABNORMAL LOW (ref 4.22–5.81)
RDW: 15.2 % (ref 11.5–15.5)
WBC Count: 4.8 K/uL (ref 4.0–10.5)
nRBC: 0 % (ref 0.0–0.2)

## 2024-03-21 LAB — CMP (CANCER CENTER ONLY)
ALT: 63 U/L — ABNORMAL HIGH (ref 0–44)
AST: 22 U/L (ref 15–41)
Albumin: 3.9 g/dL (ref 3.5–5.0)
Alkaline Phosphatase: 98 U/L (ref 38–126)
Anion gap: 6 (ref 5–15)
BUN: 19 mg/dL (ref 8–23)
CO2: 25 mmol/L (ref 22–32)
Calcium: 8.6 mg/dL — ABNORMAL LOW (ref 8.9–10.3)
Chloride: 106 mmol/L (ref 98–111)
Creatinine: 1.19 mg/dL (ref 0.61–1.24)
GFR, Estimated: >60 mL/min (ref >60)
Glucose, Bld: 121 mg/dL — ABNORMAL HIGH (ref 70–99)
Potassium: 4 mmol/L (ref 3.5–5.1)
Sodium: 137 mmol/L (ref 135–145)
Total Bilirubin: 1.4 mg/dL — ABNORMAL HIGH (ref 0.0–1.2)
Total Protein: 7.4 g/dL (ref 6.5–8.1)

## 2024-03-21 MED ORDER — DEXTROSE 5 % IV SOLN
27.0000 mg/m2 | Freq: Once | INTRAVENOUS | Status: AC
  Administered 2024-03-21: 50 mg via INTRAVENOUS
  Filled 2024-03-21: qty 15

## 2024-03-21 MED ORDER — VITAMIN B-12 1000 MCG PO TABS: 1000.0000 ug | ORAL_TABLET | Freq: Every day | ORAL | Status: AC

## 2024-03-21 MED ORDER — SODIUM CHLORIDE 0.9 % IV SOLN
INTRAVENOUS | Status: DC
  Filled 2024-03-21: qty 250

## 2024-03-21 MED ORDER — SODIUM CHLORIDE 0.9 % IV SOLN
20.0000 mg | Freq: Once | INTRAVENOUS | Status: AC
  Administered 2024-03-21: 20 mg via INTRAVENOUS
  Filled 2024-03-21: qty 20

## 2024-03-21 NOTE — Assessment & Plan Note (Signed)
 Encourage oral hydration and avoid nephrotoxins.

## 2024-03-21 NOTE — Assessment & Plan Note (Signed)
 Chemotherapy plan as listed above

## 2024-03-21 NOTE — Assessment & Plan Note (Signed)
 Echo was done on 06/14/23 LVEF 50% Continue Eliquis  5mg  BID

## 2024-03-21 NOTE — Assessment & Plan Note (Addendum)
 Bone marrow biopsy was reviewed. 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  Currently on 2nd line Carfilzomib  D1, 8 15 Q28 days and Selinexor  60mg  Dexamethasone  weekly. M protein responds well.  Proceed with cycle 3 D1 Carfilzomib , continue weekly Selinexor  60mg  Dexamethasone    Prophylaxis  Continue Acyclovir , At risk of thrombosis, on Eliquis  5mg  BID for A fib Xegeva monthly if calcium  meets criteria - hold due to hypocalcemia Recommend calcium  and vitamin D  supplementation.

## 2024-03-21 NOTE — Progress Notes (Signed)
 Hematology/Oncology Progress note [phone] [phone]         Patient Care Team: Cleotilde Oneil FALCON, MD as PCP - General (Internal Medicine) Darliss Rogue, MD as PCP - Cardiology (Cardiology) Kennyth Chew, MD as PCP - Electrophysiology (Cardiology) Babara Call, MD as Consulting Physician (Oncology)  CHIEF COMPLAINTS/REASON FOR VISIT:  Multiple myeloma   ASSESSMENT & PLAN:   Cancer Staging  Multiple myeloma [facility]) Staging form: Plasma Cell Myeloma and Plasma Cell Disorders, AJCC 8th Edition - Clinical stage from 04/14/2023: RISS Stage II (Beta-2 -microglobulin (mg/L): 8.4, Albumin (g/dL): 2.6, ISS: Stage III, High-risk cytogenetics: Absent, LDH: Normal) - Signed by Babara Call, MD on 04/22/2023   Multiple myeloma (HCC) Bone marrow biopsy was reviewed. 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  Currently on 2nd line Carfilzomib  D1, 8 15 Q28 days and Selinexor  60mg  Dexamethasone  weekly. M protein responds well.  Proceed with cycle 3 D1 Carfilzomib , continue weekly Selinexor  60mg  Dexamethasone    Prophylaxis  Continue Acyclovir , At risk of thrombosis, on Eliquis  5mg  BID for A fib Xegeva monthly if calcium  meets criteria - hold due to hypocalcemia Recommend calcium  and vitamin D  supplementation.    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  Stable count Monitor.   Atrial fibrillation (HCC) Echo was done on 06/14/23 LVEF 50% Continue Eliquis  5mg  BID    CKD (chronic kidney disease) stage 3, GFR 30-59 ml/min (HCC) Encourage oral hydration and avoid nephrotoxins.    Encounter for antineoplastic chemotherapy Chemotherapy plan as listed above.   Orders Placed This Encounter   Procedures   Vitamin B12    Standing Status:   Future    Expected Date:   03/28/2024    Expiration Date:   03/28/2025   Folate    Standing Status:   Future    Expected Date:   03/28/2024    Expiration Date:   03/28/2025   Iron and TIBC    Standing Status:   Future    Expected Date:   03/28/2024    Expiration Date:   03/28/2025   Ferritin    Standing Status:   Future    Expected Date:   03/28/2024    Expiration Date:   03/28/2025   Follow-up per LOS  We spent sufficient time to discuss many aspect of care, questions were answered to patient's satisfaction.    Call Babara, MD, PhD [facility] Hematology Oncology 03/21/2024     HISTORY OF PRESENTING ILLNESS:  Victor Phillips is a  68 y.o.  male with PMH listed below who was referred to me for anemia  02/11/2023 - 02/14/2023 recent hospitalization due to pneumonia, respiratory failure.  He was found to have a hemoglobin decreased at 6.8, status post PRBC transfusion during admission.  EGD showed gastritis.  Colonoscopy was not remarkable. 02/11/2023 TIBC 221 ferritin 107, iron saturation 16. Patient is currently taking fusion plus Vitamin B12 level in the 300s. His echo showed grade 2 diastolic CHF.  He denies recent chest pain on exertion, shortness of breath on minimal exertion, pre-syncopal episodes, or palpitations He had not noticed any recent bleeding such as epistaxis, hematuria or hematochezia.  He denies over the counter NSAID ingestion.  Oncology History  Multiple myeloma (HCC)  04/14/2023 Initial Diagnosis  Multiple myeloma   03/13/2020 multiple myeloma panel showed M protein 4.3, IgA lambda Free lamda Level 142,-light chain ratio 0.07 04/05/2023 bone marrow biopsy showed hypercellular bone marrow with plasma cell neoplasm.The bone marrow is hypercellular for age with prominent increase in plasma cells representing 65% of all cells in the aspirate associated with interstitial infiltrates and numerous variably sized aggregates in   the clot and biopsy sections.  The plasma cells display lambda light chain restriction consistent with plasma cell neoplasm.  Normal cytogenetics.  FISH studies pending.    04/14/2023 Cancer Staging   Staging form: Plasma Cell Myeloma and Plasma Cell Disorders, AJCC 8th Edition - Clinical stage from 04/14/2023: RISS Stage II (Beta-2 -microglobulin (mg/L): 8.4, Albumin (g/dL): 2.6, ISS: Stage III, High-risk cytogenetics: Absent, LDH: Normal) - Signed by Babara Call, MD on 04/22/2023 Stage prefix: Initial diagnosis Beta 2 microglobulin range (mg/L): Greater than or equal to 5.5 Albumin range (g/dL): Less than 3.5 Cytogenetics: 1q addition, Other mutation   04/14/2023 Imaging   Skeletal survey 1. No lytic lesions or other intrinsic bony abnormality. 2. Borderline cardiomegaly with an interval decrease in size. 3. Diffuse atheromatous arterial calcifications including bilateral carotid artery calcifications, right greater than left   04/22/2023 - 11/11/2023 Chemotherapy   Patient is on Treatment Plan : MYELOMA NEWLY DIAGNOSED TRANSPLANT CANDIDATE DaraVRd (Daratumumab  SQ) q21d     04/25/2023 Imaging   PET scan showed  1. Two tiny hypermetabolic lucent lesions in the left posteromedial eighth and ninth ribs. No additional evidence of multiple myeloma. 2. Aortic atherosclerosis (ICD10-I70.0). Coronary artery calcification.   07/14/2023 - 07/19/2023 Hospital Admission   Hospitalized due to ileus.    07/25/2023 Bone Marrow Biopsy   A-C. Peripheral blood and bone marrow, (peripheral smear, aspirate smear, touch preparation, clot section, core biopsy):   Persistent plasma cell myeloma, 50-60% lambda restricted plasma cells on core biopsy. Hypercellular bone marrow (70%) with trilineage hematopoiesis. Negative for increased blasts. Negative for significant dysplasia. Negative for amyloid deposition.   01/25/2024 -  Chemotherapy   Patient is on Treatment Plan : MYELOMA RELAPSED/ REFRACTORY Carfilzomib   D1,8,15 (20/27) + selinexor   + Dexamethasone  (KPd) q28d       A Fib. on Eliquis  5mg  BID, follows up with cardiology.  He tolerated treatments so far. Had 2-3 episode of loose BM after taking Selinexor . He took imodium  and diarrhea resolved.  He denies nausea vomiting, chest pain, abdominal pain,  No leg swelling, SOB.  Appetite is good. Gained weight.    MEDICAL HISTORY:  Past Medical History:  Diagnosis Date   Atrial fibrillation (HCC)    Diabetes mellitus without complication (HCC)    Hypercholesteremia    Hypertension    Multiple myeloma not having achieved remission (HCC)     SURGICAL HISTORY: Past Surgical History:  Procedure Laterality Date   CARDIOVERSION N/A 10/17/2023   Procedure: CARDIOVERSION;  Surgeon: Darliss Rogue, MD;  Location: [facility] ORS;  Service: Cardiovascular;  Laterality: N/A;   COLONOSCOPY N/A 02/13/2023   Procedure: COLONOSCOPY;  Surgeon: Toledo, Ladell POUR, MD;  Location: [facility] ENDOSCOPY;  Service: Gastroenterology;  Laterality: N/A;   COLONOSCOPY N/A 02/14/2023   Procedure: COLONOSCOPY;  Surgeon: Toledo, Ladell POUR, MD;  Location: [facility] ENDOSCOPY;  Service: Gastroenterology;  Laterality: N/A;   ESOPHAGOGASTRODUODENOSCOPY N/A 02/13/2023   Procedure: ESOPHAGOGASTRODUODENOSCOPY (EGD);  Surgeon: Toledo, Ladell POUR, MD;  Location: [facility] ENDOSCOPY;  Service: Gastroenterology;  Laterality: N/A;   IR BONE MARROW BIOPSY & ASPIRATION  11/24/2023    SOCIAL HISTORY: Social History  Socioeconomic History   Marital status: Married    Spouse name: Not on file   Number of children: Not on file   Years of education: Not on file   Highest education level: Not on file  Occupational History   Not on file  Tobacco Use   Smoking status: Never   Smokeless tobacco: Never  Vaping Use   Vaping status: Never Used  Substance and Sexual Activity   Alcohol use: Not Currently    Comment: quit approx 2001   Drug use: No   Sexual activity: Not on file  Other Topics Concern    Not on file  Social History Narrative   Not on file   Social Drivers of Health   Financial Resource Strain: Medium Risk (01/13/2024)   Overall Financial Resource Strain (CARDIA)    Difficulty of Paying Living Expenses: Somewhat hard  Food Insecurity: Food Insecurity Present (01/13/2024)   Hunger Vital Sign    Worried About Running Out of Food in the Last Year: Sometimes true    Ran Out of Food in the Last Year: Sometimes true  Transportation Needs: No Transportation Needs (01/13/2024)   PRAPARE - Administrator, Civil Service (Medical): No    Lack of Transportation (Non-Medical): No  Physical Activity: Insufficiently Active (10/12/2023)   Received from [facility]   Exercise Vital Sign    On average, how many days per week do you engage in moderate to strenuous exercise (like a brisk walk)?: 7 days    On average, how many minutes do you engage in exercise at this level?: 10 min  Stress: No Stress Concern Present (10/12/2023)   Received from [facility] - Occupational Stress Questionnaire    Feeling of Stress : Not at all  Recent Concern: Stress - Stress Concern Present (10/04/2023)   Received from [facility] - Occupational Stress Questionnaire    Feeling of Stress : Rather much  Social Connections: Moderately Integrated (10/12/2023)   Received from [facility]   Social Connection and Isolation Panel    In a typical week, how many times do you talk on the phone with family, friends, or neighbors?: More than three times a week    How often do you get together with friends or relatives?: More than three times a week    How often do you attend church or religious services?: 1 to 4 times per year    Do you belong to any clubs or organizations such as church groups, unions, fraternal or athletic groups, or school groups?: No     How often do you attend meetings of the clubs or organizations you belong to?: Never    Are you married, widowed, divorced, separated, never married, or living with a partner?: Married  Intimate Partner Violence: Not At Risk (07/14/2023)   Humiliation, Afraid, Rape, and Kick questionnaire    Fear of Current or Ex-Partner: No    Emotionally Abused: No    Physically Abused: No    Sexually Abused: No    FAMILY HISTORY: Family History  Problem Relation Age of Onset   Diabetes Mother    Heart attack Father     ALLERGIES:  has no known allergies.  MEDICATIONS:  Current Outpatient Medications  Medication Sig Dispense Refill   acetaminophen  (TYLENOL ) 325 MG tablet Take 2 tablets (650 mg total) by mouth every 6 (  six) hours as needed for mild pain (or Fever >/= 101). 90 tablet 1   acyclovir  (ZOVIRAX ) 400 MG tablet Take 1 tablet (400 mg total) by mouth 2 (two) times daily. 180 tablet 0   apixaban  (ELIQUIS ) 5 MG TABS tablet Take 1 tablet (5 mg total) by mouth 2 (two) times daily. 60 tablet 5   atorvastatin  (LIPITOR) 10 MG tablet Take 10 mg by mouth daily.     calcium  carbonate (OS-CAL) 600 MG TABS tablet Take 2 tablets (1,200 mg total) by mouth daily.     cholecalciferol (VITAMIN D3) 25 MCG (1000 UNIT) tablet Take 1 tablet (1,000 Units total) by mouth daily.     cyanocobalamin  (VITAMIN B12) 1000 MCG tablet Take 1 tablet (1,000 mcg total) by mouth daily.     dexamethasone  (DECADRON ) 4 MG tablet Take 5 tablets (20 mg) for 1 day on the week after chemotherapy (day 22). 5 tablet 11   glipiZIDE (GLUCOTROL XL) 2.5 MG 24 hr tablet Take 5 mg by mouth daily with breakfast.     loperamide  (IMODIUM ) 2 MG capsule Take 1 capsule (2 mg total) by mouth See admin instructions. With onset of diarrhea, take 4 mg,then 2 mg every 2 hours or after every loose bowel movements  maximum: 16 mg/day 60 capsule 2   metoprolol  succinate (TOPROL -XL) 50 MG 24 hr tablet Take 1.5 tablets (75 mg total) by mouth daily. Take with  or immediately following a meal. 135 tablet 3   Multiple Vitamin (MULTIVITAMIN WITH MINERALS) TABS tablet Take 1 tablet by mouth daily. 90 tablet 1   ondansetron  (ZOFRAN ) 8 MG tablet Take 1 tablet (8 mg total) by mouth every 8 (eight) hours as needed for nausea or vomiting. 90 tablet 1   pantoprazole  (PROTONIX ) 40 MG tablet Take 1 tablet (40 mg total) by mouth daily. 30 tablet 1   prochlorperazine  (COMPAZINE ) 10 MG tablet Take 1 tablet (10 mg total) by mouth every 6 (six) hours as needed for nausea or vomiting. 90 tablet 1   selinexor  (XPOVIO , 60 MG ONCE WEEKLY,) Therapy Pack (60 mg once weekly) Take 1 tablet (60 mg total) by mouth once a week. 4 tablet 4   temazepam (RESTORIL) 15 MG capsule Take 15 mg by mouth at bedtime.     No current facility-administered medications for this visit.    Review of Systems  Constitutional:  Positive for fatigue. Negative for appetite change, chills, fever and unexpected weight change.  HENT:   Negative for hearing loss and voice change.   Eyes:  Negative for eye problems and icterus.  Respiratory:  Negative for chest tightness, cough and shortness of breath.   Cardiovascular:  Negative for chest pain and leg swelling.  Gastrointestinal:  Negative for abdominal distention, abdominal pain and diarrhea.  Endocrine: Negative for hot flashes.  Genitourinary:  Negative for difficulty urinating, dysuria and frequency.   Musculoskeletal:  Negative for arthralgias.  Skin:  Negative for itching and rash.  Neurological:  Negative for light-headedness and numbness.  Hematological:  Negative for adenopathy. Does not bruise/bleed easily.  Psychiatric/Behavioral:  Positive for sleep disturbance. Negative for confusion.     PHYSICAL EXAMINATION: Vitals:   03/21/24 1259  BP: 123/89  Pulse: 93  Resp: 16  Temp: (!) 97.2 F (36.2 C)  SpO2: 100%   Filed Weights   03/21/24 1259  Weight: 157 lb (71.2 kg)    Physical Exam Constitutional:      General: He is not  in acute distress.  Appearance: He is obese.  HENT:     Head: Normocephalic and atraumatic.  Eyes:     General: No scleral icterus. Cardiovascular:     Rate and Rhythm: Normal rate and regular rhythm.  Pulmonary:     Effort: Pulmonary effort is normal. No respiratory distress.     Breath sounds: No wheezing.  Abdominal:     General: Bowel sounds are normal. There is no distension.     Palpations: Abdomen is soft.  Musculoskeletal:        General: No deformity. Normal range of motion.     Cervical back: Normal range of motion and neck supple.     Right lower leg: No edema.     Left lower leg: No edema.  Skin:    General: Skin is warm and dry.     Findings: No erythema or rash.  Neurological:     Mental Status: He is alert and oriented to person, place, and time. Mental status is at baseline.     Cranial Nerves: No cranial nerve deficit.  Psychiatric:        Mood and Affect: Mood normal.      LABORATORY DATA:  I have reviewed the data as listed    Latest Ref Rng & Units 03/21/2024   12:45 PM 03/07/2024    8:34 AM 02/29/2024   12:40 PM  CBC  WBC 4.0 - 10.5 K/uL 4.8  5.0  8.8   Hemoglobin 13.0 - 17.0 g/dL 9.2  89.5  88.4   Hematocrit 39.0 - 52.0 % 27.6  30.0  33.0   Platelets 150 - 400 K/uL 110  116  117       Latest Ref Rng & Units 03/21/2024   12:45 PM 03/07/2024    8:34 AM 02/29/2024   12:39 PM  CMP  Glucose 70 - 99 mg/dL 878  99  856   BUN 8 - 23 mg/dL 19  30  30    Creatinine 0.61 - 1.24 mg/dL 8.80  8.88  9.07   Sodium 135 - 145 mmol/L 137  136  138   Potassium 3.5 - 5.1 mmol/L 4.0  4.2  4.5   Chloride 98 - 111 mmol/L 106  106  108   CO2 22 - 32 mmol/L 25  23  22    Calcium  8.9 - 10.3 mg/dL 8.6  8.8  8.8   Total Protein 6.5 - 8.1 g/dL 7.4  7.0  7.3   Total Bilirubin 0.0 - 1.2 mg/dL 1.4  1.4  0.9   Alkaline Phos 38 - 126 U/L 98  105  105   AST 15 - 41 U/L 22  33  27   ALT 0 - 44 U/L 63  95  74    Lab Results  Component Value Date   IRON 100 03/14/2023   TIBC  272 03/14/2023   IRONPCTSAT 37 03/14/2023   FERRITIN 249 03/14/2023     RADIOGRAPHIC STUDIES: I have personally reviewed the radiological images as listed and agreed with the findings in the report. No results found.

## 2024-03-21 NOTE — Progress Notes (Signed)
 Patient is doing well, no new questions or concerns for the doctor today.

## 2024-03-21 NOTE — Assessment & Plan Note (Signed)
 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  Stable count Monitor.

## 2024-03-22 LAB — KAPPA/LAMBDA LIGHT CHAINS
Kappa free light chain: 3.6 mg/L (ref 3.3–19.4)
Kappa, lambda light chain ratio: 0.04 — ABNORMAL LOW (ref 0.26–1.65)
Lambda free light chains: 84.6 mg/L — ABNORMAL HIGH (ref 5.7–26.3)

## 2024-03-26 LAB — MULTIPLE MYELOMA PANEL, SERUM
Albumin SerPl Elph-Mcnc: 3.9 g/dL (ref 2.9–4.4)
Albumin/Glob SerPl: 1.2 (ref 0.7–1.7)
Alpha 1: 0.2 g/dL (ref 0.0–0.4)
Alpha2 Glob SerPl Elph-Mcnc: 0.6 g/dL (ref 0.4–1.0)
B-Globulin SerPl Elph-Mcnc: 0.8 g/dL (ref 0.7–1.3)
Gamma Glob SerPl Elph-Mcnc: 1.7 g/dL (ref 0.4–1.8)
Globulin, Total: 3.3 g/dL (ref 2.2–3.9)
IgA: 949 mg/dL — ABNORMAL HIGH (ref 61–437)
IgG (Immunoglobin G), Serum: 1124 mg/dL (ref 603–1613)
IgM (Immunoglobulin M), Srm: <5 mg/dL — ABNORMAL LOW (ref 20–172)
M Protein SerPl Elph-Mcnc: 0.7 g/dL — ABNORMAL HIGH
Total Protein ELP: 7.2 g/dL (ref 6.0–8.5)

## 2024-03-28 ENCOUNTER — Inpatient Hospital Stay

## 2024-03-28 VITALS — BP 100/74 | HR 91 | Temp 97.2°F | Resp 18 | Ht 67.0 in | Wt 162.0 lb

## 2024-03-28 DIAGNOSIS — C9000 Multiple myeloma not having achieved remission: Secondary | ICD-10-CM

## 2024-03-28 DIAGNOSIS — Z5112 Encounter for antineoplastic immunotherapy: Secondary | ICD-10-CM | POA: Diagnosis not present

## 2024-03-28 LAB — CBC WITH DIFFERENTIAL (CANCER CENTER ONLY)
Abs Immature Granulocytes: 0.06 K/uL (ref 0.00–0.07)
Basophils Absolute: 0 K/uL (ref 0.0–0.1)
Basophils Relative: 0 %
Eosinophils Absolute: 0 K/uL (ref 0.0–0.5)
Eosinophils Relative: 0 %
HCT: 25.4 % — ABNORMAL LOW (ref 39.0–52.0)
Hemoglobin: 8.5 g/dL — ABNORMAL LOW (ref 13.0–17.0)
Immature Granulocytes: 1 %
Lymphocytes Relative: 14 %
Lymphs Abs: 0.8 K/uL (ref 0.7–4.0)
MCH: 37.4 pg — ABNORMAL HIGH (ref 26.0–34.0)
MCHC: 33.5 g/dL (ref 30.0–36.0)
MCV: 111.9 fL — ABNORMAL HIGH (ref 80.0–100.0)
Monocytes Absolute: 0.4 K/uL (ref 0.1–1.0)
Monocytes Relative: 6 %
Neutro Abs: 4.7 K/uL (ref 1.7–7.7)
Neutrophils Relative %: 79 %
Platelet Count: 90 K/uL — ABNORMAL LOW (ref 150–400)
RBC: 2.27 MIL/uL — ABNORMAL LOW (ref 4.22–5.81)
RDW: 15.9 % — ABNORMAL HIGH (ref 11.5–15.5)
WBC Count: 5.9 K/uL (ref 4.0–10.5)
nRBC: 0 % (ref 0.0–0.2)

## 2024-03-28 LAB — CMP (CANCER CENTER ONLY)
ALT: 37 U/L (ref 0–44)
AST: 18 U/L (ref 15–41)
Albumin: 3.9 g/dL (ref 3.5–5.0)
Alkaline Phosphatase: 100 U/L (ref 38–126)
Anion gap: 6 (ref 5–15)
BUN: 24 mg/dL — ABNORMAL HIGH (ref 8–23)
CO2: 23 mmol/L (ref 22–32)
Calcium: 8.3 mg/dL — ABNORMAL LOW (ref 8.9–10.3)
Chloride: 107 mmol/L (ref 98–111)
Creatinine: 0.85 mg/dL (ref 0.61–1.24)
GFR, Estimated: >60 mL/min (ref >60)
Glucose, Bld: 128 mg/dL — ABNORMAL HIGH (ref 70–99)
Potassium: 3.9 mmol/L (ref 3.5–5.1)
Sodium: 136 mmol/L (ref 135–145)
Total Bilirubin: 1.2 mg/dL (ref 0.0–1.2)
Total Protein: 7.1 g/dL (ref 6.5–8.1)

## 2024-03-28 LAB — VITAMIN B12: Vitamin B-12: 419 pg/mL (ref 180–914)

## 2024-03-28 LAB — IRON AND TIBC
Iron: 70 ug/dL (ref 45–182)
Saturation Ratios: 23 % (ref 17.9–39.5)
TIBC: 304 ug/dL (ref 250–450)
UIBC: 234 ug/dL

## 2024-03-28 LAB — FERRITIN: Ferritin: 535 ng/mL — ABNORMAL HIGH (ref 24–336)

## 2024-03-28 LAB — FOLATE: Folate: 34 ng/mL (ref >5.9)

## 2024-03-28 MED ORDER — SODIUM CHLORIDE 0.9 % IV SOLN
INTRAVENOUS | Status: DC
  Filled 2024-03-28: qty 250

## 2024-03-28 MED ORDER — DEXAMETHASONE SODIUM PHOSPHATE 100 MG/10ML IJ SOLN
20.0000 mg | Freq: Once | INTRAMUSCULAR | Status: AC
  Administered 2024-03-28: 20 mg via INTRAVENOUS
  Filled 2024-03-28: qty 20
  Filled 2024-03-28: qty 2

## 2024-03-28 MED ORDER — SODIUM CHLORIDE 0.9 % IV SOLN
Freq: Once | INTRAVENOUS | Status: AC
Start: 2024-03-28 — End: 2024-03-28
  Filled 2024-03-28: qty 250

## 2024-03-28 MED ORDER — DEXTROSE 5 % IV SOLN
27.0000 mg/m2 | Freq: Once | INTRAVENOUS | Status: AC
  Administered 2024-03-28: 50 mg via INTRAVENOUS
  Filled 2024-03-28: qty 15

## 2024-03-28 NOTE — Patient Instructions (Signed)
 [facility] - A DEPT OF [facility]  Discharge Instructions: Thank you for choosing [facility] to provide your oncology and hematology care.  If you have a lab appointment with the Cancer Center, please go directly to the Cancer Center and check in at the registration area.  Wear comfortable clothing and clothing appropriate for easy access to any Portacath or PICC line.   We strive to give you quality time with your provider. You may need to reschedule your appointment if you arrive late (15 or more minutes).  Arriving late affects you and other patients whose appointments are after yours.  Also, if you miss three or more appointments without notifying the office, you may be dismissed from the clinic at the provider's discretion.      For prescription refill requests, have your pharmacy contact our office and allow 72 hours for refills to be completed.    Today you received the following chemotherapy and/or immunotherapy agents KYPROLIS      To help prevent nausea and vomiting after your treatment, we encourage you to take your nausea medication as directed.  BELOW ARE SYMPTOMS THAT SHOULD BE REPORTED IMMEDIATELY: *FEVER GREATER THAN 100.4 F (38 C) OR HIGHER *CHILLS OR SWEATING *NAUSEA AND VOMITING THAT IS NOT CONTROLLED WITH YOUR NAUSEA MEDICATION *UNUSUAL SHORTNESS OF BREATH *UNUSUAL BRUISING OR BLEEDING *URINARY PROBLEMS (pain or burning when urinating, or frequent urination) *BOWEL PROBLEMS (unusual diarrhea, constipation, pain near the anus) TENDERNESS IN MOUTH AND THROAT WITH OR WITHOUT PRESENCE OF ULCERS (sore throat, sores in mouth, or a toothache) UNUSUAL RASH, SWELLING OR PAIN  UNUSUAL VAGINAL DISCHARGE OR ITCHING   Items with * indicate a potential emergency and should be followed up as soon as possible or go to the Emergency Department if any problems should occur.  Please show the CHEMOTHERAPY ALERT CARD or IMMUNOTHERAPY  ALERT CARD at check-in to the Emergency Department and triage nurse.  Should you have questions after your visit or need to cancel or reschedule your appointment, please contact [facility] - A DEPT OF [facility]  [phone] and follow the prompts.  Office hours are 8:00 a.m. to 4:30 p.m. Monday - Friday. Please note that voicemails left after 4:00 p.m. may not be returned until the following business day.  We are closed weekends and major holidays. You have access to a nurse at all times for urgent questions. Please call the main number to the clinic [phone] and follow the prompts.  For any non-urgent questions, you may also contact your provider using MyChart. We now offer e-Visits for anyone 36 and older to request care online for non-urgent symptoms. For details visit [email or web address].   Also download the MyChart app! Go to the app store, search "MyChart", open the app, select [place], and log in with your MyChart username and password.   Carfilzomib Injection What is this medication? CARFILZOMIB (kar FILZ oh mib) treats multiple myeloma, a type of bone marrow cancer. It works by blocking a protein that causes cancer cells to grow and multiply. This helps to slow or stop the spread of cancer cells. This medicine may be used for other purposes; ask your health care provider or pharmacist if you have questions. COMMON BRAND NAME(S): KYPROLIS What should I tell my care team before I take this medication? They need to know if you have any of these conditions: Heart disease History of blood clots  Irregular heartbeat Kidney disease Liver disease Lung or breathing disease An unusual or allergic reaction to carfilzomib, or other medications, foods, dyes, or preservatives If you or your partner are pregnant or trying to get pregnant Breastfeeding How should I use this medication? This medication is injected into a vein. It is given by your  care team in a hospital or clinic setting. Talk to your care team about the use of this medication in children. Special care may be needed. Overdosage: If you think you have taken too much of this medicine contact a poison control center or emergency room at once. NOTE: This medicine is only for you. Do not share this medicine with others. What if I miss a dose? Keep appointments for follow-up doses. It is important not to miss your dose. Call your care team if you are unable to keep an appointment. What may interact with this medication? Interactions are not expected. This list may not describe all possible interactions. Give your health care provider a list of all the medicines, herbs, non-prescription drugs, or dietary supplements you use. Also tell them if you smoke, drink alcohol, or use illegal drugs. Some items may interact with your medicine. What should I watch for while using this medication? Your condition will be monitored carefully while you are receiving this medication. You may need blood work while taking this medication. Check with your care team if you have severe diarrhea, nausea, and vomiting, or if you sweat a lot. The loss of too much body fluid may make it dangerous for you to take this medication. This medication may affect your coordination, reaction time, or judgment. Do not drive or operate machinery until you know how this medication affects you. Sit up or stand slowly to reduce the risk of dizzy or fainting spells. Drinking alcohol with this medication can increase the risk of these side effects. Talk to your care team if you may be pregnant. Serious birth defects can occur if you take this medication during pregnancy and for 6 months after the last dose. You will need a negative pregnancy test before starting this medication. Contraception is recommended while taking this medication and for 6 months after the last dose. Your care team can help you find an option that works  for you. If your partner can get pregnant, use a condom during sex while taking this medication and for 3 months after the last dose. Do not breastfeed while taking this medication and for 2 weeks after the last dose. This medication may cause infertility. Talk to your care team if you are concerned about your fertility. What side effects may I notice from receiving this medication? Side effects that you should report to your care team as soon as possible: Allergic reactions--skin rash, itching, hives, swelling of the face, lips, tongue, or throat Bleeding--bloody or black, tar-like stools, vomiting blood or brown material that looks like coffee grounds, red or dark brown urine, small red or purple spots on skin, unusual bruising or bleeding Blood clot--pain, swelling, or warmth in the leg, shortness of breath, chest pain Dizziness, loss of balance or coordination, confusion or trouble speaking Heart attack--pain or tightness in the chest, shoulders, arms, or jaw, nausea, shortness of breath, cold or clammy skin, feeling faint or lightheaded Heart failure--shortness of breath, swelling of the ankles, feet, or hands, sudden weight gain, unusual weakness or fatigue Heart rhythm changes--fast or irregular heartbeat, dizziness, feeling faint or lightheaded, chest pain, trouble breathing Increase in blood pressure Infection--fever, chills,  cough, sore throat, wounds that don't heal, pain or trouble when passing urine, general feeling of discomfort or being unwell Infusion reactions--chest pain, shortness of breath or trouble breathing, feeling faint or lightheaded Kidney injury--decrease in the amount of urine, swelling of the ankles, hands, or feet Liver injury--right upper belly pain, loss of appetite, nausea, light-colored stool, dark yellow or brown urine, yellowing skin or eyes, unusual weakness or fatigue Lung injury--shortness of breath or trouble breathing, cough, spitting up blood, chest pain,  fever Pulmonary hypertension--shortness of breath, chest pain, fast or irregular heartbeat, feeling faint or lightheaded, fatigue, swelling of the ankles or feet Stomach pain, bloody diarrhea, pale skin, unusual weakness or fatigue, decrease in the amount of urine, which may be signs of hemolytic uremic syndrome Sudden and severe headache, confusion, change in vision, seizures, which may be signs of posterior reversible encephalopathy syndrome (PRES) TTP--purple spots on the skin or inside the mouth, pale skin, yellowing skin or eyes, unusual weakness or fatigue, fever, fast or irregular heartbeat, confusion, change in vision, trouble speaking, trouble walking Tumor lysis syndrome (TLS)--nausea, vomiting, diarrhea, decrease in the amount of urine, dark urine, unusual weakness or fatigue, confusion, muscle pain or cramps, fast or irregular heartbeat, joint pain Side effects that usually do not require medical attention (report to your care team if they continue or are bothersome): Diarrhea Fatigue Nausea Trouble sleeping This list may not describe all possible side effects. Call your doctor for medical advice about side effects. You may report side effects to FDA at 1-800-FDA-1088. Where should I keep my medication? This medication is given in a hospital or clinic. It will not be stored at home. NOTE: This sheet is a summary. It may not cover all possible information. If you have questions about this medicine, talk to your doctor, pharmacist, or health care provider.  2024 [employer] Standard (2022-01-28 00:00:00)

## 2024-04-03 MED FILL — Dexamethasone Sodium Phosphate Inj 100 MG/10ML: INTRAMUSCULAR | Qty: 2 | Status: AC

## 2024-04-04 ENCOUNTER — Encounter: Payer: Self-pay | Admitting: Oncology

## 2024-04-04 ENCOUNTER — Inpatient Hospital Stay

## 2024-04-04 ENCOUNTER — Ambulatory Visit: Payer: Self-pay | Admitting: Oncology

## 2024-04-04 ENCOUNTER — Inpatient Hospital Stay (HOSPITAL_BASED_OUTPATIENT_CLINIC_OR_DEPARTMENT_OTHER): Admitting: Oncology

## 2024-04-04 VITALS — BP 105/79 | HR 112 | Temp 97.6°F | Resp 18 | Ht 67.0 in | Wt 166.0 lb

## 2024-04-04 DIAGNOSIS — C9000 Multiple myeloma not having achieved remission: Secondary | ICD-10-CM

## 2024-04-04 DIAGNOSIS — N1832 Chronic kidney disease, stage 3b: Secondary | ICD-10-CM | POA: Diagnosis not present

## 2024-04-04 DIAGNOSIS — I4891 Unspecified atrial fibrillation: Secondary | ICD-10-CM | POA: Diagnosis not present

## 2024-04-04 DIAGNOSIS — T451X5A Adverse effect of antineoplastic and immunosuppressive drugs, initial encounter: Secondary | ICD-10-CM

## 2024-04-04 DIAGNOSIS — D6481 Anemia due to antineoplastic chemotherapy: Secondary | ICD-10-CM

## 2024-04-04 DIAGNOSIS — Z5112 Encounter for antineoplastic immunotherapy: Secondary | ICD-10-CM | POA: Diagnosis not present

## 2024-04-04 LAB — CBC WITH DIFFERENTIAL (CANCER CENTER ONLY)
Abs Immature Granulocytes: 0.04 K/uL (ref 0.00–0.07)
Basophils Absolute: 0 K/uL (ref 0.0–0.1)
Basophils Relative: 0 %
Eosinophils Absolute: 0 K/uL (ref 0.0–0.5)
Eosinophils Relative: 0 %
HCT: 26.4 % — ABNORMAL LOW (ref 39.0–52.0)
Hemoglobin: 8.5 g/dL — ABNORMAL LOW (ref 13.0–17.0)
Immature Granulocytes: 1 %
Lymphocytes Relative: 17 %
Lymphs Abs: 0.8 K/uL (ref 0.7–4.0)
MCH: 36.6 pg — ABNORMAL HIGH (ref 26.0–34.0)
MCHC: 32.2 g/dL (ref 30.0–36.0)
MCV: 113.8 fL — ABNORMAL HIGH (ref 80.0–100.0)
Monocytes Absolute: 0.3 K/uL (ref 0.1–1.0)
Monocytes Relative: 8 %
Neutro Abs: 3.2 K/uL (ref 1.7–7.7)
Neutrophils Relative %: 74 %
Platelet Count: 103 K/uL — ABNORMAL LOW (ref 150–400)
RBC: 2.32 MIL/uL — ABNORMAL LOW (ref 4.22–5.81)
RDW: 16.3 % — ABNORMAL HIGH (ref 11.5–15.5)
WBC Count: 4.4 K/uL (ref 4.0–10.5)
nRBC: 0 % (ref 0.0–0.2)

## 2024-04-04 LAB — CMP (CANCER CENTER ONLY)
ALT: 31 U/L (ref 0–44)
AST: 22 U/L (ref 15–41)
Albumin: 3.9 g/dL (ref 3.5–5.0)
Alkaline Phosphatase: 93 U/L (ref 38–126)
Anion gap: 6 (ref 5–15)
BUN: 22 mg/dL (ref 8–23)
CO2: 21 mmol/L — ABNORMAL LOW (ref 22–32)
Calcium: 8.2 mg/dL — ABNORMAL LOW (ref 8.9–10.3)
Chloride: 107 mmol/L (ref 98–111)
Creatinine: 1.05 mg/dL (ref 0.61–1.24)
GFR, Estimated: >60 mL/min (ref >60)
Glucose, Bld: 108 mg/dL — ABNORMAL HIGH (ref 70–99)
Potassium: 3.9 mmol/L (ref 3.5–5.1)
Sodium: 134 mmol/L — ABNORMAL LOW (ref 135–145)
Total Bilirubin: 1.3 mg/dL — ABNORMAL HIGH (ref 0.0–1.2)
Total Protein: 6.7 g/dL (ref 6.5–8.1)

## 2024-04-04 NOTE — Assessment & Plan Note (Addendum)
 Bone marrow biopsy was reviewed. 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  Currently on 2nd line Carfilzomib  D1, 8 15 Q28 days and Selinexor  60mg  Dexamethasone  weekly. M protein responds well.  hold cycle 3 D15 Carfilzomib , hold weekly Selinexor  60mg  due to dizziness.  Prophylaxis  Continue Acyclovir , At risk of thrombosis, on Eliquis  5mg  BID for A fib Xegeva monthly if calcium  meets criteria - hold due to hypocalcemia Recommend calcium  and vitamin D  supplementation.

## 2024-04-04 NOTE — Assessment & Plan Note (Signed)
 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  Lab Results  Component Value Date   HGB 8.5 (L) 04/04/2024   TIBC 304 03/28/2024   IRONPCTSAT 23 03/28/2024   FERRITIN 535 (H) 03/28/2024    Hemoglobin is worse, hold off treatment

## 2024-04-04 NOTE — Progress Notes (Signed)
 Patient does not take Calcium  on daily basis he will take for about 2-3 days then stop.  Informed him that Dr. Babara recommends he take Calcium  1200 mg daily.

## 2024-04-04 NOTE — Assessment & Plan Note (Signed)
 Recommend patient to take calcium 1200mg  daily. Take vitamin D supplementation.

## 2024-04-04 NOTE — Telephone Encounter (Signed)
-----   Message from Nurse Almarie RAMAN sent at 04/04/2024  2:29 PM EDT -----  ----- Message ----- From: Babara Call, MD Sent: 04/04/2024   1:07 PM EDT To: Almarie JINNY Nett, RN  Calcium  is low. Please check if he is complaint with calcium  supplementation. How much is he taking. If he takes 1200mg , recommend him to go up to 1800mg  daily, divided in 2 doses. If he is not  compliant, please advise him to take 1200mg  daily. Thanks.  ----- Message ----- From: Rebecka, Lab In [place] Sent: 04/04/2024   9:02 AM EDT To: Call Babara, MD

## 2024-04-04 NOTE — Assessment & Plan Note (Signed)
 Encourage oral hydration and avoid nephrotoxins.

## 2024-04-04 NOTE — Assessment & Plan Note (Signed)
 He has tachycardia, and dizziness. Recommend patient to contact cardiology for further evaluation.  He is on Eliquis .

## 2024-04-04 NOTE — Progress Notes (Signed)
 Hematology/Oncology Progress note [phone] [phone]         Patient Care Team: Cleotilde Oneil FALCON, MD as PCP - General (Internal Medicine) Darliss Rogue, MD as PCP - Cardiology (Cardiology) Kennyth Chew, MD as PCP - Electrophysiology (Cardiology) Babara Call, MD as Consulting Physician (Oncology)  CHIEF COMPLAINTS/REASON FOR VISIT:  Multiple myeloma   ASSESSMENT & PLAN:   Cancer Staging  Multiple myeloma [facility]) Staging form: Plasma Cell Myeloma and Plasma Cell Disorders, AJCC 8th Edition - Clinical stage from 04/14/2023: RISS Stage II (Beta-2 -microglobulin (mg/L): 8.4, Albumin (g/dL): 2.6, ISS: Stage III, High-risk cytogenetics: Absent, LDH: Normal) - Signed by Babara Call, MD on 04/22/2023   Multiple myeloma (HCC) Bone marrow biopsy was reviewed. 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  Currently on 2nd line Carfilzomib  D1, 8 15 Q28 days and Selinexor  60mg  Dexamethasone  weekly. M protein responds well.  hold cycle 3 D15 Carfilzomib , hold weekly Selinexor  60mg  due to dizziness.  Prophylaxis  Continue Acyclovir , At risk of thrombosis, on Eliquis  5mg  BID for A fib Xegeva monthly if calcium  meets criteria - hold due to hypocalcemia Recommend calcium  and vitamin D  supplementation.    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  Lab Results  Component Value Date   HGB 8.5 (L) 04/04/2024   TIBC 304 03/28/2024   IRONPCTSAT 23 03/28/2024   FERRITIN 535 (H) 03/28/2024    Hemoglobin is worse, hold off treatment  CKD (chronic kidney disease) stage 3, GFR 30-59 ml/min (HCC) Encourage oral hydration and avoid nephrotoxins.    Atrial fibrillation (HCC) He has tachycardia, and  dizziness. Recommend patient to contact cardiology for further evaluation.  He is on Eliquis .   Hypocalcemia Recommend patient to take calcium  1200mg  daily. Take vitamin D  supplementation.    Orders Placed This Encounter  Procedures   CBC with Differential (Cancer Center Only)    Standing Status:   Future    Expected Date:   04/11/2024    Expiration Date:   04/11/2025   CMP (Cancer Center only)    Standing Status:   Future    Expected Date:   04/11/2024    Expiration Date:   04/11/2025   Follow-up per LOS  We spent sufficient time to discuss many aspect of care, questions were answered to patient's satisfaction.    Call Babara, MD, PhD [facility] Hematology Oncology 04/04/2024     HISTORY OF PRESENTING ILLNESS:  Victor Phillips is a  68 y.o.  male with PMH listed below who was referred to me for anemia  02/11/2023 - 02/14/2023 recent hospitalization due to pneumonia, respiratory failure.  He was found to have a hemoglobin decreased at 6.8, status post PRBC transfusion during admission.  EGD showed gastritis.  Colonoscopy was not remarkable. 02/11/2023 TIBC 221 ferritin 107, iron saturation 16. Patient is currently taking fusion plus Vitamin B12 level in the 300s. His echo showed grade 2 diastolic CHF.  He denies recent chest pain on exertion, shortness of breath on minimal exertion, pre-syncopal episodes, or palpitations He had not noticed any recent bleeding such as epistaxis, hematuria or hematochezia.  He denies over the counter NSAID ingestion.  Oncology History  Multiple myeloma (HCC)  04/14/2023 Initial Diagnosis   Multiple myeloma  03/13/2020 multiple myeloma panel showed M protein 4.3, IgA lambda Free lamda Level 142,-light chain ratio 0.07 04/05/2023 bone marrow biopsy showed hypercellular bone marrow with plasma cell neoplasm.The bone marrow is hypercellular for age with prominent increase in plasma cells representing 65% of all cells in the aspirate associated with  interstitial infiltrates and numerous variably sized aggregates in  the clot and biopsy sections.  The plasma cells display lambda light chain restriction consistent with plasma cell neoplasm.  Normal cytogenetics.  FISH studies pending.    04/14/2023 Cancer Staging   Staging form: Plasma Cell Myeloma and Plasma Cell Disorders, AJCC 8th Edition - Clinical stage from 04/14/2023: RISS Stage II (Beta-2 -microglobulin (mg/L): 8.4, Albumin (g/dL): 2.6, ISS: Stage III, High-risk cytogenetics: Absent, LDH: Normal) - Signed by Babara Call, MD on 04/22/2023 Stage prefix: Initial diagnosis Beta 2 microglobulin range (mg/L): Greater than or equal to 5.5 Albumin range (g/dL): Less than 3.5 Cytogenetics: 1q addition, Other mutation   04/14/2023 Imaging   Skeletal survey 1. No lytic lesions or other intrinsic bony abnormality. 2. Borderline cardiomegaly with an interval decrease in size. 3. Diffuse atheromatous arterial calcifications including bilateral carotid artery calcifications, right greater than left   04/22/2023 - 11/11/2023 Chemotherapy   Patient is on Treatment Plan : MYELOMA NEWLY DIAGNOSED TRANSPLANT CANDIDATE DaraVRd (Daratumumab  SQ) q21d     04/25/2023 Imaging   PET scan showed  1. Two tiny hypermetabolic lucent lesions in the left posteromedial eighth and ninth ribs. No additional evidence of multiple myeloma. 2. Aortic atherosclerosis (ICD10-I70.0). Coronary artery calcification.   07/14/2023 - 07/19/2023 Hospital Admission   Hospitalized due to ileus.    07/25/2023 Bone Marrow Biopsy   A-C. Peripheral blood and bone marrow, (peripheral smear, aspirate smear, touch preparation, clot section, core biopsy):   Persistent plasma cell myeloma, 50-60% lambda restricted plasma cells on core biopsy. Hypercellular bone marrow (70%) with trilineage hematopoiesis. Negative for increased blasts. Negative for significant dysplasia. Negative for amyloid deposition.   01/25/2024 -  Chemotherapy   Patient  is on Treatment Plan : MYELOMA RELAPSED/ REFRACTORY Carfilzomib  D1,8,15 (20/27) + selinexor   + Dexamethasone  (KPd) q28d       A Fib. on Eliquis  5mg  BID, follows up with cardiology.  He reports dizziness today. BP is 105/79, lower than his baseline. Tachycardic, HR 112. He has taken all his morning mediation, including his BP meds. He had a fall this morning.  Appetite is good. Gained weight.    MEDICAL HISTORY:  Past Medical History:  Diagnosis Date   Atrial fibrillation (HCC)    Diabetes mellitus without complication (HCC)    Hypercholesteremia    Hypertension    Multiple myeloma not having achieved remission (HCC)     SURGICAL HISTORY: Past Surgical History:  Procedure Laterality Date   CARDIOVERSION N/A 10/17/2023   Procedure: CARDIOVERSION;  Surgeon: Darliss Rogue, MD;  Location: [facility] ORS;  Service: Cardiovascular;  Laterality: N/A;   COLONOSCOPY N/A 02/13/2023   Procedure: COLONOSCOPY;  Surgeon: Toledo, Ladell POUR, MD;  Location: [facility] ENDOSCOPY;  Service: Gastroenterology;  Laterality: N/A;   COLONOSCOPY N/A 02/14/2023   Procedure: COLONOSCOPY;  Surgeon: Toledo, Ladell POUR, MD;  Location: [facility] ENDOSCOPY;  Service: Gastroenterology;  Laterality: N/A;   ESOPHAGOGASTRODUODENOSCOPY N/A 02/13/2023   Procedure: ESOPHAGOGASTRODUODENOSCOPY (EGD);  Surgeon: Toledo, Ladell POUR, MD;  Location: [facility] ENDOSCOPY;  Service: Gastroenterology;  Laterality: N/A;   IR BONE MARROW BIOPSY & ASPIRATION  11/24/2023    SOCIAL HISTORY: Social History   Socioeconomic History   Marital status: Married  Spouse name: Not on file   Number of children: Not on file   Years of education: Not on file   Highest education level: Not on file  Occupational History   Not on file  Tobacco Use   Smoking status: Never   Smokeless tobacco: Never  Vaping Use   Vaping status: Never Used  Substance and Sexual Activity   Alcohol use: Not Currently    Comment: quit approx 2001   Drug use: No   Sexual activity:  Not on file  Other Topics Concern   Not on file  Social History Narrative   Not on file   Social Drivers of Health   Financial Resource Strain: Medium Risk (01/13/2024)   Overall Financial Resource Strain (CARDIA)    Difficulty of Paying Living Expenses: Somewhat hard  Food Insecurity: Food Insecurity Present (01/13/2024)   Hunger Vital Sign    Worried About Running Out of Food in the Last Year: Sometimes true    Ran Out of Food in the Last Year: Sometimes true  Transportation Needs: No Transportation Needs (01/13/2024)   PRAPARE - Administrator, Civil Service (Medical): No    Lack of Transportation (Non-Medical): No  Physical Activity: Insufficiently Active (10/12/2023)   Received from [facility]   Exercise Vital Sign    On average, how many days per week do you engage in moderate to strenuous exercise (like a brisk walk)?: 7 days    On average, how many minutes do you engage in exercise at this level?: 10 min  Stress: No Stress Concern Present (10/12/2023)   Received from [facility] - Occupational Stress Questionnaire    Feeling of Stress : Not at all  Recent Concern: Stress - Stress Concern Present (10/04/2023)   Received from [facility] - Occupational Stress Questionnaire    Feeling of Stress : Rather much  Social Connections: Moderately Integrated (10/12/2023)   Received from [facility]   Social Connection and Isolation Panel    In a typical week, how many times do you talk on the phone with family, friends, or neighbors?: More than three times a week    How often do you get together with friends or relatives?: More than three times a week    How often do you attend church or religious services?: 1 to 4 times per year    Do you belong to any clubs or organizations such as church groups, unions, fraternal or  athletic groups, or school groups?: No    How often do you attend meetings of the clubs or organizations you belong to?: Never    Are you married, widowed, divorced, separated, never married, or living with a partner?: Married  Intimate Partner Violence: Not At Risk (07/14/2023)   Humiliation, Afraid, Rape, and Kick questionnaire    Fear of Current or Ex-Partner: No    Emotionally Abused: No    Physically Abused: No    Sexually Abused: No    FAMILY HISTORY: Family History  Problem Relation Age of Onset   Diabetes Mother    Heart attack Father     ALLERGIES:  has no known allergies.  MEDICATIONS:  Current Outpatient Medications  Medication Sig Dispense Refill   acetaminophen  (TYLENOL ) 325 MG tablet Take 2 tablets (650 mg total) by mouth every 6 (six) hours as needed for mild pain (or Fever >/=  101). 90 tablet 1   acyclovir  (ZOVIRAX ) 400 MG tablet Take 1 tablet (400 mg total) by mouth 2 (two) times daily. 180 tablet 0   apixaban  (ELIQUIS ) 5 MG TABS tablet Take 1 tablet (5 mg total) by mouth 2 (two) times daily. 60 tablet 5   atorvastatin  (LIPITOR) 10 MG tablet Take 10 mg by mouth daily.     calcium  carbonate (OS-CAL) 600 MG TABS tablet Take 2 tablets (1,200 mg total) by mouth daily.     cholecalciferol (VITAMIN D3) 25 MCG (1000 UNIT) tablet Take 1 tablet (1,000 Units total) by mouth daily.     cyanocobalamin  (VITAMIN B12) 1000 MCG tablet Take 1 tablet (1,000 mcg total) by mouth daily.     dexamethasone  (DECADRON ) 4 MG tablet Take 5 tablets (20 mg) for 1 day on the week after chemotherapy (day 22). 5 tablet 11   glipiZIDE (GLUCOTROL XL) 2.5 MG 24 hr tablet Take 5 mg by mouth daily with breakfast.     loperamide  (IMODIUM ) 2 MG capsule Take 1 capsule (2 mg total) by mouth See admin instructions. With onset of diarrhea, take 4 mg,then 2 mg every 2 hours or after every loose bowel movements  maximum: 16 mg/day 60 capsule 2   metoprolol  succinate (TOPROL -XL) 50 MG 24 hr tablet Take 1.5  tablets (75 mg total) by mouth daily. Take with or immediately following a meal. 135 tablet 3   Multiple Vitamin (MULTIVITAMIN WITH MINERALS) TABS tablet Take 1 tablet by mouth daily. 90 tablet 1   ondansetron  (ZOFRAN ) 8 MG tablet Take 1 tablet (8 mg total) by mouth every 8 (eight) hours as needed for nausea or vomiting. 90 tablet 1   pantoprazole  (PROTONIX ) 40 MG tablet Take 1 tablet (40 mg total) by mouth daily. 30 tablet 1   prochlorperazine  (COMPAZINE ) 10 MG tablet Take 1 tablet (10 mg total) by mouth every 6 (six) hours as needed for nausea or vomiting. 90 tablet 1   selinexor  (XPOVIO , 60 MG ONCE WEEKLY,) Therapy Pack (60 mg once weekly) Take 1 tablet (60 mg total) by mouth once a week. 4 tablet 4   temazepam (RESTORIL) 15 MG capsule Take 15 mg by mouth at bedtime.     No current facility-administered medications for this visit.    Review of Systems  Constitutional:  Positive for fatigue. Negative for appetite change, chills, fever and unexpected weight change.  HENT:   Negative for hearing loss and voice change.   Eyes:  Negative for eye problems and icterus.  Respiratory:  Negative for chest tightness, cough and shortness of breath.   Cardiovascular:  Negative for chest pain and leg swelling.  Gastrointestinal:  Negative for abdominal distention, abdominal pain and diarrhea.  Endocrine: Negative for hot flashes.  Genitourinary:  Negative for difficulty urinating, dysuria and frequency.   Musculoskeletal:  Negative for arthralgias.  Skin:  Negative for itching and rash.  Neurological:  Negative for light-headedness and numbness.  Hematological:  Negative for adenopathy. Does not bruise/bleed easily.  Psychiatric/Behavioral:  Positive for sleep disturbance. Negative for confusion.     PHYSICAL EXAMINATION: Vitals:   04/04/24 0911  BP: 105/79  Pulse: (!) 112  Resp: 18  Temp: 97.6 F (36.4 C)  SpO2: 98%   Filed Weights   04/04/24 0911  Weight: 166 lb (75.3 kg)    Physical  Exam Constitutional:      General: He is not in acute distress.    Appearance: He is obese.  HENT:  Head: Normocephalic and atraumatic.  Eyes:     General: No scleral icterus. Cardiovascular:     Rate and Rhythm: Normal rate and regular rhythm.  Pulmonary:     Effort: Pulmonary effort is normal. No respiratory distress.     Breath sounds: No wheezing.  Abdominal:     General: Bowel sounds are normal. There is no distension.     Palpations: Abdomen is soft.  Musculoskeletal:        General: No deformity. Normal range of motion.     Cervical back: Normal range of motion and neck supple.     Right lower leg: No edema.     Left lower leg: No edema.  Skin:    General: Skin is warm and dry.     Findings: No erythema or rash.  Neurological:     Mental Status: He is alert and oriented to person, place, and time. Mental status is at baseline.     Cranial Nerves: No cranial nerve deficit.  Psychiatric:        Mood and Affect: Mood normal.      LABORATORY DATA:  I have reviewed the data as listed    Latest Ref Rng & Units 04/04/2024    8:50 AM 03/28/2024    1:10 PM 03/21/2024   12:45 PM  CBC  WBC 4.0 - 10.5 K/uL 4.4  5.9  4.8   Hemoglobin 13.0 - 17.0 g/dL 8.5  8.5  9.2   Hematocrit 39.0 - 52.0 % 26.4  25.4  27.6   Platelets 150 - 400 K/uL 103  90  110       Latest Ref Rng & Units 04/04/2024    8:50 AM 03/28/2024    1:10 PM 03/21/2024   12:45 PM  CMP  Glucose 70 - 99 mg/dL 891  871  878   BUN 8 - 23 mg/dL 22  24  19    Creatinine 0.61 - 1.24 mg/dL 8.94  9.14  8.80   Sodium 135 - 145 mmol/L 134  136  137   Potassium 3.5 - 5.1 mmol/L 3.9  3.9  4.0   Chloride 98 - 111 mmol/L 107  107  106   CO2 22 - 32 mmol/L 21  23  25    Calcium  8.9 - 10.3 mg/dL 8.2  8.3  8.6   Total Protein 6.5 - 8.1 g/dL 6.7  7.1  7.4   Total Bilirubin 0.0 - 1.2 mg/dL 1.3  1.2  1.4   Alkaline Phos 38 - 126 U/L 93  100  98   AST 15 - 41 U/L 22  18  22    ALT 0 - 44 U/L 31  37  63    Lab Results   Component Value Date   IRON 70 03/28/2024   TIBC 304 03/28/2024   IRONPCTSAT 23 03/28/2024   FERRITIN 535 (H) 03/28/2024     RADIOGRAPHIC STUDIES: I have personally reviewed the radiological images as listed and agreed with the findings in the report. No results found.

## 2024-04-04 NOTE — Progress Notes (Signed)
 Patient has been having some dizziness, he did fall this morning. He is also having some constipation.

## 2024-04-05 ENCOUNTER — Other Ambulatory Visit: Payer: Self-pay

## 2024-04-10 MED FILL — Dexamethasone Sodium Phosphate Inj 100 MG/10ML: INTRAMUSCULAR | Qty: 2 | Status: AC

## 2024-04-11 ENCOUNTER — Encounter: Payer: Self-pay | Admitting: Oncology

## 2024-04-11 ENCOUNTER — Inpatient Hospital Stay (HOSPITAL_BASED_OUTPATIENT_CLINIC_OR_DEPARTMENT_OTHER): Admitting: Oncology

## 2024-04-11 ENCOUNTER — Inpatient Hospital Stay

## 2024-04-11 VITALS — BP 106/72 | HR 55 | Temp 96.0°F | Resp 18 | Wt 170.4 lb

## 2024-04-11 DIAGNOSIS — I4891 Unspecified atrial fibrillation: Secondary | ICD-10-CM

## 2024-04-11 DIAGNOSIS — C9000 Multiple myeloma not having achieved remission: Secondary | ICD-10-CM

## 2024-04-11 DIAGNOSIS — D6481 Anemia due to antineoplastic chemotherapy: Secondary | ICD-10-CM

## 2024-04-11 DIAGNOSIS — N1832 Chronic kidney disease, stage 3b: Secondary | ICD-10-CM | POA: Diagnosis not present

## 2024-04-11 DIAGNOSIS — Z5112 Encounter for antineoplastic immunotherapy: Secondary | ICD-10-CM | POA: Diagnosis not present

## 2024-04-11 DIAGNOSIS — R42 Dizziness and giddiness: Secondary | ICD-10-CM | POA: Insufficient documentation

## 2024-04-11 DIAGNOSIS — T451X5A Adverse effect of antineoplastic and immunosuppressive drugs, initial encounter: Secondary | ICD-10-CM

## 2024-04-11 LAB — CMP (CANCER CENTER ONLY)
ALT: 29 U/L (ref 0–44)
AST: 22 U/L (ref 15–41)
Albumin: 3.9 g/dL (ref 3.5–5.0)
Alkaline Phosphatase: 88 U/L (ref 38–126)
Anion gap: 8 (ref 5–15)
BUN: 24 mg/dL — ABNORMAL HIGH (ref 8–23)
CO2: 22 mmol/L (ref 22–32)
Calcium: 8.5 mg/dL — ABNORMAL LOW (ref 8.9–10.3)
Chloride: 108 mmol/L (ref 98–111)
Creatinine: 1.04 mg/dL (ref 0.61–1.24)
GFR, Estimated: >60 mL/min (ref >60)
Glucose, Bld: 148 mg/dL — ABNORMAL HIGH (ref 70–99)
Potassium: 4 mmol/L (ref 3.5–5.1)
Sodium: 138 mmol/L (ref 135–145)
Total Bilirubin: 1.1 mg/dL (ref 0.0–1.2)
Total Protein: 7 g/dL (ref 6.5–8.1)

## 2024-04-11 LAB — CBC WITH DIFFERENTIAL (CANCER CENTER ONLY)
Abs Immature Granulocytes: 0.02 K/uL (ref 0.00–0.07)
Basophils Absolute: 0 K/uL (ref 0.0–0.1)
Basophils Relative: 0 %
Eosinophils Absolute: 0 K/uL (ref 0.0–0.5)
Eosinophils Relative: 1 %
HCT: 27.9 % — ABNORMAL LOW (ref 39.0–52.0)
Hemoglobin: 9 g/dL — ABNORMAL LOW (ref 13.0–17.0)
Immature Granulocytes: 1 %
Lymphocytes Relative: 13 %
Lymphs Abs: 0.5 K/uL — ABNORMAL LOW (ref 0.7–4.0)
MCH: 37.2 pg — ABNORMAL HIGH (ref 26.0–34.0)
MCHC: 32.3 g/dL (ref 30.0–36.0)
MCV: 115.3 fL — ABNORMAL HIGH (ref 80.0–100.0)
Monocytes Absolute: 0.4 K/uL (ref 0.1–1.0)
Monocytes Relative: 8 %
Neutro Abs: 3.3 K/uL (ref 1.7–7.7)
Neutrophils Relative %: 77 %
Platelet Count: 104 K/uL — ABNORMAL LOW (ref 150–400)
RBC: 2.42 MIL/uL — ABNORMAL LOW (ref 4.22–5.81)
RDW: 14.6 % (ref 11.5–15.5)
WBC Count: 4.3 K/uL (ref 4.0–10.5)
nRBC: 0 % (ref 0.0–0.2)

## 2024-04-11 NOTE — Assessment & Plan Note (Addendum)
 Dizziness/lightheaded, Etiology is unknown.suspect multifactorial, lower BP, anemia.  He reports hydrating himself well.  Anemia has improved since holding chemotherapy, persistent dizziness. He has an upcoming cardiology appointment for further evaluation, currently on beta-blocker.  Discussed with cardiology, he does not have any options.

## 2024-04-11 NOTE — Assessment & Plan Note (Addendum)
 Bone marrow biopsy was reviewed. 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  Currently on 2nd line Carfilzomib  D1, 8 15 Q28 days and Selinexor  60mg  Dexamethasone  weekly. M protein responds well.  Cancel cycle 3 D15 Carfilzomib , hold weekly Selinexor  60mg  due to dizziness.  Prophylaxis  Continue Acyclovir , At risk of thrombosis, on Eliquis  5mg  BID for A fib Xegeva monthly if calcium  meets criteria - hold due to hypocalcemia Recommend calcium  and vitamin D  supplementation.

## 2024-04-11 NOTE — Assessment & Plan Note (Addendum)
 He has dizziness and BP is lowe than his baseline.   Follow up with cardiology. I discussed case with Dr. Kennyth and his NP Suzann He is on Eliquis  which he reports being compliant.

## 2024-04-11 NOTE — Progress Notes (Signed)
 Hematology/Oncology Progress note [phone] [phone]         Patient Care Team: Cleotilde Oneil FALCON, MD as PCP - General (Internal Medicine) Darliss Rogue, MD as PCP - Cardiology (Cardiology) Kennyth Chew, MD as PCP - Electrophysiology (Cardiology) Babara Call, MD as Consulting Physician (Oncology)  CHIEF COMPLAINTS/REASON FOR VISIT:  Multiple myeloma   ASSESSMENT & PLAN:   Cancer Staging  Multiple myeloma [facility]) Staging form: Plasma Cell Myeloma and Plasma Cell Disorders, AJCC 8th Edition - Clinical stage from 04/14/2023: RISS Stage II (Beta-2 -microglobulin (mg/L): 8.4, Albumin (g/dL): 2.6, ISS: Stage III, High-risk cytogenetics: Absent, LDH: Normal) - Signed by Babara Call, MD on 04/22/2023   Multiple myeloma (HCC) Bone marrow biopsy was reviewed. 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  Currently on 2nd line Carfilzomib  D1, 8 15 Q28 days and Selinexor  60mg  Dexamethasone  weekly. M protein responds well.  Cancel cycle 3 D15 Carfilzomib , hold weekly Selinexor  60mg  due to dizziness.  Prophylaxis  Continue Acyclovir , At risk of thrombosis, on Eliquis  5mg  BID for A fib Xegeva monthly if calcium  meets criteria - hold due to hypocalcemia Recommend calcium  and vitamin D  supplementation.    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  Lab Results  Component Value Date   HGB 9.0 (L) 04/11/2024   TIBC 304 03/28/2024   IRONPCTSAT 23 03/28/2024   FERRITIN 535 (H) 03/28/2024    Hemoglobin has improved.   CKD (chronic kidney disease) stage 3, GFR 30-59 ml/min (HCC) Encourage oral hydration and avoid nephrotoxins.    Atrial fibrillation (HCC) He has dizziness and BP is lowe than his  baseline.   Follow up with cardiology. I discussed case with Dr. Kennyth and his NP Suzann He is on Eliquis  which he reports being compliant.   Dizziness Dizziness/lightheaded, Etiology is unknown.suspect multifactorial, lower BP, anemia.  He reports hydrating himself well.  Anemia has improved since holding chemotherapy, persistent dizziness. He has an upcoming cardiology appointment for further evaluation, currently on beta-blocker.  Discussed with cardiology, he does not have any options.    No orders of the defined types were placed in this encounter.  Follow-up  2 weeks.   We spent sufficient time to discuss many aspect of care, questions were answered to patient's satisfaction.    Call Babara, MD, PhD [facility] Hematology Oncology 04/11/2024     HISTORY OF PRESENTING ILLNESS:  Victor Phillips is a  68 y.o.  male with PMH listed below who was referred to me for anemia  02/11/2023 - 02/14/2023 recent hospitalization due to pneumonia, respiratory failure.  He was found to have a hemoglobin decreased at 6.8, status post PRBC transfusion during admission.  EGD showed gastritis.  Colonoscopy was not remarkable. 02/11/2023 TIBC 221 ferritin 107, iron saturation 16. Patient is currently taking fusion plus Vitamin B12 level in the 300s. His echo showed grade 2 diastolic CHF.  He denies recent chest pain on exertion, shortness of breath on minimal exertion, pre-syncopal episodes, or palpitations He had not noticed any recent bleeding such as epistaxis, hematuria or hematochezia.  He denies over the counter NSAID ingestion.  Oncology History  Multiple myeloma (HCC)  04/14/2023 Initial Diagnosis   Multiple myeloma   03/13/2020 multiple myeloma panel showed M protein  4.3, IgA lambda Free lamda Level 142,-light chain ratio 0.07 04/05/2023 bone marrow biopsy showed hypercellular bone marrow with plasma cell neoplasm.The bone marrow is hypercellular for age with prominent increase in plasma  cells representing 65% of all cells in the aspirate associated with interstitial infiltrates and numerous variably sized aggregates in  the clot and biopsy sections.  The plasma cells display lambda light chain restriction consistent with plasma cell neoplasm.  Normal cytogenetics.  FISH studies pending.    04/14/2023 Cancer Staging   Staging form: Plasma Cell Myeloma and Plasma Cell Disorders, AJCC 8th Edition - Clinical stage from 04/14/2023: RISS Stage II (Beta-2 -microglobulin (mg/L): 8.4, Albumin (g/dL): 2.6, ISS: Stage III, High-risk cytogenetics: Absent, LDH: Normal) - Signed by Babara Call, MD on 04/22/2023 Stage prefix: Initial diagnosis Beta 2 microglobulin range (mg/L): Greater than or equal to 5.5 Albumin range (g/dL): Less than 3.5 Cytogenetics: 1q addition, Other mutation   04/14/2023 Imaging   Skeletal survey 1. No lytic lesions or other intrinsic bony abnormality. 2. Borderline cardiomegaly with an interval decrease in size. 3. Diffuse atheromatous arterial calcifications including bilateral carotid artery calcifications, right greater than left   04/22/2023 - 11/11/2023 Chemotherapy   Patient is on Treatment Plan : MYELOMA NEWLY DIAGNOSED TRANSPLANT CANDIDATE DaraVRd (Daratumumab  SQ) q21d     04/25/2023 Imaging   PET scan showed  1. Two tiny hypermetabolic lucent lesions in the left posteromedial eighth and ninth ribs. No additional evidence of multiple myeloma. 2. Aortic atherosclerosis (ICD10-I70.0). Coronary artery calcification.   07/14/2023 - 07/19/2023 Hospital Admission   Hospitalized due to ileus.    07/25/2023 Bone Marrow Biopsy   A-C. Peripheral blood and bone marrow, (peripheral smear, aspirate smear, touch preparation, clot section, core biopsy):   Persistent plasma cell myeloma, 50-60% lambda restricted plasma cells on core biopsy. Hypercellular bone marrow (70%) with trilineage hematopoiesis. Negative for increased blasts. Negative for significant  dysplasia. Negative for amyloid deposition.   01/25/2024 -  Chemotherapy   Patient is on Treatment Plan : MYELOMA RELAPSED/ REFRACTORY Carfilzomib  D1,8,15 (20/27) + selinexor   + Dexamethasone  (KPd) q28d       A Fib. on Eliquis  5mg  BID, follows up with cardiology.  Patient reports being compliant on Eliquis .  He reports ongoing intermittent episodes of dizziness and lightheadedness for about a month.  He reports hydrating himself well usually about 50-60 ounces of fluid intake.  Denies any exacerbating or alleviating factors.  He also mentions sensation of left sided face numbness intermittently, symptom resolves after rubbing his face. BP is 106/72, lower than his baseline.  Heart rate 55.  He denies any chest pain, nausea vomiting. He has gained weight, 170 pounds today.    MEDICAL HISTORY:  Past Medical History:  Diagnosis Date   Atrial fibrillation (HCC)    Diabetes mellitus without complication (HCC)    Hypercholesteremia    Hypertension    Multiple myeloma not having achieved remission (HCC)     SURGICAL HISTORY: Past Surgical History:  Procedure Laterality Date   CARDIOVERSION N/A 10/17/2023   Procedure: CARDIOVERSION;  Surgeon: Darliss Rogue, MD;  Location: [facility] ORS;  Service: Cardiovascular;  Laterality: N/A;   COLONOSCOPY N/A 02/13/2023   Procedure: COLONOSCOPY;  Surgeon: Toledo, Ladell POUR, MD;  Location: [facility] ENDOSCOPY;  Service: Gastroenterology;  Laterality: N/A;   COLONOSCOPY N/A 02/14/2023   Procedure: COLONOSCOPY;  Surgeon: Toledo, Ladell POUR, MD;  Location: [facility] ENDOSCOPY;  Service: Gastroenterology;  Laterality: N/A;   ESOPHAGOGASTRODUODENOSCOPY N/A 02/13/2023   Procedure: ESOPHAGOGASTRODUODENOSCOPY (EGD);  Surgeon: [place],  Teodoro K, MD;  Location: [facility] ENDOSCOPY;  Service: Gastroenterology;  Laterality: N/A;   IR BONE MARROW BIOPSY & ASPIRATION  11/24/2023    SOCIAL HISTORY: Social History   Socioeconomic History   Marital status: Married    Spouse name: Not  on file   Number of children: Not on file   Years of education: Not on file   Highest education level: Not on file  Occupational History   Not on file  Tobacco Use   Smoking status: Never   Smokeless tobacco: Never  Vaping Use   Vaping status: Never Used  Substance and Sexual Activity   Alcohol use: Not Currently    Comment: quit approx 2001   Drug use: No   Sexual activity: Not on file  Other Topics Concern   Not on file  Social History Narrative   Not on file   Social Drivers of Health   Financial Resource Strain: Medium Risk (01/13/2024)   Overall Financial Resource Strain (CARDIA)    Difficulty of Paying Living Expenses: Somewhat hard  Food Insecurity: Food Insecurity Present (01/13/2024)   Hunger Vital Sign    Worried About Running Out of Food in the Last Year: Sometimes true    Ran Out of Food in the Last Year: Sometimes true  Transportation Needs: No Transportation Needs (01/13/2024)   PRAPARE - Administrator, Civil Service (Medical): No    Lack of Transportation (Non-Medical): No  Physical Activity: Insufficiently Active (10/12/2023)   Received from [facility]   Exercise Vital Sign    On average, how many days per week do you engage in moderate to strenuous exercise (like a brisk walk)?: 7 days    On average, how many minutes do you engage in exercise at this level?: 10 min  Stress: No Stress Concern Present (10/12/2023)   Received from [facility] - Occupational Stress Questionnaire    Feeling of Stress : Not at all  Recent Concern: Stress - Stress Concern Present (10/04/2023)   Received from [facility] Occupational Health - Occupational Stress Questionnaire    Feeling of Stress : Rather much  Social Connections: Moderately Integrated (10/12/2023)   Received from [facility]   Social Connection and Isolation Panel     In a typical week, how many times do you talk on the phone with family, friends, or neighbors?: More than three times a week    How often do you get together with friends or relatives?: More than three times a week    How often do you attend church or religious services?: 1 to 4 times per year    Do you belong to any clubs or organizations such as church groups, unions, fraternal or athletic groups, or school groups?: No    How often do you attend meetings of the clubs or organizations you belong to?: Never    Are you married, widowed, divorced, separated, never married, or living with a partner?: Married  Intimate Partner Violence: Not At Risk (07/14/2023)   Humiliation, Afraid, Rape, and Kick questionnaire    Fear of Current or Ex-Partner: No    Emotionally Abused: No    Physically Abused: No    Sexually Abused: No    FAMILY HISTORY: Family History  Problem Relation Age of Onset   Diabetes Mother    Heart attack Father     ALLERGIES:  has no known allergies.  MEDICATIONS:  Current Outpatient Medications  Medication Sig Dispense Refill   acetaminophen  (TYLENOL ) 325 MG tablet Take 2 tablets (650 mg total) by mouth every 6 (six) hours as needed for mild pain (or Fever >/= 101). 90 tablet 1   acyclovir  (ZOVIRAX ) 400 MG tablet Take 1 tablet (400 mg total) by mouth 2 (two) times daily. 180 tablet 0   apixaban  (ELIQUIS ) 5 MG TABS tablet Take 1 tablet (5 mg total) by mouth 2 (two) times daily. 60 tablet 5   atorvastatin  (LIPITOR) 10 MG tablet Take 10 mg by mouth daily.     calcium  carbonate (OS-CAL) 600 MG TABS tablet Take 2 tablets (1,200 mg total) by mouth daily.     cholecalciferol (VITAMIN D3) 25 MCG (1000 UNIT) tablet Take 1 tablet (1,000 Units total) by mouth daily.     cyanocobalamin  (VITAMIN B12) 1000 MCG tablet Take 1 tablet (1,000 mcg total) by mouth daily.     dexamethasone  (DECADRON ) 4 MG tablet Take 5 tablets (20 mg) for 1 day on the week after chemotherapy (day 22). 5 tablet  11   glipiZIDE (GLUCOTROL XL) 2.5 MG 24 hr tablet Take 5 mg by mouth daily with breakfast.     loperamide  (IMODIUM ) 2 MG capsule Take 1 capsule (2 mg total) by mouth See admin instructions. With onset of diarrhea, take 4 mg,then 2 mg every 2 hours or after every loose bowel movements  maximum: 16 mg/day 60 capsule 2   metoprolol  succinate (TOPROL -XL) 50 MG 24 hr tablet Take 1.5 tablets (75 mg total) by mouth daily. Take with or immediately following a meal. 135 tablet 3   Multiple Vitamin (MULTIVITAMIN WITH MINERALS) TABS tablet Take 1 tablet by mouth daily. 90 tablet 1   ondansetron  (ZOFRAN ) 8 MG tablet Take 1 tablet (8 mg total) by mouth every 8 (eight) hours as needed for nausea or vomiting. 90 tablet 1   pantoprazole  (PROTONIX ) 40 MG tablet Take 1 tablet (40 mg total) by mouth daily. 30 tablet 1   prochlorperazine  (COMPAZINE ) 10 MG tablet Take 1 tablet (10 mg total) by mouth every 6 (six) hours as needed for nausea or vomiting. 90 tablet 1   selinexor  (XPOVIO , 60 MG ONCE WEEKLY,) Therapy Pack (60 mg once weekly) Take 1 tablet (60 mg total) by mouth once a week. 4 tablet 4   temazepam (RESTORIL) 15 MG capsule Take 15 mg by mouth at bedtime.     No current facility-administered medications for this visit.    Review of Systems  Constitutional:  Positive for fatigue. Negative for appetite change, chills, fever and unexpected weight change.  HENT:   Negative for hearing loss and voice change.   Eyes:  Negative for eye problems and icterus.  Respiratory:  Negative for chest tightness, cough and shortness of breath.   Cardiovascular:  Negative for chest pain and leg swelling.  Gastrointestinal:  Negative for abdominal distention, abdominal pain and diarrhea.  Endocrine: Negative for hot flashes.  Genitourinary:  Negative for difficulty urinating, dysuria and frequency.   Musculoskeletal:  Negative for arthralgias.  Skin:  Negative for itching and rash.  Neurological:  Positive for  light-headedness. Negative for numbness.  Hematological:  Negative for adenopathy. Does not bruise/bleed easily.  Psychiatric/Behavioral:  Positive for sleep disturbance. Negative for confusion.     PHYSICAL EXAMINATION: Vitals:   04/11/24 0845  BP: 106/72  Pulse: (!) 55  Resp: 18  Temp: (!) 96 F (35.6 C)  SpO2: 98%  Filed Weights   04/11/24 0845  Weight: 170 lb 6.4 oz (77.3 kg)    Physical Exam Constitutional:      General: He is not in acute distress.    Appearance: He is obese.  HENT:     Head: Normocephalic and atraumatic.  Eyes:     General: No scleral icterus. Cardiovascular:     Rate and Rhythm: Normal rate.  Pulmonary:     Effort: Pulmonary effort is normal. No respiratory distress.     Breath sounds: No wheezing.  Abdominal:     General: Bowel sounds are normal. There is no distension.     Palpations: Abdomen is soft.  Musculoskeletal:        General: No deformity. Normal range of motion.     Cervical back: Normal range of motion and neck supple.     Right lower leg: No edema.     Left lower leg: No edema.  Skin:    General: Skin is warm and dry.     Findings: No erythema or rash.  Neurological:     Mental Status: He is alert and oriented to person, place, and time. Mental status is at baseline.     Cranial Nerves: No cranial nerve deficit.  Psychiatric:        Mood and Affect: Mood normal.      LABORATORY DATA:  I have reviewed the data as listed    Latest Ref Rng & Units 04/11/2024    8:15 AM 04/04/2024    8:50 AM 03/28/2024    1:10 PM  CBC  WBC 4.0 - 10.5 K/uL 4.3  4.4  5.9   Hemoglobin 13.0 - 17.0 g/dL 9.0  8.5  8.5   Hematocrit 39.0 - 52.0 % 27.9  26.4  25.4   Platelets 150 - 400 K/uL 104  103  90       Latest Ref Rng & Units 04/11/2024    8:15 AM 04/04/2024    8:50 AM 03/28/2024    1:10 PM  CMP  Glucose 70 - 99 mg/dL 851  891  871   BUN 8 - 23 mg/dL 24  22  24    Creatinine 0.61 - 1.24 mg/dL 8.95  8.94  9.14   Sodium 135 - 145  mmol/L 138  134  136   Potassium 3.5 - 5.1 mmol/L 4.0  3.9  3.9   Chloride 98 - 111 mmol/L 108  107  107   CO2 22 - 32 mmol/L 22  21  23    Calcium  8.9 - 10.3 mg/dL 8.5  8.2  8.3   Total Protein 6.5 - 8.1 g/dL 7.0  6.7  7.1   Total Bilirubin 0.0 - 1.2 mg/dL 1.1  1.3  1.2   Alkaline Phos 38 - 126 U/L 88  93  100   AST 15 - 41 U/L 22  22  18    ALT 0 - 44 U/L 29  31  37    Lab Results  Component Value Date   IRON 70 03/28/2024   TIBC 304 03/28/2024   IRONPCTSAT 23 03/28/2024   FERRITIN 535 (H) 03/28/2024     RADIOGRAPHIC STUDIES: I have personally reviewed the radiological images as listed and agreed with the findings in the report. No results found.

## 2024-04-11 NOTE — Assessment & Plan Note (Signed)
 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  Lab Results  Component Value Date   HGB 9.0 (L) 04/11/2024   TIBC 304 03/28/2024   IRONPCTSAT 23 03/28/2024   FERRITIN 535 (H) 03/28/2024    Hemoglobin has improved.

## 2024-04-11 NOTE — Assessment & Plan Note (Signed)
 Encourage oral hydration and avoid nephrotoxins.

## 2024-04-14 ENCOUNTER — Other Ambulatory Visit: Payer: Self-pay

## 2024-04-18 ENCOUNTER — Ambulatory Visit: Admitting: Oncology

## 2024-04-18 ENCOUNTER — Ambulatory Visit

## 2024-04-18 ENCOUNTER — Other Ambulatory Visit

## 2024-04-20 ENCOUNTER — Encounter: Payer: Self-pay | Admitting: Medical

## 2024-04-20 ENCOUNTER — Telehealth: Payer: Self-pay

## 2024-04-20 ENCOUNTER — Ambulatory Visit: Attending: Medical | Admitting: Medical

## 2024-04-20 ENCOUNTER — Other Ambulatory Visit: Payer: Self-pay

## 2024-04-20 VITALS — BP 132/87 | HR 109 | Ht 71.0 in | Wt 171.6 lb

## 2024-04-20 DIAGNOSIS — C9000 Multiple myeloma not having achieved remission: Secondary | ICD-10-CM | POA: Diagnosis not present

## 2024-04-20 DIAGNOSIS — R079 Chest pain, unspecified: Secondary | ICD-10-CM

## 2024-04-20 DIAGNOSIS — I5031 Acute diastolic (congestive) heart failure: Secondary | ICD-10-CM | POA: Diagnosis not present

## 2024-04-20 DIAGNOSIS — I4819 Other persistent atrial fibrillation: Secondary | ICD-10-CM | POA: Diagnosis not present

## 2024-04-20 MED ORDER — FUROSEMIDE 40 MG PO TABS: 40.0000 mg | ORAL_TABLET | Freq: Every day | ORAL | 3 refills | Status: DC

## 2024-04-20 MED ORDER — METOPROLOL SUCCINATE ER 50 MG PO TB24: ORAL_TABLET | ORAL | 1 refills | Status: DC

## 2024-04-20 NOTE — Progress Notes (Signed)
 Cardiology Office Note   Date:  04/20/2024  ID:  Victor Phillips, DOB 1956-05-17, MRN [id number] PCP: Cleotilde Oneil FALCON, MD  [facility] Providers Cardiologist:  Redell Cave, MD Electrophysiologist:  Fonda Kitty, MD   History of Present Illness Victor Phillips is a 68 y.o. male with a history of persistent atrial fibrillation, hypertension, hyperlipidemia, diabetes, multiple myeloma who presents for 16-month follow-up.  Echocardiogram in 2024 showed EF of 50 to 55%.  Cardiac MRI in November 2024 showed EF of 50%, no LGE or LV infiltration.  Patient has a history of A-fib with cardioversion in February 2025 that was successful.  At follow-up he had return to A-fib.  Patient was asymptomatic and continued on rate controlling medication.  EP did consider Tikosyn. Not a good candidate for Amiodarone given MM.   Today, the patient reports dizziness when he stands up, which may be a chronic issue. He does feel heart racing and fluttering at times. He had 1 episode of chest pain 2 weeks ago when sitting, felt a pressure in his chest. No exertional chest pain. He is volume up on exam. He has lasix  but only takes it as needed. BP and HR seem to be labile looking at prior visits. Orthostatics are negative.   Studies Reviewed EKG Interpretation Date/Time:  Friday April 20 2024 11:29:57 EDT Ventricular Rate:  109 PR Interval:    QRS Duration:  90 QT Interval:  362 QTC Calculation: 487 R Axis:   57  Text Interpretation: Atrial fibrillation with rapid ventricular response Nonspecific T wave abnormality When compared with ECG of 20-Jan-2024 09:38, Nonspecific T wave abnormality now evident in Lateral leads Confirmed by Franchester, Tycen Dockter ([id number]) on 04/20/2024 11:58:57 AM    cMRI 07/2023  IMPRESSION: 1.  Normal LV size, low normal systolic function.  LVEF 50%   2.  No LV LGE or scar.   3.  Normal ECV, no evidence for infiltrative disease.   4.  No significant valvular  abnormalities.   5.  Normal RV systolic function.   6.  No evidence for cardiac amyloidosis.  Echo 06/2023 1. Left ventricular ejection fraction, by estimation, is 50 to 55%. The  left ventricle has low normal function. The left ventricle has no regional  wall motion abnormalities. There is mild left ventricular hypertrophy.  Left ventricular diastolic function   could not be evaluated.   2. Right ventricular systolic function is normal. The right ventricular  size is normal. There is moderately elevated pulmonary artery systolic  pressure.   3. Right atrial size was mildly dilated.   4. The mitral valve is normal in structure. Mild mitral valve  regurgitation.   5. The aortic valve is tricuspid. Aortic valve regurgitation is not  visualized. No aortic stenosis is present.   6. The inferior vena cava is normal in size with <50% respiratory  variability, suggesting right atrial pressure of 8 mmHg.          Physical Exam VS:  BP 132/87 (BP Location: Left Arm, Patient Position: Sitting, Cuff Size: Normal)   Pulse (!) 109   Ht 5' 11 (1.803 m)   Wt 171 lb 9.6 oz (77.8 kg)   SpO2 98%   BMI 23.93 kg/m        Wt Readings from Last 3 Encounters:  04/20/24 171 lb 9.6 oz (77.8 kg)  04/11/24 170 lb 6.4 oz (77.3 kg)  04/04/24 166 lb (75.3 kg)    GEN: Well nourished, well developed in no  acute distress NECK: + JVD; No carotid bruits CARDIAC: Irreg Irreg, tachycardia, no murmurs, rubs, gallops RESPIRATORY:  Clear to auscultation without rales, wheezing or rhonchi  ABDOMEN: Soft, non-tender, non-distended EXTREMITIES:  2-3+ lower extremity edema into the thighs; No deformity   ASSESSMENT AND PLAN  Acute diastolic heart failure Patient with volume overload with swelling into his thighs and JVD, suspected 2/2 ?Afib. He remains in Afib with HR 109bpm.  He has lasix  to take as needed. I will check BMET, CBC and BNP. I will start lasix  40mg  daily. BMET in 2 weeks. I will also update an  echocardiogram. Prior echo in 2024 showed LVEF 50-55%. cMRI showed no evidence of infiltrative disease.   Persistent Afib Patient follows with EP. He had DCCV 10/2023 but was back in afib at follow-up. EP considered Tikosyn vs amiodarone. Not a good candidate for amio given MM.continue Eliquis  5mg BID. He reports dizziness and low pressures last 2 weeks. BP today is good and orthostatics are negative. Suspect dizziness 2/2 orthostasis and Afib. We will try Toprol  50mg  in the AM and 50mg  in the PM. We will get him back to see EP.   Multiple myeloma This is followed by oncology.   Chest pain He had 1 episode of chest pressure 2 weeks ago while sitting and no further recurrence. He has no exertional chest pain. He has no prior ischemic work-up. I will start with an echo as above. We will re-evaluate at follow-up.         Dispo: Follow-up with general cards in 1 month  Signed, Elyce Zollinger VEAR Fishman, PA-C

## 2024-04-20 NOTE — Telephone Encounter (Signed)
 After discussion with PA- she recommended Metoprolol  50 mg in the morning and 50 in the evening.   I called patient, made him aware of this change.  Patient verbalized understanding, no questions at this time.

## 2024-04-20 NOTE — Patient Instructions (Addendum)
 Medication Instructions:  START Lasix  40 mg daily  START Toprol  XL 50 in the AM and 25 in the PM   *If you need a refill on your cardiac medications before your next appointment, please call your pharmacy*  Lab Work: Your provider would like for you to have following labs drawn today BMET, BNP, CBC.   If you have labs (blood work) drawn today and your tests are completely normal, you will receive your results only by: MyChart Message (if you have MyChart) OR A paper copy in the mail If you have any lab test that is abnormal or we need to change your treatment, we will call you to review the results.   Your provider would like for you to return in 2 weeks to have the following labs drawn: BMET.   Please go to [facility] [address] ([facility]) [address], [place] [address] You do not need an appointment.  They are open from 8 am- 4:30 pm.  Lunch from 1:00 pm- 2:00 pm You do not need to be fasting.    Testing: Your physician has requested that you have an echocardiogram. Echocardiography is a painless test that uses sound waves to create images of your heart. It provides your doctor with information about the size and shape of your heart and how well your heart's chambers and valves are working.   You may receive an ultrasound enhancing agent through an IV if needed to better visualize your heart during the echo. This procedure takes approximately one hour.  There are no restrictions for this procedure.  This will take place at [address] [facility]) [address], [place] [address]  Please note: We ask at that you not bring children with you during ultrasound (echo/ vascular) testing. Due to room size and safety concerns, children are not allowed in the ultrasound rooms during exams. Our front office staff cannot provide observation of children in our lobby area while testing is being conducted. An adult accompanying a patient to their appointment  will only be allowed in the ultrasound room at the discretion of the ultrasound technician under special circumstances. We apologize for any inconvenience.   Follow-Up: At [facility], you and your health needs are our priority.  As part of our continuing mission to provide you with exceptional heart care, our providers are all part of one team.  This team includes your primary Cardiologist (physician) and Advanced Practice Providers or APPs (Physician Assistants and Nurse Practitioners) who all work together to provide you with the care you need, when you need it.  Your next appointment:   1 month(s)  Provider:   Cadence Franchester, PA-C   SCHEDULED TO SEE CHANTAL NEEDLE, NP on August 25th, 2025. - please keep this upcoming appointment. If anything comes open sooner, we can let you know.  We recommend signing up for the patient portal called MyChart.  Sign up information is provided on this After Visit Summary.  MyChart is used to connect with patients for Virtual Visits (Telemedicine).  Patients are able to view lab/test results, encounter notes, upcoming appointments, etc.  Non-urgent messages can be sent to your provider as well.   To learn more about what you can do with MyChart, go to [email or web address].

## 2024-04-21 ENCOUNTER — Encounter: Payer: Self-pay | Admitting: Oncology

## 2024-04-21 ENCOUNTER — Other Ambulatory Visit: Payer: Self-pay

## 2024-04-21 LAB — CBC
Hematocrit: 29.8 % — ABNORMAL LOW (ref 37.5–51.0)
Hemoglobin: 9.2 g/dL — ABNORMAL LOW (ref 13.0–17.7)
MCH: 36.1 pg — ABNORMAL HIGH (ref 26.6–33.0)
MCHC: 30.9 g/dL — ABNORMAL LOW (ref 31.5–35.7)
MCV: 117 fL — ABNORMAL HIGH (ref 79–97)
Platelets: 128 x10E3/uL — ABNORMAL LOW (ref 150–450)
RBC: 2.55 x10E6/uL — CL (ref 4.14–5.80)
RDW: 12.4 % (ref 11.6–15.4)
WBC: 4.3 x10E3/uL (ref 3.4–10.8)

## 2024-04-21 LAB — BASIC METABOLIC PANEL WITH GFR
BUN/Creatinine Ratio: 20 (ref 10–24)
BUN: 20 mg/dL (ref 8–27)
CO2: 21 mmol/L (ref 20–29)
Calcium: 8.9 mg/dL (ref 8.6–10.2)
Chloride: 106 mmol/L (ref 96–106)
Creatinine, Ser: 1.02 mg/dL (ref 0.76–1.27)
Glucose: 103 mg/dL — ABNORMAL HIGH (ref 70–99)
Potassium: 4.4 mmol/L (ref 3.5–5.2)
Sodium: 142 mmol/L (ref 134–144)
eGFR: 81 mL/min/1.73 (ref >59)

## 2024-04-21 LAB — BRAIN NATRIURETIC PEPTIDE: BNP: 923.4 pg/mL — ABNORMAL HIGH (ref 0.0–100.0)

## 2024-04-23 ENCOUNTER — Ambulatory Visit: Payer: Self-pay | Admitting: Medical

## 2024-04-23 ENCOUNTER — Encounter: Payer: Self-pay | Admitting: Oncology

## 2024-04-24 MED FILL — Dexamethasone Sodium Phosphate Inj 100 MG/10ML: INTRAMUSCULAR | Qty: 2 | Status: AC

## 2024-04-25 ENCOUNTER — Ambulatory Visit

## 2024-04-25 ENCOUNTER — Inpatient Hospital Stay: Attending: Oncology

## 2024-04-25 ENCOUNTER — Other Ambulatory Visit

## 2024-04-25 ENCOUNTER — Inpatient Hospital Stay: Admitting: Oncology

## 2024-04-25 ENCOUNTER — Encounter: Payer: Self-pay | Admitting: Oncology

## 2024-04-25 ENCOUNTER — Inpatient Hospital Stay

## 2024-04-25 VITALS — BP 113/65 | HR 92 | Temp 96.0°F | Resp 18 | Wt 163.2 lb

## 2024-04-25 DIAGNOSIS — C9000 Multiple myeloma not having achieved remission: Secondary | ICD-10-CM | POA: Diagnosis present

## 2024-04-25 DIAGNOSIS — N1832 Chronic kidney disease, stage 3b: Secondary | ICD-10-CM

## 2024-04-25 DIAGNOSIS — Z79899 Other long term (current) drug therapy: Secondary | ICD-10-CM | POA: Diagnosis not present

## 2024-04-25 DIAGNOSIS — T451X5A Adverse effect of antineoplastic and immunosuppressive drugs, initial encounter: Secondary | ICD-10-CM

## 2024-04-25 DIAGNOSIS — D6481 Anemia due to antineoplastic chemotherapy: Secondary | ICD-10-CM | POA: Insufficient documentation

## 2024-04-25 DIAGNOSIS — I4891 Unspecified atrial fibrillation: Secondary | ICD-10-CM | POA: Insufficient documentation

## 2024-04-25 DIAGNOSIS — Z7901 Long term (current) use of anticoagulants: Secondary | ICD-10-CM | POA: Diagnosis not present

## 2024-04-25 DIAGNOSIS — I509 Heart failure, unspecified: Secondary | ICD-10-CM | POA: Insufficient documentation

## 2024-04-25 DIAGNOSIS — I5031 Acute diastolic (congestive) heart failure: Secondary | ICD-10-CM | POA: Insufficient documentation

## 2024-04-25 DIAGNOSIS — N183 Chronic kidney disease, stage 3 unspecified: Secondary | ICD-10-CM | POA: Diagnosis not present

## 2024-04-25 LAB — CBC WITH DIFFERENTIAL (CANCER CENTER ONLY)
Abs Immature Granulocytes: 0.04 K/uL (ref 0.00–0.07)
Basophils Absolute: 0 K/uL (ref 0.0–0.1)
Basophils Relative: 1 %
Eosinophils Absolute: 0.1 K/uL (ref 0.0–0.5)
Eosinophils Relative: 2 %
HCT: 31.2 % — ABNORMAL LOW (ref 39.0–52.0)
Hemoglobin: 9.9 g/dL — ABNORMAL LOW (ref 13.0–17.0)
Immature Granulocytes: 1 %
Lymphocytes Relative: 10 %
Lymphs Abs: 0.5 K/uL — ABNORMAL LOW (ref 0.7–4.0)
MCH: 36.5 pg — ABNORMAL HIGH (ref 26.0–34.0)
MCHC: 31.7 g/dL (ref 30.0–36.0)
MCV: 115.1 fL — ABNORMAL HIGH (ref 80.0–100.0)
Monocytes Absolute: 0.4 K/uL (ref 0.1–1.0)
Monocytes Relative: 7 %
Neutro Abs: 4.1 K/uL (ref 1.7–7.7)
Neutrophils Relative %: 79 %
Platelet Count: 160 K/uL (ref 150–400)
RBC: 2.71 MIL/uL — ABNORMAL LOW (ref 4.22–5.81)
RDW: 13.8 % (ref 11.5–15.5)
WBC Count: 5.2 K/uL (ref 4.0–10.5)
nRBC: 0 % (ref 0.0–0.2)

## 2024-04-25 LAB — CMP (CANCER CENTER ONLY)
ALT: 14 U/L (ref 0–44)
AST: 15 U/L (ref 15–41)
Albumin: 3.5 g/dL (ref 3.5–5.0)
Alkaline Phosphatase: 101 U/L (ref 38–126)
Anion gap: 9 (ref 5–15)
BUN: 21 mg/dL (ref 8–23)
CO2: 28 mmol/L (ref 22–32)
Calcium: 9.4 mg/dL (ref 8.9–10.3)
Chloride: 102 mmol/L (ref 98–111)
Creatinine: 0.98 mg/dL (ref 0.61–1.24)
GFR, Estimated: >60 mL/min (ref >60)
Glucose, Bld: 140 mg/dL — ABNORMAL HIGH (ref 70–99)
Potassium: 4.1 mmol/L (ref 3.5–5.1)
Sodium: 139 mmol/L (ref 135–145)
Total Bilirubin: 0.9 mg/dL (ref 0.0–1.2)
Total Protein: 7.7 g/dL (ref 6.5–8.1)

## 2024-04-25 NOTE — Progress Notes (Signed)
 Nutrition Follow-up:   Patient with multiple myeloma.  Receiving carfilzomib , second line therapy. On hold today.  Met with patient and wife following MD appointment.  Reports that appetite continues to be good and he is eating well.  Says that he does not use salt at home.  Noted swelling recently and lasix  prescribed daily.  Drinks glucerna shake a few times a week.    Seen by cardiology recently  Medications: lasix   Labs: reviewed  Anthropometrics:   Weight 163 lb today (fluid loss) 154 lb 3.2 oz on 6/25 162 lb on 10/13/23   NUTRITION DIAGNOSIS: Unintentional weight loss improving    INTERVENTION:  With good appetite, discussed importance of low sodium diet to help with swelling and CHF.  Low sodium diet handout provided Continue glucerna for added nutrition    MONITORING, EVALUATION, GOAL: weight trends, intake   NEXT VISIT: as needed  Macrina Lehnert B. Dasie SOLON, CSO, LDN Registered Dietitian [phone]

## 2024-04-25 NOTE — Assessment & Plan Note (Signed)
 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  Lab Results  Component Value Date   HGB 9.9 (L) 04/25/2024   TIBC 304 03/28/2024   IRONPCTSAT 23 03/28/2024   FERRITIN 535 (H) 03/28/2024    Hemoglobin has improved.

## 2024-04-25 NOTE — Progress Notes (Signed)
 Hematology/Oncology Progress note [phone] [phone]         Patient Care Team: Cleotilde Oneil FALCON, MD as PCP - General (Internal Medicine) Darliss Rogue, MD as PCP - Cardiology (Cardiology) Kennyth Chew, MD as PCP - Electrophysiology (Cardiology) Babara Call, MD as Consulting Physician (Oncology)  CHIEF COMPLAINTS/REASON FOR VISIT:  Multiple myeloma   ASSESSMENT & PLAN:   Cancer Staging  Multiple myeloma [place]) Staging form: Plasma Cell Myeloma and Plasma Cell Disorders, AJCC 8th Edition - Clinical stage from 04/14/2023: RISS Stage II (Beta-2 -microglobulin (mg/L): 8.4, Albumin (g/dL): 2.6, ISS: Stage III, High-risk cytogenetics: Absent, LDH: Normal) - Signed by Babara Call, MD on 04/22/2023   Multiple myeloma (HCC) Bone marrow biopsy was reviewed. 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  Currently on 2nd line Carfilzomib  D1, 8 15 Q28 days and Selinexor  60mg  Dexamethasone  weekly. M protein responds well.  Cancel cycle 3 D15 Carfilzomib , hold weekly Selinexor  60mg  due to acute CHF Will reach out to [facility] Dr. Darra to discuss next step.    Prophylaxis  Continue Acyclovir , At risk of thrombosis, on Eliquis  5mg  BID for A fib Xegeva monthly if calcium  meets criteria - hold due to hypocalcemia Recommend calcium  and vitamin D  supplementation.    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  Lab Results  Component Value Date   HGB 9.9 (L) 04/25/2024   TIBC 304 03/28/2024   IRONPCTSAT 23 03/28/2024   FERRITIN 535 (H) 03/28/2024    Hemoglobin has improved.   CKD (chronic kidney disease) stage 3, GFR 30-59 ml/min (HCC) Encourage oral hydration and avoid nephrotoxins.     Atrial fibrillation (HCC)  dizziness and low BP have improved.   Follow up with cardiology. I discussed case with Dr. Kennyth and his NP Suzann He is on Eliquis  which he reports being compliant.  He was referred to see EP  Diastolic CHF, acute (HCC) He was seen by cardiology and was recommended to start diuretics   No orders of the defined types were placed in this encounter.  Follow-up  TBD.   We spent sufficient time to discuss many aspect of care, questions were answered to patient's satisfaction.    Call Babara, MD, PhD [facility] Hematology Oncology 04/25/2024     HISTORY OF PRESENTING ILLNESS:  Victor Phillips is a  68 y.o.  male with PMH listed below who was referred to me for anemia  02/11/2023 - 02/14/2023 recent hospitalization due to pneumonia, respiratory failure.  He was found to have a hemoglobin decreased at 6.8, status post PRBC transfusion during admission.  EGD showed gastritis.  Colonoscopy was not remarkable. 02/11/2023 TIBC 221 ferritin 107, iron saturation 16. Patient is currently taking fusion plus Vitamin B12 level in the 300s. His echo showed grade 2 diastolic CHF.  He denies recent chest pain on exertion, shortness of breath on minimal exertion, pre-syncopal episodes, or palpitations He had not noticed any recent bleeding such as epistaxis, hematuria or hematochezia.  He denies over the counter NSAID ingestion.  Oncology History  Multiple myeloma (HCC)  04/14/2023 Initial Diagnosis   Multiple myeloma   03/13/2020 multiple myeloma panel showed M protein 4.3, IgA lambda Free lamda Level 142,-light chain ratio 0.07 04/05/2023 bone marrow biopsy showed  hypercellular bone marrow with plasma cell neoplasm.The bone marrow is hypercellular for age with prominent increase in plasma cells representing 65% of all cells in the aspirate associated with interstitial infiltrates and numerous variably sized aggregates in  the clot and biopsy sections.  The plasma cells  display lambda light chain restriction consistent with plasma cell neoplasm.  Normal cytogenetics.  FISH studies pending.    04/14/2023 Cancer Staging   Staging form: Plasma Cell Myeloma and Plasma Cell Disorders, AJCC 8th Edition - Clinical stage from 04/14/2023: RISS Stage II (Beta-2 -microglobulin (mg/L): 8.4, Albumin (g/dL): 2.6, ISS: Stage III, High-risk cytogenetics: Absent, LDH: Normal) - Signed by Babara Call, MD on 04/22/2023 Stage prefix: Initial diagnosis Beta 2 microglobulin range (mg/L): Greater than or equal to 5.5 Albumin range (g/dL): Less than 3.5 Cytogenetics: 1q addition, Other mutation   04/14/2023 Imaging   Skeletal survey 1. No lytic lesions or other intrinsic bony abnormality. 2. Borderline cardiomegaly with an interval decrease in size. 3. Diffuse atheromatous arterial calcifications including bilateral carotid artery calcifications, right greater than left   04/22/2023 - 11/11/2023 Chemotherapy   Patient is on Treatment Plan : MYELOMA NEWLY DIAGNOSED TRANSPLANT CANDIDATE DaraVRd (Daratumumab  SQ) q21d     04/25/2023 Imaging   PET scan showed  1. Two tiny hypermetabolic lucent lesions in the left posteromedial eighth and ninth ribs. No additional evidence of multiple myeloma. 2. Aortic atherosclerosis (ICD10-I70.0). Coronary artery calcification.   07/14/2023 - 07/19/2023 Hospital Admission   Hospitalized due to ileus.    07/25/2023 Bone Marrow Biopsy   A-C. Peripheral blood and bone marrow, (peripheral smear, aspirate smear, touch preparation, clot section, core biopsy):   Persistent plasma cell myeloma, 50-60% lambda restricted plasma cells on core biopsy. Hypercellular bone marrow (70%) with trilineage hematopoiesis. Negative for increased blasts. Negative for significant dysplasia. Negative for amyloid deposition.   01/25/2024 -  Chemotherapy   Patient is on Treatment Plan : MYELOMA RELAPSED/ REFRACTORY Carfilzomib  D1,8,15 (20/27) + selinexor   + Dexamethasone  (KPd)  q28d       A Fib. on Eliquis  5mg  BID, follows up with cardiology.  Patient reports being compliant on Eliquis .  He was seen by cardiology and started on diuretics.  He reports feeling better now. Dizziness is improved. BP has improved, close to baselien.  denies any chest pain, nausea vomiting. Lost weight, likely due to being on diuretics.     MEDICAL HISTORY:  Past Medical History:  Diagnosis Date   Atrial fibrillation (HCC)    Diabetes mellitus without complication (HCC)    Hypercholesteremia    Hypertension    Multiple myeloma not having achieved remission (HCC)     SURGICAL HISTORY: Past Surgical History:  Procedure Laterality Date   CARDIOVERSION N/A 10/17/2023   Procedure: CARDIOVERSION;  Surgeon: Darliss Rogue, MD;  Location: [facility] ORS;  Service: Cardiovascular;  Laterality: N/A;   COLONOSCOPY N/A 02/13/2023   Procedure: COLONOSCOPY;  Surgeon: Toledo, Ladell POUR, MD;  Location: [facility] ENDOSCOPY;  Service: Gastroenterology;  Laterality: N/A;   COLONOSCOPY N/A 02/14/2023   Procedure: COLONOSCOPY;  Surgeon: Toledo, Ladell POUR, MD;  Location: [facility] ENDOSCOPY;  Service: Gastroenterology;  Laterality: N/A;   ESOPHAGOGASTRODUODENOSCOPY N/A 02/13/2023   Procedure: ESOPHAGOGASTRODUODENOSCOPY (EGD);  Surgeon: Toledo, Ladell POUR, MD;  Location: [facility] ENDOSCOPY;  Service: Gastroenterology;  Laterality: N/A;   IR BONE MARROW BIOPSY & ASPIRATION  11/24/2023    SOCIAL HISTORY: Social History   Socioeconomic History   Marital status: Married    Spouse name: Not on file   Number of  children: Not on file   Years of education: Not on file   Highest education level: Not on file  Occupational History   Not on file  Tobacco Use   Smoking status: Never   Smokeless tobacco: Never  Vaping Use   Vaping status: Never Used  Substance and Sexual Activity   Alcohol use: Not Currently    Comment: quit approx 2001   Drug use: No   Sexual activity: Not on file  Other Topics Concern   Not on  file  Social History Narrative   Not on file   Social Drivers of Health   Financial Resource Strain: Medium Risk (01/13/2024)   Overall Financial Resource Strain (CARDIA)    Difficulty of Paying Living Expenses: Somewhat hard  Food Insecurity: Food Insecurity Present (01/13/2024)   Hunger Vital Sign    Worried About Running Out of Food in the Last Year: Sometimes true    Ran Out of Food in the Last Year: Sometimes true  Transportation Needs: No Transportation Needs (01/13/2024)   PRAPARE - Administrator, Civil Service (Medical): No    Lack of Transportation (Non-Medical): No  Physical Activity: Insufficiently Active (10/12/2023)   Received from [facility]   Exercise Vital Sign    On average, how many days per week do you engage in moderate to strenuous exercise (like a brisk walk)?: 7 days    On average, how many minutes do you engage in exercise at this level?: 10 min  Stress: No Stress Concern Present (10/12/2023)   Received from [facility] of Occupational Health - Occupational Stress Questionnaire    Feeling of Stress : Not at all  Recent Concern: Stress - Stress Concern Present (10/04/2023)   Received from [facility] of Occupational Health - Occupational Stress Questionnaire    Feeling of Stress : Rather much  Social Connections: Moderately Integrated (10/12/2023)   Received from [facility]   Social Connection and Isolation Panel    In a typical week, how many times do you talk on the phone with family, friends, or neighbors?: More than three times a week    How often do you get together with friends or relatives?: More than three times a week    How often do you attend church or religious services?: 1 to 4 times per year    Do you belong to any clubs or organizations such as church groups, unions, fraternal or athletic groups, or school groups?: No    How  often do you attend meetings of the clubs or organizations you belong to?: Never    Are you married, widowed, divorced, separated, never married, or living with a partner?: Married  Intimate Partner Violence: Not At Risk (07/14/2023)   Humiliation, Afraid, Rape, and Kick questionnaire    Fear of Current or Ex-Partner: No    Emotionally Abused: No    Physically Abused: No    Sexually Abused: No    FAMILY HISTORY: Family History  Problem Relation Age of Onset   Diabetes Mother    Heart attack Father     ALLERGIES:  has no known allergies.  MEDICATIONS:  Current Outpatient Medications  Medication Sig Dispense Refill   acetaminophen  (TYLENOL ) 325 MG tablet Take 2 tablets (650 mg total) by mouth every 6 (six) hours as needed for mild pain (or Fever >/= 101). 90 tablet 1   acyclovir  (ZOVIRAX ) 400  MG tablet Take 1 tablet (400 mg total) by mouth 2 (two) times daily. 180 tablet 0   apixaban  (ELIQUIS ) 5 MG TABS tablet Take 1 tablet (5 mg total) by mouth 2 (two) times daily. 60 tablet 5   atorvastatin  (LIPITOR) 10 MG tablet Take 10 mg by mouth daily.     calcium  carbonate (OS-CAL) 600 MG TABS tablet Take 2 tablets (1,200 mg total) by mouth daily.     cholecalciferol (VITAMIN D3) 25 MCG (1000 UNIT) tablet Take 1 tablet (1,000 Units total) by mouth daily.     cyanocobalamin  (VITAMIN B12) 1000 MCG tablet Take 1 tablet (1,000 mcg total) by mouth daily.     dexamethasone  (DECADRON ) 4 MG tablet Take 5 tablets (20 mg) for 1 day on the week after chemotherapy (day 22). 5 tablet 11   furosemide  (LASIX ) 40 MG tablet Take 1 tablet (40 mg total) by mouth daily. 30 tablet 3   glipiZIDE (GLUCOTROL XL) 2.5 MG 24 hr tablet Take 5 mg by mouth daily with breakfast.     loperamide  (IMODIUM ) 2 MG capsule Take 1 capsule (2 mg total) by mouth See admin instructions. With onset of diarrhea, take 4 mg,then 2 mg every 2 hours or after every loose bowel movements  maximum: 16 mg/day 60 capsule 2   metoprolol  succinate  (TOPROL -XL) 50 MG 24 hr tablet Take 1 tablet 50 mg in the morning, and 1 tablet 50 mg in the evening. Take with or immediately following a meal. (Patient taking differently: 25 mg. Take 2 tablet 25 mg in the morning, and 2  tablet 25 mg in the evening. Take with or immediately following a meal.) 180 tablet 1   Multiple Vitamin (MULTIVITAMIN WITH MINERALS) TABS tablet Take 1 tablet by mouth daily. 90 tablet 1   ondansetron  (ZOFRAN ) 8 MG tablet Take 1 tablet (8 mg total) by mouth every 8 (eight) hours as needed for nausea or vomiting. 90 tablet 1   pantoprazole  (PROTONIX ) 40 MG tablet Take 1 tablet (40 mg total) by mouth daily. 30 tablet 1   prochlorperazine  (COMPAZINE ) 10 MG tablet Take 1 tablet (10 mg total) by mouth every 6 (six) hours as needed for nausea or vomiting. 90 tablet 1   selinexor  (XPOVIO , 60 MG ONCE WEEKLY,) Therapy Pack (60 mg once weekly) Take 1 tablet (60 mg total) by mouth once a week. 4 tablet 4   temazepam (RESTORIL) 15 MG capsule Take 15 mg by mouth at bedtime.     No current facility-administered medications for this visit.    Review of Systems  Constitutional:  Positive for fatigue. Negative for appetite change, chills, fever and unexpected weight change.  HENT:   Negative for hearing loss and voice change.   Eyes:  Negative for eye problems and icterus.  Respiratory:  Negative for chest tightness, cough and shortness of breath.   Cardiovascular:  Negative for chest pain and leg swelling.  Gastrointestinal:  Negative for abdominal distention, abdominal pain and diarrhea.  Endocrine: Negative for hot flashes.  Genitourinary:  Negative for difficulty urinating, dysuria and frequency.   Musculoskeletal:  Negative for arthralgias.  Skin:  Negative for itching and rash.  Neurological:  Negative for light-headedness and numbness.  Hematological:  Negative for adenopathy. Does not bruise/bleed easily.  Psychiatric/Behavioral:  Positive for sleep disturbance. Negative for  confusion.     PHYSICAL EXAMINATION: Vitals:   04/25/24 0841  BP: 113/65  Pulse: 92  Resp: 18  Temp: (!) 96 F (35.6 C)  SpO2:  100%   Filed Weights   04/25/24 0841  Weight: 163 lb 3.2 oz (74 kg)    Physical Exam Constitutional:      General: He is not in acute distress.    Appearance: He is obese.  HENT:     Head: Normocephalic and atraumatic.  Eyes:     General: No scleral icterus. Cardiovascular:     Rate and Rhythm: Normal rate.  Pulmonary:     Effort: Pulmonary effort is normal. No respiratory distress.     Breath sounds: No wheezing.  Abdominal:     General: Bowel sounds are normal. There is no distension.     Palpations: Abdomen is soft.  Musculoskeletal:        General: No deformity. Normal range of motion.     Cervical back: Normal range of motion and neck supple.  Skin:    General: Skin is warm and dry.     Findings: No erythema or rash.  Neurological:     Mental Status: He is alert and oriented to person, place, and time. Mental status is at baseline.     Cranial Nerves: No cranial nerve deficit.  Psychiatric:        Mood and Affect: Mood normal.      LABORATORY DATA:  I have reviewed the data as listed    Latest Ref Rng & Units 04/25/2024    8:30 AM 04/20/2024   12:07 PM 04/11/2024    8:15 AM  CBC  WBC 4.0 - 10.5 K/uL 5.2  4.3  4.3   Hemoglobin 13.0 - 17.0 g/dL 9.9  9.2  9.0   Hematocrit 39.0 - 52.0 % 31.2  29.8  27.9   Platelets 150 - 400 K/uL 160  128  104       Latest Ref Rng & Units 04/25/2024    8:30 AM 04/20/2024   12:07 PM 04/11/2024    8:15 AM  CMP  Glucose 70 - 99 mg/dL 859  896  851   BUN 8 - 23 mg/dL 21  20  24    Creatinine 0.61 - 1.24 mg/dL 9.01  8.97  8.95   Sodium 135 - 145 mmol/L 139  142  138   Potassium 3.5 - 5.1 mmol/L 4.1  4.4  4.0   Chloride 98 - 111 mmol/L 102  106  108   CO2 22 - 32 mmol/L 28  21  22    Calcium  8.9 - 10.3 mg/dL 9.4  8.9  8.5   Total Protein 6.5 - 8.1 g/dL 7.7   7.0   Total Bilirubin 0.0 - 1.2 mg/dL  0.9   1.1   Alkaline Phos 38 - 126 U/L 101   88   AST 15 - 41 U/L 15   22   ALT 0 - 44 U/L 14   29    Lab Results  Component Value Date   IRON 70 03/28/2024   TIBC 304 03/28/2024   IRONPCTSAT 23 03/28/2024   FERRITIN 535 (H) 03/28/2024     RADIOGRAPHIC STUDIES: I have personally reviewed the radiological images as listed and agreed with the findings in the report. No results found.

## 2024-04-25 NOTE — Assessment & Plan Note (Signed)
 Encourage oral hydration and avoid nephrotoxins.

## 2024-04-25 NOTE — Assessment & Plan Note (Addendum)
 Bone marrow biopsy was reviewed. 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  Currently on 2nd line Carfilzomib  D1, 8 15 Q28 days and Selinexor  60mg  Dexamethasone  weekly. M protein responds well.  Cancel cycle 3 D15 Carfilzomib , hold weekly Selinexor  60mg  due to acute CHF Will reach out to [facility] Dr. Darra to discuss next step.    Prophylaxis  Continue Acyclovir , At risk of thrombosis, on Eliquis  5mg  BID for A fib Xegeva monthly if calcium  meets criteria - hold due to hypocalcemia Recommend calcium  and vitamin D  supplementation.

## 2024-04-25 NOTE — Assessment & Plan Note (Addendum)
 dizziness and low BP have improved.   Follow up with cardiology. I discussed case with Dr. Kennyth and his NP Suzann He is on Eliquis  which he reports being compliant.  He was referred to see EP

## 2024-04-25 NOTE — Assessment & Plan Note (Signed)
 He was seen by cardiology and was recommended to start diuretics

## 2024-04-26 LAB — KAPPA/LAMBDA LIGHT CHAINS
Kappa free light chain: 2.5 mg/L — ABNORMAL LOW (ref 3.3–19.4)
Kappa, lambda light chain ratio: 0.02 — ABNORMAL LOW (ref 0.26–1.65)
Lambda free light chains: 147.3 mg/L — ABNORMAL HIGH (ref 5.7–26.3)

## 2024-04-27 LAB — MULTIPLE MYELOMA PANEL, SERUM
Albumin SerPl Elph-Mcnc: 3.4 g/dL (ref 2.9–4.4)
Albumin/Glob SerPl: 1 (ref 0.7–1.7)
Alpha 1: 0.3 g/dL (ref 0.0–0.4)
Alpha2 Glob SerPl Elph-Mcnc: 0.8 g/dL (ref 0.4–1.0)
B-Globulin SerPl Elph-Mcnc: 0.9 g/dL (ref 0.7–1.3)
Gamma Glob SerPl Elph-Mcnc: 1.8 g/dL (ref 0.4–1.8)
Globulin, Total: 3.7 g/dL (ref 2.2–3.9)
IgA: 1811 mg/dL — ABNORMAL HIGH (ref 61–437)
IgG (Immunoglobin G), Serum: 483 mg/dL — ABNORMAL LOW (ref 603–1613)
IgM (Immunoglobulin M), Srm: 5 mg/dL — ABNORMAL LOW (ref 20–172)
M Protein SerPl Elph-Mcnc: 1.2 g/dL — ABNORMAL HIGH
Total Protein ELP: 7.1 g/dL (ref 6.0–8.5)

## 2024-05-02 ENCOUNTER — Ambulatory Visit: Admitting: Oncology

## 2024-05-02 ENCOUNTER — Inpatient Hospital Stay

## 2024-05-02 ENCOUNTER — Ambulatory Visit

## 2024-05-02 ENCOUNTER — Other Ambulatory Visit

## 2024-05-07 ENCOUNTER — Telehealth: Payer: Self-pay | Admitting: *Deleted

## 2024-05-07 ENCOUNTER — Ambulatory Visit: Attending: Cardiology | Admitting: Cardiology

## 2024-05-07 ENCOUNTER — Encounter: Payer: Self-pay | Admitting: Cardiology

## 2024-05-07 VITALS — BP 122/72 | HR 88 | Wt 155.2 lb

## 2024-05-07 DIAGNOSIS — D6869 Other thrombophilia: Secondary | ICD-10-CM

## 2024-05-07 DIAGNOSIS — I4819 Other persistent atrial fibrillation: Secondary | ICD-10-CM

## 2024-05-07 MED ORDER — AMIODARONE HCL 200 MG PO TABS: ORAL_TABLET | ORAL | 1 refills | Status: DC

## 2024-05-07 MED ORDER — METOPROLOL SUCCINATE ER 50 MG PO TB24: 50.0000 mg | ORAL_TABLET | Freq: Every day | ORAL | 1 refills | Status: DC

## 2024-05-07 NOTE — Telephone Encounter (Signed)
 Katie with [facility] I can go on if he is going to go back on his SELINEXOR .  Per the nurse of Dr. Babara says going to hold another 2 weeks.  I spoke to a pharmacist named Waddell and she will be checking back on about this medicine in 2 weeks

## 2024-05-07 NOTE — Patient Instructions (Signed)
 Medication Instructions:  Your physician recommends the following medication changes.  START TAKING: Amiodarone  200 mg by mouth twice daily for 14 days, then decrease to 1 tablet by mouth daily thereafter  DECREASE: Metoprolol  to 50 mg by mouth daily  *If you need a refill on your cardiac medications before your next appointment, please call your pharmacy*  Lab Work: Your provider would like for you to have following labs drawn today LFT, T4.     Testing/Procedures: No test ordered today   Follow-Up: At [facility], you and your health needs are our priority.  As part of our continuing mission to provide you with exceptional heart care, our providers are all part of one team.  This team includes your primary Cardiologist (physician) and Advanced Practice Providers or APPs (Physician Assistants and Nurse Practitioners) who all work together to provide you with the care you need, when you need it.  Your next appointment:   4-6 week(s)  Provider:   Dr. Kennyth

## 2024-05-07 NOTE — Progress Notes (Signed)
 Electrophysiology Clinic Note    Date:  05/07/2024  Patient ID:  Victor Phillips, Victor Phillips [birth year], MRN [id number] PCP:  Victor Oneil FALCON, MD  Cardiologist:  Victor Cave, MD   Cardiology APP:  Victor Mikey DEL, PA-C  Electrophysiologist:  Victor Kitty, MD  Electrophysiology APP:  Victor Rizzo, NP      Discussed the use of AI scribe software for clinical note transcription with the patient, who gave verbal consent to proceed.   Patient Profile    Chief Complaint: AFib follow-up  History of Present Illness: Victor Phillips is a 68 y.o. male with PMH notable for persis AFib, HTN, HLD, T2DM, multiple myeloma ; seen today for Victor Kitty, MD for acute visit due to AFib.    I last saw him 12/2023, he was tolerating eliquis  well. He was in AFib during appt. In discussion with Dr. Kitty, recommended rate control given MM.  He has followed up regularly with oncologist where he reported dizziness and borderline BP.  He saw PA Furth earlier this month having palpitations, intermittent chest pain, with fluid overload  On follow-up today, he has lost abotu 15lbs since starting lasix . He is breathing easier with resolution of chest discomfort. He continues to have intermittent dizziness and palpitations with activity. He continues to take eliquis  BID, no bleeding concerns. He does continue to fall regularly.   His wife joins for appt who believes his falls are d/t munchies after taking his sleeping medicine (temazepam), and then he is unsteady on his feet. He uses a cane around the house     Arrhythmia/Device History No specialty comments available.    ROS:  Please see the history of present illness. All other systems are reviewed and otherwise negative.    Physical Exam    VS:  BP 122/72 (BP Location: Left Arm, Patient Position: Sitting, Cuff Size: Normal)   Pulse 88   Wt 155 lb 3.2 oz (70.4 kg)   SpO2 99%   BMI 21.65 kg/m  BMI: Body mass index is 21.65  kg/m.      Wt Readings from Last 3 Encounters:  05/07/24 155 lb 3.2 oz (70.4 kg)  04/25/24 163 lb 3.2 oz (74 kg)  04/20/24 171 lb 9.6 oz (77.8 kg)     GEN- The patient is unkempt appearing, alert and oriented x 3 today.   Lungs- Clear to ausculation bilaterally, normal work of breathing.  Heart- Irregularly irregular rate and rhythm, no murmurs, rubs or gallops Extremities- No peripheral edema, warm, dry    Studies Reviewed   Previous EP, cardiology notes.    EKG is not ordered. Personal review of EKG from 04/20/2024 shows:  AFib w RVR at 109        Cardiac MRI, 07/27/2023 1.  Normal LV size, low normal systolic function.  LVEF 50%  2.  No LV LGE or scar.  3.  Normal ECV, no evidence for infiltrative disease.  4.  No significant valvular abnormalities.  5.  Normal RV systolic function.  6.  No evidence for cardiac amyloidosis.   TTE, 06/14/2023  1. Left ventricular ejection fraction, by estimation, is 50 to 55%. The left ventricle has low normal function. The left ventricle has no regional wall motion abnormalities. There is mild left ventricular hypertrophy. Left ventricular diastolic function  could not be evaluated.   2. Right ventricular systolic function is normal. The right ventricular size is normal. There is moderately elevated pulmonary artery systolic pressure.   3.  Right atrial size was mildly dilated.   4. The mitral valve is normal in structure. Mild mitral valve regurgitation.   5. The aortic valve is tricuspid. Aortic valve regurgitation is not visualized. No aortic stenosis is present.   6. The inferior vena cava is normal in size with <50% respiratory variability, suggesting right atrial pressure of 8 mmHg.    TTE, 02/13/2023  1. Left ventricular ejection fraction, by estimation, is 60 to 65%. The left ventricle has normal function. The left ventricle has no regional wall motion abnormalities. Left ventricular diastolic parameters are consistent with Grade II  diastolic dysfunction (pseudonormalization). Elevated left ventricular end-diastolic pressure. The average left ventricular global longitudinal strain is -19.2 %. The global longitudinal strain is normal.   2. Right ventricular systolic function is normal. The right ventricular size is normal. There is normal pulmonary artery systolic pressure.   3. The mitral valve is normal in structure. Trivial mitral valve regurgitation. No evidence of mitral stenosis.   4. The aortic valve is tricuspid. Aortic valve regurgitation is not visualized. No aortic stenosis is present.   5. There is dilatation of the ascending aorta, measuring 38 mm.   6. The inferior vena cava is dilated in size with >50% respiratory variability, suggesting right atrial pressure of 8 mmHg.      Assessment and Plan     #) persis AFib Ventricular rates intermittently uncontrolled In discussion with his oncologist, amiodarone  will not interact with his chem regimen Will start 200mg  amidoarone BID x 14 days, then reduce to 200mg  daily Reduce toprol  to 50mg  daily Thyroid labs today, recent LFTs stable   #) Hypercoag d/t afib CHA2DS2-VASc Score = at least 3 [CHF History: 0, HTN History: 1, Diabetes History: 1, Stroke History: 0, Vascular Disease History: 0, Age Score: 1, Gender Score: 0].  Therefore, the patient's annual risk of stroke is 3.2 %.    Stroke ppx - 5mg  eliquis  BID, appropriately dosed No bleeding concerns        Current medicines are reviewed at length with the patient today.   The patient does not have concerns regarding his medicines.  The following changes were made today:   REDUCE toprol  to 50mg  daily START amiodarone  200mg  BID x 2 wks, then 200mg  daily  Labs/ tests ordered today include:  Orders Placed This Encounter  Procedures   T4, free   TSH     Disposition: Follow up with Dr. Kennyth or EP APP in 4-6 weeks    Signed, Victor Needle, NP  05/07/24  12:42 PM  Electrophysiology [facility]

## 2024-05-08 ENCOUNTER — Ambulatory Visit: Payer: Self-pay | Admitting: Cardiology

## 2024-05-08 ENCOUNTER — Other Ambulatory Visit: Payer: Self-pay

## 2024-05-08 LAB — T4, FREE: Free T4: 1.44 ng/dL (ref 0.82–1.77)

## 2024-05-08 LAB — TSH: TSH: 1.02 u[IU]/mL (ref 0.450–4.500)

## 2024-05-09 ENCOUNTER — Ambulatory Visit: Admitting: Oncology

## 2024-05-09 ENCOUNTER — Other Ambulatory Visit

## 2024-05-09 ENCOUNTER — Ambulatory Visit

## 2024-05-17 ENCOUNTER — Ambulatory Visit: Attending: Medical

## 2024-05-17 DIAGNOSIS — I4819 Other persistent atrial fibrillation: Secondary | ICD-10-CM | POA: Diagnosis not present

## 2024-05-17 LAB — ECHOCARDIOGRAM COMPLETE
AR max vel: 2.03 cm2
AV Area VTI: 1.94 cm2
AV Area mean vel: 1.98 cm2
AV Mean grad: 4 mmHg
AV Peak grad: 7.6 mmHg
Ao pk vel: 1.38 m/s
Calc EF: 40.4 %
MV M vel: 5.09 m/s
MV Peak grad: 103.4 mmHg
MV VTI: 1.83 cm2
Radius: 0.9 cm
S' Lateral: 4.2 cm
Single Plane A2C EF: 40.5 %
Single Plane A4C EF: 39.3 %

## 2024-05-21 ENCOUNTER — Telehealth: Payer: Self-pay | Admitting: *Deleted

## 2024-05-21 ENCOUNTER — Encounter: Payer: Self-pay | Admitting: Oncology

## 2024-05-21 ENCOUNTER — Other Ambulatory Visit: Payer: Self-pay

## 2024-05-21 DIAGNOSIS — C9000 Multiple myeloma not having achieved remission: Secondary | ICD-10-CM

## 2024-05-21 NOTE — Telephone Encounter (Signed)
 Spoke to [place] at [facility] and informed him that medication is still on hold and pt will be evaluated tomorrow. They will follow up with up on Wednesday.

## 2024-05-21 NOTE — Telephone Encounter (Signed)
 Pharmacy is calling about the medication XPOVIO .  They are asking if this is still on hold for do we send some out,

## 2024-05-22 ENCOUNTER — Encounter: Payer: Self-pay | Admitting: Oncology

## 2024-05-22 ENCOUNTER — Inpatient Hospital Stay: Attending: Oncology

## 2024-05-22 ENCOUNTER — Inpatient Hospital Stay (HOSPITAL_BASED_OUTPATIENT_CLINIC_OR_DEPARTMENT_OTHER): Admitting: Oncology

## 2024-05-22 VITALS — BP 127/72 | HR 89 | Temp 96.0°F | Resp 18 | Wt 159.4 lb

## 2024-05-22 DIAGNOSIS — T451X5A Adverse effect of antineoplastic and immunosuppressive drugs, initial encounter: Secondary | ICD-10-CM

## 2024-05-22 DIAGNOSIS — Z8701 Personal history of pneumonia (recurrent): Secondary | ICD-10-CM | POA: Diagnosis not present

## 2024-05-22 DIAGNOSIS — D6481 Anemia due to antineoplastic chemotherapy: Secondary | ICD-10-CM | POA: Insufficient documentation

## 2024-05-22 DIAGNOSIS — I5031 Acute diastolic (congestive) heart failure: Secondary | ICD-10-CM

## 2024-05-22 DIAGNOSIS — I4891 Unspecified atrial fibrillation: Secondary | ICD-10-CM | POA: Insufficient documentation

## 2024-05-22 DIAGNOSIS — N1832 Chronic kidney disease, stage 3b: Secondary | ICD-10-CM

## 2024-05-22 DIAGNOSIS — C9002 Multiple myeloma in relapse: Secondary | ICD-10-CM | POA: Insufficient documentation

## 2024-05-22 DIAGNOSIS — C9000 Multiple myeloma not having achieved remission: Secondary | ICD-10-CM

## 2024-05-22 DIAGNOSIS — K2970 Gastritis, unspecified, without bleeding: Secondary | ICD-10-CM | POA: Diagnosis not present

## 2024-05-22 LAB — CBC WITH DIFFERENTIAL (CANCER CENTER ONLY)
Abs Immature Granulocytes: 0.2 K/uL — ABNORMAL HIGH (ref 0.00–0.07)
Basophils Absolute: 0 K/uL (ref 0.0–0.1)
Basophils Relative: 1 %
Eosinophils Absolute: 0.1 K/uL (ref 0.0–0.5)
Eosinophils Relative: 2 %
HCT: 28.8 % — ABNORMAL LOW (ref 39.0–52.0)
Hemoglobin: 9.4 g/dL — ABNORMAL LOW (ref 13.0–17.0)
Immature Granulocytes: 4 %
Lymphocytes Relative: 9 %
Lymphs Abs: 0.5 K/uL — ABNORMAL LOW (ref 0.7–4.0)
MCH: 35.5 pg — ABNORMAL HIGH (ref 26.0–34.0)
MCHC: 32.6 g/dL (ref 30.0–36.0)
MCV: 108.7 fL — ABNORMAL HIGH (ref 80.0–100.0)
Monocytes Absolute: 0.5 K/uL (ref 0.1–1.0)
Monocytes Relative: 9 %
Neutro Abs: 4.4 K/uL (ref 1.7–7.7)
Neutrophils Relative %: 75 %
Platelet Count: 172 K/uL (ref 150–400)
RBC: 2.65 MIL/uL — ABNORMAL LOW (ref 4.22–5.81)
RDW: 13.7 % (ref 11.5–15.5)
WBC Count: 5.7 K/uL (ref 4.0–10.5)
nRBC: 0 % (ref 0.0–0.2)

## 2024-05-22 LAB — CMP (CANCER CENTER ONLY)
ALT: 23 U/L (ref 0–44)
AST: 18 U/L (ref 15–41)
Albumin: 3.5 g/dL (ref 3.5–5.0)
Alkaline Phosphatase: 160 U/L — ABNORMAL HIGH (ref 38–126)
Anion gap: 9 (ref 5–15)
BUN: 33 mg/dL — ABNORMAL HIGH (ref 8–23)
CO2: 23 mmol/L (ref 22–32)
Calcium: 9.1 mg/dL (ref 8.9–10.3)
Chloride: 104 mmol/L (ref 98–111)
Creatinine: 1.19 mg/dL (ref 0.61–1.24)
GFR, Estimated: >60 mL/min (ref >60)
Glucose, Bld: 162 mg/dL — ABNORMAL HIGH (ref 70–99)
Potassium: 4.3 mmol/L (ref 3.5–5.1)
Sodium: 136 mmol/L (ref 135–145)
Total Bilirubin: 0.9 mg/dL (ref 0.0–1.2)
Total Protein: 8.3 g/dL — ABNORMAL HIGH (ref 6.5–8.1)

## 2024-05-22 NOTE — Progress Notes (Signed)
 Hematology/Oncology Progress note [phone] [phone]         Patient Care Team: Cleotilde Oneil FALCON, MD as PCP - General (Internal Medicine) Darliss Rogue, MD as PCP - Cardiology (Cardiology) Kennyth Chew, MD as PCP - Electrophysiology (Clinical Cardiac Electrophysiology) Babara Call, MD as Consulting Physician (Oncology) Riddle, Suzann, NP as Nurse Practitioner (Clinical Cardiac Electrophysiology) Franchester Mikey DEL, PA-C as Physician Assistant (Cardiology)  CHIEF COMPLAINTS/REASON FOR VISIT:  Multiple myeloma   ASSESSMENT & PLAN:   Cancer Staging  Multiple myeloma [facility]) Staging form: Plasma Cell Myeloma and Plasma Cell Disorders, AJCC 8th Edition - Clinical stage from 04/14/2023: RISS Stage II (Beta-2 -microglobulin (mg/L): 8.4, Albumin (g/dL): 2.6, ISS: Stage III, High-risk cytogenetics: Absent, LDH: Normal) - Signed by Babara Call, MD on 04/22/2023   Multiple myeloma (HCC) Bone marrow biopsy was reviewed. 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  Previously on 2nd line Carfilzomib  D1, 8 15 Q28 days and Selinexor  60mg  Dexamethasone  weekly. Partial response after 3 cycles.  cycle 3 D15 cancelled due to acute CHF He is doing better. Discussed with cardiology.  One option is to resume on Selinexor  Dexamethasone . He has appointent with [facility] Dr. Marea next week. Appreciate input in next line treatments like BCMA targeted therapies.    Prophylaxis  Continue Acyclovir , At risk of thrombosis, on Eliquis  5mg  BID for A fib Xegeva monthly if calcium  meets criteria - held since CHF, plan to resume.  Recommend calcium  and vitamin D  supplementation.    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 There maybe a component of anemia  of CKD.  Lab Results  Component Value Date   HGB 9.4 (L) 05/22/2024   TIBC 304 03/28/2024   IRONPCTSAT 23 03/28/2024   FERRITIN 535 (H) 03/28/2024    Hemoglobin has improved.   CKD (chronic kidney disease) stage 3, GFR 30-59 ml/min (HCC) Encourage oral hydration and avoid nephrotoxins.    Diastolic CHF, acute (HCC) Compensated. Follow up with cardiology  Atrial fibrillation (HCC) Afib on Amiodarone , anticoagulationm beta blocker..   No orders of the defined types were placed in this encounter.  Follow-up  TBD.   We spent sufficient time to discuss many aspect of care, questions were answered to patient's satisfaction.    Call Babara, MD, PhD [facility] Hematology Oncology 05/22/2024     HISTORY OF PRESENTING ILLNESS:  Victor Phillips is a  68 y.o.  male with PMH listed below who was referred to me for anemia  02/11/2023 - 02/14/2023 recent hospitalization due to pneumonia, respiratory failure.  He was found to have a hemoglobin decreased at 6.8, status post PRBC transfusion during admission.  EGD showed gastritis.  Colonoscopy was not remarkable. 02/11/2023 TIBC 221 ferritin 107, iron saturation 16. Patient is currently taking fusion plus Vitamin B12 level in the 300s. His echo showed grade 2 diastolic CHF.  He denies recent chest pain on exertion, shortness of breath on minimal exertion, pre-syncopal episodes, or palpitations He had not noticed any recent bleeding such as epistaxis, hematuria or hematochezia.  He denies over the counter NSAID ingestion.  Oncology History  Multiple myeloma (HCC)  04/14/2023 Initial Diagnosis   Multiple myeloma   03/13/2020 multiple myeloma panel showed M protein 4.3, IgA lambda Free lamda Level 142,-light chain ratio 0.07 04/05/2023 bone  marrow biopsy showed hypercellular bone marrow with plasma cell neoplasm.The bone marrow is hypercellular for age with prominent increase in plasma cells representing 65% of all cells in the aspirate  associated with interstitial infiltrates and numerous variably sized aggregates in  the clot and biopsy sections.  The plasma cells display lambda light chain restriction consistent with plasma cell neoplasm.  Normal cytogenetics.  FISH studies pending.    04/14/2023 Cancer Staging   Staging form: Plasma Cell Myeloma and Plasma Cell Disorders, AJCC 8th Edition - Clinical stage from 04/14/2023: RISS Stage II (Beta-2 -microglobulin (mg/L): 8.4, Albumin (g/dL): 2.6, ISS: Stage III, High-risk cytogenetics: Absent, LDH: Normal) - Signed by Babara Call, MD on 04/22/2023 Stage prefix: Initial diagnosis Beta 2 microglobulin range (mg/L): Greater than or equal to 5.5 Albumin range (g/dL): Less than 3.5 Cytogenetics: 1q addition, Other mutation   04/14/2023 Imaging   Skeletal survey 1. No lytic lesions or other intrinsic bony abnormality. 2. Borderline cardiomegaly with an interval decrease in size. 3. Diffuse atheromatous arterial calcifications including bilateral carotid artery calcifications, right greater than left   04/22/2023 - 11/11/2023 Chemotherapy   Patient is on Treatment Plan : MYELOMA NEWLY DIAGNOSED TRANSPLANT CANDIDATE DaraVRd (Daratumumab  SQ) q21d     04/25/2023 Imaging   PET scan showed  1. Two tiny hypermetabolic lucent lesions in the left posteromedial eighth and ninth ribs. No additional evidence of multiple myeloma. 2. Aortic atherosclerosis (ICD10-I70.0). Coronary artery calcification.   07/14/2023 - 07/19/2023 Hospital Admission   Hospitalized due to ileus.    07/25/2023 Bone Marrow Biopsy   A-C. Peripheral blood and bone marrow, (peripheral smear, aspirate smear, touch preparation, clot section, core biopsy):   Persistent plasma cell myeloma, 50-60% lambda restricted plasma cells on core biopsy. Hypercellular bone marrow (70%) with trilineage hematopoiesis. Negative for increased blasts. Negative for significant dysplasia. Negative for amyloid deposition.   01/25/2024 -   Chemotherapy   Patient is on Treatment Plan : MYELOMA RELAPSED/ REFRACTORY Carfilzomib  D1,8,15 (20/27) + selinexor   + Dexamethasone  (KPd) q28d       A Fib. on Eliquis  5mg  BID, Amiodarone  and Toprol  , follows up with cardiology.  Patient reports being compliant on Eliquis .  He was seen by cardiology and started on diuretics.  He reports feeling better now. Dizziness is improved. BP has improved, close to baselien. denies any chest pain, nausea vomiting. No new complaints.      MEDICAL HISTORY:  Past Medical History:  Diagnosis Date   Atrial fibrillation (HCC)    Diabetes mellitus without complication (HCC)    Hypercholesteremia    Hypertension    Multiple myeloma not having achieved remission (HCC)     SURGICAL HISTORY: Past Surgical History:  Procedure Laterality Date   CARDIOVERSION N/A 10/17/2023   Procedure: CARDIOVERSION;  Surgeon: Darliss Rogue, MD;  Location: [facility] ORS;  Service: Cardiovascular;  Laterality: N/A;   COLONOSCOPY N/A 02/13/2023   Procedure: COLONOSCOPY;  Surgeon: Toledo, Ladell POUR, MD;  Location: [facility] ENDOSCOPY;  Service: Gastroenterology;  Laterality: N/A;   COLONOSCOPY N/A 02/14/2023   Procedure: COLONOSCOPY;  Surgeon: Toledo, Ladell POUR, MD;  Location: [facility] ENDOSCOPY;  Service: Gastroenterology;  Laterality: N/A;   ESOPHAGOGASTRODUODENOSCOPY N/A 02/13/2023   Procedure: ESOPHAGOGASTRODUODENOSCOPY (EGD);  Surgeon: Toledo, Ladell POUR, MD;  Location: [facility] ENDOSCOPY;  Service: Gastroenterology;  Laterality: N/A;   IR BONE MARROW BIOPSY & ASPIRATION  11/24/2023    SOCIAL HISTORY: Social History   Socioeconomic History   Marital status: Married    Spouse name: Not on file  Number of children: Not on file   Years of education: Not on file   Highest education level: Not on file  Occupational History   Not on file  Tobacco Use   Smoking status: Never   Smokeless tobacco: Never  Vaping Use   Vaping status: Never Used  Substance and Sexual Activity   Alcohol  use: Not Currently    Comment: quit approx 2001   Drug use: No   Sexual activity: Not on file  Other Topics Concern   Not on file  Social History Narrative   Not on file   Social Drivers of Health   Financial Resource Strain: Medium Risk (01/13/2024)   Overall Financial Resource Strain (CARDIA)    Difficulty of Paying Living Expenses: Somewhat hard  Food Insecurity: Food Insecurity Present (01/13/2024)   Hunger Vital Sign    Worried About Running Out of Food in the Last Year: Sometimes true    Ran Out of Food in the Last Year: Sometimes true  Transportation Needs: No Transportation Needs (01/13/2024)   PRAPARE - Administrator, Civil Service (Medical): No    Lack of Transportation (Non-Medical): No  Physical Activity: Insufficiently Active (10/12/2023)   Received from [facility]   Exercise Vital Sign    On average, how many days per week do you engage in moderate to strenuous exercise (like a brisk walk)?: 7 days    On average, how many minutes do you engage in exercise at this level?: 10 min  Stress: No Stress Concern Present (10/12/2023)   Received from [facility] - Occupational Stress Questionnaire    Feeling of Stress : Not at all  Recent Concern: Stress - Stress Concern Present (10/04/2023)   Received from [facility] - Occupational Stress Questionnaire    Feeling of Stress : Rather much  Social Connections: Moderately Integrated (10/12/2023)   Received from [facility]   Social Connection and Isolation Panel    In a typical week, how many times do you talk on the phone with family, friends, or neighbors?: More than three times a week    How often do you get together with friends or relatives?: More than three times a week    How often do you attend church or religious services?: 1 to 4 times per year    Do you  belong to any clubs or organizations such as church groups, unions, fraternal or athletic groups, or school groups?: No    How often do you attend meetings of the clubs or organizations you belong to?: Never    Are you married, widowed, divorced, separated, never married, or living with a partner?: Married  Intimate Partner Violence: Not At Risk (07/14/2023)   Humiliation, Afraid, Rape, and Kick questionnaire    Fear of Current or Ex-Partner: No    Emotionally Abused: No    Physically Abused: No    Sexually Abused: No    FAMILY HISTORY: Family History  Problem Relation Age of Onset   Diabetes Mother    Heart attack Father     ALLERGIES:  has no known allergies.  MEDICATIONS:  Current Outpatient Medications  Medication Sig Dispense Refill   acetaminophen  (TYLENOL ) 325 MG tablet Take 2 tablets (650 mg total) by mouth every 6 (six) hours as needed for mild pain (or Fever >/= 101). 90 tablet 1   acyclovir  (  ZOVIRAX ) 400 MG tablet Take 1 tablet (400 mg total) by mouth 2 (two) times daily. 180 tablet 0   amiodarone  (PACERONE ) 200 MG tablet Take 1 tablet by mouth two times daily for 14 days, then decrease to 1 tablet daily thereafter 60 tablet 1   apixaban  (ELIQUIS ) 5 MG TABS tablet Take 1 tablet (5 mg total) by mouth 2 (two) times daily. 60 tablet 5   atorvastatin  (LIPITOR) 10 MG tablet Take 10 mg by mouth daily.     calcium  carbonate (OS-CAL) 600 MG TABS tablet Take 2 tablets (1,200 mg total) by mouth daily.     cholecalciferol (VITAMIN D3) 25 MCG (1000 UNIT) tablet Take 1 tablet (1,000 Units total) by mouth daily.     cyanocobalamin  (VITAMIN B12) 1000 MCG tablet Take 1 tablet (1,000 mcg total) by mouth daily.     dexamethasone  (DECADRON ) 4 MG tablet Take 5 tablets (20 mg) for 1 day on the week after chemotherapy (day 22). 5 tablet 11   furosemide  (LASIX ) 40 MG tablet Take 1 tablet (40 mg total) by mouth daily. 30 tablet 3   glipiZIDE (GLUCOTROL XL) 2.5 MG 24 hr tablet Take 5 mg by mouth  daily with breakfast.     loperamide  (IMODIUM ) 2 MG capsule Take 1 capsule (2 mg total) by mouth See admin instructions. With onset of diarrhea, take 4 mg,then 2 mg every 2 hours or after every loose bowel movements  maximum: 16 mg/day 60 capsule 2   metoprolol  succinate (TOPROL -XL) 50 MG 24 hr tablet Take 1 tablet (50 mg total) by mouth daily. 180 tablet 1   Multiple Vitamin (MULTIVITAMIN WITH MINERALS) TABS tablet Take 1 tablet by mouth daily. 90 tablet 1   ondansetron  (ZOFRAN ) 8 MG tablet Take 1 tablet (8 mg total) by mouth every 8 (eight) hours as needed for nausea or vomiting. 90 tablet 1   pantoprazole  (PROTONIX ) 40 MG tablet Take 1 tablet (40 mg total) by mouth daily. 30 tablet 1   prochlorperazine  (COMPAZINE ) 10 MG tablet Take 1 tablet (10 mg total) by mouth every 6 (six) hours as needed for nausea or vomiting. 90 tablet 1   selinexor  (XPOVIO , 60 MG ONCE WEEKLY,) Therapy Pack (60 mg once weekly) Take 1 tablet (60 mg total) by mouth once a week. 4 tablet 4   temazepam (RESTORIL) 15 MG capsule Take 15 mg by mouth at bedtime.     No current facility-administered medications for this visit.    Review of Systems  Constitutional:  Positive for fatigue. Negative for appetite change, chills, fever and unexpected weight change.  HENT:   Negative for hearing loss and voice change.   Eyes:  Negative for eye problems and icterus.  Respiratory:  Negative for chest tightness, cough and shortness of breath.   Cardiovascular:  Negative for chest pain and leg swelling.  Gastrointestinal:  Negative for abdominal distention, abdominal pain and diarrhea.  Endocrine: Negative for hot flashes.  Genitourinary:  Negative for difficulty urinating, dysuria and frequency.   Musculoskeletal:  Negative for arthralgias.  Skin:  Negative for itching and rash.  Neurological:  Negative for light-headedness and numbness.  Hematological:  Negative for adenopathy. Does not bruise/bleed easily.  Psychiatric/Behavioral:   Positive for sleep disturbance. Negative for confusion.     PHYSICAL EXAMINATION: Vitals:   05/22/24 0833 05/22/24 0847  BP: (!) 145/97 127/72  Pulse: 89   Resp: 18   Temp: (!) 96 F (35.6 C)   SpO2: 97%    Filed Weights  05/22/24 0833  Weight: 159 lb 6.4 oz (72.3 kg)    Physical Exam Constitutional:      General: He is not in acute distress.    Appearance: He is obese.  HENT:     Head: Normocephalic and atraumatic.  Eyes:     General: No scleral icterus. Cardiovascular:     Rate and Rhythm: Normal rate.  Pulmonary:     Effort: Pulmonary effort is normal. No respiratory distress.     Breath sounds: No wheezing.  Abdominal:     General: Bowel sounds are normal. There is no distension.     Palpations: Abdomen is soft.  Musculoskeletal:        General: No deformity. Normal range of motion.     Cervical back: Normal range of motion and neck supple.     Comments: Trace edema bilateral lower extremities.   Skin:    General: Skin is warm and dry.     Findings: No erythema or rash.  Neurological:     Mental Status: He is alert and oriented to person, place, and time. Mental status is at baseline.     Cranial Nerves: No cranial nerve deficit.  Psychiatric:        Mood and Affect: Mood normal.      LABORATORY DATA:  I have reviewed the data as listed    Latest Ref Rng & Units 05/22/2024    8:26 AM 04/25/2024    8:30 AM 04/20/2024   12:07 PM  CBC  WBC 4.0 - 10.5 K/uL 5.7  5.2  4.3   Hemoglobin 13.0 - 17.0 g/dL 9.4  9.9  9.2   Hematocrit 39.0 - 52.0 % 28.8  31.2  29.8   Platelets 150 - 400 K/uL 172  160  128       Latest Ref Rng & Units 05/22/2024    8:26 AM 04/25/2024    8:30 AM 04/20/2024   12:07 PM  CMP  Glucose 70 - 99 mg/dL 837  859  896   BUN 8 - 23 mg/dL 33  21  20   Creatinine 0.61 - 1.24 mg/dL 8.80  9.01  8.97   Sodium 135 - 145 mmol/L 136  139  142   Potassium 3.5 - 5.1 mmol/L 4.3  4.1  4.4   Chloride 98 - 111 mmol/L 104  102  106   CO2 22 - 32 mmol/L  23  28  21    Calcium  8.9 - 10.3 mg/dL 9.1  9.4  8.9   Total Protein 6.5 - 8.1 g/dL 8.3  7.7    Total Bilirubin 0.0 - 1.2 mg/dL 0.9  0.9    Alkaline Phos 38 - 126 U/L 160  101    AST 15 - 41 U/L 18  15    ALT 0 - 44 U/L 23  14     Lab Results  Component Value Date   IRON 70 03/28/2024   TIBC 304 03/28/2024   IRONPCTSAT 23 03/28/2024   FERRITIN 535 (H) 03/28/2024     RADIOGRAPHIC STUDIES: I have personally reviewed the radiological images as listed and agreed with the findings in the report. ECHOCARDIOGRAM COMPLETE Result Date: 05/17/2024    ECHOCARDIOGRAM REPORT   Patient Name:   Victor Phillips Date of Exam: 05/17/2024 Medical Rec #:  [id number]        Height:       71.0 in Accession #:    [id number]       Weight:  155.2 lb Date of Birth:  1956-01-06       BSA:          1.893 m Patient Age:    67 years         BP:           122/72 mmHg Patient Gender: M                HR:           94 bpm. Exam Location:  [place] Procedure: 2D Echo, Cardiac Doppler and Color Doppler (Both Spectral and Color            Flow Doppler were utilized during procedure). Indications:    I48.91* Unspeicified atrial fibrillation  History:        Patient has prior history of Echocardiogram examinations, most                 recent 06/14/2023. CHF, Arrythmias:Atrial Fibrillation,                 Signs/Symptoms:Edema; Risk Factors:Hypertension, Diabetes,                 Dyslipidemia and Non-Smoker.  Sonographer:    Damien Drones Referring Phys: [id number] CADENCE H FURTH IMPRESSIONS  1. Left ventricular ejection fraction, by estimation, is 30 to 35%. The left ventricle has moderately decreased function. The left ventricle demonstrates global hypokinesis. The left ventricular internal cavity size was moderately dilated. Left ventricular diastolic parameters are indeterminate.  2. Right ventricular systolic function is moderately reduced. The right ventricular size is normal. There is severely elevated pulmonary artery  systolic pressure. The estimated right ventricular systolic pressure is 73.1 mmHg.  3. Left atrial size was mildly dilated.  4. Right atrial size was mildly dilated.  5. The mitral valve is normal in structure. Mild mitral valve regurgitation. No evidence of mitral stenosis.  6. Tricuspid valve regurgitation is moderate.  7. The aortic valve is normal in structure. Aortic valve regurgitation is not visualized. No aortic stenosis is present.  8. The inferior vena cava is dilated in size with <50% respiratory variability, suggesting right atrial pressure of 15 mmHg. FINDINGS  Left Ventricle: Left ventricular ejection fraction, by estimation, is 30 to 35%. The left ventricle has moderately decreased function. The left ventricle demonstrates global hypokinesis. Strain was performed and the global longitudinal strain is indeterminate. The left ventricular internal cavity size was moderately dilated. There is no left ventricular hypertrophy. Left ventricular diastolic parameters are indeterminate. Right Ventricle: The right ventricular size is normal. No increase in right ventricular wall thickness. Right ventricular systolic function is moderately reduced. There is severely elevated pulmonary artery systolic pressure. The tricuspid regurgitant velocity is 3.81 m/s, and with an assumed right atrial pressure of 15 mmHg, the estimated right ventricular systolic pressure is 73.1 mmHg. Left Atrium: Left atrial size was mildly dilated. Right Atrium: Right atrial size was mildly dilated. Pericardium: There is no evidence of pericardial effusion. Mitral Valve: The mitral valve is normal in structure. Mild mitral valve regurgitation. No evidence of mitral valve stenosis. MV peak gradient, 7.5 mmHg. The mean mitral valve gradient is 3.0 mmHg. Tricuspid Valve: The tricuspid valve is normal in structure. Tricuspid valve regurgitation is moderate . No evidence of tricuspid stenosis. Aortic Valve: The aortic valve is normal in  structure. Aortic valve regurgitation is not visualized. No aortic stenosis is present. Aortic valve mean gradient measures 4.0 mmHg. Aortic valve peak gradient measures 7.6 mmHg. Aortic valve area, by  VTI measures 1.94 cm. Pulmonic Valve: The pulmonic valve was normal in structure. Pulmonic valve regurgitation is not visualized. No evidence of pulmonic stenosis. Aorta: The aortic root is normal in size and structure. Venous: The inferior vena cava is dilated in size with less than 50% respiratory variability, suggesting right atrial pressure of 15 mmHg. IAS/Shunts: No atrial level shunt detected by color flow Doppler. Additional Comments: 3D was performed not requiring image post processing on an independent workstation and was indeterminate.  LEFT VENTRICLE PLAX 2D LVIDd:         5.20 cm LVIDs:         4.20 cm LV PW:         1.00 cm LV IVS:        1.00 cm LVOT diam:     2.20 cm LV SV:         55 LV SV Index:   29 LVOT Area:     3.80 cm  LV Volumes (MOD) LV vol d, MOD A2C: 117.0 ml LV vol d, MOD A4C: 113.0 ml LV vol s, MOD A2C: 69.6 ml LV vol s, MOD A4C: 68.6 ml LV SV MOD A2C:     47.4 ml LV SV MOD A4C:     113.0 ml LV SV MOD BP:      47.1 ml RIGHT VENTRICLE            IVC RV Basal diam:  3.80 cm    IVC diam: 2.30 cm RV Mid diam:    2.70 cm RV S prime:     6.23 cm/s TAPSE (M-mode): 1.0 cm LEFT ATRIUM             Index        RIGHT ATRIUM           Index LA diam:        4.70 cm 2.48 cm/m   RA Area:     22.30 cm LA Vol (A2C):   83.2 ml 43.95 ml/m  RA Volume:   70.20 ml  37.08 ml/m LA Vol (A4C):   77.2 ml 40.78 ml/m LA Biplane Vol: 83.6 ml 44.16 ml/m  AORTIC VALVE                    PULMONIC VALVE AV Area (Vmax):    2.03 cm     PV Vmax:       0.73 m/s AV Area (Vmean):   1.98 cm     PV Peak grad:  2.1 mmHg AV Area (VTI):     1.94 cm AV Vmax:           138.00 cm/s AV Vmean:          97.000 cm/s AV VTI:            0.286 m AV Peak Grad:      7.6 mmHg AV Mean Grad:      4.0 mmHg LVOT Vmax:         73.70 cm/s  LVOT Vmean:        50.400 cm/s LVOT VTI:          0.146 m LVOT/AV VTI ratio: 0.51  AORTA Ao Root diam: 3.50 cm Ao Asc diam:  3.70 cm MITRAL VALVE                  TRICUSPID VALVE MV Area VTI:  1.83 cm        TR Peak grad:   58.1 mmHg MV  Peak grad: 7.5 mmHg        TR Vmax:        381.00 cm/s MV Mean grad: 3.0 mmHg MV Vmax:      1.37 m/s        SHUNTS MV Vmean:     82.5 cm/s       Systemic VTI:  0.15 m MR Peak grad:    103.4 mmHg   Systemic Diam: 2.20 cm MR Vmax:         508.50 cm/s MR PISA:         5.09 cm MR PISA Eff ROA: 39 mm MR PISA Radius:  0.90 cm Evalene Lunger MD Electronically signed by Evalene Lunger MD Signature Date/Time: 05/17/2024/6:34:21 PM    Final

## 2024-05-22 NOTE — Assessment & Plan Note (Signed)
 Encourage oral hydration and avoid nephrotoxins.

## 2024-05-22 NOTE — Assessment & Plan Note (Addendum)
 Bone marrow biopsy was reviewed. 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  Previously on 2nd line Carfilzomib  D1, 8 15 Q28 days and Selinexor  60mg  Dexamethasone  weekly. Partial response after 3 cycles.  cycle 3 D15 cancelled due to acute CHF He is doing better. Discussed with cardiology.  One option is to resume on Selinexor  Dexamethasone . He has appointent with [facility] Dr. Marea next week. Appreciate input in next line treatments like BCMA targeted therapies.    Prophylaxis  Continue Acyclovir , At risk of thrombosis, on Eliquis  5mg  BID for A fib Xegeva monthly if calcium  meets criteria - held since CHF, plan to resume.  Recommend calcium  and vitamin D  supplementation.

## 2024-05-22 NOTE — Assessment & Plan Note (Addendum)
 Afib on Amiodarone , anticoagulationm beta blocker.SABRA

## 2024-05-22 NOTE — Assessment & Plan Note (Signed)
 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  Lab Results  Component Value Date   HGB 9.4 (L) 05/22/2024   TIBC 304 03/28/2024   IRONPCTSAT 23 03/28/2024   FERRITIN 535 (H) 03/28/2024    Hemoglobin has improved.

## 2024-05-22 NOTE — Assessment & Plan Note (Addendum)
 Improved. On lasix .  Follow up with cardiology

## 2024-05-23 ENCOUNTER — Telehealth: Payer: Self-pay | Admitting: *Deleted

## 2024-05-23 LAB — KAPPA/LAMBDA LIGHT CHAINS
Kappa free light chain: 3.6 mg/L (ref 3.3–19.4)
Kappa, lambda light chain ratio: 0.01 — ABNORMAL LOW (ref 0.26–1.65)
Lambda free light chains: 326.9 mg/L — ABNORMAL HIGH (ref 5.7–26.3)

## 2024-05-23 NOTE — Progress Notes (Unsigned)
 Cardiology Office Note   Date:  05/25/2024  ID:  Victor Phillips, DOB 01-08-1956, MRN [id number] PCP: Cleotilde Oneil FALCON, MD  [facility] Providers Cardiologist:  Redell Cave, MD Cardiology APP:  [place] Mikey DEL, PA-C  Electrophysiologist:  Fonda Kitty, MD  Electrophysiology APP:  Riddle, Suzann, NP    History of Present Illness Victor Phillips is a 68 y.o. male with a history of persistent atrial fibrillation, hypertension, hyperlipidemia, diabetes, multiple myeloma, HFrEF who presents for  follow-up Afib and HFrEF.   Echocardiogram in 2024 showed EF of 50 to 55%.  Cardiac MRI in November 2024 showed EF of 50%, no LGE or LV infiltration.   Patient has a history of A-fib with cardioversion in February 2025 that was successful.  At follow-up he had return to A-fib.  Patient was asymptomatic and continued on rate controlling medication.  EP did consider Tikosyn. Not a good candidate for Amiodarone  given MM. Since his rates were OK, no changes were made.   Patient was seen 04/20/2024 reporting dizziness when he stands up.  Orthostatics were negative.  Patient was volume overloaded with swelling into the thighs and JVD suspected secondary to A-fib.  He was in A-fib with heart rate of 109 bpm.  He was started on Lasix  40 mg daily.  Echocardiogram was ordered.  He was referred back to EP to consider AA options.  He was subsequently started on amiodarone .  Echocardiogram showed reduced pump function of 30 to 35%, moderately reduced RV SF, mild MR, moderate TR.  Today, the patient remains in Afib with heart rate of 102bpm. He reports he is overall feeling better. He does have lightheadedness when he walks. Orthostatics are negative. No chest pain. He has SOB when he walks a long distance. Swelling has resolved with lasix .   Studies Reviewed EKG Interpretation Date/Time:  Friday May 25 2024 09:49:13 EDT Ventricular Rate:  102 PR Interval:    QRS Duration:  96 QT  Interval:  372 QTC Calculation: 484 R Axis:   42  Text Interpretation: Atrial fibrillation with rapid ventricular response Abnormal QRS-T angle, consider primary T wave abnormality When compared with ECG of 20-Apr-2024 11:29, Nonspecific T wave abnormality no longer evident in Lateral leads Confirmed by Franchester, Carsten Carstarphen ([id number]) on 05/25/2024 10:01:31 AM    Echo 05/2024 1. Left ventricular ejection fraction, by estimation, is 30 to 35%. The  left ventricle has moderately decreased function. The left ventricle  demonstrates global hypokinesis. The left ventricular internal cavity size  was moderately dilated. Left  ventricular diastolic parameters are indeterminate.   2. Right ventricular systolic function is moderately reduced. The right  ventricular size is normal. There is severely elevated pulmonary artery  systolic pressure. The estimated right ventricular systolic pressure is  73.1 mmHg.   3. Left atrial size was mildly dilated.   4. Right atrial size was mildly dilated.   5. The mitral valve is normal in structure. Mild mitral valve  regurgitation. No evidence of mitral stenosis.   6. Tricuspid valve regurgitation is moderate.   7. The aortic valve is normal in structure. Aortic valve regurgitation is  not visualized. No aortic stenosis is present.   8. The inferior vena cava is dilated in size with <50% respiratory  variability, suggesting right atrial pressure of 15 mmHg.   cMRI 07/2023  IMPRESSION: 1.  Normal LV size, low normal systolic function.  LVEF 50%   2.  No LV LGE or scar.   3.  Normal ECV,  no evidence for infiltrative disease.   4.  No significant valvular abnormalities.   5.  Normal RV systolic function.   6.  No evidence for cardiac amyloidosis.   Echo 06/2023 1. Left ventricular ejection fraction, by estimation, is 50 to 55%. The  left ventricle has low normal function. The left ventricle has no regional  wall motion abnormalities. There is mild left  ventricular hypertrophy.  Left ventricular diastolic function   could not be evaluated.   2. Right ventricular systolic function is normal. The right ventricular  size is normal. There is moderately elevated pulmonary artery systolic  pressure.   3. Right atrial size was mildly dilated.   4. The mitral valve is normal in structure. Mild mitral valve  regurgitation.   5. The aortic valve is tricuspid. Aortic valve regurgitation is not  visualized. No aortic stenosis is present.   6. The inferior vena cava is normal in size with <50% respiratory  variability, suggesting right atrial pressure of 8 mmHg.          Physical Exam VS:  BP 110/72 (BP Location: Left Arm, Patient Position: Sitting, Cuff Size: Normal)   Pulse (!) 102   Ht 5' 11 (1.803 m)   Wt 158 lb (71.7 kg)   SpO2 96%   BMI 22.04 kg/m   Orthostatic VS for the past 24 hrs (Last 3 readings):  BP- Lying Pulse- Lying BP- Sitting Pulse- Sitting BP- Standing at 0 minutes Pulse- Standing at 0 minutes BP- Standing at 3 minutes Pulse- Standing at 3 minutes  05/25/24 0953 145/80 80 133/79 101 136/66 100 145/74 96      Wt Readings from Last 3 Encounters:  05/25/24 158 lb (71.7 kg)  05/22/24 159 lb 6.4 oz (72.3 kg)  05/07/24 155 lb 3.2 oz (70.4 kg)    GEN: Well nourished, well developed in no acute distress NECK: No JVD; No carotid bruits CARDIAC: Irregularly irregular, no murmurs, rubs, gallops RESPIRATORY:  Clear to auscultation without rales, wheezing or rhonchi  ABDOMEN: Soft, non-tender, non-distended EXTREMITIES:  No edema; No deformity   ASSESSMENT AND PLAN  Persistent Afib Patient reports dizziness suspected secondary to A-fib.  Orthostatics are negative today.  Patient saw EP who started amiodarone . EKG today shows A-fib with a heart rate of 102 bpm.  Next week he will go down to amiodarone  200 mg daily.  He stopped Toprol , I recommend we restart at a low dose. Patient says he feels better overall.  Continue Eliquis  5  mg twice daily.  Patient has follow-up with EP next month.  If he remains in A-fib may need to consider cardioversion.  Hopefully patient will self convert to normal sinus rhythm.  Systolic heart failure Recent echo showed reduced LVEF 30-35%, suspected 2/2 to Afib, however cannot exclude ischemia.  Patient reports resolution of swelling on Lasix .  Patient reports he has not been taking Toprol .  I will start Toprol  12.5 mg daily and spironolactone  12.5 mg daily.  We discussed Jardiance , however he said he cannot afford this in the past.  We will order Jardiance  10 mg daily and apply for patient assistance program.  Will continue GDMT as able and recheck echocardiogram once normal sinus rhythm has been established. He would also benefit from a non-invasive ischemic evaluation at some point given low EF. BMET in 2 weeks.   Multiple myeloma He is followed by oncology.   Chest pain No further chest pain reported at this time.  Echo showed low EF as  above, this is suspected secondary to A-fib, however he has not had prior ischemic workup. He is not a good candidate for heart cath. Will consider cardiac CTA once heart rate has improved. No ASA given Eliquis . Continue Lipitor and Toprol .      Dispo: Follow-up in 2 months  Signed, Marcile Fuquay VEAR Fishman, PA-C

## 2024-05-23 NOTE — Telephone Encounter (Signed)
 Jason Onco [address] is checking up on whether or not the patient is going to get the Xpovio . Was seen yesterday and in the  MD notes says:One option is to resume on Selinexor  Dexamethasone .

## 2024-05-24 ENCOUNTER — Encounter: Payer: Self-pay | Admitting: Oncology

## 2024-05-24 LAB — MULTIPLE MYELOMA PANEL, SERUM
Albumin SerPl Elph-Mcnc: 3.4 g/dL (ref 2.9–4.4)
Albumin/Glob SerPl: 0.8 (ref 0.7–1.7)
Alpha 1: 0.2 g/dL (ref 0.0–0.4)
Alpha2 Glob SerPl Elph-Mcnc: 0.7 g/dL (ref 0.4–1.0)
B-Globulin SerPl Elph-Mcnc: 1.1 g/dL (ref 0.7–1.3)
Gamma Glob SerPl Elph-Mcnc: 2.4 g/dL — ABNORMAL HIGH (ref 0.4–1.8)
Globulin, Total: 4.4 g/dL — ABNORMAL HIGH (ref 2.2–3.9)
IgA: 2866 mg/dL — ABNORMAL HIGH (ref 61–437)
IgG (Immunoglobin G), Serum: 374 mg/dL — ABNORMAL LOW (ref 603–1613)
IgM (Immunoglobulin M), Srm: 7 mg/dL — ABNORMAL LOW (ref 20–172)
M Protein SerPl Elph-Mcnc: 1.7 g/dL — ABNORMAL HIGH
Total Protein ELP: 7.8 g/dL (ref 6.0–8.5)

## 2024-05-24 NOTE — Telephone Encounter (Signed)
 Called [phone] and spoke to [place]. Informed her that pt's Xpovio  is still on hold and pt will be seen by [facility] on 9/15. Determination on whether pt should continue Xpovio  will be made after that visit.

## 2024-05-25 ENCOUNTER — Other Ambulatory Visit: Payer: Self-pay

## 2024-05-25 ENCOUNTER — Encounter: Payer: Self-pay | Admitting: Medical

## 2024-05-25 ENCOUNTER — Ambulatory Visit: Attending: Medical | Admitting: Medical

## 2024-05-25 VITALS — BP 110/72 | HR 102 | Ht 71.0 in | Wt 158.0 lb

## 2024-05-25 DIAGNOSIS — R079 Chest pain, unspecified: Secondary | ICD-10-CM

## 2024-05-25 DIAGNOSIS — I4819 Other persistent atrial fibrillation: Secondary | ICD-10-CM | POA: Diagnosis not present

## 2024-05-25 DIAGNOSIS — C9000 Multiple myeloma not having achieved remission: Secondary | ICD-10-CM | POA: Diagnosis not present

## 2024-05-25 DIAGNOSIS — I5022 Chronic systolic (congestive) heart failure: Secondary | ICD-10-CM | POA: Diagnosis not present

## 2024-05-25 MED ORDER — EMPAGLIFLOZIN 10 MG PO TABS: 10.0000 mg | ORAL_TABLET | Freq: Every day | ORAL | 2 refills | Status: AC

## 2024-05-25 MED ORDER — SPIRONOLACTONE 25 MG PO TABS: 12.5000 mg | ORAL_TABLET | Freq: Every day | ORAL | 1 refills | Status: AC

## 2024-05-25 MED ORDER — METOPROLOL SUCCINATE ER 25 MG PO TB24: 12.5000 mg | ORAL_TABLET | Freq: Every day | ORAL | 1 refills | Status: AC

## 2024-05-25 NOTE — Patient Instructions (Addendum)
 Medication Instructions:  Your physician recommends the following medication changes.  START TAKING: START Metoprolol  (Toprol ) 12.5 mg once daily (half of the 25 mg tablet) START Jardiance  10 mg once daily START Spironolactone  12.5 mg once daily (half of the 25 mg tablet)  *If you need a refill on your cardiac medications before your next appointment, please call your pharmacy*  Lab Work: Your provider would like for you to return in 2 weeks to have the following labs drawn: BMET.   Please go to [facility] [address] ([facility] [address], [place] [address] You do not need an appointment.  They are open from 8 am- 4:30 pm.  Lunch from 1:00 pm- 2:00 pm You will not need to be fasting.   You may also go to one of the following [facility]:  [address] [place], [place] [address] Phone: [phone] Lab hours: Mon-Fri 8 am- 5 pm    Lunch 12 pm- 1 pm  [address] [place],  [place]  [address]  [place]  Phone: [phone] Lab hours: 7 am- 4 pm Lunch 12 pm-1 pm   [address] [place],  [place]  [address]  [place]  Phone: [phone] Lab hours: Mon-Fri 8 am- 5 pm    Lunch 12 pm- 1 pm  If you have labs (blood work) drawn today and your tests are completely normal, you will receive your results only by: MyChart Message (if you have MyChart) OR A paper copy in the mail If you have any lab test that is abnormal or we need to change your treatment, we will call you to review the results.  Testing/Procedures: None ordered  Follow-Up: At [facility], you and your health needs are our priority.  As part of our continuing mission to provide you with exceptional heart care, our providers are all part of one team.  This team includes your primary Cardiologist (physician) and Advanced Practice Providers or APPs (Physician Assistants and Nurse Practitioners) who all work together to provide you with the care you need, when you need it.  Your next appointment:    2 month(s)  Provider:   You may see Redell Cave, MD or one of the following Advanced Practice Providers on your designated Care Team:   Cadence Franchester, [place]   We recommend signing up for the patient portal called MyChart.  Sign up information is provided on this After Visit Summary.  MyChart is used to connect with patients for Virtual Visits (Telemedicine).  Patients are able to view lab/test results, encounter notes, upcoming appointments, etc.  Non-urgent messages can be sent to your provider as well.   To learn more about what you can do with MyChart, go to [email or web address].

## 2024-05-28 NOTE — Progress Notes (Signed)
 ABMT INTERVAL NOTE  IDENTIFYING STATEMENT:  Victor Phillips is a 68 y.o. male, with a date of birth of Oct 03, 1955 from [facility] with the diagnosis of IgA lamba multiple myeloma who was seen at the request of Dr. Zelphia Cap and is here for evaluation for auto-SCT.   HPI:  [name] has a history of myeloma as outlined in the Impression section of this note.  He was seen in our clinic on 07/04/23 and felt to be a candidate for high dose therapy if his myeloma was adequately controlled.  He returned on 07/25/23 and labs revealed 1.20 and 0.06 g/dL M-spikes with a biclonal IgA lambda and IgG kappa on IFE, IgA 1410 mg/dL and serum free kappa light chain level 1.04 mg/dL, lambda 0.96 mg/dL and ratio 0.12.  A bone marrow revealed 50-50% plasma cells wth +1q, +19p, del(13q) and del(4p) on FISH and complex cytogenetics with a net gain of 1q.  CT skeletal survey showed no bone lesions.  A cardiac MRI on 07/27/23 revealed LVEF 50% and no evidence of amyloid.  We recommended continuation of the dara-RVd to try and get a deeper response.  He was not able to tolerate a dose of Revlimid  higher than 10 mg.  Labs on 11/01/23 revealed a 1.0 g/dL M-spike with an IgA lambda on IFE, IgA 1746 mg/dL and serum free kappa light chain level 0.70 mg/dL, lambda 1.21 mg/dL and ratio 9.91.  He started cycle 8 of dara-RVd.   A bone marrow on 11/24/23 was 60% cellular with 20% lambda restricted plasma cells.    He was seen in our clinic 01/04/24 and was doing relatively well.  He was doing his own ADLs and was able to walk about 25 yards without rest.  He uses a cane for stability around the house and a walker for longer distances.  His appetite was good and he had no nausea or vomiting, but did have intermittent loose stools.  He had bone pain and mild numbness of his fingertips.  He had no cough or shortness of breath at rest.  He is in chronic a-fib, but has no chest pain, PND, orthopnea or peripheral edema.  He was not  needing his prn furosemide . At   He presents today, 05/28/24, for myeloma follow up.  History of Present Illness Victor Phillips is a 68 year old male with  AFib, HTN, HLD, T2DM, HFrEF, and IgA multiple myeloma who presents for follow-up after treatment interruption due to heart failure.  At his last visit he had signs of progressive disease and a carfilzomib  based regimen was recommended. He started Carfilzomib  selinexor  dex 01/25/24 with a response (mspike 2.6 01/12/24 -->mspike 0.7 03/13/24) but unfortunately developed worsening Afib and heart failure with  LVEF 30-35%, and treatment was stopped 04/25/24 (C3D15). He was started on amiodorone for Afib 05/07/24. Since discontinuing treatment his m-spike and lambda light chain have begun to rise - 1.7 and 326.9, respectively. Labs 05/22/24  M-spike 1.7, biclonal IgA lambda IgG 374, IgA 2866, IgM 7 Lambda 326.9, Kappa 3.6, K/L 0.01 CBC Hgb 9.4  He has a history of IgA multiple myeloma and was initially treated with a four-drug regimen including DARA RVD. However, he did not achieve the expected response and was transitioned to a regimen of carfilzomib , selinexor , and dexamethasone  around May 2025. This treatment was put on hold in August 2025 due to the development of heart failure.  He experiences weakness and shortness of breath, which led to the discontinuation of his treatment.  He also had swelling in his feet, which has since resolved with the use of diuretics. He continues to take diuretics to manage his symptoms.  He underwent a repeat echocardiogram and was started on amiodarone  for atrial fibrillation, initially at a dose of twice daily for 14 days, then reduced to once daily (200mg ). He is also on metoprolol , which was decreased to 50 mg daily, and Eliquis  for anticoagulation.  He was previously on selinexor , which he took once a week, and noted no side effects from it. He has not received pomalidomide  or elotuzumab in the past.  He spends about  half of his day sitting or lying down due to his symptoms, but he tries to walk around as much as possible. He is currently using a wheelchair for mobility, especially for longer distances.     MEDICATIONS:  Current Outpatient Medications  Medication Sig Dispense Refill  . acetaminophen  (TYLENOL ) 325 MG tablet Take 650 mg by mouth every 6 (six) hours as needed    . acyclovir  (ZOVIRAX ) 400 MG tablet Take 400 mg by mouth 2 (two) times daily    . apixaban  (ELIQUIS ) 5 mg tablet Take 1 tablet (5 mg total) by mouth every 12 (twelve) hours    . atorvastatin  (LIPITOR) 10 MG tablet Take 1 tablet (10 mg total) by mouth once daily 90 tablet 3  . calcium  carbonate 600 mg calcium  (1,500 mg) Tab tablet Take 600 mg by mouth once daily    . cyanocobalamin , vitamin B-12, (VITAMIN B12 ORAL) Take by mouth One daily    . empagliflozin  (JARDIANCE ) 10 mg tablet Take 10 mg by mouth every morning before breakfast    . escitalopram oxalate (LEXAPRO) 5 MG tablet Take 1 tablet (5 mg total) by mouth once daily for 180 days 90 tablet 1  . FUROsemide  (LASIX ) 20 MG tablet Take 2 tablets (40 mg total) by mouth once daily Per patient 180 tablet 3  . glipiZIDE (GLUCOTROL XL) 5 MG XL tablet Take 1 tablet (5 mg total) by mouth once daily 30 tablet 11  . loperamide  (IMODIUM ) 2 mg capsule TAKE 1 CAPSULE BY MOUTH PER ADMIN INSTRUCTIONS: WITH ONSET OF DIARRHEA, TAKE 2 CAPSULES, THEN TAKE 1 CAPSULE EVERY 2 HOURS OR AFTER EVERY LOOSE BOWEL MOVEMENTS. MAX 8 CAPSULES PER DAY    . metoprolol  TARTrate (LOPRESSOR ) 50 MG tablet Take 1.5 tablets (75 mg total) by mouth 2 (two) times daily 270 tablet 3  . multivitamin with minerals tablet Take 1 tablet by mouth once daily    . ondansetron  (ZOFRAN ) 8 MG tablet Take 8 mg by mouth every 8 (eight) hours as needed    . pantoprazole  (PROTONIX ) 40 MG DR tablet Take 1 tablet (40 mg total) by mouth once daily 90 tablet 3  . polyethylene glycol (MIRALAX ) packet Take 17 g by mouth once daily    .  prochlorperazine  (COMPAZINE ) 10 MG tablet Take 10 mg by mouth every 6 (six) hours as needed    . temazepam (RESTORIL) 30 mg capsule Take 1 capsule (30 mg total) by mouth at bedtime as needed for Sleep 30 capsule 5  . XPOVIO  60 mg/week (60 mg x 1) Tab Take 60 mg by mouth    . cholecalciferol (VITAMIN D3) 1000 unit tablet Take 1,000 Units by mouth once daily    . dexAMETHasone  (DECADRON ) 4 MG tablet HAS NOT YET TAKEN     No current facility-administered medications for this visit.    ALLERGIES: No Known Allergies  PMH/PSH/SH/FH: No  change since last evaluation except as per HPI.   ROS:  Constitutional: As per HPI; no fever.   Eyes: No vision changes. Ears, nose, mouth, throat: No mouth sores or difficulty swallowing. Hematologic/lymphatic: No bleeding, bruising or adenopathy. Cardiovascular: As per HPI.  Gastrointestinal: As per HPI. Genitourinary: No dysuria, hematuria, frequency or urgency.  Integumentary: No rash.  Neurologic: As per HPI; no headaches, seizure activity, ataxia or focal weakness.  Psychiatric: No significant anxiety or depression. Endocrine: No heat or cold intolerance, goiter, thirst or polyuria. Allergic/immunologic: As above. Respiratory: As per HPI.  Musculoskeletal: As per HPI. KPS: 60%   PHYSICAL EXAM: BP 131/65 (BP Location: Left upper arm, Patient Position: Sitting, BP Cuff Size: Adult)   Pulse 72   Temp 36.6 C (97.8 F) (Oral)   Resp 18   Wt 71.2 kg (156 lb 15.5 oz)   SpO2 97%   BMI 24.58 kg/m   Body mass index is 24.58 kg/m.  ECOG PS 2/3 General: Relatively well-appearing man in no distress. In wheelchair. HEENT: PERRL, EOMI, sclera non icteric; no oral lesions. Cardio: Irregular rhythm. No m/r/g Resp: Clear. GI: Soft, non tender, bowel sounds present. Extremities: trace LE edema. Neuro: Alert and oriented; cranial nerves intact; no focal weakness.  LAB:  Ancillary Orders on 05/28/2024  Component Date Value  . Lactate Dehydrogenase  (L* 05/28/2024 149   . Phosphorus 05/28/2024 3.3   . Magnesium  05/28/2024 2.2   . Sodium 05/28/2024 137   . Potassium 05/28/2024 4.2   . Chloride 05/28/2024 105   . Carbon Dioxide (CO2) 05/28/2024 25   . Urea Nitrogen (BUN) 05/28/2024 39 (H)   . Creatinine 05/28/2024 1.2   . Glucose 05/28/2024 132   . Calcium  05/28/2024 8.4 (L)   . AST (Aspartate Aminotran* 05/28/2024 22   . ALT (Alanine Aminotransf* 05/28/2024 25   . Bilirubin, Total 05/28/2024 0.6   . Alk Phos (Alkaline Phosp* 05/28/2024 135 (H)   . Albumin 05/28/2024 3.5   . Protein, Total 05/28/2024 8.6 (H)   . Anion Gap 05/28/2024 7   . BUN/CREA Ratio 05/28/2024 33 (H)   . Glomerular Filtration Ra* 05/28/2024 67   . WBC (White Blood Cell Co* 05/28/2024 5.3   . Hemoglobin 05/28/2024 9.6 (L)   . Hematocrit 05/28/2024 30.0 (L)   . Platelets 05/28/2024 217   . MCV (Mean Corpuscular Vo* 05/28/2024 111 (H)   . MCH (Mean Corpuscular He* 05/28/2024 35.6 (H)   . MCHC (Mean Corpuscular H* 05/28/2024 32.0   . RBC (Red Blood Cell Coun* 05/28/2024 2.70 (L)   . RDW-CV (Red Cell Distrib* 05/28/2024 13.9   . NRBC (Nucleated Red Bloo* 05/28/2024 0.00   . NRBC % (Nucleated Red Bl* 05/28/2024 0.0   . MPV (Mean Platelet Volum* 05/28/2024 9.4   . Slide Review/Morphology 05/28/2024 Yes   . Immature Platelet Fracti* 05/28/2024 1.8   . Segmented Neutrophil % 05/28/2024 81 (H)   . Band % 05/28/2024 1   . Lymphocyte % 05/28/2024 9 (L)   . Monocyte % 05/28/2024 5   . Eosinophil %  05/28/2024 1   . Myelocyte % 05/28/2024 3 (H)   . Slide Review/Morphology 05/28/2024 Yes   . Neutrophil Count, Absolu* 05/28/2024 4.29   . Band Count, Absolute 05/28/2024 0.05   . Total Absolute Neutrophi* 05/28/2024 4.35   . Lymphocyte Count, Absolu* 05/28/2024 0.48 (L)   . Monocyte Count, Absolute 05/28/2024 0.27   . Eosinophil Count, Absolu* 05/28/2024 0.05   . Myelocyte Count, Absolute  05/28/2024 0.16 (H)    Echo 05/17/24 IMPRESSIONS  1. Left ventricular  ejection fraction, by estimation, is 30 to 35%. The left ventricle has moderately decreased function. The left ventricle demonstrates global hypokinesis. The left ventricular internal cavity size was moderately dilated. Left  ventricular diastolic parameters are indeterminate.   2. Right ventricular systolic function is moderately reduced. The right ventricular size is normal. There is severely elevated pulmonary artery systolic pressure. The estimated right ventricular systolic pressure is 73.1 mmHg.   3. Left atrial size was mildly dilated.   4. Right atrial size was mildly dilated.   5. The mitral valve is normal in structure. Mild mitral valve regurgitation. No evidence of mitral stenosis.   6. Tricuspid valve regurgitation is moderate.   7. The aortic valve is normal in structure. Aortic valve regurgitation is not visualized. No aortic stenosis is present.   8. The inferior vena cava is dilated in size with <50% respiratory variability, suggesting right atrial pressure of 15 mmHg.   ___________________________________  IMPRESSION:  a. 5/31-02/14/2023: Hospitalized for pneumonia. Found to be anemic to 6.8. EGD w gastritis. Colonoscopy unremarkable. Iron repleted, B12 supplementation started. b. 03/14/23: initial MM labs: M spike 4.3 (IgA lambda); kappa FLC 9.5, Lambda FLC 142.7, K/L ratio: 0.07; IgG 276, IgM 7, IgA >6400; Hg 7.4, MCV 105, Cr 1.4, Ca 9.0 c. 04/05/23:bone marrow biopsy showed hypercellular bone marrow with plasma cell neoplasm.The bone marrow is hypercellular for age with prominent increase in plasma cells representing 65% of all cells in the aspirate associated with interstitial infiltrates and numerous variably sized aggregates in the clot and biopsy sections.  The plasma cells display lambda light chain restriction consistent with plasma cell neoplasm. Normal cytogenetics.  FISH with del13q, duplication of 1q, t(4;14) d. Metastatic bone survey 04/14/23 with no lytic lesions or other  intrinsic bony abnormality. e. Whole body PET 04/25/23 with two tiny hypermetabolic lucent lesions in the left posteromedial  eighth and ninth ribs. No additional evidence of multiple myeloma.  f. 07/01/23: C4D1 DaraVRd; 1.2 and 0.1 g/dL M-spikes; biclonal IgA lambda and IgG kappa on IFE; IgA 1853 mg/dL; serum free kappa light chain level 0.95 mg/dL, lambda 1.94 mg/dL and ratio 0.12. g. 07/25/23: 1.20 and 0.06 g/dL M-spikes; biclonal IgA lambda and IgG kappa on IFE; IgA 1410 mg/dL; serum free kappa light chain level 1.04 mg/dL, lambda 0.96 mg/dL and ratio 9.87; bone marrow revealed 50-50% plasma cells wth +1q, +19p, del(13q) and del(4p) on FISH and complex cytogenetics with a net gain of 1q; CT skeletal survey showed no bone lesions. h. Cardiac MRI on 07/27/23 revealed LVEF 50% and no evidence of amyloid. i. Continued dara-RVd to try and get a deeper response; not able to tolerate a dose of Revlimid  higher than 10 mg. j. 11/01/23: 1.0 g/dL M-spike; IgA lambda on IFE; IgA 1746 mg/dL; serum free kappa light chain level 0.70 mg/dL, lambda 1.21 mg/dL and ratio 9.91; started cycle 8 of dara-RVd.    k. Bone marrow on 11/24/23 was 60% cellular with 20% lambda restricted plasma cells.  l. 01/04/24:  2.33 and 0.15 g/dL M-spikes; IgA lambda x 2 on IFE; IgA 3300 mg/dL; serum free kappa light chain level 0.74 mg/dL, lambda 86.49 mg/dL and ratio 9.94 F.4/85/74 started Carfilzomib  selinexor  dex with a response (mspike 2.6 01/12/24 -->mspike 0.7 03/13/24) N.  04/2024 developed worsening Afib and heart failure with LVEF 30-35%, and treatment was stopped 04/25/24 (C3D15). He was started on amiodorone for Afib 05/07/24   DISPOSITION:  Mr. Montagna is a 68 year old man with a history of atrial fibrillation, CKD, DM2, new HFrEF, and functional high-risk IgA lambda multiple myeloma with t(4;14) as outlined above.  He had about a 75% decrease in his M-spike and a decrease in his bone marrow plasmacytosis from 70% to 20% with eight  cycles of dara-RVd.  He was not able to tolerate a dose of Revlimid  higher than 10 mg.  He was responding to Carfilzomib /selinexor /dexamethasone  but developed heart failure with discontinuation of carfilzomib . He is now progressing off treatment. He now has evidence of progressive disease. Given his new heart failure and limited functional status he is not a candidate for high dose therapy with autologous stem cell support, and I have reservations about his ability to tolerate BCMA CarT therapy given his functional status and level of disease. I would recommend resuming selinexor  60mg  weekly in combination with pomalidomide  and dexamethasone .  As he was unable to tolerate more than 10mg  of lenalidomide , would start at 2mg  of pomalidomide  and increase dose if tolerated. If he has a response and improvement in functional status, CAR-T cell therapy could be considered. Bispecific antibody treatment is an alternative, but is approved after 4 lines of therapy.  I will have him follow up in 3 months for reassessment.  Marsa Gu, DO, MS  I spent a total of 45 minutes in both face-to-face and non-face-to-face activities, excluding procedures performed, for this visit on the date of this encounter.

## 2024-05-31 ENCOUNTER — Telehealth: Payer: Self-pay

## 2024-05-31 NOTE — Telephone Encounter (Signed)
 Contacted [facility] and spoke to with update that patient will resume Xpovio  60 mg weekly.   Pt informed that he will resume Xpovio  and Dexamethasone  and that Dr. Babara will start him on Pomalyst . Pt verbalized understanding.   Dr. Babara please advise on follow-up?

## 2024-06-01 ENCOUNTER — Telehealth: Payer: Self-pay | Admitting: Pharmacy Technician

## 2024-06-01 ENCOUNTER — Telehealth: Payer: Self-pay | Admitting: Pharmacist

## 2024-06-01 ENCOUNTER — Other Ambulatory Visit (HOSPITAL_COMMUNITY): Payer: Self-pay

## 2024-06-01 ENCOUNTER — Encounter: Payer: Self-pay | Admitting: Oncology

## 2024-06-01 DIAGNOSIS — C9000 Multiple myeloma not having achieved remission: Secondary | ICD-10-CM

## 2024-06-01 MED ORDER — POMALIDOMIDE 2 MG PO CAPS: 2.0000 mg | ORAL_CAPSULE | Freq: Every day | ORAL | 0 refills | Status: DC

## 2024-06-01 NOTE — Telephone Encounter (Signed)
 Oral Oncology Patient Advocate Encounter   New authorization   Received notification that prior authorization for Pomalyst  is required.   PA submitted on CMM via Latent Key [id number] Status is pending     Javonda Suh ([name]) [name], CPhT  [facility], [name], [facility] - Oral Chemotherapy Patient Advocate Specialist III Phone: [phone]  Fax: [phone]

## 2024-06-01 NOTE — Telephone Encounter (Addendum)
 Pharmacy Student Encounter   Received new prescription for Pomalyst  (pomalidomide ) for the treatment of high-risk IgA lambda multiple myeloma with t(4;14) in conjunction with silenexor and dexamethasone , planned duration of until disease progression or unacceptable drug toxicity.  CMP and CBC from 05/28/2024 assessed, no relevant abnormalities identified. Prescription dose and frequency assessed.  MD plans to start pomalidomide  dose at 2 mg and increase to target 4 mg as tolerated. Typical dosing schedule is 21 days on, then 7 days off on a 28 day cycle.   Current medication list in Epic reviewed, no DDIs with pomalidomide  identified.  Evaluated chart and no patient barriers to medication adherence identified.   Oral Oncology Clinic will continue to follow for insurance authorization, copayment issues, initial counseling and start date.   Signe JINNY Platt, PharmD Candidate 2026  [facility] Oral Chemotherapy Navigation [facility] [phone]  06/01/2024 2:18 PM

## 2024-06-01 NOTE — Telephone Encounter (Signed)
 Oral Oncology Patient Advocate Encounter  Prior Authorization for Pomalyst  has been approved.    PA# [id number] Effective dates: 06/01/2024 through 09/12/2024  Patients co-pay is $0.    Jodey Burbano ([name]) [name], CPhT  [facility], [name], [facility] - Oral Chemotherapy Patient Advocate Specialist III Phone: [phone]  Fax: [phone]

## 2024-06-04 NOTE — Telephone Encounter (Signed)
 Patient is aware his prescription for Pomalyst  was sent to [facility]. I asked him to call the pharmacy to set-up delivery if he has not heard from them by 06/06/24.   Per patient he usually takes his Xpovio  on Fridays and the goal would be to get his Pomalyst  in hand by Friday so he can coordinate his medication cycles.

## 2024-06-05 ENCOUNTER — Other Ambulatory Visit: Payer: Self-pay | Admitting: Internal Medicine

## 2024-06-05 DIAGNOSIS — I6522 Occlusion and stenosis of left carotid artery: Secondary | ICD-10-CM

## 2024-06-05 DIAGNOSIS — C9000 Multiple myeloma not having achieved remission: Secondary | ICD-10-CM

## 2024-06-05 DIAGNOSIS — R55 Syncope and collapse: Secondary | ICD-10-CM

## 2024-06-06 ENCOUNTER — Telehealth: Payer: Self-pay

## 2024-06-06 ENCOUNTER — Other Ambulatory Visit: Payer: Self-pay

## 2024-06-06 ENCOUNTER — Telehealth: Payer: Self-pay | Admitting: *Deleted

## 2024-06-06 DIAGNOSIS — C9000 Multiple myeloma not having achieved remission: Secondary | ICD-10-CM

## 2024-06-06 NOTE — Telephone Encounter (Signed)
 Per Alyson's note patient's plan is to start Pomalyst  by Friday so he can take same day as Selinexor .   Please schedule lab/MD (cbc,cmp, MM, light chain) on 9/26. I will notify pt of appt details.

## 2024-06-06 NOTE — Telephone Encounter (Signed)
 Patient says no one is called him at all right now and he has no medicine.  I asked him if he called the  specialty pharmacy.  He did and he said that that they are going to be sending it out next Wednesday

## 2024-06-06 NOTE — Telephone Encounter (Signed)
-----   Message from Zelphia Cap sent at 05/31/2024 10:06 PM EDT ----- Hi Team,  I plan to start him on Xpd protocol Selinexor  60mg  weekly, Pomalyst  2mg  Days 1-21, and dexamethasone  20mg  weekly.   He has Selinexor  60mg  weekly and Dex supply and I have asked him to start while waiting for Pomalyst . Alyson and [place], please check his coverage.   Please arrange him to see lab Md when he is ready to start Pomalyst . Prefer to start on the day he takes Selinexor .    Thank you

## 2024-06-06 NOTE — Telephone Encounter (Signed)
 Per Dr. Babara, pt can take Selinexor  this week and continue Dex. Pt states he will receive Pomalyst  next Wednesday. Pt has been scheduled for follow up on 10/3 and he is aware of appts. He was advised not to start Pomalyst  until he see's Dr. Babara.

## 2024-06-07 NOTE — Telephone Encounter (Signed)
 Contacted [facility] via phone at 10am on 06/07/24. Was able to get Victor Phillips Pomalyst  delivery rescheduled for Friday 06/08/24.   Spoke to patient via phone and informed them of new delivery date.

## 2024-06-08 ENCOUNTER — Encounter: Payer: Self-pay | Admitting: Oncology

## 2024-06-08 ENCOUNTER — Other Ambulatory Visit

## 2024-06-08 ENCOUNTER — Other Ambulatory Visit: Payer: Self-pay

## 2024-06-08 ENCOUNTER — Inpatient Hospital Stay (HOSPITAL_BASED_OUTPATIENT_CLINIC_OR_DEPARTMENT_OTHER): Admitting: Oncology

## 2024-06-08 ENCOUNTER — Inpatient Hospital Stay: Admitting: Pharmacist

## 2024-06-08 ENCOUNTER — Inpatient Hospital Stay

## 2024-06-08 ENCOUNTER — Telehealth: Payer: Self-pay

## 2024-06-08 ENCOUNTER — Ambulatory Visit: Admitting: Oncology

## 2024-06-08 VITALS — BP 112/70 | HR 80 | Temp 96.0°F | Resp 18 | Wt 156.3 lb

## 2024-06-08 DIAGNOSIS — I5031 Acute diastolic (congestive) heart failure: Secondary | ICD-10-CM | POA: Diagnosis not present

## 2024-06-08 DIAGNOSIS — C9002 Multiple myeloma in relapse: Secondary | ICD-10-CM | POA: Diagnosis not present

## 2024-06-08 DIAGNOSIS — C9000 Multiple myeloma not having achieved remission: Secondary | ICD-10-CM | POA: Diagnosis not present

## 2024-06-08 DIAGNOSIS — D6481 Anemia due to antineoplastic chemotherapy: Secondary | ICD-10-CM

## 2024-06-08 DIAGNOSIS — T451X5A Adverse effect of antineoplastic and immunosuppressive drugs, initial encounter: Secondary | ICD-10-CM

## 2024-06-08 DIAGNOSIS — I10 Essential (primary) hypertension: Secondary | ICD-10-CM

## 2024-06-08 DIAGNOSIS — Z5111 Encounter for antineoplastic chemotherapy: Secondary | ICD-10-CM

## 2024-06-08 DIAGNOSIS — N1832 Chronic kidney disease, stage 3b: Secondary | ICD-10-CM | POA: Diagnosis not present

## 2024-06-08 LAB — CBC WITH DIFFERENTIAL (CANCER CENTER ONLY)
Abs Immature Granulocytes: 0.07 K/uL (ref 0.00–0.07)
Basophils Absolute: 0 K/uL (ref 0.0–0.1)
Basophils Relative: 0 %
Eosinophils Absolute: 0.1 K/uL (ref 0.0–0.5)
Eosinophils Relative: 2 %
HCT: 28.6 % — ABNORMAL LOW (ref 39.0–52.0)
Hemoglobin: 9.5 g/dL — ABNORMAL LOW (ref 13.0–17.0)
Immature Granulocytes: 2 %
Lymphocytes Relative: 18 %
Lymphs Abs: 0.7 K/uL (ref 0.7–4.0)
MCH: 35.6 pg — ABNORMAL HIGH (ref 26.0–34.0)
MCHC: 33.2 g/dL (ref 30.0–36.0)
MCV: 107.1 fL — ABNORMAL HIGH (ref 80.0–100.0)
Monocytes Absolute: 0.4 K/uL (ref 0.1–1.0)
Monocytes Relative: 10 %
Neutro Abs: 2.6 K/uL (ref 1.7–7.7)
Neutrophils Relative %: 68 %
Platelet Count: 141 K/uL — ABNORMAL LOW (ref 150–400)
RBC: 2.67 MIL/uL — ABNORMAL LOW (ref 4.22–5.81)
RDW: 14.3 % (ref 11.5–15.5)
WBC Count: 3.8 K/uL — ABNORMAL LOW (ref 4.0–10.5)
nRBC: 0 % (ref 0.0–0.2)

## 2024-06-08 LAB — CMP (CANCER CENTER ONLY)
ALT: 36 U/L (ref 0–44)
AST: 29 U/L (ref 15–41)
Albumin: 3.4 g/dL — ABNORMAL LOW (ref 3.5–5.0)
Alkaline Phosphatase: 91 U/L (ref 38–126)
Anion gap: 9 (ref 5–15)
BUN: 32 mg/dL — ABNORMAL HIGH (ref 8–23)
CO2: 21 mmol/L — ABNORMAL LOW (ref 22–32)
Calcium: 8.4 mg/dL — ABNORMAL LOW (ref 8.9–10.3)
Chloride: 103 mmol/L (ref 98–111)
Creatinine: 1.51 mg/dL — ABNORMAL HIGH (ref 0.61–1.24)
GFR, Estimated: 51 mL/min — ABNORMAL LOW (ref >60)
Glucose, Bld: 131 mg/dL — ABNORMAL HIGH (ref 70–99)
Potassium: 4.8 mmol/L (ref 3.5–5.1)
Sodium: 133 mmol/L — ABNORMAL LOW (ref 135–145)
Total Bilirubin: 0.8 mg/dL (ref 0.0–1.2)
Total Protein: 8.8 g/dL — ABNORMAL HIGH (ref 6.5–8.1)

## 2024-06-08 MED ORDER — FUROSEMIDE 20 MG PO TABS: 20.0000 mg | ORAL_TABLET | Freq: Every day | ORAL | Status: AC

## 2024-06-08 NOTE — Assessment & Plan Note (Signed)
 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  Lab Results  Component Value Date   HGB 9.5 (L) 06/08/2024   TIBC 304 03/28/2024   IRONPCTSAT 23 03/28/2024   FERRITIN 535 (H) 03/28/2024    Stable hemoglobin

## 2024-06-08 NOTE — Telephone Encounter (Signed)
-----   Message from Cadence VEAR Fishman sent at 06/08/2024  9:35 AM EDT ----- Regarding: SCr Recent labs showed increasing Scr/BUN. We can go down to lasix  20mg  daily. BMET in 2 weeks.

## 2024-06-08 NOTE — Assessment & Plan Note (Signed)
 Chemotherapy plan as listed above

## 2024-06-08 NOTE — Telephone Encounter (Signed)
 Called patient to advise of Victor Phillips, [name], result note and that she requested labs in 2 weeks - patient verbalized understanding  Lab order entered for patient

## 2024-06-08 NOTE — Assessment & Plan Note (Signed)
 Follow up with cardiology Patient is on Lasix  40 mg daily.  Creatinine increased to 1.5. Discussed with cardiology, recommend patient to decrease Lasix  to 20 mg daily.

## 2024-06-08 NOTE — Assessment & Plan Note (Signed)
 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 Partial response after 3 cycles. cycle 3 D15 cancelled due to acute CHF  He is doing better. Discussed with cardiology.  [facility]ology recommendation was reviewed.  Labs are reviewed and discussed with patient. Continue Selinexor  60mg  and dexamethasone  20 mg weekly, start Pomalyst  2 mg day 1-21.  Prophylaxis  Continue Acyclovir , At risk of thrombosis, on Eliquis  5mg  BID for A fib Xegeva monthly if calcium  meets criteria - held since CHF, plan to resume.  Recommend calcium  and vitamin D  supplementation.

## 2024-06-08 NOTE — Progress Notes (Signed)
 Hematology/Oncology Progress note [phone] [phone]         Patient Care Team: Cleotilde Oneil FALCON, MD as PCP - General (Internal Medicine) Darliss Rogue, MD as PCP - Cardiology (Cardiology) Kennyth Chew, MD as PCP - Electrophysiology (Clinical Cardiac Electrophysiology) Babara Call, MD as Consulting Physician (Oncology) Riddle, Suzann, NP as Nurse Practitioner (Clinical Cardiac Electrophysiology) Franchester Mikey DEL, PA-C as Physician Assistant (Cardiology)  CHIEF COMPLAINTS/REASON FOR VISIT:  Multiple myeloma   ASSESSMENT & PLAN:   Cancer Staging  Multiple myeloma [facility]) Staging form: Plasma Cell Myeloma and Plasma Cell Disorders, AJCC 8th Edition - Clinical stage from 04/14/2023: RISS Stage II (Beta-2 -microglobulin (mg/L): 8.4, Albumin (g/dL): 2.6, ISS: Stage III, High-risk cytogenetics: Absent, LDH: Normal) - Signed by Babara Call, MD on 04/22/2023   Multiple myeloma (HCC) IgA lamda multiple myeloma, 67% plasma cell involvement, IgA lamda, M protein 4.3,  S/p 8 cycles  Daratumumab  Rvd- refractory disease-  2nd line Carfilzomib  D1, 8 15 Q28 days and Selinexor  60mg  Dexamethasone  weekly. Partial response after 3 cycles. cycle 3 D15 cancelled due to acute CHF  He is doing better. Discussed with cardiology.  [facility] oncology recommendation was reviewed.  Labs are reviewed and discussed with patient. Continue Selinexor  60mg  and dexamethasone  20 mg weekly, start Pomalyst  2 mg day 1-21.  Prophylaxis  Continue Acyclovir , At risk of thrombosis, on Eliquis  5mg  BID for A fib Xegeva monthly if calcium  meets criteria - held since CHF, plan to resume.  Recommend calcium  and vitamin D  supplementation.    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  Lab Results  Component Value Date   HGB 9.5 (L) 06/08/2024   TIBC 304 03/28/2024   IRONPCTSAT 23 03/28/2024   FERRITIN 535 (H) 03/28/2024     Stable hemoglobin  CKD (chronic kidney disease) stage 3, GFR 30-59 ml/min (HCC) Encourage oral hydration and avoid nephrotoxins.    Diastolic CHF, acute (HCC) Follow up with cardiology Patient is on Lasix  40 mg daily.  Creatinine increased to 1.5. Discussed with cardiology, recommend patient to decrease Lasix  to 20 mg daily.  Encounter for antineoplastic chemotherapy Chemotherapy plan as listed above.   Orders Placed This Encounter  Procedures   CMP (Cancer Center only)    Standing Status:   Future    Expected Date:   06/15/2024    Expiration Date:   09/13/2024   CBC with Differential (Cancer Center Only)    Standing Status:   Future    Expected Date:   06/15/2024    Expiration Date:   09/13/2024   Follow-up 1 week  We spent sufficient time to discuss many aspect of care, questions were answered to patient's satisfaction.    Call Babara, MD, PhD [facility] Hematology Oncology 06/08/2024     HISTORY OF PRESENTING ILLNESS:  Victor Phillips is a  68 y.o.  male with PMH listed below who was referred to me for anemia  02/11/2023 - 02/14/2023 recent hospitalization due to pneumonia, respiratory failure.  He was found to have a hemoglobin decreased at 6.8, status post PRBC transfusion during admission.  EGD showed gastritis.  Colonoscopy was not remarkable. 02/11/2023 TIBC 221 ferritin 107, iron saturation 16. Patient is currently taking fusion plus Vitamin B12 level in the 300s. His echo showed grade 2 diastolic CHF.  He denies recent chest pain on exertion, shortness of breath on minimal exertion, pre-syncopal episodes, or palpitations He had not noticed any recent bleeding  such as epistaxis, hematuria or hematochezia.  He denies over the counter NSAID ingestion.  Oncology History  Multiple myeloma (HCC)  04/14/2023 Initial Diagnosis   Multiple myeloma   03/13/2020 multiple myeloma panel showed M protein 4.3, IgA lambda Free lamda Level 142,-light chain ratio 0.07 04/05/2023 bone  marrow biopsy showed hypercellular bone marrow with plasma cell neoplasm.The bone marrow is hypercellular for age with prominent increase in plasma cells representing 65% of all cells in the aspirate associated with interstitial infiltrates and numerous variably sized aggregates in  the clot and biopsy sections.  The plasma cells display lambda light chain restriction consistent with plasma cell neoplasm.  Normal cytogenetics.  FISH studies pending.    04/14/2023 Cancer Staging   Staging form: Plasma Cell Myeloma and Plasma Cell Disorders, AJCC 8th Edition - Clinical stage from 04/14/2023: RISS Stage II (Beta-2 -microglobulin (mg/L): 8.4, Albumin (g/dL): 2.6, ISS: Stage III, High-risk cytogenetics: Absent, LDH: Normal) - Signed by Babara Call, MD on 04/22/2023 Stage prefix: Initial diagnosis Beta 2 microglobulin range (mg/L): Greater than or equal to 5.5 Albumin range (g/dL): Less than 3.5 Cytogenetics: 1q addition, Other mutation   04/14/2023 Imaging   Skeletal survey 1. No lytic lesions or other intrinsic bony abnormality. 2. Borderline cardiomegaly with an interval decrease in size. 3. Diffuse atheromatous arterial calcifications including bilateral carotid artery calcifications, right greater than left   04/22/2023 - 11/11/2023 Chemotherapy   Patient is on Treatment Plan : MYELOMA NEWLY DIAGNOSED TRANSPLANT CANDIDATE DaraVRd (Daratumumab  SQ) q21d     04/25/2023 Imaging   PET scan showed  1. Two tiny hypermetabolic lucent lesions in the left posteromedial eighth and ninth ribs. No additional evidence of multiple myeloma. 2. Aortic atherosclerosis (ICD10-I70.0). Coronary artery calcification.   07/14/2023 - 07/19/2023 Hospital Admission   Hospitalized due to ileus.    07/25/2023 Bone Marrow Biopsy   A-C. Peripheral blood and bone marrow, (peripheral smear, aspirate smear, touch preparation, clot section, core biopsy):   Persistent plasma cell myeloma, 50-60% lambda restricted plasma cells on core  biopsy. Hypercellular bone marrow (70%) with trilineage hematopoiesis. Negative for increased blasts. Negative for significant dysplasia. Negative for amyloid deposition.   01/25/2024 -  Chemotherapy   Patient is on Treatment Plan : MYELOMA RELAPSED/ REFRACTORY Carfilzomib  D1,8,15 (20/27) + selinexor   + Dexamethasone  (KPd) q28d       A Fib. on Eliquis  5mg  BID, follows up with cardiology.  Patient reports being compliant on Eliquis .  He was seen by cardiology and started on diuretics.  He reports feeling better now.  Patient takes Lasix  40 mg denies any chest pain, nausea vomiting.   MEDICAL HISTORY:  Past Medical History:  Diagnosis Date   Atrial fibrillation (HCC)    Diabetes mellitus without complication (HCC)    Hypercholesteremia    Hypertension    Multiple myeloma not having achieved remission (HCC)     SURGICAL HISTORY: Past Surgical History:  Procedure Laterality Date   CARDIOVERSION N/A 10/17/2023   Procedure: CARDIOVERSION;  Surgeon: Darliss Rogue, MD;  Location: [facility] ORS;  Service: Cardiovascular;  Laterality: N/A;   COLONOSCOPY N/A 02/13/2023   Procedure: COLONOSCOPY;  Surgeon: Toledo, Ladell POUR, MD;  Location: [facility] ENDOSCOPY;  Service: Gastroenterology;  Laterality: N/A;   COLONOSCOPY N/A 02/14/2023   Procedure: COLONOSCOPY;  Surgeon: Toledo, Ladell POUR, MD;  Location: [facility] ENDOSCOPY;  Service: Gastroenterology;  Laterality: N/A;   ESOPHAGOGASTRODUODENOSCOPY N/A 02/13/2023   Procedure: ESOPHAGOGASTRODUODENOSCOPY (EGD);  Surgeon: Toledo, Ladell POUR, MD;  Location: [facility] ENDOSCOPY;  Service: Gastroenterology;  Laterality:  N/A;   IR BONE MARROW BIOPSY & ASPIRATION  11/24/2023    SOCIAL HISTORY: Social History   Socioeconomic History   Marital status: Married    Spouse name: Not on file   Number of children: Not on file   Years of education: Not on file   Highest education level: Not on file  Occupational History   Not on file  Tobacco Use   Smoking status: Never    Smokeless tobacco: Never  Vaping Use   Vaping status: Never Used  Substance and Sexual Activity   Alcohol use: Not Currently    Comment: quit approx 2001   Drug use: No   Sexual activity: Not on file  Other Topics Concern   Not on file  Social History Narrative   Not on file   Social Drivers of Health   Financial Resource Strain: Medium Risk (01/13/2024)   Overall Financial Resource Strain (CARDIA)    Difficulty of Paying Living Expenses: Somewhat hard  Food Insecurity: Food Insecurity Present (01/13/2024)   Hunger Vital Sign    Worried About Running Out of Food in the Last Year: Sometimes true    Ran Out of Food in the Last Year: Sometimes true  Transportation Needs: No Transportation Needs (01/13/2024)   PRAPARE - Administrator, Civil Service (Medical): No    Lack of Transportation (Non-Medical): No  Physical Activity: Insufficiently Active (10/12/2023)   Received from [facility]   Exercise Vital Sign    On average, how many days per week do you engage in moderate to strenuous exercise (like a brisk walk)?: 7 days    On average, how many minutes do you engage in exercise at this level?: 10 min  Stress: No Stress Concern Present (10/12/2023)   Received from [facility] - Occupational Stress Questionnaire    Feeling of Stress : Not at all  Recent Concern: Stress - Stress Concern Present (10/04/2023)   Received from [facility] - Occupational Stress Questionnaire    Feeling of Stress : Rather much  Social Connections: Moderately Integrated (10/12/2023)   Received from [facility]   Social Connection and Isolation Panel    In a typical week, how many times do you talk on the phone with family, friends, or neighbors?: More than three times a week    How often do you get together with friends or relatives?: More than  three times a week    How often do you attend church or religious services?: 1 to 4 times per year    Do you belong to any clubs or organizations such as church groups, unions, fraternal or athletic groups, or school groups?: No    How often do you attend meetings of the clubs or organizations you belong to?: Never    Are you married, widowed, divorced, separated, never married, or living with a partner?: Married  Intimate Partner Violence: Not At Risk (07/14/2023)   Humiliation, Afraid, Rape, and Kick questionnaire    Fear of Current or Ex-Partner: No    Emotionally Abused: No    Physically Abused: No    Sexually Abused: No    FAMILY HISTORY: Family History  Problem Relation Age of Onset   Diabetes Mother    Heart attack Father     ALLERGIES:  has no known allergies.  MEDICATIONS:  Current Outpatient Medications  Medication  Sig Dispense Refill   acetaminophen  (TYLENOL ) 325 MG tablet Take 2 tablets (650 mg total) by mouth every 6 (six) hours as needed for mild pain (or Fever >/= 101). 90 tablet 1   acyclovir  (ZOVIRAX ) 400 MG tablet Take 1 tablet (400 mg total) by mouth 2 (two) times daily. 180 tablet 0   amiodarone  (PACERONE ) 200 MG tablet Take 1 tablet by mouth two times daily for 14 days, then decrease to 1 tablet daily thereafter 60 tablet 1   apixaban  (ELIQUIS ) 5 MG TABS tablet Take 1 tablet (5 mg total) by mouth 2 (two) times daily. 60 tablet 5   atorvastatin  (LIPITOR) 10 MG tablet Take 10 mg by mouth daily.     calcium  carbonate (OS-CAL) 600 MG TABS tablet Take 2 tablets (1,200 mg total) by mouth daily.     cholecalciferol (VITAMIN D3) 25 MCG (1000 UNIT) tablet Take 1 tablet (1,000 Units total) by mouth daily.     cyanocobalamin  (VITAMIN B12) 1000 MCG tablet Take 1 tablet (1,000 mcg total) by mouth daily.     dexamethasone  (DECADRON ) 4 MG tablet Take 5 tablets (20 mg) for 1 day on the week after chemotherapy (day 22). 5 tablet 11   empagliflozin  (JARDIANCE ) 10 MG TABS tablet  Take 1 tablet (10 mg total) by mouth daily before breakfast. 30 tablet 2   loperamide  (IMODIUM ) 2 MG capsule Take 1 capsule (2 mg total) by mouth See admin instructions. With onset of diarrhea, take 4 mg,then 2 mg every 2 hours or after every loose bowel movements  maximum: 16 mg/day 60 capsule 2   metoprolol  succinate (TOPROL -XL) 25 MG 24 hr tablet Take 0.5 tablets (12.5 mg total) by mouth daily. 45 tablet 1   Multiple Vitamin (MULTIVITAMIN WITH MINERALS) TABS tablet Take 1 tablet by mouth daily. 90 tablet 1   ondansetron  (ZOFRAN ) 8 MG tablet Take 1 tablet (8 mg total) by mouth every 8 (eight) hours as needed for nausea or vomiting. 90 tablet 1   pantoprazole  (PROTONIX ) 40 MG tablet Take 1 tablet (40 mg total) by mouth daily. 30 tablet 1   pomalidomide  (POMALYST ) 2 MG capsule Take 1 capsule (2 mg total) by mouth daily. Take for 21 days, then hold for 7 days. Repeat every 28 days. 21 capsule 0   prochlorperazine  (COMPAZINE ) 10 MG tablet Take 1 tablet (10 mg total) by mouth every 6 (six) hours as needed for nausea or vomiting. 90 tablet 1   selinexor  (XPOVIO , 60 MG ONCE WEEKLY,) Therapy Pack (60 mg once weekly) Take 1 tablet (60 mg total) by mouth once a week. 4 tablet 4   spironolactone  (ALDACTONE ) 25 MG tablet Take 0.5 tablets (12.5 mg total) by mouth daily. 45 tablet 1   temazepam (RESTORIL) 15 MG capsule Take 15 mg by mouth at bedtime.     furosemide  (LASIX ) 20 MG tablet Take 1 tablet (20 mg total) by mouth daily.     glipiZIDE (GLUCOTROL XL) 2.5 MG 24 hr tablet Take 5 mg by mouth daily with breakfast. (Patient not taking: Reported on 06/08/2024)     No current facility-administered medications for this visit.    Review of Systems  Constitutional:  Positive for fatigue. Negative for appetite change, chills, fever and unexpected weight change.  HENT:   Negative for hearing loss and voice change.   Eyes:  Negative for eye problems and icterus.  Respiratory:  Negative for chest tightness, cough  and shortness of breath.   Cardiovascular:  Negative for chest pain  and leg swelling.  Gastrointestinal:  Negative for abdominal distention, abdominal pain and diarrhea.  Endocrine: Negative for hot flashes.  Genitourinary:  Negative for difficulty urinating, dysuria and frequency.   Musculoskeletal:  Negative for arthralgias.  Skin:  Negative for itching and rash.  Neurological:  Negative for light-headedness and numbness.  Hematological:  Negative for adenopathy. Does not bruise/bleed easily.  Psychiatric/Behavioral:  Positive for sleep disturbance. Negative for confusion.     PHYSICAL EXAMINATION: Vitals:   06/08/24 0849  BP: 112/70  Pulse: 80  Resp: 18  Temp: (!) 96 F (35.6 C)  SpO2: 100%   Filed Weights   06/08/24 0849  Weight: 156 lb 4.8 oz (70.9 kg)    Physical Exam Constitutional:      General: He is not in acute distress.    Appearance: He is obese.  HENT:     Head: Normocephalic and atraumatic.  Eyes:     General: No scleral icterus. Cardiovascular:     Rate and Rhythm: Normal rate.  Pulmonary:     Effort: Pulmonary effort is normal. No respiratory distress.     Breath sounds: No wheezing.  Abdominal:     General: Bowel sounds are normal. There is no distension.     Palpations: Abdomen is soft.  Musculoskeletal:        General: No deformity. Normal range of motion.     Cervical back: Normal range of motion and neck supple.  Skin:    General: Skin is warm and dry.     Findings: No erythema or rash.  Neurological:     Mental Status: He is alert and oriented to person, place, and time. Mental status is at baseline.     Cranial Nerves: No cranial nerve deficit.  Psychiatric:        Mood and Affect: Mood normal.      LABORATORY DATA:  I have reviewed the data as listed    Latest Ref Rng & Units 06/08/2024    8:30 AM 05/22/2024    8:26 AM 04/25/2024    8:30 AM  CBC  WBC 4.0 - 10.5 K/uL 3.8  5.7  5.2   Hemoglobin 13.0 - 17.0 g/dL 9.5  9.4  9.9    Hematocrit 39.0 - 52.0 % 28.6  28.8  31.2   Platelets 150 - 400 K/uL 141  172  160       Latest Ref Rng & Units 06/08/2024    8:30 AM 05/22/2024    8:26 AM 04/25/2024    8:30 AM  CMP  Glucose 70 - 99 mg/dL 868  837  859   BUN 8 - 23 mg/dL 32  33  21   Creatinine 0.61 - 1.24 mg/dL 8.48  8.80  9.01   Sodium 135 - 145 mmol/L 133  136  139   Potassium 3.5 - 5.1 mmol/L 4.8  4.3  4.1   Chloride 98 - 111 mmol/L 103  104  102   CO2 22 - 32 mmol/L 21  23  28    Calcium  8.9 - 10.3 mg/dL 8.4  9.1  9.4   Total Protein 6.5 - 8.1 g/dL 8.8  8.3  7.7   Total Bilirubin 0.0 - 1.2 mg/dL 0.8  0.9  0.9   Alkaline Phos 38 - 126 U/L 91  160  101   AST 15 - 41 U/L 29  18  15    ALT 0 - 44 U/L 36  23  14    Lab Results  Component Value Date   IRON 70 03/28/2024   TIBC 304 03/28/2024   IRONPCTSAT 23 03/28/2024   FERRITIN 535 (H) 03/28/2024     RADIOGRAPHIC STUDIES: I have personally reviewed the radiological images as listed and agreed with the findings in the report. ECHOCARDIOGRAM COMPLETE Result Date: 05/17/2024    ECHOCARDIOGRAM REPORT   Patient Name:   Victor Phillips Mayden Date of Exam: 05/17/2024 Medical Rec #:  [id number]        Height:       71.0 in Accession #:    [id number]       Weight:       155.2 lb Date of Birth:  06-Nov-1955       BSA:          1.893 m Patient Age:    67 years         BP:           122/72 mmHg Patient Gender: M                HR:           94 bpm. Exam Location:  [place] Procedure: 2D Echo, Cardiac Doppler and Color Doppler (Both Spectral and Color            Flow Doppler were utilized during procedure). Indications:    I48.91* Unspeicified atrial fibrillation  History:        Patient has prior history of Echocardiogram examinations, most                 recent 06/14/2023. CHF, Arrythmias:Atrial Fibrillation,                 Signs/Symptoms:Edema; Risk Factors:Hypertension, Diabetes,                 Dyslipidemia and Non-Smoker.  Sonographer:    Damien Drones Referring Phys: [id number]  CADENCE H FURTH IMPRESSIONS  1. Left ventricular ejection fraction, by estimation, is 30 to 35%. The left ventricle has moderately decreased function. The left ventricle demonstrates global hypokinesis. The left ventricular internal cavity size was moderately dilated. Left ventricular diastolic parameters are indeterminate.  2. Right ventricular systolic function is moderately reduced. The right ventricular size is normal. There is severely elevated pulmonary artery systolic pressure. The estimated right ventricular systolic pressure is 73.1 mmHg.  3. Left atrial size was mildly dilated.  4. Right atrial size was mildly dilated.  5. The mitral valve is normal in structure. Mild mitral valve regurgitation. No evidence of mitral stenosis.  6. Tricuspid valve regurgitation is moderate.  7. The aortic valve is normal in structure. Aortic valve regurgitation is not visualized. No aortic stenosis is present.  8. The inferior vena cava is dilated in size with <50% respiratory variability, suggesting right atrial pressure of 15 mmHg. FINDINGS  Left Ventricle: Left ventricular ejection fraction, by estimation, is 30 to 35%. The left ventricle has moderately decreased function. The left ventricle demonstrates global hypokinesis. Strain was performed and the global longitudinal strain is indeterminate. The left ventricular internal cavity size was moderately dilated. There is no left ventricular hypertrophy. Left ventricular diastolic parameters are indeterminate. Right Ventricle: The right ventricular size is normal. No increase in right ventricular wall thickness. Right ventricular systolic function is moderately reduced. There is severely elevated pulmonary artery systolic pressure. The tricuspid regurgitant velocity is 3.81 m/s, and with an assumed right atrial pressure of 15 mmHg, the estimated right ventricular systolic pressure is 73.1 mmHg. Left Atrium: Left atrial size  was mildly dilated. Right Atrium: Right atrial  size was mildly dilated. Pericardium: There is no evidence of pericardial effusion. Mitral Valve: The mitral valve is normal in structure. Mild mitral valve regurgitation. No evidence of mitral valve stenosis. MV peak gradient, 7.5 mmHg. The mean mitral valve gradient is 3.0 mmHg. Tricuspid Valve: The tricuspid valve is normal in structure. Tricuspid valve regurgitation is moderate . No evidence of tricuspid stenosis. Aortic Valve: The aortic valve is normal in structure. Aortic valve regurgitation is not visualized. No aortic stenosis is present. Aortic valve mean gradient measures 4.0 mmHg. Aortic valve peak gradient measures 7.6 mmHg. Aortic valve area, by VTI measures 1.94 cm. Pulmonic Valve: The pulmonic valve was normal in structure. Pulmonic valve regurgitation is not visualized. No evidence of pulmonic stenosis. Aorta: The aortic root is normal in size and structure. Venous: The inferior vena cava is dilated in size with less than 50% respiratory variability, suggesting right atrial pressure of 15 mmHg. IAS/Shunts: No atrial level shunt detected by color flow Doppler. Additional Comments: 3D was performed not requiring image post processing on an independent workstation and was indeterminate.  LEFT VENTRICLE PLAX 2D LVIDd:         5.20 cm LVIDs:         4.20 cm LV PW:         1.00 cm LV IVS:        1.00 cm LVOT diam:     2.20 cm LV SV:         55 LV SV Index:   29 LVOT Area:     3.80 cm  LV Volumes (MOD) LV vol d, MOD A2C: 117.0 ml LV vol d, MOD A4C: 113.0 ml LV vol s, MOD A2C: 69.6 ml LV vol s, MOD A4C: 68.6 ml LV SV MOD A2C:     47.4 ml LV SV MOD A4C:     113.0 ml LV SV MOD BP:      47.1 ml RIGHT VENTRICLE            IVC RV Basal diam:  3.80 cm    IVC diam: 2.30 cm RV Mid diam:    2.70 cm RV S prime:     6.23 cm/s TAPSE (M-mode): 1.0 cm LEFT ATRIUM             Index        RIGHT ATRIUM           Index LA diam:        4.70 cm 2.48 cm/m   RA Area:     22.30 cm LA Vol (A2C):   83.2 ml 43.95 ml/m  RA  Volume:   70.20 ml  37.08 ml/m LA Vol (A4C):   77.2 ml 40.78 ml/m LA Biplane Vol: 83.6 ml 44.16 ml/m  AORTIC VALVE                    PULMONIC VALVE AV Area (Vmax):    2.03 cm     PV Vmax:       0.73 m/s AV Area (Vmean):   1.98 cm     PV Peak grad:  2.1 mmHg AV Area (VTI):     1.94 cm AV Vmax:           138.00 cm/s AV Vmean:          97.000 cm/s AV VTI:            0.286 m AV Peak Grad:  7.6 mmHg AV Mean Grad:      4.0 mmHg LVOT Vmax:         73.70 cm/s LVOT Vmean:        50.400 cm/s LVOT VTI:          0.146 m LVOT/AV VTI ratio: 0.51  AORTA Ao Root diam: 3.50 cm Ao Asc diam:  3.70 cm MITRAL VALVE                  TRICUSPID VALVE MV Area VTI:  1.83 cm        TR Peak grad:   58.1 mmHg MV Peak grad: 7.5 mmHg        TR Vmax:        381.00 cm/s MV Mean grad: 3.0 mmHg MV Vmax:      1.37 m/s        SHUNTS MV Vmean:     82.5 cm/s       Systemic VTI:  0.15 m MR Peak grad:    103.4 mmHg   Systemic Diam: 2.20 cm MR Vmax:         508.50 cm/s MR PISA:         5.09 cm MR PISA Eff ROA: 39 mm MR PISA Radius:  0.90 cm Evalene Lunger MD Electronically signed by Evalene Lunger MD Signature Date/Time: 05/17/2024/6:34:21 PM    Final

## 2024-06-08 NOTE — Progress Notes (Signed)
 Clinical Pharmacist Practitioner Clinic [facility]  [phone] [phone]  Patient Care Team: Cleotilde Oneil FALCON, MD as PCP - General (Internal Medicine) Darliss Rogue, MD as PCP - Cardiology (Cardiology) Kennyth Chew, MD as PCP - Electrophysiology (Clinical Cardiac Electrophysiology) Babara Call, MD as Consulting Physician (Oncology) Riddle, Suzann, NP as Nurse Practitioner (Clinical Cardiac Electrophysiology) Franchester Mikey VEAR DEVONNA as Physician Assistant (Cardiology)   Name of the patient: Victor Phillips  [id number]  11-03-1955   Date of visit: 06/08/24   HPI: Patient is a 68 y.o. male with relapsed multiple myeloma.  Reason for Consult: Pomalidomide  oral chemotherapy education.   PAST MEDICAL HISTORY: Past Medical History:  Diagnosis Date   Atrial fibrillation (HCC)    Diabetes mellitus without complication (HCC)    Hypercholesteremia    Hypertension    Multiple myeloma not having achieved remission [facility])     HEMATOLOGY/ONCOLOGY HISTORY:  Oncology History  Multiple myeloma (HCC)  04/14/2023 Initial Diagnosis   Multiple myeloma   03/13/2020 multiple myeloma panel showed M protein 4.3, IgA lambda Free lamda Level 142,-light chain ratio 0.07 04/05/2023 bone marrow biopsy showed hypercellular bone marrow with plasma cell neoplasm.The bone marrow is hypercellular for age with prominent increase in plasma cells representing 65% of all cells in the aspirate associated with interstitial infiltrates and numerous variably sized aggregates in  the clot and biopsy sections.  The plasma cells display lambda light chain restriction consistent with plasma cell neoplasm.  Normal cytogenetics.  FISH studies pending.    04/14/2023 Cancer Staging   Staging form: Plasma Cell Myeloma and Plasma Cell Disorders, AJCC 8th Edition - Clinical stage from 04/14/2023: RISS Stage II (Beta-2 -microglobulin (mg/L): 8.4, Albumin (g/dL): 2.6, ISS: Stage III, High-risk  cytogenetics: Absent, LDH: Normal) - Signed by Babara Call, MD on 04/22/2023 Stage prefix: Initial diagnosis Beta 2 microglobulin range (mg/L): Greater than or equal to 5.5 Albumin range (g/dL): Less than 3.5 Cytogenetics: 1q addition, Other mutation   04/14/2023 Imaging   Skeletal survey 1. No lytic lesions or other intrinsic bony abnormality. 2. Borderline cardiomegaly with an interval decrease in size. 3. Diffuse atheromatous arterial calcifications including bilateral carotid artery calcifications, right greater than left   04/22/2023 - 11/11/2023 Chemotherapy   Patient is on Treatment Plan : MYELOMA NEWLY DIAGNOSED TRANSPLANT CANDIDATE DaraVRd (Daratumumab  SQ) q21d     04/25/2023 Imaging   PET scan showed  1. Two tiny hypermetabolic lucent lesions in the left posteromedial eighth and ninth ribs. No additional evidence of multiple myeloma. 2. Aortic atherosclerosis (ICD10-I70.0). Coronary artery calcification.   07/14/2023 - 07/19/2023 Hospital Admission   Hospitalized due to ileus.    07/25/2023 Bone Marrow Biopsy   A-C. Peripheral blood and bone marrow, (peripheral smear, aspirate smear, touch preparation, clot section, core biopsy):   Persistent plasma cell myeloma, 50-60% lambda restricted plasma cells on core biopsy. Hypercellular bone marrow (70%) with trilineage hematopoiesis. Negative for increased blasts. Negative for significant dysplasia. Negative for amyloid deposition.   01/25/2024 -  Chemotherapy   Patient is on Treatment Plan : MYELOMA RELAPSED/ REFRACTORY Carfilzomib  D1,8,15 (20/27) + selinexor   + Dexamethasone  (KPd) q28d       ALLERGIES:  has no known allergies.  MEDICATIONS:  Current Outpatient Medications  Medication Sig Dispense Refill   acetaminophen  (TYLENOL ) 325 MG tablet Take 2 tablets (650 mg total) by mouth every 6 (six) hours as needed for mild pain (or Fever >/= 101). 90 tablet 1   acyclovir  (ZOVIRAX ) 400 MG tablet Take 1 tablet (  400 mg total) by mouth 2  (two) times daily. 180 tablet 0   amiodarone  (PACERONE ) 200 MG tablet Take 1 tablet by mouth two times daily for 14 days, then decrease to 1 tablet daily thereafter 60 tablet 1   apixaban  (ELIQUIS ) 5 MG TABS tablet Take 1 tablet (5 mg total) by mouth 2 (two) times daily. 60 tablet 5   atorvastatin  (LIPITOR) 10 MG tablet Take 10 mg by mouth daily.     calcium  carbonate (OS-CAL) 600 MG TABS tablet Take 2 tablets (1,200 mg total) by mouth daily.     cholecalciferol (VITAMIN D3) 25 MCG (1000 UNIT) tablet Take 1 tablet (1,000 Units total) by mouth daily.     cyanocobalamin  (VITAMIN B12) 1000 MCG tablet Take 1 tablet (1,000 mcg total) by mouth daily.     dexamethasone  (DECADRON ) 4 MG tablet Take 5 tablets (20 mg) for 1 day on the week after chemotherapy (day 22). 5 tablet 11   empagliflozin  (JARDIANCE ) 10 MG TABS tablet Take 1 tablet (10 mg total) by mouth daily before breakfast. 30 tablet 2   furosemide  (LASIX ) 40 MG tablet Take 1 tablet (40 mg total) by mouth daily. 30 tablet 3   glipiZIDE (GLUCOTROL XL) 2.5 MG 24 hr tablet Take 5 mg by mouth daily with breakfast. (Patient not taking: Reported on 06/08/2024)     loperamide  (IMODIUM ) 2 MG capsule Take 1 capsule (2 mg total) by mouth See admin instructions. With onset of diarrhea, take 4 mg,then 2 mg every 2 hours or after every loose bowel movements  maximum: 16 mg/day 60 capsule 2   metoprolol  succinate (TOPROL -XL) 25 MG 24 hr tablet Take 0.5 tablets (12.5 mg total) by mouth daily. 45 tablet 1   Multiple Vitamin (MULTIVITAMIN WITH MINERALS) TABS tablet Take 1 tablet by mouth daily. 90 tablet 1   ondansetron  (ZOFRAN ) 8 MG tablet Take 1 tablet (8 mg total) by mouth every 8 (eight) hours as needed for nausea or vomiting. 90 tablet 1   pantoprazole  (PROTONIX ) 40 MG tablet Take 1 tablet (40 mg total) by mouth daily. 30 tablet 1   pomalidomide  (POMALYST ) 2 MG capsule Take 1 capsule (2 mg total) by mouth daily. Take for 21 days, then hold for 7 days. Repeat  every 28 days. 21 capsule 0   prochlorperazine  (COMPAZINE ) 10 MG tablet Take 1 tablet (10 mg total) by mouth every 6 (six) hours as needed for nausea or vomiting. 90 tablet 1   selinexor  (XPOVIO , 60 MG ONCE WEEKLY,) Therapy Pack (60 mg once weekly) Take 1 tablet (60 mg total) by mouth once a week. 4 tablet 4   spironolactone  (ALDACTONE ) 25 MG tablet Take 0.5 tablets (12.5 mg total) by mouth daily. 45 tablet 1   temazepam (RESTORIL) 15 MG capsule Take 15 mg by mouth at bedtime.     No current facility-administered medications for this visit.    VITAL SIGNS: There were no vitals taken for this visit. There were no vitals filed for this visit.  Estimated body mass index is 21.8 kg/m as calculated from the following:   Height as of 05/25/24: 5' 11 (1.803 m).   Weight as of an earlier encounter on 06/08/24: 70.9 kg (156 lb 4.8 oz).  LABS: CBC:    Component Value Date/Time   WBC 3.8 (L) 06/08/2024 0830   WBC 4.3 11/24/2023 0734   HGB 9.5 (L) 06/08/2024 0830   HGB 9.2 (L) 04/20/2024 1207   HCT 28.6 (L) 06/08/2024 0830   HCT  29.8 (L) 04/20/2024 1207   PLT 141 (L) 06/08/2024 0830   PLT 128 (L) 04/20/2024 1207   MCV 107.1 (H) 06/08/2024 0830   MCV 117 (H) 04/20/2024 1207   NEUTROABS 2.6 06/08/2024 0830   LYMPHSABS 0.7 06/08/2024 0830   MONOABS 0.4 06/08/2024 0830   EOSABS 0.1 06/08/2024 0830   BASOSABS 0.0 06/08/2024 0830   Comprehensive Metabolic Panel:    Component Value Date/Time   NA 133 (L) 06/08/2024 0830   NA 142 04/20/2024 1207   K 4.8 06/08/2024 0830   CL 103 06/08/2024 0830   CO2 21 (L) 06/08/2024 0830   BUN 32 (H) 06/08/2024 0830   BUN 20 04/20/2024 1207   CREATININE 1.51 (H) 06/08/2024 0830   GLUCOSE 131 (H) 06/08/2024 0830   CALCIUM  8.4 (L) 06/08/2024 0830   AST 29 06/08/2024 0830   ALT 36 06/08/2024 0830   ALKPHOS 91 06/08/2024 0830   BILITOT 0.8 06/08/2024 0830   PROT 8.8 (H) 06/08/2024 0830   ALBUMIN 3.4 (L) 06/08/2024 0830     Present during today's  visit: Patient and his wife Rock.   Start plan: Start pomalidomide  today.    Patient Education I spoke with patient for overview of new oral chemotherapy medication: pomalidomide .    Treatment goal: Control  Administration: Counseled patient on administration, dosing, side effects, monitoring, drug-food interactions, safe handling, storage, and disposal. Patient will take 1 tablet (2 mg) by mouth once daily for 21 days, then take 7 days off. Repeat cycle every 28 days.  Side Effects: Side effects include but not limited to: fatigue, nausea, constipation, diarrhea, decreased WBC's, decreased platelets, decreased hemoglobin. Diarrhea: instructed patient to use Imodium  (loperamide ) PRN and call if 4 or more loose stools per day despite using Imodium .  Constipation: instructed to use docusate and Senna PRN and call if no bowel movement for 48 hours despite using laxatives.  WBCs: instructed to call cancer center if any fever or signs of infection.   Decreased platelets: instructed to call cancer center if abnormal bleeding noticed at any point.   Drug-drug Interactions (DDI): none identified with current medication list.  Adherence: After discussion with patient no patient barriers to medication adherence identified.  Reviewed with patient importance of keeping a medication schedule and plan for any missed doses.  Distress evaluation: Distress thermometer not completed during in person visit as patient has been on previous lines of therapy.    Communication and Learning Assessment Primary learner: Patient and wife Rock. Barriers to learning: No barriers Preferred language: English Learning preferences: Listening Reading  Mr. Keaney voiced understanding and appreciation. All questions answered. Medication handout provided.  Provided patient with Oral Chemotherapy Navigation Clinic phone number. Patient knows to call the office with questions or concerns. Oral Chemotherapy Navigation  Clinic will continue to follow.  Patient expressed understanding and was in agreement with this plan. He also understands that He can call clinic at any time with any questions, concerns, or complaints.   Medication Access Issues: None identified.   Follow-up plan: RTC as scheduled  Thank you for allowing me to participate in the care of this patient.   Time Total: 15 minutes  Visit consisted of counseling and education on dealing with issues of symptom management in the setting of serious and potentially life-threatening illness.Greater than 50%  of this time was spent counseling and coordinating care related to the above assessment and plan.  Signed by: Karon Heckendorn N. Andrius Andrepont, PharmD, BCOP, CPP Hematology/Oncology Clinical Pharmacist Practitioner [place] [phone]  06/08/2024 10:29 AM

## 2024-06-08 NOTE — Assessment & Plan Note (Signed)
 Encourage oral hydration and avoid nephrotoxins.

## 2024-06-11 LAB — MULTIPLE MYELOMA PANEL, SERUM
Albumin SerPl Elph-Mcnc: 3.4 g/dL (ref 2.9–4.4)
Albumin/Glob SerPl: 0.8 (ref 0.7–1.7)
Alpha 1: 0.2 g/dL (ref 0.0–0.4)
Alpha2 Glob SerPl Elph-Mcnc: 0.6 g/dL (ref 0.4–1.0)
B-Globulin SerPl Elph-Mcnc: 0.9 g/dL (ref 0.7–1.3)
Gamma Glob SerPl Elph-Mcnc: 3.1 g/dL — ABNORMAL HIGH (ref 0.4–1.8)
Globulin, Total: 4.8 g/dL — ABNORMAL HIGH (ref 2.2–3.9)
IgA: 4482 mg/dL — ABNORMAL HIGH (ref 61–437)
IgG (Immunoglobin G), Serum: 336 mg/dL — ABNORMAL LOW (ref 603–1613)
IgM (Immunoglobulin M), Srm: 6 mg/dL — ABNORMAL LOW (ref 20–172)
M Protein SerPl Elph-Mcnc: 2.4 g/dL — ABNORMAL HIGH
Total Protein ELP: 8.2 g/dL (ref 6.0–8.5)

## 2024-06-12 LAB — KAPPA/LAMBDA LIGHT CHAINS
Kappa free light chain: 2.6 mg/L — ABNORMAL LOW (ref 3.3–19.4)
Kappa, lambda light chain ratio: 0 — ABNORMAL LOW (ref 0.26–1.65)
Lambda free light chains: 723 mg/L — ABNORMAL HIGH (ref 5.7–26.3)

## 2024-06-13 ENCOUNTER — Ambulatory Visit
Admission: RE | Admit: 2024-06-13 | Discharge: 2024-06-13 | Disposition: A | Discharge status: Home / Self Care | Source: Ambulatory Visit | Attending: Internal Medicine | Admitting: Internal Medicine

## 2024-06-13 DIAGNOSIS — I6522 Occlusion and stenosis of left carotid artery: Secondary | ICD-10-CM | POA: Diagnosis present

## 2024-06-13 DIAGNOSIS — R55 Syncope and collapse: Secondary | ICD-10-CM | POA: Insufficient documentation

## 2024-06-13 DIAGNOSIS — C9000 Multiple myeloma not having achieved remission: Secondary | ICD-10-CM | POA: Insufficient documentation

## 2024-06-13 MED ORDER — IOHEXOL 350 MG/ML SOLN
75.0000 mL | Freq: Once | INTRAVENOUS | Status: AC | PRN
Start: 2024-06-13 — End: 2024-06-13
  Administered 2024-06-13: 75 mL via INTRAVENOUS

## 2024-06-15 ENCOUNTER — Other Ambulatory Visit

## 2024-06-15 ENCOUNTER — Other Ambulatory Visit: Payer: Self-pay | Admitting: Pharmacist

## 2024-06-15 ENCOUNTER — Inpatient Hospital Stay (HOSPITAL_BASED_OUTPATIENT_CLINIC_OR_DEPARTMENT_OTHER): Admitting: Oncology

## 2024-06-15 ENCOUNTER — Inpatient Hospital Stay: Attending: Oncology

## 2024-06-15 ENCOUNTER — Ambulatory Visit: Admitting: Oncology

## 2024-06-15 ENCOUNTER — Encounter: Payer: Self-pay | Admitting: Oncology

## 2024-06-15 VITALS — BP 111/63 | HR 65 | Temp 96.5°F | Resp 16 | Wt 150.0 lb

## 2024-06-15 DIAGNOSIS — Z7901 Long term (current) use of anticoagulants: Secondary | ICD-10-CM | POA: Diagnosis not present

## 2024-06-15 DIAGNOSIS — I4891 Unspecified atrial fibrillation: Secondary | ICD-10-CM | POA: Insufficient documentation

## 2024-06-15 DIAGNOSIS — D701 Agranulocytosis secondary to cancer chemotherapy: Secondary | ICD-10-CM | POA: Diagnosis not present

## 2024-06-15 DIAGNOSIS — T451X5A Adverse effect of antineoplastic and immunosuppressive drugs, initial encounter: Secondary | ICD-10-CM

## 2024-06-15 DIAGNOSIS — D702 Other drug-induced agranulocytosis: Secondary | ICD-10-CM

## 2024-06-15 DIAGNOSIS — C9000 Multiple myeloma not having achieved remission: Secondary | ICD-10-CM | POA: Diagnosis present

## 2024-06-15 DIAGNOSIS — I5031 Acute diastolic (congestive) heart failure: Secondary | ICD-10-CM | POA: Insufficient documentation

## 2024-06-15 DIAGNOSIS — D6481 Anemia due to antineoplastic chemotherapy: Secondary | ICD-10-CM | POA: Diagnosis not present

## 2024-06-15 DIAGNOSIS — Z1589 Genetic susceptibility to other disease: Secondary | ICD-10-CM | POA: Diagnosis not present

## 2024-06-15 DIAGNOSIS — Z5111 Encounter for antineoplastic chemotherapy: Secondary | ICD-10-CM

## 2024-06-15 DIAGNOSIS — N1832 Chronic kidney disease, stage 3b: Secondary | ICD-10-CM | POA: Diagnosis not present

## 2024-06-15 DIAGNOSIS — N183 Chronic kidney disease, stage 3 unspecified: Secondary | ICD-10-CM | POA: Diagnosis not present

## 2024-06-15 LAB — CBC WITH DIFFERENTIAL (CANCER CENTER ONLY)
Abs Immature Granulocytes: 0.05 K/uL (ref 0.00–0.07)
Basophils Absolute: 0 K/uL (ref 0.0–0.1)
Basophils Relative: 1 %
Eosinophils Absolute: 0 K/uL (ref 0.0–0.5)
Eosinophils Relative: 2 %
HCT: 24.1 % — ABNORMAL LOW (ref 39.0–52.0)
Hemoglobin: 8.1 g/dL — ABNORMAL LOW (ref 13.0–17.0)
Immature Granulocytes: 4 %
Lymphocytes Relative: 18 %
Lymphs Abs: 0.2 K/uL — ABNORMAL LOW (ref 0.7–4.0)
MCH: 35.4 pg — ABNORMAL HIGH (ref 26.0–34.0)
MCHC: 33.6 g/dL (ref 30.0–36.0)
MCV: 105.2 fL — ABNORMAL HIGH (ref 80.0–100.0)
Monocytes Absolute: 0.1 K/uL (ref 0.1–1.0)
Monocytes Relative: 8 %
Neutro Abs: 0.8 K/uL — ABNORMAL LOW (ref 1.7–7.7)
Neutrophils Relative %: 67 %
Platelet Count: 114 K/uL — ABNORMAL LOW (ref 150–400)
RBC: 2.29 MIL/uL — ABNORMAL LOW (ref 4.22–5.81)
RDW: 14.6 % (ref 11.5–15.5)
WBC Count: 1.2 K/uL — ABNORMAL LOW (ref 4.0–10.5)
nRBC: 0 % (ref 0.0–0.2)

## 2024-06-15 LAB — CMP (CANCER CENTER ONLY)
ALT: 31 U/L (ref 0–44)
AST: 28 U/L (ref 15–41)
Albumin: 3.4 g/dL — ABNORMAL LOW (ref 3.5–5.0)
Alkaline Phosphatase: 135 U/L — ABNORMAL HIGH (ref 38–126)
Anion gap: 9 (ref 5–15)
BUN: 51 mg/dL — ABNORMAL HIGH (ref 8–23)
CO2: 19 mmol/L — ABNORMAL LOW (ref 22–32)
Calcium: 8.5 mg/dL — ABNORMAL LOW (ref 8.9–10.3)
Chloride: 103 mmol/L (ref 98–111)
Creatinine: 1.73 mg/dL — ABNORMAL HIGH (ref 0.61–1.24)
GFR, Estimated: 43 mL/min — ABNORMAL LOW (ref >60)
Glucose, Bld: 129 mg/dL — ABNORMAL HIGH (ref 70–99)
Potassium: 4.6 mmol/L (ref 3.5–5.1)
Sodium: 131 mmol/L — ABNORMAL LOW (ref 135–145)
Total Bilirubin: 1.3 mg/dL — ABNORMAL HIGH (ref 0.0–1.2)
Total Protein: 8.9 g/dL — ABNORMAL HIGH (ref 6.5–8.1)

## 2024-06-15 MED ORDER — SELINEXOR (40 MG ONCE WEEKLY) 40 MG PO TBPK
40.0000 mg | ORAL_TABLET | ORAL | 1 refills | Status: DC
Start: 2024-06-15 — End: 2024-06-18

## 2024-06-15 NOTE — Progress Notes (Signed)
 Per Dr. Babara on 06/15/24 dose reducing Xpovio  at 40 weekly with pomalyst  when his ANC recovers. let us  know when he gets the supply and we will arrange lab MD on that day. he starts after seeing me  Rx for new dose sent to [facility] pharmacy

## 2024-06-15 NOTE — Assessment & Plan Note (Signed)
 Encourage oral hydration and avoid nephrotoxins.

## 2024-06-15 NOTE — Assessment & Plan Note (Signed)
 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  Lab Results  Component Value Date   HGB 8.1 (L) 06/15/2024   TIBC 304 03/28/2024   IRONPCTSAT 23 03/28/2024   FERRITIN 535 (H) 03/28/2024    Decreased due to chemotherapy. Monitor.

## 2024-06-15 NOTE — Progress Notes (Signed)
 Hematology/Oncology Progress note [phone] [phone]         Patient Care Team: Cleotilde Oneil FALCON, MD as PCP - General (Internal Medicine) Darliss Rogue, MD as PCP - Cardiology (Cardiology) Kennyth Chew, MD as PCP - Electrophysiology (Clinical Cardiac Electrophysiology) Babara Call, MD as Consulting Physician (Oncology) Riddle, Suzann, NP as Nurse Practitioner (Clinical Cardiac Electrophysiology) Franchester Mikey DEL, PA-C as Physician Assistant (Cardiology)  CHIEF COMPLAINTS/REASON FOR VISIT:  Multiple myeloma   ASSESSMENT & PLAN:   Cancer Staging  Multiple myeloma [facility]) Staging form: Plasma Cell Myeloma and Plasma Cell Disorders, AJCC 8th Edition - Clinical stage from 04/14/2023: RISS Stage II (Beta-2 -microglobulin (mg/L): 8.4, Albumin (g/dL): 2.6, ISS: Stage III, High-risk cytogenetics: Absent, LDH: Normal) - Signed by Babara Call, MD on 04/22/2023   Multiple myeloma (HCC) IgA lamda multiple myeloma, 65% plasma cell involvement, IgA lamda, M protein 4.3,  S/p 8 cycles  Daratumumab  Rvd- refractory disease-  2nd line Carfilzomib  D1, 8 15 Q28 days and Selinexor  60mg  Dexamethasone  weekly. Partial response after 3 cycles. cycle 3 D15 cancelled due to acute CHF  He is doing better. Discussed with cardiology.  [facility] oncology recommendation was reviewed.  Labs are reviewed and discussed with patient. Neutropenia. Hold chemo. Plan to resume treatment with decrease Selinexor  to 40mg , with Pomalyst  2 mg day 1-21, and dexamethasone  20 mg weekly when ANC improves above 1.    Continue Acyclovir , At risk of thrombosis, on Eliquis  5mg  BID for A fib Xegeva monthly if calcium  meets criteria - held since CHF, plan to resume in the future.  Recommend calcium  and vitamin D  supplementation.    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  Lab Results  Component Value Date   HGB 8.1 (L) 06/15/2024    TIBC 304 03/28/2024   IRONPCTSAT 23 03/28/2024   FERRITIN 535 (H) 03/28/2024    Decreased due to chemotherapy. Monitor.   CKD (chronic kidney disease) stage 3, GFR 30-59 ml/min (HCC) Encourage oral hydration and avoid nephrotoxins.    Diastolic CHF, acute (HCC) Follow up with cardiology Patient is on Lasix  20 mg daily.  Creatinine increased to 1.7. He appears euvolemic Discussed with cardiology, recommend patient to hold off Lasix , and only use PRN leg edema.   Encounter for antineoplastic chemotherapy Chemotherapy plan as listed above.   Drug-induced neutropenia Neutropenia precaution.   No orders of the defined types were placed in this encounter.  Follow-up 1 week  We spent sufficient time to discuss many aspect of care, questions were answered to patient's satisfaction.    Call Babara, MD, PhD [facility] Hematology Oncology 06/15/2024     HISTORY OF PRESENTING ILLNESS:  Victor Phillips is a  68 y.o.  male with PMH listed below who was referred to me for anemia  02/11/2023 - 02/14/2023 recent hospitalization due to pneumonia, respiratory failure.  He was found to have a hemoglobin decreased at 6.8, status post PRBC transfusion during admission.  EGD showed gastritis.  Colonoscopy was not remarkable. 02/11/2023 TIBC 221 ferritin 107, iron saturation 16. Patient is currently taking fusion plus Vitamin B12 level in the 300s. His echo showed grade 2 diastolic CHF.  He denies recent chest pain on exertion, shortness of breath on minimal exertion, pre-syncopal episodes, or palpitations He had not noticed any recent bleeding such as epistaxis, hematuria or hematochezia.  He denies over the counter NSAID ingestion.  Oncology History  Multiple myeloma (HCC)  04/14/2023 Initial  Diagnosis   Multiple myeloma   03/13/2020 multiple myeloma panel showed M protein 4.3, IgA lambda Free lamda Level 142,-light chain ratio 0.07 04/05/2023 bone marrow biopsy showed hypercellular bone marrow  with plasma cell neoplasm.The bone marrow is hypercellular for age with prominent increase in plasma cells representing 65% of all cells in the aspirate associated with interstitial infiltrates and numerous variably sized aggregates in  the clot and biopsy sections.  The plasma cells display lambda light chain restriction consistent with plasma cell neoplasm.  Normal cytogenetics.  FISH studies pending.    04/14/2023 Cancer Staging   Staging form: Plasma Cell Myeloma and Plasma Cell Disorders, AJCC 8th Edition - Clinical stage from 04/14/2023: RISS Stage II (Beta-2 -microglobulin (mg/L): 8.4, Albumin (g/dL): 2.6, ISS: Stage III, High-risk cytogenetics: Absent, LDH: Normal) - Signed by Babara Call, MD on 04/22/2023 Stage prefix: Initial diagnosis Beta 2 microglobulin range (mg/L): Greater than or equal to 5.5 Albumin range (g/dL): Less than 3.5 Cytogenetics: 1q addition, Other mutation   04/14/2023 Imaging   Skeletal survey 1. No lytic lesions or other intrinsic bony abnormality. 2. Borderline cardiomegaly with an interval decrease in size. 3. Diffuse atheromatous arterial calcifications including bilateral carotid artery calcifications, right greater than left   04/22/2023 - 11/11/2023 Chemotherapy   Patient is on Treatment Plan : MYELOMA NEWLY DIAGNOSED TRANSPLANT CANDIDATE DaraVRd (Daratumumab  SQ) q21d     04/25/2023 Imaging   PET scan showed  1. Two tiny hypermetabolic lucent lesions in the left posteromedial eighth and ninth ribs. No additional evidence of multiple myeloma. 2. Aortic atherosclerosis (ICD10-I70.0). Coronary artery calcification.   07/14/2023 - 07/19/2023 Hospital Admission   Hospitalized due to ileus.    07/25/2023 Bone Marrow Biopsy   A-C. Peripheral blood and bone marrow, (peripheral smear, aspirate smear, touch preparation, clot section, core biopsy):   Persistent plasma cell myeloma, 50-60% lambda restricted plasma cells on core biopsy. Hypercellular bone marrow (70%) with  trilineage hematopoiesis. Negative for increased blasts. Negative for significant dysplasia. Negative for amyloid deposition.   01/25/2024 -  Chemotherapy   Patient is on Treatment Plan : MYELOMA RELAPSED/ REFRACTORY Carfilzomib  D1,8,15 (20/27) + selinexor   + Dexamethasone  (KPd) q28d       A Fib. on Eliquis  5mg  BID, follows up with cardiology.  Patient reports being compliant on Eliquis .  Patient takes Lasix  20 mg daily. He reports feeling ok today. Weight loss of 6 pounds since last visit. Denies fever, chills, diarrhea, chest pain, nausea vomiting.   MEDICAL HISTORY:  Past Medical History:  Diagnosis Date   Atrial fibrillation (HCC)    Diabetes mellitus without complication (HCC)    Hypercholesteremia    Hypertension    Multiple myeloma not having achieved remission (HCC)     SURGICAL HISTORY: Past Surgical History:  Procedure Laterality Date   CARDIOVERSION N/A 10/17/2023   Procedure: CARDIOVERSION;  Surgeon: Darliss Rogue, MD;  Location: [facility] ORS;  Service: Cardiovascular;  Laterality: N/A;   COLONOSCOPY N/A 02/13/2023   Procedure: COLONOSCOPY;  Surgeon: Toledo, Ladell POUR, MD;  Location: [facility] ENDOSCOPY;  Service: Gastroenterology;  Laterality: N/A;   COLONOSCOPY N/A 02/14/2023   Procedure: COLONOSCOPY;  Surgeon: Toledo, Ladell POUR, MD;  Location: [facility] ENDOSCOPY;  Service: Gastroenterology;  Laterality: N/A;   ESOPHAGOGASTRODUODENOSCOPY N/A 02/13/2023   Procedure: ESOPHAGOGASTRODUODENOSCOPY (EGD);  Surgeon: Toledo, Ladell POUR, MD;  Location: [facility] ENDOSCOPY;  Service: Gastroenterology;  Laterality: N/A;   IR BONE MARROW BIOPSY & ASPIRATION  11/24/2023    SOCIAL HISTORY: Social History   Socioeconomic History  Marital status: Married    Spouse name: Not on file   Number of children: Not on file   Years of education: Not on file   Highest education level: Not on file  Occupational History   Not on file  Tobacco Use   Smoking status: Never   Smokeless tobacco: Never   Vaping Use   Vaping status: Never Used  Substance and Sexual Activity   Alcohol use: Not Currently    Comment: quit approx 2001   Drug use: No   Sexual activity: Not on file  Other Topics Concern   Not on file  Social History Narrative   Not on file   Social Drivers of Health   Financial Resource Strain: Medium Risk (01/13/2024)   Overall Financial Resource Strain (CARDIA)    Difficulty of Paying Living Expenses: Somewhat hard  Food Insecurity: Food Insecurity Present (01/13/2024)   Hunger Vital Sign    Worried About Running Out of Food in the Last Year: Sometimes true    Ran Out of Food in the Last Year: Sometimes true  Transportation Needs: No Transportation Needs (01/13/2024)   PRAPARE - Administrator, Civil Service (Medical): No    Lack of Transportation (Non-Medical): No  Physical Activity: Insufficiently Active (10/12/2023)   Received from [facility]   Exercise Vital Sign    On average, how many days per week do you engage in moderate to strenuous exercise (like a brisk walk)?: 7 days    On average, how many minutes do you engage in exercise at this level?: 10 min  Stress: No Stress Concern Present (10/12/2023)   Received from [facility] Occupational Health - Occupational Stress Questionnaire    Feeling of Stress : Not at all  Recent Concern: Stress - Stress Concern Present (10/04/2023)   Received from [facility] of Occupational Health - Occupational Stress Questionnaire    Feeling of Stress : Rather much  Social Connections: Moderately Integrated (10/12/2023)   Received from [facility]   Social Connection and Isolation Panel    In a typical week, how many times do you talk on the phone with family, friends, or neighbors?: More than three times a week    How often do you get together with friends or relatives?: More than three times a week    How  often do you attend church or religious services?: 1 to 4 times per year    Do you belong to any clubs or organizations such as church groups, unions, fraternal or athletic groups, or school groups?: No    How often do you attend meetings of the clubs or organizations you belong to?: Never    Are you married, widowed, divorced, separated, never married, or living with a partner?: Married  Intimate Partner Violence: Not At Risk (07/14/2023)   Humiliation, Afraid, Rape, and Kick questionnaire    Fear of Current or Ex-Partner: No    Emotionally Abused: No    Physically Abused: No    Sexually Abused: No    FAMILY HISTORY: Family History  Problem Relation Age of Onset   Diabetes Mother    Heart attack Father     ALLERGIES:  has no known allergies.  MEDICATIONS:  Current Outpatient Medications  Medication Sig Dispense Refill   acetaminophen  (TYLENOL ) 325 MG tablet Take 2 tablets (650 mg total) by mouth every 6 (six) hours as needed  for mild pain (or Fever >/= 101). 90 tablet 1   acyclovir  (ZOVIRAX ) 400 MG tablet Take 1 tablet (400 mg total) by mouth 2 (two) times daily. 180 tablet 0   amiodarone  (PACERONE ) 200 MG tablet Take 1 tablet by mouth two times daily for 14 days, then decrease to 1 tablet daily thereafter 60 tablet 1   apixaban  (ELIQUIS ) 5 MG TABS tablet Take 1 tablet (5 mg total) by mouth 2 (two) times daily. 60 tablet 5   atorvastatin  (LIPITOR) 10 MG tablet Take 10 mg by mouth daily.     calcium  carbonate (OS-CAL) 600 MG TABS tablet Take 2 tablets (1,200 mg total) by mouth daily.     cholecalciferol (VITAMIN D3) 25 MCG (1000 UNIT) tablet Take 1 tablet (1,000 Units total) by mouth daily.     cyanocobalamin  (VITAMIN B12) 1000 MCG tablet Take 1 tablet (1,000 mcg total) by mouth daily.     dexamethasone  (DECADRON ) 4 MG tablet Take 5 tablets (20 mg) for 1 day on the week after chemotherapy (day 22). 5 tablet 11   empagliflozin  (JARDIANCE ) 10 MG TABS tablet Take 1 tablet (10 mg total)  by mouth daily before breakfast. 30 tablet 2   furosemide  (LASIX ) 20 MG tablet Take 1 tablet (20 mg total) by mouth daily.     loperamide  (IMODIUM ) 2 MG capsule Take 1 capsule (2 mg total) by mouth See admin instructions. With onset of diarrhea, take 4 mg,then 2 mg every 2 hours or after every loose bowel movements  maximum: 16 mg/day 60 capsule 2   metoprolol  succinate (TOPROL -XL) 25 MG 24 hr tablet Take 0.5 tablets (12.5 mg total) by mouth daily. 45 tablet 1   Multiple Vitamin (MULTIVITAMIN WITH MINERALS) TABS tablet Take 1 tablet by mouth daily. 90 tablet 1   ondansetron  (ZOFRAN ) 8 MG tablet Take 1 tablet (8 mg total) by mouth every 8 (eight) hours as needed for nausea or vomiting. 90 tablet 1   pantoprazole  (PROTONIX ) 40 MG tablet Take 1 tablet (40 mg total) by mouth daily. 30 tablet 1   pomalidomide  (POMALYST ) 2 MG capsule Take 1 capsule (2 mg total) by mouth daily. Take for 21 days, then hold for 7 days. Repeat every 28 days. 21 capsule 0   prochlorperazine  (COMPAZINE ) 10 MG tablet Take 1 tablet (10 mg total) by mouth every 6 (six) hours as needed for nausea or vomiting. 90 tablet 1   spironolactone  (ALDACTONE ) 25 MG tablet Take 0.5 tablets (12.5 mg total) by mouth daily. 45 tablet 1   temazepam (RESTORIL) 15 MG capsule Take 15 mg by mouth at bedtime.     glipiZIDE (GLUCOTROL XL) 2.5 MG 24 hr tablet Take 5 mg by mouth daily with breakfast. (Patient not taking: Reported on 06/15/2024)     selinexor  (XPOVIO ) Therapy Pack (40 mg once weekly) Take 1 tablet (40 mg total) by mouth once a week. 4 tablet 1   No current facility-administered medications for this visit.    Review of Systems  Constitutional:  Positive for fatigue. Negative for appetite change, chills, fever and unexpected weight change.  HENT:   Negative for hearing loss and voice change.   Eyes:  Negative for eye problems and icterus.  Respiratory:  Negative for chest tightness, cough and shortness of breath.   Cardiovascular:   Negative for chest pain and leg swelling.  Gastrointestinal:  Negative for abdominal distention, abdominal pain and diarrhea.  Endocrine: Negative for hot flashes.  Genitourinary:  Negative for difficulty urinating, dysuria  and frequency.   Musculoskeletal:  Negative for arthralgias.  Skin:  Negative for itching and rash.  Neurological:  Negative for light-headedness and numbness.  Hematological:  Negative for adenopathy. Does not bruise/bleed easily.  Psychiatric/Behavioral:  Positive for sleep disturbance. Negative for confusion.     PHYSICAL EXAMINATION: Vitals:   06/15/24 0838  BP: 111/63  Pulse: 65  Resp: 16  Temp: (!) 96.5 F (35.8 C)  SpO2: 99%   Filed Weights   06/15/24 0838  Weight: 150 lb (68 kg)    Physical Exam Constitutional:      General: He is not in acute distress.    Appearance: He is obese.  HENT:     Head: Normocephalic and atraumatic.     Mouth/Throat:     Comments: Dry oral mucosa  Eyes:     General: No scleral icterus. Cardiovascular:     Rate and Rhythm: Normal rate.  Pulmonary:     Effort: Pulmonary effort is normal. No respiratory distress.     Breath sounds: No wheezing.  Abdominal:     General: Bowel sounds are normal. There is no distension.     Palpations: Abdomen is soft.  Musculoskeletal:        General: Normal range of motion.     Cervical back: Normal range of motion and neck supple.     Right lower leg: No edema.     Left lower leg: No edema.  Skin:    General: Skin is warm and dry.     Findings: No erythema or rash.  Neurological:     Mental Status: He is alert and oriented to person, place, and time. Mental status is at baseline.     Cranial Nerves: No cranial nerve deficit.  Psychiatric:        Mood and Affect: Mood normal.      LABORATORY DATA:  I have reviewed the data as listed    Latest Ref Rng & Units 06/15/2024    8:17 AM 06/08/2024    8:30 AM 05/22/2024    8:26 AM  CBC  WBC 4.0 - 10.5 K/uL 1.2  3.8  5.7    Hemoglobin 13.0 - 17.0 g/dL 8.1  9.5  9.4   Hematocrit 39.0 - 52.0 % 24.1  28.6  28.8   Platelets 150 - 400 K/uL 114  141  172       Latest Ref Rng & Units 06/15/2024    8:17 AM 06/08/2024    8:30 AM 05/22/2024    8:26 AM  CMP  Glucose 70 - 99 mg/dL 870  868  837   BUN 8 - 23 mg/dL 51  32  33   Creatinine 0.61 - 1.24 mg/dL 8.26  8.48  8.80   Sodium 135 - 145 mmol/L 131  133  136   Potassium 3.5 - 5.1 mmol/L 4.6  4.8  4.3   Chloride 98 - 111 mmol/L 103  103  104   CO2 22 - 32 mmol/L 19  21  23    Calcium  8.9 - 10.3 mg/dL 8.5  8.4  9.1   Total Protein 6.5 - 8.1 g/dL 8.9  8.8  8.3   Total Bilirubin 0.0 - 1.2 mg/dL 1.3  0.8  0.9   Alkaline Phos 38 - 126 U/L 135  91  160   AST 15 - 41 U/L 28  29  18    ALT 0 - 44 U/L 31  36  23    Lab Results  Component Value Date   IRON 70 03/28/2024   TIBC 304 03/28/2024   IRONPCTSAT 23 03/28/2024   FERRITIN 535 (H) 03/28/2024     RADIOGRAPHIC STUDIES: I have personally reviewed the radiological images as listed and agreed with the findings in the report. CT ANGIO HEAD NECK W WO CM Result Date: 06/14/2024 EXAM: CTA Head and Neck with Intravenous Contrast. CT Head without Contrast. CLINICAL HISTORY: Multiple myeloma not having achieved remission (HCC), Syncope and collapse, Carotid stenosis, left. TECHNIQUE: Axial CTA images of the head and neck performed with intravenous contrast. MIP reconstructed images were created and reviewed. Axial computed tomography images of the head/brain performed without intravenous contrast. Note: Per PQRS, the description of internal carotid artery percent stenosis, including 0 percent or normal exam, is based on [place] American Symptomatic Carotid Endarterectomy Trial (NASCET) criteria. Dose reduction technique was used including one or more of the following: automated exposure control, adjustment of mA and kV according to patient size, and/or iterative reconstruction. CONTRAST: Without and with; 75mL (iohexol  (OMNIPAQUE )  350 MG/ML injection 75 mL IOHEXOL  350 MG/ML SOLN). COMPARISON: CT head 07/14/2023. FINDINGS: CT HEAD: BRAIN: No acute intraparenchymal hemorrhage. No mass lesion. No CT evidence for acute territorial infarct. No midline shift or extra-axial collection. The intracranial internal carotid arteries are patent bilaterally. Atherosclerosis of the bilateral carotid siphons without significant stenosis. The middle cerebral arteries are patent bilaterally. The anterior cerebral arteries are patent bilaterally. VENTRICLES: No hydrocephalus. ORBITS: The orbits are unremarkable. SINUSES AND MASTOIDS: Mild mucosal thickening in the ethmoid sinuses. The mastoid air cells are clear. CTA NECK: COMMON CAROTID ARTERIES: Mild atherosclerosis of the visualized aortic arch. The right common carotid artery is patent from the origin to the skull base. Atherosclerosis of the distal right common carotid artery resulting in mild stenosis. The left common carotid artery is patent from the origin to the skull base. Atherosclerosis along the distal left common carotid artery resulting in mild stenosis. INTERNAL CAROTID ARTERIES: Right: Atherosclerosis at the right carotid bifurcation and proximal right cervical ICA with resulting 50% stenosis of the proximal right cervical ICA. Mild tortuosity of the distal right cervical ICA. Left: Atherosclerosis at the left carotid bifurcation without hemodynamically significant stenosis. VERTEBRAL ARTERIES: The left vertebral artery is dominant. The vertebral arteries are patent from the origins to the vertebrobasilar confluence. No significant stenosis. No dissection or occlusion. CTA HEAD: ANTERIOR CEREBRAL ARTERIES: Patent bilaterally. No significant stenosis. No occlusion. No aneurysm. MIDDLE CEREBRAL ARTERIES: Patent bilaterally. No significant stenosis. No occlusion. No aneurysm. POSTERIOR CEREBRAL ARTERIES: No significant stenosis. No occlusion. No aneurysm. BASILAR ARTERY: No significant stenosis.  No occlusion. No aneurysm. OTHER: SOFT TISSUES: Subcentimeter nodule within the left thyroid lobe. 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 BONES: Edentulous maxilla and mandible. Degenerative changes throughout the visualized spine. Chronic deformity of the lateral aspect of the left clavicle. No acute osseous abnormality. IMPRESSION: 1. No large vessel occlusion. 2. No acute intracranial abnormality. 3. Atherosclerosis at the right carotid bifurcation resulting in 50% stenosis of the proximal right cervical ICA. 4. Atherosclerosis along the distal left common carotid artery resulting in mild stenosis and at the left carotid bifurcation without significant stenosis. Electronically signed by: Donnice Mania MD 06/14/2024 04:48 PM EDT RP Workstation: [id number]   ECHOCARDIOGRAM COMPLETE Result Date: 05/17/2024    ECHOCARDIOGRAM REPORT   Patient Name:   Victor Phillips Date of Exam: 05/17/2024 Medical Rec #:  [id number]  Height:       71.0 in Accession #:    [id number]       Weight:       155.2 lb Date of Birth:  [birth year]       BSA:          1.893 m Patient Age:    67 years         BP:           122/72 mmHg Patient Gender: M                HR:           94 bpm. Exam Location:  [facility] Procedure: 2D Echo, Cardiac Doppler and Color Doppler (Both Spectral and Color            Flow Doppler were utilized during procedure). Indications:    I48.91* Unspeicified atrial fibrillation  History:        Patient has prior history of Echocardiogram examinations, most                 recent 06/14/2023. CHF, Arrythmias:Atrial Fibrillation,                 Signs/Symptoms:Edema; Risk Factors:Hypertension, Diabetes,                 Dyslipidemia and Non-Smoker.  Sonographer:    Damien Drones Referring Phys: [id number] CADENCE H FURTH IMPRESSIONS  1. Left ventricular ejection fraction, by estimation, is 30 to 35%. The left ventricle has moderately decreased  function. The left ventricle demonstrates global hypokinesis. The left ventricular internal cavity size was moderately dilated. Left ventricular diastolic parameters are indeterminate.  2. Right ventricular systolic function is moderately reduced. The right ventricular size is normal. There is severely elevated pulmonary artery systolic pressure. The estimated right ventricular systolic pressure is 73.1 mmHg.  3. Left atrial size was mildly dilated.  4. Right atrial size was mildly dilated.  5. The mitral valve is normal in structure. Mild mitral valve regurgitation. No evidence of mitral stenosis.  6. Tricuspid valve regurgitation is moderate.  7. The aortic valve is normal in structure. Aortic valve regurgitation is not visualized. No aortic stenosis is present.  8. The inferior vena cava is dilated in size with <50% respiratory variability, suggesting right atrial pressure of 15 mmHg. FINDINGS  Left Ventricle: Left ventricular ejection fraction, by estimation, is 30 to 35%. The left ventricle has moderately decreased function. The left ventricle demonstrates global hypokinesis. Strain was performed and the global longitudinal strain is indeterminate. The left ventricular internal cavity size was moderately dilated. There is no left ventricular hypertrophy. Left ventricular diastolic parameters are indeterminate. Right Ventricle: The right ventricular size is normal. No increase in right ventricular wall thickness. Right ventricular systolic function is moderately reduced. There is severely elevated pulmonary artery systolic pressure. The tricuspid regurgitant velocity is 3.81 m/s, and with an assumed right atrial pressure of 15 mmHg, the estimated right ventricular systolic pressure is 73.1 mmHg. Left Atrium: Left atrial size was mildly dilated. Right Atrium: Right atrial size was mildly dilated. Pericardium: There is no evidence of pericardial effusion. Mitral Valve: The mitral valve is normal in structure. Mild  mitral valve regurgitation. No evidence of mitral valve stenosis. MV peak gradient, 7.5 mmHg. The mean mitral valve gradient is 3.0 mmHg. Tricuspid Valve: The tricuspid valve is normal in structure. Tricuspid valve regurgitation is moderate . No evidence of tricuspid stenosis. Aortic Valve: The aortic valve is normal in structure. Aortic  valve regurgitation is not visualized. No aortic stenosis is present. Aortic valve mean gradient measures 4.0 mmHg. Aortic valve peak gradient measures 7.6 mmHg. Aortic valve area, by VTI measures 1.94 cm. Pulmonic Valve: The pulmonic valve was normal in structure. Pulmonic valve regurgitation is not visualized. No evidence of pulmonic stenosis. Aorta: The aortic root is normal in size and structure. Venous: The inferior vena cava is dilated in size with less than 50% respiratory variability, suggesting right atrial pressure of 15 mmHg. IAS/Shunts: No atrial level shunt detected by color flow Doppler. Additional Comments: 3D was performed not requiring image post processing on an independent workstation and was indeterminate.  LEFT VENTRICLE PLAX 2D LVIDd:         5.20 cm LVIDs:         4.20 cm LV PW:         1.00 cm LV IVS:        1.00 cm LVOT diam:     2.20 cm LV SV:         55 LV SV Index:   29 LVOT Area:     3.80 cm  LV Volumes (MOD) LV vol d, MOD A2C: 117.0 ml LV vol d, MOD A4C: 113.0 ml LV vol s, MOD A2C: 69.6 ml LV vol s, MOD A4C: 68.6 ml LV SV MOD A2C:     47.4 ml LV SV MOD A4C:     113.0 ml LV SV MOD BP:      47.1 ml RIGHT VENTRICLE            IVC RV Basal diam:  3.80 cm    IVC diam: 2.30 cm RV Mid diam:    2.70 cm RV S prime:     6.23 cm/s TAPSE (M-mode): 1.0 cm LEFT ATRIUM             Index        RIGHT ATRIUM           Index LA diam:        4.70 cm 2.48 cm/m   RA Area:     22.30 cm LA Vol (A2C):   83.2 ml 43.95 ml/m  RA Volume:   70.20 ml  37.08 ml/m LA Vol (A4C):   77.2 ml 40.78 ml/m LA Biplane Vol: 83.6 ml 44.16 ml/m  AORTIC VALVE                    PULMONIC  VALVE AV Area (Vmax):    2.03 cm     PV Vmax:       0.73 m/s AV Area (Vmean):   1.98 cm     PV Peak grad:  2.1 mmHg AV Area (VTI):     1.94 cm AV Vmax:           138.00 cm/s AV Vmean:          97.000 cm/s AV VTI:            0.286 m AV Peak Grad:      7.6 mmHg AV Mean Grad:      4.0 mmHg LVOT Vmax:         73.70 cm/s LVOT Vmean:        50.400 cm/s LVOT VTI:          0.146 m LVOT/AV VTI ratio: 0.51  AORTA Ao Root diam: 3.50 cm Ao Asc diam:  3.70 cm MITRAL VALVE  TRICUSPID VALVE MV Area VTI:  1.83 cm        TR Peak grad:   58.1 mmHg MV Peak grad: 7.5 mmHg        TR Vmax:        381.00 cm/s MV Mean grad: 3.0 mmHg MV Vmax:      1.37 m/s        SHUNTS MV Vmean:     82.5 cm/s       Systemic VTI:  0.15 m MR Peak grad:    103.4 mmHg   Systemic Diam: 2.20 cm MR Vmax:         508.50 cm/s MR PISA:         5.09 cm MR PISA Eff ROA: 39 mm MR PISA Radius:  0.90 cm Evalene Lunger MD Electronically signed by Evalene Lunger MD Signature Date/Time: 05/17/2024/6:34:21 PM    Final

## 2024-06-15 NOTE — Assessment & Plan Note (Signed)
   Neutropenia precaution.

## 2024-06-15 NOTE — Assessment & Plan Note (Addendum)
 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 Partial response after 3 cycles. cycle 3 D15 cancelled due to acute CHF  He is doing better. Discussed with cardiology.  [facility] recommendation was reviewed.  Labs are reviewed and discussed with patient. Neutropenia. Hold chemo. Plan to resume treatment with decrease Selinexor  to 40mg , with Pomalyst  2 mg day 1-21, and dexamethasone  20 mg weekly when ANC improves above 1.    Continue Acyclovir , At risk of thrombosis, on Eliquis  5mg  BID for A fib Xegeva monthly if calcium  meets criteria - held since CHF, plan to resume in the future.  Recommend calcium  and vitamin D  supplementation.

## 2024-06-15 NOTE — Assessment & Plan Note (Addendum)
 Follow up with cardiology Patient is on Lasix  20 mg daily.  Creatinine increased to 1.7. He appears euvolemic Discussed with cardiology, recommend patient to hold off Lasix , and only use PRN leg edema.

## 2024-06-15 NOTE — Assessment & Plan Note (Signed)
 Chemotherapy plan as listed above

## 2024-06-18 ENCOUNTER — Telehealth: Payer: Self-pay | Admitting: *Deleted

## 2024-06-18 ENCOUNTER — Inpatient Hospital Stay

## 2024-06-18 VITALS — BP 101/65 | HR 62 | Temp 96.5°F | Resp 16 | Wt 153.7 lb

## 2024-06-18 DIAGNOSIS — C9000 Multiple myeloma not having achieved remission: Secondary | ICD-10-CM

## 2024-06-18 MED ORDER — ACETAMINOPHEN 325 MG PO TABS
650.0000 mg | ORAL_TABLET | Freq: Four times a day (QID) | ORAL | Status: DC | PRN
  Administered 2024-06-18: 650 mg via ORAL
  Filled 2024-06-18: qty 2

## 2024-06-18 MED ORDER — DEXTROSE 5 % IV SOLN
INTRAVENOUS | Status: DC
  Filled 2024-06-18: qty 250

## 2024-06-18 MED ORDER — IMMUNE GLOBULIN (HUMAN) 20 GM/200ML IV SOLN
1.0000 g/kg | INTRAVENOUS | Status: DC | PRN
  Administered 2024-06-18: 70 g via INTRAVENOUS
  Filled 2024-06-18: qty 400

## 2024-06-18 MED ORDER — DIPHENHYDRAMINE HCL 25 MG PO CAPS
25.0000 mg | ORAL_CAPSULE | Freq: Four times a day (QID) | ORAL | Status: DC | PRN
  Administered 2024-06-18: 25 mg via ORAL
  Filled 2024-06-18: qty 1

## 2024-06-18 MED ORDER — XPOVIO (40 MG ONCE WEEKLY) 10 MG PO TBPK: 40.0000 mg | ORAL_TABLET | ORAL | 1 refills | Status: DC

## 2024-06-18 NOTE — Telephone Encounter (Signed)
 Or have the 40 mg medicine is been discontinued. You need to 10 mg and that have to take 4 tablets each time,the RX needs to set it up with the new 10 mg and take 4 pills weekly. Please send correct the 10 mg

## 2024-06-18 NOTE — Patient Instructions (Signed)
 Immune Globulin  Injection What is this medication? IMMUNE GLOBULIN  (im MUNE GLOB yoo lin) treats many immune system conditions. It works by Designer, multimedia extra antibodies. Antibodies are proteins made by the immune system that help protect the body. This medicine may be used for other purposes; ask your health care provider or pharmacist if you have questions. COMMON BRAND NAME(S): ASCENIV, Baygam, BIVIGAM, Carimune, Carimune NF, cutaquig, Cuvitru, Flebogamma, Flebogamma DIF, GamaSTAN, GamaSTAN S/D, Gamimune N, Gammagard, Gammagard S/D, Gammaked, Gammaplex, Gammar-P IV, Gamunex, Gamunex-C, Hizentra, Iveegam, Iveegam EN, Octagam, Panglobulin, Panglobulin NF, panzyga, Polygam S/D, Privigen , Sandoglobulin, Venoglobulin-S, Vigam, Vivaglobulin, Xembify What should I tell my care team before I take this medication? They need to know if you have any of these conditions: Blood clotting disorder Condition where you have excess fluid in your body, such as heart failure or edema Dehydration Diabetes Have had blood clots Heart disease Immune system conditions Kidney disease Low levels of IgA Recent or upcoming vaccine An unusual or allergic reaction to immune globulin , other medications, foods, dyes, or preservatives Pregnant or trying to get pregnant Breastfeeding How should I use this medication? This medication is infused into a vein or under the skin. It may also be injected into a muscle. It is usually given by your care team in a hospital or clinic setting. It may also be given at home. If you get this medication at home, you will be taught how to prepare and give it. Take it as directed on the prescription label. Keep taking it unless your care team tells you to stop. It is important that you put your used needles and syringes in a special sharps container. Do not put them in a trash can. If you do not have a sharps container, call your pharmacist or care team to get one. Talk to your care team  about the use of this medication in children. While it may be given to children for selected conditions, precautions do apply. Overdosage: If you think you have taken too much of this medicine contact a poison control center or emergency room at once. NOTE: This medicine is only for you. Do not share this medicine with others. What if I miss a dose? If you get this medication at the hospital or clinic: It is important not to miss your dose. Call your care team if you are unable to keep an appointment. If you give yourself this medication at home: If you miss a dose, take it as soon as you can. Then continue your normal schedule. If it is almost time for your next dose, take only that dose. Do not take double or extra doses. Call your care team with questions. What may interact with this medication? Live virus vaccines This list may not describe all possible interactions. Give your health care provider a list of all the medicines, herbs, non-prescription drugs, or dietary supplements you use. Also tell them if you smoke, drink alcohol , or use illegal drugs. Some items may interact with your medicine. What should I watch for while using this medication? Your condition will be monitored carefully while you are receiving this medication. Tell your care team if your symptoms do not start to get better or if they get worse. You may need blood work done while you are taking this medication. This medication increases the risk of blood clots. People with heart, blood vessel, or blood clotting conditions are more likely to develop a blood clot. Other risk factors include advanced age, estrogen use, tobacco  use, lack of movement, and being overweight. This medication can decrease the response to a vaccine. If you need to get vaccinated, tell your care team if you have received this medication within the last year. Extra booster doses may be needed. Talk to your care team to see if a different vaccination schedule  is needed. This medication is made from donated human blood. There is a small risk it may contain bacteria or viruses, such as hepatitis or HIV. All products are processed to kill most bacteria and viruses. Talk to your care team if you have questions about the risk of infection. If you have diabetes, talk to your care team about which device you should use to check your blood sugar. This medication may cause some devices to report falsely high blood sugar levels. This may cause you to react by not treating a low blood sugar level or by giving an insulin  dose that was not needed. This can cause severe low blood sugar levels. What side effects may I notice from receiving this medication? Side effects that you should report to your care team as soon as possible: Allergic reactions--skin rash, itching, hives, swelling of the face, lips, tongue, or throat Blood clot--pain, swelling, or warmth in the leg, shortness of breath, chest pain Fever, neck pain or stiffness, sensitivity to light, headache, nausea, vomiting, confusion, which may be signs of meningitis Hemolytic anemia--unusual weakness or fatigue, dizziness, headache, trouble breathing, dark urine, yellowing skin or eyes Kidney injury--decrease in the amount of urine, swelling of the ankles, hands, or feet Low sodium level--muscle weakness, fatigue, dizziness, headache, confusion Shortness of breath or trouble breathing, cough, unusual weakness or fatigue, blue skin or lips Side effects that usually do not require medical attention (report these to your care team if they continue or are bothersome): Chills Diarrhea Fever Headache Nausea This list may not describe all possible side effects. Call your doctor for medical advice about side effects. You may report side effects to FDA at 1-800-FDA-1088. Where should I keep my medication? Keep out of the reach of children and pets. You will be instructed on how to store this medication. Get rid of  any unused medication after the expiration date. To get rid of medications that are no longer needed or have expired: Take the medication to a medication take-back program. Check with your pharmacy or law enforcement to find a location. If you cannot return the medication, ask your pharmacist or care team how to get rid of this medication safely. NOTE: This sheet is a summary. It may not cover all possible information. If you have questions about this medicine, talk to your doctor, pharmacist, or health care provider.  2025 Elsevier/Gold Standard (2023-11-14 00:00:00)

## 2024-06-18 NOTE — Telephone Encounter (Signed)
 Updated Rx using the 10mg  tabs sent to [facility] per pharmacy's request.

## 2024-06-18 NOTE — Telephone Encounter (Signed)
 Clarified with Joen and she said [facility] told her that Xpovio  40mg  tabs have been discontinued and they only have 10mg  tabs available. So, order will need to be changed to 10mg  (take 4 tabs weekly). Alyson will you need to resend rx or is this something I can do?

## 2024-06-19 NOTE — Telephone Encounter (Signed)
 Oral Oncology Patient Advocate Victor Phillips spoke with Victor Phillips, pharmacist at [facility], to make sure the prescription they received for the 10 mg tabs looked correct.   Per Victor Phillips, the medication looks good and will be expedited to have the patient schedule delivery as soon as possible. No further action is needed.  [name] ([name]) [name], CPhT  [facility], [name], [facility] - Oral Chemotherapy Patient Advocate Special[facility] Phone: [phone]  Fax: [phone]

## 2024-06-20 ENCOUNTER — Encounter: Payer: Self-pay | Admitting: Oncology

## 2024-06-20 ENCOUNTER — Other Ambulatory Visit: Payer: Self-pay

## 2024-06-20 DIAGNOSIS — C9000 Multiple myeloma not having achieved remission: Secondary | ICD-10-CM

## 2024-06-20 NOTE — Telephone Encounter (Addendum)
 Spoke to pt and he has received his Xpovio  40mg .   Please schedule appt on Friday 10/10: labs (8:30)/ MD (8:45). Pt aware of appts

## 2024-06-22 ENCOUNTER — Inpatient Hospital Stay

## 2024-06-22 ENCOUNTER — Inpatient Hospital Stay (HOSPITAL_BASED_OUTPATIENT_CLINIC_OR_DEPARTMENT_OTHER): Admitting: Oncology

## 2024-06-22 ENCOUNTER — Encounter: Payer: Self-pay | Admitting: Oncology

## 2024-06-22 ENCOUNTER — Other Ambulatory Visit

## 2024-06-22 VITALS — BP 122/52 | HR 51 | Temp 97.3°F | Resp 18 | Wt 158.2 lb

## 2024-06-22 DIAGNOSIS — D6481 Anemia due to antineoplastic chemotherapy: Secondary | ICD-10-CM | POA: Diagnosis not present

## 2024-06-22 DIAGNOSIS — C9000 Multiple myeloma not having achieved remission: Secondary | ICD-10-CM | POA: Diagnosis not present

## 2024-06-22 DIAGNOSIS — N1832 Chronic kidney disease, stage 3b: Secondary | ICD-10-CM | POA: Diagnosis not present

## 2024-06-22 DIAGNOSIS — I5031 Acute diastolic (congestive) heart failure: Secondary | ICD-10-CM | POA: Diagnosis not present

## 2024-06-22 DIAGNOSIS — T451X5A Adverse effect of antineoplastic and immunosuppressive drugs, initial encounter: Secondary | ICD-10-CM

## 2024-06-22 DIAGNOSIS — D702 Other drug-induced agranulocytosis: Secondary | ICD-10-CM

## 2024-06-22 LAB — CMP (CANCER CENTER ONLY)
ALT: 16 U/L (ref 0–44)
AST: 27 U/L (ref 15–41)
Albumin: 3.2 g/dL — ABNORMAL LOW (ref 3.5–5.0)
Alkaline Phosphatase: 105 U/L (ref 38–126)
Anion gap: 7 (ref 5–15)
BUN: 28 mg/dL — ABNORMAL HIGH (ref 8–23)
CO2: 19 mmol/L — ABNORMAL LOW (ref 22–32)
Calcium: 7.6 mg/dL — ABNORMAL LOW (ref 8.9–10.3)
Chloride: 107 mmol/L (ref 98–111)
Creatinine: 1.31 mg/dL — ABNORMAL HIGH (ref 0.61–1.24)
GFR, Estimated: >60 mL/min (ref >60)
Glucose, Bld: 99 mg/dL (ref 70–99)
Potassium: 4.1 mmol/L (ref 3.5–5.1)
Sodium: 133 mmol/L — ABNORMAL LOW (ref 135–145)
Total Bilirubin: 1 mg/dL (ref 0.0–1.2)
Total Protein: 9 g/dL — ABNORMAL HIGH (ref 6.5–8.1)

## 2024-06-22 LAB — CBC WITH DIFFERENTIAL (CANCER CENTER ONLY)
Abs Immature Granulocytes: 0.01 K/uL (ref 0.00–0.07)
Basophils Absolute: 0 K/uL (ref 0.0–0.1)
Basophils Relative: 0 %
Eosinophils Absolute: 0.1 K/uL (ref 0.0–0.5)
Eosinophils Relative: 5 %
HCT: 24.3 % — ABNORMAL LOW (ref 39.0–52.0)
Hemoglobin: 7.9 g/dL — ABNORMAL LOW (ref 13.0–17.0)
Immature Granulocytes: 1 %
Lymphocytes Relative: 32 %
Lymphs Abs: 0.4 K/uL — ABNORMAL LOW (ref 0.7–4.0)
MCH: 34.6 pg — ABNORMAL HIGH (ref 26.0–34.0)
MCHC: 32.5 g/dL (ref 30.0–36.0)
MCV: 106.6 fL — ABNORMAL HIGH (ref 80.0–100.0)
Monocytes Absolute: 0.5 K/uL (ref 0.1–1.0)
Monocytes Relative: 35 %
Neutro Abs: 0.4 K/uL — CL (ref 1.7–7.7)
Neutrophils Relative %: 27 %
Platelet Count: 125 K/uL — ABNORMAL LOW (ref 150–400)
RBC: 2.28 MIL/uL — ABNORMAL LOW (ref 4.22–5.81)
RDW: 15.9 % — ABNORMAL HIGH (ref 11.5–15.5)
WBC Count: 1.4 K/uL — ABNORMAL LOW (ref 4.0–10.5)
nRBC: 0 % (ref 0.0–0.2)

## 2024-06-22 MED ORDER — FILGRASTIM-SNDZ 480 MCG/0.8ML IJ SOSY
480.0000 ug | PREFILLED_SYRINGE | Freq: Every day | INTRAMUSCULAR | Status: DC
  Administered 2024-06-22: 480 ug via SUBCUTANEOUS
  Filled 2024-06-22: qty 0.8

## 2024-06-22 NOTE — Assessment & Plan Note (Signed)
 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 Partial response after 3 cycles. cycle 3 D15 cancelled due to acute CHF  He is doing better. Discussed with cardiology.  [facility]ology recommendation was reviewed.  Labs are reviewed and discussed with patient. Neutropenia. Hold chemo. Plan to resume treatment with decrease Selinexor  to 40mg , with Pomalyst  2 mg day 1-21, and dexamethasone  20 mg weekly when ANC improves above 1.    Continue Acyclovir , At risk of thrombosis, on Eliquis  5mg  BID for A fib Xegeva monthly if calcium  meets criteria - held since CHF, plan to resume in the future.  Recommend calcium  and vitamin D  supplementation.

## 2024-06-22 NOTE — Progress Notes (Signed)
 Hematology/Oncology Progress note [phone] [phone]         Patient Care Team: Cleotilde Oneil FALCON, MD as PCP - General (Internal Medicine) Darliss Rogue, MD as PCP - Cardiology (Cardiology) Kennyth Chew, MD as PCP - Electrophysiology (Clinical Cardiac Electrophysiology) Babara Call, MD as Consulting Physician (Oncology) Riddle, Suzann, NP as Nurse Practitioner (Clinical Cardiac Electrophysiology) Franchester Mikey DEL, PA-C as Physician Assistant (Cardiology)  CHIEF COMPLAINTS/REASON FOR VISIT:  Multiple myeloma   ASSESSMENT & PLAN:   Cancer Staging  Multiple myeloma [facility]) Staging form: Plasma Cell Myeloma and Plasma Cell Disorders, AJCC 8th Edition - Clinical stage from 04/14/2023: RISS Stage II (Beta-2 -microglobulin (mg/L): 8.4, Albumin (g/dL): 2.6, ISS: Stage III, High-risk cytogenetics: Absent, LDH: Normal) - Signed by Babara Call, MD on 04/22/2023   Multiple myeloma (HCC) IgA lamda multiple myeloma, 65% plasma cell involvement, IgA lamda, M protein 4.3,  S/p 8 cycles  Daratumumab  Rvd- refractory disease-  2nd line Carfilzomib  D1, 8 15 Q28 days and Selinexor  60mg  Dexamethasone  weekly. Partial response after 3 cycles. cycle 3 D15 cancelled due to acute CHF  He is doing better. Discussed with cardiology.  [facility] oncology recommendation was reviewed.  Labs are reviewed and discussed with patient. Neutropenia. Hold chemo. Plan to resume treatment with decrease Selinexor  to 40mg , with Pomalyst  2 mg day 1-21, and dexamethasone  20 mg weekly when ANC improves above 1.    Continue Acyclovir , At risk of thrombosis, on Eliquis  5mg  BID for A fib Xegeva monthly if calcium  meets criteria - held since CHF, plan to resume in the future.  Recommend calcium  and vitamin D  supplementation.    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  Lab Results  Component Value Date   HGB 7.9 (L) 06/22/2024    TIBC 304 03/28/2024   IRONPCTSAT 23 03/28/2024   FERRITIN 535 (H) 03/28/2024    Decreased due to chemotherapy. Monitor.   CKD (chronic kidney disease) stage 3, GFR 30-59 ml/min (HCC) Encourage oral hydration and avoid nephrotoxins.  Improved after holding Lasix     Diastolic CHF, acute (HCC) Follow up with cardiology Patient is on Lasix  20 mg daily.  Creatinine increased to 1.7. He appears euvolemic Previously discussed with cardiology, recommend patient to hold off Lasix , and only use PRN leg edema.   Drug-induced neutropenia Neutropenia precaution.  GCSF today.   No orders of the defined types were placed in this encounter.  Follow-up 2 weeks  We spent sufficient time to discuss many aspect of care, questions were answered to patient's satisfaction.    Call Babara, MD, PhD [facility] Hematology Oncology 06/22/2024     HISTORY OF PRESENTING ILLNESS:  Victor Phillips is a  68 y.o.  male with PMH listed below who was referred to me for anemia  02/11/2023 - 02/14/2023 recent hospitalization due to pneumonia, respiratory failure.  He was found to have a hemoglobin decreased at 6.8, status post PRBC transfusion during admission.  EGD showed gastritis.  Colonoscopy was not remarkable. 02/11/2023 TIBC 221 ferritin 107, iron saturation 16. Patient is currently taking fusion plus Vitamin B12 level in the 300s. His echo showed grade 2 diastolic CHF.  He denies recent chest pain on exertion, shortness of breath on minimal exertion, pre-syncopal episodes, or palpitations He had not noticed any recent bleeding such as epistaxis, hematuria or hematochezia.  He denies over the counter NSAID ingestion.  Oncology History  Multiple myeloma (HCC)  04/14/2023 Initial Diagnosis  Multiple myeloma   03/13/2020 multiple myeloma panel showed M protein 4.3, IgA lambda Free lamda Level 142,-light chain ratio 0.07 04/05/2023 bone marrow biopsy showed hypercellular bone marrow with plasma cell  neoplasm.The bone marrow is hypercellular for age with prominent increase in plasma cells representing 65% of all cells in the aspirate associated with interstitial infiltrates and numerous variably sized aggregates in  the clot and biopsy sections.  The plasma cells display lambda light chain restriction consistent with plasma cell neoplasm.  Normal cytogenetics.  FISH studies pending.    04/14/2023 Cancer Staging   Staging form: Plasma Cell Myeloma and Plasma Cell Disorders, AJCC 8th Edition - Clinical stage from 04/14/2023: RISS Stage II (Beta-2 -microglobulin (mg/L): 8.4, Albumin (g/dL): 2.6, ISS: Stage III, High-risk cytogenetics: Absent, LDH: Normal) - Signed by Babara Call, MD on 04/22/2023 Stage prefix: Initial diagnosis Beta 2 microglobulin range (mg/L): Greater than or equal to 5.5 Albumin range (g/dL): Less than 3.5 Cytogenetics: 1q addition, Other mutation   04/14/2023 Imaging   Skeletal survey 1. No lytic lesions or other intrinsic bony abnormality. 2. Borderline cardiomegaly with an interval decrease in size. 3. Diffuse atheromatous arterial calcifications including bilateral carotid artery calcifications, right greater than left   04/22/2023 - 11/11/2023 Chemotherapy   Patient is on Treatment Plan : MYELOMA NEWLY DIAGNOSED TRANSPLANT CANDIDATE DaraVRd (Daratumumab  SQ) q21d     04/25/2023 Imaging   PET scan showed  1. Two tiny hypermetabolic lucent lesions in the left posteromedial eighth and ninth ribs. No additional evidence of multiple myeloma. 2. Aortic atherosclerosis (ICD10-I70.0). Coronary artery calcification.   07/14/2023 - 07/19/2023 Hospital Admission   Hospitalized due to ileus.    07/25/2023 Bone Marrow Biopsy   A-C. Peripheral blood and bone marrow, (peripheral smear, aspirate smear, touch preparation, clot section, core biopsy):   Persistent plasma cell myeloma, 50-60% lambda restricted plasma cells on core biopsy. Hypercellular bone marrow (70%) with trilineage  hematopoiesis. Negative for increased blasts. Negative for significant dysplasia. Negative for amyloid deposition.   01/25/2024 -  Chemotherapy   Patient is on Treatment Plan : MYELOMA RELAPSED/ REFRACTORY Carfilzomib  D1,8,15 (20/27) + selinexor   + Dexamethasone  (KPd) q28d       A Fib. on Eliquis  5mg  BID, follows up with cardiology.  Patient reports being compliant on Eliquis .  Patient is off Lasix  20 mg for most days, only uses PRN leg edema.  He reports feeling more tired today. Weight gain 8 pounds.  since last visit. Denies fever, chills, diarrhea, chest pain, nausea vomiting.   MEDICAL HISTORY:  Past Medical History:  Diagnosis Date   Atrial fibrillation (HCC)    Diabetes mellitus without complication (HCC)    Hypercholesteremia    Hypertension    Multiple myeloma not having achieved remission (HCC)     SURGICAL HISTORY: Past Surgical History:  Procedure Laterality Date   CARDIOVERSION N/A 10/17/2023   Procedure: CARDIOVERSION;  Surgeon: Darliss Rogue, MD;  Location: [facility] ORS;  Service: Cardiovascular;  Laterality: N/A;   COLONOSCOPY N/A 02/13/2023   Procedure: COLONOSCOPY;  Surgeon: Toledo, Ladell POUR, MD;  Location: [facility] ENDOSCOPY;  Service: Gastroenterology;  Laterality: N/A;   COLONOSCOPY N/A 02/14/2023   Procedure: COLONOSCOPY;  Surgeon: Toledo, Ladell POUR, MD;  Location: [facility] ENDOSCOPY;  Service: Gastroenterology;  Laterality: N/A;   ESOPHAGOGASTRODUODENOSCOPY N/A 02/13/2023   Procedure: ESOPHAGOGASTRODUODENOSCOPY (EGD);  Surgeon: Toledo, Ladell POUR, MD;  Location: [facility] ENDOSCOPY;  Service: Gastroenterology;  Laterality: N/A;   IR BONE MARROW BIOPSY & ASPIRATION  11/24/2023    SOCIAL HISTORY: Social  History   Socioeconomic History   Marital status: Married    Spouse name: Not on file   Number of children: Not on file   Years of education: Not on file   Highest education level: Not on file  Occupational History   Not on file  Tobacco Use   Smoking status: Never    Smokeless tobacco: Never  Vaping Use   Vaping status: Never Used  Substance and Sexual Activity   Alcohol use: Not Currently    Comment: quit approx 2001   Drug use: No   Sexual activity: Not on file  Other Topics Concern   Not on file  Social History Narrative   Not on file   Social Drivers of Health   Financial Resource Strain: Medium Risk (01/13/2024)   Overall Financial Resource Strain (CARDIA)    Difficulty of Paying Living Expenses: Somewhat hard  Food Insecurity: Food Insecurity Present (01/13/2024)   Hunger Vital Sign    Worried About Running Out of Food in the Last Year: Sometimes true    Ran Out of Food in the Last Year: Sometimes true  Transportation Needs: No Transportation Needs (01/13/2024)   PRAPARE - Administrator, Civil Service (Medical): No    Lack of Transportation (Non-Medical): No  Physical Activity: Insufficiently Active (10/12/2023)   Received from [facility]   Exercise Vital Sign    On average, how many days per week do you engage in moderate to strenuous exercise (like a brisk walk)?: 7 days    On average, how many minutes do you engage in exercise at this level?: 10 min  Stress: No Stress Concern Present (10/12/2023)   Received from [facility] - Occupational Stress Questionnaire    Feeling of Stress : Not at all  Recent Concern: Stress - Stress Concern Present (10/04/2023)   Received from [facility] - Occupational Stress Questionnaire    Feeling of Stress : Rather much  Social Connections: Moderately Integrated (10/12/2023)   Received from [facility]   Social Connection and Isolation Panel    In a typical week, how many times do you talk on the phone with family, friends, or neighbors?: More than three times a week    How often do you get together with friends or relatives?: More than  three times a week    How often do you attend church or religious services?: 1 to 4 times per year    Do you belong to any clubs or organizations such as church groups, unions, fraternal or athletic groups, or school groups?: No    How often do you attend meetings of the clubs or organizations you belong to?: Never    Are you married, widowed, divorced, separated, never married, or living with a partner?: Married  Intimate Partner Violence: Not At Risk (07/14/2023)   Humiliation, Afraid, Rape, and Kick questionnaire    Fear of Current or Ex-Partner: No    Emotionally Abused: No    Physically Abused: No    Sexually Abused: No    FAMILY HISTORY: Family History  Problem Relation Age of Onset   Diabetes Mother    Heart attack Father     ALLERGIES:  has no known allergies.  MEDICATIONS:  Current Outpatient Medications  Medication Sig Dispense Refill   acetaminophen  (TYLENOL ) 325 MG tablet Take 2 tablets (650 mg total) by  mouth every 6 (six) hours as needed for mild pain (or Fever >/= 101). 90 tablet 1   acyclovir  (ZOVIRAX ) 400 MG tablet Take 1 tablet (400 mg total) by mouth 2 (two) times daily. 180 tablet 0   amiodarone  (PACERONE ) 200 MG tablet Take 1 tablet by mouth two times daily for 14 days, then decrease to 1 tablet daily thereafter 60 tablet 1   apixaban  (ELIQUIS ) 5 MG TABS tablet Take 1 tablet (5 mg total) by mouth 2 (two) times daily. 60 tablet 5   atorvastatin  (LIPITOR) 10 MG tablet Take 10 mg by mouth daily.     calcium  carbonate (OS-CAL) 600 MG TABS tablet Take 2 tablets (1,200 mg total) by mouth daily.     cholecalciferol (VITAMIN D3) 25 MCG (1000 UNIT) tablet Take 1 tablet (1,000 Units total) by mouth daily.     cyanocobalamin  (VITAMIN B12) 1000 MCG tablet Take 1 tablet (1,000 mcg total) by mouth daily.     dexamethasone  (DECADRON ) 4 MG tablet Take 5 tablets (20 mg) for 1 day on the week after chemotherapy (day 22). 5 tablet 11   empagliflozin  (JARDIANCE ) 10 MG TABS tablet  Take 1 tablet (10 mg total) by mouth daily before breakfast. 30 tablet 2   glipiZIDE (GLUCOTROL XL) 2.5 MG 24 hr tablet Take 5 mg by mouth daily with breakfast.     loperamide  (IMODIUM ) 2 MG capsule Take 1 capsule (2 mg total) by mouth See admin instructions. With onset of diarrhea, take 4 mg,then 2 mg every 2 hours or after every loose bowel movements  maximum: 16 mg/day 60 capsule 2   metoprolol  succinate (TOPROL -XL) 25 MG 24 hr tablet Take 0.5 tablets (12.5 mg total) by mouth daily. 45 tablet 1   Multiple Vitamin (MULTIVITAMIN WITH MINERALS) TABS tablet Take 1 tablet by mouth daily. 90 tablet 1   ondansetron  (ZOFRAN ) 8 MG tablet Take 1 tablet (8 mg total) by mouth every 8 (eight) hours as needed for nausea or vomiting. 90 tablet 1   pantoprazole  (PROTONIX ) 40 MG tablet Take 1 tablet (40 mg total) by mouth daily. 30 tablet 1   pomalidomide  (POMALYST ) 2 MG capsule Take 1 capsule (2 mg total) by mouth daily. Take for 21 days, then hold for 7 days. Repeat every 28 days. 21 capsule 0   prochlorperazine  (COMPAZINE ) 10 MG tablet Take 1 tablet (10 mg total) by mouth every 6 (six) hours as needed for nausea or vomiting. 90 tablet 1   temazepam (RESTORIL) 15 MG capsule Take 15 mg by mouth at bedtime.     furosemide  (LASIX ) 20 MG tablet Take 1 tablet (20 mg total) by mouth daily. (Patient not taking: Reported on 06/22/2024)     Selinexor , 40 MG Once Weekly, (XPOVIO , 40 MG ONCE WEEKLY,) 10 MG TBPK Take 40 mg by mouth once a week. (Patient not taking: Reported on 06/22/2024) 16 each 1   spironolactone  (ALDACTONE ) 25 MG tablet Take 0.5 tablets (12.5 mg total) by mouth daily. (Patient not taking: Reported on 06/22/2024) 45 tablet 1   No current facility-administered medications for this visit.   Facility-Administered Medications Ordered in Other Visits  Medication Dose Route Frequency Provider Last Rate Last Admin   filgrastim-sndz (ZARXIO) injection 480 mcg  480 mcg Subcutaneous Daily Babara Call, MD         Review of Systems  Constitutional:  Positive for fatigue. Negative for appetite change, chills, fever and unexpected weight change.  HENT:   Negative for hearing loss and voice change.  Eyes:  Negative for eye problems and icterus.  Respiratory:  Negative for chest tightness, cough and shortness of breath.   Cardiovascular:  Negative for chest pain and leg swelling.  Gastrointestinal:  Negative for abdominal distention, abdominal pain and diarrhea.  Endocrine: Negative for hot flashes.  Genitourinary:  Negative for difficulty urinating, dysuria and frequency.   Musculoskeletal:  Negative for arthralgias.  Skin:  Negative for itching and rash.  Neurological:  Negative for light-headedness and numbness.  Hematological:  Negative for adenopathy. Does not bruise/bleed easily.  Psychiatric/Behavioral:  Positive for sleep disturbance. Negative for confusion.     PHYSICAL EXAMINATION: Vitals:   06/22/24 0848  BP: (!) 122/52  Pulse: (!) 51  Resp: 18  Temp: (!) 97.3 F (36.3 C)   Filed Weights   06/22/24 0848  Weight: 158 lb 3.2 oz (71.8 kg)    Physical Exam Constitutional:      General: He is not in acute distress.    Appearance: He is obese.  HENT:     Head: Normocephalic and atraumatic.     Mouth/Throat:     Comments: Dry oral mucosa  Eyes:     General: No scleral icterus. Cardiovascular:     Rate and Rhythm: Normal rate.  Pulmonary:     Effort: Pulmonary effort is normal. No respiratory distress.     Breath sounds: No wheezing.  Abdominal:     General: Bowel sounds are normal. There is no distension.     Palpations: Abdomen is soft.  Musculoskeletal:        General: Normal range of motion.     Cervical back: Normal range of motion and neck supple.     Right lower leg: No edema.     Left lower leg: No edema.  Skin:    General: Skin is warm and dry.     Findings: No erythema or rash.  Neurological:     Mental Status: He is alert and oriented to person, place,  and time. Mental status is at baseline.     Cranial Nerves: No cranial nerve deficit.  Psychiatric:        Mood and Affect: Mood normal.      LABORATORY DATA:  I have reviewed the data as listed    Latest Ref Rng & Units 06/22/2024    8:21 AM 06/15/2024    8:17 AM 06/08/2024    8:30 AM  CBC  WBC 4.0 - 10.5 K/uL 1.4  1.2  3.8   Hemoglobin 13.0 - 17.0 g/dL 7.9  8.1  9.5   Hematocrit 39.0 - 52.0 % 24.3  24.1  28.6   Platelets 150 - 400 K/uL 125  114  141       Latest Ref Rng & Units 06/22/2024    8:20 AM 06/15/2024    8:17 AM 06/08/2024    8:30 AM  CMP  Glucose 70 - 99 mg/dL 99  870  868   BUN 8 - 23 mg/dL 28  51  32   Creatinine 0.61 - 1.24 mg/dL 8.68  8.26  8.48   Sodium 135 - 145 mmol/L 133  131  133   Potassium 3.5 - 5.1 mmol/L 4.1  4.6  4.8   Chloride 98 - 111 mmol/L 107  103  103   CO2 22 - 32 mmol/L 19  19  21    Calcium  8.9 - 10.3 mg/dL 7.6  8.5  8.4   Total Protein 6.5 - 8.1 g/dL 9.0  8.9  8.8   Total Bilirubin 0.0 - 1.2 mg/dL 1.0  1.3  0.8   Alkaline Phos 38 - 126 U/L 105  135  91   AST 15 - 41 U/L 27  28  29    ALT 0 - 44 U/L 16  31  36    Lab Results  Component Value Date   IRON 70 03/28/2024   TIBC 304 03/28/2024   IRONPCTSAT 23 03/28/2024   FERRITIN 535 (H) 03/28/2024     RADIOGRAPHIC STUDIES: I have personally reviewed the radiological images as listed and agreed with the findings in the report. CT ANGIO HEAD NECK W WO CM Result Date: 06/14/2024 EXAM: CTA Head and Neck with Intravenous Contrast. CT Head without Contrast. CLINICAL HISTORY: Multiple myeloma not having achieved remission (HCC), Syncope and collapse, Carotid stenosis, left. TECHNIQUE: Axial CTA images of the head and neck performed with intravenous contrast. MIP reconstructed images were created and reviewed. Axial computed tomography images of the head/brain performed without intravenous contrast. Note: Per PQRS, the description of internal carotid artery percent stenosis, including 0 percent or  normal exam, is based on [place] American Symptomatic Carotid Endarterectomy Trial (NASCET) criteria. Dose reduction technique was used including one or more of the following: automated exposure control, adjustment of mA and kV according to patient size, and/or iterative reconstruction. CONTRAST: Without and with; 75mL (iohexol  (OMNIPAQUE ) 350 MG/ML injection 75 mL IOHEXOL  350 MG/ML SOLN). COMPARISON: CT head 07/14/2023. FINDINGS: CT HEAD: BRAIN: No acute intraparenchymal hemorrhage. No mass lesion. No CT evidence for acute territorial infarct. No midline shift or extra-axial collection. The intracranial internal carotid arteries are patent bilaterally. Atherosclerosis of the bilateral carotid siphons without significant stenosis. The middle cerebral arteries are patent bilaterally. The anterior cerebral arteries are patent bilaterally. VENTRICLES: No hydrocephalus. ORBITS: The orbits are unremarkable. SINUSES AND MASTOIDS: Mild mucosal thickening in the ethmoid sinuses. The mastoid air cells are clear. CTA NECK: COMMON CAROTID ARTERIES: Mild atherosclerosis of the visualized aortic arch. The right common carotid artery is patent from the origin to the skull base. Atherosclerosis of the distal right common carotid artery resulting in mild stenosis. The left common carotid artery is patent from the origin to the skull base. Atherosclerosis along the distal left common carotid artery resulting in mild stenosis. INTERNAL CAROTID ARTERIES: Right: Atherosclerosis at the right carotid bifurcation and proximal right cervical ICA with resulting 50% stenosis of the proximal right cervical ICA. Mild tortuosity of the distal right cervical ICA. Left: Atherosclerosis at the left carotid bifurcation without hemodynamically significant stenosis. VERTEBRAL ARTERIES: The left vertebral artery is dominant. The vertebral arteries are patent from the origins to the vertebrobasilar confluence. No significant stenosis. No dissection or  occlusion. CTA HEAD: ANTERIOR CEREBRAL ARTERIES: Patent bilaterally. No significant stenosis. No occlusion. No aneurysm. MIDDLE CEREBRAL ARTERIES: Patent bilaterally. No significant stenosis. No occlusion. No aneurysm. POSTERIOR CEREBRAL ARTERIES: No significant stenosis. No occlusion. No aneurysm. BASILAR ARTERY: No significant stenosis. No occlusion. No aneurysm. OTHER: SOFT TISSUES: Subcentimeter nodule within the left thyroid lobe. 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 BONES: Edentulous maxilla and mandible. Degenerative changes throughout the visualized spine. Chronic deformity of the lateral aspect of the left clavicle. No acute osseous abnormality. IMPRESSION: 1. No large vessel occlusion. 2. No acute intracranial abnormality. 3. Atherosclerosis at the right carotid bifurcation resulting in 50% stenosis of the proximal right cervical ICA. 4. Atherosclerosis along the distal left common carotid artery resulting in mild  stenosis and at the left carotid bifurcation without significant stenosis. Electronically signed by: Donnice Mania MD 06/14/2024 04:48 PM EDT RP Workstation: [id number]

## 2024-06-22 NOTE — Progress Notes (Signed)
 Pt here for follow up. No new concerns voiced.

## 2024-06-22 NOTE — Addendum Note (Signed)
 Addended by: BABARA CALL on: 06/22/2024 12:19 PM   Modules accepted: Orders

## 2024-06-22 NOTE — Assessment & Plan Note (Signed)
 Neutropenia precaution.  GCSF today.

## 2024-06-22 NOTE — Addendum Note (Signed)
 Addended by: HEROLD ALMARIE PARAS on: 06/22/2024 09:23 AM   Modules accepted: Orders

## 2024-06-22 NOTE — Assessment & Plan Note (Signed)
 Encourage oral hydration and avoid nephrotoxins.  Improved after holding Lasix 

## 2024-06-22 NOTE — Assessment & Plan Note (Signed)
 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  Lab Results  Component Value Date   HGB 7.9 (L) 06/22/2024   TIBC 304 03/28/2024   IRONPCTSAT 23 03/28/2024   FERRITIN 535 (H) 03/28/2024    Decreased due to chemotherapy. Monitor.

## 2024-06-22 NOTE — Progress Notes (Signed)
 Critical lab received from Luke SAUNDERS at [place] [facility]. ANC 0.4. MD notified.

## 2024-06-22 NOTE — Assessment & Plan Note (Signed)
 Follow up with cardiology Patient is on Lasix  20 mg daily.  Creatinine increased to 1.7. He appears euvolemic Previously discussed with cardiology, recommend patient to hold off Lasix , and only use PRN leg edema.

## 2024-06-24 ENCOUNTER — Other Ambulatory Visit: Payer: Self-pay

## 2024-06-25 ENCOUNTER — Inpatient Hospital Stay

## 2024-06-25 ENCOUNTER — Encounter: Payer: Self-pay | Admitting: Oncology

## 2024-06-25 DIAGNOSIS — C9000 Multiple myeloma not having achieved remission: Secondary | ICD-10-CM

## 2024-06-25 LAB — CBC WITH DIFFERENTIAL (CANCER CENTER ONLY)
Abs Immature Granulocytes: 0.29 K/uL — ABNORMAL HIGH (ref 0.00–0.07)
Basophils Absolute: 0 K/uL (ref 0.0–0.1)
Basophils Relative: 1 %
Eosinophils Absolute: 0.1 K/uL (ref 0.0–0.5)
Eosinophils Relative: 3 %
HCT: 24.2 % — ABNORMAL LOW (ref 39.0–52.0)
Hemoglobin: 7.9 g/dL — ABNORMAL LOW (ref 13.0–17.0)
Immature Granulocytes: 6 %
Lymphocytes Relative: 14 %
Lymphs Abs: 0.7 K/uL (ref 0.7–4.0)
MCH: 34.5 pg — ABNORMAL HIGH (ref 26.0–34.0)
MCHC: 32.6 g/dL (ref 30.0–36.0)
MCV: 105.7 fL — ABNORMAL HIGH (ref 80.0–100.0)
Monocytes Absolute: 0.8 K/uL (ref 0.1–1.0)
Monocytes Relative: 17 %
Neutro Abs: 2.7 K/uL (ref 1.7–7.7)
Neutrophils Relative %: 59 %
Platelet Count: 97 K/uL — ABNORMAL LOW (ref 150–400)
RBC: 2.29 MIL/uL — ABNORMAL LOW (ref 4.22–5.81)
RDW: 17.1 % — ABNORMAL HIGH (ref 11.5–15.5)
WBC Count: 4.5 K/uL (ref 4.0–10.5)
nRBC: 1.8 % — ABNORMAL HIGH (ref 0.0–0.2)

## 2024-06-25 MED ORDER — FILGRASTIM-SNDZ 480 MCG/0.8ML IJ SOSY
480.0000 ug | PREFILLED_SYRINGE | Freq: Every day | INTRAMUSCULAR | Status: DC
  Administered 2024-06-25: 480 ug via SUBCUTANEOUS
  Filled 2024-06-25: qty 0.8

## 2024-06-26 ENCOUNTER — Encounter: Payer: Self-pay | Admitting: Oncology

## 2024-06-26 ENCOUNTER — Telehealth: Payer: Self-pay | Admitting: *Deleted

## 2024-06-26 NOTE — Telephone Encounter (Signed)
 [id number] telephone [phone].  They want to know if he is not getting the Xpovio   this week.  I spoke to Dr. Babara and she says that he will be coming back in on 10/23/2020 and based on the labs he might get it or and if it is low he will not get it but when he does have the ability to have it it is only just going to be 40 mg week.  They will call on 10/20  in the afternoon to see what his numbers are and will he get started if the labs are good

## 2024-06-28 ENCOUNTER — Other Ambulatory Visit: Payer: Self-pay | Admitting: Oncology

## 2024-06-28 DIAGNOSIS — C9000 Multiple myeloma not having achieved remission: Secondary | ICD-10-CM

## 2024-06-29 ENCOUNTER — Encounter: Payer: Self-pay | Admitting: Oncology

## 2024-07-02 NOTE — Progress Notes (Signed)
 Electrophysiology Office Note:   Date:  07/03/2024  ID:  Victor Phillips, DOB 1956/04/10, MRN [id number]  Primary Cardiologist: Redell Cave, MD Electrophysiologist: Fonda Kitty, MD      History of Present Illness:   Victor Phillips is a 68 y.o. male with h/o persistent atrial fibrillation, hypertension, hyperlipidemia, diabetes, multiple myeloma who is being seen today for EP follow up.    Discussed the use of AI scribe software for clinical note transcription with the patient, who gave verbal consent to proceed.  History of Present Illness Victor Phillips is a 68 year old male with atrial fibrillation who presents for follow-up regarding his medication management. He was referred by Suzann Riddle, NP for evaluation of his atrial fibrillation treatment efficacy.  He has a history of atrial fibrillation and was previously unable to take blood thinners due to cost. He is currently on Eliquis  without any issues.  He started on amiodarone  with an initial loading dose of two tablets per day for fourteen days, and is now on a maintenance dose of 200 mg once daily. He converted to sinus rhythm after completing oral load. He reports more energy while in normal rhythm.  His liver and thyroid function tests were checked in August and were normal.    Review of systems complete and found to be negative unless listed in HPI.   EP Information / Studies Reviewed:   EKG is ordered today. Personal review as below.  EKG Interpretation Date/Time:  Tuesday July 03 2024 09:38:33 EDT Ventricular Rate:  68 PR Interval:  176 QRS Duration:  88 QT Interval:  438 QTC Calculation: 465 R Axis:   18  Text Interpretation: Normal sinus rhythm Nonspecific T wave abnormality When compared with ECG of 25-May-2024 09:49, Sinus rhythm has replaced Atrial fibrillation Vent. rate has decreased BY  34 BPM Nonspecific T wave abnormality now evident in Anterolateral leads Confirmed by Kitty Fonda  [phone]) on 07/03/2024 9:47:13 AM   EKG 10/06/23: AF    Cardiac MRI 07/27/23: IMPRESSION: 1.  Normal LV size, low normal systolic function.  LVEF 50% 2.  No LV LGE or scar. 3.  Normal ECV, no evidence for infiltrative disease. 4.  No significant valvular abnormalities. 5.  Normal RV systolic function. 6.  No evidence for cardiac amyloidosis.  Echo 06/14/23:  1. Left ventricular ejection fraction, by estimation, is 50 to 55%. The  left ventricle has low normal function. The left ventricle has no regional  wall motion abnormalities. There is mild left ventricular hypertrophy.  Left ventricular diastolic function   could not be evaluated.   2. Right ventricular systolic function is normal. The right ventricular  size is normal. There is moderately elevated pulmonary artery systolic  pressure.   3. Right atrial size was mildly dilated.   4. The mitral valve is normal in structure. Mild mitral valve  regurgitation.   5. The aortic valve is tricuspid. Aortic valve regurgitation is not  visualized. No aortic stenosis is present.   6. The inferior vena cava is normal in size with <50% respiratory  variability, suggesting right atrial pressure of 8 mmHg.   Risk Assessment/Calculations:    CHA2DS2-VASc Score = 3   This indicates a 3.2% annual risk of stroke. The patient's score is based upon: CHF History: 0 HTN History: 1 Diabetes History: 1 Stroke History: 0 Vascular Disease History: 0 Age Score: 1 Gender Score: 0             Physical Exam:  VS:  BP 124/60 (BP Location: Left Arm, Patient Position: Sitting, Cuff Size: Normal)   Pulse 68   Ht 5' 7 (1.702 m)   Wt 152 lb 3.2 oz (69 kg)   SpO2 93%   BMI 23.84 kg/m    Wt Readings from Last 3 Encounters:  07/03/24 152 lb 3.2 oz (69 kg)  06/22/24 158 lb 3.2 oz (71.8 kg)  06/18/24 153 lb 10.6 oz (69.7 kg)    GEN: Well nourished, well developed in no acute distress NECK: No JVD CARDIAC: Normal rate, regular  rhythm. RESPIRATORY:  Clear to auscultation without rales, wheezing or rhonchi  ABDOMEN: Soft, non-distended EXTREMITIES: Trace edema; No deformity   ASSESSMENT AND PLAN:    #Persistent atrial fibrillation: Converted with amiodarone . Reports more energy in sinus.  Lower extremity edema markedly improved with maintenance of sinus rhythm. #High risk medication use: Amiodarone .  Normal LFTs 06/22/2024.  Normal TSH 05/07/2024. - Currently not felt to be a good candidate for catheter ablation given poor functional status and multiple myeloma refractory to treatment, complicated by neutropenia, anemia.  Continue amiodarone  200 mg once daily.  #Secondary hypercoagulable state due to atrial fibrillation: CHADSVASC score of 3. - Continue Eliquis  5 mg twice daily.  Denies any bleeding issues.  #Hypertension -At goal today.  Recommend checking blood pressures 1-2 times per week at home and recording the values.  Recommend bringing these recordings to the primary care physician.  #Multiple myeloma: -Continue follow up with Oncology.  Follow-up with the EP APP in 6 months.  Signed, Fonda Kitty, MD

## 2024-07-03 ENCOUNTER — Other Ambulatory Visit: Payer: Self-pay

## 2024-07-03 ENCOUNTER — Encounter: Payer: Self-pay | Admitting: Cardiology

## 2024-07-03 ENCOUNTER — Ambulatory Visit: Attending: Cardiology | Admitting: Cardiology

## 2024-07-03 VITALS — BP 124/60 | HR 68 | Ht 67.0 in | Wt 152.2 lb

## 2024-07-03 DIAGNOSIS — D6869 Other thrombophilia: Secondary | ICD-10-CM

## 2024-07-03 DIAGNOSIS — I10 Essential (primary) hypertension: Secondary | ICD-10-CM | POA: Diagnosis not present

## 2024-07-03 DIAGNOSIS — I4819 Other persistent atrial fibrillation: Secondary | ICD-10-CM | POA: Diagnosis not present

## 2024-07-03 DIAGNOSIS — Z79899 Other long term (current) drug therapy: Secondary | ICD-10-CM

## 2024-07-03 DIAGNOSIS — C9000 Multiple myeloma not having achieved remission: Secondary | ICD-10-CM

## 2024-07-03 MED ORDER — AMIODARONE HCL 200 MG PO TABS: 200.0000 mg | ORAL_TABLET | Freq: Every day | ORAL | 2 refills | Status: AC

## 2024-07-03 NOTE — Patient Instructions (Signed)
 Medication Instructions:  Your physician recommends that you continue on your current medications as directed. Please refer to the Current Medication list given to you today.  *If you need a refill on your cardiac medications before your next appointment, please call your pharmacy*  Follow-Up: At [facility], you and your health needs are our priority.  As part of our continuing mission to provide you with exceptional heart care, our providers are all part of one team.  This team includes your primary Cardiologist (physician) and Advanced Practice Providers or APPs (Physician Assistants and Nurse Practitioners) who all work together to provide you with the care you need, when you need it.  Your next appointment:   6 months  Provider:   Suzann Riddle, NP

## 2024-07-04 ENCOUNTER — Inpatient Hospital Stay

## 2024-07-04 ENCOUNTER — Encounter: Payer: Self-pay | Admitting: Oncology

## 2024-07-04 ENCOUNTER — Inpatient Hospital Stay (HOSPITAL_BASED_OUTPATIENT_CLINIC_OR_DEPARTMENT_OTHER): Admitting: Oncology

## 2024-07-04 VITALS — BP 131/73 | HR 91 | Temp 96.7°F | Resp 16 | Wt 150.0 lb

## 2024-07-04 DIAGNOSIS — N1832 Chronic kidney disease, stage 3b: Secondary | ICD-10-CM

## 2024-07-04 DIAGNOSIS — T451X5A Adverse effect of antineoplastic and immunosuppressive drugs, initial encounter: Secondary | ICD-10-CM

## 2024-07-04 DIAGNOSIS — I5031 Acute diastolic (congestive) heart failure: Secondary | ICD-10-CM | POA: Diagnosis not present

## 2024-07-04 DIAGNOSIS — C9000 Multiple myeloma not having achieved remission: Secondary | ICD-10-CM | POA: Diagnosis not present

## 2024-07-04 DIAGNOSIS — Z5111 Encounter for antineoplastic chemotherapy: Secondary | ICD-10-CM

## 2024-07-04 DIAGNOSIS — D6481 Anemia due to antineoplastic chemotherapy: Secondary | ICD-10-CM

## 2024-07-04 LAB — CBC WITH DIFFERENTIAL (CANCER CENTER ONLY)
Abs Immature Granulocytes: 0.3 K/uL — ABNORMAL HIGH (ref 0.00–0.07)
Basophils Absolute: 0.1 K/uL (ref 0.0–0.1)
Basophils Relative: 1 %
Eosinophils Absolute: 0.3 K/uL (ref 0.0–0.5)
Eosinophils Relative: 5 %
HCT: 24.6 % — ABNORMAL LOW (ref 39.0–52.0)
Hemoglobin: 8 g/dL — ABNORMAL LOW (ref 13.0–17.0)
Immature Granulocytes: 5 %
Lymphocytes Relative: 12 %
Lymphs Abs: 0.7 K/uL (ref 0.7–4.0)
MCH: 35.1 pg — ABNORMAL HIGH (ref 26.0–34.0)
MCHC: 32.5 g/dL (ref 30.0–36.0)
MCV: 107.9 fL — ABNORMAL HIGH (ref 80.0–100.0)
Monocytes Absolute: 0.4 K/uL (ref 0.1–1.0)
Monocytes Relative: 7 %
Neutro Abs: 4.1 K/uL (ref 1.7–7.7)
Neutrophils Relative %: 70 %
Platelet Count: 146 K/uL — ABNORMAL LOW (ref 150–400)
RBC: 2.28 MIL/uL — ABNORMAL LOW (ref 4.22–5.81)
RDW: 17.2 % — ABNORMAL HIGH (ref 11.5–15.5)
WBC Count: 6 K/uL (ref 4.0–10.5)
nRBC: 0.3 % — ABNORMAL HIGH (ref 0.0–0.2)

## 2024-07-04 LAB — CMP (CANCER CENTER ONLY)
ALT: 17 U/L (ref 0–44)
AST: 29 U/L (ref 15–41)
Albumin: 3.3 g/dL — ABNORMAL LOW (ref 3.5–5.0)
Alkaline Phosphatase: 114 U/L (ref 38–126)
Anion gap: 11 (ref 5–15)
BUN: 37 mg/dL — ABNORMAL HIGH (ref 8–23)
CO2: 21 mmol/L — ABNORMAL LOW (ref 22–32)
Calcium: 9.6 mg/dL (ref 8.9–10.3)
Chloride: 104 mmol/L (ref 98–111)
Creatinine: 1.8 mg/dL — ABNORMAL HIGH (ref 0.61–1.24)
GFR, Estimated: 41 mL/min — ABNORMAL LOW (ref >60)
Glucose, Bld: 117 mg/dL — ABNORMAL HIGH (ref 70–99)
Potassium: 4.7 mmol/L (ref 3.5–5.1)
Sodium: 136 mmol/L (ref 135–145)
Total Bilirubin: 0.6 mg/dL (ref 0.0–1.2)
Total Protein: 9.4 g/dL — ABNORMAL HIGH (ref 6.5–8.1)

## 2024-07-04 MED ORDER — DEXAMETHASONE 4 MG PO TABS: 20.0000 mg | ORAL_TABLET | ORAL | 5 refills | Status: DC

## 2024-07-04 NOTE — Progress Notes (Signed)
 Hematology/Oncology Progress note [phone] [phone]         Patient Care Team: Cleotilde Oneil FALCON, MD as PCP - General (Internal Medicine) Darliss Rogue, MD as PCP - Cardiology (Cardiology) Kennyth Chew, MD as PCP - Electrophysiology (Clinical Cardiac Electrophysiology) Babara Call, MD as Consulting Physician (Oncology) Riddle, Suzann, NP as Nurse Practitioner (Clinical Cardiac Electrophysiology) Franchester Mikey DEL, PA-C as Physician Assistant (Cardiology)  CHIEF COMPLAINTS/REASON FOR VISIT:  Multiple myeloma   ASSESSMENT & PLAN:   Cancer Staging  Multiple myeloma [facility]) Staging form: Plasma Cell Myeloma and Plasma Cell Disorders, AJCC 8th Edition - Clinical stage from 04/14/2023: RISS Stage II (Beta-2 -microglobulin (mg/L): 8.4, Albumin (g/dL): 2.6, ISS: Stage III, High-risk cytogenetics: Absent, LDH: Normal) - Signed by Babara Call, MD on 04/22/2023   Multiple myeloma (HCC) IgA lamda multiple myeloma, 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 Neutropenia resolved.  Recommend patient to resume treatment with decrease Selinexor  to 40mg , with Pomalyst  2 mg day 1-21, and dexamethasone  20 mg weekly  Weekly labs and close monitor   Continue Acyclovir , At risk of thrombosis, on Eliquis  5mg  BID for A fib Xegeva monthly if calcium  meets criteria - held since CHF, plan to resume in the future.  Recommend calcium  and vitamin D  supplementation.    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  Lab Results  Component Value Date   HGB 8.0 (L) 07/04/2024   TIBC 304 03/28/2024   IRONPCTSAT 23 03/28/2024   FERRITIN 535 (H) 03/28/2024    Decreased due to chemotherapy. Monitor.    CKD (chronic kidney disease) stage 3, GFR 30-59 ml/min (HCC) Encourage oral hydration and avoid nephrotoxins.  Fluctuate Cr levels.    Diastolic CHF, acute (HCC) Follow up with cardiology Patient is on Lasix  20 mg daily. PRN leg edema  Encounter for antineoplastic chemotherapy Chemotherapy plan as listed above.   Orders Placed This Encounter  Procedures   CMP (Cancer Center only)    Standing Status:   Future    Expected Date:   07/18/2024    Expiration Date:   10/16/2024   CBC with Differential (Cancer Center Only)    Standing Status:   Future    Expected Date:   07/18/2024    Expiration Date:   10/16/2024   CBC with Differential (Cancer Center Only)    Standing Status:   Future    Expected Date:   07/11/2024    Expiration Date:   10/09/2024   CMP (Cancer Center only)    Standing Status:   Future    Expected Date:   07/11/2024    Expiration Date:   10/09/2024   Follow-up per LOS  We spent sufficient time to discuss many aspect of care, questions were answered to patient's satisfaction.    Call Babara, MD, PhD [facility] Hematology Oncology 07/04/2024     HISTORY OF PRESENTING ILLNESS:  Victor Phillips is a  68 y.o.  male with PMH listed below who was referred to me for anemia  02/11/2023 - 02/14/2023 recent hospitalization due to pneumonia, respiratory failure.  He was found to have a hemoglobin decreased at 6.8, status post PRBC transfusion during admission.  EGD showed gastritis.  Colonoscopy  was not remarkable. 02/11/2023 TIBC 221 ferritin 107, iron saturation 16. Patient is currently taking fusion plus Vitamin B12 level in the 300s. His echo showed grade 2 diastolic CHF.  He denies recent chest pain on exertion, shortness of breath on minimal exertion, pre-syncopal episodes, or palpitations He had not noticed any recent bleeding such as epistaxis, hematuria or hematochezia.  He denies over the counter NSAID ingestion.  Oncology History  Multiple myeloma (HCC)   04/14/2023 Initial Diagnosis   Multiple myeloma   03/13/2020 multiple myeloma panel showed M protein 4.3, IgA lambda Free lamda Level 142,-light chain ratio 0.07 04/05/2023 bone marrow biopsy showed hypercellular bone marrow with plasma cell neoplasm.The bone marrow is hypercellular for age with prominent increase in plasma cells representing 65% of all cells in the aspirate associated with interstitial infiltrates and numerous variably sized aggregates in  the clot and biopsy sections.  The plasma cells display lambda light chain restriction consistent with plasma cell neoplasm.  Normal cytogenetics.  FISH studies pending.    04/14/2023 Cancer Staging   Staging form: Plasma Cell Myeloma and Plasma Cell Disorders, AJCC 8th Edition - Clinical stage from 04/14/2023: RISS Stage II (Beta-2 -microglobulin (mg/L): 8.4, Albumin (g/dL): 2.6, ISS: Stage III, High-risk cytogenetics: Absent, LDH: Normal) - Signed by Babara Call, MD on 04/22/2023 Stage prefix: Initial diagnosis Beta 2 microglobulin range (mg/L): Greater than or equal to 5.5 Albumin range (g/dL): Less than 3.5 Cytogenetics: 1q addition, Other mutation   04/14/2023 Imaging   Skeletal survey 1. No lytic lesions or other intrinsic bony abnormality. 2. Borderline cardiomegaly with an interval decrease in size. 3. Diffuse atheromatous arterial calcifications including bilateral carotid artery calcifications, right greater than left   04/22/2023 - 11/11/2023 Chemotherapy   Patient is on Treatment Plan : MYELOMA NEWLY DIAGNOSED TRANSPLANT CANDIDATE DaraVRd (Daratumumab  SQ) q21d     04/25/2023 Imaging   PET scan showed  1. Two tiny hypermetabolic lucent lesions in the left posteromedial eighth and ninth ribs. No additional evidence of multiple myeloma. 2. Aortic atherosclerosis (ICD10-I70.0). Coronary artery calcification.   07/14/2023 - 07/19/2023 Hospital Admission   Hospitalized due to ileus.    07/25/2023 Bone Marrow Biopsy   A-C. Peripheral blood  and bone marrow, (peripheral smear, aspirate smear, touch preparation, clot section, core biopsy):   Persistent plasma cell myeloma, 50-60% lambda restricted plasma cells on core biopsy. Hypercellular bone marrow (70%) with trilineage hematopoiesis. Negative for increased blasts. Negative for significant dysplasia. Negative for amyloid deposition.   01/25/2024 -  Chemotherapy   Patient is on Treatment Plan : MYELOMA RELAPSED/ REFRACTORY Carfilzomib  D1,8,15 (20/27) + selinexor   + Dexamethasone  (KPd) q28d       A Fib. on Eliquis  5mg  BID, follows up with cardiology.  Patient reports being compliant on Eliquis .  Patient is off Lasix  20 mg for most days, only uses PRN leg edema.  Denies fever, chills, diarrhea, chest pain, nausea vomiting.   MEDICAL HISTORY:  Past Medical History:  Diagnosis Date   Atrial fibrillation (HCC)    Diabetes mellitus without complication (HCC)    Hypercholesteremia    Hypertension    Multiple myeloma not having achieved remission (HCC)     SURGICAL HISTORY: Past Surgical History:  Procedure Laterality Date   CARDIOVERSION N/A 10/17/2023   Procedure: CARDIOVERSION;  Surgeon: Darliss Rogue, MD;  Location: [facility] ORS;  Service: Cardiovascular;  Laterality: N/A;   COLONOSCOPY N/A 02/13/2023   Procedure: COLONOSCOPY;  Surgeon: Toledo, Ladell POUR, MD;  Location: [facility] ENDOSCOPY;  Service: Gastroenterology;  Laterality:  N/A;   COLONOSCOPY N/A 02/14/2023   Procedure: COLONOSCOPY;  Surgeon: Toledo, Ladell POUR, MD;  Location: [facility] ENDOSCOPY;  Service: Gastroenterology;  Laterality: N/A;   ESOPHAGOGASTRODUODENOSCOPY N/A 02/13/2023   Procedure: ESOPHAGOGASTRODUODENOSCOPY (EGD);  Surgeon: Toledo, Ladell POUR, MD;  Location: [facility] ENDOSCOPY;  Service: Gastroenterology;  Laterality: N/A;   IR BONE MARROW BIOPSY & ASPIRATION  11/24/2023    SOCIAL HISTORY: Social History   Socioeconomic History   Marital status: Married    Spouse name: Not on file   Number of children: Not  on file   Years of education: Not on file   Highest education level: Not on file  Occupational History   Not on file  Tobacco Use   Smoking status: Never   Smokeless tobacco: Never  Vaping Use   Vaping status: Never Used  Substance and Sexual Activity   Alcohol use: Not Currently    Comment: quit approx 2001   Drug use: No   Sexual activity: Not on file  Other Topics Concern   Not on file  Social History Narrative   Not on file   Social Drivers of Health   Financial Resource Strain: Medium Risk (01/13/2024)   Overall Financial Resource Strain (CARDIA)    Difficulty of Paying Living Expenses: Somewhat hard  Food Insecurity: Food Insecurity Present (01/13/2024)   Hunger Vital Sign    Worried About Running Out of Food in the Last Year: Sometimes true    Ran Out of Food in the Last Year: Sometimes true  Transportation Needs: No Transportation Needs (01/13/2024)   PRAPARE - Administrator, Civil Service (Medical): No    Lack of Transportation (Non-Medical): No  Physical Activity: Insufficiently Active (10/12/2023)   Received from [facility]   Exercise Vital Sign    On average, how many days per week do you engage in moderate to strenuous exercise (like a brisk walk)?: 7 days    On average, how many minutes do you engage in exercise at this level?: 10 min  Stress: No Stress Concern Present (10/12/2023)   Received from [facility] - Occupational Stress Questionnaire    Feeling of Stress : Not at all  Recent Concern: Stress - Stress Concern Present (10/04/2023)   Received from [facility] - Occupational Stress Questionnaire    Feeling of Stress : Rather much  Social Connections: Moderately Integrated (10/12/2023)   Received from [facility]   Social Connection and Isolation Panel    In a typical week, how many times do  you talk on the phone with family, friends, or neighbors?: More than three times a week    How often do you get together with friends or relatives?: More than three times a week    How often do you attend church or religious services?: 1 to 4 times per year    Do you belong to any clubs or organizations such as church groups, unions, fraternal or athletic groups, or school groups?: No    How often do you attend meetings of the clubs or organizations you belong to?: Never    Are you married, widowed, divorced, separated, never married, or living with a partner?: Married  Intimate Partner Violence: Not At Risk (07/14/2023)   Humiliation, Afraid, Rape, and Kick questionnaire    Fear of Current or Ex-Partner: No    Emotionally Abused: No  Physically Abused: No    Sexually Abused: No    FAMILY HISTORY: Family History  Problem Relation Age of Onset   Diabetes Mother    Heart attack Father     ALLERGIES:  has no known allergies.  MEDICATIONS:  Current Outpatient Medications  Medication Sig Dispense Refill   acetaminophen  (TYLENOL ) 325 MG tablet Take 2 tablets (650 mg total) by mouth every 6 (six) hours as needed for mild pain (or Fever >/= 101). 90 tablet 1   acyclovir  (ZOVIRAX ) 400 MG tablet Take 1 tablet (400 mg total) by mouth 2 (two) times daily. 180 tablet 0   amiodarone  (PACERONE ) 200 MG tablet Take 1 tablet (200 mg total) by mouth daily. 90 tablet 2   apixaban  (ELIQUIS ) 5 MG TABS tablet Take 1 tablet (5 mg total) by mouth 2 (two) times daily. 60 tablet 5   atorvastatin  (LIPITOR) 10 MG tablet Take 10 mg by mouth daily.     calcium  carbonate (OS-CAL) 600 MG TABS tablet Take 2 tablets (1,200 mg total) by mouth daily.     cholecalciferol (VITAMIN D3) 25 MCG (1000 UNIT) tablet Take 1 tablet (1,000 Units total) by mouth daily.     cyanocobalamin  (VITAMIN B12) 1000 MCG tablet Take 1 tablet (1,000 mcg total) by mouth daily.     empagliflozin  (JARDIANCE ) 10 MG TABS tablet Take 1 tablet (10  mg total) by mouth daily before breakfast. 30 tablet 2   loperamide  (IMODIUM ) 2 MG capsule Take 1 capsule (2 mg total) by mouth See admin instructions. With onset of diarrhea, take 4 mg,then 2 mg every 2 hours or after every loose bowel movements  maximum: 16 mg/day 60 capsule 2   metoprolol  succinate (TOPROL -XL) 25 MG 24 hr tablet Take 0.5 tablets (12.5 mg total) by mouth daily. 45 tablet 1   Multiple Vitamin (MULTIVITAMIN WITH MINERALS) TABS tablet Take 1 tablet by mouth daily. 90 tablet 1   ondansetron  (ZOFRAN ) 8 MG tablet Take 1 tablet (8 mg total) by mouth every 8 (eight) hours as needed for nausea or vomiting. 90 tablet 1   pantoprazole  (PROTONIX ) 40 MG tablet Take 1 tablet (40 mg total) by mouth daily. 30 tablet 1   pomalidomide  (POMALYST ) 2 MG capsule Take 1 capsule (2 mg total) by mouth daily. Take for 21 days, then hold for 7 days. Repeat every 28 days. 21 capsule 0   prochlorperazine  (COMPAZINE ) 10 MG tablet Take 1 tablet (10 mg total) by mouth every 6 (six) hours as needed for nausea or vomiting. 90 tablet 1   Selinexor , 40 MG Once Weekly, (XPOVIO , 40 MG ONCE WEEKLY,) 10 MG TBPK Take 40 mg by mouth once a week. 16 each 1   spironolactone  (ALDACTONE ) 25 MG tablet Take 0.5 tablets (12.5 mg total) by mouth daily. 45 tablet 1   temazepam (RESTORIL) 15 MG capsule Take 15 mg by mouth at bedtime.     dexamethasone  (DECADRON ) 4 MG tablet Take 5 tablets (20 mg total) by mouth once a week. 20 tablet 5   furosemide  (LASIX ) 20 MG tablet Take 1 tablet (20 mg total) by mouth daily. (Patient not taking: Reported on 07/04/2024)     glipiZIDE (GLUCOTROL XL) 2.5 MG 24 hr tablet Take 5 mg by mouth daily with breakfast. (Patient not taking: Reported on 07/04/2024)     No current facility-administered medications for this visit.    Review of Systems  Constitutional:  Positive for fatigue. Negative for appetite change, chills, fever and unexpected weight change.  HENT:   Negative for hearing loss and voice  change.   Eyes:  Negative for eye problems and icterus.  Respiratory:  Negative for chest tightness, cough and shortness of breath.   Cardiovascular:  Negative for chest pain and leg swelling.  Gastrointestinal:  Negative for abdominal distention, abdominal pain and diarrhea.  Endocrine: Negative for hot flashes.  Genitourinary:  Negative for difficulty urinating, dysuria and frequency.   Musculoskeletal:  Negative for arthralgias.  Skin:  Negative for itching and rash.  Neurological:  Negative for light-headedness and numbness.  Hematological:  Negative for adenopathy. Does not bruise/bleed easily.  Psychiatric/Behavioral:  Positive for sleep disturbance. Negative for confusion.     PHYSICAL EXAMINATION: Vitals:   07/04/24 0920  BP: 131/73  Pulse: 91  Resp: 16  Temp: (!) 96.7 F (35.9 C)  SpO2: 100%   Filed Weights   07/04/24 0920  Weight: 150 lb (68 kg)    Physical Exam Constitutional:      General: He is not in acute distress.    Appearance: He is obese.  HENT:     Head: Normocephalic and atraumatic.     Mouth/Throat:     Comments: Dry oral mucosa  Eyes:     General: No scleral icterus. Cardiovascular:     Rate and Rhythm: Normal rate.  Pulmonary:     Effort: Pulmonary effort is normal. No respiratory distress.     Breath sounds: No wheezing.  Abdominal:     General: Bowel sounds are normal. There is no distension.     Palpations: Abdomen is soft.  Musculoskeletal:        General: Normal range of motion.     Cervical back: Normal range of motion and neck supple.     Right lower leg: No edema.     Left lower leg: No edema.  Skin:    General: Skin is warm and dry.     Findings: No erythema or rash.  Neurological:     Mental Status: He is alert and oriented to person, place, and time. Mental status is at baseline.     Cranial Nerves: No cranial nerve deficit.  Psychiatric:        Mood and Affect: Mood normal.      LABORATORY DATA:  I have reviewed the  data as listed    Latest Ref Rng & Units 07/04/2024    8:45 AM 06/25/2024   10:43 AM 06/22/2024    8:21 AM  CBC  WBC 4.0 - 10.5 K/uL 6.0  4.5  1.4   Hemoglobin 13.0 - 17.0 g/dL 8.0  7.9  7.9   Hematocrit 39.0 - 52.0 % 24.6  24.2  24.3   Platelets 150 - 400 K/uL 146  97  125       Latest Ref Rng & Units 07/04/2024    8:45 AM 06/22/2024    8:20 AM 06/15/2024    8:17 AM  CMP  Glucose 70 - 99 mg/dL 882  99  870   BUN 8 - 23 mg/dL 37  28  51   Creatinine 0.61 - 1.24 mg/dL 8.19  8.68  8.26   Sodium 135 - 145 mmol/L 136  133  131   Potassium 3.5 - 5.1 mmol/L 4.7  4.1  4.6   Chloride 98 - 111 mmol/L 104  107  103   CO2 22 - 32 mmol/L 21  19  19    Calcium  8.9 - 10.3 mg/dL 9.6  7.6  8.5  Total Protein 6.5 - 8.1 g/dL 9.4  9.0  8.9   Total Bilirubin 0.0 - 1.2 mg/dL 0.6  1.0  1.3   Alkaline Phos 38 - 126 U/L 114  105  135   AST 15 - 41 U/L 29  27  28    ALT 0 - 44 U/L 17  16  31     Lab Results  Component Value Date   IRON 70 03/28/2024   TIBC 304 03/28/2024   IRONPCTSAT 23 03/28/2024   FERRITIN 535 (H) 03/28/2024     RADIOGRAPHIC STUDIES: I have personally reviewed the radiological images as listed and agreed with the findings in the report. CT ANGIO HEAD NECK W WO CM Result Date: 06/14/2024 EXAM: CTA Head and Neck with Intravenous Contrast. CT Head without Contrast. CLINICAL HISTORY: Multiple myeloma not having achieved remission (HCC), Syncope and collapse, Carotid stenosis, left. TECHNIQUE: Axial CTA images of the head and neck performed with intravenous contrast. MIP reconstructed images were created and reviewed. Axial computed tomography images of the head/brain performed without intravenous contrast. Note: Per PQRS, the description of internal carotid artery percent stenosis, including 0 percent or normal exam, is based on [place] American Symptomatic Carotid Endarterectomy Trial (NASCET) criteria. Dose reduction technique was used including one or more of the following: automated  exposure control, adjustment of mA and kV according to patient size, and/or iterative reconstruction. CONTRAST: Without and with; 75mL (iohexol  (OMNIPAQUE ) 350 MG/ML injection 75 mL IOHEXOL  350 MG/ML SOLN). COMPARISON: CT head 07/14/2023. FINDINGS: CT HEAD: BRAIN: No acute intraparenchymal hemorrhage. No mass lesion. No CT evidence for acute territorial infarct. No midline shift or extra-axial collection. The intracranial internal carotid arteries are patent bilaterally. Atherosclerosis of the bilateral carotid siphons without significant stenosis. The middle cerebral arteries are patent bilaterally. The anterior cerebral arteries are patent bilaterally. VENTRICLES: No hydrocephalus. ORBITS: The orbits are unremarkable. SINUSES AND MASTOIDS: Mild mucosal thickening in the ethmoid sinuses. The mastoid air cells are clear. CTA NECK: COMMON CAROTID ARTERIES: Mild atherosclerosis of the visualized aortic arch. The right common carotid artery is patent from the origin to the skull base. Atherosclerosis of the distal right common carotid artery resulting in mild stenosis. The left common carotid artery is patent from the origin to the skull base. Atherosclerosis along the distal left common carotid artery resulting in mild stenosis. INTERNAL CAROTID ARTERIES: Right: Atherosclerosis at the right carotid bifurcation and proximal right cervical ICA with resulting 50% stenosis of the proximal right cervical ICA. Mild tortuosity of the distal right cervical ICA. Left: Atherosclerosis at the left carotid bifurcation without hemodynamically significant stenosis. VERTEBRAL ARTERIES: The left vertebral artery is dominant. The vertebral arteries are patent from the origins to the vertebrobasilar confluence. No significant stenosis. No dissection or occlusion. CTA HEAD: ANTERIOR CEREBRAL ARTERIES: Patent bilaterally. No significant stenosis. No occlusion. No aneurysm. MIDDLE CEREBRAL ARTERIES: Patent bilaterally. No significant  stenosis. No occlusion. No aneurysm. POSTERIOR CEREBRAL ARTERIES: No significant stenosis. No occlusion. No aneurysm. BASILAR ARTERY: No significant stenosis. No occlusion. No aneurysm. OTHER: SOFT TISSUES: Subcentimeter nodule within the left thyroid lobe. 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 BONES: Edentulous maxilla and mandible. Degenerative changes throughout the visualized spine. Chronic deformity of the lateral aspect of the left clavicle. No acute osseous abnormality. IMPRESSION: 1. No large vessel occlusion. 2. No acute intracranial abnormality. 3. Atherosclerosis at the right carotid bifurcation resulting in 50% stenosis of the proximal right cervical ICA. 4. Atherosclerosis  along the distal left common carotid artery resulting in mild stenosis and at the left carotid bifurcation without significant stenosis. Electronically signed by: Donnice Mania MD 06/14/2024 04:48 PM EDT RP Workstation: [id number]

## 2024-07-04 NOTE — Assessment & Plan Note (Addendum)
 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 Neutropenia resolved.  Recommend patient to resume treatment with decrease Selinexor  to 40mg , with Pomalyst  2 mg day 1-21, and dexamethasone  20 mg weekly  Weekly labs and close monitor   Continue Acyclovir , At risk of thrombosis, on Eliquis  5mg  BID for A fib Xegeva monthly if calcium  meets criteria - held since CHF, plan to resume in the future.  Recommend calcium  and vitamin D  supplementation.

## 2024-07-04 NOTE — Assessment & Plan Note (Signed)
 Follow up with cardiology Patient is on Lasix  20 mg daily. PRN leg edema

## 2024-07-04 NOTE — Assessment & Plan Note (Signed)
 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  Lab Results  Component Value Date   HGB 8.0 (L) 07/04/2024   TIBC 304 03/28/2024   IRONPCTSAT 23 03/28/2024   FERRITIN 535 (H) 03/28/2024    Decreased due to chemotherapy. Monitor.

## 2024-07-04 NOTE — Assessment & Plan Note (Signed)
 Encourage oral hydration and avoid nephrotoxins.  Fluctuate Cr levels.

## 2024-07-04 NOTE — Assessment & Plan Note (Signed)
 Chemotherapy plan as listed above

## 2024-07-05 ENCOUNTER — Other Ambulatory Visit: Payer: Self-pay

## 2024-07-05 ENCOUNTER — Telehealth: Payer: Self-pay | Admitting: *Deleted

## 2024-07-05 DIAGNOSIS — C9000 Multiple myeloma not having achieved remission: Secondary | ICD-10-CM

## 2024-07-05 LAB — KAPPA/LAMBDA LIGHT CHAINS
Kappa free light chain: 4.8 mg/L (ref 3.3–19.4)
Kappa, lambda light chain ratio: 0 — ABNORMAL LOW (ref 0.26–1.65)
Lambda free light chains: 984.1 mg/L — ABNORMAL HIGH (ref 5.7–26.3)

## 2024-07-05 MED ORDER — XPOVIO (40 MG ONCE WEEKLY) 10 MG PO TBPK: 40.0000 mg | ORAL_TABLET | ORAL | 1 refills | Status: DC

## 2024-07-05 NOTE — Telephone Encounter (Signed)
 Dr. Babara says that he can start back on the Xpovio  and be on 40 mg once a week.  I spoke to [facility] the pharmacist and they will get that started.

## 2024-07-06 ENCOUNTER — Other Ambulatory Visit: Payer: Self-pay

## 2024-07-09 LAB — MULTIPLE MYELOMA PANEL, SERUM
Albumin SerPl Elph-Mcnc: 3.2 g/dL (ref 2.9–4.4)
Albumin/Glob SerPl: 0.6 — ABNORMAL LOW (ref 0.7–1.7)
Alpha 1: 0.3 g/dL (ref 0.0–0.4)
Alpha2 Glob SerPl Elph-Mcnc: 0.7 g/dL (ref 0.4–1.0)
B-Globulin SerPl Elph-Mcnc: 3.8 g/dL — ABNORMAL HIGH (ref 0.7–1.3)
Gamma Glob SerPl Elph-Mcnc: 1.2 g/dL (ref 0.4–1.8)
Globulin, Total: 6 g/dL — ABNORMAL HIGH (ref 2.2–3.9)
IgA: 4144 mg/dL — ABNORMAL HIGH (ref 61–437)
IgG (Immunoglobin G), Serum: 800 mg/dL (ref 603–1613)
IgM (Immunoglobulin M), Srm: 6 mg/dL — ABNORMAL LOW (ref 20–172)
M Protein SerPl Elph-Mcnc: 2.2 g/dL — ABNORMAL HIGH
Total Protein ELP: 9.2 g/dL — ABNORMAL HIGH (ref 6.0–8.5)

## 2024-07-11 ENCOUNTER — Encounter: Payer: Self-pay | Admitting: Nurse Practitioner

## 2024-07-11 ENCOUNTER — Inpatient Hospital Stay (HOSPITAL_BASED_OUTPATIENT_CLINIC_OR_DEPARTMENT_OTHER): Admitting: Nurse Practitioner

## 2024-07-11 ENCOUNTER — Inpatient Hospital Stay

## 2024-07-11 VITALS — BP 128/57 | HR 65 | Temp 97.7°F | Resp 16 | Ht 67.0 in | Wt 153.3 lb

## 2024-07-11 DIAGNOSIS — C9000 Multiple myeloma not having achieved remission: Secondary | ICD-10-CM | POA: Diagnosis not present

## 2024-07-11 DIAGNOSIS — T451X5A Adverse effect of antineoplastic and immunosuppressive drugs, initial encounter: Secondary | ICD-10-CM

## 2024-07-11 DIAGNOSIS — Z79899 Other long term (current) drug therapy: Secondary | ICD-10-CM | POA: Diagnosis not present

## 2024-07-11 DIAGNOSIS — D6481 Anemia due to antineoplastic chemotherapy: Secondary | ICD-10-CM

## 2024-07-11 LAB — CBC WITH DIFFERENTIAL (CANCER CENTER ONLY)
Abs Immature Granulocytes: 0.15 K/uL — ABNORMAL HIGH (ref 0.00–0.07)
Basophils Absolute: 0 K/uL (ref 0.0–0.1)
Basophils Relative: 0 %
Eosinophils Absolute: 0.2 K/uL (ref 0.0–0.5)
Eosinophils Relative: 3 %
HCT: 24.7 % — ABNORMAL LOW (ref 39.0–52.0)
Hemoglobin: 8.1 g/dL — ABNORMAL LOW (ref 13.0–17.0)
Immature Granulocytes: 3 %
Lymphocytes Relative: 9 %
Lymphs Abs: 0.6 K/uL — ABNORMAL LOW (ref 0.7–4.0)
MCH: 35.4 pg — ABNORMAL HIGH (ref 26.0–34.0)
MCHC: 32.8 g/dL (ref 30.0–36.0)
MCV: 107.9 fL — ABNORMAL HIGH (ref 80.0–100.0)
Monocytes Absolute: 0.5 K/uL (ref 0.1–1.0)
Monocytes Relative: 9 %
Neutro Abs: 4.5 K/uL (ref 1.7–7.7)
Neutrophils Relative %: 76 %
Platelet Count: 181 K/uL (ref 150–400)
RBC: 2.29 MIL/uL — ABNORMAL LOW (ref 4.22–5.81)
RDW: 16.8 % — ABNORMAL HIGH (ref 11.5–15.5)
WBC Count: 6 K/uL (ref 4.0–10.5)
nRBC: 0 % (ref 0.0–0.2)

## 2024-07-11 LAB — CMP (CANCER CENTER ONLY)
ALT: 18 U/L (ref 0–44)
AST: 22 U/L (ref 15–41)
Albumin: 3.2 g/dL — ABNORMAL LOW (ref 3.5–5.0)
Alkaline Phosphatase: 114 U/L (ref 38–126)
Anion gap: 7 (ref 5–15)
BUN: 35 mg/dL — ABNORMAL HIGH (ref 8–23)
CO2: 21 mmol/L — ABNORMAL LOW (ref 22–32)
Calcium: 8.5 mg/dL — ABNORMAL LOW (ref 8.9–10.3)
Chloride: 105 mmol/L (ref 98–111)
Creatinine: 1.32 mg/dL — ABNORMAL HIGH (ref 0.61–1.24)
GFR, Estimated: 59 mL/min — ABNORMAL LOW (ref >60)
Glucose, Bld: 172 mg/dL — ABNORMAL HIGH (ref 70–99)
Potassium: 4.4 mmol/L (ref 3.5–5.1)
Sodium: 133 mmol/L — ABNORMAL LOW (ref 135–145)
Total Bilirubin: 0.7 mg/dL (ref 0.0–1.2)
Total Protein: 9.3 g/dL — ABNORMAL HIGH (ref 6.5–8.1)

## 2024-07-11 NOTE — Progress Notes (Signed)
 Hematology/Oncology Progress note [phone] [phone]         Patient Care Team: Cleotilde Oneil FALCON, MD as PCP - General (Internal Medicine) Darliss Rogue, MD as PCP - Cardiology (Cardiology) Kennyth Chew, MD as PCP - Electrophysiology (Clinical Cardiac Electrophysiology) Babara Call, MD as Consulting Physician (Oncology) Riddle, Suzann, NP as Nurse Practitioner (Clinical Cardiac Electrophysiology) Franchester Mikey DEL, PA-C as Physician Assistant (Cardiology)  CHIEF COMPLAINTS/REASON FOR VISIT:  Multiple myeloma   ASSESSMENT & PLAN:   Cancer Staging  Multiple myeloma [facility]) Staging form: Plasma Cell Myeloma and Plasma Cell Disorders, AJCC 8th Edition - Clinical stage from 04/14/2023: RISS Stage II (Beta-2 -microglobulin (mg/L): 8.4, Albumin (g/dL): 2.6, ISS: Stage III, High-risk cytogenetics: Absent, LDH: Normal) - Signed by Babara Call, MD on 04/22/2023     Multiple myeloma (HCC) IgA lamda multiple myeloma, 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 Neutropenia resolved. ANC 4.5 today Recommend patient to continue treatment with decreased Selinexor  to 40mg , with Pomalyst  2 mg day 1-21, and dexamethasone  20 mg weekly  Weekly labs and close monitor patient in agreement with plan of care   Continue Acyclovir , At risk of thrombosis, on Eliquis  5mg  BID for A fib Xegeva monthly if calcium  meets criteria - held since CHF, plan to resume in the future.  Recommend calcium  and vitamin D  supplementation.      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  No need for transfusion today Hg stable at 8.1    Orders Placed This Encounter  Procedures   CBC with Differential (Cancer  Center Only)    Standing Status:   Future    Expected Date:   07/18/2024    Expiration Date:   10/16/2024   Basic Metabolic Panel - Cancer Center Only    Standing Status:   Future    Expected Date:   07/18/2024    Expiration Date:   10/16/2024   Follow-up per LOS  We spent sufficient time to discuss many aspect of care, questions were answered to patient's satisfaction.    Call Babara, MD, PhD [facility] Hematology Oncology 07/11/2024     HISTORY OF PRESENTING ILLNESS:  Victor Phillips is a  68 y.o.  male with PMH listed below who was referred to me for anemia  02/11/2023 - 02/14/2023 recent hospitalization due to pneumonia, respiratory failure.  He was found to have a hemoglobin decreased at 6.8, status post PRBC transfusion during admission.  EGD showed gastritis.  Colonoscopy was not remarkable. 02/11/2023 TIBC 221 ferritin 107, iron saturation 16. Patient is currently taking fusion plus Vitamin B12 level in the 300s. His echo showed grade 2 diastolic CHF.  He denies recent chest pain on exertion, shortness of breath on minimal exertion, pre-syncopal episodes, or palpitations He had not noticed any recent bleeding such as epistaxis, hematuria or hematochezia.  He denies over the counter NSAID ingestion.  Oncology History  Multiple myeloma (HCC)  04/14/2023 Initial Diagnosis   Multiple myeloma   03/13/2020 multiple myeloma panel showed M protein 4.3, IgA lambda Free lamda Level 142,-light chain ratio 0.07 04/05/2023 bone marrow biopsy showed hypercellular bone marrow with plasma cell neoplasm.The bone marrow is hypercellular for age with prominent  increase in plasma cells representing 65% of all cells in the aspirate associated with interstitial infiltrates and numerous variably sized aggregates in  the clot and biopsy sections.  The plasma cells display lambda light chain restriction consistent with plasma cell neoplasm.  Normal cytogenetics.  FISH studies pending.    04/14/2023 Cancer  Staging   Staging form: Plasma Cell Myeloma and Plasma Cell Disorders, AJCC 8th Edition - Clinical stage from 04/14/2023: RISS Stage II (Beta-2 -microglobulin (mg/L): 8.4, Albumin (g/dL): 2.6, ISS: Stage III, High-risk cytogenetics: Absent, LDH: Normal) - Signed by Babara Call, MD on 04/22/2023 Stage prefix: Initial diagnosis Beta 2 microglobulin range (mg/L): Greater than or equal to 5.5 Albumin range (g/dL): Less than 3.5 Cytogenetics: 1q addition, Other mutation   04/14/2023 Imaging   Skeletal survey 1. No lytic lesions or other intrinsic bony abnormality. 2. Borderline cardiomegaly with an interval decrease in size. 3. Diffuse atheromatous arterial calcifications including bilateral carotid artery calcifications, right greater than left   04/22/2023 - 11/11/2023 Chemotherapy   Patient is on Treatment Plan : MYELOMA NEWLY DIAGNOSED TRANSPLANT CANDIDATE DaraVRd (Daratumumab  SQ) q21d     04/25/2023 Imaging   PET scan showed  1. Two tiny hypermetabolic lucent lesions in the left posteromedial eighth and ninth ribs. No additional evidence of multiple myeloma. 2. Aortic atherosclerosis (ICD10-I70.0). Coronary artery calcification.   07/14/2023 - 07/19/2023 Hospital Admission   Hospitalized due to ileus.    07/25/2023 Bone Marrow Biopsy   A-C. Peripheral blood and bone marrow, (peripheral smear, aspirate smear, touch preparation, clot section, core biopsy):   Persistent plasma cell myeloma, 50-60% lambda restricted plasma cells on core biopsy. Hypercellular bone marrow (70%) with trilineage hematopoiesis. Negative for increased blasts. Negative for significant dysplasia. Negative for amyloid deposition.   01/25/2024 -  Chemotherapy   Patient is on Treatment Plan : MYELOMA RELAPSED/ REFRACTORY Carfilzomib  D1,8,15 (20/27) + selinexor   + Dexamethasone  (KPd) q28d       A Fib. on Eliquis  5mg  BID, follows up with cardiology.  Patient reports being compliant on Eliquis .  Patient is off Lasix  20 mg for  most days, only uses PRN leg edema.  Denies fever, chills, diarrhea, chest pain, nausea vomiting.   MEDICAL HISTORY:  Past Medical History:  Diagnosis Date   Atrial fibrillation (HCC)    Diabetes mellitus without complication (HCC)    Hypercholesteremia    Hypertension    Multiple myeloma not having achieved remission (HCC)     SURGICAL HISTORY: Past Surgical History:  Procedure Laterality Date   CARDIOVERSION N/A 10/17/2023   Procedure: CARDIOVERSION;  Surgeon: Darliss Rogue, MD;  Location: [facility] ORS;  Service: Cardiovascular;  Laterality: N/A;   COLONOSCOPY N/A 02/13/2023   Procedure: COLONOSCOPY;  Surgeon: Toledo, Ladell POUR, MD;  Location: [facility] ENDOSCOPY;  Service: Gastroenterology;  Laterality: N/A;   COLONOSCOPY N/A 02/14/2023   Procedure: COLONOSCOPY;  Surgeon: Toledo, Ladell POUR, MD;  Location: [facility] ENDOSCOPY;  Service: Gastroenterology;  Laterality: N/A;   ESOPHAGOGASTRODUODENOSCOPY N/A 02/13/2023   Procedure: ESOPHAGOGASTRODUODENOSCOPY (EGD);  Surgeon: Toledo, Ladell POUR, MD;  Location: [facility] ENDOSCOPY;  Service: Gastroenterology;  Laterality: N/A;   IR BONE MARROW BIOPSY & ASPIRATION  11/24/2023    SOCIAL HISTORY: Social History   Socioeconomic History   Marital status: Married    Spouse name: Not on file   Number of children: Not on file   Years of education: Not on file   Highest education level: Not on file  Occupational History   Not on file  Tobacco Use  Smoking status: Never   Smokeless tobacco: Never  Vaping Use   Vaping status: Never Used  Substance and Sexual Activity   Alcohol use: Not Currently    Comment: quit approx 2001   Drug use: No   Sexual activity: Not on file  Other Topics Concern   Not on file  Social History Narrative   Not on file   Social Drivers of Health   Financial Resource Strain: Medium Risk (01/13/2024)   Overall Financial Resource Strain (CARDIA)    Difficulty of Paying Living Expenses: Somewhat hard  Food Insecurity: Food  Insecurity Present (01/13/2024)   Hunger Vital Sign    Worried About Running Out of Food in the Last Year: Sometimes true    Ran Out of Food in the Last Year: Sometimes true  Transportation Needs: No Transportation Needs (01/13/2024)   PRAPARE - Administrator, Civil Service (Medical): No    Lack of Transportation (Non-Medical): No  Physical Activity: Insufficiently Active (10/12/2023)   Received from [facility]   Exercise Vital Sign    On average, how many days per week do you engage in moderate to strenuous exercise (like a brisk walk)?: 7 days    On average, how many minutes do you engage in exercise at this level?: 10 min  Stress: No Stress Concern Present (10/12/2023)   Received from [facility] of Occupational Health - Occupational Stress Questionnaire    Feeling of Stress : Not at all  Recent Concern: Stress - Stress Concern Present (10/04/2023)   Received from [facility] of Occupational Health - Occupational Stress Questionnaire    Feeling of Stress : Rather much  Social Connections: Moderately Integrated (10/12/2023)   Received from [facility]   Social Connection and Isolation Panel    In a typical week, how many times do you talk on the phone with family, friends, or neighbors?: More than three times a week    How often do you get together with friends or relatives?: More than three times a week    How often do you attend church or religious services?: 1 to 4 times per year    Do you belong to any clubs or organizations such as church groups, unions, fraternal or athletic groups, or school groups?: No    How often do you attend meetings of the clubs or organizations you belong to?: Never    Are you married, widowed, divorced, separated, never married, or living with a partner?: Married  Intimate Partner Violence: Not At Risk (07/14/2023)   Humiliation, Afraid,  Rape, and Kick questionnaire    Fear of Current or Ex-Partner: No    Emotionally Abused: No    Physically Abused: No    Sexually Abused: No    FAMILY HISTORY: Family History  Problem Relation Age of Onset   Diabetes Mother    Heart attack Father     ALLERGIES:  has no known allergies.  MEDICATIONS:  Current Outpatient Medications  Medication Sig Dispense Refill   acetaminophen  (TYLENOL ) 325 MG tablet Take 2 tablets (650 mg total) by mouth every 6 (six) hours as needed for mild pain (or Fever >/= 101). 90 tablet 1   acyclovir  (ZOVIRAX ) 400 MG tablet Take 1 tablet (400 mg total) by mouth 2 (two) times daily. 180 tablet 0   amiodarone  (PACERONE ) 200 MG tablet Take 1 tablet (200 mg total) by mouth daily.  90 tablet 2   apixaban  (ELIQUIS ) 5 MG TABS tablet Take 1 tablet (5 mg total) by mouth 2 (two) times daily. 60 tablet 5   atorvastatin  (LIPITOR) 10 MG tablet Take 10 mg by mouth daily.     calcium  carbonate (OS-CAL) 600 MG TABS tablet Take 2 tablets (1,200 mg total) by mouth daily.     cholecalciferol (VITAMIN D3) 25 MCG (1000 UNIT) tablet Take 1 tablet (1,000 Units total) by mouth daily.     cyanocobalamin  (VITAMIN B12) 1000 MCG tablet Take 1 tablet (1,000 mcg total) by mouth daily.     dexamethasone  (DECADRON ) 4 MG tablet Take 5 tablets (20 mg total) by mouth once a week. 20 tablet 5   empagliflozin  (JARDIANCE ) 10 MG TABS tablet Take 1 tablet (10 mg total) by mouth daily before breakfast. 30 tablet 2   loperamide  (IMODIUM ) 2 MG capsule Take 1 capsule (2 mg total) by mouth See admin instructions. With onset of diarrhea, take 4 mg,then 2 mg every 2 hours or after every loose bowel movements  maximum: 16 mg/day 60 capsule 2   metoprolol  succinate (TOPROL -XL) 25 MG 24 hr tablet Take 0.5 tablets (12.5 mg total) by mouth daily. 45 tablet 1   Multiple Vitamin (MULTIVITAMIN WITH MINERALS) TABS tablet Take 1 tablet by mouth daily. 90 tablet 1   ondansetron  (ZOFRAN ) 8 MG tablet Take 1 tablet (8 mg  total) by mouth every 8 (eight) hours as needed for nausea or vomiting. 90 tablet 1   pantoprazole  (PROTONIX ) 40 MG tablet Take 1 tablet (40 mg total) by mouth daily. 30 tablet 1   pomalidomide  (POMALYST ) 2 MG capsule Take 1 capsule (2 mg total) by mouth daily. Take for 21 days, then hold for 7 days. Repeat every 28 days. 21 capsule 0   prochlorperazine  (COMPAZINE ) 10 MG tablet Take 1 tablet (10 mg total) by mouth every 6 (six) hours as needed for nausea or vomiting. 90 tablet 1   Selinexor , 40 MG Once Weekly, (XPOVIO , 40 MG ONCE WEEKLY,) 10 MG TBPK Take 40 mg by mouth once a week. 16 each 1   spironolactone  (ALDACTONE ) 25 MG tablet Take 0.5 tablets (12.5 mg total) by mouth daily. 45 tablet 1   temazepam (RESTORIL) 15 MG capsule Take 15 mg by mouth at bedtime.     furosemide  (LASIX ) 20 MG tablet Take 1 tablet (20 mg total) by mouth daily. (Patient not taking: Reported on 07/11/2024)     glipiZIDE (GLUCOTROL XL) 2.5 MG 24 hr tablet Take 5 mg by mouth daily with breakfast. (Patient not taking: Reported on 07/11/2024)     No current facility-administered medications for this visit.    Review of Systems  Constitutional:  Positive for fatigue. Negative for appetite change, chills, fever and unexpected weight change.  HENT:   Negative for hearing loss and voice change.   Eyes:  Negative for eye problems and icterus.  Respiratory:  Negative for chest tightness, cough and shortness of breath.   Cardiovascular:  Negative for chest pain and leg swelling.  Gastrointestinal:  Negative for abdominal distention, abdominal pain and diarrhea.  Endocrine: Negative for hot flashes.  Genitourinary:  Negative for difficulty urinating, dysuria and frequency.   Musculoskeletal:  Negative for arthralgias.  Skin:  Negative for itching and rash.  Neurological:  Negative for light-headedness and numbness.  Hematological:  Negative for adenopathy. Does not bruise/bleed easily.  Psychiatric/Behavioral:  Positive for  sleep disturbance. Negative for confusion.     PHYSICAL EXAMINATION: Vitals:  07/11/24 1003  BP: (!) 128/57  Pulse: 65  Resp: 16  Temp: 97.7 F (36.5 C)  SpO2: 100%   Filed Weights   07/11/24 1003  Weight: 153 lb 4.8 oz (69.5 kg)    Physical Exam Constitutional:      General: He is not in acute distress. HENT:     Head: Normocephalic and atraumatic.     Mouth/Throat:     Comments: Dry oral mucosa  Eyes:     General: No scleral icterus. Cardiovascular:     Rate and Rhythm: Normal rate.  Pulmonary:     Effort: Pulmonary effort is normal. No respiratory distress.     Breath sounds: No wheezing.  Abdominal:     General: Bowel sounds are normal. There is no distension.     Palpations: Abdomen is soft.  Musculoskeletal:        General: Normal range of motion.     Cervical back: Normal range of motion and neck supple.     Right lower leg: No edema.     Left lower leg: No edema.  Skin:    General: Skin is warm and dry.     Findings: No erythema or rash.  Neurological:     Mental Status: He is alert and oriented to person, place, and time. Mental status is at baseline.     Cranial Nerves: No cranial nerve deficit.  Psychiatric:        Mood and Affect: Mood normal.      LABORATORY DATA:  I have reviewed the data as listed    Latest Ref Rng & Units 07/11/2024    9:56 AM 07/04/2024    8:45 AM 06/25/2024   10:43 AM  CBC  WBC 4.0 - 10.5 K/uL 6.0  6.0  4.5   Hemoglobin 13.0 - 17.0 g/dL 8.1  8.0  7.9   Hematocrit 39.0 - 52.0 % 24.7  24.6  24.2   Platelets 150 - 400 K/uL 181  146  97       Latest Ref Rng & Units 07/11/2024    9:56 AM 07/04/2024    8:45 AM 06/22/2024    8:20 AM  CMP  Glucose 70 - 99 mg/dL 827  882  99   BUN 8 - 23 mg/dL 35  37  28   Creatinine 0.61 - 1.24 mg/dL 8.67  8.19  8.68   Sodium 135 - 145 mmol/L 133  136  133   Potassium 3.5 - 5.1 mmol/L 4.4  4.7  4.1   Chloride 98 - 111 mmol/L 105  104  107   CO2 22 - 32 mmol/L 21  21  19     Calcium  8.9 - 10.3 mg/dL 8.5  9.6  7.6   Total Protein 6.5 - 8.1 g/dL 9.3  9.4  9.0   Total Bilirubin 0.0 - 1.2 mg/dL 0.7  0.6  1.0   Alkaline Phos 38 - 126 U/L 114  114  105   AST 15 - 41 U/L 22  29  27    ALT 0 - 44 U/L 18  17  16     Lab Results  Component Value Date   IRON 70 03/28/2024   TIBC 304 03/28/2024   IRONPCTSAT 23 03/28/2024   FERRITIN 535 (H) 03/28/2024     RADIOGRAPHIC STUDIES: I have personally reviewed the radiological images as listed and agreed with the findings in the report. CT ANGIO HEAD NECK W WO CM Result Date: 06/14/2024 EXAM: CTA Head and  Neck with Intravenous Contrast. CT Head without Contrast. CLINICAL HISTORY: Multiple myeloma not having achieved remission (HCC), Syncope and collapse, Carotid stenosis, left. TECHNIQUE: Axial CTA images of the head and neck performed with intravenous contrast. MIP reconstructed images were created and reviewed. Axial computed tomography images of the head/brain performed without intravenous contrast. Note: Per PQRS, the description of internal carotid artery percent stenosis, including 0 percent or normal exam, is based on [place] American Symptomatic Carotid Endarterectomy Trial (NASCET) criteria. Dose reduction technique was used including one or more of the following: automated exposure control, adjustment of mA and kV according to patient size, and/or iterative reconstruction. CONTRAST: Without and with; 75mL (iohexol  (OMNIPAQUE ) 350 MG/ML injection 75 mL IOHEXOL  350 MG/ML SOLN). COMPARISON: CT head 07/14/2023. FINDINGS: CT HEAD: BRAIN: No acute intraparenchymal hemorrhage. No mass lesion. No CT evidence for acute territorial infarct. No midline shift or extra-axial collection. The intracranial internal carotid arteries are patent bilaterally. Atherosclerosis of the bilateral carotid siphons without significant stenosis. The middle cerebral arteries are patent bilaterally. The anterior cerebral arteries are patent bilaterally.  VENTRICLES: No hydrocephalus. ORBITS: The orbits are unremarkable. SINUSES AND MASTOIDS: Mild mucosal thickening in the ethmoid sinuses. The mastoid air cells are clear. CTA NECK: COMMON CAROTID ARTERIES: Mild atherosclerosis of the visualized aortic arch. The right common carotid artery is patent from the origin to the skull base. Atherosclerosis of the distal right common carotid artery resulting in mild stenosis. The left common carotid artery is patent from the origin to the skull base. Atherosclerosis along the distal left common carotid artery resulting in mild stenosis. INTERNAL CAROTID ARTERIES: Right: Atherosclerosis at the right carotid bifurcation and proximal right cervical ICA with resulting 50% stenosis of the proximal right cervical ICA. Mild tortuosity of the distal right cervical ICA. Left: Atherosclerosis at the left carotid bifurcation without hemodynamically significant stenosis. VERTEBRAL ARTERIES: The left vertebral artery is dominant. The vertebral arteries are patent from the origins to the vertebrobasilar confluence. No significant stenosis. No dissection or occlusion. CTA HEAD: ANTERIOR CEREBRAL ARTERIES: Patent bilaterally. No significant stenosis. No occlusion. No aneurysm. MIDDLE CEREBRAL ARTERIES: Patent bilaterally. No significant stenosis. No occlusion. No aneurysm. POSTERIOR CEREBRAL ARTERIES: No significant stenosis. No occlusion. No aneurysm. BASILAR ARTERY: No significant stenosis. No occlusion. No aneurysm. OTHER: SOFT TISSUES: Subcentimeter nodule within the left thyroid lobe. 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 BONES: Edentulous maxilla and mandible. Degenerative changes throughout the visualized spine. Chronic deformity of the lateral aspect of the left clavicle. No acute osseous abnormality. IMPRESSION: 1. No large vessel occlusion. 2. No acute intracranial abnormality. 3. Atherosclerosis at the  right carotid bifurcation resulting in 50% stenosis of the proximal right cervical ICA. 4. Atherosclerosis along the distal left common carotid artery resulting in mild stenosis and at the left carotid bifurcation without significant stenosis. Electronically signed by: Donnice Mania MD 06/14/2024 04:48 PM EDT RP Workstation: [id number]    Disposition: Continue current dose of selinexor /pomalyst  F/u in 1 week cbc, bmp NP/MD   Morna Husband, AGNP-C

## 2024-07-13 ENCOUNTER — Encounter: Payer: Self-pay | Admitting: Oncology

## 2024-07-17 ENCOUNTER — Other Ambulatory Visit: Payer: Self-pay

## 2024-07-17 DIAGNOSIS — C9000 Multiple myeloma not having achieved remission: Secondary | ICD-10-CM

## 2024-07-17 MED ORDER — POMALIDOMIDE 2 MG PO CAPS: 2.0000 mg | ORAL_CAPSULE | Freq: Every day | ORAL | 0 refills | Status: DC

## 2024-07-19 ENCOUNTER — Inpatient Hospital Stay: Attending: Oncology

## 2024-07-19 ENCOUNTER — Encounter: Payer: Self-pay | Admitting: Oncology

## 2024-07-19 ENCOUNTER — Inpatient Hospital Stay (HOSPITAL_BASED_OUTPATIENT_CLINIC_OR_DEPARTMENT_OTHER): Admitting: Oncology

## 2024-07-19 VITALS — BP 130/72 | HR 69 | Temp 95.8°F | Resp 18 | Wt 153.9 lb

## 2024-07-19 DIAGNOSIS — N1832 Chronic kidney disease, stage 3b: Secondary | ICD-10-CM | POA: Diagnosis not present

## 2024-07-19 DIAGNOSIS — I5031 Acute diastolic (congestive) heart failure: Secondary | ICD-10-CM | POA: Insufficient documentation

## 2024-07-19 DIAGNOSIS — T451X5A Adverse effect of antineoplastic and immunosuppressive drugs, initial encounter: Secondary | ICD-10-CM

## 2024-07-19 DIAGNOSIS — Z79624 Long term (current) use of inhibitors of nucleotide synthesis: Secondary | ICD-10-CM | POA: Insufficient documentation

## 2024-07-19 DIAGNOSIS — Z5112 Encounter for antineoplastic immunotherapy: Secondary | ICD-10-CM | POA: Diagnosis present

## 2024-07-19 DIAGNOSIS — Z79899 Other long term (current) drug therapy: Secondary | ICD-10-CM | POA: Diagnosis not present

## 2024-07-19 DIAGNOSIS — I4891 Unspecified atrial fibrillation: Secondary | ICD-10-CM | POA: Insufficient documentation

## 2024-07-19 DIAGNOSIS — N183 Chronic kidney disease, stage 3 unspecified: Secondary | ICD-10-CM | POA: Diagnosis not present

## 2024-07-19 DIAGNOSIS — D630 Anemia in neoplastic disease: Secondary | ICD-10-CM | POA: Insufficient documentation

## 2024-07-19 DIAGNOSIS — C9000 Multiple myeloma not having achieved remission: Secondary | ICD-10-CM | POA: Diagnosis present

## 2024-07-19 DIAGNOSIS — D6481 Anemia due to antineoplastic chemotherapy: Secondary | ICD-10-CM

## 2024-07-19 DIAGNOSIS — Z5111 Encounter for antineoplastic chemotherapy: Secondary | ICD-10-CM

## 2024-07-19 LAB — CMP (CANCER CENTER ONLY)
ALT: 15 U/L (ref 0–44)
AST: 22 U/L (ref 15–41)
Albumin: 2.9 g/dL — ABNORMAL LOW (ref 3.5–5.0)
Alkaline Phosphatase: 129 U/L — ABNORMAL HIGH (ref 38–126)
Anion gap: 7 (ref 5–15)
BUN: 27 mg/dL — ABNORMAL HIGH (ref 8–23)
CO2: 20 mmol/L — ABNORMAL LOW (ref 22–32)
Calcium: 8.9 mg/dL (ref 8.9–10.3)
Chloride: 106 mmol/L (ref 98–111)
Creatinine: 1.11 mg/dL (ref 0.61–1.24)
GFR, Estimated: >60 mL/min (ref >60)
Glucose, Bld: 206 mg/dL — ABNORMAL HIGH (ref 70–99)
Potassium: 4.3 mmol/L (ref 3.5–5.1)
Sodium: 133 mmol/L — ABNORMAL LOW (ref 135–145)
Total Bilirubin: 0.6 mg/dL (ref 0.0–1.2)
Total Protein: 9.1 g/dL — ABNORMAL HIGH (ref 6.5–8.1)

## 2024-07-19 LAB — CBC WITH DIFFERENTIAL (CANCER CENTER ONLY)
Abs Immature Granulocytes: 0.28 K/uL — ABNORMAL HIGH (ref 0.00–0.07)
Basophils Absolute: 0 K/uL (ref 0.0–0.1)
Basophils Relative: 0 %
Eosinophils Absolute: 0.1 K/uL (ref 0.0–0.5)
Eosinophils Relative: 1 %
HCT: 25.6 % — ABNORMAL LOW (ref 39.0–52.0)
Hemoglobin: 8.5 g/dL — ABNORMAL LOW (ref 13.0–17.0)
Immature Granulocytes: 5 %
Lymphocytes Relative: 10 %
Lymphs Abs: 0.6 K/uL — ABNORMAL LOW (ref 0.7–4.0)
MCH: 35.9 pg — ABNORMAL HIGH (ref 26.0–34.0)
MCHC: 33.2 g/dL (ref 30.0–36.0)
MCV: 108 fL — ABNORMAL HIGH (ref 80.0–100.0)
Monocytes Absolute: 0.5 K/uL (ref 0.1–1.0)
Monocytes Relative: 9 %
Neutro Abs: 4.4 K/uL (ref 1.7–7.7)
Neutrophils Relative %: 75 %
Platelet Count: 164 K/uL (ref 150–400)
RBC: 2.37 MIL/uL — ABNORMAL LOW (ref 4.22–5.81)
RDW: 17.5 % — ABNORMAL HIGH (ref 11.5–15.5)
WBC Count: 5.8 K/uL (ref 4.0–10.5)
nRBC: 0.7 % — ABNORMAL HIGH (ref 0.0–0.2)

## 2024-07-19 NOTE — Assessment & Plan Note (Addendum)
 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   Labs are reviewed and discussed with patient. Cycle 2 dose reduced Selinexor  to 40mg , with Pomalyst  2 mg day 1-21, and dexamethasone  20 mg weekly  He tolerates better. Continue and finished D15-21 Pomalyst , weekly Selinexor  /Dexamethasone    Continue Acyclovir , At risk of thrombosis, on Eliquis  5mg  BID for A fib Xegeva monthly if calcium  meets criteria - held since CHF, plan to resume in the future.  Recommend calcium  and vitamin D  supplementation.

## 2024-07-19 NOTE — Assessment & Plan Note (Addendum)
 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  Lab Results  Component Value Date   HGB 8.5 (L) 07/19/2024   TIBC 304 03/28/2024   IRONPCTSAT 23 03/28/2024   FERRITIN 535 (H) 03/28/2024    Decreased due to chemotherapy. Monitor closely.

## 2024-07-19 NOTE — Assessment & Plan Note (Signed)
 Encourage oral hydration and avoid nephrotoxins.  Fluctuate Cr levels.

## 2024-07-19 NOTE — Assessment & Plan Note (Signed)
 Chemotherapy plan as listed above

## 2024-07-19 NOTE — Progress Notes (Signed)
 Hematology/Oncology Progress note [phone] [phone]         Patient Care Team: Cleotilde Oneil FALCON, MD as PCP - General (Internal Medicine) Darliss Rogue, MD as PCP - Cardiology (Cardiology) Kennyth Chew, MD as PCP - Electrophysiology (Clinical Cardiac Electrophysiology) Babara Call, MD as Consulting Physician (Oncology) Riddle, Suzann, NP as Nurse Practitioner (Clinical Cardiac Electrophysiology) Franchester Mikey DEL, PA-C as Physician Assistant (Cardiology)  CHIEF COMPLAINTS/REASON FOR VISIT:  Multiple myeloma   ASSESSMENT & PLAN:   Cancer Staging  Multiple myeloma [facility]) Staging form: Plasma Cell Myeloma and Plasma Cell Disorders, AJCC 8th Edition - Clinical stage from 04/14/2023: RISS Stage II (Beta-2 -microglobulin (mg/L): 8.4, Albumin (g/dL): 2.6, ISS: Stage III, High-risk cytogenetics: Absent, LDH: Normal) - Signed by Babara Call, MD on 04/22/2023   Multiple myeloma (HCC) IgA lamda multiple myeloma, 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   Labs are reviewed and discussed with patient. Cycle 2 dose reduced Selinexor  to 40mg , with Pomalyst  2 mg day 1-21, and dexamethasone  20 mg weekly  He tolerates better. Continue and finished D15-21 Pomalyst , weekly Selinexor  /Dexamethasone    Continue Acyclovir , At risk of thrombosis, on Eliquis  5mg  BID for A fib Xegeva monthly if calcium  meets criteria - held since CHF, plan to resume in the future.  Recommend calcium  and vitamin D  supplementation.    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  Lab Results  Component Value Date   HGB 8.5 (L) 07/19/2024   TIBC 304  03/28/2024   IRONPCTSAT 23 03/28/2024   FERRITIN 535 (H) 03/28/2024    Decreased due to chemotherapy. Monitor closely.   CKD (chronic kidney disease) stage 3, GFR 30-59 ml/min (HCC) Encourage oral hydration and avoid nephrotoxins.  Fluctuate Cr levels.    Diastolic CHF, acute (HCC) Follow up with cardiology Patient is on Lasix  20 mg daily. PRN leg edema  Encounter for antineoplastic chemotherapy Chemotherapy plan as listed above.   Orders Placed This Encounter  Procedures   CBC with Differential (Cancer Center Only)    Standing Status:   Future    Expected Date:   08/02/2024    Expiration Date:   10/31/2024   CMP (Cancer Center only)    Standing Status:   Future    Expected Date:   08/02/2024    Expiration Date:   10/31/2024   Multiple Myeloma Panel (SPEP&IFE w/QIG)    Standing Status:   Future    Expected Date:   08/02/2024    Expiration Date:   10/31/2024   Kappa/lambda light chains    Standing Status:   Future    Expected Date:   08/02/2024    Expiration Date:   10/31/2024   Follow-up 2 weeks   We spent sufficient time to discuss many aspect of care, questions were answered to patient's satisfaction.    Call Babara, MD, PhD [facility] Hematology Oncology 07/19/2024     HISTORY OF PRESENTING ILLNESS:  Victor Phillips is a  68 y.o.  male with PMH listed below who was referred to me for anemia  02/11/2023 - 02/14/2023 recent hospitalization due to pneumonia, respiratory failure.  He was found to  have a hemoglobin decreased at 6.8, status post PRBC transfusion during admission.  EGD showed gastritis.  Colonoscopy was not remarkable. 02/11/2023 TIBC 221 ferritin 107, iron saturation 16. Patient is currently taking fusion plus Vitamin B12 level in the 300s. His echo showed grade 2 diastolic CHF.  He denies recent chest pain on exertion, shortness of breath on minimal exertion, pre-syncopal episodes, or palpitations He had not noticed any recent bleeding such as epistaxis,  hematuria or hematochezia.  He denies over the counter NSAID ingestion.  Oncology History  Multiple myeloma (HCC)  04/14/2023 Initial Diagnosis   Multiple myeloma   03/13/2020 multiple myeloma panel showed M protein 4.3, IgA lambda Free lamda Level 142,-light chain ratio 0.07 04/05/2023 bone marrow biopsy showed hypercellular bone marrow with plasma cell neoplasm.The bone marrow is hypercellular for age with prominent increase in plasma cells representing 65% of all cells in the aspirate associated with interstitial infiltrates and numerous variably sized aggregates in  the clot and biopsy sections.  The plasma cells display lambda light chain restriction consistent with plasma cell neoplasm.  Normal cytogenetics.  FISH studies pending.    04/14/2023 Cancer Staging   Staging form: Plasma Cell Myeloma and Plasma Cell Disorders, AJCC 8th Edition - Clinical stage from 04/14/2023: RISS Stage II (Beta-2 -microglobulin (mg/L): 8.4, Albumin (g/dL): 2.6, ISS: Stage III, High-risk cytogenetics: Absent, LDH: Normal) - Signed by Babara Call, MD on 04/22/2023 Stage prefix: Initial diagnosis Beta 2 microglobulin range (mg/L): Greater than or equal to 5.5 Albumin range (g/dL): Less than 3.5 Cytogenetics: 1q addition, Other mutation   04/14/2023 Imaging   Skeletal survey 1. No lytic lesions or other intrinsic bony abnormality. 2. Borderline cardiomegaly with an interval decrease in size. 3. Diffuse atheromatous arterial calcifications including bilateral carotid artery calcifications, right greater than left   04/22/2023 - 11/11/2023 Chemotherapy   Patient is on Treatment Plan : MYELOMA NEWLY DIAGNOSED TRANSPLANT CANDIDATE DaraVRd (Daratumumab  SQ) q21d     04/25/2023 Imaging   PET scan showed  1. Two tiny hypermetabolic lucent lesions in the left posteromedial eighth and ninth ribs. No additional evidence of multiple myeloma. 2. Aortic atherosclerosis (ICD10-I70.0). Coronary artery calcification.   07/14/2023 -  07/19/2023 Hospital Admission   Hospitalized due to ileus.    07/25/2023 Bone Marrow Biopsy   A-C. Peripheral blood and bone marrow, (peripheral smear, aspirate smear, touch preparation, clot section, core biopsy):   Persistent plasma cell myeloma, 50-60% lambda restricted plasma cells on core biopsy. Hypercellular bone marrow (70%) with trilineage hematopoiesis. Negative for increased blasts. Negative for significant dysplasia. Negative for amyloid deposition.   01/25/2024 -  Chemotherapy   Patient is on Treatment Plan : MYELOMA RELAPSED/ REFRACTORY Carfilzomib  D1,8,15 (20/27) + selinexor   + Dexamethasone  (KPd) q28d       A Fib. on Eliquis  5mg  BID, follows up with cardiology.  Patient reports being compliant on Eliquis .  Patient is off Lasix  20 mg for most days, only uses PRN leg edema.  Denies fever, chills, diarrhea, chest pain, nausea vomiting.   MEDICAL HISTORY:  Past Medical History:  Diagnosis Date   Atrial fibrillation (HCC)    Diabetes mellitus without complication (HCC)    Hypercholesteremia    Hypertension    Multiple myeloma not having achieved remission (HCC)     SURGICAL HISTORY: Past Surgical History:  Procedure Laterality Date   CARDIOVERSION N/A 10/17/2023   Procedure: CARDIOVERSION;  Surgeon: Darliss Rogue, MD;  Location: [facility] ORS;  Service: Cardiovascular;  Laterality: N/A;   COLONOSCOPY N/A 02/13/2023  Procedure: COLONOSCOPY;  Surgeon: Toledo, Ladell POUR, MD;  Location: [facility] ENDOSCOPY;  Service: Gastroenterology;  Laterality: N/A;   COLONOSCOPY N/A 02/14/2023   Procedure: COLONOSCOPY;  Surgeon: Toledo, Ladell POUR, MD;  Location: [facility] ENDOSCOPY;  Service: Gastroenterology;  Laterality: N/A;   ESOPHAGOGASTRODUODENOSCOPY N/A 02/13/2023   Procedure: ESOPHAGOGASTRODUODENOSCOPY (EGD);  Surgeon: Toledo, Ladell POUR, MD;  Location: [facility] ENDOSCOPY;  Service: Gastroenterology;  Laterality: N/A;   IR BONE MARROW BIOPSY & ASPIRATION  11/24/2023    SOCIAL HISTORY: Social  History   Socioeconomic History   Marital status: Married    Spouse name: Not on file   Number of children: Not on file   Years of education: Not on file   Highest education level: Not on file  Occupational History   Not on file  Tobacco Use   Smoking status: Never   Smokeless tobacco: Never  Vaping Use   Vaping status: Never Used  Substance and Sexual Activity   Alcohol use: Not Currently    Comment: quit approx 2001   Drug use: No   Sexual activity: Not on file  Other Topics Concern   Not on file  Social History Narrative   Not on file   Social Drivers of Health   Financial Resource Strain: Medium Risk (01/13/2024)   Overall Financial Resource Strain (CARDIA)    Difficulty of Paying Living Expenses: Somewhat hard  Food Insecurity: Food Insecurity Present (01/13/2024)   Hunger Vital Sign    Worried About Running Out of Food in the Last Year: Sometimes true    Ran Out of Food in the Last Year: Sometimes true  Transportation Needs: No Transportation Needs (01/13/2024)   PRAPARE - Administrator, Civil Service (Medical): No    Lack of Transportation (Non-Medical): No  Physical Activity: Insufficiently Active (10/12/2023)   Received from [facility]   Exercise Vital Sign    On average, how many days per week do you engage in moderate to strenuous exercise (like a brisk walk)?: 7 days    On average, how many minutes do you engage in exercise at this level?: 10 min  Stress: No Stress Concern Present (10/12/2023)   Received from [facility] - Occupational Stress Questionnaire    Feeling of Stress : Not at all  Recent Concern: Stress - Stress Concern Present (10/04/2023)   Received from [facility] - Occupational Stress Questionnaire    Feeling of Stress : Rather much  Social Connections: Moderately Integrated (10/12/2023)   Received  from [facility]   Social Connection and Isolation Panel    In a typical week, how many times do you talk on the phone with family, friends, or neighbors?: More than three times a week    How often do you get together with friends or relatives?: More than three times a week    How often do you attend church or religious services?: 1 to 4 times per year    Do you belong to any clubs or organizations such as church groups, unions, fraternal or athletic groups, or school groups?: No    How often do you attend meetings of the clubs or organizations you belong to?: Never    Are you married, widowed, divorced, separated, never married, or living with a partner?: Married  Intimate Partner Violence: Not At Risk (07/14/2023)   Humiliation, Afraid, Rape, and Kick questionnaire  Fear of Current or Ex-Partner: No    Emotionally Abused: No    Physically Abused: No    Sexually Abused: No    FAMILY HISTORY: Family History  Problem Relation Age of Onset   Diabetes Mother    Heart attack Father     ALLERGIES:  has no known allergies.  MEDICATIONS:  Current Outpatient Medications  Medication Sig Dispense Refill   acetaminophen  (TYLENOL ) 325 MG tablet Take 2 tablets (650 mg total) by mouth every 6 (six) hours as needed for mild pain (or Fever >/= 101). 90 tablet 1   acyclovir  (ZOVIRAX ) 400 MG tablet Take 1 tablet (400 mg total) by mouth 2 (two) times daily. 180 tablet 0   amiodarone  (PACERONE ) 200 MG tablet Take 1 tablet (200 mg total) by mouth daily. 90 tablet 2   apixaban  (ELIQUIS ) 5 MG TABS tablet Take 1 tablet (5 mg total) by mouth 2 (two) times daily. 60 tablet 5   atorvastatin  (LIPITOR) 10 MG tablet Take 10 mg by mouth daily.     calcium  carbonate (OS-CAL) 600 MG TABS tablet Take 2 tablets (1,200 mg total) by mouth daily.     cholecalciferol (VITAMIN D3) 25 MCG (1000 UNIT) tablet Take 1 tablet (1,000 Units total) by mouth daily.     cyanocobalamin  (VITAMIN B12) 1000 MCG  tablet Take 1 tablet (1,000 mcg total) by mouth daily.     dexamethasone  (DECADRON ) 4 MG tablet Take 5 tablets (20 mg total) by mouth once a week. 20 tablet 5   empagliflozin  (JARDIANCE ) 10 MG TABS tablet Take 1 tablet (10 mg total) by mouth daily before breakfast. 30 tablet 2   loperamide  (IMODIUM ) 2 MG capsule Take 1 capsule (2 mg total) by mouth See admin instructions. With onset of diarrhea, take 4 mg,then 2 mg every 2 hours or after every loose bowel movements  maximum: 16 mg/day 60 capsule 2   metoprolol  succinate (TOPROL -XL) 25 MG 24 hr tablet Take 0.5 tablets (12.5 mg total) by mouth daily. 45 tablet 1   Multiple Vitamin (MULTIVITAMIN WITH MINERALS) TABS tablet Take 1 tablet by mouth daily. 90 tablet 1   pantoprazole  (PROTONIX ) 40 MG tablet Take 1 tablet (40 mg total) by mouth daily. 30 tablet 1   pomalidomide  (POMALYST ) 2 MG capsule Take 1 capsule (2 mg total) by mouth daily. Take for 21 days, then hold for 7 days. Repeat every 28 days. 21 capsule 0   Selinexor , 40 MG Once Weekly, (XPOVIO , 40 MG ONCE WEEKLY,) 10 MG TBPK Take 40 mg by mouth once a week. 16 each 1   temazepam (RESTORIL) 15 MG capsule Take 15 mg by mouth at bedtime.     furosemide  (LASIX ) 20 MG tablet Take 1 tablet (20 mg total) by mouth daily. (Patient not taking: Reported on 07/19/2024)     glipiZIDE (GLUCOTROL XL) 2.5 MG 24 hr tablet Take 5 mg by mouth daily with breakfast. (Patient not taking: Reported on 07/19/2024)     ondansetron  (ZOFRAN ) 8 MG tablet Take 1 tablet (8 mg total) by mouth every 8 (eight) hours as needed for nausea or vomiting. (Patient not taking: Reported on 07/19/2024) 90 tablet 1   prochlorperazine  (COMPAZINE ) 10 MG tablet Take 1 tablet (10 mg total) by mouth every 6 (six) hours as needed for nausea or vomiting. (Patient not taking: Reported on 07/19/2024) 90 tablet 1   spironolactone  (ALDACTONE ) 25 MG tablet Take 0.5 tablets (12.5 mg total) by mouth daily. (Patient not taking: Reported on 07/19/2024) 45 tablet  1   No current facility-administered medications for this visit.    Review of Systems  Constitutional:  Positive for fatigue. Negative for appetite change, chills, fever and unexpected weight change.  HENT:   Negative for hearing loss and voice change.   Eyes:  Negative for eye problems and icterus.  Respiratory:  Negative for chest tightness, cough and shortness of breath.   Cardiovascular:  Negative for chest pain and leg swelling.  Gastrointestinal:  Negative for abdominal distention, abdominal pain and diarrhea.  Endocrine: Negative for hot flashes.  Genitourinary:  Negative for difficulty urinating, dysuria and frequency.   Musculoskeletal:  Negative for arthralgias.  Skin:  Negative for itching and rash.  Neurological:  Negative for light-headedness and numbness.  Hematological:  Negative for adenopathy. Does not bruise/bleed easily.  Psychiatric/Behavioral:  Positive for sleep disturbance. Negative for confusion.     PHYSICAL EXAMINATION: Vitals:   07/19/24 1027 07/19/24 1040  BP: (!) 141/72 130/72  Pulse: 69   Resp: 18   Temp: (!) 95.8 F (35.4 C)   SpO2: 100%    Filed Weights   07/19/24 1027  Weight: 153 lb 14.4 oz (69.8 kg)    Physical Exam Constitutional:      General: He is not in acute distress.    Appearance: He is obese.  HENT:     Head: Normocephalic and atraumatic.     Mouth/Throat:     Comments: Dry oral mucosa  Eyes:     General: No scleral icterus. Cardiovascular:     Rate and Rhythm: Normal rate.  Pulmonary:     Effort: Pulmonary effort is normal. No respiratory distress.     Breath sounds: No wheezing.  Abdominal:     General: Bowel sounds are normal. There is no distension.     Palpations: Abdomen is soft.  Musculoskeletal:        General: Normal range of motion.     Cervical back: Normal range of motion and neck supple.     Right lower leg: No edema.     Left lower leg: No edema.  Skin:    General: Skin is warm and dry.      Findings: No erythema or rash.  Neurological:     Mental Status: He is alert and oriented to person, place, and time. Mental status is at baseline.     Cranial Nerves: No cranial nerve deficit.  Psychiatric:        Mood and Affect: Mood normal.      LABORATORY DATA:  I have reviewed the data as listed    Latest Ref Rng & Units 07/19/2024   10:20 AM 07/11/2024    9:56 AM 07/04/2024    8:45 AM  CBC  WBC 4.0 - 10.5 K/uL 5.8  6.0  6.0   Hemoglobin 13.0 - 17.0 g/dL 8.5  8.1  8.0   Hematocrit 39.0 - 52.0 % 25.6  24.7  24.6   Platelets 150 - 400 K/uL 164  181  146       Latest Ref Rng & Units 07/19/2024   10:20 AM 07/11/2024    9:56 AM 07/04/2024    8:45 AM  CMP  Glucose 70 - 99 mg/dL 793  827  882   BUN 8 - 23 mg/dL 27  35  37   Creatinine 0.61 - 1.24 mg/dL 8.88  8.67  8.19   Sodium 135 - 145 mmol/L 133  133  136   Potassium 3.5 - 5.1 mmol/L 4.3  4.4  4.7   Chloride 98 - 111 mmol/L 106  105  104   CO2 22 - 32 mmol/L 20  21  21    Calcium  8.9 - 10.3 mg/dL 8.9  8.5  9.6   Total Protein 6.5 - 8.1 g/dL 9.1  9.3  9.4   Total Bilirubin 0.0 - 1.2 mg/dL 0.6  0.7  0.6   Alkaline Phos 38 - 126 U/L 129  114  114   AST 15 - 41 U/L 22  22  29    ALT 0 - 44 U/L 15  18  17     Lab Results  Component Value Date   IRON 70 03/28/2024   TIBC 304 03/28/2024   IRONPCTSAT 23 03/28/2024   FERRITIN 535 (H) 03/28/2024     RADIOGRAPHIC STUDIES: I have personally reviewed the radiological images as listed and agreed with the findings in the report. No results found.

## 2024-07-19 NOTE — Assessment & Plan Note (Signed)
 Follow up with cardiology Patient is on Lasix  20 mg daily. PRN leg edema

## 2024-07-20 ENCOUNTER — Other Ambulatory Visit: Payer: Self-pay

## 2024-07-22 ENCOUNTER — Other Ambulatory Visit: Payer: Self-pay

## 2024-07-25 ENCOUNTER — Other Ambulatory Visit: Payer: Self-pay

## 2024-08-02 ENCOUNTER — Other Ambulatory Visit: Payer: Self-pay

## 2024-08-02 ENCOUNTER — Inpatient Hospital Stay

## 2024-08-02 ENCOUNTER — Inpatient Hospital Stay: Admitting: Oncology

## 2024-08-02 ENCOUNTER — Encounter: Payer: Self-pay | Admitting: Oncology

## 2024-08-02 ENCOUNTER — Telehealth: Payer: Self-pay

## 2024-08-02 ENCOUNTER — Other Ambulatory Visit: Payer: Self-pay | Admitting: Oncology

## 2024-08-02 VITALS — BP 121/53 | HR 68 | Temp 96.9°F | Resp 18 | Wt 153.0 lb

## 2024-08-02 DIAGNOSIS — C9000 Multiple myeloma not having achieved remission: Secondary | ICD-10-CM

## 2024-08-02 DIAGNOSIS — Z5112 Encounter for antineoplastic immunotherapy: Secondary | ICD-10-CM | POA: Diagnosis not present

## 2024-08-02 DIAGNOSIS — T451X5A Adverse effect of antineoplastic and immunosuppressive drugs, initial encounter: Secondary | ICD-10-CM

## 2024-08-02 DIAGNOSIS — D6481 Anemia due to antineoplastic chemotherapy: Secondary | ICD-10-CM | POA: Diagnosis not present

## 2024-08-02 DIAGNOSIS — I5031 Acute diastolic (congestive) heart failure: Secondary | ICD-10-CM | POA: Diagnosis not present

## 2024-08-02 DIAGNOSIS — D649 Anemia, unspecified: Secondary | ICD-10-CM

## 2024-08-02 DIAGNOSIS — N1832 Chronic kidney disease, stage 3b: Secondary | ICD-10-CM

## 2024-08-02 LAB — CBC WITH DIFFERENTIAL (CANCER CENTER ONLY)
Abs Immature Granulocytes: 0.06 K/uL (ref 0.00–0.07)
Basophils Absolute: 0 K/uL (ref 0.0–0.1)
Basophils Relative: 0 %
Eosinophils Absolute: 0.1 K/uL (ref 0.0–0.5)
Eosinophils Relative: 4 %
HCT: 19.5 % — ABNORMAL LOW (ref 39.0–52.0)
Hemoglobin: 6.4 g/dL — CL (ref 13.0–17.0)
Immature Granulocytes: 2 %
Lymphocytes Relative: 22 %
Lymphs Abs: 0.7 K/uL (ref 0.7–4.0)
MCH: 35 pg — ABNORMAL HIGH (ref 26.0–34.0)
MCHC: 32.8 g/dL (ref 30.0–36.0)
MCV: 106.6 fL — ABNORMAL HIGH (ref 80.0–100.0)
Monocytes Absolute: 0.5 K/uL (ref 0.1–1.0)
Monocytes Relative: 15 %
Neutro Abs: 1.8 K/uL (ref 1.7–7.7)
Neutrophils Relative %: 57 %
Platelet Count: 152 K/uL (ref 150–400)
RBC: 1.83 MIL/uL — ABNORMAL LOW (ref 4.22–5.81)
RDW: 16 % — ABNORMAL HIGH (ref 11.5–15.5)
WBC Count: 3.2 K/uL — ABNORMAL LOW (ref 4.0–10.5)
nRBC: 0 % (ref 0.0–0.2)

## 2024-08-02 LAB — CMP (CANCER CENTER ONLY)
ALT: 14 U/L (ref 0–44)
AST: 22 U/L (ref 15–41)
Albumin: 3 g/dL — ABNORMAL LOW (ref 3.5–5.0)
Alkaline Phosphatase: 128 U/L — ABNORMAL HIGH (ref 38–126)
Anion gap: 10 (ref 5–15)
BUN: 28 mg/dL — ABNORMAL HIGH (ref 8–23)
CO2: 20 mmol/L — ABNORMAL LOW (ref 22–32)
Calcium: 8.5 mg/dL — ABNORMAL LOW (ref 8.9–10.3)
Chloride: 104 mmol/L (ref 98–111)
Creatinine: 1.61 mg/dL — ABNORMAL HIGH (ref 0.61–1.24)
GFR, Estimated: 47 mL/min — ABNORMAL LOW (ref >60)
Glucose, Bld: 147 mg/dL — ABNORMAL HIGH (ref 70–99)
Potassium: 4.5 mmol/L (ref 3.5–5.1)
Sodium: 134 mmol/L — ABNORMAL LOW (ref 135–145)
Total Bilirubin: 0.5 mg/dL (ref 0.0–1.2)
Total Protein: 9.8 g/dL — ABNORMAL HIGH (ref 6.5–8.1)

## 2024-08-02 LAB — PREPARE RBC (CROSSMATCH)

## 2024-08-02 NOTE — Telephone Encounter (Signed)
 Critical lab received from [place] at [place] lab. Hemoglobin 6.4. MD notified and will see pt today.

## 2024-08-02 NOTE — Progress Notes (Signed)
 Calcium  at 8.5; per provider parameters is 8.6 or greater

## 2024-08-02 NOTE — Assessment & Plan Note (Signed)
 Follow up with cardiology Patient is on Lasix  20 mg daily. PRN leg edema

## 2024-08-02 NOTE — Assessment & Plan Note (Addendum)
 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   Labs are reviewed and discussed with patient.   07/05/2024 cycle 2 Selinexor  to 40mg , with Pomalyst  2 mg day 1-21, and dexamethasone  20 mg weekly  Hold cycle 3 due to severe anemia. This is likely due to marrow suppression, patient continued pomalyst  beyond D21.  I plan to see him back in 2 weeks to start next cycle of treatments.  Will also get him a medication calendar which may help him to manage medication better.   Continue Acyclovir , At risk of thrombosis, on Eliquis  5mg  BID for A fib Xegeva monthly if calcium  meets criteria - held due to hypocalcemia.  Recommend calcium  and vitamin D  supplementation.

## 2024-08-02 NOTE — Assessment & Plan Note (Addendum)
 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  Lab Results  Component Value Date   HGB 6.4 (LL) 08/02/2024   TIBC 304 03/28/2024   IRONPCTSAT 23 03/28/2024   FERRITIN 535 (H) 03/28/2024    Decreased due to chemotherapy. Monitor closely. Repeat iron panel today.  1 unit of PRBC transfusion. Repeat cbc next week

## 2024-08-02 NOTE — Assessment & Plan Note (Signed)
 Encourage oral hydration and avoid nephrotoxins.  Fluctuate Cr levels.

## 2024-08-02 NOTE — Progress Notes (Signed)
 Hematology/Oncology Progress note [phone] [phone]         Patient Care Team: Victor Oneil FALCON, MD as PCP - General (Internal Medicine) Victor Rogue, MD as PCP - Cardiology (Cardiology) Victor Chew, MD as PCP - Electrophysiology (Clinical Cardiac Electrophysiology) Victor Call, MD as Consulting Physician (Oncology) Phillips, Suzann, NP as Nurse Practitioner (Clinical Cardiac Electrophysiology) Victor Victor DEL, PA-C as Physician Assistant (Cardiology)  CHIEF COMPLAINTS/REASON FOR VISIT:  Multiple myeloma   ASSESSMENT & PLAN:   Cancer Staging  Multiple myeloma [facility]) Staging form: Plasma Cell Myeloma and Plasma Cell Disorders, AJCC 8th Edition - Clinical stage from 04/14/2023: RISS Stage II (Beta-2 -microglobulin (mg/L): 8.4, Albumin (g/dL): 2.6, ISS: Stage III, High-risk cytogenetics: Absent, LDH: Normal) - Signed by Victor Call, MD on 04/22/2023   Multiple myeloma (HCC) IgA lamda multiple myeloma, 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   Labs are reviewed and discussed with patient.   07/05/2024 cycle 2 Selinexor  to 40mg , with Pomalyst  2 mg day 1-21, and dexamethasone  20 mg weekly  Hold cycle 3 due to severe anemia. This is likely due to marrow suppression, patient continued pomalyst  beyond D21.  I plan to see him back in 2 weeks to start next cycle of treatments.  Will also get him a medication calendar which may help him to manage medication better.   Continue Acyclovir , At risk of thrombosis, on Eliquis  5mg  BID for A fib Xegeva monthly if calcium  meets criteria - held due to hypocalcemia.  Recommend calcium  and vitamin D  supplementation.    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  Lab Results  Component Value Date   HGB 6.4 (LL) 08/02/2024   TIBC 304 03/28/2024   IRONPCTSAT 23 03/28/2024   FERRITIN 535 (H) 03/28/2024    Decreased due to chemotherapy. Monitor closely. Repeat iron panel today.  1 unit of PRBC transfusion. Repeat cbc next week  CKD (chronic kidney disease) stage 3, GFR 30-59 ml/min (HCC) Encourage oral hydration and avoid nephrotoxins.  Fluctuate Cr levels.    Diastolic CHF, acute (HCC) Follow up with cardiology Patient is on Lasix  20 mg daily. PRN leg edema  Orders Placed This Encounter  Procedures   CMP (Cancer Center only)    Standing Status:   Future    Expected Date:   08/02/2024    Expiration Date:   10/31/2024   CMP (Cancer Center only)    Standing Status:   Future    Expected Date:   08/16/2024    Expiration Date:   11/14/2024   CBC with Differential (Cancer Center Only)    Standing Status:   Future    Expected Date:   08/16/2024    Expiration Date:   11/14/2024   CBC with Differential (Cancer Center Only)    Standing Status:   Future    Expected Date:   08/06/2024    Expiration Date:   11/04/2024   Sample to Blood Bank    Standing Status:   Future    Expected Date:   08/02/2024    Expiration Date:   10/31/2024   Sample to Blood Bank    Standing Status:  Future    Expected Date:   08/16/2024    Expiration Date:   11/14/2024   Follow-up 2 weeks   We spent sufficient time to discuss many aspect of care, questions were answered to patient's satisfaction.    Victor Cap, MD, PhD [facility] Hematology Oncology 08/02/2024     HISTORY OF PRESENTING ILLNESS:  Victor Victor is a  68 y.o.  male with PMH listed below who was referred to me for anemia  02/11/2023 - 02/14/2023 recent hospitalization due to pneumonia, respiratory failure.  He was found to have a hemoglobin decreased at 6.8, status post PRBC transfusion during admission.  EGD  showed gastritis.  Colonoscopy was not remarkable. 02/11/2023 TIBC 221 ferritin 107, iron saturation 16. Patient is currently taking fusion plus Vitamin B12 level in the 300s. His echo showed grade 2 diastolic CHF.  He denies recent chest pain on exertion, shortness of breath on minimal exertion, pre-syncopal episodes, or palpitations He had not noticed any recent bleeding such as epistaxis, hematuria or hematochezia.  He denies over the counter NSAID ingestion.  Oncology History  Multiple myeloma (HCC)  04/14/2023 Initial Diagnosis   Multiple myeloma   03/13/2020 multiple myeloma panel showed M protein 4.3, IgA lambda Free lamda Level 142,-light chain ratio 0.07 04/05/2023 bone marrow biopsy showed hypercellular bone marrow with plasma cell neoplasm.The bone marrow is hypercellular for age with prominent increase in plasma cells representing 65% of all cells in the aspirate associated with interstitial infiltrates and numerous variably sized aggregates in  the clot and biopsy sections.  The plasma cells display lambda light chain restriction consistent with plasma cell neoplasm.  Normal cytogenetics.  FISH studies pending.    04/14/2023 Cancer Staging   Staging form: Plasma Cell Myeloma and Plasma Cell Disorders, AJCC 8th Edition - Clinical stage from 04/14/2023: RISS Stage II (Beta-2 -microglobulin (mg/L): 8.4, Albumin (g/dL): 2.6, ISS: Stage III, High-risk cytogenetics: Absent, LDH: Normal) - Signed by Victor Zelphia, MD on 04/22/2023 Stage prefix: Initial diagnosis Beta 2 microglobulin range (mg/L): Greater than or equal to 5.5 Albumin range (g/dL): Less than 3.5 Cytogenetics: 1q addition, Other mutation   04/14/2023 Imaging   Skeletal survey 1. No lytic lesions or other intrinsic bony abnormality. 2. Borderline cardiomegaly with an interval decrease in size. 3. Diffuse atheromatous arterial calcifications including bilateral carotid artery calcifications, right greater than left   04/22/2023 -  11/11/2023 Chemotherapy   Patient is on Treatment Plan : MYELOMA NEWLY DIAGNOSED TRANSPLANT CANDIDATE DaraVRd (Daratumumab  SQ) q21d     04/25/2023 Imaging   PET scan showed  1. Two tiny hypermetabolic lucent lesions in the left posteromedial eighth and ninth ribs. No additional evidence of multiple myeloma. 2. Aortic atherosclerosis (ICD10-I70.0). Coronary artery calcification.   07/14/2023 - 07/19/2023 Hospital Admission   Hospitalized due to ileus.    07/25/2023 Bone Marrow Biopsy   A-C. Peripheral blood and bone marrow, (peripheral smear, aspirate smear, touch preparation, clot section, core biopsy):   Persistent plasma cell myeloma, 50-60% lambda restricted plasma cells on core biopsy. Hypercellular bone marrow (70%) with trilineage hematopoiesis. Negative for increased blasts. Negative for significant dysplasia. Negative for amyloid deposition.   01/25/2024 -  Chemotherapy   Patient is on Treatment Plan : MYELOMA RELAPSED/ REFRACTORY Carfilzomib  D1,8,15 (20/27) + selinexor   + Dexamethasone  (KPd) q28d       A Fib. on Eliquis  5mg  BID, follows up with cardiology.  Patient reports being compliant on Eliquis .  Denies any hematochezia or melena. Patient is off Lasix  20  mg for most days, only uses PRN leg edema.  Denies fever, chills, diarrhea, chest pain, nausea vomiting. 07/04/2024, patient started current cycle of Selinexor  to 40mg , with Pomalyst  2 mg day 1-21, and dexamethasone  20 mg weekly. He tolerated well, hemoglobin on day day 16 showed hemoglobin of 8.5. He is supposed to finish Pomalyst  on 07/25/2024. Apparently patient has continued on Pomalyst  2 mg daily day 21.  He denies dizziness, chest pain.  Feels tired.   MEDICAL HISTORY:  Past Medical History:  Diagnosis Date   Atrial fibrillation (HCC)    Diabetes mellitus without complication (HCC)    Hypercholesteremia    Hypertension    Multiple myeloma not having achieved remission (HCC)     SURGICAL HISTORY: Past  Surgical History:  Procedure Laterality Date   CARDIOVERSION N/A 10/17/2023   Procedure: CARDIOVERSION;  Surgeon: Victor Rogue, MD;  Location: [facility] ORS;  Service: Cardiovascular;  Laterality: N/A;   COLONOSCOPY N/A 02/13/2023   Procedure: COLONOSCOPY;  Surgeon: Toledo, Ladell POUR, MD;  Location: [facility] ENDOSCOPY;  Service: Gastroenterology;  Laterality: N/A;   COLONOSCOPY N/A 02/14/2023   Procedure: COLONOSCOPY;  Surgeon: Toledo, Ladell POUR, MD;  Location: [facility] ENDOSCOPY;  Service: Gastroenterology;  Laterality: N/A;   ESOPHAGOGASTRODUODENOSCOPY N/A 02/13/2023   Procedure: ESOPHAGOGASTRODUODENOSCOPY (EGD);  Surgeon: Toledo, Ladell POUR, MD;  Location: [facility] ENDOSCOPY;  Service: Gastroenterology;  Laterality: N/A;   IR BONE MARROW BIOPSY & ASPIRATION  11/24/2023    SOCIAL HISTORY: Social History   Socioeconomic History   Marital status: Married    Spouse name: Not on file   Number of children: Not on file   Years of education: Not on file   Highest education level: Not on file  Occupational History   Not on file  Tobacco Use   Smoking status: Never   Smokeless tobacco: Never  Vaping Use   Vaping status: Never Used  Substance and Sexual Activity   Alcohol use: Not Currently    Comment: quit approx 2001   Drug use: No   Sexual activity: Not on file  Other Topics Concern   Not on file  Social History Narrative   Not on file   Social Drivers of Health   Financial Resource Strain: Medium Risk (01/13/2024)   Overall Financial Resource Strain (CARDIA)    Difficulty of Paying Living Expenses: Somewhat hard  Food Insecurity: Food Insecurity Present (01/13/2024)   Hunger Vital Sign    Worried About Running Out of Food in the Last Year: Sometimes true    Ran Out of Food in the Last Year: Sometimes true  Transportation Needs: No Transportation Needs (01/13/2024)   PRAPARE - Administrator, Civil Service (Medical): No    Lack of Transportation (Non-Medical): No  Physical Activity:  Insufficiently Active (10/12/2023)   Received from [facility]   Exercise Vital Sign    On average, how many days per week do you engage in moderate to strenuous exercise (like a brisk walk)?: 7 days    On average, how many minutes do you engage in exercise at this level?: 10 min  Stress: No Stress Concern Present (10/12/2023)   Received from [facility] - Occupational Stress Questionnaire    Feeling of Stress : Not at all  Recent Concern: Stress - Stress Concern Present (10/04/2023)   Received from [facility] - Occupational Stress Questionnaire    Feeling of  Stress : Rather much  Social Connections: Moderately Integrated (10/12/2023)   Received from [facility]   Social Connection and Isolation Panel    In a typical week, how many times do you talk on the phone with family, friends, or neighbors?: More than three times a week    How often do you get together with friends or relatives?: More than three times a week    How often do you attend church or religious services?: 1 to 4 times per year    Do you belong to any clubs or organizations such as church groups, unions, fraternal or athletic groups, or school groups?: No    How often do you attend meetings of the clubs or organizations you belong to?: Never    Are you married, widowed, divorced, separated, never married, or living with a partner?: Married  Intimate Partner Violence: Not At Risk (07/14/2023)   Humiliation, Afraid, Rape, and Kick questionnaire    Fear of Current or Ex-Partner: No    Emotionally Abused: No    Physically Abused: No    Sexually Abused: No    FAMILY HISTORY: Family History  Problem Relation Age of Onset   Diabetes Mother    Heart attack Father     ALLERGIES:  has no known allergies.  MEDICATIONS:  Current Outpatient Medications  Medication Sig  Dispense Refill   acetaminophen  (TYLENOL ) 325 MG tablet Take 2 tablets (650 mg total) by mouth every 6 (six) hours as needed for mild pain (or Fever >/= 101). 90 tablet 1   acyclovir  (ZOVIRAX ) 400 MG tablet Take 1 tablet (400 mg total) by mouth 2 (two) times daily. 180 tablet 0   amiodarone  (PACERONE ) 200 MG tablet Take 1 tablet (200 mg total) by mouth daily. 90 tablet 2   apixaban  (ELIQUIS ) 5 MG TABS tablet Take 1 tablet (5 mg total) by mouth 2 (two) times daily. 60 tablet 5   atorvastatin  (LIPITOR) 10 MG tablet Take 10 mg by mouth daily.     calcium  carbonate (OS-CAL) 600 MG TABS tablet Take 2 tablets (1,200 mg total) by mouth daily.     cholecalciferol (VITAMIN D3) 25 MCG (1000 UNIT) tablet Take 1 tablet (1,000 Units total) by mouth daily.     cyanocobalamin  (VITAMIN B12) 1000 MCG tablet Take 1 tablet (1,000 mcg total) by mouth daily.     dexamethasone  (DECADRON ) 4 MG tablet Take 5 tablets (20 mg total) by mouth once a week. 20 tablet 5   empagliflozin  (JARDIANCE ) 10 MG TABS tablet Take 1 tablet (10 mg total) by mouth daily before breakfast. 30 tablet 2   loperamide  (IMODIUM ) 2 MG capsule Take 1 capsule (2 mg total) by mouth See admin instructions. With onset of diarrhea, take 4 mg,then 2 mg every 2 hours or after every loose bowel movements  maximum: 16 mg/day 60 capsule 2   metoprolol  succinate (TOPROL -XL) 25 MG 24 hr tablet Take 0.5 tablets (12.5 mg total) by mouth daily. 45 tablet 1   Multiple Vitamin (MULTIVITAMIN WITH MINERALS) TABS tablet Take 1 tablet by mouth daily. 90 tablet 1   pantoprazole  (PROTONIX ) 40 MG tablet Take 1 tablet (40 mg total) by mouth daily. 30 tablet 1   pomalidomide  (POMALYST ) 2 MG capsule Take 1 capsule (2 mg total) by mouth daily. Take for 21 days, then hold for 7 days. Repeat every 28 days. 21 capsule 0   Selinexor , 40 MG Once Weekly, (XPOVIO , 40 MG ONCE WEEKLY,) 10 MG TBPK Take 40  mg by mouth once a week. 16 each 1   temazepam (RESTORIL) 15 MG capsule Take 15 mg  by mouth at bedtime.     furosemide  (LASIX ) 20 MG tablet Take 1 tablet (20 mg total) by mouth daily. (Patient not taking: Reported on 08/02/2024)     glipiZIDE (GLUCOTROL XL) 2.5 MG 24 hr tablet Take 5 mg by mouth daily with breakfast. (Patient not taking: Reported on 08/02/2024)     ondansetron  (ZOFRAN ) 8 MG tablet Take 1 tablet (8 mg total) by mouth every 8 (eight) hours as needed for nausea or vomiting. (Patient not taking: Reported on 08/02/2024) 90 tablet 1   prochlorperazine  (COMPAZINE ) 10 MG tablet Take 1 tablet (10 mg total) by mouth every 6 (six) hours as needed for nausea or vomiting. (Patient not taking: Reported on 08/02/2024) 90 tablet 1   spironolactone  (ALDACTONE ) 25 MG tablet Take 0.5 tablets (12.5 mg total) by mouth daily. (Patient not taking: Reported on 08/02/2024) 45 tablet 1   No current facility-administered medications for this visit.    Review of Systems  Constitutional:  Positive for fatigue. Negative for appetite change, chills, fever and unexpected weight change.  HENT:   Negative for hearing loss and voice change.   Eyes:  Negative for eye problems and icterus.  Respiratory:  Negative for chest tightness, cough and shortness of breath.   Cardiovascular:  Negative for chest pain and leg swelling.  Gastrointestinal:  Negative for abdominal distention, abdominal pain and diarrhea.  Endocrine: Negative for hot flashes.  Genitourinary:  Negative for difficulty urinating, dysuria and frequency.   Musculoskeletal:  Negative for arthralgias.  Skin:  Negative for itching and rash.  Neurological:  Negative for light-headedness and numbness.  Hematological:  Negative for adenopathy. Does not bruise/bleed easily.  Psychiatric/Behavioral:  Positive for sleep disturbance. Negative for confusion.     PHYSICAL EXAMINATION: Vitals:   08/02/24 0856  BP: (!) 121/53  Pulse: 68  Resp: 18  Temp: (!) 96.9 F (36.1 C)  SpO2: 99%   Filed Weights   08/02/24 0856  Weight: 153  lb (69.4 kg)    Physical Exam Constitutional:      General: He is not in acute distress.    Appearance: He is obese.  HENT:     Head: Normocephalic and atraumatic.     Mouth/Throat:     Comments: Dry oral mucosa  Eyes:     General: No scleral icterus. Cardiovascular:     Rate and Rhythm: Normal rate.  Pulmonary:     Effort: Pulmonary effort is normal. No respiratory distress.     Breath sounds: No wheezing.  Abdominal:     General: Bowel sounds are normal. There is no distension.     Palpations: Abdomen is soft.  Musculoskeletal:        General: Normal range of motion.     Cervical back: Normal range of motion and neck supple.     Right lower leg: No edema.     Left lower leg: No edema.  Skin:    General: Skin is warm and dry.     Findings: No erythema or rash.  Neurological:     Mental Status: He is alert and oriented to person, place, and time. Mental status is at baseline.     Cranial Nerves: No cranial nerve deficit.  Psychiatric:        Mood and Affect: Mood normal.      LABORATORY DATA:  I have reviewed the data as listed  Latest Ref Rng & Units 08/02/2024    8:47 AM 07/19/2024   10:20 AM 07/11/2024    9:56 AM  CBC  WBC 4.0 - 10.5 K/uL 3.2  5.8  6.0   Hemoglobin 13.0 - 17.0 g/dL 6.4  8.5  8.1   Hematocrit 39.0 - 52.0 % 19.5  25.6  24.7   Platelets 150 - 400 K/uL 152  164  181       Latest Ref Rng & Units 08/02/2024    8:47 AM 07/19/2024   10:20 AM 07/11/2024    9:56 AM  CMP  Glucose 70 - 99 mg/dL 852  793  827   BUN 8 - 23 mg/dL 28  27  35   Creatinine 0.61 - 1.24 mg/dL 8.38  8.88  8.67   Sodium 135 - 145 mmol/L 134  133  133   Potassium 3.5 - 5.1 mmol/L 4.5  4.3  4.4   Chloride 98 - 111 mmol/L 104  106  105   CO2 22 - 32 mmol/L 20  20  21    Calcium  8.9 - 10.3 mg/dL 8.5  8.9  8.5   Total Protein 6.5 - 8.1 g/dL 9.8  9.1  9.3   Total Bilirubin 0.0 - 1.2 mg/dL 0.5  0.6  0.7   Alkaline Phos 38 - 126 U/L 128  129  114   AST 15 - 41 U/L 22  22  22     ALT 0 - 44 U/L 14  15  18     Lab Results  Component Value Date   IRON 70 03/28/2024   TIBC 304 03/28/2024   IRONPCTSAT 23 03/28/2024   FERRITIN 535 (H) 03/28/2024     RADIOGRAPHIC STUDIES: I have personally reviewed the radiological images as listed and agreed with the findings in the report. No results found.

## 2024-08-03 ENCOUNTER — Inpatient Hospital Stay

## 2024-08-03 ENCOUNTER — Other Ambulatory Visit: Payer: Self-pay

## 2024-08-03 DIAGNOSIS — Z5112 Encounter for antineoplastic immunotherapy: Secondary | ICD-10-CM | POA: Diagnosis not present

## 2024-08-03 DIAGNOSIS — C9000 Multiple myeloma not having achieved remission: Secondary | ICD-10-CM

## 2024-08-03 DIAGNOSIS — D649 Anemia, unspecified: Secondary | ICD-10-CM

## 2024-08-03 LAB — KAPPA/LAMBDA LIGHT CHAINS
Kappa free light chain: 9.6 mg/L (ref 3.3–19.4)
Kappa, lambda light chain ratio: 0.01 — ABNORMAL LOW (ref 0.26–1.65)
Lambda free light chains: 1150 mg/L — ABNORMAL HIGH (ref 5.7–26.3)

## 2024-08-03 MED ORDER — ACETAMINOPHEN 325 MG PO TABS
650.0000 mg | ORAL_TABLET | Freq: Once | ORAL | Status: AC
  Administered 2024-08-03: 650 mg via ORAL
  Filled 2024-08-03: qty 2

## 2024-08-03 MED ORDER — DIPHENHYDRAMINE HCL 25 MG PO TABS
25.0000 mg | ORAL_TABLET | Freq: Once | ORAL | Status: AC
  Administered 2024-08-03: 25 mg via ORAL
  Filled 2024-08-03: qty 1

## 2024-08-03 MED ORDER — SODIUM CHLORIDE 0.9% IV SOLUTION
250.0000 mL | INTRAVENOUS | Status: DC
  Filled 2024-08-03: qty 250

## 2024-08-04 LAB — BPAM RBC
Blood Product Expiration Date: 202512172359
ISSUE DATE / TIME: 202511210927
Unit Type and Rh: 5100

## 2024-08-04 LAB — TYPE AND SCREEN
ABO/RH(D): B POS
Antibody Screen: NEGATIVE
Unit division: 0

## 2024-08-06 ENCOUNTER — Inpatient Hospital Stay

## 2024-08-06 DIAGNOSIS — C9000 Multiple myeloma not having achieved remission: Secondary | ICD-10-CM

## 2024-08-06 DIAGNOSIS — Z5112 Encounter for antineoplastic immunotherapy: Secondary | ICD-10-CM | POA: Diagnosis not present

## 2024-08-06 DIAGNOSIS — D649 Anemia, unspecified: Secondary | ICD-10-CM

## 2024-08-06 LAB — MULTIPLE MYELOMA PANEL, SERUM
Albumin SerPl Elph-Mcnc: 3 g/dL (ref 2.9–4.4)
Albumin/Glob SerPl: 0.6 — ABNORMAL LOW (ref 0.7–1.7)
Alpha 1: 0.3 g/dL (ref 0.0–0.4)
Alpha2 Glob SerPl Elph-Mcnc: 0.9 g/dL (ref 0.4–1.0)
B-Globulin SerPl Elph-Mcnc: 1.1 g/dL (ref 0.7–1.3)
Gamma Glob SerPl Elph-Mcnc: 3.7 g/dL — ABNORMAL HIGH (ref 0.4–1.8)
Globulin, Total: 6 g/dL — ABNORMAL HIGH (ref 2.2–3.9)
IgA: 4364 mg/dL — ABNORMAL HIGH (ref 61–437)
IgG (Immunoglobin G), Serum: 417 mg/dL — ABNORMAL LOW (ref 603–1613)
IgM (Immunoglobulin M), Srm: 6 mg/dL — ABNORMAL LOW (ref 20–172)
M Protein SerPl Elph-Mcnc: 2.6 g/dL — ABNORMAL HIGH
Total Protein ELP: 9 g/dL — ABNORMAL HIGH (ref 6.0–8.5)

## 2024-08-06 LAB — CBC WITH DIFFERENTIAL (CANCER CENTER ONLY)
Abs Immature Granulocytes: 0.03 K/uL (ref 0.00–0.07)
Basophils Absolute: 0 K/uL (ref 0.0–0.1)
Basophils Relative: 0 %
Eosinophils Absolute: 0.2 K/uL (ref 0.0–0.5)
Eosinophils Relative: 7 %
HCT: 25.1 % — ABNORMAL LOW (ref 39.0–52.0)
Hemoglobin: 8.3 g/dL — ABNORMAL LOW (ref 13.0–17.0)
Immature Granulocytes: 1 %
Lymphocytes Relative: 26 %
Lymphs Abs: 0.6 K/uL — ABNORMAL LOW (ref 0.7–4.0)
MCH: 33.9 pg (ref 26.0–34.0)
MCHC: 33.1 g/dL (ref 30.0–36.0)
MCV: 102.4 fL — ABNORMAL HIGH (ref 80.0–100.0)
Monocytes Absolute: 0.4 K/uL (ref 0.1–1.0)
Monocytes Relative: 15 %
Neutro Abs: 1.2 K/uL — ABNORMAL LOW (ref 1.7–7.7)
Neutrophils Relative %: 51 %
Platelet Count: 125 K/uL — ABNORMAL LOW (ref 150–400)
RBC: 2.45 MIL/uL — ABNORMAL LOW (ref 4.22–5.81)
RDW: 16.7 % — ABNORMAL HIGH (ref 11.5–15.5)
WBC Count: 2.4 K/uL — ABNORMAL LOW (ref 4.0–10.5)
nRBC: 1.2 % — ABNORMAL HIGH (ref 0.0–0.2)

## 2024-08-06 LAB — CMP (CANCER CENTER ONLY)
ALT: 12 U/L (ref 0–44)
AST: 17 U/L (ref 15–41)
Albumin: 3 g/dL — ABNORMAL LOW (ref 3.5–5.0)
Alkaline Phosphatase: 118 U/L (ref 38–126)
Anion gap: 9 (ref 5–15)
BUN: 29 mg/dL — ABNORMAL HIGH (ref 8–23)
CO2: 21 mmol/L — ABNORMAL LOW (ref 22–32)
Calcium: 8.5 mg/dL — ABNORMAL LOW (ref 8.9–10.3)
Chloride: 104 mmol/L (ref 98–111)
Creatinine: 1.55 mg/dL — ABNORMAL HIGH (ref 0.61–1.24)
GFR, Estimated: 49 mL/min — ABNORMAL LOW (ref >60)
Glucose, Bld: 124 mg/dL — ABNORMAL HIGH (ref 70–99)
Potassium: 4.8 mmol/L (ref 3.5–5.1)
Sodium: 134 mmol/L — ABNORMAL LOW (ref 135–145)
Total Bilirubin: 0.9 mg/dL (ref 0.0–1.2)
Total Protein: 9.9 g/dL — ABNORMAL HIGH (ref 6.5–8.1)

## 2024-08-06 LAB — SAMPLE TO BLOOD BANK

## 2024-08-07 ENCOUNTER — Inpatient Hospital Stay

## 2024-08-11 ENCOUNTER — Other Ambulatory Visit: Payer: Self-pay | Admitting: Oncology

## 2024-08-11 DIAGNOSIS — C9000 Multiple myeloma not having achieved remission: Secondary | ICD-10-CM

## 2024-08-15 ENCOUNTER — Encounter: Payer: Self-pay | Admitting: Medical

## 2024-08-15 ENCOUNTER — Ambulatory Visit: Attending: Medical | Admitting: Medical

## 2024-08-15 VITALS — BP 132/62 | HR 73 | Ht 67.0 in | Wt 151.2 lb

## 2024-08-15 DIAGNOSIS — C9000 Multiple myeloma not having achieved remission: Secondary | ICD-10-CM

## 2024-08-15 DIAGNOSIS — I4819 Other persistent atrial fibrillation: Secondary | ICD-10-CM | POA: Diagnosis not present

## 2024-08-15 DIAGNOSIS — I5022 Chronic systolic (congestive) heart failure: Secondary | ICD-10-CM

## 2024-08-15 DIAGNOSIS — I10 Essential (primary) hypertension: Secondary | ICD-10-CM | POA: Diagnosis not present

## 2024-08-15 DIAGNOSIS — I5020 Unspecified systolic (congestive) heart failure: Secondary | ICD-10-CM

## 2024-08-15 MED ORDER — SACUBITRIL-VALSARTAN 24-26 MG PO TABS: 1.0000 | ORAL_TABLET | Freq: Two times a day (BID) | ORAL | 4 refills | Status: AC

## 2024-08-15 NOTE — Patient Instructions (Addendum)
 Medication Instructions:  START Entresto 24-26 mg twice daily  *If you need a refill on your cardiac medications before your next appointment, please call your pharmacy*  Lab Work: Your provider would like for you to return in 2 weeks to have the following labs drawn: BMET.   Please go to [facility] [address] ([facility]) [address], [place] [address] You do not need an appointment.  They are open from 8 am- 4:30 pm.  Lunch from 1:00 pm- 2:00 pm You will not need to be fasting.   You may also go to one of the following [facility]:  [address] [place], [place] [address] Phone: [phone] Lab hours: Mon-Fri 8 am- 5 pm    Lunch 12 pm- 1 pm  [address] [place],  [place]  [address]  [place]  Phone: [phone] Lab hours: 7 am- 4 pm Lunch 12 pm-1 pm   [address] [place],  [place]  [address]  [place]  Phone: [phone] Lab hours: Mon-Fri 8 am- 5 pm    Lunch 12 pm- 1 pm  If you have labs (blood work) drawn today and your tests are completely normal, you will receive your results only by: MyChart Message (if you have MyChart) OR A paper copy in the mail If you have any lab test that is abnormal or we need to change your treatment, we will call you to review the results.  Testing/Procedures: Your physician has requested that you have an limited echocardiogram in 2 months. Echocardiography is a painless test that uses sound waves to create images of your heart. It provides your doctor with information about the size and shape of your heart and how well your heart's chambers and valves are working.   You may receive an ultrasound enhancing agent through an IV if needed to better visualize your heart during the echo. This procedure takes approximately one hour.  There are no restrictions for this procedure.  This will take place at [address] [facility]) [address], [place] [address]  Please note: We ask at that you not bring children with you  during ultrasound (echo/ vascular) testing. Due to room size and safety concerns, children are not allowed in the ultrasound rooms during exams. Our front office staff cannot provide observation of children in our lobby area while testing is being conducted. An adult accompanying a patient to their appointment will only be allowed in the ultrasound room at the discretion of the ultrasound technician under special circumstances. We apologize for any inconvenience.   Follow-Up: At [facility], you and your health needs are our priority.  As part of our continuing mission to provide you with exceptional heart care, our providers are all part of one team.  This team includes your primary Cardiologist (physician) and Advanced Practice Providers or APPs (Physician Assistants and Nurse Practitioners) who all work together to provide you with the care you need, when you need it.  Your next appointment:   3 month(s)  Provider:   Cadence Franchester, PA-C    We recommend signing up for the patient portal called MyChart.  Sign up information is provided on this After Visit Summary.  MyChart is used to connect with patients for Virtual Visits (Telemedicine).  Patients are able to view lab/test results, encounter notes, upcoming appointments, etc.  Non-urgent messages can be sent to your provider as well.   To learn more about what you can do with MyChart, go to [email or web address].

## 2024-08-15 NOTE — Progress Notes (Signed)
 Cardiology Office Note   Date:  08/15/2024  ID:  SION REINDERS, DOB 1955/09/22, MRN [id number] PCP: Cleotilde Oneil FALCON, MD  [facility] Providers Cardiologist:  Redell Cave, MD Cardiology APP:  [place] Mikey DEL, PA-C  Electrophysiologist:  Fonda Kitty, MD  Electrophysiology APP:  Riddle, Suzann, NP   History of Present Illness Victor Phillips is a 68 y.o. male with a history of persistent atrial fibrillation, hypertension, hyperlipidemia, diabetes, multiple myeloma, HFrEF who presents for follow-up Afib and HFrEF.   Echocardiogram in 2024 showed EF of 50 to 55%.  Cardiac MRI in November 2024 showed EF of 50%, no LGE or LV infiltration.   Patient has a history of A-fib with cardioversion in February 2025 that was successful.  At follow-up he had return to A-fib.  Patient was asymptomatic and continued on rate controlling medication.  EP did consider Tikosyn. Not a good candidate for Amiodarone  given MM. Since his rates were OK, no changes were made.    Patient was seen 04/20/2024 reporting dizziness when he stands up.  Orthostatics were negative.  Patient was volume overloaded with swelling into the thighs and JVD suspected secondary to A-fib.  He was in A-fib with heart rate of 109 bpm.  He was started on Lasix  40 mg daily.  Echocardiogram was ordered.  He was referred back to EP to consider AA options.  He was subsequently started on amiodarone .  Echocardiogram showed reduced pump function of 30 to 35%, moderately reduced RV SF, mild MR, moderate TR. He was started on GDMT with plan to restore NSR and re-check echo in a few months.  The patient was last seen 10/21/2 by EP due to persistent Afib. He was in NSR on amiodarone . He was not felt to be a good ablation candidate.   Today, patient reports he is overall dong well. He remains in NSR. He denies chest pain. He has SOB when he walks long distances. He is currently on abx and prednisone for URI. He denies lower leg edema,  lightheadedness, dizziness, palpitations, heart racing. He takes lasix  as needed.   Studies Reviewed      Echo 05/2024 1. Left ventricular ejection fraction, by estimation, is 30 to 35%. The  left ventricle has moderately decreased function. The left ventricle  demonstrates global hypokinesis. The left ventricular internal cavity size  was moderately dilated. Left  ventricular diastolic parameters are indeterminate.   2. Right ventricular systolic function is moderately reduced. The right  ventricular size is normal. There is severely elevated pulmonary artery  systolic pressure. The estimated right ventricular systolic pressure is  73.1 mmHg.   3. Left atrial size was mildly dilated.   4. Right atrial size was mildly dilated.   5. The mitral valve is normal in structure. Mild mitral valve  regurgitation. No evidence of mitral stenosis.   6. Tricuspid valve regurgitation is moderate.   7. The aortic valve is normal in structure. Aortic valve regurgitation is  not visualized. No aortic stenosis is present.   8. The inferior vena cava is dilated in size with <50% respiratory  variability, suggesting right atrial pressure of 15 mmHg.    cMRI 07/2023  IMPRESSION: 1.  Normal LV size, low normal systolic function.  LVEF 50%   2.  No LV LGE or scar.   3.  Normal ECV, no evidence for infiltrative disease.   4.  No significant valvular abnormalities.   5.  Normal RV systolic function.   6.  No  evidence for cardiac amyloidosis.   Echo 06/2023 1. Left ventricular ejection fraction, by estimation, is 50 to 55%. The  left ventricle has low normal function. The left ventricle has no regional  wall motion abnormalities. There is mild left ventricular hypertrophy.  Left ventricular diastolic function   could not be evaluated.   2. Right ventricular systolic function is normal. The right ventricular  size is normal. There is moderately elevated pulmonary artery systolic  pressure.   3.  Right atrial size was mildly dilated.   4. The mitral valve is normal in structure. Mild mitral valve  regurgitation.   5. The aortic valve is tricuspid. Aortic valve regurgitation is not  visualized. No aortic stenosis is present.   6. The inferior vena cava is normal in size with <50% respiratory  variability, suggesting right atrial pressure of 8 mmHg.     Physical Exam VS:  There were no vitals taken for this visit.       Wt Readings from Last 3 Encounters:  08/02/24 153 lb (69.4 kg)  07/19/24 153 lb 14.4 oz (69.8 kg)  07/11/24 153 lb 4.8 oz (69.5 kg)    GEN: Well nourished, well developed in no acute distress NECK: No JVD; No carotid bruits CARDIAC: RRR, no murmurs, rubs, gallops RESPIRATORY:  Clear to auscultation without rales, wheezing or rhonchi  ABDOMEN: Soft, non-tender, non-distended EXTREMITIES:  No edema; No deformity   ASSESSMENT AND PLAN  Persistent Afib Patient converted back to normal sinus rhythm on amiodarone .  He has been following with Dr. Kennyth.  EKG today shows normal sinus rhythm.  Continue amiodarone  200 mg daily and Toprol  12.5 mg daily.  Per EP patient is not a good ablation candidate.  Patient has chronically low hemoglobin in the setting of multiple myeloma and Eliquis  use.  Hemoglobin down to 6.4 last month, and he received a transfusion at the cancer center.  Most recent hemoglobin 8.3.  Continue to monitor CBC.  Continue Eliquis  5 mg twice daily.  HFrEF Echo in September showed LVEF of 30 to 35% in the setting of A-fib, however cannot exclude ischemia.  Will continue Toprol  12.5 mg daily, spironolactone  12.5 mg daily, Jardiance  10 mg daily.  I will start Entresto 24-26 mg twice daily.  BMP in 2 weeks.  Repeat limited echo in 2 months.  If EF is still low, may consider ischemic evaluation.  Multiple myeloma He is followed by the cancer center.  HTN BP is normal, continue current medications.     Dispo: Follow-up in 3 months  Signed, Bartolo Montanye VEAR Fishman, PA-C

## 2024-08-16 ENCOUNTER — Encounter: Payer: Self-pay | Admitting: Oncology

## 2024-08-16 ENCOUNTER — Inpatient Hospital Stay: Admitting: Oncology

## 2024-08-16 ENCOUNTER — Inpatient Hospital Stay: Admitting: Pharmacist

## 2024-08-16 ENCOUNTER — Inpatient Hospital Stay: Attending: Oncology

## 2024-08-16 ENCOUNTER — Other Ambulatory Visit: Payer: Self-pay

## 2024-08-16 VITALS — BP 150/72 | HR 68 | Temp 96.5°F | Resp 18 | Wt 153.0 lb

## 2024-08-16 DIAGNOSIS — I5031 Acute diastolic (congestive) heart failure: Secondary | ICD-10-CM | POA: Diagnosis not present

## 2024-08-16 DIAGNOSIS — I5030 Unspecified diastolic (congestive) heart failure: Secondary | ICD-10-CM | POA: Diagnosis not present

## 2024-08-16 DIAGNOSIS — Z7901 Long term (current) use of anticoagulants: Secondary | ICD-10-CM | POA: Diagnosis not present

## 2024-08-16 DIAGNOSIS — N1832 Chronic kidney disease, stage 3b: Secondary | ICD-10-CM | POA: Diagnosis not present

## 2024-08-16 DIAGNOSIS — N179 Acute kidney failure, unspecified: Secondary | ICD-10-CM | POA: Diagnosis not present

## 2024-08-16 DIAGNOSIS — C9002 Multiple myeloma in relapse: Secondary | ICD-10-CM | POA: Diagnosis present

## 2024-08-16 DIAGNOSIS — D6481 Anemia due to antineoplastic chemotherapy: Secondary | ICD-10-CM | POA: Insufficient documentation

## 2024-08-16 DIAGNOSIS — C9000 Multiple myeloma not having achieved remission: Secondary | ICD-10-CM

## 2024-08-16 DIAGNOSIS — N183 Chronic kidney disease, stage 3 unspecified: Secondary | ICD-10-CM | POA: Diagnosis not present

## 2024-08-16 DIAGNOSIS — Z79899 Other long term (current) drug therapy: Secondary | ICD-10-CM | POA: Diagnosis not present

## 2024-08-16 DIAGNOSIS — T451X5A Adverse effect of antineoplastic and immunosuppressive drugs, initial encounter: Secondary | ICD-10-CM | POA: Insufficient documentation

## 2024-08-16 LAB — CBC WITH DIFFERENTIAL (CANCER CENTER ONLY)
Abs Immature Granulocytes: 0.24 K/uL — ABNORMAL HIGH (ref 0.00–0.07)
Basophils Absolute: 0.1 K/uL (ref 0.0–0.1)
Basophils Relative: 1 %
Eosinophils Absolute: 0.1 K/uL (ref 0.0–0.5)
Eosinophils Relative: 4 %
HCT: 24.4 % — ABNORMAL LOW (ref 39.0–52.0)
Hemoglobin: 7.9 g/dL — ABNORMAL LOW (ref 13.0–17.0)
Immature Granulocytes: 7 %
Lymphocytes Relative: 14 %
Lymphs Abs: 0.5 K/uL — ABNORMAL LOW (ref 0.7–4.0)
MCH: 33.9 pg (ref 26.0–34.0)
MCHC: 32.4 g/dL (ref 30.0–36.0)
MCV: 104.7 fL — ABNORMAL HIGH (ref 80.0–100.0)
Monocytes Absolute: 0.3 K/uL (ref 0.1–1.0)
Monocytes Relative: 8 %
Neutro Abs: 2.4 K/uL (ref 1.7–7.7)
Neutrophils Relative %: 66 %
Platelet Count: 184 K/uL (ref 150–400)
RBC: 2.33 MIL/uL — ABNORMAL LOW (ref 4.22–5.81)
RDW: 15.6 % — ABNORMAL HIGH (ref 11.5–15.5)
Smear Review: NORMAL
WBC Count: 3.7 K/uL — ABNORMAL LOW (ref 4.0–10.5)
nRBC: 0.5 % — ABNORMAL HIGH (ref 0.0–0.2)

## 2024-08-16 LAB — SAMPLE TO BLOOD BANK

## 2024-08-16 LAB — CMP (CANCER CENTER ONLY)
ALT: 15 U/L (ref 0–44)
AST: 36 U/L (ref 15–41)
Albumin: 3.6 g/dL (ref 3.5–5.0)
Alkaline Phosphatase: 141 U/L — ABNORMAL HIGH (ref 38–126)
Anion gap: 14 (ref 5–15)
BUN: 35 mg/dL — ABNORMAL HIGH (ref 8–23)
CO2: 22 mmol/L (ref 22–32)
Calcium: 9.6 mg/dL (ref 8.9–10.3)
Chloride: 101 mmol/L (ref 98–111)
Creatinine: 1.78 mg/dL — ABNORMAL HIGH (ref 0.61–1.24)
GFR, Estimated: 42 mL/min — ABNORMAL LOW (ref >60)
Glucose, Bld: 179 mg/dL — ABNORMAL HIGH (ref 70–99)
Potassium: 4.7 mmol/L (ref 3.5–5.1)
Sodium: 137 mmol/L (ref 135–145)
Total Bilirubin: 0.3 mg/dL (ref 0.0–1.2)
Total Protein: 10.8 g/dL — ABNORMAL HIGH (ref 6.5–8.1)

## 2024-08-16 NOTE — Progress Notes (Unsigned)
 Hematology/Oncology Progress note [phone] [phone]         Patient Care Team: Cleotilde Oneil FALCON, MD as PCP - General (Internal Medicine) Darliss Rogue, MD as PCP - Cardiology (Cardiology) Kennyth Chew, MD as PCP - Electrophysiology (Clinical Cardiac Electrophysiology) Babara Call, MD as Consulting Physician (Oncology) Riddle, Suzann, NP as Nurse Practitioner (Clinical Cardiac Electrophysiology) Franchester Mikey DEL, PA-C as Physician Assistant (Cardiology)  CHIEF COMPLAINTS/REASON FOR VISIT:  Multiple myeloma   ASSESSMENT & PLAN:   Cancer Staging  Multiple myeloma [facility]) Staging form: Plasma Cell Myeloma and Plasma Cell Disorders, AJCC 8th Edition - Clinical stage from 04/14/2023: RISS Stage II (Beta-2 -microglobulin (mg/L): 8.4, Albumin (g/dL): 2.6, ISS: Stage III, High-risk cytogenetics: Absent, LDH: Normal) - Signed by Babara Call, MD on 04/22/2023   No problem-specific Assessment & Plan notes found for this encounter.   Orders Placed This Encounter  Procedures   CBC with Differential (Cancer Center Only)    Standing Status:   Future    Expected Date:   08/22/2024    Expiration Date:   11/20/2024   CMP (Cancer Center only)    Standing Status:   Future    Expected Date:   08/22/2024    Expiration Date:   11/20/2024   Multiple Myeloma Panel (SPEP&IFE w/QIG)    Standing Status:   Future    Expected Date:   08/22/2024    Expiration Date:   11/20/2024   Kappa/lambda light chains    Standing Status:   Future    Expected Date:   08/22/2024    Expiration Date:   11/20/2024   Sample to Blood Bank    Standing Status:   Future    Expected Date:   08/22/2024    Expiration Date:   11/20/2024   Follow-up 2 weeks   We spent sufficient time to discuss many aspect of care, questions were answered to patient's satisfaction.    Call Babara, MD, PhD [facility] Hematology Oncology 08/16/2024     HISTORY OF PRESENTING ILLNESS:  Victor Phillips is a  68 y.o.  male  with PMH listed below who was referred to me for anemia  02/11/2023 - 02/14/2023 recent hospitalization due to pneumonia, respiratory failure.  He was found to have a hemoglobin decreased at 6.8, status post PRBC transfusion during admission.  EGD showed gastritis.  Colonoscopy was not remarkable. 02/11/2023 TIBC 221 ferritin 107, iron saturation 16. Patient is currently taking fusion plus Vitamin B12 level in the 300s. His echo showed grade 2 diastolic CHF.  He denies recent chest pain on exertion, shortness of breath on minimal exertion, pre-syncopal episodes, or palpitations He had not noticed any recent bleeding such as epistaxis, hematuria or hematochezia.  He denies over the counter NSAID ingestion.  Oncology History  Multiple myeloma (HCC)  04/14/2023 Initial Diagnosis   Multiple myeloma   03/13/2020 multiple myeloma panel showed M protein 4.3, IgA lambda Free lamda Level 142,-light chain ratio 0.07 04/05/2023 bone marrow biopsy showed hypercellular bone marrow with plasma cell neoplasm.The bone marrow is hypercellular for age with prominent increase in plasma cells representing 65% of all cells in the aspirate associated with interstitial infiltrates and numerous variably sized aggregates in  the clot and biopsy sections.  The plasma cells display lambda light chain restriction consistent with plasma cell neoplasm.  Normal cytogenetics.  FISH studies pending.    04/14/2023 Cancer Staging   Staging form: Plasma Cell Myeloma and Plasma Cell Disorders, AJCC 8th Edition - Clinical stage  from 04/14/2023: RISS Stage II (Beta-2 -microglobulin (mg/L): 8.4, Albumin (g/dL): 2.6, ISS: Stage III, High-risk cytogenetics: Absent, LDH: Normal) - Signed by Babara Call, MD on 04/22/2023 Stage prefix: Initial diagnosis Beta 2 microglobulin range (mg/L): Greater than or equal to 5.5 Albumin range (g/dL): Less than 3.5 Cytogenetics: 1q addition, Other mutation   04/14/2023 Imaging   Skeletal survey 1. No lytic  lesions or other intrinsic bony abnormality. 2. Borderline cardiomegaly with an interval decrease in size. 3. Diffuse atheromatous arterial calcifications including bilateral carotid artery calcifications, right greater than left   04/22/2023 - 11/11/2023 Chemotherapy   Patient is on Treatment Plan : MYELOMA NEWLY DIAGNOSED TRANSPLANT CANDIDATE DaraVRd (Daratumumab  SQ) q21d     04/25/2023 Imaging   PET scan showed  1. Two tiny hypermetabolic lucent lesions in the left posteromedial eighth and ninth ribs. No additional evidence of multiple myeloma. 2. Aortic atherosclerosis (ICD10-I70.0). Coronary artery calcification.   07/14/2023 - 07/19/2023 Hospital Admission   Hospitalized due to ileus.    07/25/2023 Bone Marrow Biopsy   A-C. Peripheral blood and bone marrow, (peripheral smear, aspirate smear, touch preparation, clot section, core biopsy):   Persistent plasma cell myeloma, 50-60% lambda restricted plasma cells on core biopsy. Hypercellular bone marrow (70%) with trilineage hematopoiesis. Negative for increased blasts. Negative for significant dysplasia. Negative for amyloid deposition.   01/25/2024 - 03/28/2024 Chemotherapy   Patient is on Treatment Plan : MYELOMA RELAPSED/ REFRACTORY Carfilzomib  D1,8,15 (20/27) + selinexor   + Dexamethasone  (KPd) q28d       A Fib. on Eliquis  5mg  BID, follows up with cardiology.  Patient reports being compliant on Eliquis .  Denies any hematochezia or melena. Patient is off Lasix  20 mg for most days, only uses PRN leg edema.  Denies fever, chills, diarrhea, chest pain, nausea vomiting. 07/04/2024, patient started current cycle of Selinexor  to 40mg , with Pomalyst  2 mg day 1-21, and dexamethasone  20 mg weekly. He tolerated well, hemoglobin on day day 16 showed hemoglobin of 8.5. He is supposed to finish Pomalyst  on 07/25/2024. Apparently patient has continued on Pomalyst  2 mg daily day 21.  He denies dizziness, chest pain.  Feels tired.   MEDICAL  HISTORY:  Past Medical History:  Diagnosis Date   Atrial fibrillation (HCC)    Diabetes mellitus without complication (HCC)    Hypercholesteremia    Hypertension    Multiple myeloma not having achieved remission (HCC)     SURGICAL HISTORY: Past Surgical History:  Procedure Laterality Date   CARDIOVERSION N/A 10/17/2023   Procedure: CARDIOVERSION;  Surgeon: Darliss Rogue, MD;  Location: [facility] ORS;  Service: Cardiovascular;  Laterality: N/A;   COLONOSCOPY N/A 02/13/2023   Procedure: COLONOSCOPY;  Surgeon: Toledo, Ladell POUR, MD;  Location: [facility] ENDOSCOPY;  Service: Gastroenterology;  Laterality: N/A;   COLONOSCOPY N/A 02/14/2023   Procedure: COLONOSCOPY;  Surgeon: Toledo, Ladell POUR, MD;  Location: [facility] ENDOSCOPY;  Service: Gastroenterology;  Laterality: N/A;   ESOPHAGOGASTRODUODENOSCOPY N/A 02/13/2023   Procedure: ESOPHAGOGASTRODUODENOSCOPY (EGD);  Surgeon: Toledo, Ladell POUR, MD;  Location: [facility] ENDOSCOPY;  Service: Gastroenterology;  Laterality: N/A;   IR BONE MARROW BIOPSY & ASPIRATION  11/24/2023    SOCIAL HISTORY: Social History   Socioeconomic History   Marital status: Married    Spouse name: Not on file   Number of children: Not on file   Years of education: Not on file   Highest education level: Not on file  Occupational History   Not on file  Tobacco Use   Smoking status: Never   Smokeless tobacco:  Never  Vaping Use   Vaping status: Never Used  Substance and Sexual Activity   Alcohol use: Not Currently    Comment: quit approx 2001   Drug use: No   Sexual activity: Not on file  Other Topics Concern   Not on file  Social History Narrative   Not on file   Social Drivers of Health   Financial Resource Strain: Low Risk  (08/14/2024)   Received from [facility]   Overall Financial Resource Strain (CARDIA)    Difficulty of Paying Living Expenses: Not hard at all  Food Insecurity: No Food Insecurity (08/14/2024)   [facility]   Hunger Vital Sign    Within the past 12 months, you worried that your food would run out before you got the money to buy more.: Never true    Within the past 12 months, the food you bought just didn't last and you didn't have money to get more.: Never true  Transportation Needs: No Transportation Needs (08/14/2024)   Received from [facility] - Transportation    In the past 12 months, has lack of transportation kept you from medical appointments or from getting medications?: No    Lack of Transportation (Non-Medical): No  Physical Activity: Insufficiently Active (10/12/2023)   Received from [facility]   Exercise Vital Sign    On average, how many days per week do you engage in moderate to strenuous exercise (like a brisk walk)?: 7 days    On average, how many minutes do you engage in exercise at this level?: 10 min  Stress: No Stress Concern Present (10/12/2023)   Received from [facility] - Occupational Stress Questionnaire    Feeling of Stress : Not at all  Recent Concern: Stress - Stress Concern Present (10/04/2023)   Received from [facility] of [facility]ational [facility] - Occupational Stress Questionnaire    Feeling of Stress : Rather much  Social Connections: Moderately Integrated (10/12/2023)   Received from [facility]   Social Connection and Isolation Panel    In a typical week, how many times do you talk on the phone with family, friends, or neighbors?: More than three times a week    How often do you get together with friends or relatives?: More than three times a week    How often do you attend church or religious services?: 1 to 4 times per year    Do you belong to any clubs or organizations such as church groups, unions, fraternal or athletic groups, or school groups?: No    How often do you attend meetings of the  clubs or organizations you belong to?: Never    Are you married, widowed, divorced, separated, never married, or living with a partner?: Married  Intimate Partner Violence: Not At Risk (07/14/2023)   Humiliation, Afraid, Rape, and Kick questionnaire    Fear of Current or Ex-Partner: No    Emotionally Abused: No    Physically Abused: No    Sexually Abused: No    FAMILY HISTORY: Family History  Problem Relation Age of Onset   Diabetes Mother    Heart attack Father     ALLERGIES:  has no known allergies.  MEDICATIONS:  Current Outpatient Medications  Medication Sig Dispense Refill   acetaminophen  (TYLENOL ) 325 MG tablet Take 2 tablets (650 mg  total) by mouth every 6 (six) hours as needed for mild pain (or Fever >/= 101). 90 tablet 1   acyclovir  (ZOVIRAX ) 400 MG tablet Take 1 tablet (400 mg total) by mouth 2 (two) times daily. 180 tablet 0   amiodarone  (PACERONE ) 200 MG tablet Take 1 tablet (200 mg total) by mouth daily. 90 tablet 2   amoxicillin  (AMOXIL ) 875 MG tablet Take 875 mg by mouth.     apixaban  (ELIQUIS ) 5 MG TABS tablet Take 1 tablet (5 mg total) by mouth 2 (two) times daily. 60 tablet 5   atorvastatin  (LIPITOR) 10 MG tablet Take 10 mg by mouth daily.     calcium  carbonate (OS-CAL) 600 MG TABS tablet Take 2 tablets (1,200 mg total) by mouth daily.     cholecalciferol (VITAMIN D3) 25 MCG (1000 UNIT) tablet Take 1 tablet (1,000 Units total) by mouth daily.     cyanocobalamin  (VITAMIN B12) 1000 MCG tablet Take 1 tablet (1,000 mcg total) by mouth daily.     empagliflozin  (JARDIANCE ) 10 MG TABS tablet Take 1 tablet (10 mg total) by mouth daily before breakfast. 30 tablet 2   furosemide  (LASIX ) 20 MG tablet Take 1 tablet (20 mg total) by mouth daily.     glipiZIDE (GLUCOTROL XL) 2.5 MG 24 hr tablet Take 5 mg by mouth daily with breakfast.     loperamide  (IMODIUM ) 2 MG capsule Take 1 capsule (2 mg total) by mouth See admin instructions. With onset of diarrhea, take 4 mg,then 2 mg  every 2 hours or after every loose bowel movements  maximum: 16 mg/day 60 capsule 2   metoprolol  succinate (TOPROL -XL) 25 MG 24 hr tablet Take 0.5 tablets (12.5 mg total) by mouth daily. 45 tablet 1   Multiple Vitamin (MULTIVITAMIN WITH MINERALS) TABS tablet Take 1 tablet by mouth daily. 90 tablet 1   ondansetron  (ZOFRAN ) 8 MG tablet Take 1 tablet (8 mg total) by mouth every 8 (eight) hours as needed for nausea or vomiting. 90 tablet 1   pantoprazole  (PROTONIX ) 40 MG tablet Take 1 tablet (40 mg total) by mouth daily. 30 tablet 1   pomalidomide  (POMALYST ) 2 MG capsule Take 1 capsule (2 mg total) by mouth daily. Take for 21 days, then hold for 7 days. Repeat every 28 days. 21 capsule 0   predniSONE (DELTASONE) 10 MG tablet Take 10 mg by mouth daily.     prochlorperazine  (COMPAZINE ) 10 MG tablet Take 1 tablet (10 mg total) by mouth every 6 (six) hours as needed for nausea or vomiting. 90 tablet 1   sacubitril-valsartan (ENTRESTO) 24-26 MG Take 1 tablet by mouth 2 (two) times daily. 60 tablet 4   Selinexor , 40 MG Once Weekly, (XPOVIO , 40 MG ONCE WEEKLY,) 10 MG TBPK Take 40 mg by mouth once a week. 16 each 1   spironolactone  (ALDACTONE ) 25 MG tablet Take 0.5 tablets (12.5 mg total) by mouth daily. 45 tablet 1   temazepam (RESTORIL) 15 MG capsule Take 15 mg by mouth at bedtime.     No current facility-administered medications for this visit.    Review of Systems  Constitutional:  Positive for fatigue. Negative for appetite change, chills, fever and unexpected weight change.  HENT:   Negative for hearing loss and voice change.   Eyes:  Negative for eye problems and icterus.  Respiratory:  Negative for chest tightness, cough and shortness of breath.   Cardiovascular:  Negative for chest pain and leg swelling.  Gastrointestinal:  Negative for abdominal distention, abdominal  pain and diarrhea.  Endocrine: Negative for hot flashes.  Genitourinary:  Negative for difficulty urinating, dysuria and  frequency.   Musculoskeletal:  Negative for arthralgias.  Skin:  Negative for itching and rash.  Neurological:  Negative for light-headedness and numbness.  Hematological:  Negative for adenopathy. Does not bruise/bleed easily.  Psychiatric/Behavioral:  Positive for sleep disturbance. Negative for confusion.     PHYSICAL EXAMINATION: Vitals:   08/16/24 0905 08/16/24 0916  BP: (!) 146/73 (!) 150/72  Pulse: 68   Resp: 18   Temp: (!) 96.5 F (35.8 C)   SpO2: 100%    Filed Weights   08/16/24 0905  Weight: 153 lb (69.4 kg)    Physical Exam Constitutional:      General: He is not in acute distress.    Appearance: He is obese.  HENT:     Head: Normocephalic and atraumatic.     Mouth/Throat:     Comments: Dry oral mucosa  Eyes:     General: No scleral icterus. Cardiovascular:     Rate and Rhythm: Normal rate.  Pulmonary:     Effort: Pulmonary effort is normal. No respiratory distress.     Breath sounds: No wheezing.  Abdominal:     General: Bowel sounds are normal. There is no distension.     Palpations: Abdomen is soft.  Musculoskeletal:        General: Normal range of motion.     Cervical back: Normal range of motion and neck supple.     Right lower leg: No edema.     Left lower leg: No edema.  Skin:    General: Skin is warm and dry.     Findings: No erythema or rash.  Neurological:     Mental Status: He is alert and oriented to person, place, and time. Mental status is at baseline.     Cranial Nerves: No cranial nerve deficit.  Psychiatric:        Mood and Affect: Mood normal.      LABORATORY DATA:  I have reviewed the data as listed    Latest Ref Rng & Units 08/16/2024    8:46 AM 08/06/2024   10:43 AM 08/02/2024    8:47 AM  CBC  WBC 4.0 - 10.5 K/uL 3.7  2.4  3.2   Hemoglobin 13.0 - 17.0 g/dL 7.9  8.3  6.4   Hematocrit 39.0 - 52.0 % 24.4  25.1  19.5   Platelets 150 - 400 K/uL 184  125  152       Latest Ref Rng & Units 08/16/2024    8:46 AM 08/06/2024    10:43 AM 08/02/2024    8:47 AM  CMP  Glucose 70 - 99 mg/dL 820  875  852   BUN 8 - 23 mg/dL 35  29  28   Creatinine 0.61 - 1.24 mg/dL 8.21  8.44  8.38   Sodium 135 - 145 mmol/L 137  134  134   Potassium 3.5 - 5.1 mmol/L 4.7  4.8  4.5   Chloride 98 - 111 mmol/L 101  104  104   CO2 22 - 32 mmol/L 22  21  20    Calcium  8.9 - 10.3 mg/dL 9.6  8.5  8.5   Total Protein 6.5 - 8.1 g/dL 89.1  9.9  9.8   Total Bilirubin 0.0 - 1.2 mg/dL 0.3  0.9  0.5   Alkaline Phos 38 - 126 U/L 141  118  128   AST 15 -  41 U/L 36  17  22   ALT 0 - 44 U/L 15  12  14     Lab Results  Component Value Date   IRON 70 03/28/2024   TIBC 304 03/28/2024   IRONPCTSAT 23 03/28/2024   FERRITIN 535 (H) 03/28/2024     RADIOGRAPHIC STUDIES: I have personally reviewed the radiological images as listed and agreed with the findings in the report. No results found.

## 2024-08-17 ENCOUNTER — Inpatient Hospital Stay

## 2024-08-20 ENCOUNTER — Encounter: Payer: Self-pay | Admitting: Oncology

## 2024-08-21 ENCOUNTER — Inpatient Hospital Stay

## 2024-08-22 ENCOUNTER — Telehealth: Payer: Self-pay

## 2024-08-22 ENCOUNTER — Inpatient Hospital Stay

## 2024-08-22 ENCOUNTER — Telehealth: Payer: Self-pay | Admitting: Oncology

## 2024-08-22 DIAGNOSIS — C9002 Multiple myeloma in relapse: Secondary | ICD-10-CM | POA: Diagnosis not present

## 2024-08-22 DIAGNOSIS — C9000 Multiple myeloma not having achieved remission: Secondary | ICD-10-CM

## 2024-08-22 LAB — CBC WITH DIFFERENTIAL (CANCER CENTER ONLY)
Abs Immature Granulocytes: 0.57 K/uL — ABNORMAL HIGH (ref 0.00–0.07)
Basophils Absolute: 0.1 K/uL (ref 0.0–0.1)
Basophils Relative: 1 %
Eosinophils Absolute: 0.5 K/uL (ref 0.0–0.5)
Eosinophils Relative: 8 %
HCT: 26.3 % — ABNORMAL LOW (ref 39.0–52.0)
Hemoglobin: 8.4 g/dL — ABNORMAL LOW (ref 13.0–17.0)
Immature Granulocytes: 10 %
Lymphocytes Relative: 13 %
Lymphs Abs: 0.8 K/uL (ref 0.7–4.0)
MCH: 33.1 pg (ref 26.0–34.0)
MCHC: 31.9 g/dL (ref 30.0–36.0)
MCV: 103.5 fL — ABNORMAL HIGH (ref 80.0–100.0)
Monocytes Absolute: 0.4 K/uL (ref 0.1–1.0)
Monocytes Relative: 7 %
Neutro Abs: 3.7 K/uL (ref 1.7–7.7)
Neutrophils Relative %: 61 %
Platelet Count: 232 K/uL (ref 150–400)
RBC: 2.54 MIL/uL — ABNORMAL LOW (ref 4.22–5.81)
RDW: 15.8 % — ABNORMAL HIGH (ref 11.5–15.5)
WBC Count: 5.9 K/uL (ref 4.0–10.5)
nRBC: 0.7 % — ABNORMAL HIGH (ref 0.0–0.2)

## 2024-08-22 LAB — SAMPLE TO BLOOD BANK

## 2024-08-22 LAB — CMP (CANCER CENTER ONLY)
ALT: 17 U/L (ref 0–44)
AST: 44 U/L — ABNORMAL HIGH (ref 15–41)
Albumin: 3.6 g/dL (ref 3.5–5.0)
Alkaline Phosphatase: 139 U/L — ABNORMAL HIGH (ref 38–126)
Anion gap: 15 (ref 5–15)
BUN: 31 mg/dL — ABNORMAL HIGH (ref 8–23)
CO2: 21 mmol/L — ABNORMAL LOW (ref 22–32)
Calcium: 10 mg/dL (ref 8.9–10.3)
Chloride: 99 mmol/L (ref 98–111)
Creatinine: 1.65 mg/dL — ABNORMAL HIGH (ref 0.61–1.24)
GFR, Estimated: 46 mL/min — ABNORMAL LOW (ref >60)
Glucose, Bld: 135 mg/dL — ABNORMAL HIGH (ref 70–99)
Potassium: 4.8 mmol/L (ref 3.5–5.1)
Sodium: 135 mmol/L (ref 135–145)
Total Bilirubin: 0.3 mg/dL (ref 0.0–1.2)
Total Protein: 11.7 g/dL — ABNORMAL HIGH (ref 6.5–8.1)

## 2024-08-22 NOTE — Telephone Encounter (Signed)
 Spoke to pt and informed him that he does not need a blood transfusion tomorrow but to keep MD appt as scheduled.   Also, informed him that Dr. Babara has talked to Dr. Marea and she says that if [facility] calls him for an appt, those appts take priority.

## 2024-08-22 NOTE — Telephone Encounter (Signed)
 Called pt phone number but no vmbx set up  Called pt spouse and lvm, pt was scheduled this morning for labs (for possible blood transfusion tomorrow) and he has not been here yet. I left a message to have them call us  back to r/s the lab to a later time today to be able to keep appts tomorrow. Scheduling phone number provided.

## 2024-08-22 NOTE — Telephone Encounter (Signed)
 Spoke with pt and he thought all of the appts were tomorrow. Pt said he could be here within an hour. Appt time moved so he will be able to check in at registration

## 2024-08-23 ENCOUNTER — Inpatient Hospital Stay

## 2024-08-23 ENCOUNTER — Inpatient Hospital Stay: Admitting: Oncology

## 2024-08-23 ENCOUNTER — Encounter: Payer: Self-pay | Admitting: Oncology

## 2024-08-23 ENCOUNTER — Inpatient Hospital Stay: Admitting: Pharmacist

## 2024-08-23 ENCOUNTER — Telehealth: Payer: Self-pay

## 2024-08-23 VITALS — BP 115/65 | HR 74 | Temp 96.9°F | Resp 18 | Wt 153.3 lb

## 2024-08-23 DIAGNOSIS — T451X5A Adverse effect of antineoplastic and immunosuppressive drugs, initial encounter: Secondary | ICD-10-CM

## 2024-08-23 DIAGNOSIS — D6481 Anemia due to antineoplastic chemotherapy: Secondary | ICD-10-CM | POA: Diagnosis not present

## 2024-08-23 DIAGNOSIS — C9000 Multiple myeloma not having achieved remission: Secondary | ICD-10-CM

## 2024-08-23 DIAGNOSIS — C9002 Multiple myeloma in relapse: Secondary | ICD-10-CM | POA: Diagnosis not present

## 2024-08-23 DIAGNOSIS — I5031 Acute diastolic (congestive) heart failure: Secondary | ICD-10-CM | POA: Diagnosis not present

## 2024-08-23 DIAGNOSIS — N1832 Chronic kidney disease, stage 3b: Secondary | ICD-10-CM | POA: Diagnosis not present

## 2024-08-23 LAB — KAPPA/LAMBDA LIGHT CHAINS
Kappa free light chain: 5.2 mg/L (ref 3.3–19.4)
Kappa, lambda light chain ratio: 0 — ABNORMAL LOW (ref 0.26–1.65)
Lambda free light chains: 1689.7 mg/L — ABNORMAL HIGH (ref 5.7–26.3)

## 2024-08-23 MED ORDER — DENOSUMAB 120 MG/1.7ML ~~LOC~~ SOLN: 120.0000 mg | Freq: Once | SUBCUTANEOUS | Status: DC

## 2024-08-23 NOTE — Telephone Encounter (Signed)
 Fax sent to [email or web address] to let them know that Dr. Babara will DC Xpovio 

## 2024-08-24 ENCOUNTER — Inpatient Hospital Stay

## 2024-08-24 ENCOUNTER — Encounter: Payer: Self-pay | Admitting: Oncology

## 2024-08-24 DIAGNOSIS — C9000 Multiple myeloma not having achieved remission: Secondary | ICD-10-CM

## 2024-08-24 DIAGNOSIS — C9002 Multiple myeloma in relapse: Secondary | ICD-10-CM | POA: Diagnosis not present

## 2024-08-24 LAB — MULTIPLE MYELOMA PANEL, SERUM
Albumin SerPl Elph-Mcnc: 3.1 g/dL (ref 2.9–4.4)
Albumin/Glob SerPl: 0.5 — ABNORMAL LOW (ref 0.7–1.7)
Alpha 1: 0.3 g/dL (ref 0.0–0.4)
Alpha2 Glob SerPl Elph-Mcnc: 0.8 g/dL (ref 0.4–1.0)
B-Globulin SerPl Elph-Mcnc: 1.2 g/dL (ref 0.7–1.3)
Gamma Glob SerPl Elph-Mcnc: 5.2 g/dL — ABNORMAL HIGH (ref 0.4–1.8)
Globulin, Total: 7.5 g/dL — ABNORMAL HIGH (ref 2.2–3.9)
IgA: 5995 mg/dL — ABNORMAL HIGH (ref 61–437)
IgG (Immunoglobin G), Serum: 370 mg/dL — ABNORMAL LOW (ref 603–1613)
IgM (Immunoglobulin M), Srm: 11 mg/dL — ABNORMAL LOW (ref 20–172)
M Protein SerPl Elph-Mcnc: 3.4 g/dL — ABNORMAL HIGH
Total Protein ELP: 10.6 g/dL — ABNORMAL HIGH (ref 6.0–8.5)

## 2024-08-24 MED ORDER — DENOSUMAB 120 MG/1.7ML ~~LOC~~ SOLN
120.0000 mg | Freq: Once | SUBCUTANEOUS | Status: AC
  Administered 2024-08-24: 120 mg via SUBCUTANEOUS
  Filled 2024-08-24: qty 1.7

## 2024-08-24 NOTE — Assessment & Plan Note (Signed)
 Encourage oral hydration and avoid nephrotoxins.

## 2024-08-24 NOTE — Assessment & Plan Note (Signed)
 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   Labs are reviewed and discussed with patient.   07/05/2024 cycle 2 Selinexor  to 40mg , with Pomalyst  2 mg day 1-21, and dexamethasone  20 mg weekly  Cycle 3 was not given due to cytopenia.  His disease appears to be refractory to treatment as evidenced by rapid progression of M protein to 3.4, free lamda light chain to 1600. I discussed with his [facility] Oncologist Dr. Marea. He maybe getting treatment of Talquetamab bridging to CAR-T. Pending further discussion on [facility] Myeloma tumor board.    Continue Acyclovir ,  on Eliquis  5mg  BID for A fib Xgeva  monthly if calcium  meets criteria - he gets Xgeva  today Recommend calcium  and vitamin D  supplementation.

## 2024-08-24 NOTE — Assessment & Plan Note (Signed)
 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  Lab Results  Component Value Date   HGB 8.4 (L) 08/22/2024   TIBC 304 03/28/2024   IRONPCTSAT 23 03/28/2024   FERRITIN 535 (H) 03/28/2024    Hemoglobin has improved after blood transfusion. Monitor

## 2024-08-24 NOTE — Assessment & Plan Note (Signed)
 Follow up with cardiology Patient is on Lasix  20 mg daily. PRN leg edema

## 2024-08-24 NOTE — Progress Notes (Signed)
 Hematology/Oncology Progress note [phone] [phone]         Patient Care Team: Cleotilde Oneil FALCON, MD as PCP - General (Internal Medicine) Darliss Rogue, MD as PCP - Cardiology (Cardiology) Kennyth Chew, MD as PCP - Electrophysiology (Clinical Cardiac Electrophysiology) Babara Call, MD as Consulting Physician (Oncology) Riddle, Suzann, NP as Nurse Practitioner (Clinical Cardiac Electrophysiology) Franchester Mikey DEL, PA-C as Physician Assistant (Cardiology)  CHIEF COMPLAINTS/REASON FOR VISIT:  Multiple myeloma   ASSESSMENT & PLAN:   Cancer Staging  Multiple myeloma [facility]) Staging form: Plasma Cell Myeloma and Plasma Cell Disorders, AJCC 8th Edition - Clinical stage from 04/14/2023: RISS Stage II (Beta-2 -microglobulin (mg/L): 8.4, Albumin (g/dL): 2.6, ISS: Stage III, High-risk cytogenetics: Absent, LDH: Normal) - Signed by Babara Call, MD on 04/22/2023   Multiple myeloma (HCC) IgA lamda multiple myeloma, 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   Labs are reviewed and discussed with patient.   07/05/2024 cycle 2 Selinexor  to 40mg , with Pomalyst  2 mg day 1-21, and dexamethasone  20 mg weekly  Cycle 3 was not given due to cytopenia.  His disease appears to be refractory to treatment as evidenced by rapid progression of M protein to 3.4, free lamda light chain to 1600. I discussed with his [facility] Oncologist Dr. Marea. He maybe getting treatment of Talquetamab bridging to CAR-T. Pending further discussion on [facility] Myeloma tumor board.    Continue Acyclovir ,  on Eliquis  5mg  BID for A fib Xgeva  monthly if calcium  meets criteria - he gets Xgeva  today Recommend calcium  and vitamin D  supplementation.     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  Lab Results  Component Value Date   HGB 8.4 (L) 08/22/2024   TIBC 304 03/28/2024   IRONPCTSAT 23 03/28/2024   FERRITIN 535 (H) 03/28/2024    Hemoglobin has improved after blood transfusion. Monitor  CKD (chronic kidney disease) stage 3, GFR 30-59 ml/min (HCC) Encourage oral hydration and avoid nephrotoxins.     Diastolic CHF, acute (HCC) Follow up with cardiology Patient is on Lasix  20 mg daily. PRN leg edema   Orders Placed This Encounter  Procedures   Ambulatory referral to Social Work    Referral Priority:   Routine    Referral Type:   Consultation    Referral Reason:   Specialty Services Required    Number of Visits Requested:   1   Follow-up TBD   We spent sufficient time to discuss many aspect of care, questions were answered to patient's satisfaction.    Call Babara, MD, PhD [facility] Hematology Oncology 08/23/2024     HISTORY OF PRESENTING ILLNESS:  Victor Phillips is a  68 y.o.  male with PMH listed below who was referred to me for anemia  02/11/2023 - 02/14/2023 recent hospitalization due to pneumonia, respiratory failure.  He was found to have a hemoglobin decreased at 6.8, status post PRBC transfusion during admission.  EGD showed gastritis.  Colonoscopy was not remarkable. 02/11/2023 TIBC 221 ferritin 107, iron saturation 16. Patient is currently taking fusion plus Vitamin B12 level in the 300s. His echo showed grade 2 diastolic CHF.  He denies recent chest  pain on exertion, shortness of breath on minimal exertion, pre-syncopal episodes, or palpitations He had not noticed any recent bleeding such as epistaxis, hematuria or hematochezia.  He denies over the counter NSAID ingestion.  Oncology History  Multiple myeloma (HCC)  04/14/2023 Initial Diagnosis   Multiple myeloma   03/13/2020 multiple myeloma panel showed M protein 4.3, IgA  lambda Free lamda Level 142,-light chain ratio 0.07 04/05/2023 bone marrow biopsy showed hypercellular bone marrow with plasma cell neoplasm.The bone marrow is hypercellular for age with prominent increase in plasma cells representing 65% of all cells in the aspirate associated with interstitial infiltrates and numerous variably sized aggregates in  the clot and biopsy sections.  The plasma cells display lambda light chain restriction consistent with plasma cell neoplasm.  Normal cytogenetics.  FISH studies pending.    04/14/2023 Cancer Staging   Staging form: Plasma Cell Myeloma and Plasma Cell Disorders, AJCC 8th Edition - Clinical stage from 04/14/2023: RISS Stage II (Beta-2 -microglobulin (mg/L): 8.4, Albumin (g/dL): 2.6, ISS: Stage III, High-risk cytogenetics: Absent, LDH: Normal) - Signed by Babara Call, MD on 04/22/2023 Stage prefix: Initial diagnosis Beta 2 microglobulin range (mg/L): Greater than or equal to 5.5 Albumin range (g/dL): Less than 3.5 Cytogenetics: 1q addition, Other mutation   04/14/2023 Imaging   Skeletal survey 1. No lytic lesions or other intrinsic bony abnormality. 2. Borderline cardiomegaly with an interval decrease in size. 3. Diffuse atheromatous arterial calcifications including bilateral carotid artery calcifications, right greater than left   04/22/2023 - 11/11/2023 Chemotherapy   Patient is on Treatment Plan : MYELOMA NEWLY DIAGNOSED TRANSPLANT CANDIDATE DaraVRd (Daratumumab  SQ) q21d     04/25/2023 Imaging   PET scan showed  1. Two tiny hypermetabolic lucent lesions in the left posteromedial eighth and ninth ribs. No additional evidence of multiple myeloma. 2. Aortic atherosclerosis (ICD10-I70.0). Coronary artery calcification.   07/14/2023 - 07/19/2023 Hospital Admission   Hospitalized due to ileus.    07/25/2023 Bone Marrow Biopsy   A-C. Peripheral blood and bone marrow, (peripheral smear, aspirate smear, touch preparation, clot section, core biopsy):   Persistent  plasma cell myeloma, 50-60% lambda restricted plasma cells on core biopsy. Hypercellular bone marrow (70%) with trilineage hematopoiesis. Negative for increased blasts. Negative for significant dysplasia. Negative for amyloid deposition.   01/25/2024 - 03/28/2024 Chemotherapy   Patient is on Treatment Plan : MYELOMA RELAPSED/ REFRACTORY Carfilzomib  D1,8,15 (20/27) + selinexor   + Dexamethasone  (KPd) q28d       A Fib. on Eliquis  5mg  BID, follows up with cardiology.  Patient reports being compliant on Eliquis .  Denies any hematochezia or melena. Patient is off Lasix  20 mg for most days, only uses PRN leg edema.  Denies fever, chills, diarrhea, chest pain, nausea vomiting, SOB, leg swelling. . + fatigue. Appetite is fair.   MEDICAL HISTORY:  Past Medical History:  Diagnosis Date   Atrial fibrillation (HCC)    Diabetes mellitus without complication (HCC)    Hypercholesteremia    Hypertension    Multiple myeloma not having achieved remission (HCC)     SURGICAL HISTORY: Past Surgical History:  Procedure Laterality Date   CARDIOVERSION N/A 10/17/2023   Procedure: CARDIOVERSION;  Surgeon: Darliss Rogue, MD;  Location: [facility] ORS;  Service: Cardiovascular;  Laterality: N/A;   COLONOSCOPY N/A 02/13/2023   Procedure: COLONOSCOPY;  Surgeon: Toledo, Ladell POUR, MD;  Location: [facility] ENDOSCOPY;  Service: Gastroenterology;  Laterality: N/A;   COLONOSCOPY N/A 02/14/2023   Procedure: COLONOSCOPY;  Surgeon: Toledo, Ladell POUR, MD;  Location: [facility] ENDOSCOPY;  Service: Gastroenterology;  Laterality: N/A;   ESOPHAGOGASTRODUODENOSCOPY N/A 02/13/2023   Procedure: ESOPHAGOGASTRODUODENOSCOPY (EGD);  Surgeon: Toledo, Ladell POUR, MD;  Location: [facility] ENDOSCOPY;  Service: Gastroenterology;  Laterality: N/A;   IR BONE MARROW BIOPSY & ASPIRATION  11/24/2023    SOCIAL HISTORY: Social History   Socioeconomic History   Marital status: Married    Spouse name: Not on file   Number of children: Not on file   Years of  education: Not on file   Highest education level: Not on file  Occupational History   Not on file  Tobacco Use   Smoking status: Never   Smokeless tobacco: Never  Vaping Use   Vaping status: Never Used  Substance and Sexual Activity   Alcohol use: Not Currently    Comment: quit approx 2001   Drug use: No   Sexual activity: Not on file  Other Topics Concern   Not on file  Social History Narrative   Not on file   Social Drivers of Health   Tobacco Use: Low Risk (08/23/2024)   Patient History    Smoking Tobacco Use: Never    Smokeless Tobacco Use: Never    Passive Exposure: Not on file  Recent Concern: Tobacco Use - High Risk (08/14/2024)   Received from [facility]   Patient History    Smoking Tobacco Use: Never    Smokeless Tobacco Use: Current    Passive Exposure: Not on file  Financial Resource Strain: Low Risk  (08/14/2024)   Received from [facility]   Overall Financial Resource Strain (CARDIA)    Difficulty of Paying Living Expenses: Not hard at all  Food Insecurity: No Food Insecurity (08/14/2024)   Received from [facility]   Epic    Within the past 12 months, you worried that your food would run out before you got the money to buy more.: Never true    Within the past 12 months, the food you bought just didn't last and you didn't have money to get more.: Never true  Transportation Needs: No Transportation Needs (08/14/2024)   Received from [facility] - Transportation    In the past 12 months, has lack of transportation kept you from medical appointments or from getting medications?: No    Lack of Transportation (Non-Medical): No  Physical Activity: Insufficiently Active (10/12/2023)   Received from [facility]   Exercise Vital Sign    On average, how many days per week do you engage in moderate to strenuous exercise (like a brisk walk)?: 7 days    On average, how  many minutes do you engage in exercise at this level?: 10 min  Stress: No Stress Concern Present (10/12/2023)   Received from [facility] - Occupational Stress Questionnaire    Feeling of Stress : Not at all  Recent Concern: Stress - Stress Concern Present (10/04/2023)   Received from [facility] - Occupational Stress Questionnaire    Feeling of Stress : Rather much  Social Connections: Moderately Integrated (10/12/2023)   Received from [facility]   Social Connection and Isolation Panel    In a typical week, how many times do you talk on the phone with family, friends, or neighbors?: More than three times a week    How often do you get together with friends or relatives?:  More than three times a week    How often do you attend church or religious services?: 1 to 4 times per year    Do you belong to any clubs or organizations such as church groups, unions, fraternal or athletic groups, or school groups?: No    How often do you attend meetings of the clubs or organizations you belong to?: Never    Are you married, widowed, divorced, separated, never married, or living with a partner?: Married  Intimate Partner Violence: Not At Risk (07/14/2023)   Humiliation, Afraid, Rape, and Kick questionnaire    Fear of Current or Ex-Partner: No    Emotionally Abused: No    Physically Abused: No    Sexually Abused: No  Depression (PHQ2-9): Low Risk (08/03/2024)   Depression (PHQ2-9)    PHQ-2 Score: 0  Alcohol Screen: Not on file  Housing: Low Risk  (08/14/2024)   Received from [facility]   Epic    In the last 12 months, was there a time when you were not able to pay the mortgage or rent on time?: No    In the past 12 months, how many times have you moved where you were living?: 0    At any time in the past 12 months, were you homeless or living  in a shelter (including now)?: No  Utilities: Not At Risk (08/14/2024)   Received from [facility]   Epic    In the past 12 months has the electric, gas, oil, or water  company threatened to shut off services in your home?: No  Health Literacy: Adequate Health Literacy (01/13/2024)   B1300 Health Literacy    Frequency of need for help with medical instructions: Rarely    FAMILY HISTORY: Family History  Problem Relation Age of Onset   Diabetes Mother    Heart attack Father     ALLERGIES:  has no known allergies.  MEDICATIONS:  Current Outpatient Medications  Medication Sig Dispense Refill   acetaminophen  (TYLENOL ) 325 MG tablet Take 2 tablets (650 mg total) by mouth every 6 (six) hours as needed for mild pain (or Fever >/= 101). 90 tablet 1   acyclovir  (ZOVIRAX ) 400 MG tablet Take 1 tablet by mouth twice daily 180 tablet 0   amiodarone  (PACERONE ) 200 MG tablet Take 1 tablet (200 mg total) by mouth daily. 90 tablet 2   apixaban  (ELIQUIS ) 5 MG TABS tablet Take 1 tablet (5 mg total) by mouth 2 (two) times daily. 60 tablet 5   atorvastatin  (LIPITOR) 10 MG tablet Take 10 mg by mouth daily.     calcium  carbonate (OS-CAL) 600 MG TABS tablet Take 2 tablets (1,200 mg total) by mouth daily.     cholecalciferol (VITAMIN D3) 25 MCG (1000 UNIT) tablet Take 1 tablet (1,000 Units total) by mouth daily.     cyanocobalamin  (VITAMIN B12) 1000 MCG tablet Take 1 tablet (1,000 mcg total) by mouth daily.     empagliflozin  (JARDIANCE ) 10 MG TABS tablet Take 1 tablet (10 mg total) by mouth daily before breakfast. 30 tablet 2   furosemide  (LASIX ) 20 MG tablet Take 1 tablet (20 mg total) by mouth daily.     glipiZIDE (GLUCOTROL XL) 2.5 MG 24 hr tablet Take 5 mg by mouth daily with breakfast.     loperamide  (IMODIUM ) 2 MG capsule Take 1 capsule (2 mg total) by mouth See admin instructions. With onset of diarrhea, take 4 mg,then 2 mg every 2 hours or after  every loose bowel movements  maximum: 16  mg/day 60 capsule 2   metoprolol  succinate (TOPROL -XL) 25 MG 24 hr tablet Take 0.5 tablets (12.5 mg total) by mouth daily. 45 tablet 1   Multiple Vitamin (MULTIVITAMIN WITH MINERALS) TABS tablet Take 1 tablet by mouth daily. 90 tablet 1   ondansetron  (ZOFRAN ) 8 MG tablet Take 1 tablet (8 mg total) by mouth every 8 (eight) hours as needed for nausea or vomiting. 90 tablet 1   pantoprazole  (PROTONIX ) 40 MG tablet Take 1 tablet (40 mg total) by mouth daily. 30 tablet 1   prochlorperazine  (COMPAZINE ) 10 MG tablet Take 1 tablet (10 mg total) by mouth every 6 (six) hours as needed for nausea or vomiting. 90 tablet 1   sacubitril -valsartan  (ENTRESTO ) 24-26 MG Take 1 tablet by mouth 2 (two) times daily. 60 tablet 4   spironolactone  (ALDACTONE ) 25 MG tablet Take 0.5 tablets (12.5 mg total) by mouth daily. 45 tablet 1   temazepam (RESTORIL) 15 MG capsule Take 15 mg by mouth at bedtime.     predniSONE (DELTASONE) 10 MG tablet Take 10 mg by mouth daily. (Patient not taking: Reported on 08/23/2024)     No current facility-administered medications for this visit.    Review of Systems  Constitutional:  Positive for fatigue. Negative for appetite change, chills, fever and unexpected weight change.  HENT:   Negative for hearing loss and voice change.   Eyes:  Negative for eye problems and icterus.  Respiratory:  Negative for chest tightness, cough and shortness of breath.   Cardiovascular:  Negative for chest pain and leg swelling.  Gastrointestinal:  Negative for abdominal distention, abdominal pain and diarrhea.  Endocrine: Negative for hot flashes.  Genitourinary:  Negative for difficulty urinating, dysuria and frequency.   Musculoskeletal:  Negative for arthralgias.  Skin:  Negative for itching and rash.  Neurological:  Negative for light-headedness and numbness.  Hematological:  Negative for adenopathy. Does not bruise/bleed easily.  Psychiatric/Behavioral:  Positive for sleep disturbance. Negative  for confusion.     PHYSICAL EXAMINATION: Vitals:   08/23/24 0840  BP: 115/65  Pulse: 74  Resp: 18  Temp: (!) 96.9 F (36.1 C)   Filed Weights   08/23/24 0840  Weight: 153 lb 4.8 oz (69.5 kg)    Physical Exam Constitutional:      General: He is not in acute distress.    Appearance: He is obese.  HENT:     Head: Normocephalic and atraumatic.     Mouth/Throat:     Comments: Dry oral mucosa  Eyes:     General: No scleral icterus. Cardiovascular:     Rate and Rhythm: Normal rate.  Pulmonary:     Effort: Pulmonary effort is normal. No respiratory distress.     Breath sounds: No wheezing.  Abdominal:     General: Bowel sounds are normal. There is no distension.     Palpations: Abdomen is soft.  Musculoskeletal:        General: Normal range of motion.     Cervical back: Normal range of motion and neck supple.     Right lower leg: No edema.     Left lower leg: No edema.  Skin:    General: Skin is warm and dry.     Findings: No erythema or rash.  Neurological:     Mental Status: He is alert and oriented to person, place, and time. Mental status is at baseline.     Cranial Nerves: No cranial nerve  deficit.  Psychiatric:        Mood and Affect: Mood normal.      LABORATORY DATA:  I have reviewed the data as listed    Latest Ref Rng & Units 08/22/2024    2:51 PM 08/16/2024    8:46 AM 08/06/2024   10:43 AM  CBC  WBC 4.0 - 10.5 K/uL 5.9  3.7  2.4   Hemoglobin 13.0 - 17.0 g/dL 8.4  7.9  8.3   Hematocrit 39.0 - 52.0 % 26.3  24.4  25.1   Platelets 150 - 400 K/uL 232  184  125       Latest Ref Rng & Units 08/22/2024    2:51 PM 08/16/2024    8:46 AM 08/06/2024   10:43 AM  CMP  Glucose 70 - 99 mg/dL 864  820  875   BUN 8 - 23 mg/dL 31  35  29   Creatinine 0.61 - 1.24 mg/dL 8.34  8.21  8.44   Sodium 135 - 145 mmol/L 135  137  134   Potassium 3.5 - 5.1 mmol/L 4.8  4.7  4.8   Chloride 98 - 111 mmol/L 99  101  104   CO2 22 - 32 mmol/L 21  22  21    Calcium  8.9 - 10.3  mg/dL 89.9  9.6  8.5   Total Protein 6.5 - 8.1 g/dL 88.2  89.1  9.9   Total Bilirubin 0.0 - 1.2 mg/dL 0.3  0.3  0.9   Alkaline Phos 38 - 126 U/L 139  141  118   AST 15 - 41 U/L 44  36  17   ALT 0 - 44 U/L 17  15  12     Lab Results  Component Value Date   IRON 70 03/28/2024   TIBC 304 03/28/2024   IRONPCTSAT 23 03/28/2024   FERRITIN 535 (H) 03/28/2024     RADIOGRAPHIC STUDIES: I have personally reviewed the radiological images as listed and agreed with the findings in the report. No results found.

## 2024-08-24 NOTE — Telephone Encounter (Signed)
 Created in error

## 2024-08-24 NOTE — Assessment & Plan Note (Signed)
 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   Labs are reviewed and discussed with patient.   07/05/2024 cycle 2 Selinexor  to 40mg , with Pomalyst  2 mg day 1-21, and dexamethasone  20 mg weekly  Cycle 3 was not given due to cytopenia.  His disease appears to be refractory to treatment as evidenced by rapid progression   Continue Acyclovir ,  on Eliquis  5mg  BID for A fib Xgeva  monthly if calcium  meets criteria - he gets Xgeva  today Recommend calcium  and vitamin D  supplementation.

## 2024-08-30 ENCOUNTER — Inpatient Hospital Stay

## 2024-09-02 ENCOUNTER — Emergency Department

## 2024-09-02 ENCOUNTER — Other Ambulatory Visit: Payer: Self-pay

## 2024-09-02 ENCOUNTER — Emergency Department
Admission: EM | Admit: 2024-09-02 | Discharge: 2024-09-03 | Disposition: A | Attending: Emergency Medicine | Admitting: Emergency Medicine

## 2024-09-02 DIAGNOSIS — I509 Heart failure, unspecified: Secondary | ICD-10-CM | POA: Diagnosis not present

## 2024-09-02 DIAGNOSIS — I110 Hypertensive heart disease with heart failure: Secondary | ICD-10-CM | POA: Insufficient documentation

## 2024-09-02 DIAGNOSIS — D649 Anemia, unspecified: Secondary | ICD-10-CM | POA: Insufficient documentation

## 2024-09-02 DIAGNOSIS — Z8579 Personal history of other malignant neoplasms of lymphoid, hematopoietic and related tissues: Secondary | ICD-10-CM | POA: Diagnosis not present

## 2024-09-02 DIAGNOSIS — Z85830 Personal history of malignant neoplasm of bone: Secondary | ICD-10-CM | POA: Insufficient documentation

## 2024-09-02 DIAGNOSIS — R7989 Other specified abnormal findings of blood chemistry: Secondary | ICD-10-CM | POA: Insufficient documentation

## 2024-09-02 DIAGNOSIS — Z7901 Long term (current) use of anticoagulants: Secondary | ICD-10-CM | POA: Diagnosis not present

## 2024-09-02 DIAGNOSIS — M25551 Pain in right hip: Secondary | ICD-10-CM | POA: Diagnosis present

## 2024-09-02 DIAGNOSIS — I4891 Unspecified atrial fibrillation: Secondary | ICD-10-CM | POA: Insufficient documentation

## 2024-09-02 DIAGNOSIS — E119 Type 2 diabetes mellitus without complications: Secondary | ICD-10-CM | POA: Insufficient documentation

## 2024-09-02 LAB — BASIC METABOLIC PANEL WITH GFR
Anion gap: 15 (ref 5–15)
BUN: 33 mg/dL — ABNORMAL HIGH (ref 8–23)
CO2: 18 mmol/L — ABNORMAL LOW (ref 22–32)
Calcium: 7.3 mg/dL — ABNORMAL LOW (ref 8.9–10.3)
Chloride: 106 mmol/L (ref 98–111)
Creatinine, Ser: 1.81 mg/dL — ABNORMAL HIGH (ref 0.61–1.24)
GFR, Estimated: 40 mL/min — ABNORMAL LOW
Glucose, Bld: 115 mg/dL — ABNORMAL HIGH (ref 70–99)
Potassium: 4.5 mmol/L (ref 3.5–5.1)
Sodium: 139 mmol/L (ref 135–145)

## 2024-09-02 LAB — CBC WITH DIFFERENTIAL/PLATELET
Abs Immature Granulocytes: 0.55 K/uL — ABNORMAL HIGH (ref 0.00–0.07)
Basophils Absolute: 0 K/uL (ref 0.0–0.1)
Basophils Relative: 1 %
Eosinophils Absolute: 0.1 K/uL (ref 0.0–0.5)
Eosinophils Relative: 3 %
HCT: 19.1 % — ABNORMAL LOW (ref 39.0–52.0)
Hemoglobin: 5.8 g/dL — ABNORMAL LOW (ref 13.0–17.0)
Immature Granulocytes: 12 %
Lymphocytes Relative: 12 %
Lymphs Abs: 0.5 K/uL — ABNORMAL LOW (ref 0.7–4.0)
MCH: 33 pg (ref 26.0–34.0)
MCHC: 30.4 g/dL (ref 30.0–36.0)
MCV: 108.5 fL — ABNORMAL HIGH (ref 80.0–100.0)
Monocytes Absolute: 0.5 K/uL (ref 0.1–1.0)
Monocytes Relative: 11 %
Neutro Abs: 2.7 K/uL (ref 1.7–7.7)
Neutrophils Relative %: 61 %
Platelets: 105 K/uL — ABNORMAL LOW (ref 150–400)
RBC: 1.76 MIL/uL — ABNORMAL LOW (ref 4.22–5.81)
RDW: 17.7 % — ABNORMAL HIGH (ref 11.5–15.5)
Smear Review: NORMAL
WBC: 4.4 K/uL (ref 4.0–10.5)
nRBC: 1.4 % — ABNORMAL HIGH (ref 0.0–0.2)

## 2024-09-02 LAB — PREPARE RBC (CROSSMATCH)

## 2024-09-02 MED ORDER — LIDOCAINE 5 % EX PTCH
1.0000 | MEDICATED_PATCH | CUTANEOUS | Status: DC
  Administered 2024-09-02: 1 via TRANSDERMAL
  Filled 2024-09-02: qty 1

## 2024-09-02 MED ORDER — CALCIUM GLUCONATE-NACL 1-0.675 GM/50ML-% IV SOLN
1.0000 g | Freq: Once | INTRAVENOUS | Status: AC
  Administered 2024-09-02: 1000 mg via INTRAVENOUS
  Filled 2024-09-02: qty 50

## 2024-09-02 MED ORDER — LIDOCAINE 5 % EX PTCH: 1.0000 | MEDICATED_PATCH | CUTANEOUS | 0 refills | Status: AC

## 2024-09-02 MED ORDER — SODIUM CHLORIDE 0.9% IV SOLUTION
Freq: Once | INTRAVENOUS | Status: DC
  Filled 2024-09-02: qty 250

## 2024-09-02 MED ORDER — OXYCODONE HCL 5 MG PO TABS: 5.0000 mg | ORAL_TABLET | Freq: Three times a day (TID) | ORAL | 0 refills | Status: AC | PRN

## 2024-09-02 MED ORDER — ACETAMINOPHEN 500 MG PO TABS
1000.0000 mg | ORAL_TABLET | Freq: Once | ORAL | Status: AC
  Administered 2024-09-02: 1000 mg via ORAL
  Filled 2024-09-02: qty 2

## 2024-09-02 NOTE — ED Provider Notes (Signed)
 Victor Phillips Provider Note    Event Date/Time   First MD Initiated Contact with Patient 09/02/24 1322     (approximate)   History   Leg Pain   HPI  Victor Phillips is a 68 y.o. male with history of multiple myeloma, A-fib on Eliquis , diabetes, hypertension, hyperlipidemia, presenting with right hip pain.  States that he started on Thursday.  He denies any recent trauma or falls.  He denies any focal weakness or numbness.  No back pain.  On independent review, he was seen by oncology number 11th, has history of multiple myeloma, status post 8 cycles of Dara Tuma Mab, he had a second line chemo treatment that was discontinued due to CHF.  He had third line treatment that was held due to neutropenia.     Physical Exam   Triage Vital Signs: ED Triage Vitals  Encounter Vitals Group     BP 09/02/24 1058 (!) 146/80     Girls Systolic BP Percentile --      Girls Diastolic BP Percentile --      Boys Systolic BP Percentile --      Boys Diastolic BP Percentile --      Pulse Rate 09/02/24 1058 84     Resp 09/02/24 1058 16     Temp 09/02/24 1058 97.9 F (36.6 C)     Temp Source 09/02/24 1058 Oral     SpO2 09/02/24 1058 97 %     Weight --      Height --      Head Circumference --      Peak Flow --      Pain Score 09/02/24 1057 8     Pain Loc --      Pain Education --      Exclude from Growth Chart --     Most recent vital signs: Vitals:   09/02/24 1058 09/02/24 1900  BP: (!) 146/80 (!) 157/78  Pulse: 84 86  Resp: 16 18  Temp: 97.9 F (36.6 C) 98.3 F (36.8 C)  SpO2: 97% 96%     General: Awake, no distress.  CV:  Good peripheral perfusion.  Resp:  Normal effort.  Abd:  No distention.  Other:  Able to fully range his bilateral lower extremities, no focal weakness or numbness, palpable DP pulses bilaterally, he has no tenderness to the right anterior hip, no midline spinal tenderness, no saddle anesthesia.  No unilateral calf swelling or  tenderness   ED Results / Procedures / Treatments   Labs (all labs ordered are listed, but only abnormal results are displayed) Labs Reviewed  BASIC METABOLIC PANEL WITH GFR - Abnormal; Notable for the following components:      Result Value   CO2 18 (*)    Glucose, Bld 115 (*)    BUN 33 (*)    Creatinine, Ser 1.81 (*)    Calcium  7.3 (*)    GFR, Estimated 40 (*)    All other components within normal limits  CBC WITH DIFFERENTIAL/PLATELET - Abnormal; Notable for the following components:   RBC 1.76 (*)    Hemoglobin 5.8 (*)    HCT 19.1 (*)    MCV 108.5 (*)    RDW 17.7 (*)    Platelets 105 (*)    nRBC 1.4 (*)    Lymphs Abs 0.5 (*)    Abs Immature Granulocytes 0.55 (*)    All other components within normal limits  PREPARE RBC (CROSSMATCH)  TYPE  AND SCREEN      RADIOLOGY On my independent interpretation, x-ray without obvious fracture   PROCEDURES:  Critical Care performed: Yes, see critical care procedure note(s)  .Critical Care  Performed by: Waymond Lorelle Cummins, MD Authorized by: Waymond Lorelle Cummins, MD   Critical care provider statement:    Critical care time (minutes):  40   Critical care was time spent personally by me on the following activities:  Development of treatment plan with patient or surrogate, discussions with consultants, evaluation of patient's response to treatment, examination of patient, ordering and review of laboratory studies, ordering and review of radiographic studies, ordering and performing treatments and interventions, pulse oximetry, re-evaluation of patient's condition and review of old charts    MEDICATIONS ORDERED IN ED: Medications  lidocaine  (LIDODERM ) 5 % 1 patch (1 patch Transdermal Patch Applied 09/02/24 1407)  0.9 %  sodium chloride  infusion (Manually program via Guardrails IV Fluids) (0 mLs Intravenous Hold 09/02/24 1431)  acetaminophen  (TYLENOL ) tablet 1,000 mg (1,000 mg Oral Given 09/02/24 1407)  calcium  gluconate 1 g/ 50 mL sodium  chloride IVPB (1,000 mg Intravenous New Bag/Given 09/02/24 1518)     IMPRESSION / MDM / ASSESSMENT AND PLAN / ED COURSE  I reviewed the triage vital signs and the nursing notes.                              Differential diagnosis includes, but is not limited to, contusion, strain, sprain, osteoarthritis, pathologic fracture.  X-rays were obtained out of triage, no obvious fracture noted, get a CT pelvis.  Labs.  Tylenol , Lidoderm  patch.  Patient's presentation is most consistent with acute presentation with potential threat to life or bodily function.  Independent interpretation of labs and imaging below.  Imaging without obvious fracture.  Did obtain labs, he is noted to be anemic, on independent chart review, he has been anemic in the past due to chemotherapy.  Clinical course as below.  Patient can be discharged after his transfusion.  Considered but no indication for inpatient admission at this time, he safe for outpatient management.  Discussed with him about outpatient follow-up with oncology this week to get reassessed.  Will also send some Lidoderm  patches to his pharmacy.  Did discuss with him about taking Tylenol  as needed for pain.  He is asking for something stronger.  Will give him a very short course of oxycodone  but discussed extensively with him that he is to only take it at night prior to going to bed.  He is agreeable with this plan.  Will discharge after transfusion and strict return precautions given.    Clinical Course as of 09/02/24 1941  Sun Sep 02, 2024  1350 DG Hip Unilat W or Wo Pelvis 2-3 Views Right IMPRESSION: 1. No acute findings. 2. Mild bilateral hip degenerative joint disease.   [TT]  1415 CT PELVIS WO CONTRAST IMPRESSION: 1. No acute osseous pathology. 2. Small fat containing right inguinal hernia. 3.  Aortic Atherosclerosis (ICD10-I70.0).   [TT]  1432 Independent review of labs, he is anemic, electrolytes not severely deranged, creatinine is mildly  elevated, has been elevated in the past.  Discussed with patient about imaging and lab results, he denies any hematochezia or melena, no evidence of bleed.  States that he is gone transfusions in the past, discussed recent benefits and he is agreeable to plan to proceed.  Patient also noted to be hypocalcemic, labs obtained recently shows a  calcium  of 10.  Given that he is also getting blood, will give him a gram of calcium  gluconate. [TT]    Clinical Course User Index [TT] Waymond Lorelle Cummins, MD     FINAL CLINICAL IMPRESSION(S) / ED DIAGNOSES   Final diagnoses:  Right hip pain  Anemia, unspecified type     Rx / DC Orders   ED Discharge Orders          Ordered    lidocaine  (LIDODERM ) 5 %  Every 24 hours        09/02/24 1726    oxyCODONE  (ROXICODONE ) 5 MG immediate release tablet  Every 8 hours PRN        09/02/24 1726             Note:  This document was prepared using Dragon voice recognition software and may include unintentional dictation errors.    Waymond Lorelle Cummins, MD 09/02/24 [phone]

## 2024-09-02 NOTE — ED Triage Notes (Signed)
 Pt comes via EMS from home with right leg and right hip pain. Pt does have bone cancer. Pt states pain started on Thursday. Pt denies any falls.

## 2024-09-02 NOTE — Discharge Instructions (Signed)
 Please follow-up with your oncologist for further management of your anemia as well as multiple myeloma.  For your hip pain, I have sent some Lidoderm  patches to your pharmacy, please take Tylenol  as needed for pain.  I am giving you a short prescription for oxycodone  please only take that at night when you are about to sleep.  Please do not try to walk without assistance or drive while on the oxycodone  since it can make you sleepy.

## 2024-09-04 ENCOUNTER — Inpatient Hospital Stay: Admitting: Hospice and Palliative Medicine

## 2024-09-04 ENCOUNTER — Telehealth: Payer: Self-pay | Admitting: *Deleted

## 2024-09-04 ENCOUNTER — Telehealth: Payer: Self-pay | Admitting: Oncology

## 2024-09-04 ENCOUNTER — Other Ambulatory Visit: Payer: Self-pay | Admitting: Oncology

## 2024-09-04 ENCOUNTER — Other Ambulatory Visit: Payer: Self-pay

## 2024-09-04 DIAGNOSIS — C9000 Multiple myeloma not having achieved remission: Secondary | ICD-10-CM

## 2024-09-04 LAB — BPAM RBC
Blood Product Expiration Date: 202512242359
Blood Product Expiration Date: 202601132359
ISSUE DATE / TIME: 202512212021
ISSUE DATE / TIME: 202512212249
Unit Type and Rh: 5100
Unit Type and Rh: 9500

## 2024-09-04 LAB — TYPE AND SCREEN
ABO/RH(D): B POS
Antibody Screen: NEGATIVE
Unit division: 0
Unit division: 0

## 2024-09-04 NOTE — Telephone Encounter (Signed)
 MD added tx appts for pt.   I called and spoke with pt to confirm the appts. Pt also mentioned he wanted pain pills so I told him I will transfer the call to triage and she can assist with that. Call transferred.

## 2024-09-04 NOTE — Telephone Encounter (Signed)
 " Symptom Triage Protocol:  Pain-of Right Hip. Needs RF of oxycodone .        NURSING SYMPTOM TRIAGE ASSESSMENT & DISPOSITION  Mode of interaction:  Telephone Date of symptom triage interaction:  09/04/2024 Time of symptom triage interaction:  1029 am  Cancer diagnosis:  Multiple myeloma not having achieved remission (HCC) Patient is on Treatment Plan :  MYELOMA MAINTENANCE Bortezomib  SQ D1,8,15 + Dexamethasone  D1,8,15 q21d x 6 cycles   Last treatment date:  - plan to start velcade  on 12/31  Oral chemotherapy:  No  Symptom Triage Protocol:  Pain, Right HIP  History of the problem:  Pain Pain Rating (0-10): Currently 10/10, on average 10/10, at worst 10/10, at best 10/10   Pain Location: Right Hip Pain   Pain Quality: Sharp   Associated Pain Symptoms: None   When Did The Pain Start: Patient reports pain started last week . Was seen in the ER 12/21 last night and giving oxycodone  script- Take 1 tablet (5 mg total) by mouth every 8 (eight) hours as needed for up to 6 doses. #6 tablets   Is the Pain Constant: Yes   Does the Pain Flare:   Yes, patient reports that when the oxycodone  wears off it hurts severely.   Does Anything Make the Pain Worse: Yes, movement. Can not put pressure on the right leg   Does Anything Make the Pain Better:  Yes, oxycodone  improved the pain      Assess for Changes in ADLs: Can not put pressure on the right leg and this affects his ability to get dressed and ambulate. Increases his risks for falls as he is not able to bear weight on this leg due to the pain.      In the past, what treatments have you tried for your pain: ER has prescribed Tylenol  and Lidoderm  patches as well. This does not help his pain. He was only given #6 tablets for pain of oxycodone . This helps only temporarily.   Did these treatments help: No; Stated that he needs RF on oxycodone  as ER only gave him #6 tablets. The oxy only puts a bandaid on the pain. Does not give him  complete relief  Additional commentary: Patient had additional imaging in ER last night of the Right Hip  DG GHIP IMPRESSION: 1. No acute findings. 2. Mild bilateral hip degenerative joint disease.  CT pelvis w/o contrast IMPRESSION: 1. No acute osseous pathology. 2. Small fat containing right inguinal hernia. 3.  Aortic Atherosclerosis (ICD10-I70.0).   Patient does not have follow-up with dr. Babara until next wed. 12/31    Triage Nurse Guidance  Severity Index  Seek emergency care, call an ambulance immediately for: Signs/symptoms of acute injury, spinal cord compression, pathologic fracture, infection, or other life threatening problem Sudden onset of severe weakness or unrelenting localized pain Inability to ambulate or decreased sensation in extremities Loss of control of bowel or bladder Chest pain  Seek medical care within two to four hours for: Sudden onset of moderate to severe pain Pain not responsive to current medication regimen Pain that interferes with mobility  Seek medical care within twenty-four hours for: Mild to moderate pain that has been increasing Pain that is interfering with activity or sleep  Follow home care instructions for: Mild to moderate aches and pains Notify the provider if no improvement within twenty-four hours   Provider Consulted  Provider name and credentials: Dr. Zelphia Babara  Provider instruction: Dr. Babara recommends for patient to see [facility] for pain management.      Patient Instruction  Disclaimer:  Patient specific and [employer] instruction provided verbally and sent through MyChart for active users.    Report the following problems No improvement in pain Pain that does not subside with interventions Other side effects, such as sedation, nausea, or constipation  Seek emergency care immediately if any of the following occur: Excruciating pain Immobility Low back pain associated with loss of bladder or bowel control; bilateral extremity  weakness  Teach Back Method used:  Yes    Nurse Triage Priority:  Urgent (obtain medical orders as indicated with instruction for 8-24 hour or sooner follow-up)  Barriers to Care:  None identified-other than acute pain and inability to move R hip.  Nurse Triage Disposition:  In-person follow-up visit:  Dr. Babara recommends for patient to see Josh B for pain management.  Patient declines in-person apt with Josh as he is not able to really move due to the pain today. He declines MyChart as he does not have mychart/virtual set up. Per Sidra, NP he can set up a telephone call shortly, but assessment will be limited. Patient is aware to the limitations of the telephone call and agreeable to telephone apt. He has an apt next week with Dr. Babara to follow-up.3  RN gave verbal instructions on when to seek medical care for R hip pain. Unable to send instructions to patient since his mychart is not active.      Protocol Source:  Ruthine HERO., & Eldonna CANDIE Pee.). (2019). Telephone triage for oncology nurses (3rd ed.). Oncology Nursing Society.  "

## 2024-09-04 NOTE — Progress Notes (Signed)
 Virtual Visit via Telephone Note  I connected with Victor Phillips on 09/04/2024 at  1:00 PM EST by telephone and verified that I am speaking with the correct person using two identifiers.  Location: Patient: Home Provider: Clinic   I discussed the limitations, risks, security and privacy concerns of performing an evaluation and management service by telephone and the availability of in person appointments. I also discussed with the patient that there may be a patient responsible charge related to this service. The patient expressed understanding and agreed to proceed.   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  Second line carfilzomib  and selinexor  was discontinued due to CHF.  Patient now on third line selinexor  60mg  Pomalyst  2mg  Dexathesone.  Treatment was held due to neutropenia.   Observations/Objective: Patient saw Dr. Babara on 08/23/2024.  Treatment was felt to be refractory with rapid progression of M protein and free lambda light chain.  Patient was being referred back to [facility] for consideration of [place] bridging to CAR-T.  Patient was seen in the emergency department on 09/02/2024 with right hip pain without any evidence of trauma or falls.  Patient had imaging including x-ray and CT of the pelvis without evidence of fracture or acute osseous pathology.  Patient was discharged home with recommendation for lidocaine  and provided small quantity of oxycodone .  Patient called clinic today requesting refill of pain meds.   I spoke with patient by phone.  However, at the time of my call, patient says that he has already received a refill of oxycodone  from his PCP.  Patient reports that the oxycodone  is helping the pain.  He is ambulatory with use of a walker and is able to bear weight.  He denies any falls or known injuries.  He does feel the way he is  getting out of bed might be exacerbating the pain.  Denies rashes.  No swelling.  No shortness of breath or chest pain.  Patient says he is also using Lidoderm  patch with some improvement.  Assessment and Plan: Multiple myeloma -refractory to treatment and pending evaluation by [facility]  Hip pain -unclear etiology.  Patient had both x-ray and CT of the pelvis in the emergency department, both of which were negative for acute findings.  His oxycodone  was refilled by his PCP and patient states that he should have enough until his visit with Dr. Babara next week. He feels oxycodone  is controlling the pain at this time.   Follow Up Instructions: MD follow up next week   I discussed the assessment and treatment plan with the patient. The patient was provided an opportunity to ask questions and all were answered. The patient agreed with the plan and demonstrated an understanding of the instructions.   The patient was advised to call back or seek an in-person evaluation if the symptoms worsen or if the condition fails to improve as anticipated.  I provided 10 minutes of non-face-to-face time during this encounter.   FONDA JONELLE MOWER, NP

## 2024-09-04 NOTE — Progress Notes (Signed)
 DISCONTINUE ON PATHWAY REGIMEN - Multiple Myeloma and Other Plasma Cell Dyscrasias     Cycle 1: A cycle is 28 days:     Selinexor       Carfilzomib       Carfilzomib       Dexamethasone     Cycles 2 and beyond: A cycle is every 28 days:     Selinexor       Carfilzomib       Dexamethasone    **Always confirm dose/schedule in your pharmacy ordering system**  PRIOR TREATMENT: [id number]: Selinexor  80 mg PO Weekly + Carfilzomib  20/56 mg/m2 D1, 8, 15 + Dexamethasone  40 mg PO/IV Weekly q28 Days Until Progression or Unacceptable Toxicity  START ON PATHWAY REGIMEN - Multiple Myeloma and Other Plasma Cell Dyscrasias     A cycle is every 21 days:     Bortezomib       Dexamethasone    **Always confirm dose/schedule in your pharmacy ordering system**  Patient Characteristics: Multiple Myeloma, Relapsed / Refractory, Second through Fourth Lines of Therapy, Not a Candidate for CAR T-cell Therapy, Frail or Not a Candidate for Triplet Therapy Disease Classification: Multiple Myeloma Therapeutic Status: Refractory R2-ISS Staging: II Line of Therapy: Fourth Line Intent of Therapy: Non-Curative / Palliative Intent, Discussed with Patient

## 2024-09-04 NOTE — Telephone Encounter (Signed)
 Pt informed of MD appts next week.

## 2024-09-04 NOTE — Telephone Encounter (Signed)
 Dr. Babara recommends for patient to see Josh B for pain management.

## 2024-09-04 NOTE — Telephone Encounter (Signed)
 See triage phone call.

## 2024-09-11 ENCOUNTER — Telehealth: Payer: Self-pay

## 2024-09-11 ENCOUNTER — Other Ambulatory Visit: Payer: Self-pay | Admitting: Oncology

## 2024-09-11 ENCOUNTER — Encounter: Payer: Self-pay | Admitting: Oncology

## 2024-09-11 ENCOUNTER — Inpatient Hospital Stay: Admitting: Oncology

## 2024-09-11 ENCOUNTER — Inpatient Hospital Stay

## 2024-09-11 ENCOUNTER — Other Ambulatory Visit: Payer: Self-pay

## 2024-09-11 VITALS — BP 130/67 | HR 75 | Temp 98.1°F | Ht 67.0 in | Wt 161.0 lb

## 2024-09-11 DIAGNOSIS — T451X5A Adverse effect of antineoplastic and immunosuppressive drugs, initial encounter: Secondary | ICD-10-CM | POA: Diagnosis not present

## 2024-09-11 DIAGNOSIS — D6481 Anemia due to antineoplastic chemotherapy: Secondary | ICD-10-CM | POA: Diagnosis not present

## 2024-09-11 DIAGNOSIS — N1832 Chronic kidney disease, stage 3b: Secondary | ICD-10-CM | POA: Diagnosis not present

## 2024-09-11 DIAGNOSIS — I5031 Acute diastolic (congestive) heart failure: Secondary | ICD-10-CM

## 2024-09-11 DIAGNOSIS — D649 Anemia, unspecified: Secondary | ICD-10-CM

## 2024-09-11 DIAGNOSIS — C9002 Multiple myeloma in relapse: Secondary | ICD-10-CM | POA: Diagnosis not present

## 2024-09-11 DIAGNOSIS — C9000 Multiple myeloma not having achieved remission: Secondary | ICD-10-CM | POA: Diagnosis not present

## 2024-09-11 DIAGNOSIS — Z5111 Encounter for antineoplastic chemotherapy: Secondary | ICD-10-CM

## 2024-09-11 LAB — CBC WITH DIFFERENTIAL (CANCER CENTER ONLY)
Abs Immature Granulocytes: 0.49 K/uL — ABNORMAL HIGH (ref 0.00–0.07)
Basophils Absolute: 0 K/uL (ref 0.0–0.1)
Basophils Relative: 0 %
Eosinophils Absolute: 0 K/uL (ref 0.0–0.5)
Eosinophils Relative: 1 %
HCT: 25.3 % — ABNORMAL LOW (ref 39.0–52.0)
Hemoglobin: 8.1 g/dL — ABNORMAL LOW (ref 13.0–17.0)
Immature Granulocytes: 10 %
Lymphocytes Relative: 8 %
Lymphs Abs: 0.4 K/uL — ABNORMAL LOW (ref 0.7–4.0)
MCH: 30.5 pg (ref 26.0–34.0)
MCHC: 32 g/dL (ref 30.0–36.0)
MCV: 95.1 fL (ref 80.0–100.0)
Monocytes Absolute: 0.4 K/uL (ref 0.1–1.0)
Monocytes Relative: 8 %
Neutro Abs: 3.5 K/uL (ref 1.7–7.7)
Neutrophils Relative %: 73 %
Platelet Count: 77 K/uL — ABNORMAL LOW (ref 150–400)
RBC: 2.66 MIL/uL — ABNORMAL LOW (ref 4.22–5.81)
RDW: 19.1 % — ABNORMAL HIGH (ref 11.5–15.5)
Smear Review: NORMAL
WBC Count: 4.8 K/uL (ref 4.0–10.5)
nRBC: 0.4 % — ABNORMAL HIGH (ref 0.0–0.2)

## 2024-09-11 LAB — CMP (CANCER CENTER ONLY)
ALT: 23 U/L (ref 0–44)
AST: 53 U/L — ABNORMAL HIGH (ref 15–41)
Albumin: 3.1 g/dL — ABNORMAL LOW (ref 3.5–5.0)
Alkaline Phosphatase: 133 U/L — ABNORMAL HIGH (ref 38–126)
Anion gap: 19 — ABNORMAL HIGH (ref 5–15)
BUN: 45 mg/dL — ABNORMAL HIGH (ref 8–23)
CO2: 16 mmol/L — ABNORMAL LOW (ref 22–32)
Calcium: 7.7 mg/dL — ABNORMAL LOW (ref 8.9–10.3)
Chloride: 98 mmol/L (ref 98–111)
Creatinine: 4.34 mg/dL — ABNORMAL HIGH (ref 0.61–1.24)
GFR, Estimated: 14 mL/min — ABNORMAL LOW
Glucose, Bld: 207 mg/dL — ABNORMAL HIGH (ref 70–99)
Potassium: 4.3 mmol/L (ref 3.5–5.1)
Sodium: 133 mmol/L — ABNORMAL LOW (ref 135–145)
Total Bilirubin: 0.4 mg/dL (ref 0.0–1.2)
Total Protein: 11.3 g/dL — ABNORMAL HIGH (ref 6.5–8.1)

## 2024-09-11 NOTE — Telephone Encounter (Signed)
 Dr. Babara has communicated with Dr. Marea at [facility] regarding pt's decreased kidney function and he recommends for pt to go to [facility] ER as this can be due to Multiple Myeloma progression. Dr. Marea will let his PA know and pt will need plasma exchange.   Called pt to notify him of this and he states he does not have anyone to take him and his sister can only take him on Mondays, then hung up. I called pt back and asked him if his sister can take him to [facility] ER tomorrow instead of coming here for blood and he said No, she can't.

## 2024-09-11 NOTE — Assessment & Plan Note (Signed)
 Will hold off treatment pending plasma exchange at [facility].

## 2024-09-11 NOTE — Assessment & Plan Note (Signed)
 Follow up with cardiology Patient is on Lasix  20 mg daily. PRN leg edema

## 2024-09-11 NOTE — Progress Notes (Signed)
 " Hematology/Oncology Progress note [phone] [phone]         Patient Care Team: Cleotilde Oneil FALCON, MD as PCP - General (Internal Medicine) Darliss Rogue, MD as PCP - Cardiology (Cardiology) Kennyth Chew, MD as PCP - Electrophysiology (Clinical Cardiac Electrophysiology) Babara Call, MD as Consulting Physician (Oncology) Riddle, Suzann, NP as Nurse Practitioner (Clinical Cardiac Electrophysiology) Franchester Mikey DEL, PA-C as Physician Assistant (Cardiology)  CHIEF COMPLAINTS/REASON FOR VISIT:  Multiple myeloma   ASSESSMENT & PLAN:   Cancer Staging  Multiple myeloma [facility]) Staging form: Plasma Cell Myeloma and Plasma Cell Disorders, AJCC 8th Edition - Clinical stage from 04/14/2023: RISS Stage II (Beta-2 -microglobulin (mg/L): 8.4, Albumin (g/dL): 2.6, ISS: Stage III, High-risk cytogenetics: Absent, LDH: Normal) - Signed by Babara Call, MD on 04/22/2023   Multiple myeloma (HCC) IgA lamda multiple myeloma, 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    07/05/2024 cycle 2 Selinexor  to 40mg , with Pomalyst  2 mg day 1-21, and dexamethasone  20 mg weekly  Cycle 3 was not given due to cytopenia.  His disease appears to be refractory to multiple lines of treatment as evidenced by rapid progression He was seen by [facility] oncology Dr. Marea.  I discussed the case with him.  Original plan is to bridge him with Velcade  and selinexor  to [facility] treatment. However today's blood work showed acute worse of kidney function, creatinine increased to 4.  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  Discussed with Dr. Marea.  We recommend patient to go to [facility] ER for plasma exchange.  Patient was called earlier by my nurse  and expressed the transportation difficulties.  I called the patient again later and he is getting some transportation help to go to [facility] ER.   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  Lab Results  Component Value Date   HGB 8.1 (L) 09/11/2024   TIBC 304 03/28/2024   IRONPCTSAT 23 03/28/2024   FERRITIN 535 (H) 03/28/2024    There is plan for blood transfusion tomorrow.  Given that he is going to [facility] ER for plasma exchange, anticipate hemoglobin will be monitored and blood transfusion to be given as needed at [facility].  CKD (chronic kidney disease) stage 3, GFR 30-59 ml/min (HCC) Acute on chronic renal insufficiency.  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  He will go to [facility] for plasma exchange    Diastolic CHF, acute (HCC) Follow up with cardiology Patient is on Lasix  20 mg daily. PRN leg edema  Encounter for antineoplastic chemotherapy Will hold off treatment pending plasma exchange at [facility].   No orders of the defined types were placed in this encounter.  Follow-up TBD   We spent sufficient time to discuss many aspect of care, questions were answered to patient's satisfaction.    Call Babara, MD, PhD [facility] Hematology Oncology 09/11/2024     HISTORY OF PRESENTING ILLNESS:  Victor Phillips is a  68 y.o.  male with PMH listed below who was referred to me for anemia  02/11/2023 - 02/14/2023 recent hospitalization due to pneumonia,  respiratory failure.  He was found to have a hemoglobin decreased at 6.8, status post PRBC transfusion during admission.  EGD showed gastritis.  Colonoscopy was not remarkable. 02/11/2023 TIBC 221 ferritin 107, iron saturation 16. Patient is currently taking fusion plus Vitamin B12 level in the 300s. His echo showed grade 2 diastolic CHF.  He denies recent chest pain on exertion, shortness of breath on  minimal exertion, pre-syncopal episodes, or palpitations He had not noticed any recent bleeding such as epistaxis, hematuria or hematochezia.  He denies over the counter NSAID ingestion.  Oncology History  Multiple myeloma (HCC)  04/14/2023 Initial Diagnosis   Multiple myeloma   03/13/2020 multiple myeloma panel showed M protein 4.3, IgA lambda Free lamda Level 142,-light chain ratio 0.07 04/05/2023 bone marrow biopsy showed hypercellular bone marrow with plasma cell neoplasm.The bone marrow is hypercellular for age with prominent increase in plasma cells representing 65% of all cells in the aspirate associated with interstitial infiltrates and numerous variably sized aggregates in  the clot and biopsy sections.  The plasma cells display lambda light chain restriction consistent with plasma cell neoplasm.  Normal cytogenetics.  FISH studies pending.    04/14/2023 Cancer Staging   Staging form: Plasma Cell Myeloma and Plasma Cell Disorders, AJCC 8th Edition - Clinical stage from 04/14/2023: RISS Stage II (Beta-2 -microglobulin (mg/L): 8.4, Albumin (g/dL): 2.6, ISS: Stage III, High-risk cytogenetics: Absent, LDH: Normal) - Signed by Babara Call, MD on 04/22/2023 Stage prefix: Initial diagnosis Beta 2 microglobulin range (mg/L): Greater than or equal to 5.5 Albumin range (g/dL): Less than 3.5 Cytogenetics: 1q addition, Other mutation   04/14/2023 Imaging   Skeletal survey 1. No lytic lesions or other intrinsic bony abnormality. 2. Borderline cardiomegaly with an interval decrease in size. 3. Diffuse atheromatous arterial calcifications including bilateral carotid artery calcifications, right greater than left   04/22/2023 - 11/11/2023 Chemotherapy   Patient is on Treatment Plan : MYELOMA NEWLY DIAGNOSED TRANSPLANT CANDIDATE DaraVRd (Daratumumab  SQ) q21d     04/25/2023 Imaging   PET scan showed  1. Two tiny hypermetabolic lucent lesions in the left posteromedial eighth and ninth ribs. No additional evidence  of multiple myeloma. 2. Aortic atherosclerosis (ICD10-I70.0). Coronary artery calcification.   07/14/2023 - 07/19/2023 Hospital Admission   Hospitalized due to ileus.    07/25/2023 Bone Marrow Biopsy   A-C. Peripheral blood and bone marrow, (peripheral smear, aspirate smear, touch preparation, clot section, core biopsy):   Persistent plasma cell myeloma, 50-60% lambda restricted plasma cells on core biopsy. Hypercellular bone marrow (70%) with trilineage hematopoiesis. Negative for increased blasts. Negative for significant dysplasia. Negative for amyloid deposition.   01/25/2024 - 03/28/2024 Chemotherapy   Patient is on Treatment Plan : MYELOMA RELAPSED/ REFRACTORY Carfilzomib  D1,8,15 (20/27) + selinexor   + Dexamethasone  (KPd) q28d     09/11/2024 -  Chemotherapy   Patient is on Treatment Plan : MYELOMA MAINTENANCE Bortezomib  SQ D1,8,15 + Dexamethasone  D1,8,15 q21d x 6 cycles       A Fib. on Eliquis  5mg  BID, follows up with cardiology.  Patient reports being compliant on Eliquis .  Denies any hematochezia or melena. Patient is off Lasix  20 mg for most days, only uses PRN leg edema.  Denies fever, chills, diarrhea, chest pain, nausea vomiting, SOB, leg swelling. . + fatigue. Appetite is fair.   MEDICAL HISTORY:  Past Medical History:  Diagnosis Date   Atrial fibrillation (HCC)    Diabetes mellitus without complication (HCC)    Hypercholesteremia    Hypertension    Multiple  myeloma not having achieved remission (HCC)     SURGICAL HISTORY: Past Surgical History:  Procedure Laterality Date   CARDIOVERSION N/A 10/17/2023   Procedure: CARDIOVERSION;  Surgeon: Darliss Rogue, MD;  Location: [facility] ORS;  Service: Cardiovascular;  Laterality: N/A;   COLONOSCOPY N/A 02/13/2023   Procedure: COLONOSCOPY;  Surgeon: Toledo, Ladell POUR, MD;  Location: [facility] ENDOSCOPY;  Service: Gastroenterology;  Laterality: N/A;   COLONOSCOPY N/A 02/14/2023   Procedure: COLONOSCOPY;  Surgeon: Toledo, Ladell POUR,  MD;  Location: [facility] ENDOSCOPY;  Service: Gastroenterology;  Laterality: N/A;   ESOPHAGOGASTRODUODENOSCOPY N/A 02/13/2023   Procedure: ESOPHAGOGASTRODUODENOSCOPY (EGD);  Surgeon: Toledo, Ladell POUR, MD;  Location: [facility] ENDOSCOPY;  Service: Gastroenterology;  Laterality: N/A;   IR BONE MARROW BIOPSY & ASPIRATION  11/24/2023    SOCIAL HISTORY: Social History   Socioeconomic History   Marital status: Married    Spouse name: Not on file   Number of children: Not on file   Years of education: Not on file   Highest education level: Not on file  Occupational History   Not on file  Tobacco Use   Smoking status: Never   Smokeless tobacco: Never  Vaping Use   Vaping status: Never Used  Substance and Sexual Activity   Alcohol use: Not Currently    Comment: quit approx 2001   Drug use: No   Sexual activity: Not on file  Other Topics Concern   Not on file  Social History Narrative   Not on file   Social Drivers of Health   Tobacco Use: Low Risk (09/11/2024)   Patient History    Smoking Tobacco Use: Never    Smokeless Tobacco Use: Never    Passive Exposure: Not on file  Recent Concern: Tobacco Use - High Risk (09/03/2024)   Received from [facility]   Patient History    Smoking Tobacco Use: Never    Smokeless Tobacco Use: Current    Passive Exposure: Not on file  Financial Resource Strain: Low Risk  (08/14/2024)   Received from [facility]   Overall Financial Resource Strain (CARDIA)    Difficulty of Paying Living Expenses: Not hard at all  Food Insecurity: No Food Insecurity (08/14/2024)   Received from [facility]   Epic    Within the past 12 months, you worried that your food would run out before you got the money to buy more.: Never true    Within the past 12 months, the food you bought just didn't last and you didn't have money to get more.: Never true  Transportation Needs: No Transportation Needs (08/14/2024)   Received  from [facility] - Transportation    In the past 12 months, has lack of transportation kept you from medical appointments or from getting medications?: No    Lack of Transportation (Non-Medical): No  Physical Activity: Insufficiently Active (10/12/2023)   Received from [facility]   Exercise Vital Sign    On average, how many days per week do you engage in moderate to strenuous exercise (like a brisk walk)?: 7 days    On average, how many minutes do you engage in exercise at this level?: 10 min  Stress: No Stress Concern Present (10/12/2023)   Received from [facility] - Occupational Stress Questionnaire    Feeling of Stress : Not at all  Recent Concern: Stress - Stress Concern Present (10/04/2023)  Received from [facility]   [employer] [facility] - Occupational Stress Questionnaire    Feeling of Stress : Rather much  Social Connections: Moderately Integrated (10/12/2023)   Received from [facility]   Social Connection and Isolation Panel    In a typical week, how many times do you talk on the phone with family, friends, or neighbors?: More than three times a week    How often do you get together with friends or relatives?: More than three times a week    How often do you attend church or religious services?: 1 to 4 times per year    Do you belong to any clubs or organizations such as church groups, unions, fraternal or athletic groups, or school groups?: No    How often do you attend meetings of the clubs or organizations you belong to?: Never    Are you married, widowed, divorced, separated, never married, or living with a partner?: Married  Intimate Partner Violence: Not At Risk (07/14/2023)   Humiliation, Afraid, Rape, and Kick questionnaire    Fear of Current or Ex-Partner: No    Emotionally Abused: No    Physically Abused:  No    Sexually Abused: No  Depression (PHQ2-9): Low Risk (09/11/2024)   Depression (PHQ2-9)    PHQ-2 Score: 0  Alcohol Screen: Not on file  Housing: Low Risk  (08/14/2024)   Received from [facility]   Epic    In the last 12 months, was there a time when you were not able to pay the mortgage or rent on time?: No    In the past 12 months, how many times have you moved where you were living?: 0    At any time in the past 12 months, were you homeless or living in a shelter (including now)?: No  Utilities: Not At Risk (08/14/2024)   Received from [facility]   Epic    In the past 12 months has the electric, gas, oil, or water  company threatened to shut off services in your home?: No  Health Literacy: Adequate Health Literacy (01/13/2024)   B1300 Health Literacy    Frequency of need for help with medical instructions: Rarely    FAMILY HISTORY: Family History  Problem Relation Age of Onset   Diabetes Mother    Heart attack Father     ALLERGIES:  has no known allergies.  MEDICATIONS:  Current Outpatient Medications  Medication Sig Dispense Refill   acetaminophen  (TYLENOL ) 325 MG tablet Take 2 tablets (650 mg total) by mouth every 6 (six) hours as needed for mild pain (or Fever >/= 101). 90 tablet 1   acyclovir  (ZOVIRAX ) 400 MG tablet Take 1 tablet by mouth twice daily 180 tablet 0   amiodarone  (PACERONE ) 200 MG tablet Take 1 tablet (200 mg total) by mouth daily. 90 tablet 2   apixaban  (ELIQUIS ) 5 MG TABS tablet Take 1 tablet (5 mg total) by mouth 2 (two) times daily. 60 tablet 5   atorvastatin  (LIPITOR) 10 MG tablet Take 10 mg by mouth daily.     calcium  carbonate (OS-CAL) 600 MG TABS tablet Take 2 tablets (1,200 mg total) by mouth daily.     cholecalciferol (VITAMIN D3) 25 MCG (1000 UNIT) tablet Take 1 tablet (1,000 Units total) by mouth daily.     cyanocobalamin  (VITAMIN B12) 1000 MCG tablet Take 1 tablet (1,000 mcg total) by mouth daily.      empagliflozin  (JARDIANCE )  10 MG TABS tablet Take 1 tablet (10 mg total) by mouth daily before breakfast. 30 tablet 2   furosemide  (LASIX ) 20 MG tablet Take 1 tablet (20 mg total) by mouth daily.     glipiZIDE (GLUCOTROL XL) 2.5 MG 24 hr tablet Take 5 mg by mouth daily with breakfast.     lidocaine  (LIDODERM ) 5 % Place 1 patch onto the skin daily. Remove & Discard patch within 12 hours or as directed by MD 30 patch 0   loperamide  (IMODIUM ) 2 MG capsule Take 1 capsule (2 mg total) by mouth See admin instructions. With onset of diarrhea, take 4 mg,then 2 mg every 2 hours or after every loose bowel movements  maximum: 16 mg/day 60 capsule 2   metoprolol  succinate (TOPROL -XL) 25 MG 24 hr tablet Take 0.5 tablets (12.5 mg total) by mouth daily. 45 tablet 1   Multiple Vitamin (MULTIVITAMIN WITH MINERALS) TABS tablet Take 1 tablet by mouth daily. 90 tablet 1   ondansetron  (ZOFRAN ) 8 MG tablet Take 1 tablet (8 mg total) by mouth every 8 (eight) hours as needed for nausea or vomiting. 90 tablet 1   oxyCODONE  (ROXICODONE ) 5 MG immediate release tablet Take 1 tablet (5 mg total) by mouth every 8 (eight) hours as needed for up to 6 doses. 6 tablet 0   pantoprazole  (PROTONIX ) 40 MG tablet Take 1 tablet (40 mg total) by mouth daily. 30 tablet 1   predniSONE (DELTASONE) 10 MG tablet Take 10 mg by mouth daily.     prochlorperazine  (COMPAZINE ) 10 MG tablet Take 1 tablet (10 mg total) by mouth every 6 (six) hours as needed for nausea or vomiting. 90 tablet 1   sacubitril -valsartan  (ENTRESTO ) 24-26 MG Take 1 tablet by mouth 2 (two) times daily. 60 tablet 4   spironolactone  (ALDACTONE ) 25 MG tablet Take 0.5 tablets (12.5 mg total) by mouth daily. 45 tablet 1   temazepam (RESTORIL) 15 MG capsule Take 15 mg by mouth at bedtime.     No current facility-administered medications for this visit.    Review of Systems  Constitutional:  Positive for fatigue. Negative for appetite change, chills, fever and unexpected weight  change.  HENT:   Negative for hearing loss and voice change.   Eyes:  Negative for eye problems and icterus.  Respiratory:  Negative for chest tightness, cough and shortness of breath.   Cardiovascular:  Negative for chest pain and leg swelling.  Gastrointestinal:  Negative for abdominal distention, abdominal pain and diarrhea.  Endocrine: Negative for hot flashes.  Genitourinary:  Negative for difficulty urinating, dysuria and frequency.   Musculoskeletal:  Negative for arthralgias.  Skin:  Negative for itching and rash.  Neurological:  Negative for light-headedness and numbness.  Hematological:  Negative for adenopathy. Does not bruise/bleed easily.  Psychiatric/Behavioral:  Positive for sleep disturbance. Negative for confusion.     PHYSICAL EXAMINATION: Vitals:   09/11/24 0837  BP: 130/67  Pulse: 75  Temp: 98.1 F (36.7 C)  SpO2: 98%   Filed Weights   09/11/24 0837  Weight: 161 lb (73 kg)    Physical Exam Constitutional:      General: He is not in acute distress.    Appearance: He is obese.  HENT:     Head: Normocephalic and atraumatic.     Mouth/Throat:     Comments: Dry oral mucosa  Eyes:     General: No scleral icterus. Cardiovascular:     Rate and Rhythm: Normal rate.  Pulmonary:  Effort: Pulmonary effort is normal. No respiratory distress.     Breath sounds: No wheezing.  Abdominal:     General: Bowel sounds are normal. There is no distension.     Palpations: Abdomen is soft.  Musculoskeletal:        General: Normal range of motion.     Cervical back: Normal range of motion and neck supple.     Right lower leg: No edema.     Left lower leg: No edema.  Skin:    General: Skin is warm and dry.     Findings: No erythema or rash.  Neurological:     Mental Status: He is alert and oriented to person, place, and time. Mental status is at baseline.     Cranial Nerves: No cranial nerve deficit.  Psychiatric:        Mood and Affect: Mood normal.       LABORATORY DATA:  I have reviewed the data as listed    Latest Ref Rng & Units 09/11/2024    8:21 AM 09/02/2024    2:08 PM 08/22/2024    2:51 PM  CBC  WBC 4.0 - 10.5 K/uL 4.8  4.4  5.9   Hemoglobin 13.0 - 17.0 g/dL 8.1  5.8  8.4   Hematocrit 39.0 - 52.0 % 25.3  19.1  26.3   Platelets 150 - 400 K/uL 77  105  232       Latest Ref Rng & Units 09/11/2024    8:21 AM 09/02/2024    2:08 PM 08/22/2024    2:51 PM  CMP  Glucose 70 - 99 mg/dL 792  884  864   BUN 8 - 23 mg/dL 45  33  31   Creatinine 0.61 - 1.24 mg/dL 5.65  8.18  8.34   Sodium 135 - 145 mmol/L 133  139  135   Potassium 3.5 - 5.1 mmol/L 4.3  4.5  4.8   Chloride 98 - 111 mmol/L 98  106  99   CO2 22 - 32 mmol/L 16  18  21    Calcium  8.9 - 10.3 mg/dL 7.7  7.3  89.9   Total Protein 6.5 - 8.1 g/dL 88.6   88.2   Total Bilirubin 0.0 - 1.2 mg/dL 0.4   0.3   Alkaline Phos 38 - 126 U/L 133   139   AST 15 - 41 U/L 53   44   ALT 0 - 44 U/L 23   17    Lab Results  Component Value Date   IRON 70 03/28/2024   TIBC 304 03/28/2024   IRONPCTSAT 23 03/28/2024   FERRITIN 535 (H) 03/28/2024     RADIOGRAPHIC STUDIES: I have personally reviewed the radiological images as listed and agreed with the findings in the report. CT PELVIS WO CONTRAST Result Date: 09/02/2024 CLINICAL DATA:  Pelvic fracture. EXAM: CT PELVIS WITHOUT CONTRAST TECHNIQUE: Multidetector CT imaging of the pelvis was performed following the standard protocol without intravenous contrast. RADIATION DOSE REDUCTION: This exam was performed according to the departmental dose-optimization program which includes automated exposure control, adjustment of the mA and/or kV according to patient size and/or use of iterative reconstruction technique. COMPARISON:  Radiograph dated 09/02/2024. CT abdomen pelvis dated 07/14/2023. FINDINGS: Urinary Tract:  No abnormality visualized. Bowel:  Unremarkable visualized pelvic bowel loops. Vascular/Lymphatic: Mild atherosclerotic  calcification. No pelvic adenopathy. Reproductive: The prostate and seminal vesicles are grossly remarkable. Other:  Small fat containing right inguinal hernia. Musculoskeletal: Degenerative changes of the  visualized lumbar spine. No acute osseous pathology. IMPRESSION: 1. No acute osseous pathology. 2. Small fat containing right inguinal hernia. 3.  Aortic Atherosclerosis (ICD10-I70.0). Electronically Signed   By: Vanetta Chou M.D.   On: 09/02/2024 14:10   DG Hip Unilat W or Wo Pelvis 2-3 Views Right Result Date: 09/02/2024 EXAM: 2 OR MORE VIEW(S) XRAY OF THE PELVIS AND RIGHT HIP 09/02/2024 11:15:00 AM COMPARISON: None available. CLINICAL HISTORY: pain FINDINGS: BONES AND JOINTS: SI joints are symmetric. No acute fracture. Bilateral hips demonstrate normal alignment. Mild bilateral hip degenerative joint disease is present. LUMBAR SPINE: Degenerative disc disease is noted at the L4-5 level. SOFT TISSUES: Extensive iliofemoral arterial calcifications are present. IMPRESSION: 1. No acute findings. 2. Mild bilateral hip degenerative joint disease. Electronically signed by: Waddell Calk MD 09/02/2024 11:35 AM EST RP Workstation: [id number]     "

## 2024-09-11 NOTE — Telephone Encounter (Signed)
 Dr. Babara has talked to pt regarding going to [facility] ER.

## 2024-09-11 NOTE — Assessment & Plan Note (Addendum)
 Acute on chronic renal insufficiency.  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  He will go to [facility] for plasma exchange

## 2024-09-11 NOTE — Assessment & Plan Note (Signed)
 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  Lab Results  Component Value Date   HGB 8.1 (L) 09/11/2024   TIBC 304 03/28/2024   IRONPCTSAT 23 03/28/2024   FERRITIN 535 (H) 03/28/2024    There is plan for blood transfusion tomorrow.  Given that he is going to [facility] ER for plasma exchange, anticipate hemoglobin will be monitored and blood transfusion to be given as needed at [facility].

## 2024-09-11 NOTE — Telephone Encounter (Signed)
 Patient called triage and requested the address to [facility] ER, so he could provide it to his sister. Address provided to patient.

## 2024-09-11 NOTE — Assessment & Plan Note (Signed)
 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    07/05/2024 cycle 2 Selinexor  to 40mg , with Pomalyst  2 mg day 1-21, and dexamethasone  20 mg weekly  Cycle 3 was not given due to cytopenia.  His disease appears to be refractory to multiple lines of treatment as evidenced by rapid progression He was seen by [facility] oncology Dr. Marea.  I discussed the case with him.  Original plan is to bridge him with Velcade  and selinexor  to [facility] treatment. However today's blood work showed acute worse of kidney function, creatinine increased to 4.  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  Discussed with Dr. Marea.  We recommend patient to go to [facility] ER for plasma exchange.  Patient was called earlier by my nurse and expressed the transportation difficulties.  I called the patient again later and he is getting some transportation help to go to [facility] ER.   Continue Acyclovir ,  on Eliquis  5mg  BID for A fib

## 2024-09-12 ENCOUNTER — Inpatient Hospital Stay

## 2024-09-12 ENCOUNTER — Inpatient Hospital Stay: Admitting: Oncology

## 2024-09-12 LAB — BPAM RBC
Blood Product Expiration Date: 202601012359
Unit Type and Rh: 9500

## 2024-09-12 LAB — TYPE AND SCREEN
ABO/RH(D): B POS
Antibody Screen: NEGATIVE
Unit division: 0

## 2024-09-12 LAB — KAPPA/LAMBDA LIGHT CHAINS
Kappa free light chain: 5.3 mg/L (ref 3.3–19.4)
Kappa, lambda light chain ratio: 0 — ABNORMAL LOW (ref 0.26–1.65)
Lambda free light chains: 4512.8 mg/L — ABNORMAL HIGH (ref 5.7–26.3)

## 2024-09-12 LAB — PREPARE RBC (CROSSMATCH)

## 2024-09-14 ENCOUNTER — Ambulatory Visit: Payer: Self-pay | Admitting: Oncology

## 2024-09-14 LAB — MULTIPLE MYELOMA PANEL, SERUM
Albumin SerPl Elph-Mcnc: 2.8 g/dL — ABNORMAL LOW (ref 2.9–4.4)
Albumin/Glob SerPl: 0.4 — ABNORMAL LOW (ref 0.7–1.7)
Alpha 1: 0.4 g/dL (ref 0.0–0.4)
Alpha2 Glob SerPl Elph-Mcnc: 0.8 g/dL (ref 0.4–1.0)
B-Globulin SerPl Elph-Mcnc: 1 g/dL (ref 0.7–1.3)
Gamma Glob SerPl Elph-Mcnc: 5.6 g/dL — ABNORMAL HIGH (ref 0.4–1.8)
Globulin, Total: 7.8 g/dL — ABNORMAL HIGH (ref 2.2–3.9)
IgA: >6400 mg/dL — ABNORMAL HIGH (ref 61–437)
IgG (Immunoglobin G), Serum: 283 mg/dL — ABNORMAL LOW (ref 603–1613)
IgM (Immunoglobulin M), Srm: <5 mg/dL — ABNORMAL LOW (ref 20–172)
M Protein SerPl Elph-Mcnc: 3.2 g/dL — ABNORMAL HIGH
Total Protein ELP: 10.6 g/dL — ABNORMAL HIGH (ref 6.0–8.5)

## 2024-09-18 ENCOUNTER — Inpatient Hospital Stay

## 2024-09-19 ENCOUNTER — Inpatient Hospital Stay

## 2024-09-19 ENCOUNTER — Inpatient Hospital Stay: Admitting: Oncology

## 2024-09-23 NOTE — Progress Notes (Signed)
 "  Nephrology Consult Progress Note  Interval History    Patient feeling better.   HD yesterday. No concerns   Dialysis Treatments   Current Hemodialysis Prescription: Dialyzer: [facility] D[facility] Bath: 2K with 3Ca Na: Sodium: 140 HCO3: Bicarbonate: 35 Dialysis Access:   BFR: Blood Flow Rate (+/- 50 mL/min): 400 DFR: Dialysate Flow Rate (mL/min): 600 mL/min Heparin : Not ordered Pre-treatment weight: Pre-Treatment Weight (kg): 86.6 Estimated Dry Weight:   Goal Patient Fluid Removal: Patient Fluid Removal Programmed (liters): 3.2  Pre-Treatment Weight (kg): 86.6 Post-Treatment Weight (kg): 83.2 UF volume: -0.5L  Medications    acyclovir  (ZOVIRAX ) oral solid  200 mg Oral Q12H SCH   [Held by provider] apixaban   5 mg Oral Q12H SCH   [Held by provider] calcium  carbonate  600 mg of elemental Oral Daily   cholecalciferol  1,000 Units Oral Daily   cyanocobalamin   100 mcg Oral Daily   cycloPHOSphamide (CYTOXAN) 500 mg in sodium chloride  0.9% 275 mL chemo infusion  500 mg Intravenous Q7 Days   [START ON 09/26/2024] dexAMETHasone   40 mg Oral Q7 Days   [Held by provider] empagliflozin   10 mg Oral Daily before breakfast   heparin  (porcine)   Intracatheter As Directed Admin   melatonin  3 mg Oral QHS   [Held by provider] metoprolol  TARTrate  75 mg Oral BID   ondansetron   16 mg Via Tube Q7 Days   pantoprazole  (PROTONIX ) IV  40 mg Intravenous Q12H SCH   sevelamer carbonate  2,400 mg Oral TID CC    Objective     Current Vital Signs 24h Vital Sign Ranges  T 36.4 C (97.5 F) Temp  Avg: 36.5 C (97.7 F)  Min: 36.4 C (97.5 F)  Max: 36.6 C (97.9 F)  BP 118/59 BP  Min: 110/51  Max: 156/60  HR 75 Pulse  Avg: 75.9  Min: 69  Max: 86  RR 18 Resp  Avg: 16.4  Min: 12  Max: 21  SaO2 98 % Nasal cannula SpO2  Avg: 97.3 %  Min: 95 %  Max: 99 %          Ins & Outs I/O last 2 completed shifts: In: 317.9 [Blood:317.9] Out: 3000 [Other:3000] Current Shift:  01/11 0701 -  01/11 1900 In: 396.6 [P.O.:200] Out: -    Physical Exam Gen:   NAD CV:   RRR, no extra heart sounds Pulm:   normal WOB, CTAB Abd:   soft, NTND MSK:   Pitting edema 2-3+ in LE Access: RIJ PC    Dialysis Catheters/Urinary Catheters   Dialysis Catheter Triple Lumen Non Tunneled/Temporary Right Internal jugular (Active)  Placement Placement Date/POA (If unknown enter date of admission)/Time: 09/12/24 1200   LDA present on assessment/arrival: Yes  Placed at: Inpatient Unit  Orientation: Right  Access Location: Internal jugular  Fill Volume Lumen 1: 1.2 ml  Fill Volume ...  Number of days: 5     [REMOVED] Dialysis/Apheresis Catheter - Non Tunneled/Temporary Right Internal jugular (Removed)  Removal Date: 09/14/24  Placement Placement Date/POA (If unknown enter date of admission): 09/12/24   LDA present on assessment/arrival: Yes  Placed at: Inpatient Unit  Orientation: Right  Access Location: Internal jugular  Fill Volume Lumen 1: 1.2 ml...  Number of days: 2       Dialysis Catheter Triple Lumen Non Tunneled/Temporary Right Internal jugular (Active)  Placement Placement Date/POA (If unknown enter date of admission)/Time: 09/12/24 1200   LDA present on assessment/arrival: Yes  Placed at: Inpatient Unit  Orientation: Right  Access Location: Internal jugular  Fill Volume Lumen 1: 1.2 ml  Fill Volume ...  Number of days: 5    Invasive Urinary Catheters   Urethral Catheter Indwelling (Active)  Placement Placement Date/POA/Time: 09/13/24 1022   Placed at: Inpatient Unit  Insertion bundle used?: Yes  Difficult insertion: No  Catheter Type: Indwelling  Tube Size (Fr.): 16 french  Catheter Balloon Size: 10 mL  Procedure Tolerance: Tolerated Wel...  Number of days: 4    Labs     Recent Labs  Lab 09/21/24 0457 09/22/24 0404 09/23/24 0634  WBC 3.6 3.4 3.9  HGB 7.6* 7.5* 8.3*  PLT 39* 35* 37*   Recent Labs  Lab 09/21/24 0457 09/21/24 1622 09/22/24 0802 09/23/24 0634  NA 135 132*  130* 130*  K 4.4 5.0  --  4.0  CL 98 97* 95* 96*  CO2 26 23 20* 25  BUN 16 21* 26* 18  CREATININE 3.8* 4.4* 5.0* 4.1*  CALCIUM  7.6* 7.3* 7.0* 7.5*  ALKPHOS 51  --  58 61  GLUCOSE 87 80 70 83   Lab Results  Component Value Date   CALCIUM  7.5 (L) 09/23/2024   CAION 0.98 (L) 09/23/2024   PHOS 3.6 09/23/2024   Lab Results  Component Value Date   IRON 92 03/24/2023   TIBC 247.4 (L) 03/24/2023   FERRITIN 237 03/03/2023     Assessment and Recommendations  Victor Phillips is a 69 y.o. male with history of IgA lambda multiple myeloma, HFrEF (EF 30%), HTN, type II diabetes, normal baseline kidney function who presented on 12/30 from outpatient oncology clinic from PLEX therapy in setting of AKI concerning for light chain nephrology. Nephrology is consulted for AKI.    Baseline Cr 1-1.2 mg/dL with admission Cr 4.7 (up from 2.2 on 12/22) up to 7.0 mg/dL (1/2)  AKI on presentation most likely 2/2 known light chain cast nephropathy and progression of his IgA lambda multiple myeloma. UACR 1192 mg/g and UPCR 7667 mg/g, interval rise in serum free kappa light chain (230 --> 567 mg/dL).Started on PLEX therapy (given IgA >6000) on 12/31 and CyborD on 12/31 with subsequent progression of AKI with associated hyperphosphatemia, hyperuricemia, hyperkalemia, and hypocalcemia clinically concerning for TLS.    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 Continue with TTS iHD. No signs of renal recovery  MM treatment per oncology colleagues  Access: RIJ PC  Standard Recommendations   Please dose all medications for reduced eGFR. Avoid NSAIDs, morphine , and baclofen. Avoid magnesium -containing laxatives (milk of magnesia, magnesium  hydroxide) and enemas (SMOG). Avoid phosphate-containing laxative (sodium phosphate ) enemas (Fleet  phospho-soda). Use caution when administering iodinated contrast with eGFR < 30  Thank you for this consult. We will continue to follow the patient with you.  Please feel free to call with questions or concerns.   Victor PATSY KINGSLEY, MD  Floor service pager [phone]   The Nephrology Consult team is available in-house from 7A-5P. If urgent assistance is needed outside of these hours, please page the Nephrology on-call pager [phone] for assistance. The Nephrology consult pagers are available after business hours for emergencies only.    ------------------------------------------------------------------------------- Attestation signed by Victor Phillips, Victor Paling, MD at 09/23/2024 10:31 PM Attestation Statement:   I personally saw and evaluated the patient, and participated in the management and treatment plan as documented in the resident/fellow  note.  Patient had HD yesterday w/ 3L UF. Tolerated well.  Remains anuric, O2 requirement stable. Electrolytes wnl.  There is no acute indication for renal replacement therapy at this time based on today's evaluation.  Will plan for HD on Tuesday.  The Nephrology team will continue to monitor renal function closely   Victor CATALINA Victor BRANDS, MD  ------------------------------------------------------------------------------- "

## 2024-09-24 ENCOUNTER — Other Ambulatory Visit: Payer: Self-pay

## 2024-09-24 DIAGNOSIS — C9000 Multiple myeloma not having achieved remission: Secondary | ICD-10-CM

## 2024-09-24 NOTE — Progress Notes (Signed)
 " Physical Therapy Progress Note  Patient Name:  Victor Phillips Date of Therapy Session: 09/24/24 Time of Therapy Session:  1002 Duration of Session:  23 Minutes Room/Bed: 9207/9207-01  Precautions: Falls Risk, Low hematocrit, Altered Mental Status   Assessment: Patient requires min to mod assist for all functional mobility.  While he is able to transfer with moderate assistance he has been unable to take steps with the walker.  The patient will continue to benefit from the skills of a physical therapist to address Decreased balance, Impaired functional mobility, Decreased strength, Gait/Ambulation limitations, Decreased transfers, Decreased bed mobility. Limiting factors to progression may include increased edema and tightness of bilateral LE, decreased strength, balance, stability, and endurance..    Recommendations for mobility with nursing:  Use BMAT score and associated clinical judgement to determine safe mobility on a daily basis as patient status may be subject to change.  Pt is cleared for stand pivot transfers with a RW and 2 person assist.  Discharge Recommendations: The physical therapy discharge recommendation is Skilled care (additional rehab) with PT to address Decreased balance, Impaired functional mobility, Decreased strength, Gait/Ambulation limitations, Decreased transfers, Decreased bed mobility.   Barriers to returning home include History of falls, Low activity tolerance, Decreased activity tolerance/medically complex/comorbidities, Poor insight into disability   If post-acute rehabilitation options are not available, family training is recommended and the patient will require the following to return home: ambulance transport, HHPT, and assistance with all functional mobility, negotiating stairs, ambulation, transfers, and bed mobility DME Recommendations    Flowsheet Row Most Recent Value  DME Recommendations Bathroom Equipment, Mobility Equipment Filed at 09/24/2024  1002  Bathroom Equipment 3-in-1 bedside commode Filed at 09/24/2024 1002  Mobility aid                                         Wheelchair Filed at 09/24/2024 1002       Wheelchair type Manual Filed at 09/24/2024 1002   Patient would benefit from a standard wheelchair (w/c) at home and demonstrates a mobility limitation that significantly impairs his/her ability to participate in MRADLs at home, which cannot be sufficiently resolved by the use of a fitted cane or walker. Use of the w/c on a regular basis in the home will significantly improve the patient's MRADL function and safety, the patient/family is/are willing and agreeable to using the w/c to improve MRADL function and safety at home, and the patient/family demonstrate the willingness and physical/mental capabilities to safely propel the w/c at home. Patient would benefit from the use of [facility] as this patient is confined to a single room.   Complete details of today's session:Chart reviewed and communication with nursing completed.  The patient was supine in bed when PT arrived.  Therapeutic activities and ambulation completed per flow.  Pt was sitting in bedside chair when we went into the room. Therapeutic exercises in chair to bilateral LE and UE. Sit to stand x 4 in total with min/mod assist with a RW. Attempted to take a few steps however the pt was unable to weightshift enough to step (although he did shuffle pivot tothe chair with nursing. B LE edema, tightness, pain , and numbness are interferring with ambulation Please see flow below for details.  At end of session, the patient was left seated in recliner at bedside, with all needs in reach, with nurse call device in reach, with  bed/chair alarm system engaged, with family present. His status was communicated to the RN, Patient.     09/24/24 1002  Discipline Timestamp  Discipline Timestamp PT  Documentation Type     Documentation Type                                E,R, T   Treatment   Patient Subjective Information  Patient Subjective Information Patient agreeable to therapy  Precautions  Precautions Falls Risk;Low hematocrit;Altered Mental Status  Patient/Family Goals  Patient/Family Goals Return to previous lifestyle;Take care of myself  Vital Signs  Heart Rate 78  Heart Rate Source Monitor  Pain Assessment  Pain Assessment %% 0-10  Pain Score %% Zero  Review of Systems Cardiovascular/Pulmonary Function  SpO2 97 %  Any supplemental oxygen? No  Review of Systems - Cognition                         Overall Cognitive Status Impaired/altered mental status  Arousal/Alertness Alert  Orientation X 4  Attention Span Attends with cues to redirect  Following Commands Follows two step commands;Consistently  Behavior Cooperative  Safety Judgment Patient at risk for falls;Decreased awareness of need for assistance;Decreased awareness of need for safety;Decreased awareness of deficits  Balance  Sitting/Standing Balance  Sitting Static;Standing Static;Standing Dynamic       Sitting Balance Surface Hospital Bed       Static Sitting Support Needed Feet supported       Static Sitting Balance Assist Required Close supervision       Static Standing-Balance Support Bilateral upper extremity support       Static Standing Balance Assist Required Contact guard       Dynamic Standing Assistance Minimal  Standing Assistive Devices Walker       Walker type Rolling  Mobility  Bed Mobility  Sit to Stand;Stand to Sit (Pt up in chair when PT/OT arrived.)       Sit to Stand Assistance Minimal assist (pt. performs > 75%);Moderate assist (pt. performs 50-75%)       Sit to Stand Details Requires extra time;Set up;Verbal cues;Management of lines and tubes       Number of People Required 1       Sit to Stand Assistive Devices Walker       Walker type Rolling       Stand to [employer] Minimal assist (pt. performs > 75%);Moderate assist (pt. performs 50-75%)       Stand to Sit Details  Requires extra time;Set up;Verbal cues;Management of lines and tubes       Number of People Required 1       Stand to Sit Assistive Devices Walker       Walker type [employer]  (Attempted to take a few steps how ever the pt was unable to shift weighed enough to step forward (although he shuffled during the pivot to the chair))  Stairs/Curb assessed No  AM-PAC 6 Clicks Basic Mobility Inpatient Short Form  Turning from your back to your side while in a flat bed without using bedrails? 3-A Little  Moving from lying on your back to sitting on the side of a flat bed without using bedrails? 2-A Lot  Moving to and from a bed to a chair (including a wheelchair)? 2-A Lot  Standing up from a chair using using your arms (e.g,. wheelchair, or bedside chair)?  3-A Little  To walk in hospital room? 2-A Lot  Climbing 3-5 steps with a railing? 1-Total  AM-PAC Basic Mobility Raw Score 13  AM-PAC Basic Mobility t-Scale Score 33.99  AM-PAC Basic Mobility G-Code Modifier CK  Patient Status At End of Session  Status Communicated to: RN;Patient  Pt Left: seated in recliner at bedside;with all needs in reach;with nurse call device in reach;with bed/chair alarm system engaged;with family present  Assessment  PT Enhancers Has needed equipment  PT Barriers History of falls;Low activity tolerance;Decreased activity tolerance/medically complex/comorbidities;Poor insight into disability  Impairments/Functional Limitations    Decreased balance;Impaired functional mobility;Decreased strength;Gait/Ambulation limitations;Decreased transfers;Decreased bed mobility  Rehab Potential   Good  Patient safe for DC/PT perspective? Yes (to SNF)  Summary of Findings  Tolerated treatment well;Patient is safe to transfer with nursing assistance  Patient Demonstrates Decreased Function Secondary to Medical status limitations;Decreased activity tolerance  Plan  Treatment/Interventions Bed mobility;Transfers;Therapeutic  exercise;Patient and family education;Neuromuscular reeducation;Gait training;Mobility training;Endurance training  PT Frequency 4x per week  Discharge Recommendation (DUH/DRH) Skilled care (additional rehab) with PT  DME Recommendations Bathroom Equipment;Mobility Biomedical Scientist type Manual  Plan(Progress Note) Continue current plan of care     Please see patient education record for PT teaching completed today.   Dusty Rippelmeyer, PT     "

## 2024-09-24 NOTE — Progress Notes (Signed)
 Per CM, patient needs Short term SNF bed. [place] Must PASRR number is [id number]. FL2 form completed; packet sent to [facility] Main facilities (except [facility]'s).SABRA Co-signature requested from Dr. Bunny for FL-2. Please update form prior to discharge, if needed.    Beverley Blender, BSN, RN, CCM UM Referral Specialist

## 2024-09-25 ENCOUNTER — Inpatient Hospital Stay

## 2024-09-26 ENCOUNTER — Inpatient Hospital Stay: Admitting: Oncology

## 2024-09-26 ENCOUNTER — Inpatient Hospital Stay

## 2024-10-06 NOTE — Discharge Summary (Signed)
 "  [facility] Discharge Summary  Admit Date: 09/11/2024 Discharge Date: 10/06/2024  Admitting Physician: Norleen Ali Pencil, DO Discharge Physician: Pencil Norleen Ali, DO  Primary Care Provider: Cleotilde Oneil Novel, MD, Phone [phone]  Discharge Destination: Skilled nursing facility: [facility] [place]   Admission Diagnoses:  Metabolic acidosis [E87.20] History of multiple myeloma [Z85.79] Acute kidney injury [N17.9]  Discharge Diagnoses:  Principal Problem:   Multiple myeloma not having achieved remission (CMS/HHS-HCC) Active Problems:   Essential hypertension   Hyperlipidemia, mixed   Controlled type 2 diabetes mellitus with microalbuminuria, without long-term current use of insulin  (CMS/HHS-HCC)   B12 deficiency   CKD (chronic kidney disease) stage 3, GFR 30-59 ml/min (CMS-HCC)   Diastolic CHF, acute (CMS/HHS-HCC)   Acute kidney injury superimposed on CKD   Anemia due to antineoplastic chemotherapy   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 *  Primary Diagnosis: Admitted for renal failure 2/2 multiple myeloma   Changes Made (with rationale):  Continue holding Eliquis  ISO thrombocytopenia  Continue calcitriol 0.25 mcg ISO renal failure Continue TUMS BID ISO renal failure  Continue holding glipizide and jardiance  given issues with hypoglycemia   To-Do List (incidental findings, follow-up studies, etc.): None  Anticipatory Guidance for Outpatient Care:  Follow up with Oncology for C2 CyBorD, weekly chemo infusions on Thursdays  Lab checks per Oncology  MWF HD (outpt start on 1/26)    Results Pending at Discharge:  None Please see phone numbers at end of this summary for lab contact information.   Follow-up/Care Transition Plan: Sched. appts: Future Appointments  Date Time Provider Department Center  10/11/2024  3:30 PM STUDIES/LAB-[facility]  [facility]  10/11/2024  4:30 PM HEM MALIG PROVIDER [facility]    Follow-up info: [facility] [place] [address] [place]  [id number] [phone]    Victor Marsa Fess, DO [address] [place] [address] [phone]  Follow up       Allergies/Intolerances:  No Known Allergies   New Adverse Drug Events: none  Medications:     Current Discharge Medication List     PAUSE taking these medications      Instructions  apixaban  5 mg tablet Wait to take this until your doctor or other care provider tells you to start again. Refills: 0  Commonly known as: ELIQUIS  Take 1 tablet (5 mg total) by mouth every 12 (twelve) hours       START taking these medications      Instructions  acyclovir  200 MG capsule Refills: 0 Replaces: acyclovir  400 MG tablet  Commonly known as: ZOVIRAX  Take 1 capsule (200 mg total) by mouth every 12 (twelve) hours Last time this was given: 200 mg on October 06, 2024  7:58 AM   calcitRIOL 0.25 MCG capsule Refills: 0  Commonly known as: ROCALTROL Take 1 capsule (0.25 mcg total) by mouth once daily Last time this was given: 0.25 mcg on October 06, 2024  7:58 AM   calcium  carbonate 300 mg (750 mg) chewable tablet Refills: 0  Commonly known as: TUMS E-X Take 2 tablets (1,500 mg of salt total) by mouth 2 (two) times daily Last time this was given: 1,500 mg of salt on October 06, 2024  7:59 AM   fluticasone propionate 50 mcg/actuation nasal spray Refills: 0  Commonly known as: FLONASE Place 1 spray into both nostrils once daily as needed (congesstion) Last  time this was given: 1 spray on October 05, 2024  8:11 AM   metoprolol  SUCCinate 25 MG XL tablet Refills: 0  Commonly known as: TOPROL -XL Take 1 tablet (25 mg total) by mouth once daily Last time this was given: 25 mg on October 06, 2024  7:58 AM   sennosides-docusate 8.6-50 mg tablet Refills: 0  Commonly known as: SENOKOT-S Take  2 tablets by mouth 2 (two) times daily Last time this was given: 2 tablets on October 06, 2024  8:00 AM       These medications have been CHANGED      Instructions  cyanocobalamin  100 MCG tablet Refills: 0 What changed:  medication strength how much to take when to take this additional instructions  Commonly known as: VITAMIN B12 Take 1 tablet (100 mcg total) by mouth once daily Last time this was given: 100 mcg on October 06, 2024  7:58 AM   pantoprazole  40 MG DR tablet Refills: 0 What changed: when to take this  Commonly known as: PROTONIX  Take 1 tablet (40 mg total) by mouth 2 (two) times daily before meals Last time this was given: 40 mg on October 06, 2024  7:59 AM   polyethylene glycol packet Refills: 0 What changed: Another medication with the same name was added. Make sure you understand how and when to take each.  Commonly known as: MIRALAX  Take 17 g by mouth once daily Last time this was given: Ask your nurse or doctor   polyethylene glycol packet Refills: 0 What changed: You were already taking a medication with the same name, and this prescription was added. Make sure you understand how and when to take each.  Commonly known as: MIRALAX  Take 1 packet (17 g total) by mouth once daily Mix in 4-8ounces of fluid prior to taking. Last time this was given: Ask your nurse or doctor       CONTINUE taking these medications      Instructions  acetaminophen  325 MG tablet Refills: 0  Commonly known as: TYLENOL  Take 650 mg by mouth every 6 (six) hours as needed Last time this was given: 975 mg on October 01, 2024  8:11 PM   atorvastatin  10 MG tablet Quantity: 90 tablet Refills: 3  Commonly known as: LIPITOR Take 1 tablet (10 mg total) by mouth once daily   empagliflozin  10 mg tablet Refills: 0  Commonly known as: JARDIANCE  Take 10 mg by mouth every morning before breakfast Last time this was given: 10 mg on October 06, 2024  7:58 AM   ondansetron  8 MG  tablet Refills: 0  Commonly known as: ZOFRAN  Take 8 mg by mouth every 8 (eight) hours as needed Last time this was given: 16 mg on October 04, 2024  8:56 AM   prochlorperazine  10 MG tablet Refills: 0  Commonly known as: COMPAZINE  Take 10 mg by mouth every 6 (six) hours as needed   temazepam 30 mg capsule Quantity: 30 capsule Refills: 5  Commonly known as: RESTORIL Take 1 capsule (30 mg total) by mouth at bedtime as needed for Sleep Last time this was given: 30 mg on October 05, 2024  8:49 PM       STOP taking these medications    acyclovir  400 MG tablet Commonly known as: ZOVIRAX  Replaced by: acyclovir  200 MG capsule   calcium  carbonate 600 mg calcium  (1,500 mg) Tab tablet   cholecalciferol 1000 unit tablet Commonly known as: VITAMIN D3   dexAMETHasone  4 MG tablet Commonly  known as: DECADRON    escitalopram oxalate 5 MG tablet Commonly known as: LEXAPRO   FUROsemide  20 MG tablet Commonly known as: LASIX    glipiZIDE 5 MG XL tablet Commonly known as: GLUCOTROL XL   loperamide  2 mg capsule Commonly known as: IMODIUM    metoprolol  TARTrate 50 MG tablet Commonly known as: LOPRESSOR    multivitamin with minerals tablet   predniSONE 10 MG tablet Commonly known as: DELTASONE   XPOVIO  60 mg/week (60 mg x 1) Tab Generic drug: selinexor        ASK your doctor about these medications      Instructions  oxyCODONE  5 MG immediate release tablet Quantity: 20 tablet Refills: 0 Ask about: Should I take this medication?  Commonly known as: ROXICODONE  Take 1 tablet (5 mg total) by mouth once daily as needed for Pain for up to 30 days Last time this was given: 2.5 mg on October 03, 2024  9:32 PM         Brief History of Present Illness: Victor Phillips is a 69 y.o. 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 He was evaluated by Nephrology and began HD which he continues. He additionally began therapy with CyBorD for his MM which he tolerated well and will continue further treatment as outpatient. On day of dc, patient is C2D3 CyBorD.  _____________________   Hospital Course by Problem:  High Risk IgA Multiple Myeloma, in Relapse Anemia and thrombocytopenia due to chemo  Initially diagnosed in 2024, s/p multiple lines of therapy, most recently on Selinexor  40mg  weekly, velcade  on d1,8,15, and dexamethasone  20mg  weekly. Patient reports not taking this since his last visit with his oncologist as he was advised to stop taking it with change in therapy. Outpatient plan to start BVd mid-January per last note from Dr. Marea (12/22) before the patient was admitted for acute worsening renal function ISO worsening myeloma. On admission, patient had hyperuricemia ISO AKI. Started CyBorD on 12/31, then developed hyperkalemia, hyperphosphatemia, and persistent hyperuricemia. Received a total of 60 g of Lokelma 20 mg IV Lasix , D5 + insulin  for hyperkalemia without resolution. Rasburicase given 1/1 with resolution of hyperuricemia. Given persistent electrolyte abnormalities and worsening renal function, decision made to pursue HD on 1/2 (see below). At d/c he is C2D3 CyBorD and will continue weekly reduced dose bortezomib  (1.0 mg/m2) as outpatient (scheduled for labs and next treatment at [facility] on 1/[facility]). He will continue ppx acyclovir . Plan for therapy as a bridge to elranatamab.    IgA Nephropathy Acute renal failure  CKD Stage 3 Metabolic acidosis  Patient with a history of CKD, baseline SCr around 1.1-1.3. He underwent routine labs at his local oncology office on 12/30 which showed increase in SCr from 2.2 on 12/22 to 4.3. He was advised to come to [facility] ED as this was likely driven by his multiple myeloma relapsing. Dr. Marea requested PLEX to stabilize patient while awaiting  next line of myeloma therapy (see above). Nephrology was following during admission. Urine albumin/Cr ratio significantly elevated c/f worsening renal function. Received Nabicarb IVF for metabolic acidosis until initiation of HD.  PLEX treatments 12/31 and 1/2. HD initiated 1/2. Received Sevelamer (1/1-1/12). Foley catheter removed on 1/10 as patient is oliguric and to decrease infection risk. Was undergoing HD T/Th/Sat but was transitioned to MWF on 1/19  as pt has chemo on Thurs and will continue on MWF HD as OP.   Electrolyte disturbances ISO kidney failure  Nephrology followed during admission and greatly appreciate their assistance. Maintained goal of Phos >2.5. He will continue TUMS 2 tablets BID, Protonix  BID, Calcitriol 0.25 mcg daily and Jardiance  10 mg daily (of note, per Nephrology, Jardiance  is safe to continue in patients with ESRD especially given indication for HFrEF).    HFrEF, EF30-35% HTN HLD Metop 50 mg BID was held ISO renal dysfunction, recent hypotension, and bradycardia. Initially held Jardiance  ISO renal failure, but later restarted on 1/21 per Nephrology guidance that medication is safe to use in ESRD. Continue metoprolol  succinate 25 mg daily (dos reduced ISO bradycardia).   Atrial Fibrillation  Noted on EKG in November 2024 which was new from previous EKG in July 2024. Most recent EKG on 1/22 with NSR and QTc 482. He will continue holding Eliquis  ISO thrombocytopenia.    T2DM Hypoglycemia  Last A1c 5.7 as of 09/11/24. SSI discontinued ISO hypoglycemia. He will continue home empagliflozin  as above. Held home glipizide during admission given recent hypoglycemia and he will continue HOLDING this medication at dc unless later instructed to restart by outpatient team.   Constipation No associated nausea or vomiting, and is passing flatus. He received Lactulose x 1 on 10/04/24 with improvement. He will continue Senokot 2 tabs BID.  Scrotal swelling  Improving per OT 1/20. He  can continue elevating scrotum while in bed or chair   Cancer Related Pain He can continue Tylenol  PRN    B12 Deficiency  Micronutrients He will continue D3 1000U daily and Vit B12 100 mcg daily   Insomnia He can continue scheduled Melatonin nightly and home Restoril 30mg  QHS PRN   Deconditioning PT/OT followed closely during admission and recommended SNF for additional PT/OT. He is being discharged to [facility] (SNF) for additional rehab.  Resolved  SBO vs ileus  Late 1/5 had several episodes of emesis. Had coffee ground emesis on 1/6. KUB c/f ileus. Placed NGT that same day for decompression. Improved on 1/8, removed NGT and advanced diet to CLD. Advanced diet to regular on 1/9    Hypotension C/f sepsis SBP 90s on 1/3 AM, resolved with 1u PRBCs. May have been 2/2 first HD the day prior in addition to PLEX. Patient remained asymptomatic with otherwise stable VS. Hypotension recurred 1/3 evening with SBP in 60-70s. The patient received a total of 1.5 L IVF as well as completed FFWU + emp IV abx with pressures improved.  Blood and urine cultures from 1/3 negative. Received Zosyn 1/3-1/6.    AMS Delirium Delirium overnight 1/3 requiring mitts to stop pt from pulling on apheresis line. May be 2/2 uremia, hospital delirium, intracranial bleed, CNS involvement of MM. On 1/9 he seems close to mental baseline, no issues w/ hyperactive delirium >24 hours CT head 1/3 unremarkable. VBG 1/4 unremarkable with improved lactate. Infectious workup was completed. On Zosyn 1/3-1/6. Discontinued with no source of infection and improvement in clinical status.    Indeterminate Quantiferon TB (09/27/24) Repeat ordered 1/19 in preparation for SNF discharge. Repeat Quant TB 1/19 negative     Social Drivers of Health with Concerns   Tobacco Use: Low Risk (09/11/2024)   Received from [facility]   Patient History    Smoking Tobacco Use: Never    Smokeless Tobacco Use: Never  Recent Concern:  Tobacco Use - High Risk (09/03/2024)   Patient History    Smoking Tobacco Use:  Never    Smokeless Tobacco Use: Current  Financial Resource Strain: High Risk (09/11/2024)   Overall Financial Resource Strain (CARDIA)    Difficulty of Paying Living Expenses: Very hard  Food Insecurity: Food Insecurity Present (09/11/2024)   Hunger Vital Sign    Worried About Running Out of Food in the Last Year: Often true    Ran Out of Food in the Last Year: Often true  Physical Activity: Insufficiently Active (10/12/2023)   Exercise Vital Sign    Days of Exercise per Week: 7 days    Minutes of Exercise per Session: 10 min  Stress: No Stress Concern Present (10/12/2023)   [employer] - Occupational Stress Questionnaire    Feeling of Stress : Not at all  Recent Concern: Stress - Stress Concern Present (10/04/2023)   [employer] - Occupational Stress Questionnaire    Feeling of Stress : Rather much  Depression: Not at risk (10/12/2023)   PHQ-2    PHQ-2 Score: 0  Recent Concern: Depression - Moderate depression (10/04/2023)   PHQ-9    PHQ-9 Score: 10    Surgeries and Procedures Performed: None   _____________________  Discharge Exam:  BP 121/65 (BP Location: Right upper arm, Patient Position: Sitting)   Pulse 82   Temp 36.8 C (98.2 F) (Oral)   Resp 19   Ht 170.2 cm (5' 7)   Wt 85.5 kg (188 lb 7.9 oz)   SpO2 98%   BMI 29.52 kg/m  O2 Device: Nasal cannula (09/20/24 2022)  GEN: Chronically-ill appearing male lying in bed comfortably in no acute distress HEENT: EOMI, MMM, OP clear, no mucositis appreciated NECK: Dialysis catheter in place with no surrounding erythema or tenderness at the site  LUNGS:  CTAB. On RA.  CV:  RRR, no M/R/G ABD: Soft, moderately distended, non-tender, bowel sounds active GU: Scrotal and penile swelling noted  EXT:  Warm, NT, no C/C/E. +1 BLE edema. SKIN:  No rash or lesions appreciated. Scattered  ecchymosis  NEURO:  A&Ox3, CN II-XII grossly intact, no gross motor deficits   Pertinent Lab Testing: Recent Labs  Lab 10/04/24 0534 10/05/24 0429 10/06/24 0721  NA 135 135 134*  K 3.8 4.3 4.2  CL 98 98 96*  CO2 27 25 28   BUN 25* 39* 27*  CREATININE 4.1* 5.6* 4.0*  GLUCOSE 169* 311* 100  CALCIUM  7.2* 7.1* 7.1*   Recent Labs  Lab 10/01/24 0535 10/03/24 0550 10/05/24 0429  AST 43* 32 35  ALT 34 25 30  ALKPHOS 125* 125* 136*  TBILI 0.5 0.8 0.5    Recent Labs  Lab 10/04/24 1516 10/05/24 0429 10/06/24 0721  WBC 1.3* 1.8* 1.6*  HGB 7.9* 7.3* 8.8*  HCT 24.8* 22.2* 26.2*  PLT 52* 45* 58*   No results for input(s): APTT, INR in the last 168 hours.   Other Pertinent Labs: None   Micro:  Lab Results  Component Value Date   BLDCULT No growth detected. 09/15/2024   BLDCULT No growth detected. 09/15/2024       Pertinent Imaging: None  _____________________  Code Status: DNAR Goals of care were addressed during this admission: patient desires to remain DNAR but continue treatment for his multiple myeloma.  Status on Discharge:  Current activity: Chairfast (10/06/24 0758) Current mobility: No limitation (10/06/24 0758)  Activity Recommendation: activity as tolerated  Other Discharge Instructions: Services setup at discharge: SNF PT/OT ([facility]) Tubes/lines at discharge: Central dialysis line  Diet:  Snacks Diet regular  Wound Care Order Instructions     None       _____________________  Time spent on discharge process: >30 minutes    ASHLEY JORDAN SCOTT, NP [facility]  10/06/2024   Hospital Contact Information:  [facility]) [facility]) [facility])  Pending tests:  Laboratory: [phone] Microbiology: [phone] Pathology: [phone] Radiology: [phone]  General questions: [phone] Pending tests: Laboratory: [phone] Microbiology: [phone] Pathology: [phone] Radiology: [phone]  General questions:  [phone] Pending tests:  Laboratory: [phone] Microbiology: [phone] Pathology: [phone] Radiology: [phone]  General questions:  [phone] Pending tests: Laboratory: [phone]) [phone] Microbiology: [phone] Pathology: [phone] Radiology: [phone]  General questions:  [phone]    "

## 2024-10-09 ENCOUNTER — Other Ambulatory Visit: Payer: Self-pay | Admitting: Oncology

## 2024-10-16 ENCOUNTER — Ambulatory Visit

## 2024-11-13 ENCOUNTER — Ambulatory Visit: Admitting: Medical
